# Patient Record
Sex: Female | Born: 1965 | Race: White | Hispanic: No | Marital: Married | Smoking: Never smoker
Health system: Southern US, Community
[De-identification: ages and names within clinical notes are randomized; demographics above are authoritative.]

## PROBLEM LIST (undated history)

## (undated) DIAGNOSIS — K219 Gastro-esophageal reflux disease without esophagitis: Secondary | ICD-10-CM

## (undated) DIAGNOSIS — I10 Essential (primary) hypertension: Secondary | ICD-10-CM

## (undated) DIAGNOSIS — I219 Acute myocardial infarction, unspecified: Secondary | ICD-10-CM

## (undated) DIAGNOSIS — E039 Hypothyroidism, unspecified: Secondary | ICD-10-CM

## (undated) DIAGNOSIS — E079 Disorder of thyroid, unspecified: Secondary | ICD-10-CM

## (undated) DIAGNOSIS — C801 Malignant (primary) neoplasm, unspecified: Secondary | ICD-10-CM

## (undated) DIAGNOSIS — I2510 Atherosclerotic heart disease of native coronary artery without angina pectoris: Secondary | ICD-10-CM

## (undated) HISTORY — DX: Acute myocardial infarction, unspecified: I21.9

## (undated) HISTORY — PX: CARDIAC CATHETERIZATION: SHX172

## (undated) HISTORY — PX: TONSILLECTOMY: SUR1361

## (undated) HISTORY — DX: Gastro-esophageal reflux disease without esophagitis: K21.9

## (undated) HISTORY — DX: Atherosclerotic heart disease of native coronary artery without angina pectoris: I25.10

## (undated) HISTORY — DX: Essential (primary) hypertension: I10

## (undated) MED FILL — Fosaprepitant Dimeglumine For IV Infusion 150 MG (Base Eq): INTRAVENOUS | Qty: 5 | Status: AC

---

## 2019-07-21 ENCOUNTER — Encounter: Payer: Self-pay | Admitting: Internal Medicine

## 2019-07-23 ENCOUNTER — Emergency Department: Payer: 59

## 2019-07-23 ENCOUNTER — Encounter: Payer: Self-pay | Admitting: Emergency Medicine

## 2019-07-23 ENCOUNTER — Other Ambulatory Visit: Payer: Self-pay

## 2019-07-23 ENCOUNTER — Inpatient Hospital Stay
Admission: EM | Admit: 2019-07-23 | Discharge: 2019-07-25 | DRG: 247 | Disposition: A | Discharge status: Home / Self Care | Payer: 59 | Attending: Internal Medicine | Admitting: Internal Medicine

## 2019-07-23 DIAGNOSIS — I214 Non-ST elevation (NSTEMI) myocardial infarction: Secondary | ICD-10-CM | POA: Diagnosis present

## 2019-07-23 DIAGNOSIS — E119 Type 2 diabetes mellitus without complications: Secondary | ICD-10-CM | POA: Diagnosis present

## 2019-07-23 DIAGNOSIS — I255 Ischemic cardiomyopathy: Secondary | ICD-10-CM | POA: Diagnosis present

## 2019-07-23 DIAGNOSIS — R7989 Other specified abnormal findings of blood chemistry: Secondary | ICD-10-CM | POA: Diagnosis not present

## 2019-07-23 DIAGNOSIS — Z713 Dietary counseling and surveillance: Secondary | ICD-10-CM | POA: Diagnosis not present

## 2019-07-23 DIAGNOSIS — E785 Hyperlipidemia, unspecified: Secondary | ICD-10-CM | POA: Diagnosis present

## 2019-07-23 DIAGNOSIS — I10 Essential (primary) hypertension: Secondary | ICD-10-CM | POA: Diagnosis present

## 2019-07-23 DIAGNOSIS — Z20828 Contact with and (suspected) exposure to other viral communicable diseases: Secondary | ICD-10-CM | POA: Diagnosis present

## 2019-07-23 DIAGNOSIS — I2510 Atherosclerotic heart disease of native coronary artery without angina pectoris: Secondary | ICD-10-CM | POA: Diagnosis present

## 2019-07-23 DIAGNOSIS — R001 Bradycardia, unspecified: Secondary | ICD-10-CM | POA: Diagnosis not present

## 2019-07-23 DIAGNOSIS — Z6841 Body Mass Index (BMI) 40.0 and over, adult: Secondary | ICD-10-CM

## 2019-07-23 DIAGNOSIS — E039 Hypothyroidism, unspecified: Secondary | ICD-10-CM | POA: Diagnosis present

## 2019-07-23 DIAGNOSIS — Z955 Presence of coronary angioplasty implant and graft: Secondary | ICD-10-CM | POA: Diagnosis not present

## 2019-07-23 HISTORY — DX: Hypothyroidism, unspecified: E03.9

## 2019-07-23 HISTORY — DX: Disorder of thyroid, unspecified: E07.9

## 2019-07-23 LAB — GLUCOSE, CAPILLARY
Glucose-Capillary: 116 mg/dL — ABNORMAL HIGH (ref 70–99)
Glucose-Capillary: 127 mg/dL — ABNORMAL HIGH (ref 70–99)

## 2019-07-23 LAB — HIV ANTIBODY (ROUTINE TESTING W REFLEX): HIV Screen 4th Generation wRfx: NONREACTIVE

## 2019-07-23 LAB — BASIC METABOLIC PANEL
Anion gap: 9 (ref 5–15)
BUN: 10 mg/dL (ref 6–20)
CO2: 27 mmol/L (ref 22–32)
Calcium: 9.7 mg/dL (ref 8.9–10.3)
Chloride: 103 mmol/L (ref 98–111)
Creatinine, Ser: 0.78 mg/dL (ref 0.44–1.00)
GFR calc Af Amer: >60 mL/min (ref >60)
GFR calc non Af Amer: >60 mL/min (ref >60)
Glucose, Bld: 182 mg/dL — ABNORMAL HIGH (ref 70–99)
Potassium: 3.8 mmol/L (ref 3.5–5.1)
Sodium: 139 mmol/L (ref 135–145)

## 2019-07-23 LAB — CBC
HCT: 40.8 % (ref 36.0–46.0)
Hemoglobin: 14.1 g/dL (ref 12.0–15.0)
MCH: 31.1 pg (ref 26.0–34.0)
MCHC: 34.6 g/dL (ref 30.0–36.0)
MCV: 89.9 fL (ref 80.0–100.0)
Platelets: 241 10*3/uL (ref 150–400)
RBC: 4.54 MIL/uL (ref 3.87–5.11)
RDW: 13.2 % (ref 11.5–15.5)
WBC: 8.6 10*3/uL (ref 4.0–10.5)
nRBC: 0 % (ref 0.0–0.2)

## 2019-07-23 LAB — LIPID PANEL
Cholesterol: 197 mg/dL (ref 0–200)
HDL: 53 mg/dL (ref >40)
LDL Cholesterol: 122 mg/dL — ABNORMAL HIGH (ref 0–99)
Total CHOL/HDL Ratio: 3.7 RATIO
Triglycerides: 112 mg/dL (ref <150)
VLDL: 22 mg/dL (ref 0–40)

## 2019-07-23 LAB — PROTIME-INR
INR: 1 (ref 0.8–1.2)
Prothrombin Time: 12.7 seconds (ref 11.4–15.2)

## 2019-07-23 LAB — HEPARIN LEVEL (UNFRACTIONATED)
Heparin Unfractionated: 0.12 IU/mL — ABNORMAL LOW (ref 0.30–0.70)
Heparin Unfractionated: 0.21 IU/mL — ABNORMAL LOW (ref 0.30–0.70)

## 2019-07-23 LAB — POC SARS CORONAVIRUS 2 AG: SARS Coronavirus 2 Ag: NEGATIVE

## 2019-07-23 LAB — TROPONIN I (HIGH SENSITIVITY)
Troponin I (High Sensitivity): 663 ng/L (ref <18)
Troponin I (High Sensitivity): 650 ng/L (ref <18)
Troponin I (High Sensitivity): 2278 ng/L (ref <18)

## 2019-07-23 LAB — SARS CORONAVIRUS 2 BY RT PCR (HOSPITAL ORDER, PERFORMED IN ~~LOC~~ HOSPITAL LAB): SARS Coronavirus 2: NEGATIVE

## 2019-07-23 LAB — HEMOGLOBIN A1C
Hgb A1c MFr Bld: 7.7 % — ABNORMAL HIGH (ref 4.8–5.6)
Mean Plasma Glucose: 174.29 mg/dL

## 2019-07-23 LAB — TSH: TSH: 0.043 u[IU]/mL — ABNORMAL LOW (ref 0.350–4.500)

## 2019-07-23 LAB — APTT: aPTT: 33 seconds (ref 24–36)

## 2019-07-23 MED ORDER — SODIUM CHLORIDE 0.9 % IV SOLN
INTRAVENOUS | Status: DC
  Administered 2019-07-23 (×2): via INTRAVENOUS

## 2019-07-23 MED ORDER — BISACODYL 5 MG PO TBEC
5.0000 mg | DELAYED_RELEASE_TABLET | Freq: Every day | ORAL | Status: DC | PRN
  Filled 2019-07-23: qty 1

## 2019-07-23 MED ORDER — SENNOSIDES-DOCUSATE SODIUM 8.6-50 MG PO TABS
1.0000 | ORAL_TABLET | Freq: Every evening | ORAL | Status: DC | PRN
  Filled 2019-07-23: qty 1

## 2019-07-23 MED ORDER — SODIUM CHLORIDE 0.9 % IV SOLN
500.0000 mg | Freq: Once | INTRAVENOUS | Status: AC
  Administered 2019-07-23: 500 mg via INTRAVENOUS
  Filled 2019-07-23: qty 500

## 2019-07-23 MED ORDER — MORPHINE SULFATE (PF) 2 MG/ML IV SOLN
INTRAVENOUS | Status: AC
  Filled 2019-07-23: qty 1

## 2019-07-23 MED ORDER — ACETAMINOPHEN 325 MG PO TABS
650.0000 mg | ORAL_TABLET | Freq: Four times a day (QID) | ORAL | Status: DC | PRN
  Filled 2019-07-23: qty 2

## 2019-07-23 MED ORDER — INSULIN ASPART 100 UNIT/ML ~~LOC~~ SOLN
0.0000 [IU] | Freq: Three times a day (TID) | SUBCUTANEOUS | Status: DC
  Administered 2019-07-24 – 2019-07-25 (×2): 1 [IU] via SUBCUTANEOUS
  Filled 2019-07-23 (×2): qty 1

## 2019-07-23 MED ORDER — HEPARIN BOLUS VIA INFUSION
4000.0000 [IU] | Freq: Once | INTRAVENOUS | Status: AC
  Administered 2019-07-23: 4000 [IU] via INTRAVENOUS
  Filled 2019-07-23: qty 4000

## 2019-07-23 MED ORDER — HEPARIN SODIUM (PORCINE) 5000 UNIT/ML IJ SOLN: 4000.0000 [IU] | Freq: Once | INTRAMUSCULAR | Status: DC

## 2019-07-23 MED ORDER — HEPARIN BOLUS VIA INFUSION
1350.0000 [IU] | Freq: Once | INTRAVENOUS | Status: AC
  Administered 2019-07-23: 1350 [IU] via INTRAVENOUS
  Filled 2019-07-23: qty 1350

## 2019-07-23 MED ORDER — SODIUM CHLORIDE 0.9% FLUSH
3.0000 mL | Freq: Two times a day (BID) | INTRAVENOUS | Status: DC
  Administered 2019-07-24 – 2019-07-25 (×2): 3 mL via INTRAVENOUS

## 2019-07-23 MED ORDER — ONDANSETRON HCL 4 MG/2ML IJ SOLN: 4.0000 mg | Freq: Four times a day (QID) | INTRAMUSCULAR | Status: DC | PRN

## 2019-07-23 MED ORDER — POLYETHYLENE GLYCOL 3350 17 G PO PACK
17.0000 g | PACK | Freq: Every day | ORAL | Status: DC | PRN
  Filled 2019-07-23: qty 1

## 2019-07-23 MED ORDER — HEPARIN BOLUS VIA INFUSION
2500.0000 [IU] | Freq: Once | INTRAVENOUS | Status: AC
  Administered 2019-07-23: 2500 [IU] via INTRAVENOUS
  Filled 2019-07-23: qty 2500

## 2019-07-23 MED ORDER — SODIUM CHLORIDE 0.9 % IV SOLN
1.0000 g | Freq: Once | INTRAVENOUS | Status: AC
  Administered 2019-07-23: 1 g via INTRAVENOUS
  Filled 2019-07-23: qty 10

## 2019-07-23 MED ORDER — HYDROCODONE-ACETAMINOPHEN 5-325 MG PO TABS: 1.0000 | ORAL_TABLET | ORAL | Status: DC | PRN

## 2019-07-23 MED ORDER — IPRATROPIUM-ALBUTEROL 0.5-2.5 (3) MG/3ML IN SOLN: 3.0000 mL | RESPIRATORY_TRACT | Status: DC | PRN

## 2019-07-23 MED ORDER — MORPHINE SULFATE (PF) 2 MG/ML IV SOLN
2.0000 mg | Freq: Once | INTRAVENOUS | Status: AC
  Administered 2019-07-23: 2 mg via INTRAVENOUS

## 2019-07-23 MED ORDER — HEPARIN (PORCINE) 25000 UT/250ML-% IV SOLN: 14.0000 [IU]/kg/h | INTRAVENOUS | Status: DC

## 2019-07-23 MED ORDER — HEPARIN (PORCINE) 25000 UT/250ML-% IV SOLN
INTRAVENOUS | Status: AC
  Filled 2019-07-23: qty 250

## 2019-07-23 MED ORDER — ASPIRIN 81 MG PO CHEW
CHEWABLE_TABLET | ORAL | Status: AC
  Administered 2019-07-23: 05:00:00 162 mg
  Filled 2019-07-23: qty 2

## 2019-07-23 MED ORDER — ONDANSETRON HCL 4 MG PO TABS
4.0000 mg | ORAL_TABLET | Freq: Four times a day (QID) | ORAL | Status: DC | PRN
  Filled 2019-07-23: qty 1

## 2019-07-23 MED ORDER — ASPIRIN 81 MG PO CHEW
81.0000 mg | CHEWABLE_TABLET | ORAL | Status: AC
  Administered 2019-07-24: 06:00:00 81 mg via ORAL
  Filled 2019-07-23: qty 1

## 2019-07-23 MED ORDER — ACETAMINOPHEN 650 MG RE SUPP: 650.0000 mg | Freq: Four times a day (QID) | RECTAL | Status: DC | PRN

## 2019-07-23 MED ORDER — AMLODIPINE BESYLATE 5 MG PO TABS
5.0000 mg | ORAL_TABLET | Freq: Once | ORAL | Status: AC
  Administered 2019-07-23: 5 mg via ORAL
  Filled 2019-07-23: qty 1

## 2019-07-23 MED ORDER — INSULIN ASPART 100 UNIT/ML ~~LOC~~ SOLN
2.0000 [IU] | Freq: Three times a day (TID) | SUBCUTANEOUS | Status: DC
  Administered 2019-07-25 (×2): 2 [IU] via SUBCUTANEOUS
  Filled 2019-07-23 (×2): qty 1

## 2019-07-23 MED ORDER — SODIUM CHLORIDE 0.9 % IV SOLN: 250.0000 mL | INTRAVENOUS | Status: DC | PRN

## 2019-07-23 MED ORDER — INSULIN ASPART 100 UNIT/ML ~~LOC~~ SOLN: 0.0000 [IU] | Freq: Every day | SUBCUTANEOUS | Status: DC

## 2019-07-23 MED ORDER — HEPARIN (PORCINE) 25000 UT/250ML-% IV SOLN
1550.0000 [IU]/h | INTRAVENOUS | Status: DC
  Administered 2019-07-23: 1300 [IU]/h via INTRAVENOUS
  Administered 2019-07-23: 1100 [IU]/h via INTRAVENOUS
  Filled 2019-07-23 (×2): qty 250

## 2019-07-23 NOTE — Progress Notes (Signed)
Trops up to 2.2K. Patient is chest pain free at the moment. Notified Cardiology.   Plans for [facility] tomorrow given her symptomatology, ekg and lab work.   Will continue to monitor for now.   Gerlean Ren MD

## 2019-07-23 NOTE — Progress Notes (Signed)
ANTICOAGULATION CONSULT NOTE - Initial Consult  Pharmacy Consult for Heparin Indication: chest pain/ACS  No Known Allergies  Patient Measurements: Height: 5\' 4"  (162.6 cm) Weight: (!) 313 lb 0.9 oz (142 kg) IBW/kg (Calculated) : 54.7 HEPARIN DW (KG): 90.5  Vital Signs: Temp: 99 F (37.2 C) (11/30 0431) Temp Source: Oral (11/30 0431) BP: 161/73 (11/30 0431) Pulse Rate: 87 (11/30 0431)  Labs: Recent Labs    07/23/19 0432  HGB 14.1  HCT 40.8  PLT 241  CREATININE 0.78  TROPONINIHS 663*   Estimated Creatinine Clearance: 113.7 mL/min (by C-G formula based on SCr of 0.78 mg/dL).  Medical History: Past Medical History:  Diagnosis Date  . Thyroid disease    Medications:  (Not in a hospital admission)   Assessment: Pharmacy asked to initiate and manage Heparin for ACS.  Baseline labs ordered.  No PTA med list available.   Goal of Therapy:  Heparin level 0.3-0.7 units/ml Monitor platelets by anticoagulation protocol: Yes   Plan:  Heparin 4000 units IV bolus x 1 then infusion at 1100 units/hr Check HL in 6 hours  Hart Robinsons A 07/23/2019,5:33 AM

## 2019-07-23 NOTE — ED Notes (Signed)
Monitor SR, states pain decreasing, up to bedside commode, few steps taken without distress.

## 2019-07-23 NOTE — Progress Notes (Signed)
ANTICOAGULATION CONSULT NOTE  Pharmacy Consult for Heparin Indication: chest pain/ACS  No Known Allergies  Patient Measurements: Height: 5\' 4"  (162.6 cm) Weight: (!) 313 lb 0.9 oz (142 kg) IBW/kg (Calculated) : 54.7 HEPARIN DW (KG): 90.5  Vital Signs: Temp: 98.8 F (37.1 C) (11/30 1953) Temp Source: Oral (11/30 1953) BP: 139/54 (11/30 1953) Pulse Rate: 68 (11/30 1953)  Labs: Recent Labs    07/23/19 0432 07/23/19 0532 07/23/19 0628 07/23/19 1251 07/23/19 2159  HGB 14.1  --   --   --   --   HCT 40.8  --   --   --   --   PLT 241  --   --   --   --   APTT  --  33  --   --   --   LABPROT  --  12.7  --   --   --   INR  --  1.0  --   --   --   HEPARINUNFRC  --   --   --  0.12* 0.21*  CREATININE 0.78  --   --   --   --   TROPONINIHS 663*  --  650* 2,278*  --    Estimated Creatinine Clearance: 113.7 mL/min (by C-G formula based on SCr of 0.78 mg/dL).  Medical History: Past Medical History:  Diagnosis Date  . Hypothyroidism   . Thyroid disease    Medications:  Medications Prior to Admission  Medication Sig Dispense Refill Last Dose  . ARMOUR THYROID 180 MG tablet Take 180 mg by mouth daily.   07/22/2019 at Unknown time  . azithromycin (ZITHROMAX) 250 MG tablet Take 250-500 mg by mouth as directed.   07/22/2019 at Unknown time    Assessment: Pharmacy asked to initiate and manage Heparin for ACS.  Goal of Therapy:  Heparin level 0.3-0.7 units/ml Monitor platelets by anticoagulation protocol: Yes   Plan:  11/30 1251 HL 0.12, subtherapeutic. Heparin 2500 unit bolus followed by increase in heparin drip to 1300 units/hr. HL ordered at 2200. CBC daily.  11/30:  HL @ 2159 = 0.21 Will order Heparin 1350 units IV X 1 bolus and increase drip rate to 1450 units/hr. Will recheck HL 6 hrs after rate change.   Amira Podolak D, PharmD 07/23/2019,10:58 PM

## 2019-07-23 NOTE — ED Provider Notes (Signed)
[facility] Emergency Department Provider Note   First MD Initiated Contact with Patient 07/23/19 0515     (approximate)  I have reviewed the triage vital signs and the nursing notes.   HISTORY  Chief Complaint Chest Pain   HPI Tonya [name] is a 53 y.o. female presents to the emergency department with 8 out of 10 chest pain with radiation to the left arm and neck which patient states awoke her from sleep 1 hour ago.  Patient also admits to dyspnea.  Patient denies any nausea or vomiting or diaphoresis.  Patient states that she did have an episode on Thanksgiving day of chest pain that was similar to today's however that episode spontaneously resolved.  This pain has been persistent since onset.       Past Medical History:  Diagnosis Date   Thyroid disease     There are no active problems to display for this patient.   Past Surgical History:  Procedure Laterality Date   CESAREAN SECTION      Prior to Admission medications   Not on File    Allergies Patient has no known allergies.  No family history on file.  Social History Social History   Tobacco Use   Smoking status: Never Smoker   Smokeless tobacco: Never Used  Substance Use Topics   Alcohol use: Yes    Comment: occ   Drug use: Never    Review of Systems Constitutional: No fever/chills Eyes: No visual changes. ENT: No sore throat. Cardiovascular: Positive for chest pain. Respiratory: Denies shortness of breath. Gastrointestinal: No abdominal pain.  No nausea, no vomiting.  No diarrhea.  No constipation. Genitourinary: Negative for dysuria. Musculoskeletal: Negative for neck pain.  Negative for back pain. Integumentary: Negative for rash. Neurological: Negative for headaches, focal weakness or numbness.   ____________________________________________   PHYSICAL EXAM:  VITAL SIGNS: ED Triage Vitals  Enc Vitals Group     BP 07/23/19 0431 (!) 161/73     Pulse  Rate 07/23/19 0431 87     Resp 07/23/19 0431 20     Temp 07/23/19 0431 99 F (37.2 C)     Temp Source 07/23/19 0431 Oral     SpO2 07/23/19 0431 97 %     Weight 07/23/19 0425 (!) 142 kg (313 lb 0.9 oz)     Height 07/23/19 0425 1.626 m (5\' 4" )     Head Circumference --      Peak Flow --      Pain Score 07/23/19 0425 7     Pain Loc --      Pain Edu? --      Excl. in [place]? --     Constitutional: Alert and oriented.  Eyes: Conjunctivae are normal.  Mouth/Throat: Patient is wearing a mask. Neck: No stridor.  No meningeal signs.   Cardiovascular: Normal rate, regular rhythm. Good peripheral circulation. Grossly normal heart sounds. Respiratory: Normal respiratory effort.  No retractions. Gastrointestinal: Soft and nontender. No distention.  Musculoskeletal: No lower extremity tenderness nor edema. No gross deformities of extremities. Neurologic:  Normal speech and language. No gross focal neurologic deficits are appreciated.  Skin:  Skin is warm, dry and intact. Psychiatric: Mood and affect are normal. Speech and behavior are normal.  ____________________________________________   LABS (all labs ordered are listed, but only abnormal results are displayed)  Labs Reviewed  BASIC METABOLIC PANEL - Abnormal; Notable for the following components:      Result Value   Glucose, Bld  182 (*)    All other components within normal limits  TROPONIN I (HIGH SENSITIVITY) - Abnormal; Notable for the following components:   Troponin I (High Sensitivity) 663 (*)    All other components within normal limits  SARS CORONAVIRUS 2 BY RT PCR (HOSPITAL ORDER, PERFORMED IN [facility])  CBC  APTT  PROTIME-INR  HEPARIN LEVEL (UNFRACTIONATED)  POC SARS CORONAVIRUS 2 AG -  ED  POC SARS CORONAVIRUS 2 AG  POC URINE PREG, ED  TROPONIN I (HIGH SENSITIVITY)   ____________________________________________  EKG  ED ECG REPORT I, [place] N Javier Mamone, the attending physician, personally viewed and  interpreted this ECG.   Date: 07/23/2019  EKG Time: 4:28 AM  Rate: 100  Rhythm: Normal sinus rhythm  Axis: Normal  Intervals: Normal  ST&T Change: None  ____________________________________________  RADIOLOGY I, Ryan N Dorsey Authement, personally viewed and evaluated these images (plain radiographs) as part of my medical decision making, as well as reviewing the written report by the radiologist.  ED MD interpretation: Lingular airspace disease compatible with pneumonia mild patchy opacities right middle lower lobe  Official radiology report(s): Dg Chest 2 View  Result Date: 07/23/2019 CLINICAL DATA:  Chest pain. EXAM: CHEST - 2 VIEW COMPARISON:  None. FINDINGS: The heart size is exaggerated by low lung volumes. Lingular airspace disease is present. Minimal patchy opacities are present in the right middle or lower lobe as well. The upper lung fields are clear. Degenerative changes are noted at the shoulders bilaterally. IMPRESSION: 1. Lingular airspace disease compatible with pneumonia. 2. Minimal patchy opacities in the right middle or lower lobe likely represent atelectasis. Infection is not excluded. 3. Low lung volumes. Electronically Signed   By: Marin Roberts M.D.   On: 07/23/2019 05:43     Procedures   ____________________________________________   INITIAL IMPRESSION / MDM / ASSESSMENT AND PLAN / ED COURSE  As part of my medical decision making, I reviewed the following data within the electronic MEDICAL RECORD NUMBER   53 year old female presented with above-stated history and physical exam concerning for possible CAD/MI/pulmonary emboli.  EKG revealed no evidence of ischemia or infarction.  Laboratory data notable for troponin of 663.  Chest x-ray findings compatible with possible pneumonia.  Patient given IV ceftriaxone and azithromycin.  Patient discussed with Dr. Stephania Fragmin for hospital admission for further evaluation and management.   Clinical Course as of Jul 22 717    [facility] Jul 23, 2019  0715 DG Chest 2 View [RB]    Clinical Course User Index [RB] Darci Current, MD     ____________________________________________  FINAL CLINICAL IMPRESSION(S) / ED DIAGNOSES  Final diagnoses:  NSTEMI (non-ST elevated myocardial infarction) [facility])     MEDICATIONS GIVEN DURING THIS VISIT:  Medications  heparin bolus via infusion 4,000 Units (4,000 Units Intravenous Bolus from Bag 07/23/19 0555)    Followed by  heparin ADULT infusion 100 units/mL (25000 units/224mL sodium chloride 0.45%) (1,100 Units/hr Intravenous New Bag/Given 07/23/19 0600)  aspirin 81 MG chewable tablet (162 mg  Given 07/23/19 0527)  morphine 2 MG/ML injection 2 mg (2 mg Intravenous Given 07/23/19 0550)     ED Discharge Orders    None      *Please note:  Tonya [name] was evaluated in Emergency Department on 07/23/2019 for the symptoms described in the history of present illness. She was evaluated in the context of the global COVID-19 pandemic, which necessitated consideration that the patient might be at risk for infection with the SARS-CoV-2  virus that causes COVID-19. Institutional protocols and algorithms that pertain to the evaluation of patients at risk for COVID-19 are in a state of rapid change based on information released by regulatory bodies including the CDC and federal and state organizations. These policies and algorithms were followed during the patient's care in the ED.  Some ED evaluations and interventions may be delayed as a result of limited staffing during the pandemic.*  Note:  This document was prepared using Dragon voice recognition software and may include unintentional dictation errors.   Darci CurrentBrown, Gandy N, MD 07/23/19 [phone]

## 2019-07-23 NOTE — H&P (Signed)
History and Physical    Kaitelyn Jamison WCH:[id number] DOB: [birth year] DOA: 07/23/2019  [facility] Patient coming from: Home  Chief Complaint: Chest pain  HPI: Laneisha Mino is a 53 y.o. female with medical history significant hypothyroidism comes to the hospital with complains of chest pain.  Patient states over the Thanksgiving she had substernal burning-like chest pain going across her chest lasting for several hours.  She took over-the-counter NSAIDs and then aspirin to help with it.  Later in the evening it subsided.  But this morning around 3 AM she woke up with more severe substernal chest pain radiating to her arms along with feeling of nausea and shortness of breath.  Due to the severity of pain she decided to come to the ER. Denies having any obvious motor pain in the past.  In the ER her EKG was slightly abnormal where it showed ST changes in the lateral and posterior leads.  Troponin was elevated at 660.  Case was discussed by ER provider with cardiology who recommended admitting the patient on heparin drip and further care. COVID-19-negative  Patient also tells me she was seen at [facility] earlier this week for routine checkup and was told she may have congestion in her chest therefore she was placed on Z-Pak.   Review of Systems: As per HPI otherwise 10 point review of systems negative.  Review of Systems Otherwise negative except as per HPI, including: General: Denies fever, chills, night sweats or unintended weight loss. Resp: Denies cough, wheezing Cardiac: Denies  palpitations, orthopnea, paroxysmal nocturnal dyspnea. GI: Denies abdominal pain, nausea, vomiting, diarrhea or constipation GU: Denies dysuria, frequency, hesitancy or incontinence MS: Denies muscle aches, joint pain or swelling Neuro: Denies headache, neurologic deficits (focal weakness, numbness, tingling), abnormal gait Psych: Denies anxiety, depression, SI/HI/AVH Skin: Denies  new rashes or lesions ID: Denies sick contacts, exotic exposures, travel  Past Medical History:  Diagnosis Date   Thyroid disease     Past Surgical History:  Procedure Laterality Date   CESAREAN SECTION      SOCIAL HISTORY:  reports that she has never smoked. She has never used smokeless tobacco. She reports current alcohol use. She reports that she does not use drugs.  No Known Allergies  FAMILY HISTORY: No family history on file.   Prior to Admission medications   Not on File    Physical Exam: Vitals:   07/23/19 0425 07/23/19 0431 07/23/19 0600 07/23/19 0712  BP:  (!) 161/73  (!) 159/72  Pulse:  87 78 68  Resp:  20 14 18   Temp:  99 F (37.2 C)    TempSrc:  Oral    SpO2:  97% 95% 97%  Weight: (!) 142 kg     Height: 5\' 4"  (1.626 m)         Constitutional: NAD, calm, comfortable Eyes: PERRL, lids and conjunctivae normal ENMT: Mucous membranes are moist. Posterior pharynx clear of any exudate or lesions.Normal dentition.  Neck: normal, supple, no masses, no thyromegaly Respiratory: clear to auscultation bilaterally, no wheezing, no crackles. Normal respiratory effort. No accessory muscle use.  Cardiovascular: Regular rate and rhythm, no murmurs / rubs / gallops. No extremity edema. 2+ pedal pulses. No carotid bruits.  Abdomen: no tenderness, no masses palpated. No hepatosplenomegaly. Bowel sounds positive.  Musculoskeletal: no clubbing / cyanosis. No joint deformity upper and lower extremities. Good ROM, no contractures. Normal muscle tone.  Skin: no rashes, lesions, ulcers. No induration Neurologic: CN 2-12 grossly intact. Sensation  intact, DTR normal. Strength 5/5 in all 4.  Psychiatric: Normal judgment and insight. Alert and oriented x 3. Normal mood.     Labs on Admission: I have personally reviewed following labs and imaging studies  CBC: Recent Labs  Lab 07/23/19 0432  WBC 8.6  HGB 14.1  HCT 40.8  MCV 89.9  PLT 241   Basic Metabolic  Panel: Recent Labs  Lab 07/23/19 0432  NA 139  K 3.8  CL 103  CO2 27  GLUCOSE 182*  BUN 10  CREATININE 0.78  CALCIUM 9.7   GFR: Estimated Creatinine Clearance: 113.7 mL/min (by C-G formula based on SCr of 0.78 mg/dL). Liver Function Tests: No results for input(s): AST, ALT, ALKPHOS, BILITOT, PROT, ALBUMIN in the last 168 hours. No results for input(s): LIPASE, AMYLASE in the last 168 hours. No results for input(s): AMMONIA in the last 168 hours. Coagulation Profile: Recent Labs  Lab 07/23/19 0532  INR 1.0   Cardiac Enzymes: No results for input(s): CKTOTAL, CKMB, CKMBINDEX, TROPONINI in the last 168 hours. BNP (last 3 results) No results for input(s): PROBNP in the last 8760 hours. HbA1C: No results for input(s): HGBA1C in the last 72 hours. CBG: No results for input(s): GLUCAP in the last 168 hours. Lipid Profile: No results for input(s): CHOL, HDL, LDLCALC, TRIG, CHOLHDL, LDLDIRECT in the last 72 hours. Thyroid Function Tests: No results for input(s): TSH, T4TOTAL, FREET4, T3FREE, THYROIDAB in the last 72 hours. Anemia Panel: No results for input(s): VITAMINB12, FOLATE, FERRITIN, TIBC, IRON, RETICCTPCT in the last 72 hours. Urine analysis: No results found for: COLORURINE, APPEARANCEUR, LABSPEC, PHURINE, GLUCOSEU, HGBUR, BILIRUBINUR, KETONESUR, PROTEINUR, UROBILINOGEN, NITRITE, LEUKOCYTESUR Sepsis Labs: !!!!!!!!!!!!!!!!!!!!!!!!!!!!!!!!!!!!!!!!!!!! @LABRCNTIP (procalcitonin:4,lacticidven:4) ) Recent Results (from the past 240 hour(s))  SARS Coronavirus 2 by RT PCR (hospital order, performed in [facility] lab) Nasopharyngeal Nasopharyngeal Swab     Status: None   Collection Time: 07/23/19  5:35 AM   Specimen: Nasopharyngeal Swab  Result Value Ref Range Status   SARS Coronavirus 2 NEGATIVE NEGATIVE Final    Comment: (NOTE) SARS-CoV-2 target nucleic acids are NOT DETECTED. The SARS-CoV-2 RNA is generally detectable in upper and lower respiratory specimens  during the acute phase of infection. The lowest concentration of SARS-CoV-2 viral copies this assay can detect is 250 copies / mL. A negative result does not preclude SARS-CoV-2 infection and should not be used as the sole basis for treatment or other patient management decisions.  A negative result may occur with improper specimen collection / handling, submission of specimen other than nasopharyngeal swab, presence of viral mutation(s) within the areas targeted by this assay, and inadequate number of viral copies (<250 copies / mL). A negative result must be combined with clinical observations, patient history, and epidemiological information. Fact Sheet for Patients:   BoilerBrush.com.cyhttps://www.fda.gov/media/136312/download Fact Sheet for Healthcare Providers: https://pope.com/https://www.fda.gov/media/136313/download This test is not yet approved or cleared  by the [place] FDA and has been authorized for detection and/or diagnosis of SARS-CoV-2 by FDA under an Emergency Use Authorization (EUA).  This EUA will remain in effect (meaning this test can be used) for the duration of the COVID-19 declaration under Section 564(b)(1) of the Act, 21 U.S.C. section 360bbb-3(b)(1), unless the authorization is terminated or revoked sooner. Performed at [facility], [address] [place], [place] [address]      Radiological Exams on Admission: Dg Chest 2 View  Result Date: 07/23/2019 CLINICAL DATA:  Chest pain. EXAM: CHEST - 2 VIEW COMPARISON:  None. FINDINGS: The  heart size is exaggerated by low lung volumes. Lingular airspace disease is present. Minimal patchy opacities are present in the right middle or lower lobe as well. The upper lung fields are clear. Degenerative changes are noted at the shoulders bilaterally. IMPRESSION: 1. Lingular airspace disease compatible with pneumonia. 2. Minimal patchy opacities in the right middle or lower lobe likely represent atelectasis. Infection is not excluded. 3.  Low lung volumes. Electronically Signed   By: San Morelle M.D.   On: 07/23/2019 05:43     All images have been reviewed by me personally.  EKG: Independently reviewed.  Posterior lateral lead T wave changes  Assessment/Plan Active Problems:   NSTEMI (non-ST elevated myocardial infarction) (HCC)   Hypothyroidism (acquired)    Substernal chest pain concerning for ACS Non-STEMI -Admit the patient for chest pain workup to telemetry -Aspirin given, now on heparin drip - EKG shows-ST changes in lateral and posterior leads -Initial troponin around 650, trend cardiac enzymes, check TSH, A1c and lipid panel for risk stratification - Consult cardiology if necessary -Supplemental oxygen as necessary -Workup-possible left heart catheterization -Nitro if necessary for chest pain -Would benefit from outpatient sleep study  Hypothyroidism -Continue Synthroid once confirmed by pharmacy   DVT prophylaxis: Heparin drip Code Status: Full code Family Communication: None Disposition Plan: To be determined Consults called: Cardiology Admission status: Inpatient admission to telemetry for non-STEMI   Time Spent: 65 minutes.  >50% of the time was devoted to discussing the patients care, assessment, plan and disposition with other care givers along with counseling the patient about the risks and benefits of treatment.    Haizley Cannella Arsenio Loader MD [facility]  If 7PM-7AM, please contact night-coverage   07/23/2019, 9:05 AM

## 2019-07-23 NOTE — ED Triage Notes (Signed)
Patient states that she woke up about an hour ago with pressure on her chest radiating down her left arm and into her jaw. Patient states that she feels like she can not take a deep breath. Patient denies nausea and vomiting.

## 2019-07-23 NOTE — Consult Note (Signed)
[facility] Cardiology Consultation Note  Patient ID: Tonya [name], MRN: [id number], DOB/AGE: Sep 20, 1965 53 y.o. Admit date: 07/23/2019   Date of Consult: 07/23/2019 Primary Physician: [facility] Primary Cardiologist: None  Chief Complaint:  Chief Complaint  Patient presents with  . Chest Pain   Reason for Consult: Chest pain  HPI: 53 y.o. female with borderline hypertension hyperlipidemia who has had intermittent episodes of chest discomfort at rest occurring at different times but recently have awakened her from sleep.  This chest pain was substernal in nature and radiated to both arms but mainly to her left arm and lasting for several hours.  It did relieved somewhat spontaneously but has also relieved by some nitroglycerin.  The patient does not have any significant medical history suggesting high risk at this time.  She does have some thyroid disease.  Currently she is pain-free.  EKG has shown normal sinus rhythm with lateral and posterior lateral ST changes consistent with possible myocardial ischemia not changed throughout her chest pain but..  The patient has a troponin of 663 and 650 and following closely for any other changes.  Chest x-ray does not show any significant congestive heart failure.  Past Medical History:  Diagnosis Date  . Thyroid disease       Surgical History:  Past Surgical History:  Procedure Laterality Date  . CESAREAN SECTION       Home Meds: Prior to Admission medications   Not on File    Inpatient Medications:   . sodium chloride    . azithromycin (ZITHROMAX) 500 MG IVPB (Vial-Mate [employer])    . cefTRIAXone (ROCEPHIN)  IV    . heparin 1,100 Units/hr (07/23/19 0600)    Allergies: No Known Allergies  Social History   Socioeconomic History  . Marital status: Married    Spouse name: Not on file  . Number of children: Not on file  . Years of education: Not on file  . Highest education level: Not on file  Occupational  History  . Not on file  Social Needs  . Financial resource strain: Not on file  . Food insecurity    Worry: Not on file    Inability: Not on file  . Transportation needs    Medical: Not on file    Non-medical: Not on file  Tobacco Use  . Smoking status: Never Smoker  . Smokeless tobacco: Never Used  Substance and Sexual Activity  . Alcohol use: Yes    Comment: occ  . Drug use: Never  . Sexual activity: Not on file  Lifestyle  . Physical activity    Days per week: Not on file    Minutes per session: Not on file  . Stress: Not on file  Relationships  . Social Herbalist on phone: Not on file    Gets together: Not on file    Attends religious service: Not on file    Active member of club or organization: Not on file    Attends meetings of clubs or organizations: Not on file    Relationship status: Not on file  . Intimate partner violence    Fear of current or ex partner: Not on file    Emotionally abused: Not on file    Physically abused: Not on file    Forced sexual activity: Not on file  Other Topics Concern  . Not on file  Social History Narrative  . Not on file     No family history  on file.   Review of Systems Positive for chest pain Negative for: General:  chills, fever, night sweats or weight changes.  Cardiovascular: PND orthopnea syncope dizziness  Dermatological skin lesions rashes Respiratory: Cough congestion Urologic: Frequent urination urination at night and hematuria Abdominal: negative for nausea, vomiting, diarrhea, bright red blood per rectum, melena, or hematemesis Neurologic: negative for visual changes, and/or hearing changes  All other systems reviewed and are otherwise negative except as noted above.  Labs: No results for input(s): CKTOTAL, CKMB, TROPONINI in the last 72 hours. Lab Results  Component Value Date   WBC 8.6 07/23/2019   HGB 14.1 07/23/2019   HCT 40.8 07/23/2019   MCV 89.9 07/23/2019   PLT 241 07/23/2019     Recent Labs  Lab 07/23/19 0432  NA 139  K 3.8  CL 103  CO2 27  BUN 10  CREATININE 0.78  CALCIUM 9.7  GLUCOSE 182*   No results found for: CHOL, HDL, LDLCALC, TRIG No results found for: DDIMER  Radiology/Studies:  Dg Chest 2 View  Result Date: 07/23/2019 CLINICAL DATA:  Chest pain. EXAM: CHEST - 2 VIEW COMPARISON:  None. FINDINGS: The heart size is exaggerated by low lung volumes. Lingular airspace disease is present. Minimal patchy opacities are present in the right middle or lower lobe as well. The upper lung fields are clear. Degenerative changes are noted at the shoulders bilaterally. IMPRESSION: 1. Lingular airspace disease compatible with pneumonia. 2. Minimal patchy opacities in the right middle or lower lobe likely represent atelectasis. Infection is not excluded. 3. Low lung volumes. Electronically Signed   By: Marin Robertshristopher  Mattern M.D.   On: 07/23/2019 05:43    EKG: Normal sinus rhythm with posterior lateral ST changes consistent with possible ischemia  Weights: Filed Weights   07/23/19 0425  Weight: (!) 142 kg     Physical Exam: Blood pressure (!) 159/72, pulse 68, temperature 99 F (37.2 C), temperature source Oral, resp. rate 18, height 5\' 4"  (1.626 m), weight (!) 142 kg, SpO2 97 %. Body mass index is 53.74 kg/m. General: Well developed, well nourished, in no acute distress. Head eyes ears nose throat: Normocephalic, atraumatic, sclera non-icteric, no xanthomas, nares are without discharge. No apparent thyromegaly and/or mass  Lungs: Normal respiratory effort.  no wheezes, no rales, no rhonchi.  Heart: RRR with normal S1 S2. no murmur gallop, no rub, PMI is normal size and placement, carotid upstroke normal without bruit, jugular venous pressure is normal Abdomen: Soft, non-tender, non-distended with normoactive bowel sounds. No hepatomegaly. No rebound/guarding. No obvious abdominal masses. Abdominal aorta is normal size without bruit Extremities: No edema. no  cyanosis, no clubbing, no ulcers  Peripheral : 2+ bilateral upper extremity pulses, 2+ bilateral femoral pulses, 2+ bilateral dorsal pedal pulse Neuro: Alert and oriented. No facial asymmetry. No focal deficit. Moves all extremities spontaneously. Musculoskeletal: Normal muscle tone without kyphosis Psych:  Responds to questions appropriately with a normal affect.    Assessment: 53 year old female with borderline hypertension hyperlipidemia with chest pain abnormal EKG and elevated troponin possibly consistent with acute coronary syndrome without evidence of heart failure  Plan: 1.  Continue supportive care including nitrates aspirin for chest discomfort 2.  Heparin for further possibility of cardiovascular risk and myocardial infarction 3.  Hypertension control with beta-blocker if able 4.  Possible proceeding to cardiac catheterization to assess coronary anatomy and further treatment thereof is necessary.  Patient understands the risk and benefits of cardiac catheterization.  This includes the possibility of death stroke  heart attack infection bleeding or blood clot.  She is at low risk for conscious sedation  Signed, Lamar Blinks M.D. [facility] Cardiology 07/23/2019, 8:09 AM

## 2019-07-23 NOTE — Progress Notes (Signed)
Pt. Stated: "I wonder if I can just go home and come back tomorrow for the cardiac cath?" Explained to patient her diagnosis, trop results, and heparing drip. Pt. Voices understanding.

## 2019-07-23 NOTE — ED Notes (Signed)
Date and time results received: 07/23/19 05:03 (use smartphrase ".now" to insert current time)  Test: troponin Critical Value: 663  Name of Provider Notified: Dr. Owens Shark   Orders Received? Or Actions Taken?: acknowledged

## 2019-07-23 NOTE — Progress Notes (Signed)
ANTICOAGULATION CONSULT NOTE  Pharmacy Consult for Heparin Indication: chest pain/ACS  No Known Allergies  Patient Measurements: Height: 5\' 4"  (162.6 cm) Weight: (!) 313 lb 0.9 oz (142 kg) IBW/kg (Calculated) : 54.7 HEPARIN DW (KG): 90.5  Vital Signs: Temp: 98.4 F (36.9 C) (11/30 1312) Temp Source: Axillary (11/30 1312) BP: 130/73 (11/30 1312) Pulse Rate: 62 (11/30 1312)  Labs: Recent Labs    07/23/19 0432 07/23/19 0532 07/23/19 0628 07/23/19 1251  HGB 14.1  --   --   --   HCT 40.8  --   --   --   PLT 241  --   --   --   APTT  --  33  --   --   LABPROT  --  12.7  --   --   INR  --  1.0  --   --   HEPARINUNFRC  --   --   --  0.12*  CREATININE 0.78  --   --   --   TROPONINIHS 663*  --  650*  --    Estimated Creatinine Clearance: 113.7 mL/min (by C-G formula based on SCr of 0.78 mg/dL).  Medical History: Past Medical History:  Diagnosis Date  . Hypothyroidism   . Thyroid disease    Medications:  Medications Prior to Admission  Medication Sig Dispense Refill Last Dose  . ARMOUR THYROID 180 MG tablet Take 180 mg by mouth daily.   07/22/2019 at Unknown time  . azithromycin (ZITHROMAX) 250 MG tablet Take 250-500 mg by mouth as directed.   07/22/2019 at Unknown time    Assessment: Pharmacy asked to initiate and manage Heparin for ACS.  Goal of Therapy:  Heparin level 0.3-0.7 units/ml Monitor platelets by anticoagulation protocol: Yes   Plan:  11/30 1251 HL 0.12, subtherapeutic. Heparin 2500 unit bolus followed by increase in heparin drip to 1300 units/hr. HL ordered at 2200. CBC daily.  Tawnya Crook, PharmD 07/23/2019,2:43 PM

## 2019-07-23 NOTE — ED Notes (Signed)
BLUE top collected.

## 2019-07-24 ENCOUNTER — Other Ambulatory Visit: Payer: Self-pay

## 2019-07-24 ENCOUNTER — Inpatient Hospital Stay
Admit: 2019-07-24 | Discharge: 2019-07-24 | Disposition: A | Discharge status: Home / Self Care | Payer: 59 | Attending: Internal Medicine | Admitting: Internal Medicine

## 2019-07-24 ENCOUNTER — Encounter
Admission: EM | Disposition: A | Discharge status: Home / Self Care | Payer: Self-pay | Source: Home / Self Care | Attending: Internal Medicine

## 2019-07-24 ENCOUNTER — Encounter: Payer: Self-pay | Admitting: Internal Medicine

## 2019-07-24 DIAGNOSIS — R7989 Other specified abnormal findings of blood chemistry: Secondary | ICD-10-CM

## 2019-07-24 DIAGNOSIS — I2510 Atherosclerotic heart disease of native coronary artery without angina pectoris: Secondary | ICD-10-CM

## 2019-07-24 DIAGNOSIS — I214 Non-ST elevation (NSTEMI) myocardial infarction: Secondary | ICD-10-CM

## 2019-07-24 HISTORY — PX: LEFT HEART CATH AND CORONARY ANGIOGRAPHY: CATH118249

## 2019-07-24 HISTORY — PX: CORONARY STENT INTERVENTION: CATH118234

## 2019-07-24 LAB — COMPREHENSIVE METABOLIC PANEL
ALT: 22 U/L (ref 0–44)
AST: 28 U/L (ref 15–41)
Albumin: 3.6 g/dL (ref 3.5–5.0)
Alkaline Phosphatase: 51 U/L (ref 38–126)
Anion gap: 9 (ref 5–15)
BUN: 9 mg/dL (ref 6–20)
CO2: 24 mmol/L (ref 22–32)
Calcium: 8.9 mg/dL (ref 8.9–10.3)
Chloride: 104 mmol/L (ref 98–111)
Creatinine, Ser: 0.65 mg/dL (ref 0.44–1.00)
GFR calc Af Amer: >60 mL/min (ref >60)
GFR calc non Af Amer: >60 mL/min (ref >60)
Glucose, Bld: 138 mg/dL — ABNORMAL HIGH (ref 70–99)
Potassium: 3.9 mmol/L (ref 3.5–5.1)
Sodium: 137 mmol/L (ref 135–145)
Total Bilirubin: 1.4 mg/dL — ABNORMAL HIGH (ref 0.3–1.2)
Total Protein: 6.9 g/dL (ref 6.5–8.1)

## 2019-07-24 LAB — HEPARIN LEVEL (UNFRACTIONATED): Heparin Unfractionated: 0.25 IU/mL — ABNORMAL LOW (ref 0.30–0.70)

## 2019-07-24 LAB — ECHOCARDIOGRAM COMPLETE
Height: 64 in
Weight: 5019.2 oz

## 2019-07-24 LAB — CBC
HCT: 38 % (ref 36.0–46.0)
Hemoglobin: 13.2 g/dL (ref 12.0–15.0)
MCH: 31.5 pg (ref 26.0–34.0)
MCHC: 34.7 g/dL (ref 30.0–36.0)
MCV: 90.7 fL (ref 80.0–100.0)
Platelets: 233 10*3/uL (ref 150–400)
RBC: 4.19 MIL/uL (ref 3.87–5.11)
RDW: 13.4 % (ref 11.5–15.5)
WBC: 8.1 10*3/uL (ref 4.0–10.5)
nRBC: 0 % (ref 0.0–0.2)

## 2019-07-24 LAB — POCT ACTIVATED CLOTTING TIME
Activated Clotting Time: 257 seconds
Activated Clotting Time: 241 seconds
Activated Clotting Time: 263 seconds

## 2019-07-24 LAB — GLUCOSE, CAPILLARY
Glucose-Capillary: 136 mg/dL — ABNORMAL HIGH (ref 70–99)
Glucose-Capillary: 126 mg/dL — ABNORMAL HIGH (ref 70–99)
Glucose-Capillary: 165 mg/dL — ABNORMAL HIGH (ref 70–99)
Glucose-Capillary: 102 mg/dL — ABNORMAL HIGH (ref 70–99)

## 2019-07-24 SURGERY — LEFT HEART CATH AND CORONARY ANGIOGRAPHY
Anesthesia: Moderate Sedation

## 2019-07-24 MED ORDER — NITROGLYCERIN 1 MG/10 ML FOR IR/CATH LAB
INTRA_ARTERIAL | Status: AC
  Filled 2019-07-24: qty 10

## 2019-07-24 MED ORDER — ATORVASTATIN CALCIUM 80 MG PO TABS
80.0000 mg | ORAL_TABLET | Freq: Every day | ORAL | Status: DC
  Administered 2019-07-24: 80 mg via ORAL
  Filled 2019-07-24: qty 1

## 2019-07-24 MED ORDER — HEPARIN SODIUM (PORCINE) 1000 UNIT/ML IJ SOLN
INTRAMUSCULAR | Status: AC
  Filled 2019-07-24: qty 1

## 2019-07-24 MED ORDER — VERAPAMIL HCL 2.5 MG/ML IV SOLN
INTRAVENOUS | Status: AC
  Filled 2019-07-24: qty 2

## 2019-07-24 MED ORDER — HEPARIN SODIUM (PORCINE) 1000 UNIT/ML IJ SOLN
INTRAMUSCULAR | Status: DC | PRN
  Administered 2019-07-24: 6000 [IU] via INTRAVENOUS

## 2019-07-24 MED ORDER — HYDRALAZINE HCL 20 MG/ML IJ SOLN: 10.0000 mg | INTRAMUSCULAR | Status: AC | PRN

## 2019-07-24 MED ORDER — PERFLUTREN LIPID MICROSPHERE
1.0000 mL | INTRAVENOUS | Status: AC | PRN
  Administered 2019-07-24: 2 mL via INTRAVENOUS
  Filled 2019-07-24: qty 10

## 2019-07-24 MED ORDER — HEPARIN BOLUS VIA INFUSION
1100.0000 [IU] | Freq: Once | INTRAVENOUS | Status: AC
  Administered 2019-07-24: 1100 [IU] via INTRAVENOUS
  Filled 2019-07-24: qty 1100

## 2019-07-24 MED ORDER — SODIUM CHLORIDE 0.9% FLUSH: 3.0000 mL | INTRAVENOUS | Status: DC | PRN

## 2019-07-24 MED ORDER — ENOXAPARIN SODIUM 40 MG/0.4ML ~~LOC~~ SOLN
40.0000 mg | SUBCUTANEOUS | Status: DC
  Administered 2019-07-25: 40 mg via SUBCUTANEOUS
  Filled 2019-07-24: qty 0.4

## 2019-07-24 MED ORDER — MIDAZOLAM HCL 2 MG/2ML IJ SOLN
INTRAMUSCULAR | Status: DC | PRN
  Administered 2019-07-24: 1 mg via INTRAVENOUS

## 2019-07-24 MED ORDER — ASPIRIN 81 MG PO CHEW
81.0000 mg | CHEWABLE_TABLET | Freq: Every day | ORAL | Status: DC
  Administered 2019-07-25: 81 mg via ORAL
  Filled 2019-07-24: qty 1

## 2019-07-24 MED ORDER — HEPARIN SODIUM (PORCINE) 1000 UNIT/ML IJ SOLN
INTRAMUSCULAR | Status: DC | PRN
  Administered 2019-07-24: 2000 [IU] via INTRAVENOUS
  Administered 2019-07-24: 8000 [IU] via INTRAVENOUS
  Administered 2019-07-24 (×2): 3000 [IU] via INTRAVENOUS

## 2019-07-24 MED ORDER — HEPARIN (PORCINE) IN NACL 1000-0.9 UT/500ML-% IV SOLN
INTRAVENOUS | Status: DC | PRN
  Administered 2019-07-24 (×2): 500 mL

## 2019-07-24 MED ORDER — MIDAZOLAM HCL 2 MG/2ML IJ SOLN
INTRAMUSCULAR | Status: AC
  Filled 2019-07-24: qty 2

## 2019-07-24 MED ORDER — FUROSEMIDE 10 MG/ML IJ SOLN
40.0000 mg | Freq: Once | INTRAMUSCULAR | Status: AC
  Administered 2019-07-24: 18:00:00 40 mg via INTRAVENOUS

## 2019-07-24 MED ORDER — FUROSEMIDE 10 MG/ML IJ SOLN
INTRAMUSCULAR | Status: AC
  Administered 2019-07-24: 18:00:00 40 mg via INTRAVENOUS
  Filled 2019-07-24: qty 4

## 2019-07-24 MED ORDER — FENTANYL CITRATE (PF) 100 MCG/2ML IJ SOLN
INTRAMUSCULAR | Status: DC | PRN
  Administered 2019-07-24 (×2): 25 ug via INTRAVENOUS

## 2019-07-24 MED ORDER — FENTANYL CITRATE (PF) 100 MCG/2ML IJ SOLN
INTRAMUSCULAR | Status: DC | PRN
  Administered 2019-07-24: 50 ug via INTRAVENOUS

## 2019-07-24 MED ORDER — TICAGRELOR 90 MG PO TABS
ORAL_TABLET | ORAL | Status: DC | PRN
  Administered 2019-07-24: 180 mg via ORAL

## 2019-07-24 MED ORDER — SODIUM CHLORIDE 0.9% FLUSH
3.0000 mL | Freq: Two times a day (BID) | INTRAVENOUS | Status: DC
  Administered 2019-07-24 – 2019-07-25 (×2): 3 mL via INTRAVENOUS

## 2019-07-24 MED ORDER — SODIUM CHLORIDE 0.9 % WEIGHT BASED INFUSION: 1.0000 mL/kg/h | INTRAVENOUS | Status: DC

## 2019-07-24 MED ORDER — SODIUM CHLORIDE 0.9 % IV SOLN: 250.0000 mL | INTRAVENOUS | Status: DC | PRN

## 2019-07-24 MED ORDER — SODIUM CHLORIDE 0.9 % WEIGHT BASED INFUSION
3.0000 mL/kg/h | INTRAVENOUS | Status: DC
  Administered 2019-07-24: 12:00:00 3 mL/kg/h via INTRAVENOUS

## 2019-07-24 MED ORDER — LABETALOL HCL 5 MG/ML IV SOLN: 10.0000 mg | INTRAVENOUS | Status: AC | PRN

## 2019-07-24 MED ORDER — FENTANYL CITRATE (PF) 100 MCG/2ML IJ SOLN
INTRAMUSCULAR | Status: AC
  Filled 2019-07-24: qty 2

## 2019-07-24 MED ORDER — IOHEXOL 300 MG/ML  SOLN
INTRAMUSCULAR | Status: DC | PRN
  Administered 2019-07-24: 150 mL via INTRA_ARTERIAL

## 2019-07-24 MED ORDER — NITROGLYCERIN 1 MG/10 ML FOR IR/CATH LAB
INTRA_ARTERIAL | Status: DC | PRN
  Administered 2019-07-24 (×2): 200 ug via INTRACORONARY

## 2019-07-24 MED ORDER — VERAPAMIL HCL 2.5 MG/ML IV SOLN
INTRAVENOUS | Status: DC | PRN
  Administered 2019-07-24: 2.5 mg via INTRAVENOUS
  Administered 2019-07-24: 2.5 mg via INTRA_ARTERIAL

## 2019-07-24 MED ORDER — TICAGRELOR 90 MG PO TABS
ORAL_TABLET | ORAL | Status: AC
  Filled 2019-07-24: qty 2

## 2019-07-24 MED ORDER — TICAGRELOR 90 MG PO TABS
90.0000 mg | ORAL_TABLET | Freq: Two times a day (BID) | ORAL | Status: DC
  Administered 2019-07-24 – 2019-07-25 (×2): 90 mg via ORAL
  Filled 2019-07-24 (×2): qty 1

## 2019-07-24 MED ORDER — IOHEXOL 300 MG/ML  SOLN
INTRAMUSCULAR | Status: DC | PRN
  Administered 2019-07-24: 80 mL

## 2019-07-24 MED ORDER — ANGIOPLASTY BOOK
Freq: Once | Status: AC
  Administered 2019-07-24: 22:00:00
  Filled 2019-07-24: qty 1

## 2019-07-24 MED ORDER — HEPARIN (PORCINE) IN NACL 1000-0.9 UT/500ML-% IV SOLN
INTRAVENOUS | Status: AC
  Filled 2019-07-24: qty 1000

## 2019-07-24 SURGICAL SUPPLY — 22 items
BALLN TREK RX 2.25X12 (BALLOONS) ×3
BALLN TREK RX 2.5X12 (BALLOONS) ×3
BALLN ~~LOC~~ EUPHORA RX 3.0X20 (BALLOONS) ×3
BALLN ~~LOC~~ EUPHORA RX 3.25X15 (BALLOONS) ×3
BALLOON TREK RX 2.25X12 (BALLOONS) ×1 IMPLANT
BALLOON TREK RX 2.5X12 (BALLOONS) ×1 IMPLANT
BALLOON ~~LOC~~ EUPHORA RX 3.0X20 (BALLOONS) ×1 IMPLANT
BALLOON ~~LOC~~ EUPHORA RX 3.25X15 (BALLOONS) ×1 IMPLANT
CATH INFINITI 5 FR JL3.5 (CATHETERS) ×3 IMPLANT
CATH INFINITI 5FR ANG PIGTAIL (CATHETERS) ×3 IMPLANT
CATH INFINITI JR4 5F (CATHETERS) ×3 IMPLANT
CATH LAUNCHER 6FR EBU3.5 (CATHETERS) ×3 IMPLANT
CATH VISTA GUIDE 6FR JR4 (CATHETERS) ×3 IMPLANT
DEVICE RAD COMP TR BAND LRG (VASCULAR PRODUCTS) ×3 IMPLANT
GLIDESHEATH SLEND SS 6F .021 (SHEATH) ×3 IMPLANT
KIT MANI 3VAL PERCEP (MISCELLANEOUS) ×3 IMPLANT
PACK CARDIAC CATH (CUSTOM PROCEDURE TRAY) ×3 IMPLANT
STENT RESOLUTE ONYX 2.75X38 (Permanent Stent) ×3 IMPLANT
STENT RESOLUTE ONYX 3.0X12 (Permanent Stent) ×3 IMPLANT
STENT RESOLUTE ONYX 3.0X26 (Permanent Stent) ×3 IMPLANT
WIRE ROSEN-J .035X260CM (WIRE) ×3 IMPLANT
WIRE RUNTHROUGH .014X180CM (WIRE) ×3 IMPLANT

## 2019-07-24 NOTE — Progress Notes (Signed)
ANTICOAGULATION CONSULT NOTE  Pharmacy Consult for Heparin Indication: chest pain/ACS  No Known Allergies  Patient Measurements: Height: 5\' 4"  (162.6 cm) Weight: (!) 313 lb 11.2 oz (142.3 kg) IBW/kg (Calculated) : 54.7 HEPARIN DW (KG): 90.5  Vital Signs: Temp: 98.2 F (36.8 C) (12/01 0817) Temp Source: Oral (12/01 0346) BP: 92/41 (12/01 0817) Pulse Rate: 59 (12/01 0817)  Labs: Recent Labs    07/23/19 0432 07/23/19 0532 07/23/19 0628 07/23/19 1251 07/23/19 2159 07/24/19 0548  HGB 14.1  --   --   --   --  13.2  HCT 40.8  --   --   --   --  38.0  PLT 241  --   --   --   --  233  APTT  --  33  --   --   --   --   LABPROT  --  12.7  --   --   --   --   INR  --  1.0  --   --   --   --   HEPARINUNFRC  --   --   --  0.12* 0.21* 0.25*  CREATININE 0.78  --   --   --   --  0.65  TROPONINIHS 663*  --  650* 2,278*  --   --    Estimated Creatinine Clearance: 113.8 mL/min (by C-G formula based on SCr of 0.65 mg/dL).  Medical History: Past Medical History:  Diagnosis Date  . Hypothyroidism   . Thyroid disease    Medications:  Medications Prior to Admission  Medication Sig Dispense Refill Last Dose  . ARMOUR THYROID 180 MG tablet Take 180 mg by mouth daily.   07/22/2019 at Unknown time  . azithromycin (ZITHROMAX) 250 MG tablet Take 250-500 mg by mouth as directed.   07/22/2019 at Unknown time    Assessment: Pharmacy asked to initiate and manage Heparin for ACS.  Goal of Therapy:  Heparin level 0.3-0.7 units/ml Monitor platelets by anticoagulation protocol: Yes   Plan:  12/01 0548 HL 0.25, subtherapeutic. Heparin 1100 unit bolus followed by increase in heparin drip to 1550 units/hr. HL ordered at 1600. Plan for cath today, will follow up heparin plan as indicated.  Tawnya Crook, PharmD 07/24/2019,9:34 AM

## 2019-07-24 NOTE — Progress Notes (Signed)
Progress Note    Tonya [name]  RUE:[id number]:[id number] DOB: 1965-10-20  DOA: 07/23/2019 PCP: [facility]      Brief Narrative:    Medical records reviewed and are as summarized below:  Tonya [name] is an 53 y.o. female with medical history significant for hypothyroidism presented to the hospital with nausea, shortness of breath and chest pain that radiated to both arms.  She was found to have acute non-ST elevation MI.      Assessment/Plan:   Active Problems:   NSTEMI (non-ST elevated myocardial infarction) (HCC)   Hypothyroidism (acquired)   Body mass index is 53.85 kg/m.   Acute non-ST elevation MI: Continue aspirin, Lipitor and IV heparin infusion.  Monitor PTT per protocol.  Patient has been evaluated by cardiologist with plans for cardiac catheterization today.  Type 2 diabetes mellitus: Hemoglobin A1c is 7.7.  Lifestyle changes advised.  Morbid obesity: Weight loss recommended.   Family Communication/Anticipated D/C date and plan/Code Status   DVT prophylaxis: IV heparin drip Code Status: Full code Family Communication: Plan discussed with the patient Disposition Plan: Possible discharge to home in 1 to 2 days      Subjective:   No shortness of breath, chest pain or dizziness.  She feels better today.  Objective:    Vitals:   07/23/19 1458 07/23/19 1953 07/24/19 0346 07/24/19 0817  BP: 123/73 (!) 139/54 (!) 125/53 (!) 92/41  Pulse: 67 68 78 (!) 59  Resp: 18   19  Temp: 98.5 F (36.9 C) 98.8 F (37.1 C) 98.5 F (36.9 C) 98.2 F (36.8 C)  TempSrc: Axillary Oral Oral   SpO2: 96% 97% 95% 95%  Weight:   (!) 142.3 kg   Height:        Intake/Output Summary (Last 24 hours) at 07/24/2019 1023 Last data filed at 07/24/2019 [id number] Gross per 24 hour  Intake 2272.73 ml  Output 300 ml  Net 1972.73 ml   Filed Weights   07/23/19 0425 07/24/19 0346  Weight: (!) 142 kg (!) 142.3 kg    Exam:  GEN: NAD SKIN: No rash EYES: EOMI ENT: MMM  CV: RRR PULM: CTA B ABD: soft, obese, NT, +BS CNS: AAO x 3, non focal EXT: No edema or tenderness   Data Reviewed:   I have personally reviewed following labs and imaging studies:  Labs: Labs show the following:   Basic Metabolic Panel: Recent Labs  Lab 07/23/19 0432 07/24/19 0548  NA 139 137  K 3.8 3.9  CL 103 104  CO2 27 24  GLUCOSE 182* 138*  BUN 10 9  CREATININE 0.78 0.65  CALCIUM 9.7 8.9   GFR Estimated Creatinine Clearance: 113.8 mL/min (by C-G formula based on SCr of 0.65 mg/dL). Liver Function Tests: Recent Labs  Lab 07/24/19 0548  AST 28  ALT 22  ALKPHOS 51  BILITOT 1.4*  PROT 6.9  ALBUMIN 3.6   No results for input(s): LIPASE, AMYLASE in the last 168 hours. No results for input(s): AMMONIA in the last 168 hours. Coagulation profile Recent Labs  Lab 07/23/19 0532  INR 1.0    CBC: Recent Labs  Lab 07/23/19 0432 07/24/19 0548  WBC 8.6 8.1  HGB 14.1 13.2  HCT 40.8 38.0  MCV 89.9 90.7  PLT 241 233   Cardiac Enzymes: No results for input(s): CKTOTAL, CKMB, CKMBINDEX, TROPONINI in the last 168 hours. BNP (last 3 results) No results for input(s): PROBNP in the last 8760 hours. CBG: Recent Labs  Lab  07/23/19 1708 07/23/19 2117 07/24/19 0818  GLUCAP 116* 127* 136*   D-Dimer: No results for input(s): DDIMER in the last 72 hours. Hgb A1c: Recent Labs    07/23/19 0857  HGBA1C 7.7*   Lipid Profile: Recent Labs    07/23/19 0857  CHOL 197  HDL 53  LDLCALC 122*  TRIG 112  CHOLHDL 3.7   Thyroid function studies: Recent Labs    07/23/19 0857  TSH 0.043*   Anemia work up: No results for input(s): VITAMINB12, FOLATE, FERRITIN, TIBC, IRON, RETICCTPCT in the last 72 hours. Sepsis Labs: Recent Labs  Lab 07/23/19 0432 07/24/19 0548  WBC 8.6 8.1    Microbiology Recent Results (from the past 240 hour(s))  SARS Coronavirus 2 by RT PCR (hospital order, performed in [facility] hospital lab) Nasopharyngeal Nasopharyngeal Swab      Status: None   Collection Time: 07/23/19  5:35 AM   Specimen: Nasopharyngeal Swab  Result Value Ref Range Status   SARS Coronavirus 2 NEGATIVE NEGATIVE Final    Comment: (NOTE) SARS-CoV-2 target nucleic acids are NOT DETECTED. The SARS-CoV-2 RNA is generally detectable in upper and lower respiratory specimens during the acute phase of infection. The lowest concentration of SARS-CoV-2 viral copies this assay can detect is 250 copies / mL. A negative result does not preclude SARS-CoV-2 infection and should not be used as the sole basis for treatment or other patient management decisions.  A negative result may occur with improper specimen collection / handling, submission of specimen other than nasopharyngeal swab, presence of viral mutation(s) within the areas targeted by this assay, and inadequate number of viral copies (<250 copies / mL). A negative result must be combined with clinical observations, patient history, and epidemiological information. Fact Sheet for Patients:   StrictlyIdeas.no Fact Sheet for Healthcare Providers: BankingDealers.co.za This test is not yet approved or cleared  by the [place] FDA and has been authorized for detection and/or diagnosis of SARS-CoV-2 by FDA under an Emergency Use Authorization (EUA).  This EUA will remain in effect (meaning this test can be used) for the duration of the COVID-19 declaration under Section 564(b)(1) of the Act, 21 U.S.C. section 360bbb-3(b)(1), unless the authorization is terminated or revoked sooner. Performed at [facility], [address] [place], [place] [address]     Procedures and diagnostic studies:  Dg Chest 2 View  Result Date: 07/23/2019 CLINICAL DATA:  Chest pain. EXAM: CHEST - 2 VIEW COMPARISON:  None. FINDINGS: The heart size is exaggerated by low lung volumes. Lingular airspace disease is present. Minimal patchy opacities are present in  the right middle or lower lobe as well. The upper lung fields are clear. Degenerative changes are noted at the shoulders bilaterally. IMPRESSION: 1. Lingular airspace disease compatible with pneumonia. 2. Minimal patchy opacities in the right middle or lower lobe likely represent atelectasis. Infection is not excluded. 3. Low lung volumes. Electronically Signed   By: San Morelle M.D.   On: 07/23/2019 05:43    Medications:   . insulin aspart  0-5 Units Subcutaneous QHS  . insulin aspart  0-9 Units Subcutaneous TID WC  . insulin aspart  2 Units Subcutaneous TID WC  . sodium chloride flush  3 mL Intravenous Q12H   Continuous Infusions: . sodium chloride    . heparin 1,550 Units/hr (07/24/19 0947)     LOS: 1 day   Lavonte Palos  [facility]   *Please refer to [email or web address], password [email or web address] to get updated schedule on who will  round on this patient, as hospitalists switch teams weekly. If 7PM-7AM, please contact night-coverage at [email or web address], password [email or web address] for any overnight needs.  07/24/2019, 10:23 AM

## 2019-07-24 NOTE — Brief Op Note (Signed)
07/24/2019  3:40 PM  PATIENT:  Tonya [name]  53 y.o. female  PRE-OPERATIVE DIAGNOSIS:  nstemi  POST-OPERATIVE DIAGNOSIS:    PROCEDURE:  Procedure(s) with comments: LEFT HEART CATH AND CORONARY ANGIOGRAPHY (N/A) CORONARY STENT INTERVENTION (N/A) - RCA & CFX  SURGEON:  Surgeon(s) and Role: Panel 1:    * Corey Skains, MD - Primary Panel 2:    Saunders Revel, Harrell Gave, MD - Primary  FINDINGS: 1. Severe 2 vessel CAD.  See Dr. Alveria Apley diagnostic catheterization report for details. 2. Successful PCI to mid RCA with overlapping Resolute Onyx 3.0 x 12 mm and 2.75 x 38 mm drug eluting stents. 3. Successful PCI to mid/distal LCx with Resolute Onyx 3.0 x 26 mm drug eluting stents.  RECOMMENDATIONS: 1. DAPT with ASA and ticagrelor for at least 12 months. 2. Gentle diuresis, given significantly elevated LVEDP. 3. Aggressive secondary prevention.  Nelva Bush, MD [facility]

## 2019-07-24 NOTE — Progress Notes (Signed)
*  PRELIMINARY RESULTS* Echocardiogram 2D Echocardiogram has been performed.  Tonya [name] Tonya [name] Tonya [name] Tonya [name] 07/24/2019, 10:50 AM

## 2019-07-24 NOTE — Progress Notes (Signed)
[facility] Cardiology [facility] Encounter Note  Patient: Tonya [name] / Admit Date: 07/23/2019 / Date of Encounter: 07/24/2019, 2:11 PM   Subjective: Patient is free of anginal symptoms.  Troponin peak at 2200 consistent with non-ST elevation myocardial infarction.  Patient tolerating current medications well Cardiac catheterization shows hypokinesis of the inferior lateral wall with ejection fraction of 50% Total occlusion of the proximal right coronary artery with collaterals to the PDA from the left Significant 90% stenosis of proximal circumflex artery Moderate atherosclerosis of the left anterior descending artery  Review of Systems: Positive for: Shortness of breath Negative for: Vision change, hearing change, syncope, dizziness, nausea, vomiting,diarrhea, bloody stool, stomach pain, cough, congestion, diaphoresis, urinary frequency, urinary pain,skin lesions, skin rashes Others previously listed  Objective: Telemetry: Normal sinus rhythm Physical Exam: Blood pressure 135/87, pulse 60, temperature 98.1 F (36.7 C), temperature source Oral, resp. rate 18, height 5\' 4"  (1.626 m), weight (!) 142.3 kg, last menstrual period 07/22/2016, SpO2 96 %. Body mass index is 53.85 kg/m. General: Well developed, well nourished, in no acute distress. Head: Normocephalic, atraumatic, sclera non-icteric, no xanthomas, nares are without discharge. Neck: No apparent masses Lungs: Normal respirations with no wheezes, no rhonchi, no rales , no crackles   Heart: Regular rate and rhythm, normal S1 S2, no murmur, no rub, no gallop, PMI is normal size and placement, carotid upstroke normal without bruit, jugular venous pressure normal Abdomen: Soft, non-tender, non-distended with normoactive bowel sounds. No hepatosplenomegaly. Abdominal aorta is normal size without bruit Extremities: No edema, no clubbing, no cyanosis, no ulcers,  Peripheral: 2+ radial, 2+ femoral, 2+ dorsal pedal pulses Neuro: Alert  and oriented. Moves all extremities spontaneously. Psych:  Responds to questions appropriately with a normal affect.   Intake/Output Summary (Last 24 hours) at 07/24/2019 1411 Last data filed at 07/24/2019 14/08/2018 Gross per 24 hour  Intake 2032.73 ml  Output 300 ml  Net 1732.73 ml    Inpatient Medications:  . [MAR Hold] insulin aspart  0-5 Units Subcutaneous QHS  . [MAR Hold] insulin aspart  0-9 Units Subcutaneous TID WC  . [MAR Hold] insulin aspart  2 Units Subcutaneous TID WC  . [MAR Hold] sodium chloride flush  3 mL Intravenous Q12H   Infusions:  . sodium chloride    . [START ON 07/25/2019] sodium chloride 3 mL/kg/hr (07/24/19 1200)   Followed by  . [START ON 07/25/2019] sodium chloride    . heparin Stopped (07/24/19 1157)    Labs: Recent Labs    07/23/19 0432 07/24/19 0548  NA 139 137  K 3.8 3.9  CL 103 104  CO2 27 24  GLUCOSE 182* 138*  BUN 10 9  CREATININE 0.78 0.65  CALCIUM 9.7 8.9   Recent Labs    07/24/19 0548  AST 28  ALT 22  ALKPHOS 51  BILITOT 1.4*  PROT 6.9  ALBUMIN 3.6   Recent Labs    07/23/19 0432 07/24/19 0548  WBC 8.6 8.1  HGB 14.1 13.2  HCT 40.8 38.0  MCV 89.9 90.7  PLT 241 233   No results for input(s): CKTOTAL, CKMB, TROPONINI in the last 72 hours. Invalid input(s): POCBNP Recent Labs    07/23/19 0857  HGBA1C 7.7*     Weights: Filed Weights   07/23/19 0425 07/24/19 0346  Weight: (!) 142 kg (!) 142.3 kg     Radiology/Studies:  Dg Chest 2 View  Result Date: 07/23/2019 CLINICAL DATA:  Chest pain. EXAM: CHEST - 2 VIEW COMPARISON:  None. FINDINGS: The  heart size is exaggerated by low lung volumes. Lingular airspace disease is present. Minimal patchy opacities are present in the right middle or lower lobe as well. The upper lung fields are clear. Degenerative changes are noted at the shoulders bilaterally. IMPRESSION: 1. Lingular airspace disease compatible with pneumonia. 2. Minimal patchy opacities in the right middle or lower  lobe likely represent atelectasis. Infection is not excluded. 3. Low lung volumes. Electronically Signed   By: San Morelle M.D.   On: 07/23/2019 05:43     Assessment and Recommendation  53 y.o. female with known hypertension hyperlipidemia diabetes with acute non-ST elevation myocardial infarction with complete occlusion of right coronary artery with collaterals and significant stenoses of circumflex artery.  Unclear whether the right coronary artery is new onset 1.  Attempt PCI and stent placement of right coronary artery although if concerns for chronic total occlusion will medically manage 2.  PCI and stent placement of 90% circumflex artery stenosis 3.  High intensity cholesterol therapy 4.  Dual antiplatelet therapy 5.  Continue diabetes control with goal hemoglobin A1c below 7 6.  Begin cardiac rehabilitation 7.  Further treatment of medication management of myocardial infarction with beta-blocker and ACE inhibitor as able  Signed, Serafina Royals M.D. FACC

## 2019-07-25 ENCOUNTER — Encounter: Payer: Self-pay | Admitting: Internal Medicine

## 2019-07-25 DIAGNOSIS — Z955 Presence of coronary angioplasty implant and graft: Secondary | ICD-10-CM

## 2019-07-25 DIAGNOSIS — I214 Non-ST elevation (NSTEMI) myocardial infarction: Secondary | ICD-10-CM

## 2019-07-25 LAB — GLUCOSE, CAPILLARY
Glucose-Capillary: 121 mg/dL — ABNORMAL HIGH (ref 70–99)
Glucose-Capillary: 120 mg/dL — ABNORMAL HIGH (ref 70–99)

## 2019-07-25 LAB — CBC
HCT: 38.6 % (ref 36.0–46.0)
Hemoglobin: 13.4 g/dL (ref 12.0–15.0)
MCH: 31.1 pg (ref 26.0–34.0)
MCHC: 34.7 g/dL (ref 30.0–36.0)
MCV: 89.6 fL (ref 80.0–100.0)
Platelets: 241 10*3/uL (ref 150–400)
RBC: 4.31 MIL/uL (ref 3.87–5.11)
RDW: 13.2 % (ref 11.5–15.5)
WBC: 8.2 10*3/uL (ref 4.0–10.5)
nRBC: 0 % (ref 0.0–0.2)

## 2019-07-25 LAB — BASIC METABOLIC PANEL
Anion gap: 9 (ref 5–15)
BUN: 9 mg/dL (ref 6–20)
CO2: 24 mmol/L (ref 22–32)
Calcium: 9.1 mg/dL (ref 8.9–10.3)
Chloride: 104 mmol/L (ref 98–111)
Creatinine, Ser: 0.67 mg/dL (ref 0.44–1.00)
GFR calc Af Amer: >60 mL/min (ref >60)
GFR calc non Af Amer: >60 mL/min (ref >60)
Glucose, Bld: 131 mg/dL — ABNORMAL HIGH (ref 70–99)
Potassium: 3.6 mmol/L (ref 3.5–5.1)
Sodium: 137 mmol/L (ref 135–145)

## 2019-07-25 MED ORDER — ATORVASTATIN CALCIUM 80 MG PO TABS: 80.0000 mg | ORAL_TABLET | Freq: Every day | ORAL | 0 refills | Status: DC

## 2019-07-25 MED ORDER — ASPIRIN EC 81 MG PO TBEC: 81.0000 mg | DELAYED_RELEASE_TABLET | Freq: Every day | ORAL | 0 refills | Status: AC

## 2019-07-25 MED ORDER — LISINOPRIL 5 MG PO TABS
5.0000 mg | ORAL_TABLET | Freq: Every day | ORAL | Status: DC
  Administered 2019-07-25: 5 mg via ORAL
  Filled 2019-07-25: qty 1

## 2019-07-25 MED ORDER — TICAGRELOR 90 MG PO TABS: 90.0000 mg | ORAL_TABLET | Freq: Two times a day (BID) | ORAL | 0 refills | Status: DC

## 2019-07-25 MED ORDER — LISINOPRIL 5 MG PO TABS: 5.0000 mg | ORAL_TABLET | Freq: Every day | ORAL | 0 refills | Status: DC

## 2019-07-25 NOTE — Plan of Care (Signed)
Discharge instructions provided to pt.  All questions addressed.  Understanding verified through teach back.  Awaiting transportation home via POV.   Problem: Education: Goal: Knowledge of General Education information will improve Description: Including pain rating scale, medication(s)/side effects and non-pharmacologic comfort measures Outcome: Adequate for Discharge   Problem: Health Behavior/Discharge Planning: Goal: Ability to manage health-related needs will improve Outcome: Adequate for Discharge   Problem: Clinical Measurements: Goal: Ability to maintain clinical measurements within normal limits will improve Outcome: Adequate for Discharge Goal: Will remain free from infection Outcome: Adequate for Discharge Goal: Diagnostic test results will improve Outcome: Adequate for Discharge Goal: Respiratory complications will improve Outcome: Adequate for Discharge Goal: Cardiovascular complication will be avoided Outcome: Adequate for Discharge   Problem: Activity: Goal: Risk for activity intolerance will decrease Outcome: Adequate for Discharge   Problem: Nutrition: Goal: Adequate nutrition will be maintained Outcome: Adequate for Discharge   Problem: Coping: Goal: Level of anxiety will decrease Outcome: Adequate for Discharge   Problem: Elimination: Goal: Will not experience complications related to bowel motility Outcome: Adequate for Discharge Goal: Will not experience complications related to urinary retention Outcome: Adequate for Discharge   Problem: Pain Managment: Goal: General experience of comfort will improve Outcome: Adequate for Discharge   Problem: Safety: Goal: Ability to remain free from injury will improve Outcome: Adequate for Discharge   Problem: Skin Integrity: Goal: Risk for impaired skin integrity will decrease Outcome: Adequate for Discharge   Problem: Education: Goal: Understanding of CV disease, CV risk reduction, and recovery process  will improve Outcome: Adequate for Discharge Goal: Individualized Educational Video(s) Outcome: Adequate for Discharge   Problem: Activity: Goal: Ability to return to baseline activity level will improve Outcome: Adequate for Discharge   Problem: Cardiovascular: Goal: Ability to achieve and maintain adequate cardiovascular perfusion will improve Outcome: Adequate for Discharge Goal: Vascular access site(s) Level 0-1 will be maintained Outcome: Adequate for Discharge   Problem: Health Behavior/Discharge Planning: Goal: Ability to safely manage health-related needs after discharge will improve Outcome: Adequate for Discharge   

## 2019-07-25 NOTE — Progress Notes (Signed)
Cardiovascular and Pulmonary Nurse Navigator Note:   53 y.o. female with known hypertension, hyperlipidemia, hypothyroidism, diabetes admitted with  acute non-ST elevation myocardial infarction.    Echo performed on 07/24/2019:   1. Left ventricular ejection fraction, by visual estimation, is 45 to 50%. The left ventricle has mildly decreased function. There is no left ventricular hypertrophy. 2. Definity contrast agent was given IV to delineate the left ventricular endocardial borders. 3. Small inferior myocardial infarction. 4. The left ventricle demonstrates regional wall motion abnormalities. 5. Global right ventricle has normal systolic function.The right ventricular size is normal. No increase in right ventricular wall thickness. 6. Left atrial size was normal. 7. Right atrial size was normal. 8. The mitral valve is normal in structure. Mild mitral valve regurgitation. 9. The tricuspid valve is normal in structure. Tricuspid valve regurgitation is trivial. 10. The aortic valve is normal in structure. Aortic valve regurgitation is not visualized. 11. The pulmonic valve was grossly normal. Pulmonic valve regurgitation is not visualized.  07/24/2019:   LEFT HEART CATH AND CORONARY ANGIOGRAPHY  Conclusion    Prox RCA lesion is 100% stenosed.  Prox Cx lesion is 90% stenosed.  Mid Cx lesion is 35% stenosed.  Mid LAD lesion is 40% stenosed.   53 year old female with hypertension hyperlipidemia diabetes and obesity with acute non-ST elevation myocardial infarction  LV with inferior lateral hypokinesis with ejection fraction of 50%  Occlusion of the mid right coronary artery with collaterals to the PDA from the left Significant 90% stenosis of proximal left circumflex artery Moderate atherosclerosis of left anterior descending artery  Plan Attempt PCI and stent placement of proximal to mid right coronary artery if culprit and not appearing to be chronic total occlusion PCI  and stent placement proximal left circumflex artery due to significant stenoses and non-ST elevation myocardial infarction Dual antiplatelet therapy Beta-blocker and ACE inhibitor for myocardial infarction and hypertension control Diabetes controlled with current medical regimen and insulin no with a goal hemoglobin A1c below 7 Begin cardiac rehabilitation   Procedures 07/24/2019:   CORONARY STENT INTERVENTION  Conclusion  Conclusions: 1. Severe two-vessel Neri artery disease. See Dr. Philemon Kingdom diagnostic catheterization report for details. 2. Successful PCI to mid RCA with overlapping Resolute Onyx 3.0 x 12 mm and 2.75 x 38 mm drug eluting stents. 3. Successful PCI to mid/distal LCx with Resolute Onyx 3.0 x 26 mm drug eluting stents.  Recommendations: 1. DAPT with ASA and ticagrelor for at least 12 months. 2. Gentle diuresis, given significantly elevated LVEDP. 3. Aggressive secondary prevention.  Yvonne Kendall, MD [facility]   Education:   Rounded on patient to provide education and discuss Cardiac Rehab.  Patient sitting up on side of bed.   "Heart Attack Bouncing Back" booklet given and reviewed with patient. Discussed the definition of CAD. Reviewed the location of CAD and where her stents were placed. Informed patient she will be given stent cards. Explained the purpose of the stent cards. Instructed patient to keep stent cards in her wallet.  ? Discussed modifiable risk factors including controlling blood pressure, cholesterol, and blood sugar; following heart healthy diet; maintaining healthy weight; exercise; and smoking cessation, if applicable.   ? Discussed cardiac medications including rationale for taking, mechanisms of action, and side effects. Stressed the importance of taking medications as prescribed. Brilinta discount card given to patient.    This RN stressed the importance of having a PCP to help direct her care.  Patient is new to area and does not have  a local PCP.  Contacted TOC SW to provide patient with information on PCPs in the area.   ? Discussed emergency plan for heart attack symptoms. Patient verbalized understanding of need to call 911 and not to drive herself to ER if having cardiac symptoms / chest pain.    Patient currently on heart healthy diet. Information on this diet provided.   Patient's HGB A1C 7.7 this admission.  ? Smoking Cessation - Patient is NEVER every day smoker.  ? Exercise - Benefits of exercised discussed.  Approximately, a year ago patient started eating healthier and exercising.  She reports losing 30 pounds.  Since Covid-19 pandemic patient has gained the weight back and stopped exercising.   Informed patient her cardiologist has referred her to outpatient Cardiac Rehab. An overview of the program was provided. Cardiac Rehab brochure and information letter with CPT billing codes given to patient.  Patient plans to check with her insurance company to see if cardiac rehab will be covered.  Patient plans to participate in the program.  Patient agreeable to receiving a phone in one to two weeks to schedule her first appointment.    Patient appreciative of the above information.  ? Roanna Epley, RN, BSN, [place]  [facility]  Cardiovascular & Pulmonary Nurse Navigator  Direct Line: [phone]  Department Phone #: [phone] Fax: [phone]  Email Address: [email or web address]

## 2019-07-25 NOTE — Progress Notes (Signed)
[facility] Cardiology [facility] Encounter Note  Patient: Tonya [name] / Admit Date: 07/23/2019 / Date of Encounter: 07/25/2019, 8:59 AM   Subjective: Patient is free of anginal symptoms.  Troponin peak at 2200 consistent with non-ST elevation myocardial infarction.  Patient tolerating current medications well Cardiac catheterization shows hypokinesis of the inferior lateral wall with ejection fraction of 50% Total occlusion of the proximal right coronary artery with collaterals to the PDA from the left Significant 90% stenosis of proximal circumflex artery Moderate atherosclerosis of the left anterior descending artery Now status post PCI and stent placement of proximal to mid right coronary artery and proximal left circumflex artery without complication Review of Systems: Positive for: None Negative for: Vision change, hearing change, syncope, dizziness, nausea, vomiting,diarrhea, bloody stool, stomach pain, cough, congestion, diaphoresis, urinary frequency, urinary pain,skin lesions, skin rashes Others previously listed  Objective: Telemetry: Normal sinus rhythm Physical Exam: Blood pressure 136/63, pulse (!) 57, temperature 98.3 F (36.8 C), temperature source Oral, resp. rate 19, height 5\' 4"  (1.626 m), weight (!) 141 kg, last menstrual period 07/22/2016, SpO2 96 %. Body mass index is 53.37 kg/m. General: Well developed, well nourished, in no acute distress. Head: Normocephalic, atraumatic, sclera non-icteric, no xanthomas, nares are without discharge. Neck: No apparent masses Lungs: Normal respirations with no wheezes, no rhonchi, no rales , no crackles   Heart: Regular rate and rhythm, normal S1 S2, no murmur, no rub, no gallop, PMI is normal size and placement, carotid upstroke normal without bruit, jugular venous pressure normal Abdomen: Soft, non-tender, non-distended with normoactive bowel sounds. No hepatosplenomegaly. Abdominal aorta is normal size without  bruit Extremities: No edema, no clubbing, no cyanosis, no ulcers,  Peripheral: 2+ radial, 2+ femoral, 2+ dorsal pedal pulses Neuro: Alert and oriented. Moves all extremities spontaneously. Psych:  Responds to questions appropriately with a normal affect.   Intake/Output Summary (Last 24 hours) at 07/25/2019 0859 Last data filed at 07/25/2019 0730 Gross per 24 hour  Intake -  Output 1200 ml  Net -1200 ml    Inpatient Medications:  . aspirin  81 mg Oral Daily  . atorvastatin  80 mg Oral q1800  . enoxaparin (LOVENOX) injection  40 mg Subcutaneous Q24H  . insulin aspart  0-5 Units Subcutaneous QHS  . insulin aspart  0-9 Units Subcutaneous TID WC  . insulin aspart  2 Units Subcutaneous TID WC  . sodium chloride flush  3 mL Intravenous Q12H  . sodium chloride flush  3 mL Intravenous Q12H  . ticagrelor  90 mg Oral BID   Infusions:  . sodium chloride      Labs: Recent Labs    07/24/19 0548 07/25/19 0530  NA 137 137  K 3.9 3.6  CL 104 104  CO2 24 24  GLUCOSE 138* 131*  BUN 9 9  CREATININE 0.65 0.67  CALCIUM 8.9 9.1   Recent Labs    07/24/19 0548  AST 28  ALT 22  ALKPHOS 51  BILITOT 1.4*  PROT 6.9  ALBUMIN 3.6   Recent Labs    07/24/19 0548 07/25/19 0530  WBC 8.1 8.2  HGB 13.2 13.4  HCT 38.0 38.6  MCV 90.7 89.6  PLT 233 241   No results for input(s): CKTOTAL, CKMB, TROPONINI in the last 72 hours. Invalid input(s): POCBNP Recent Labs    07/23/19 0857  HGBA1C 7.7*     Weights: Filed Weights   07/23/19 0425 07/24/19 0346 07/25/19 0453  Weight: (!) 142 kg (!) 142.3 kg (!) 141 kg  Radiology/Studies:  Dg Chest 2 View  Result Date: 07/23/2019 CLINICAL DATA:  Chest pain. EXAM: CHEST - 2 VIEW COMPARISON:  None. FINDINGS: The heart size is exaggerated by low lung volumes. Lingular airspace disease is present. Minimal patchy opacities are present in the right middle or lower lobe as well. The upper lung fields are clear. Degenerative changes are noted at  the shoulders bilaterally. IMPRESSION: 1. Lingular airspace disease compatible with pneumonia. 2. Minimal patchy opacities in the right middle or lower lobe likely represent atelectasis. Infection is not excluded. 3. Low lung volumes. Electronically Signed   By: Marin Roberts M.D.   On: 07/23/2019 05:43     Assessment and Recommendation  53 y.o. female with known hypertension hyperlipidemia diabetes with acute non-ST elevation myocardial infarction with complete occlusion of right coronary artery with collaterals and significant stenoses of circumflex artery.  Unclear whether the right coronary artery is new onset 1.  Successful PCI and stent placement right coronary artery and left circumflex artery without complication 2.  No further cardiac intervention and/or diagnostics necessary at this time 3.  High intensity cholesterol therapy 4.  Dual antiplatelet therapy 5.  Continue diabetes control with goal hemoglobin A1c below 7 6.  Begin cardiac rehabilitation 7.  Further treatment of medication management of myocardial infarction with beta-blocker and ACE inhibitor as able although currently unable to use beta-blocker due to some bradycardia 8.  Okay for discharged home from cardiac standpoint Signed, Arnoldo Hooker M.D. FACC

## 2019-07-25 NOTE — Discharge Instructions (Signed)
Heart Attack °The heart is a muscle that needs oxygen to survive. A heart attack is a condition that occurs when your heart does not get enough oxygen. When this happens, the heart muscle begins to die. This can cause permanent damage if not treated right away. A heart attack is a medical emergency. °This condition may be called a myocardial infarction, or MI. It is also known as acute coronary syndrome (ACS). ACS is a term used to describe a group of conditions that affect blood flow to the heart. °What are the causes? °This condition may be caused by: °· Atherosclerosis. This occurs when a fatty substance called plaque builds up in the arteries and blocks or reduces blood supply to the heart. °· A blood clot. A blood clot can develop suddenly when plaque breaks up within an artery and blocks blood flow to the heart. °· Low blood pressure. °· An abnormal heartbeat (arrhythmia). °· Conditions that cause a decrease of oxygen to the heart, such as anemiaorrespiratory failure. °· A spasm, or severe tightening, of a blood vessel that cuts off blood flow to the heart. °· Tearing of a coronary artery (spontaneous coronary artery dissection). °· High blood pressure. °What increases the risk? °The following factors may make you more likely to develop this condition: °· Aging. The older you are, the higher your risk. °· Having a personal or family history of chest pain, heart attack, stroke, or narrowing of the arteries in the legs, arms, head, or stomach (peripheral artery disease). °· Being female. °· Smoking. °· Not getting regular exercise. °· Being overweight or obese. °· Having high blood pressure. °· Having high cholesterol (hypercholesterolemia). °· Having diabetes. °· Drinking too much alcohol. °· Using illegal drugs, such as cocaine or methamphetamine. °What are the signs or symptoms? °Symptoms of this condition may vary, depending on factors like gender and age. Symptoms may include: °· Chest pain. It may feel  like: °? Crushing or squeezing. °? Tightness, pressure, fullness, or heaviness. °· Pain in the arm, neck, jaw, back, or upper body. °· Shortness of breath. °· Heartburn or upset stomach. °· Nausea. °· Sudden cold sweats. °· Feeling tired. °· Sudden light-headedness. °How is this diagnosed? °This condition may be diagnosed through tests, such as: °· Electrocardiogram (ECG) to measure the electrical activity of your heart. °· Blood tests to check for cardiac markers. These chemicals are released by a damaged heart muscle. °· A test to evaluate blood flow and heart function (coronary angiogram). °· CT scan to see the heart more clearly. °· A test to evaluate the pumping action of the heart (echocardiogram). °How is this treated? °A heart attack must be treated as soon as possible. Treatment may include: °· Medicines to: °? Break up or dissolve blood clots (fibrinolytic therapy). °? Thin blood and help prevent blood clots. °? Treat blood pressure. °? Improve blood flow to the heart. °? Reduce pain. °? Reduce cholesterol. °· Angioplasty and stent placement. These are procedures to widen a blocked artery and keep it open. °· Coronary artery bypass graft, CABG, or open heart surgery. This enables blood to flow to the heart by going around the blocked part of the artery. °· Oxygen therapy if needed. °· Cardiac rehabilitation. This improves your health and well-being through exercise, education, and counseling. °Follow these instructions at home: °Medicines °· Take over-the-counter and prescription medicines only as told by your health care provider. °· Do not take the following medicines unless your health care provider says it is okay   to take them: ? NSAIDs, such as ibuprofen. ? Supplements that contain vitamin A, vitamin E, or both. ? Hormone replacement therapy that contains estrogen with or without progestin. Lifestyle   Do not use any products that contain nicotine or tobacco, such as cigarettes, e-cigarettes,  and chewing tobacco. If you need help quitting, ask your health care provider.  Avoid secondhand smoke.  Exercise regularly. Ask your health care provider about participating in a cardiac rehabilitation program that helps you start exercising safely after a heart attack.  Eat a heart-healthy diet. Your health care provider will tell you what foods to eat.  Maintain a healthy weight.  Learn ways to manage stress.  Do not use illegal drugs. Alcohol use  Do not drink alcohol if: ? Your health care provider tells you not to drink. ? You are pregnant, may be pregnant, or are planning to become pregnant.  If you drink alcohol: ? Limit how much you use to:  0-1 drink a day for women.  0-2 drinks a day for men. ? Be aware of how much alcohol is in your drink. In the [place] one drink equals one 12 oz bottle of beer (355 mL), one 5 oz glass of wine (148 mL), or one 1 oz glass of hard liquor (44 mL). General instructions  Work with your health care provider to manage any other conditions you have, such as high blood pressure or diabetes. These conditions affect your heart.  Get screened for depression, and seek treatment if needed.  Keep your vaccinations up to date. Get the flu vaccine every year.  Keep all follow-up visits as told by your health care provider. This is important. Contact a health care provider if:  You feel overwhelmed or sad.  You have trouble doing your daily activities. Get help right away if:  You have sudden, unexplained discomfort in your chest, arms, back, neck, jaw, or upper body.  You have shortness of breath.  You suddenly start to sweat or your skin gets clammy.  You feel nauseous or you vomit.  You have unexplained tiredness or weakness.  You suddenly feel light-headed or dizzy.  You notice your heart starts to beat fast or feels like it is skipping beats.  You have blood pressure that is higher than 180/120. These symptoms may represent a  serious problem that is an emergency. Do not wait to see if the symptoms will go away. Get medical help right away. Call your local emergency services (911 in the [place] Do not drive yourself to the hospital. Summary  A heart attack, also called myocardial infarction, is a condition that occurs when your heart does not get enough oxygen. This is caused by anything that blocks or reduces blood flow to the heart.  Treatment is a combination of medicines and surgeries, if needed, to open the blocked arteries and restore blood flow to the heart.  A heart attack is an emergency. Get help right away if you have sudden discomfort in your chest, arms, back, neck, jaw, or upper body. Seek help if you feel nauseous, you vomit, or you feel light-headed or dizzy. This information is not intended to replace advice given to you by your health care provider. Make sure you discuss any questions you have with your health care provider. Document Released: 08/09/2005 Document Revised: 11/16/2018 Document Reviewed: 11/20/2018 Elsevier Patient Education  2020 [employer]  Heart Failure, Self Care Heart failure is a serious condition. This sheet explains things you need to do  to take care of yourself at home. To help you stay as healthy as possible, you may be asked to change your diet, take certain medicines, and make other changes in your life. Your doctor may also give you more specific instructions. If you have problems or questions, call your doctor. What are the risks? Having heart failure makes it more likely for you to have some problems. These problems can get worse if you do not take good care of yourself. Problems may include:  Blood clotting problems. This may cause a stroke.  Damage to the kidneys, liver, or lungs.  Abnormal heart rhythms. Supplies needed:  Scale for weighing yourself.  Blood pressure monitor.  Notebook.  Medicines. How to care for yourself when you have heart  failure Medicines Take over-the-counter and prescription medicines only as told by your doctor. Take your medicines every day.  Do not stop taking your medicine unless your doctor tells you to do so.  Do not skip any medicines.  Get your prescriptions refilled before you run out of medicine. This is important. Eating and drinking   Eat heart-healthy foods. Talk with a diet specialist (dietitian) to create an eating plan.  Choose foods that: ? Have no trans fat. ? Are low in saturated fat and cholesterol.  Choose healthy foods, such as: ? Fresh or frozen fruits and vegetables. ? Fish. ? Low-fat (lean) meats. ? Legumes, such as beans, peas, and lentils. ? Fat-free or low-fat dairy products. ? Whole-grain foods. ? High-fiber foods.  Limit salt (sodium) if told by your doctor. Ask your diet specialist to tell you which seasonings are healthy for your heart.  Cook in healthy ways instead of frying. Healthy ways of cooking include roasting, grilling, broiling, baking, poaching, steaming, and stir-frying.  Limit how much fluid you drink, if told by your doctor. Alcohol use  Do not drink alcohol if: ? Your doctor tells you not to drink. ? Your heart was damaged by alcohol, or you have very bad heart failure. ? You are pregnant, may be pregnant, or are planning to become pregnant.  If you drink alcohol: ? Limit how much you use to:  0-1 drink a day for women.  0-2 drinks a day for men. ? Be aware of how much alcohol is in your drink. In the [place] one drink equals one 12 oz bottle of beer (355 mL), one 5 oz glass of wine (148 mL), or one 1 oz glass of hard liquor (44 mL). Lifestyle   Do not use any products that contain nicotine or tobacco, such as cigarettes, e-cigarettes, and chewing tobacco. If you need help quitting, ask your doctor. ? Do not use nicotine gum or patches before talking to your doctor.  Do not use illegal drugs.  Lose weight if told by your  doctor.  Do physical activity if told by your doctor. Talk to your doctor before you begin an exercise if: ? You are an older adult. ? You have very bad heart failure.  Learn to manage stress. If you need help, ask your doctor.  Get rehab (rehabilitation) to help you stay independent and to help with your quality of life.  Plan time to rest when you get tired. Check weight and blood pressure   Weigh yourself every day. This will help you to know if fluid is building up in your body. ? Weigh yourself every morning after you pee (urinate) and before you eat breakfast. ? Wear the same amount of clothing each  time. ? Write down your daily weight. Give your record to your doctor.  Check and write down your blood pressure as told by your doctor.  Check your pulse as told by your doctor. Dealing with very hot and very cold weather  If it is very hot: ? Avoid activities that take a lot of energy. ? Use air conditioning or fans, or find a cooler place. ? Avoid caffeine and alcohol. ? Wear clothing that is loose-fitting, lightweight, and light-colored.  If it is very cold: ? Avoid activities that take a lot of energy. ? Layer your clothes. ? Wear mittens or gloves, a hat, and a scarf when you go outside. ? Avoid alcohol. Follow these instructions at home:  Stay up to date with shots (vaccines). Get pneumococcal and flu (influenza) shots.  Keep all follow-up visits as told by your doctor. This is important. Contact a doctor if:  You gain weight quickly.  You have increasing shortness of breath.  You cannot do your normal activities.  You get tired easily.  You cough a lot.  You don't feel like eating or feel like you may vomit (nauseous).  You become puffy (swell) in your hands, feet, ankles, or belly (abdomen).  You cannot sleep well because it is hard to breathe.  You feel like your heart is beating fast (palpitations).  You get dizzy when you stand up. Get help  right away if:  You have trouble breathing.  You or someone else notices a change in your behavior, such as having trouble staying awake.  You have chest pain or discomfort.  You pass out (faint). These symptoms may be an emergency. Do not wait to see if the symptoms will go away. Get medical help right away. Call your local emergency services (911 in the [place] Do not drive yourself to the hospital. Summary  Heart failure is a serious condition. To care for yourself, you may have to change your diet, take medicines, and make other lifestyle changes.  Take your medicines every day. Do not stop taking them unless your doctor tells you to do so.  Eat heart-healthy foods, such as fresh or frozen fruits and vegetables, fish, lean meats, legumes, fat-free or low-fat dairy products, and whole-grain or high-fiber foods.  Ask your doctor if you can drink alcohol. You may have to stop alcohol use if you have very bad heart failure.  Contact your doctor if you gain weight quickly or feel that your heart is beating too fast. Get help right away if you pass out, or have chest pain or trouble breathing. This information is not intended to replace advice given to you by your health care provider. Make sure you discuss any questions you have with your health care provider. Document Released: 11/22/2018 Document Revised: 11/21/2018 Document Reviewed: 11/22/2018 Elsevier Patient Education  2020 [employer].

## 2019-07-25 NOTE — Clinical Social Work Note (Signed)
CSW was informed patient needed a new PCP since she just moved to the area.  CSW gave her phone number for [facility], and [facility] medical offices.  CSW also advised that patient call the phone number on her insurance card to find a location close by.  Jones Broom. [place], MSW, [place]  07/25/2019 2:04 PM

## 2019-07-27 ENCOUNTER — Telehealth (HOSPITAL_COMMUNITY): Payer: Self-pay

## 2019-07-27 NOTE — Discharge Summary (Signed)
[facility] Discharge Summary   Patient: Tonya [name] WLS:[id number]   PCP: [facility] DOB: [birth year]   Date of admission: 07/23/2019   Date of discharge: 07/25/2019     Discharge Diagnoses:  Principal diagnosis NSTEMI  Active Problems:   NSTEMI (non-ST elevated myocardial infarction) (HCC)   Hypothyroidism (acquired)   Admitted From: home Disposition:  Home   Recommendations for Outpatient Follow-up:  1. PCP: please follow up with Cardiology  2. Follow up LABS/TEST:  none  Follow-up Information    [facility] Cardiac and Pulmonary Rehab Follow up.   Specialty: Cardiac Rehabilitation Why: Your Cardiologist has referred you to outpatient Cardiac Rehab at [facility].  The Cardiac Rehab department will contact you by phone within one to two weeks after discharge to schedule your first appointment.   Contact information: [address] [id number] [place] [address] [phone]         Diet recommendation: Cardiac diet  Activity: The patient is advised to gradually reintroduce usual activities,as tolerated.  Discharge Condition: good  Code Status: Full code   History of present illness: As per the H and P dictated on admission, "Tonya [name] is a 53 y.o. female with medical history significant hypothyroidism comes to the hospital with complains of chest pain.  Patient states over the Thanksgiving she had substernal burning-like chest pain going across her chest lasting for several hours.  She took over-the-counter NSAIDs and then aspirin to help with it.  Later in the evening it subsided.  But this morning around 3 AM she woke up with more severe substernal chest pain radiating to her arms along with feeling of nausea and shortness of breath.  Due to the severity of pain she decided to come to the ER. Denies having any obvious motor pain in the past.  In the ER her EKG was slightly abnormal where it showed ST changes in the lateral and  posterior leads.  Troponin was elevated at 660.  Case was discussed by ER provider with cardiology who recommended admitting the patient on heparin drip and further care. COVID-19-negative  Patient also tells me she was seen at [facility] earlier this week for routine checkup and was told she may have congestion in her chest therefore she was placed on Z-Pak."  Hospital Course:  Summary of her active problems in the hospital is as following. Non-STEMI complete occlusion of right coronary artery with collaterals and significant stenoses of circumflex artery. Successful PCI and stent placement right coronary artery and left circumflex artery without complication High intensity cholesterol therapy Dual antiplatelet therapy Begin cardiac rehabilitation currently unable to use beta-blocker due to some bradycardia  Hypothyroidism -Continue Synthroid once confirmed by pharmacy  Morbid Obesity  Body mass index is 53.37 kg/m.  Dietary consultation outpatient Would benefit from outpatient sleep study  Hyperlipidemia  Added lipitor Will need repeat labs in 1 month  Ischemic cardiomyopathy EF 45 to 50 Added lisinopril  Patient was ambulatory without any assistance. On the day of the discharge the patient's vitals were stable, and no other acute medical condition were reported by patient. the patient was felt safe to be discharge at Home with no therapy needed on discharge.  Consultants: Cardiology Procedures: Cardiac Catheterization  Echocardiogram   DISCHARGE MEDICATION: Allergies as of 07/25/2019   No Known Allergies     Medication List    TAKE these medications   Armour Thyroid 180 MG tablet Generic drug: thyroid Take 180 mg by mouth daily.   aspirin  EC 81 MG tablet Take 1 tablet (81 mg total) by mouth daily.   atorvastatin 80 MG tablet Commonly known as: LIPITOR Take 1 tablet (80 mg total) by mouth daily at 6 PM.   azithromycin 250 MG tablet Commonly  known as: ZITHROMAX Take 250-500 mg by mouth as directed.   lisinopril 5 MG tablet Commonly known as: ZESTRIL Take 1 tablet (5 mg total) by mouth daily.   ticagrelor 90 MG Tabs tablet Commonly known as: BRILINTA Take 1 tablet (90 mg total) by mouth 2 (two) times daily.      No Known Allergies Discharge Instructions    AMB Referral to Cardiac Rehabilitation - Phase II   Complete by: As directed    Diagnosis:  Coronary Stents NSTEMI     After initial evaluation and assessments completed: Virtual Based Care may be provided alone or in conjunction with Phase 2 Cardiac Rehab based on patient barriers.: Yes   Amb Referral to Cardiac Rehabilitation   Complete by: As directed    Diagnosis:  NSTEMI Coronary Stents     After initial evaluation and assessments completed: Virtual Based Care may be provided alone or in conjunction with Phase 2 Cardiac Rehab based on patient barriers.: Yes   Diet - low sodium heart healthy   Complete by: As directed    Discharge instructions   Complete by: As directed    It is important that you read the given instructions as well as go over your medication list with RN to help you understand your care after this hospitalization.  Please follow-up with PCP in 1-2 weeks.  Please note that NO REFILLS for any discharge medications will be authorized once you are discharged, as it is imperative that you return to your primary care physician (or establish a relationship with a primary care physician if you do not have one) for your aftercare needs so that they can reassess your need for medications and monitor your lab values.  Please request your primary care physician to go over all Hospital Tests and Procedure/Radiological results at the follow up. Please get all Hospital records sent to your PCP by signing hospital release before you go home.   Do not take more than prescribed Pain, Sleep and Anxiety Medications.  You were cared for by a hospitalist during  your hospital stay. If you have any questions about your discharge medications or the care you received while you were in the hospital after you are discharged, you can call the unit @ you were admitted to and ask to speak with the hospitalist Lynden Oxford. Ask for Hospitalist on call if the hospitalist that took care of you is not available.   Once you are discharged, your primary care physician will handle any further medical issues.  You Must read complete instructions/literature along with all the possible adverse reactions/side effects for all the Medicines you take and that have been prescribed to you. Take any new Medicines after you have completely understood and accept all the possible adverse reactions/side effects.  If you have smoked or chewed Tobacco in the last 2 yrs please STOP smoking STOP any Recreational drug use.  If you drink alcohol, please safely reduce the use. Do not drive, operating heavy machinery, perform activities at heights, swimming or participation in water activities or provide baby sitting services under influence.  Wear Seat belts while driving.   Increase activity slowly   Complete by: As directed      Discharge Exam: Filed Weights  07/23/19 0425 07/24/19 0346 07/25/19 0453  Weight: (!) 142 kg (!) 142.3 kg (!) 141 kg   Vitals:   07/25/19 0730 07/25/19 0808  BP: (!) 148/55 136/63  Pulse: (!) 59 (!) 57  Resp: 19   Temp: 98.3 F (36.8 C)   SpO2: 96%    General: Appear in no distress, no Rash; Oral Mucosa Clear, moist. no Abnormal Mass Or lumps Cardiovascular: S1 and S2 Present, no Murmur, Respiratory: normal respiratory effort, Bilateral Air entry present and Clear to Auscultation, no Crackles, no wheezes Abdomen: Bowel Sound present, Soft and no tenderness, no hernia Extremities: no Pedal edema, no calf tenderness Neurology: alert and oriented to time, place, and person affect appropriate.  The results of significant diagnostics from this  hospitalization (including imaging, microbiology, ancillary and laboratory) are listed below for reference.    Significant Diagnostic Studies: Dg Chest 2 View  Result Date: 07/23/2019 CLINICAL DATA:  Chest pain. EXAM: CHEST - 2 VIEW COMPARISON:  None. FINDINGS: The heart size is exaggerated by low lung volumes. Lingular airspace disease is present. Minimal patchy opacities are present in the right middle or lower lobe as well. The upper lung fields are clear. Degenerative changes are noted at the shoulders bilaterally. IMPRESSION: 1. Lingular airspace disease compatible with pneumonia. 2. Minimal patchy opacities in the right middle or lower lobe likely represent atelectasis. Infection is not excluded. 3. Low lung volumes. Electronically Signed   By: Marin Robertshristopher  Mattern M.D.   On: 07/23/2019 05:43    Microbiology: Recent Results (from the past 240 hour(s))  SARS Coronavirus 2 by RT PCR (hospital order, performed in [facility] hospital lab) Nasopharyngeal Nasopharyngeal Swab     Status: None   Collection Time: 07/23/19  5:35 AM   Specimen: Nasopharyngeal Swab  Result Value Ref Range Status   SARS Coronavirus 2 NEGATIVE NEGATIVE Final    Comment: (NOTE) SARS-CoV-2 target nucleic acids are NOT DETECTED. The SARS-CoV-2 RNA is generally detectable in upper and lower respiratory specimens during the acute phase of infection. The lowest concentration of SARS-CoV-2 viral copies this assay can detect is 250 copies / mL. A negative result does not preclude SARS-CoV-2 infection and should not be used as the sole basis for treatment or other patient management decisions.  A negative result may occur with improper specimen collection / handling, submission of specimen other than nasopharyngeal swab, presence of viral mutation(s) within the areas targeted by this assay, and inadequate number of viral copies (<250 copies / mL). A negative result must be combined with clinical observations, patient  history, and epidemiological information. Fact Sheet for Patients:   BoilerBrush.com.cyhttps://www.fda.gov/media/136312/download Fact Sheet for Healthcare Providers: https://pope.com/https://www.fda.gov/media/136313/download This test is not yet approved or cleared  by the [place] FDA and has been authorized for detection and/or diagnosis of SARS-CoV-2 by FDA under an Emergency Use Authorization (EUA).  This EUA will remain in effect (meaning this test can be used) for the duration of the COVID-19 declaration under Section 564(b)(1) of the Act, 21 U.S.C. section 360bbb-3(b)(1), unless the authorization is terminated or revoked sooner. Performed at [facility], [address] [place], [place] [address]      Labs: CBC: Recent Labs  Lab 07/23/19 0432 07/24/19 0548 07/25/19 0530  WBC 8.6 8.1 8.2  HGB 14.1 13.2 13.4  HCT 40.8 38.0 38.6  MCV 89.9 90.7 89.6  PLT 241 233 241   Basic Metabolic Panel: Recent Labs  Lab 07/23/19 0432 07/24/19 0548 07/25/19 0530  NA 139 137 137  K 3.8 3.9 3.6  CL 103 104 104  CO2 27 24 24   GLUCOSE 182* 138* 131*  BUN 10 9 9   CREATININE 0.78 0.65 0.67  CALCIUM 9.7 8.9 9.1   Liver Function Tests: Recent Labs  Lab 07/24/19 0548  AST 28  ALT 22  ALKPHOS 51  BILITOT 1.4*  PROT 6.9  ALBUMIN 3.6   No results for input(s): LIPASE, AMYLASE in the last 168 hours. No results for input(s): AMMONIA in the last 168 hours. Cardiac Enzymes: No results for input(s): CKTOTAL, CKMB, CKMBINDEX, TROPONINI in the last 168 hours. BNP (last 3 results) No results for input(s): BNP in the last 8760 hours. CBG: Recent Labs  Lab 07/24/19 1159 07/24/19 1835 07/24/19 2110 07/25/19 0732 07/25/19 1132  GLUCAP 126* 165* 102* 121* 120*    Time spent: 35 minutes  Signed:  Berle Mull  [facility] 07/25/2019 2:24 PM

## 2019-07-27 NOTE — Telephone Encounter (Signed)
Pt called and wanted to know more about our cardiac rehab program let pt know.what we offer advised pt that her referral was at [facility] and if she wanted to come here I would have to switch her referral here but pt stated that she just wanted to compare sites. Advised pt that I would leave her referral in [place] wq and that if she wanted to change just give [place] a call.

## 2019-08-08 ENCOUNTER — Encounter: Payer: Self-pay | Admitting: Internal Medicine

## 2019-08-08 ENCOUNTER — Ambulatory Visit (INDEPENDENT_AMBULATORY_CARE_PROVIDER_SITE_OTHER): Payer: 59 | Admitting: Internal Medicine

## 2019-08-08 ENCOUNTER — Encounter: Payer: Self-pay | Admitting: *Deleted

## 2019-08-08 ENCOUNTER — Other Ambulatory Visit: Payer: Self-pay

## 2019-08-08 ENCOUNTER — Encounter: Payer: 59 | Attending: Internal Medicine | Admitting: *Deleted

## 2019-08-08 ENCOUNTER — Encounter

## 2019-08-08 VITALS — BP 149/86 | HR 66 | Ht 64.0 in | Wt 301.2 lb

## 2019-08-08 DIAGNOSIS — I2510 Atherosclerotic heart disease of native coronary artery without angina pectoris: Secondary | ICD-10-CM | POA: Insufficient documentation

## 2019-08-08 DIAGNOSIS — I255 Ischemic cardiomyopathy: Secondary | ICD-10-CM

## 2019-08-08 DIAGNOSIS — Z955 Presence of coronary angioplasty implant and graft: Secondary | ICD-10-CM

## 2019-08-08 DIAGNOSIS — Z79899 Other long term (current) drug therapy: Secondary | ICD-10-CM | POA: Insufficient documentation

## 2019-08-08 DIAGNOSIS — Z79890 Hormone replacement therapy (postmenopausal): Secondary | ICD-10-CM | POA: Insufficient documentation

## 2019-08-08 DIAGNOSIS — Z7982 Long term (current) use of aspirin: Secondary | ICD-10-CM | POA: Insufficient documentation

## 2019-08-08 DIAGNOSIS — E039 Hypothyroidism, unspecified: Secondary | ICD-10-CM | POA: Insufficient documentation

## 2019-08-08 DIAGNOSIS — I214 Non-ST elevation (NSTEMI) myocardial infarction: Secondary | ICD-10-CM | POA: Diagnosis not present

## 2019-08-08 DIAGNOSIS — E785 Hyperlipidemia, unspecified: Secondary | ICD-10-CM

## 2019-08-08 DIAGNOSIS — I10 Essential (primary) hypertension: Secondary | ICD-10-CM | POA: Diagnosis not present

## 2019-08-08 DIAGNOSIS — E1159 Type 2 diabetes mellitus with other circulatory complications: Secondary | ICD-10-CM

## 2019-08-08 DIAGNOSIS — E079 Disorder of thyroid, unspecified: Secondary | ICD-10-CM | POA: Insufficient documentation

## 2019-08-08 DIAGNOSIS — Z7902 Long term (current) use of antithrombotics/antiplatelets: Secondary | ICD-10-CM | POA: Insufficient documentation

## 2019-08-08 MED ORDER — NITROGLYCERIN 0.4 MG SL SUBL: 0.4000 mg | SUBLINGUAL_TABLET | SUBLINGUAL | 3 refills | Status: DC | PRN

## 2019-08-08 MED ORDER — ATORVASTATIN CALCIUM 40 MG PO TABS: 40.0000 mg | ORAL_TABLET | Freq: Every day | ORAL | 3 refills | Status: DC

## 2019-08-08 MED ORDER — TICAGRELOR 90 MG PO TABS: 90.0000 mg | ORAL_TABLET | Freq: Two times a day (BID) | ORAL | 2 refills | Status: DC

## 2019-08-08 MED ORDER — LISINOPRIL 5 MG PO TABS: 5.0000 mg | ORAL_TABLET | Freq: Every day | ORAL | 3 refills | Status: DC

## 2019-08-08 NOTE — Progress Notes (Signed)
Completed virtuatl orientation today.  Documentation for diagnosis can be found in [facility] encounter 07/23/19.  EP eval scheduled for 12/17 at 93.  Pt has f/u with Dr. Saunders Revel today!

## 2019-08-08 NOTE — Patient Instructions (Addendum)
Medication Instructions:  Your physician has recommended you make the following change in your medication:  1- RESTART Lisinopril 5 mg by mouth once a day. 2- START Atorvastatin 40 mg by mouth once a day in the evening. 3- Nitroglycerin as needed for chest pain - Dissolve 1 tablet (0.4 mg) under your tongue every 5 minutes as needed for chest pain. Do not take more than 3 doses. If chest pain does not resolve, then call 911 or go to the Emergency Room.   *If you need a refill on your cardiac medications before your next appointment, please call your pharmacy*  Lab Work: none If you have labs (blood work) drawn today and your tests are completely normal, you will receive your results only by: Marland Kitchen MyChart Message (if you have MyChart) OR . A paper copy in the mail If you have any lab test that is abnormal or we need to change your treatment, we will call you to review the results.  Testing/Procedures: none  Follow-Up: At [facility], you and your health needs are our priority.  As part of our continuing mission to provide you with exceptional heart care, we have created designated Provider Care Teams.  These Care Teams include your primary Cardiologist (physician) and Advanced Practice Providers (APPs -  Physician Assistants and Nurse Practitioners) who all work together to provide you with the care you need, when you need it.  Your next appointment:   1 month(s)  The format for your next appointment:   In Person  Provider:    You may see DR Harrell Gave END or one of the following Advanced Practice Providers on your designated Care Team:    Murray Hodgkins, NP  Christell Faith, PA-C  Marrianne Mood, PA-C

## 2019-08-08 NOTE — Progress Notes (Signed)
New Outpatient Visit Date: 08/08/2019  [facility] [address] [place] [id number]  Chief Complaint: Coronary artery disease  HPI:  [name] is a 53 y.o. female who is being seen today for the evaluation of coronary artery disease.  She has a history of hypertension, hyperlipidemia, and diabetes mellitus.  She was hospitalized earlier this month with NSTEMI.  She was evaluated during her hospital stay by Dr. Gwen PoundsKowalski [facility]) and taken for left heart catheterization.  This revealed two-vessel coronary artery disease, including occlusion of the mid RCA and 90% mid LCx stenosis.  She underwent PCI to both lesions. She wishes to transfer ongoing cardiac care to our practice.  The first two nights after leaving the hospital (~2 weeks ago), [name] awoke several times with her heart racing and feeling out of breath.  This has not recurred.  She denies chest pain, shortness of breath, lightheadedness, and edema.  She initially had some dyspnea associated with ticagrelor, though this has now resolved.  She also developed abdominal discomfort and diarrhea after returning home.  She was concerned that this could be a side effect of one of the new medications she was started on; she subsequently stopped taking atorvastatin and lisinopril with resolution of the symptoms.  --------------------------------------------------------------------------------------------------  Cardiovascular History & Procedures: Cardiovascular Problems:  Coronary artery disease status post NSTEMI (07/2019)  Risk Factors:  Known coronary artery disease, hypertension, hyperlipidemia, and diabetes mellitus.  Cath/PCI:  LHC/PCI (07/24/2019): LMCA normal.  LAD with 40% mid vessel stenosis.  LCx with sequential 90% mid and 30 to 40% distal lesions.  Dominant RCA with 100% mid vessel occlusion followed by 80% stenosis in the mid to distal vessel.  LVEF 50% with  inferolateral hypokinesis.  Successful PCI to mid RCA using overlapping Resolute Onyx 3.0 x 12 mm and 2.75 x 38 mm drug-eluting stents.  Successful PCI to mid/distal LCx using Resolute Onyx 3.0 x 26 mm drug-eluting stent.  CV Surgery:  None  EP Procedures and Devices:  None  Non-Invasive Evaluation(s):  TTE (07/24/2019): Technically difficult study.  Normal LV size with LVEF 45-50%.  Normal RV size and function.  Mild mitral regurgitation.  Recent CV Pertinent Labs: Lab Results  Component Value Date   CHOL 197 07/23/2019   HDL 53 07/23/2019   LDLCALC 122 (H) 07/23/2019   TRIG 112 07/23/2019   CHOLHDL 3.7 07/23/2019   INR 1.0 07/23/2019   K 3.6 07/25/2019   BUN 9 07/25/2019   CREATININE 0.67 07/25/2019    --------------------------------------------------------------------------------------------------  Past Medical History:  Diagnosis Date  . Coronary artery disease   . Hypothyroidism   . Thyroid disease     Past Surgical History:  Procedure Laterality Date  . CARDIAC CATHETERIZATION    . CESAREAN SECTION    . CORONARY STENT INTERVENTION N/A 07/24/2019   Procedure: CORONARY STENT INTERVENTION;  Surgeon: Yvonne KendallEnd, Shelli Portilla, MD;  Location: [facility] INVASIVE CV LAB;  Service: Cardiovascular;  Laterality: N/A;  RCA & CFX  . LEFT HEART CATH AND CORONARY ANGIOGRAPHY N/A 07/24/2019   Procedure: LEFT HEART CATH AND CORONARY ANGIOGRAPHY;  Surgeon: Lamar BlinksKowalski, Bruce J, MD;  Location: [facility] INVASIVE CV LAB;  Service: Cardiovascular;  Laterality: N/A;  . TONSILLECTOMY      Current Meds  Medication Sig  . aspirin EC 81 MG tablet Take 1 tablet (81 mg total) by mouth daily.  Marland Kitchen. thyroid (ARMOUR) 120 MG tablet Take 120 mg by mouth daily before breakfast.  .  ticagrelor (BRILINTA) 90 MG TABS tablet Take 1 tablet (90 mg total) by mouth 2 (two) times daily.    Allergies: Patient has no known allergies.  Social History   Tobacco Use  . Smoking status: Never Smoker  . Smokeless tobacco:  Never Used  Substance Use Topics  . Alcohol use: Not Currently    Comment: occ  . Drug use: Never    Family History  Problem Relation Age of Onset  . Heart Problems Father   . Heart attack Father   . Heart disease Father     Review of Systems: A 12-system review of systems was performed and was negative except as noted in the HPI.  --------------------------------------------------------------------------------------------------  Physical Exam: BP (!) 149/86 (BP Location: Right Arm, Patient Position: Sitting, Cuff Size: Large)   Pulse 66   Ht 5\' 4"  (1.626 m)   Wt (!) 301 lb 4 oz (136.6 kg)   LMP 07/22/2016   SpO2 98%   BMI 51.71 kg/m   General:  NAD. HEENT: No conjunctival pallor or scleral icterus. Facemask in place. Neck: Supple without lymphadenopathy, thyromegaly, JVD, or HJR, though evaluation is limited by body habitus. Lungs: Normal work of breathing. Clear to auscultation bilaterally without wheezes or crackles. Heart: Distant heart sounds.  Regular rate and rhythm without murmurs, rubs, or gallops. Abd: Bowel sounds present. Soft, NT/ND. Ext: No lower extremity edema. 2+ radial pulses.  Right radial arteriotomy site is well-healed. Skin: Warm and dry without rash. Neuro: CNIII-XII intact. Strength and fine-touch sensation intact in upper and lower extremities bilaterally. Psych: Normal mood and affect.  EKG:  NSR without abnormality.  Lab Results  Component Value Date   WBC 8.2 07/25/2019   HGB 13.4 07/25/2019   HCT 38.6 07/25/2019   MCV 89.6 07/25/2019   PLT 241 07/25/2019    Lab Results  Component Value Date   NA 137 07/25/2019   K 3.6 07/25/2019   CL 104 07/25/2019   CO2 24 07/25/2019   BUN 9 07/25/2019   CREATININE 0.67 07/25/2019   GLUCOSE 131 (H) 07/25/2019   ALT 22 07/24/2019    Lab Results  Component Value Date   CHOL 197 07/23/2019   HDL 53 07/23/2019   LDLCALC 122 (H) 07/23/2019   TRIG 112 07/23/2019   CHOLHDL 3.7 07/23/2019      --------------------------------------------------------------------------------------------------  ASSESSMENT AND PLAN: NSTEMI: Ms. Maxim is recovering well from her NSTEMI and two-vessel PCI (RCA and LCx) earlier this month.  She will need to complete 12 months of DAPT with aspirin and ticagrelor.  We will also need to work on secondary prevention, including BP and lipid control as well as weight loss.  Ms. Quang is looking forward to starting cardiac rehab tomorrow.  Ischemic cardiomyopathy: LVEF noted to be mildly decreased in the setting of recent NSTEMI.  We have agreed to restart lisinopril 5 mg daily.  We will defer adding a beta blocker given resting heart rate in the 60's.  Hypertension: BP not well controlled today.  I doubt that her recent GI symptoms were due to lisinopril.  I have recommended that she start taking lisinopril 5 mg daily again.  Hyperlipidemia: Ms. Enslow developed vague abdominal discomfort and diarrhea on atorvastatin 80 mg daily.  We have agreed to restart atorvastatin at 40 mg daily.  If symptoms recur, I would favor transitioning to rosuvastatin with gradual dose escalation, as tolerated, to achieve an LDL of less than 70.  Diabetes mellitus: Hemoglobin A1c noted to be 7.7  during recent admission.  Ms. Ament is not currently on pharmacotherapy.  I recommend that she discuss this further with her PCP.  Morbid obesity: BMI > 50 with multiple comorbidities.  Weight loss encouraged through diet and exercise.  Follow-up: Return to clinic in 1 month.  Nelva Bush, MD 08/08/2019 2:35 PM

## 2019-08-09 ENCOUNTER — Encounter: Payer: 59 | Admitting: *Deleted

## 2019-08-09 ENCOUNTER — Encounter: Payer: Self-pay | Admitting: Internal Medicine

## 2019-08-09 VITALS — Ht 64.4 in | Wt 302.9 lb

## 2019-08-09 DIAGNOSIS — E079 Disorder of thyroid, unspecified: Secondary | ICD-10-CM | POA: Diagnosis not present

## 2019-08-09 DIAGNOSIS — Z79890 Hormone replacement therapy (postmenopausal): Secondary | ICD-10-CM | POA: Diagnosis not present

## 2019-08-09 DIAGNOSIS — E119 Type 2 diabetes mellitus without complications: Secondary | ICD-10-CM | POA: Insufficient documentation

## 2019-08-09 DIAGNOSIS — Z7902 Long term (current) use of antithrombotics/antiplatelets: Secondary | ICD-10-CM | POA: Diagnosis not present

## 2019-08-09 DIAGNOSIS — I214 Non-ST elevation (NSTEMI) myocardial infarction: Secondary | ICD-10-CM | POA: Diagnosis present

## 2019-08-09 DIAGNOSIS — I10 Essential (primary) hypertension: Secondary | ICD-10-CM | POA: Insufficient documentation

## 2019-08-09 DIAGNOSIS — E1169 Type 2 diabetes mellitus with other specified complication: Secondary | ICD-10-CM | POA: Insufficient documentation

## 2019-08-09 DIAGNOSIS — Z955 Presence of coronary angioplasty implant and graft: Secondary | ICD-10-CM | POA: Diagnosis not present

## 2019-08-09 DIAGNOSIS — E039 Hypothyroidism, unspecified: Secondary | ICD-10-CM | POA: Diagnosis not present

## 2019-08-09 DIAGNOSIS — Z79899 Other long term (current) drug therapy: Secondary | ICD-10-CM | POA: Diagnosis not present

## 2019-08-09 DIAGNOSIS — I255 Ischemic cardiomyopathy: Secondary | ICD-10-CM | POA: Insufficient documentation

## 2019-08-09 DIAGNOSIS — Z7982 Long term (current) use of aspirin: Secondary | ICD-10-CM | POA: Diagnosis not present

## 2019-08-09 DIAGNOSIS — I2510 Atherosclerotic heart disease of native coronary artery without angina pectoris: Secondary | ICD-10-CM | POA: Diagnosis not present

## 2019-08-09 DIAGNOSIS — E785 Hyperlipidemia, unspecified: Secondary | ICD-10-CM | POA: Insufficient documentation

## 2019-08-09 NOTE — Patient Instructions (Signed)
Patient Instructions  Patient Details  Name: Tonya [name] MRN: [id number] Date of Birth: August 11, 1966 Referring Provider:  Nelva Bush, MD  Below are your personal goals for exercise, nutrition, and risk factors. Our goal is to help you stay on track towards obtaining and maintaining these goals. We will be discussing your progress on these goals with you throughout the program.  Initial Exercise Prescription: Initial Exercise Prescription - 08/09/19 1200      Date of Initial Exercise RX and Referring Provider   Date  08/09/19    Referring Provider  End, Harrell Gave MD      Treadmill   MPH  1.7    Grade  0.5    Minutes  15    METs  2.42      Recumbant Bike   Level  1    Watts  20    Minutes  15    METs  2.4      NuStep   Level  1    SPM  80    Minutes  15    METs  2      REL-XR   Level  1    Speed  20    Minutes  15    METs  2      T5 Nustep   Level  1    SPM  80    Minutes  15    METs  2      Prescription Details   Frequency (times per week)  2    Duration  Progress to 30 minutes of continuous aerobic without signs/symptoms of physical distress      Intensity   THRR 40-80% of Max Heartrate  105-146    Ratings of Perceived Exertion  11-13    Perceived Dyspnea  0-4      Progression   Progression  Continue to progress workloads to maintain intensity without signs/symptoms of physical distress.      Resistance Training   Training Prescription  Yes    Weight  3 lb    Reps  10-15       Exercise Goals: Frequency: Be able to perform aerobic exercise two to three times per week in program working toward 2-5 days per week of home exercise.  Intensity: Work with a perceived exertion of 11 (fairly light) - 15 (hard) while following your exercise prescription.  We will make changes to your prescription with you as you progress through the program.   Duration: Be able to do 30 to 45 minutes of continuous aerobic exercise in addition to a 5 minute warm-up and  a 5 minute cool-down routine.   Nutrition Goals: Your personal nutrition goals will be established when you do your nutrition analysis with the dietician.  The following are general nutrition guidelines to follow: Cholesterol < 200mg /day Sodium < 1500mg /day Fiber: Women over 50 yrs - 21 grams per day  Personal Goals: Personal Goals and Risk Factors at Admission - 08/09/19 1241      Core Components/Risk Factors/Patient Goals on Admission    Weight Management  Yes;Obesity;Weight Loss    Intervention  Weight Management: Develop a combined nutrition and exercise program designed to reach desired caloric intake, while maintaining appropriate intake of nutrient and fiber, sodium and fats, and appropriate energy expenditure required for the weight goal.;Weight Management: Provide education and appropriate resources to help participant work on and attain dietary goals.;Weight Management/Obesity: Establish reasonable short term and long term weight goals.;Obesity: Provide education and appropriate resources  to help participant work on and attain dietary goals.    Admit Weight  302 lb 14.4 oz (137.4 kg)    Goal Weight: Short Term  295 lb (133.8 kg)    Goal Weight: Long Term  290 lb (131.5 kg)    Expected Outcomes  Short Term: Continue to assess and modify interventions until short term weight is achieved;Long Term: Adherence to nutrition and physical activity/exercise program aimed toward attainment of established weight goal;Weight Loss: Understanding of general recommendations for a balanced deficit meal plan, which promotes 1-2 lb weight loss per week and includes a negative energy balance of [phone] kcal/d;Understanding recommendations for meals to include 15-35% energy as protein, 25-35% energy from fat, 35-60% energy from carbohydrates, less than 200mg  of dietary cholesterol, 20-35 gm of total fiber daily;Understanding of distribution of calorie intake throughout the day with the consumption of 4-5  meals/snacks    Hypertension  Yes    Intervention  Provide education on lifestyle modifcations including regular physical activity/exercise, weight management, moderate sodium restriction and increased consumption of fresh fruit, vegetables, and low fat dairy, alcohol moderation, and smoking cessation.;Monitor prescription use compliance.    Expected Outcomes  Short Term: Continued assessment and intervention until BP is < 140/3390mm HG in hypertensive participants. < 130/6480mm HG in hypertensive participants with diabetes, heart failure or chronic kidney disease.;Long Term: Maintenance of blood pressure at goal levels.    Lipids  Yes    Intervention  Provide education and support for participant on nutrition & aerobic/resistive exercise along with prescribed medications to achieve LDL 70mg , HDL >40mg .    Expected Outcomes  Short Term: Participant states understanding of desired cholesterol values and is compliant with medications prescribed. Participant is following exercise prescription and nutrition guidelines.;Long Term: Cholesterol controlled with medications as prescribed, with individualized exercise RX and with personalized nutrition plan. Value goals: LDL < 70mg , HDL > 40 mg.       Tobacco Use Initial Evaluation: Social History   Tobacco Use  Smoking Status Never Smoker  Smokeless Tobacco Never Used    Exercise Goals and Review: Exercise Goals    Row Name 08/09/19 1239             Exercise Goals   Increase Physical Activity  Yes       Intervention  Provide advice, education, support and counseling about physical activity/exercise needs.;Develop an individualized exercise prescription for aerobic and resistive training based on initial evaluation findings, risk stratification, comorbidities and participant's personal goals.       Expected Outcomes  Short Term: Attend rehab on a regular basis to increase amount of physical activity.;Long Term: Add in home exercise to make exercise  part of routine and to increase amount of physical activity.;Long Term: Exercising regularly at least 3-5 days a week.       Increase Strength and Stamina  Yes       Intervention  Provide advice, education, support and counseling about physical activity/exercise needs.;Develop an individualized exercise prescription for aerobic and resistive training based on initial evaluation findings, risk stratification, comorbidities and participant's personal goals.       Expected Outcomes  Short Term: Increase workloads from initial exercise prescription for resistance, speed, and METs.;Short Term: Perform resistance training exercises routinely during rehab and add in resistance training at home;Long Term: Improve cardiorespiratory fitness, muscular endurance and strength as measured by increased METs and functional capacity (6MWT)       Able to understand and use rate of perceived exertion (RPE) scale  Yes       Intervention  Provide education and explanation on how to use RPE scale       Expected Outcomes  Short Term: Able to use RPE daily in rehab to express subjective intensity level;Long Term:  Able to use RPE to guide intensity level when exercising independently       Able to understand and use Dyspnea scale  Yes       Intervention  Provide education and explanation on how to use Dyspnea scale       Expected Outcomes  Short Term: Able to use Dyspnea scale daily in rehab to express subjective sense of shortness of breath during exertion;Long Term: Able to use Dyspnea scale to guide intensity level when exercising independently       Knowledge and understanding of Target Heart Rate Range (THRR)  Yes       Intervention  Provide education and explanation of THRR including how the numbers were predicted and where they are located for reference       Expected Outcomes  Short Term: Able to state/look up THRR;Short Term: Able to use daily as guideline for intensity in rehab;Long Term: Able to use THRR to govern  intensity when exercising independently       Able to check pulse independently  Yes       Intervention  Provide education and demonstration on how to check pulse in carotid and radial arteries.;Review the importance of being able to check your own pulse for safety during independent exercise       Expected Outcomes  Short Term: Able to explain why pulse checking is important during independent exercise;Long Term: Able to check pulse independently and accurately       Understanding of Exercise Prescription  Yes       Intervention  Provide education, explanation, and written materials on patient's individual exercise prescription       Expected Outcomes  Short Term: Able to explain program exercise prescription;Long Term: Able to explain home exercise prescription to exercise independently          Copy of goals given to participant.

## 2019-08-09 NOTE — Progress Notes (Signed)
Cardiac Individual Treatment Plan  Patient Details  Name: Tonya [name] MRN: [id number] Date of Birth: 11/26/1965 Referring Provider:     Cardiac Rehab from 08/09/2019 in [facility]  Referring Provider  End, Christopher MD      Initial Encounter Date:    Cardiac Rehab from 08/09/2019 in [facility]  Date  08/09/19      Visit Diagnosis: NSTEMI (non-ST elevated myocardial infarction) (HCC)  Status post coronary artery stent placement  Patient's Home Medications on Admission:  Current Outpatient Medications:  .  aspirin EC 81 MG tablet, Take 1 tablet (81 mg total) by mouth daily., Disp: 150 tablet, Rfl: 0 .  atorvastatin (LIPITOR) 40 MG tablet, Take 1 tablet (40 mg total) by mouth daily at 6 PM., Disp: 90 tablet, Rfl: 3 .  lisinopril (ZESTRIL) 5 MG tablet, Take 1 tablet (5 mg total) by mouth daily., Disp: 90 tablet, Rfl: 3 .  nitroGLYCERIN (NITROSTAT) 0.4 MG SL tablet, Place 1 tablet (0.4 mg total) under the tongue every 5 (five) minutes as needed for chest pain., Disp: 25 tablet, Rfl: 3 .  thyroid (ARMOUR) 120 MG tablet, Take 120 mg by mouth daily before breakfast., Disp: , Rfl:  .  ticagrelor (BRILINTA) 90 MG TABS tablet, Take 1 tablet (90 mg total) by mouth 2 (two) times daily., Disp: 180 tablet, Rfl: 2  Past Medical History: Past Medical History:  Diagnosis Date  . Coronary artery disease   . Hypothyroidism   . Thyroid disease     Tobacco Use: Social History   Tobacco Use  Smoking Status Never Smoker  Smokeless Tobacco Never Used    Labs: Recent Review Flowsheet Data    Labs for ITP Cardiac and Pulmonary Rehab Latest Ref Rng & Units 07/23/2019   Cholestrol 0 - 200 mg/dL 197   LDLCALC 0 - 99 mg/dL 122(H)   HDL >40 mg/dL 53   Trlycerides <150 mg/dL 112   Hemoglobin A1c 4.8 - 5.6 % 7.7(H)       Exercise Target Goals: Exercise Program Goal: Individual exercise prescription set using results from initial 6 min walk test  and THRR while considering  patient's activity barriers and safety.   Exercise Prescription Goal: Initial exercise prescription builds to 30-45 minutes a day of aerobic activity, 2-3 days per week.  Home exercise guidelines will be given to patient during program as part of exercise prescription that the participant will acknowledge.  Activity Barriers & Risk Stratification: Activity Barriers & Cardiac Risk Stratification - 08/09/19 1235      Activity Barriers & Cardiac Risk Stratification   Activity Barriers  Deconditioning;Muscular Weakness;Decreased Ventricular Function;Joint Problems   occasional knee pain   Cardiac Risk Stratification  Moderate       6 Minute Walk: 6 Minute Walk    Row Name 08/09/19 1233         6 Minute Walk   Phase  Initial     Distance  1230 feet     Walk Time  6 minutes     # of Rest Breaks  0     MPH  2.33     METS  2.46     RPE  11     VO2 Peak  8.59     Symptoms  Yes (comment)     Comments  hip pain 3/10 (irritating feeling)     Resting HR  65 bpm     Resting BP  124/64       Resting Oxygen Saturation   96 %     Exercise Oxygen Saturation  during 6 min walk  97 %     Max Ex. HR  113 bpm     Max Ex. BP  132/74     2 Minute Post BP  126/70        Oxygen Initial Assessment:   Oxygen Re-Evaluation:   Oxygen Discharge (Final Oxygen Re-Evaluation):   Initial Exercise Prescription: Initial Exercise Prescription - 08/09/19 1200      Date of Initial Exercise RX and Referring Provider   Date  08/09/19    Referring Provider  End, Harrell Gave MD      Treadmill   MPH  1.7    Grade  0.5    Minutes  15    METs  2.42      Recumbant Bike   Level  1    Watts  20    Minutes  15    METs  2.4      NuStep   Level  1    SPM  80    Minutes  15    METs  2      REL-XR   Level  1    Speed  20    Minutes  15    METs  2      T5 Nustep   Level  1    SPM  80    Minutes  15    METs  2      Prescription Details   Frequency (times per  week)  2    Duration  Progress to 30 minutes of continuous aerobic without signs/symptoms of physical distress      Intensity   THRR 40-80% of Max Heartrate  105-146    Ratings of Perceived Exertion  11-13    Perceived Dyspnea  0-4      Progression   Progression  Continue to progress workloads to maintain intensity without signs/symptoms of physical distress.      Resistance Training   Training Prescription  Yes    Weight  3 lb    Reps  10-15       Perform Capillary Blood Glucose checks as needed.  Exercise Prescription Changes: Exercise Prescription Changes    Row Name 08/09/19 1200             Response to Exercise   Blood Pressure (Admit)  124/64       Blood Pressure (Exercise)  132/74       Blood Pressure (Exit)  126/70       Heart Rate (Admit)  65 bpm       Heart Rate (Exercise)  113 bpm       Heart Rate (Exit)  68 bpm       Oxygen Saturation (Admit)  96 %       Oxygen Saturation (Exercise)  97 %       Rating of Perceived Exertion (Exercise)  11       Symptoms  hip pain 3/10       Comments  walk test results          Exercise Comments:   Exercise Goals and Review: Exercise Goals    Row Name 08/09/19 1239             Exercise Goals   Increase Physical Activity  Yes       Intervention  Provide advice, education, support and counseling about physical activity/exercise needs.;Develop  an individualized exercise prescription for aerobic and resistive training based on initial evaluation findings, risk stratification, comorbidities and participant's personal goals.       Expected Outcomes  Short Term: Attend rehab on a regular basis to increase amount of physical activity.;Long Term: Add in home exercise to make exercise part of routine and to increase amount of physical activity.;Long Term: Exercising regularly at least 3-5 days a week.       Increase Strength and Stamina  Yes       Intervention  Provide advice, education, support and counseling about physical  activity/exercise needs.;Develop an individualized exercise prescription for aerobic and resistive training based on initial evaluation findings, risk stratification, comorbidities and participant's personal goals.       Expected Outcomes  Short Term: Increase workloads from initial exercise prescription for resistance, speed, and METs.;Short Term: Perform resistance training exercises routinely during rehab and add in resistance training at home;Long Term: Improve cardiorespiratory fitness, muscular endurance and strength as measured by increased METs and functional capacity (6MWT)       Able to understand and use rate of perceived exertion (RPE) scale  Yes       Intervention  Provide education and explanation on how to use RPE scale       Expected Outcomes  Short Term: Able to use RPE daily in rehab to express subjective intensity level;Long Term:  Able to use RPE to guide intensity level when exercising independently       Able to understand and use Dyspnea scale  Yes       Intervention  Provide education and explanation on how to use Dyspnea scale       Expected Outcomes  Short Term: Able to use Dyspnea scale daily in rehab to express subjective sense of shortness of breath during exertion;Long Term: Able to use Dyspnea scale to guide intensity level when exercising independently       Knowledge and understanding of Target Heart Rate Range (THRR)  Yes       Intervention  Provide education and explanation of THRR including how the numbers were predicted and where they are located for reference       Expected Outcomes  Short Term: Able to state/look up THRR;Short Term: Able to use daily as guideline for intensity in rehab;Long Term: Able to use THRR to govern intensity when exercising independently       Able to check pulse independently  Yes       Intervention  Provide education and demonstration on how to check pulse in carotid and radial arteries.;Review the importance of being able to check your  own pulse for safety during independent exercise       Expected Outcomes  Short Term: Able to explain why pulse checking is important during independent exercise;Long Term: Able to check pulse independently and accurately       Understanding of Exercise Prescription  Yes       Intervention  Provide education, explanation, and written materials on patient's individual exercise prescription       Expected Outcomes  Short Term: Able to explain program exercise prescription;Long Term: Able to explain home exercise prescription to exercise independently          Exercise Goals Re-Evaluation :   Discharge Exercise Prescription (Final Exercise Prescription Changes): Exercise Prescription Changes - 08/09/19 1200      Response to Exercise   Blood Pressure (Admit)  124/64    Blood Pressure (Exercise)  132/74  Blood Pressure (Exit)  126/70    Heart Rate (Admit)  65 bpm    Heart Rate (Exercise)  113 bpm    Heart Rate (Exit)  68 bpm    Oxygen Saturation (Admit)  96 %    Oxygen Saturation (Exercise)  97 %    Rating of Perceived Exertion (Exercise)  11    Symptoms  hip pain 3/10    Comments  walk test results       Nutrition:  Target Goals: Understanding of nutrition guidelines, daily intake of sodium <1524m, cholesterol <2049m calories 30% from fat and 7% or less from saturated fats, daily to have 5 or more servings of fruits and vegetables.  Biometrics: Pre Biometrics - 08/09/19 1239      Pre Biometrics   Height  5' 4.4" (1.636 m)    Weight  (!) 302 lb 14.4 oz (137.4 kg)    BMI (Calculated)  51.33    Single Leg Stand  30 seconds        Nutrition Therapy Plan and Nutrition Goals:   Nutrition Assessments: Nutrition Assessments - 08/09/19 1240      MEDFICTS Scores   Pre Score  30       Nutrition Goals Re-Evaluation:   Nutrition Goals Discharge (Final Nutrition Goals Re-Evaluation):   Psychosocial: Target Goals: Acknowledge presence or absence of significant  depression and/or stress, maximize coping skills, provide positive support system. Participant is able to verbalize types and ability to use techniques and skills needed for reducing stress and depression.   Initial Review & Psychosocial Screening: Initial Psych Review & Screening - 08/08/19 0916      Initial Review   Current issues with  Current Stress Concerns    Source of Stress Concerns  Occupation    Comments  High stress job especially prior to their recent move.  Out of last 5 years, 3-4 have been very stressful.  Moved down here two years ago. 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  Sedentary job (6 hours straight except trips to bathroom) and not able to take lunch.  Realizes that she does not get up to move a lot and lunch is pushed back to 2pm on occasion.  She is not sure how to address this.  Her current job started in February and lacked training before start at home. Able to sleep well now.  She admits to feelings of palpitations at home right after coming home that woke her up at night.      Family Dynamics   Good Support System?  Yes   Mom, husband, son (in college)     Barriers   Psychosocial barriers to participate in program  The patient should benefit from training in stress management and relaxation.;Psychosocial barriers identified (see note)      Screening Interventions   Interventions  Encouraged to exercise;To provide support and resources with identified psychosocial needs;Provide feedback about the scores to participant    Expected Outcomes  Short Term goal: Utilizing psychosocial counselor, staff and physician to assist with identification of specific Stressors or current issues interfering with healing process. Setting desired goal for each stressor or current issue identified.;Long Term Goal: Stressors or current issues are controlled or eliminated.;Long Term goal: The participant  improves quality of Life and PHQ9 Scores as seen by post scores and/or verbalization of changes;Short Term goal: Identification and review with participant of any Quality of Life or Depression  concerns found by scoring the questionnaire.       Quality of Life Scores:  Quality of Life - 08/09/19 1240      Quality of Life   Select  Quality of Life      Quality of Life Scores   Health/Function Pre  14.4 %    Socioeconomic Pre  15 %    Psych/Spiritual Pre  13.21 %    Family Pre  22.8 %    GLOBAL Pre  15.6 %      Scores of 19 and below usually indicate a poorer quality of life in these areas.  A difference of  2-3 points is a clinically meaningful difference.  A difference of 2-3 points in the total score of the Quality of Life Index has been associated with significant improvement in overall quality of life, self-image, physical symptoms, and general health in studies assessing change in quality of life.  PHQ-9: Recent Review Flowsheet Data    Depression screen [facility] 2/9 08/09/2019   Decreased Interest 0   Down, Depressed, Hopeless 1   PHQ - 2 Score 1   Altered sleeping 0   Tired, decreased energy 1   Change in appetite 0   Feeling bad or failure about yourself  0   Trouble concentrating 0   Moving slowly or fidgety/restless 0   Suicidal thoughts 0   PHQ-9 Score 2   Difficult doing work/chores Not difficult at all     Interpretation of Total Score  Total Score Depression Severity:  1-4 = Minimal depression, 5-9 = Mild depression, 10-14 = Moderate depression, 15-19 = Moderately severe depression, 20-27 = Severe depression   Psychosocial Evaluation and Intervention: Psychosocial Evaluation - 08/08/19 0947      Psychosocial Evaluation & Interventions   Interventions  Stress management education;Encouraged to exercise with the program and follow exercise prescription    Comments  Tonya [name] is coming into rehab after a NSTEMI and DES x2.  She has had a very stressful couple of years,  especially this year.  She has a high stress job that she is considering changing.  She started her current job in [place] with limited training and was then left on her own at home.  She does not have time to get up to move much during the day either. She has been less active and doing more stress eating as well.  They originally moved down here for her husband's job.  But also the goal was to get away from the snow and see more seasons. She grew up coming down to [place] to visit and fish in the summers on her childhood and liked the area.  She has a great support system in her family.  Her mom is still in [place] and her son is in college.  They are planning to fill their son in on her recent events once he comes home after exams.  She is looking forward to the program. Prior to the pandemic she had joined the gym at [employer] and was using the weight circuit and had lost 10 lbs.  Unfornutately, she has stopped moving and regained all the weight back.  She is eager to get back into an exericse routine. She is hoping to get back to losing weight and improve her cardio fitness.  She is also looking for a plan to conitnue to move forward on her own too.    Expected Outcomes  Short: Attend rehab regularly for exercise.  Long: Work  on new skills for stress management.    Continue Psychosocial Services   Follow up required by staff       Psychosocial Re-Evaluation:   Psychosocial Discharge (Final Psychosocial Re-Evaluation):   Vocational Rehabilitation: Provide vocational rehab assistance to qualifying candidates.   Vocational Rehab Evaluation & Intervention: Vocational Rehab - 08/09/19 9675      Initial Vocational Rehab Evaluation & Intervention   Assessment shows need for Vocational Rehabilitation  Yes    Vocational Rehab Packet given to patient  08/09/19    Documents faxed to [facility] Dept of Vocational Rehabilitation  08/09/19       Education: Education Goals: Education  classes will be provided on a variety of topics geared toward better understanding of heart health and risk factor modification. Participant will state understanding/return demonstration of topics presented as noted by education test scores.  Learning Barriers/Preferences: Learning Barriers/Preferences - 08/08/19 0934      Learning Barriers/Preferences   Learning Barriers  Sight   Glasses   Learning Preferences  None       Education Topics:  AED/CPR: - Group verbal and written instruction with the use of models to demonstrate the basic use of the AED with the basic ABC's of resuscitation.   General Nutrition Guidelines/Fats and Fiber: -Group instruction provided by verbal, written material, models and posters to present the general guidelines for heart healthy nutrition. Gives an explanation and review of dietary fats and fiber.   Controlling Sodium/Reading Food Labels: -Group verbal and written material supporting the discussion of sodium use in heart healthy nutrition. Review and explanation with models, verbal and written materials for utilization of the food label.   Exercise Physiology & General Exercise Guidelines: - Group verbal and written instruction with models to review the exercise physiology of the cardiovascular system and associated critical values. Provides general exercise guidelines with specific guidelines to those with heart or lung disease.    Aerobic Exercise & Resistance Training: - Gives group verbal and written instruction on the various components of exercise. Focuses on aerobic and resistive training programs and the benefits of this training and how to safely progress through these programs..   Flexibility, Balance, Mind/Body Relaxation: Provides group verbal/written instruction on the benefits of flexibility and balance training, including mind/body exercise modes such as yoga, pilates and tai chi.  Demonstration and skill practice provided.   Stress  and Anxiety: - Provides group verbal and written instruction about the health risks of elevated stress and causes of high stress.  Discuss the correlation between heart/lung disease and anxiety and treatment options. Review healthy ways to manage with stress and anxiety.   Depression: - Provides group verbal and written instruction on the correlation between heart/lung disease and depressed mood, treatment options, and the stigmas associated with seeking treatment.   Anatomy & Physiology of the Heart: - Group verbal and written instruction and models provide basic cardiac anatomy and physiology, with the coronary electrical and arterial systems. Review of Valvular disease and Heart Failure   Cardiac Procedures: - Group verbal and written instruction to review commonly prescribed medications for heart disease. Reviews the medication, class of the drug, and side effects. Includes the steps to properly store meds and maintain the prescription regimen. (beta blockers and nitrates)   Cardiac Medications I: - Group verbal and written instruction to review commonly prescribed medications for heart disease. Reviews the medication, class of the drug, and side effects. Includes the steps to properly store meds and maintain the prescription regimen.  Cardiac Medications II: -Group verbal and written instruction to review commonly prescribed medications for heart disease. Reviews the medication, class of the drug, and side effects. (all other drug classes)    Go Sex-Intimacy & Heart Disease, Get SMART - Goal Setting: - Group verbal and written instruction through game format to discuss heart disease and the return to sexual intimacy. Provides group verbal and written material to discuss and apply goal setting through the application of the S.M.A.R.T. Method.   Other Matters of the Heart: - Provides group verbal, written materials and models to describe Stable Angina and Peripheral Artery. Includes  description of the disease process and treatment options available to the cardiac patient.   Exercise & Equipment Safety: - Individual verbal instruction and demonstration of equipment use and safety with use of the equipment.   Cardiac Rehab from 08/09/2019 in [facility] Cardiac and Pulmonary Rehab  Date  08/09/19  Educator  [facility]  Instruction Review Code  1- Verbalizes Understanding      Infection Prevention: - Provides verbal and written material to individual with discussion of infection control including proper hand washing and proper equipment cleaning during exercise session.   Cardiac Rehab from 08/09/2019 in [facility] Cardiac and Pulmonary Rehab  Date  08/09/19  Educator  [facility]  Instruction Review Code  1- Verbalizes Understanding      Falls Prevention: - Provides verbal and written material to individual with discussion of falls prevention and safety.   Cardiac Rehab from 08/09/2019 in [facility] Cardiac and Pulmonary Rehab  Date  08/09/19  Educator  [facility]  Instruction Review Code  1- Verbalizes Understanding      Diabetes: - Individual verbal and written instruction to review signs/symptoms of diabetes, desired ranges of glucose level fasting, after meals and with exercise. Acknowledge that pre and post exercise glucose checks will be done for 3 sessions at entry of program.   Know Your Numbers and Risk Factors: -Group verbal and written instruction about important numbers in your health.  Discussion of what are risk factors and how they play a role in the disease process.  Review of Cholesterol, Blood Pressure, Diabetes, and BMI and the role they play in your overall health.   Sleep Hygiene: -Provides group verbal and written instruction about how sleep can affect your health.  Define sleep hygiene, discuss sleep cycles and impact of sleep habits. Review good sleep hygiene tips.    Other: -Provides group and verbal instruction on various topics (see comments)   Knowledge Questionnaire  Score:   Core Components/Risk Factors/Patient Goals at Admission: Personal Goals and Risk Factors at Admission - 08/09/19 1241      Core Components/Risk Factors/Patient Goals on Admission    Weight Management  Yes;Obesity;Weight Loss    Intervention  Weight Management: Develop a combined nutrition and exercise program designed to reach desired caloric intake, while maintaining appropriate intake of nutrient and fiber, sodium and fats, and appropriate energy expenditure required for the weight goal.;Weight Management: Provide education and appropriate resources to help participant work on and attain dietary goals.;Weight Management/Obesity: Establish reasonable short term and long term weight goals.;Obesity: Provide education and appropriate resources to help participant work on and attain dietary goals.    Admit Weight  302 lb 14.4 oz (137.4 kg)    Goal Weight: Short Term  295 lb (133.8 kg)    Goal Weight: Long Term  290 lb (131.5 kg)    Expected Outcomes  Short Term: Continue to assess and modify interventions until short term  weight is achieved;Long Term: Adherence to nutrition and physical activity/exercise program aimed toward attainment of established weight goal;Weight Loss: Understanding of general recommendations for a balanced deficit meal plan, which promotes 1-2 lb weight loss per week and includes a negative energy balance of [phone] kcal/d;Understanding recommendations for meals to include 15-35% energy as protein, 25-35% energy from fat, 35-60% energy from carbohydrates, less than 277m of dietary cholesterol, 20-35 gm of total fiber daily;Understanding of distribution of calorie intake throughout the day with the consumption of 4-5 meals/snacks    Hypertension  Yes    Intervention  Provide education on lifestyle modifcations including regular physical activity/exercise, weight management, moderate sodium restriction and increased consumption of fresh fruit, vegetables, and low fat  dairy, alcohol moderation, and smoking cessation.;Monitor prescription use compliance.    Expected Outcomes  Short Term: Continued assessment and intervention until BP is < 140/944mHG in hypertensive participants. < 130/8030mG in hypertensive participants with diabetes, heart failure or chronic kidney disease.;Long Term: Maintenance of blood pressure at goal levels.    Lipids  Yes    Intervention  Provide education and support for participant on nutrition & aerobic/resistive exercise along with prescribed medications to achieve LDL <40m54mDL >40mg74m Expected Outcomes  Short Term: Participant states understanding of desired cholesterol values and is compliant with medications prescribed. Participant is following exercise prescription and nutrition guidelines.;Long Term: Cholesterol controlled with medications as prescribed, with individualized exercise RX and with personalized nutrition plan. Value goals: LDL < 40mg,57m > 40 mg.       Core Components/Risk Factors/Patient Goals Review:    Core Components/Risk Factors/Patient Goals at Discharge (Final Review):    ITP Comments: ITP Comments    Row Name 08/08/19 1010 08/09/19 1232         ITP Comments  Completed virtuatl orientation today.  [facility]  EP eval scheduled for 12/17 at 93.  Pt has f/u with Dr. End toSaunders Revel!  Completed 6MWT and gym orientation.  Initial ITP created and sent for review to Dr. Mark MEmily Filbertcal Director.         Comments: Initial ITP

## 2019-08-10 ENCOUNTER — Encounter: Payer: 59 | Admitting: *Deleted

## 2019-08-10 ENCOUNTER — Other Ambulatory Visit: Payer: Self-pay

## 2019-08-10 DIAGNOSIS — I214 Non-ST elevation (NSTEMI) myocardial infarction: Secondary | ICD-10-CM

## 2019-08-10 DIAGNOSIS — Z955 Presence of coronary angioplasty implant and graft: Secondary | ICD-10-CM

## 2019-08-10 NOTE — Progress Notes (Signed)
Daily Session Note  Patient Details  Name: Tonya [name] MRN: [id number] Date of Birth: Mar 07, 1966 Referring Provider:     Cardiac Rehab from 08/09/2019 in [facility] Cardiac [facility]monary Rehab  Referring Provider  End, Harrell Gave MD      Encounter Date: 08/10/2019  Check In: Session Check In - 08/10/19 1011      Check-In   Supervising physician immediately available to respond to emergencies  See telemetry face sheet for immediately available ER MD    Location  [facility]-Cardiac & Pulmonary Rehab    Staff Present  Renita Papa, RN BSN;Jessica Luan Pulling, MA, RCEP, CCRP, CCET;Joseph Sorrel RCP,RRT,BSRT    Virtual Visit  No    Medication changes reported      No    Fall or balance concerns reported     No    Warm-up and Cool-down  Performed on first and last piece of equipment    Resistance Training Performed  Yes    VAD Patient?  No    PAD/SET Patient?  No      Pain Assessment   Currently in Pain?  No/denies          Social History   Tobacco Use  Smoking Status Never Smoker  Smokeless Tobacco Never Used    Goals Met:  Independence with exercise equipment Exercise tolerated well No report of cardiac concerns or symptoms Strength training completed today  Goals Unmet:  Not Applicable  Comments: First full day of exercise!  Patient was oriented to gym and equipment including functions, settings, policies, and procedures.  Patient's individual exercise prescription and treatment plan were reviewed.  All starting workloads were established based on the results of the 6 minute walk test done at initial orientation visit.  The plan for exercise progression was also introduced and progression will be customized based on patient's performance and goals.    Dr. Emily Filbert is Medical Director for [place] and [facility].

## 2019-08-13 ENCOUNTER — Other Ambulatory Visit: Payer: Self-pay

## 2019-08-13 ENCOUNTER — Encounter: Payer: 59 | Admitting: *Deleted

## 2019-08-13 DIAGNOSIS — Z955 Presence of coronary angioplasty implant and graft: Secondary | ICD-10-CM

## 2019-08-13 DIAGNOSIS — I214 Non-ST elevation (NSTEMI) myocardial infarction: Secondary | ICD-10-CM

## 2019-08-13 NOTE — Progress Notes (Signed)
Daily Session Note  Patient Details  Name: Tonya [name] MRN: [id number] Date of Birth: 06-24-1966 Referring Provider:     Cardiac Rehab from 08/09/2019 in [facility] Cardiac and Pulmonary Rehab  Referring Provider  End, Harrell Gave MD      Encounter Date: 08/13/2019  Check In: Session Check In - 08/13/19 0955      Check-In   Supervising physician immediately available to respond to emergencies  See telemetry face sheet for immediately available ER MD    Location  [facility]-Cardiac & Pulmonary Rehab    Staff Present  Heath Lark, RN, BSN, CCRP;Joseph Hood RCP,RRT,BSRT;Kelly [place], [place], ACSM CEP, Exercise Physiologist    Virtual Visit  No    Medication changes reported      No    Fall or balance concerns reported     No    Warm-up and Cool-down  Performed on first and last piece of equipment    Resistance Training Performed  Yes    VAD Patient?  No    PAD/SET Patient?  No      Pain Assessment   Currently in Pain?  No/denies          Social History   Tobacco Use  Smoking Status Never Smoker  Smokeless Tobacco Never Used    Goals Met:  Independence with exercise equipment Exercise tolerated well No report of cardiac concerns or symptoms  Goals Unmet:  Not Applicable  Comments: Pt able to follow exercise prescription today without complaint.  Will continue to monitor for progression.    Dr. Emily Filbert is Medical Director for [place] and [facility].

## 2019-08-13 NOTE — Progress Notes (Signed)
Completed initial RD Eval 

## 2019-08-14 ENCOUNTER — Ambulatory Visit: Payer: 59 | Admitting: Nurse Practitioner

## 2019-08-14 ENCOUNTER — Encounter: Payer: 59 | Admitting: *Deleted

## 2019-08-14 DIAGNOSIS — I214 Non-ST elevation (NSTEMI) myocardial infarction: Secondary | ICD-10-CM | POA: Diagnosis not present

## 2019-08-14 DIAGNOSIS — Z955 Presence of coronary angioplasty implant and graft: Secondary | ICD-10-CM

## 2019-08-14 NOTE — Progress Notes (Signed)
Daily Session Note  Patient Details  Name: Tonya [name] MRN: [id number] Date of Birth: 1966/07/28 Referring Provider:     Cardiac Rehab from 08/09/2019 in [facility] Cardiac and Pulmonary Rehab  Referring Provider  End, Harrell Gave MD      Encounter Date: 08/14/2019  Check In: Session Check In - 08/14/19 0954      Check-In   Supervising physician immediately available to respond to emergencies  See telemetry face sheet for immediately available ER MD    Location  [facility]-Cardiac & Pulmonary Rehab    Staff Present  Heath Lark, RN, BSN, CCRP;Joseph Hood RCP,RRT,BSRT;Jessica [place], [place], [place], [place], CCET    Virtual Visit  No    Medication changes reported      No    Fall or balance concerns reported     No    Warm-up and Cool-down  Performed on first and last piece of equipment    Resistance Training Performed  Yes    VAD Patient?  No    PAD/SET Patient?  No      Pain Assessment   Currently in Pain?  No/denies          Social History   Tobacco Use  Smoking Status Never Smoker  Smokeless Tobacco Never Used    Goals Met:  Independence with exercise equipment Exercise tolerated well No report of cardiac concerns or symptoms  Goals Unmet:  Not Applicable  Comments: Pt able to follow exercise prescription today without complaint.  Will continue to monitor for progression.    Dr. Emily Filbert is Medical Director for [place] and [facility].

## 2019-08-22 ENCOUNTER — Encounter: Payer: Self-pay | Admitting: *Deleted

## 2019-08-22 DIAGNOSIS — Z955 Presence of coronary angioplasty implant and graft: Secondary | ICD-10-CM

## 2019-08-22 DIAGNOSIS — I214 Non-ST elevation (NSTEMI) myocardial infarction: Secondary | ICD-10-CM

## 2019-08-22 NOTE — Progress Notes (Signed)
Cardiac Individual Treatment Plan  Patient Details  Name: Tonya [name] MRN: [id number] Date of Birth: 05/09/1966 Referring Provider:     Cardiac Rehab from 08/09/2019 in [facility]  Referring Provider  End, Christopher MD      Initial Encounter Date:    Cardiac Rehab from 08/09/2019 in [facility]  Date  08/09/19      Visit Diagnosis: NSTEMI (non-ST elevated myocardial infarction) (HCC)  Status post coronary artery stent placement  Patient's Home Medications on Admission:  Current Outpatient Medications:  .  aspirin EC 81 MG tablet, Take 1 tablet (81 mg total) by mouth daily., Disp: 150 tablet, Rfl: 0 .  atorvastatin (LIPITOR) 40 MG tablet, Take 1 tablet (40 mg total) by mouth daily at 6 PM., Disp: 90 tablet, Rfl: 3 .  lisinopril (ZESTRIL) 5 MG tablet, Take 1 tablet (5 mg total) by mouth daily., Disp: 90 tablet, Rfl: 3 .  nitroGLYCERIN (NITROSTAT) 0.4 MG SL tablet, Place 1 tablet (0.4 mg total) under the tongue every 5 (five) minutes as needed for chest pain., Disp: 25 tablet, Rfl: 3 .  thyroid (ARMOUR) 120 MG tablet, Take 120 mg by mouth daily before breakfast., Disp: , Rfl:  .  ticagrelor (BRILINTA) 90 MG TABS tablet, Take 1 tablet (90 mg total) by mouth 2 (two) times daily., Disp: 180 tablet, Rfl: 2  Past Medical History: Past Medical History:  Diagnosis Date  . Coronary artery disease   . Hypothyroidism   . Thyroid disease     Tobacco Use: Social History   Tobacco Use  Smoking Status Never Smoker  Smokeless Tobacco Never Used    Labs: Recent Review Flowsheet Data    Labs for [facility] Cardiac and Pulmonary Rehab Latest Ref Rng & Units 07/23/2019   Cholestrol 0 - 200 mg/dL 197   LDLCALC 0 - 99 mg/dL 122(H)   HDL >40 mg/dL 53   Trlycerides <150 mg/dL 112   Hemoglobin A1c 4.8 - 5.6 % 7.7(H)       Exercise Target Goals: Exercise Program Goal: Individual exercise prescription set using results from initial 6 min walk test  and THRR while considering  patient's activity barriers and safety.   Exercise Prescription Goal: Initial exercise prescription builds to 30-45 minutes a day of aerobic activity, 2-3 days per week.  Home exercise guidelines will be given to patient during program as part of exercise prescription that the participant will acknowledge.  Activity Barriers & Risk Stratification: Activity Barriers & Cardiac Risk Stratification - 08/09/19 1235      Activity Barriers & Cardiac Risk Stratification   Activity Barriers  Deconditioning;Muscular Weakness;Decreased Ventricular Function;Joint Problems   occasional knee pain   Cardiac Risk Stratification  Moderate       6 Minute Walk: 6 Minute Walk    Row Name 08/09/19 1233         6 Minute Walk   Phase  Initial     Distance  1230 feet     Walk Time  6 minutes     # of Rest Breaks  0     MPH  2.33     METS  2.46     RPE  11     VO2 Peak  8.59     Symptoms  Yes (comment)     Comments  hip pain 3/10 (irritating feeling)     Resting HR  65 bpm     Resting BP  124/64       Resting Oxygen Saturation   96 %     Exercise Oxygen Saturation  during 6 min walk  97 %     Max Ex. HR  113 bpm     Max Ex. BP  132/74     2 Minute Post BP  126/70        Oxygen Initial Assessment:   Oxygen Re-Evaluation:   Oxygen Discharge (Final Oxygen Re-Evaluation):   Initial Exercise Prescription: Initial Exercise Prescription - 08/09/19 1200      Date of Initial Exercise RX and Referring Provider   Date  08/09/19    Referring Provider  End, Christopher MD      Treadmill   MPH  1.7    Grade  0.5    Minutes  15    METs  2.42      Recumbant Bike   Level  1    Watts  20    Minutes  15    METs  2.4      NuStep   Level  1    SPM  80    Minutes  15    METs  2      REL-XR   Level  1    Speed  20    Minutes  15    METs  2      T5 Nustep   Level  1    SPM  80    Minutes  15    METs  2      Prescription Details   Frequency (times per  week)  2    Duration  Progress to 30 minutes of continuous aerobic without signs/symptoms of physical distress      Intensity   THRR 40-80% of Max Heartrate  105-146    Ratings of Perceived Exertion  11-13    Perceived Dyspnea  0-4      Progression   Progression  Continue to progress workloads to maintain intensity without signs/symptoms of physical distress.      Resistance Training   Training Prescription  Yes    Weight  3 lb    Reps  10-15       Perform Capillary Blood Glucose checks as needed.  Exercise Prescription Changes: Exercise Prescription Changes    Row Name 08/09/19 1200 08/13/19 1000 08/15/19 1300         Response to Exercise   Blood Pressure (Admit)  124/64  --  166/78     Blood Pressure (Exercise)  132/74  --  158/74     Blood Pressure (Exit)  126/70  --  140/70     Heart Rate (Admit)  65 bpm  --  96 bpm     Heart Rate (Exercise)  113 bpm  --  119 bpm     Heart Rate (Exit)  68 bpm  --  84 bpm     Oxygen Saturation (Admit)  96 %  --  --     Oxygen Saturation (Exercise)  97 %  --  --     Rating of Perceived Exertion (Exercise)  11  --  11     Symptoms  hip pain 3/10  --  none     Comments  walk test results  --  third full day of exercise     Duration  --  --  Continue with 30 min of aerobic exercise without signs/symptoms of physical distress.     Intensity  --  --  THRR unchanged         Progression   Progression  --  --  Continue to progress workloads to maintain intensity without signs/symptoms of physical distress.     Average METs  --  --  2.17       Resistance Training   Training Prescription  --  Yes  Yes     Weight  --  3 lb  3 lb     Reps  --  10-15  10-15       Interval Training   Interval Training  --  --  No       Treadmill   MPH  --  1.7  1.7     Grade  --  0.5  0.5     Minutes  --  15  15     METs  --  2.42  2.42       Recumbant Bike   Level  --  1  1     Watts  --  20  22     Minutes  --  15  15     METs  --  2.4  2.47        NuStep   Level  --  1  --     SPM  --  80  --     Minutes  --  15  --     METs  --  2  --       REL-XR   Level  --  1  1     Speed  --  20  --     Minutes  --  15  15     METs  --  2  1.8       T5 Nustep   Level  --  1  1     SPM  --  80  --     Minutes  --  15  15     METs  --  2  2       Home Exercise Plan   Plans to continue exercise at  --  Home (comment) walking and videos  Home (comment) walking and videos     Frequency  --  Add 3 additional days to program exercise sessions.  Add 3 additional days to program exercise sessions.     Initial Home Exercises Provided  --  08/13/19  08/13/19        Exercise Comments:   Exercise Goals and Review: Exercise Goals    Row Name 08/09/19 1239             Exercise Goals   Increase Physical Activity  Yes       Intervention  Provide advice, education, support and counseling about physical activity/exercise needs.;Develop an individualized exercise prescription for aerobic and resistive training based on initial evaluation findings, risk stratification, comorbidities and participant's personal goals.       Expected Outcomes  Short Term: Attend rehab on a regular basis to increase amount of physical activity.;Long Term: Add in home exercise to make exercise part of routine and to increase amount of physical activity.;Long Term: Exercising regularly at least 3-5 days a week.       Increase Strength and Stamina  Yes       Intervention  Provide advice, education, support and counseling about physical activity/exercise needs.;Develop an individualized exercise prescription for aerobic and resistive training based on initial evaluation findings, risk stratification, comorbidities and participant's personal goals.         Expected Outcomes  Short Term: Increase workloads from initial exercise prescription for resistance, speed, and METs.;Short Term: Perform resistance training exercises routinely during rehab and add in resistance training at  home;Long Term: Improve cardiorespiratory fitness, muscular endurance and strength as measured by increased METs and functional capacity (6MWT)       Able to understand and use rate of perceived exertion (RPE) scale  Yes       Intervention  Provide education and explanation on how to use RPE scale       Expected Outcomes  Short Term: Able to use RPE daily in rehab to express subjective intensity level;Long Term:  Able to use RPE to guide intensity level when exercising independently       Able to understand and use Dyspnea scale  Yes       Intervention  Provide education and explanation on how to use Dyspnea scale       Expected Outcomes  Short Term: Able to use Dyspnea scale daily in rehab to express subjective sense of shortness of breath during exertion;Long Term: Able to use Dyspnea scale to guide intensity level when exercising independently       Knowledge and understanding of Target Heart Rate Range (THRR)  Yes       Intervention  Provide education and explanation of THRR including how the numbers were predicted and where they are located for reference       Expected Outcomes  Short Term: Able to state/look up THRR;Short Term: Able to use daily as guideline for intensity in rehab;Long Term: Able to use THRR to govern intensity when exercising independently       Able to check pulse independently  Yes       Intervention  Provide education and demonstration on how to check pulse in carotid and radial arteries.;Review the importance of being able to check your own pulse for safety during independent exercise       Expected Outcomes  Short Term: Able to explain why pulse checking is important during independent exercise;Long Term: Able to check pulse independently and accurately       Understanding of Exercise Prescription  Yes       Intervention  Provide education, explanation, and written materials on patient's individual exercise prescription       Expected Outcomes  Short Term: Able to explain  program exercise prescription;Long Term: Able to explain home exercise prescription to exercise independently          Exercise Goals Re-Evaluation : Exercise Goals Re-Evaluation    Row Name 08/10/19 1012 08/13/19 1022           Exercise Goal Re-Evaluation   Exercise Goals Review  Increase Physical Activity;Able to understand and use rate of perceived exertion (RPE) scale;Knowledge and understanding of Target Heart Rate Range (THRR);Understanding of Exercise Prescription;Increase Strength and Stamina;Able to check pulse independently  Increase Physical Activity;Increase Strength and Stamina;Able to check pulse independently;Able to understand and use rate of perceived exertion (RPE) scale;Knowledge and understanding of Target Heart Rate Range (THRR);Understanding of Exercise Prescription      Comments  Reviewed RPE scale, THR and program prescription with pt today.  Pt voiced understanding and was given a copy of goals to take home.  Reviewed home exercise with pt today.  Pt plans to walk and do exercise videos for exercise.  Reviewed THR, pulse, RPE, sign and symptoms, NTG use, and when to call 911 or MD.  Also discussed weather considerations and indoor options.  Pt voiced understanding.      Expected Outcomes  Short: Use RPE daily to regulate intensity. Long: Follow program prescription in THR.  Short: exercise 2-3 days at home. Long: become independent with exercise program         Discharge Exercise Prescription (Final Exercise Prescription Changes): Exercise Prescription Changes - 08/15/19 1300      Response to Exercise   Blood Pressure (Admit)  166/78    Blood Pressure (Exercise)  158/74    Blood Pressure (Exit)  140/70    Heart Rate (Admit)  96 bpm    Heart Rate (Exercise)  119 bpm    Heart Rate (Exit)  84 bpm    Rating of Perceived Exertion (Exercise)  11    Symptoms  none    Comments  third full day of exercise    Duration  Continue with 30 min of aerobic exercise without  signs/symptoms of physical distress.    Intensity  THRR unchanged      Progression   Progression  Continue to progress workloads to maintain intensity without signs/symptoms of physical distress.    Average METs  2.17      Resistance Training   Training Prescription  Yes    Weight  3 lb    Reps  10-15      Interval Training   Interval Training  No      Treadmill   MPH  1.7    Grade  0.5    Minutes  15    METs  2.42      Recumbant Bike   Level  1    Watts  22    Minutes  15    METs  2.47      REL-XR   Level  1    Minutes  15    METs  1.8      T5 Nustep   Level  1    Minutes  15    METs  2      Home Exercise Plan   Plans to continue exercise at  Home (comment)   walking and videos   Frequency  Add 3 additional days to program exercise sessions.    Initial Home Exercises Provided  08/13/19       Nutrition:  Target Goals: Understanding of nutrition guidelines, daily intake of sodium <1522m, cholesterol <2077m calories 30% from fat and 7% or less from saturated fats, daily to have 5 or more servings of fruits and vegetables.  Biometrics: Pre Biometrics - 08/09/19 1239      Pre Biometrics   Height  5' 4.4" (1.636 m)    Weight  (!) 302 lb 14.4 oz (137.4 kg)    BMI (Calculated)  51.33    Single Leg Stand  30 seconds        Nutrition Therapy Plan and Nutrition Goals: Nutrition Therapy & Goals - 08/13/19 1201      Nutrition Therapy   Diet  Low Na, HH    Protein (specify units)  110g    Fiber  25 grams    Whole Grain Foods  3 servings    Saturated Fats  12 max. grams    Fruits and Vegetables  5 servings/day    Sodium  1.5 grams      Personal Nutrition Goals   Nutrition Goal  ST: adding snacks before lunch LT: Sustainable lifestyle changes    Comments  Pt reports on thyroid medicine since the birth of her son. Been ~  300 lbs for about 10 years. Been pre-diabetic for years. 7.7 A1C. No breakfast, L: low CHO wrap, with no nitrate ham and [place] breast and  swiss with lettuce and tomato with spicy mustard or mayo. D: shrimp, green beans, cauliflower rice or pork loin with red potato and carrots or sirloin steak with baked potato with broccoli. takes metamusal and drinks water during the day, used to drink 1 L of soda of day (now one glass). Discussed HH and DM eating, Importance of CHO and complex CHOs. Discussed ways to sneak in food during her work day while it's busy.      Intervention Plan   Intervention  Prescribe, educate and counsel regarding individualized specific dietary modifications aiming towards targeted core components such as weight, hypertension, lipid management, diabetes, heart failure and other comorbidities.;Nutrition handout(s) given to patient.    Expected Outcomes  Short Term Goal: Understand basic principles of dietary content, such as calories, fat, sodium, cholesterol and nutrients.;Short Term Goal: A plan has been developed with personal nutrition goals set during dietitian appointment.;Long Term Goal: Adherence to prescribed nutrition plan.       Nutrition Assessments: Nutrition Assessments - 08/09/19 1240      MEDFICTS Scores   Pre Score  30       Nutrition Goals Re-Evaluation:   Nutrition Goals Discharge (Final Nutrition Goals Re-Evaluation):   Psychosocial: Target Goals: Acknowledge presence or absence of significant depression and/or stress, maximize coping skills, provide positive support system. Participant is able to verbalize types and ability to use techniques and skills needed for reducing stress and depression.   Initial Review & Psychosocial Screening: Initial Psych Review & Screening - 08/08/19 0916      Initial Review   Current issues with  Current Stress Concerns    Source of Stress Concerns  Occupation    Comments  High stress job especially prior to their recent move.  Out of last 5 years, 3-4 have been very stressful.  Moved down here two years ago. 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  Sedentary job (6 hours straight except trips to bathroom) and not able to take lunch.  Realizes that she does not get up to move a lot and lunch is pushed back to 2pm on occasion.  She is not sure how to address this.  Her current job started in February and lacked training before start at home. Able to sleep well now.  She admits to feelings of palpitations at home right after coming home that woke her up at night.      Family Dynamics   Good Support System?  Yes   Mom, husband, son (in college)     Barriers   Psychosocial barriers to participate in program  The patient should benefit from training in stress management and relaxation.;Psychosocial barriers identified (see note)      Screening Interventions   Interventions  Encouraged to exercise;To provide support and resources with identified psychosocial needs;Provide feedback about the scores to participant    Expected Outcomes  Short Term goal: Utilizing psychosocial counselor, staff and physician to assist with identification of specific Stressors or current issues interfering with healing process. Setting desired goal for each stressor or current issue identified.;Long Term Goal: Stressors or current issues are controlled or eliminated.;Long Term goal: The participant improves quality of Life and PHQ9 Scores as seen by post scores and/or verbalization of changes;Short Term goal: Identification and  review with participant of any Quality of Life or Depression concerns found by scoring the questionnaire.       Quality of Life Scores:  Quality of Life - 08/09/19 1240      Quality of Life   Select  Quality of Life      Quality of Life Scores   Health/Function Pre  14.4 %    Socioeconomic Pre  15 %    Psych/Spiritual Pre  13.21 %    Family Pre  22.8 %    GLOBAL Pre  15.6 %      Scores of 19 and below usually indicate a poorer quality of life in these areas.   A difference of  2-3 points is a clinically meaningful difference.  A difference of 2-3 points in the total score of the Quality of Life Index has been associated with significant improvement in overall quality of life, self-image, physical symptoms, and general health in studies assessing change in quality of life.  PHQ-9: Recent Review Flowsheet Data    Depression screen [facility] 2/9 08/09/2019   Decreased Interest 0   Down, Depressed, Hopeless 1   PHQ - 2 Score 1   Altered sleeping 0   Tired, decreased energy 1   Change in appetite 0   Feeling bad or failure about yourself  0   Trouble concentrating 0   Moving slowly or fidgety/restless 0   Suicidal thoughts 0   PHQ-9 Score 2   Difficult doing work/chores Not difficult at all     Interpretation of Total Score  Total Score Depression Severity:  1-4 = Minimal depression, 5-9 = Mild depression, 10-14 = Moderate depression, 15-19 = Moderately severe depression, 20-27 = Severe depression   Psychosocial Evaluation and Intervention: Psychosocial Evaluation - 08/08/19 0947      Psychosocial Evaluation & Interventions   Interventions  Stress management education;Encouraged to exercise with the program and follow exercise prescription    Comments  Elner is coming into rehab after a NSTEMI and DES x2.  She has had a very stressful couple of years, especially this year.  She has a high stress job that she is considering changing.  She started her current job in [place] with limited training and was then left on her own at home.  She does not have time to get up to move much during the day either. She has been less active and doing more stress eating as well.  They originally moved down here for her husband's job.  But also the goal was to get away from the snow and see more seasons. She grew up coming down to [place] to visit and fish in the summers on her childhood and liked the area.  She has a great support system in her family.  Her mom is still  in [place] and her son is in college.  They are planning to fill their son in on her recent events once he comes home after exams.  She is looking forward to the program. Prior to the pandemic she had joined the gym at [employer] and was using the weight circuit and had lost 10 lbs.  Unfornutately, she has stopped moving and regained all the weight back.  She is eager to get back into an exericse routine. She is hoping to get back to losing weight and improve her cardio fitness.  She is also looking for a plan to conitnue to move forward on her own too.    Expected  Outcomes  Short: Attend rehab regularly for exercise.  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    Continue Psychosocial Services   Follow up required by staff       Psychosocial Re-Evaluation:   Psychosocial Discharge (Final Psychosocial Re-Evaluation):   Vocational Rehabilitation: Provide vocational rehab assistance to qualifying candidates.   Vocational Rehab Evaluation & Intervention: Vocational Rehab - 08/09/19 6387      Initial Vocational Rehab Evaluation & Intervention   Assessment shows need for Vocational Rehabilitation  Yes    Vocational Rehab Packet given to patient  08/09/19    Documents faxed to [facility] Dept of Vocational Rehabilitation  08/09/19       Education: Education Goals: Education classes will be provided on a variety of topics geared toward better understanding of heart health and risk factor modification. Participant will state understanding/return demonstration of topics presented as noted by education test scores.  Learning Barriers/Preferences: Learning Barriers/Preferences - 08/08/19 0934      Learning Barriers/Preferences   Learning Barriers  Sight   Glasses   Learning Preferences  None       Education Topics:  AED/CPR: - Group verbal and written instruction with the use of models to demonstrate the basic use of the AED with the basic ABC's of  resuscitation.   General Nutrition Guidelines/Fats and Fiber: -Group instruction provided by verbal, written material, models and posters to present the general guidelines for heart healthy nutrition. Gives an explanation and review of dietary fats and fiber.   Controlling Sodium/Reading Food Labels: -Group verbal and written material supporting the discussion of sodium use in heart healthy nutrition. Review and explanation with models, verbal and written materials for utilization of the food label.   Exercise Physiology & General Exercise Guidelines: - Group verbal and written instruction with models to review the exercise physiology of the cardiovascular system and associated critical values. Provides general exercise guidelines with specific guidelines to those with heart or lung disease.    Aerobic Exercise & Resistance Training: - Gives group verbal and written instruction on the various components of exercise. Focuses on aerobic and resistive training programs and the benefits of this training and how to safely progress through these programs..   Flexibility, Balance, Mind/Body Relaxation: Provides group verbal/written instruction on the benefits of flexibility and balance training, including mind/body exercise modes such as yoga, pilates and tai chi.  Demonstration and skill practice provided.   Stress and Anxiety: - Provides group verbal and written instruction about the health risks of elevated stress and causes of high stress.  Discuss the correlation between heart/lung disease and anxiety and treatment options. Review healthy ways to manage with stress and anxiety.   Depression: - Provides group verbal and written instruction on the correlation between heart/lung disease and depressed mood, treatment options, and the stigmas associated with seeking treatment.   Anatomy & Physiology of the Heart: - Group verbal and written instruction and models provide basic cardiac anatomy  and physiology, with the coronary electrical and arterial systems. Review of Valvular disease and Heart Failure   Cardiac Procedures: - Group verbal and written instruction to review commonly prescribed medications for heart disease. Reviews the medication, class of the drug, and side effects. Includes the steps to properly store meds and maintain the prescription regimen. (beta blockers and nitrates)   Cardiac Medications I: - Group verbal and written instruction to review commonly prescribed medications for heart disease. Reviews the medication, class of the drug, and side effects. Includes the  steps to properly store meds and maintain the prescription regimen.   Cardiac Medications II: -Group verbal and written instruction to review commonly prescribed medications for heart disease. Reviews the medication, class of the drug, and side effects. (all other drug classes)    Go Sex-Intimacy & Heart Disease, Get SMART - Goal Setting: - Group verbal and written instruction through game format to discuss heart disease and the return to sexual intimacy. Provides group verbal and written material to discuss and apply goal setting through the application of the S.M.A.R.T. Method.   Other Matters of the Heart: - Provides group verbal, written materials and models to describe Stable Angina and Peripheral Artery. Includes description of the disease process and treatment options available to the cardiac patient.   Exercise & Equipment Safety: - Individual verbal instruction and demonstration of equipment use and safety with use of the equipment.   Cardiac Rehab from 08/09/2019 in [facility] Cardiac and Pulmonary Rehab  Date  08/09/19  Educator  [facility]  Instruction Review Code  1- Verbalizes Understanding      Infection Prevention: - Provides verbal and written material to individual with discussion of infection control including proper hand washing and proper equipment cleaning during exercise session.    Cardiac Rehab from 08/09/2019 in [facility] Cardiac and Pulmonary Rehab  Date  08/09/19  Educator  [facility]  Instruction Review Code  1- Verbalizes Understanding      Falls Prevention: - Provides verbal and written material to individual with discussion of falls prevention and safety.   Cardiac Rehab from 08/09/2019 in [facility] Cardiac and Pulmonary Rehab  Date  08/09/19  Educator  [facility]  Instruction Review Code  1- Verbalizes Understanding      Diabetes: - Individual verbal and written instruction to review signs/symptoms of diabetes, desired ranges of glucose level fasting, after meals and with exercise. Acknowledge that pre and post exercise glucose checks will be done for 3 sessions at entry of program.   Know Your Numbers and Risk Factors: -Group verbal and written instruction about important numbers in your health.  Discussion of what are risk factors and how they play a role in the disease process.  Review of Cholesterol, Blood Pressure, Diabetes, and BMI and the role they play in your overall health.   Sleep Hygiene: -Provides group verbal and written instruction about how sleep can affect your health.  Define sleep hygiene, discuss sleep cycles and impact of sleep habits. Review good sleep hygiene tips.    Other: -Provides group and verbal instruction on various topics (see comments)   Knowledge Questionnaire Score:   Core Components/Risk Factors/Patient Goals at Admission: Personal Goals and Risk Factors at Admission - 08/09/19 1241      Core Components/Risk Factors/Patient Goals on Admission    Weight Management  Yes;Obesity;Weight Loss    Intervention  Weight Management: Develop a combined nutrition and exercise program designed to reach desired caloric intake, while maintaining appropriate intake of nutrient and fiber, sodium and fats, and appropriate energy expenditure required for the weight goal.;Weight Management: Provide education and appropriate resources to help participant  work on and attain dietary goals.;Weight Management/Obesity: Establish reasonable short term and long term weight goals.;Obesity: Provide education and appropriate resources to help participant work on and attain dietary goals.    Admit Weight  302 lb 14.4 oz (137.4 kg)    Goal Weight: Short Term  295 lb (133.8 kg)    Goal Weight: Long Term  290 lb (131.5 kg)    Expected Outcomes  Short Term: Continue to assess and modify interventions until short term weight is achieved;Long Term: Adherence to nutrition and physical activity/exercise program aimed toward attainment of established weight goal;Weight Loss: Understanding of general recommendations for a balanced deficit meal plan, which promotes 1-2 lb weight loss per week and includes a negative energy balance of [phone] kcal/d;Understanding recommendations for meals to include 15-35% energy as protein, 25-35% energy from fat, 35-60% energy from carbohydrates, less than 251m of dietary cholesterol, 20-35 gm of total fiber daily;Understanding of distribution of calorie intake throughout the day with the consumption of 4-5 meals/snacks    Hypertension  Yes    Intervention  Provide education on lifestyle modifcations including regular physical activity/exercise, weight management, moderate sodium restriction and increased consumption of fresh fruit, vegetables, and low fat dairy, alcohol moderation, and smoking cessation.;Monitor prescription use compliance.    Expected Outcomes  Short Term: Continued assessment and intervention until BP is < 140/963mHG in hypertensive participants. < 130/8073mG in hypertensive participants with diabetes, heart failure or chronic kidney disease.;Long Term: Maintenance of blood pressure at goal levels.    Lipids  Yes    Intervention  Provide education and support for participant on nutrition & aerobic/resistive exercise along with prescribed medications to achieve LDL <51m1mDL >40mg72m Expected Outcomes  Short Term:  Participant states understanding of desired cholesterol values and is compliant with medications prescribed. Participant is following exercise prescription and nutrition guidelines.;Long Term: Cholesterol controlled with medications as prescribed, with individualized exercise RX and with personalized nutrition plan. Value goals: LDL < 51mg,31m > 40 mg.       Core Components/Risk Factors/Patient Goals Review:    Core Components/Risk Factors/Patient Goals at Discharge (Final Review):    ITP Comments: ITP Comments    Row Name 08/08/19 1010 08/09/19 1232 08/10/19 1012 08/13/19 1201 08/22/19 0932   ITP Comments  Completed virtuatl orientation today.  [facility]  EP eval scheduled for 12/17 at 93.  Pt has f/u with Dr. End toSa[facility] Revel!  Completed 6MWT and gym orientation.  Initial ITP created and sent for review to Dr. Mark MEmily Filbertcal Director.  First full day of exercise!  Patient was oriented to gym and equipment including functions, settings, policies, and procedures.  Patient's individual exercise prescription and treatment plan were reviewed.  All starting workloads were established based on the results of the 6 minute walk test done at initial orientation visit.  The plan for exercise progression was also introduced and progression will be customized based on patient's performance and goals.  Completed Initial RD Eval  30 day review competed . ITP sent to Dr Mark MEmily [place], changes as needed and ITP approval signature  New to program      Comments:

## 2019-08-28 ENCOUNTER — Other Ambulatory Visit: Payer: Self-pay

## 2019-08-28 ENCOUNTER — Telehealth: Payer: Self-pay | Admitting: Internal Medicine

## 2019-08-28 MED ORDER — TICAGRELOR 90 MG PO TABS: 90.0000 mg | ORAL_TABLET | Freq: Two times a day (BID) | ORAL | 2 refills | Status: DC

## 2019-08-28 NOTE — Telephone Encounter (Signed)
ticagrelor (BRILINTA) 90 MG TABS tablet 180 tablet 2 08/28/2019    Sig - Route: Take 1 tablet (90 mg total) by mouth 2 (two) times daily. - Oral   Sent to pharmacy as: ticagrelor (BRILINTA) 90 MG Tab tablet   E-Prescribing Status: Sent to pharmacy (08/28/2019  4:02 PM EST)   Pharmacy  [id number] - [place], [place] - [address]

## 2019-08-28 NOTE — Telephone Encounter (Signed)
*  STAT* If patient is at the pharmacy, call can be transferred to refill team.   1. Which medications need to be refilled? (please list name of each medication and dose if known) Brilinta 90 MG   2. Which pharmacy/location (including street and city if local pharmacy) is medication to be sent to? [facility] on [address] and [address]   3. Do they need a 30 day or 90 day supply? 90 day   Receipt states it was sent in on 12/16 but patient states pharmacy did not have when she picked up other medications.  Please review and resend.

## 2019-08-29 ENCOUNTER — Encounter: Payer: Self-pay | Admitting: *Deleted

## 2019-08-29 ENCOUNTER — Telehealth: Payer: Self-pay | Admitting: *Deleted

## 2019-08-29 ENCOUNTER — Ambulatory Visit: Payer: 59 | Admitting: Internal Medicine

## 2019-08-29 DIAGNOSIS — I214 Non-ST elevation (NSTEMI) myocardial infarction: Secondary | ICD-10-CM

## 2019-08-29 DIAGNOSIS — Z955 Presence of coronary angioplasty implant and graft: Secondary | ICD-10-CM

## 2019-08-29 NOTE — Telephone Encounter (Signed)
Tonya [name] called the office and spoke with Diane today.  She was concerned about covering the cost of her deductible and out of pocket expenses again.  Diane spoke to her about our virtual program some and asked for me to reach out.  We cancelled her appointment for tomorrow.  I called and left her message, also sent her an email.  I spoke with her earlier this week about doing resistance training at the gym on her own.  I encouraged her to call back for [place] to discuss the options that we have available.

## 2019-08-30 ENCOUNTER — Ambulatory Visit: Payer: 59

## 2019-09-03 ENCOUNTER — Ambulatory Visit: Payer: 59

## 2019-09-05 ENCOUNTER — Other Ambulatory Visit: Payer: Self-pay

## 2019-09-05 ENCOUNTER — Encounter: Payer: 59 | Attending: Internal Medicine | Admitting: *Deleted

## 2019-09-05 DIAGNOSIS — I2510 Atherosclerotic heart disease of native coronary artery without angina pectoris: Secondary | ICD-10-CM | POA: Insufficient documentation

## 2019-09-05 DIAGNOSIS — Z79890 Hormone replacement therapy (postmenopausal): Secondary | ICD-10-CM | POA: Insufficient documentation

## 2019-09-05 DIAGNOSIS — Z955 Presence of coronary angioplasty implant and graft: Secondary | ICD-10-CM | POA: Insufficient documentation

## 2019-09-05 DIAGNOSIS — Z79899 Other long term (current) drug therapy: Secondary | ICD-10-CM | POA: Insufficient documentation

## 2019-09-05 DIAGNOSIS — I214 Non-ST elevation (NSTEMI) myocardial infarction: Secondary | ICD-10-CM | POA: Insufficient documentation

## 2019-09-05 DIAGNOSIS — E039 Hypothyroidism, unspecified: Secondary | ICD-10-CM | POA: Insufficient documentation

## 2019-09-05 DIAGNOSIS — Z7902 Long term (current) use of antithrombotics/antiplatelets: Secondary | ICD-10-CM | POA: Insufficient documentation

## 2019-09-05 DIAGNOSIS — Z7982 Long term (current) use of aspirin: Secondary | ICD-10-CM | POA: Insufficient documentation

## 2019-09-05 DIAGNOSIS — E079 Disorder of thyroid, unspecified: Secondary | ICD-10-CM | POA: Insufficient documentation

## 2019-09-05 NOTE — Progress Notes (Signed)
Completed virtual follow up with Tonya [name] today.  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    She is doing well.  She is going to [employer] for exercise.  She is off to a good start on her own.  She has been using the treadmill and elliptical and weight machines.  She really wants to use exercise as stress relief and for weight loss.  Tonya [name] was able to lose weight over the holidays!!  Her pressures have been good as well.  She will be using the [employer] app to track her progress for weight, pressures and exercise.  Tonya [name] has been doing better with her diet.  She even lost weight over the holidays.  She is averaging two meals a day still, but wants to try out a protein drink for a meal replacement in the morning.   Next follow is scheduled for 1/27 at 130pm and we will get her set up to check in with [facility] as well.

## 2019-09-10 ENCOUNTER — Ambulatory Visit: Payer: 59 | Admitting: Physician Assistant

## 2019-09-13 ENCOUNTER — Ambulatory Visit: Payer: 59

## 2019-09-14 NOTE — Progress Notes (Deleted)
Cardiology Office Note    Date:  09/14/2019   ID:  [name], DOB [birth year], MRN [id number]  PCP:  [facility]  Cardiologist:  Yvonne Kendall, MD  Electrophysiologist:  None   Chief Complaint: Follow-up  History of Present Illness:   Tonya [name] is a 53 y.o. female with history of CAD with NSTEMI in 07/2019 status post PCI as detailed below, systolic dysfunction secondary to ICM, DM2, HTN, HLD, obesity, and hypothyroidism who presents for follow-up of her CAD.  Patient was admitted to the hospital in 07/2019 with a non-STEMI and evaluated during her hospital stay by Dr. Gwen Pounds.  Echo showed an EF of 45 to 50%, inferior regional wall motion normalities, normal RV systolic function and cavity size, mild mitral vegetation, trivial tricuspid regurgitation.  She was taken to the Cath Lab by [facility] cardiology which revealed two-vessel CAD including occlusion of the mid RCA and 90% mid LCx stenosis.  She underwent PCI/DES of both lesions by Dr. Okey Dupre.  Following this, she wished to transfer her care to our practice.  She was seen in hospital follow-up on 08/08/2019 and noted waking up with tachypalpitations and dyspnea for the first couple of nights after leaving the hospital with spontaneous resolution and without recurrence.  She did note some dyspnea associated with ticagrelor though this had resolved.  Lastly, she did note some abdominal discomfort and diarrhea after her discharge and was concerned this may have been related to her medications and subsequently discontinued atorvastatin and lisinopril with resolution of symptoms.  She was restarted on low-dose lisinopril.  Initiation of beta-blockade was deferred given her resting heart rate in the 60s bpm.  She was also restarted on lower dose atorvastatin 40 mg daily.  She has been participating in cardiac rehab.  ***   Labs independently reviewed: 07/2019 - Hgb 13.4, PLT 241, potassium 3.6, BUN 9, serum creatinine 0.67,  albumin 3.6, AST/ALT normal 06/2019 - TC 197, TG 112, HDL 53, LDL 122, TSH 0.043, A1c 7.7  Past Medical History:  Diagnosis Date  . Coronary artery disease   . Hypothyroidism   . Thyroid disease     Past Surgical History:  Procedure Laterality Date  . CARDIAC CATHETERIZATION    . CESAREAN SECTION    . CORONARY STENT INTERVENTION N/A 07/24/2019   Procedure: CORONARY STENT INTERVENTION;  Surgeon: Yvonne Kendall, MD;  Location: [facility] INVASIVE CV LAB;  Service: Cardiovascular;  Laterality: N/A;  RCA & CFX  . LEFT HEART CATH AND CORONARY ANGIOGRAPHY N/A 07/24/2019   Procedure: LEFT HEART CATH AND CORONARY ANGIOGRAPHY;  Surgeon: Lamar Blinks, MD;  Location: [facility] INVASIVE CV LAB;  Service: Cardiovascular;  Laterality: N/A;  . TONSILLECTOMY      Current Medications: No outpatient medications have been marked as taking for the 09/20/19 encounter (Appointment) with Sondra Barges, PA-C.    Allergies:   Patient has no known allergies.   Social History   Socioeconomic History  . Marital status: Married    Spouse name: Not on file  . Number of children: Not on file  . Years of education: Not on file  . Highest education level: Not on file  Occupational History  . Not on file  Tobacco Use  . Smoking status: Never Smoker  . Smokeless tobacco: Never Used  Substance and Sexual Activity  . Alcohol use: Not Currently    Comment: occ  . Drug use: Never  . Sexual activity: Not on file  Other Topics Concern  .  Not on file  Social History Narrative  . Not on file   Social Determinants of Health   Financial Resource Strain: Low Risk   . Difficulty of Paying Living Expenses: Not hard at all  Food Insecurity: Unknown  . Worried About Charity fundraiser in the Last Year: Never true  . Ran Out of Food in the Last Year: Not on file  Transportation Needs: No Transportation Needs  . Lack of Transportation (Medical): No  . Lack of Transportation (Non-Medical): No  Physical Activity:  Unknown  . Days of Exercise per Week: 2 days  . Minutes of Exercise per Session: Not on file  Stress: No Stress Concern Present  . Feeling of Stress : Not at all  Social Connections:   . Frequency of Communication with Friends and Family: Not on file  . Frequency of Social Gatherings with Friends and Family: Not on file  . Attends Religious Services: Not on file  . Active Member of Clubs or Organizations: Not on file  . Attends Archivist Meetings: Not on file  . Marital Status: Not on file     Family History:  The patient's family history includes Heart Problems in her father; Heart attack in her father; Heart disease in her father.  ROS:   ROS   EKGs/Labs/Other Studies Reviewed:    Studies reviewed were summarized above. The additional studies were reviewed today:  2D echo 07/2019:  1. Left ventricular ejection fraction, by visual estimation, is 45 to 50%. The left ventricle has mildly decreased function. There is no left ventricular hypertrophy.  2. Definity contrast agent was given IV to delineate the left ventricular endocardial borders.  3. Small inferior myocardial infarction.  4. The left ventricle demonstrates regional wall motion abnormalities.  5. Global right ventricle has normal systolic function.The right ventricular size is normal. No increase in right ventricular wall thickness.  6. Left atrial size was normal.  7. Right atrial size was normal.  8. The mitral valve is normal in structure. Mild mitral valve regurgitation.  9. The tricuspid valve is normal in structure. Tricuspid valve regurgitation is trivial. 10. The aortic valve is normal in structure. Aortic valve regurgitation is not visualized. 11. The pulmonic valve was grossly normal. Pulmonic valve regurgitation is not visualized. __________  LHC 07/2019:  Prox RCA lesion is 100% stenosed.  Prox Cx lesion is 90% stenosed.  Mid Cx lesion is 35% stenosed.  Mid LAD lesion is 40% stenosed.    54 year old female with hypertension hyperlipidemia diabetes and obesity with acute non-ST elevation myocardial infarction  LV with inferior lateral hypokinesis with ejection fraction of 50%  Occlusion of the mid right coronary artery with collaterals to the PDA from the left Significant 90% stenosis of proximal left circumflex artery Moderate atherosclerosis of left anterior descending artery  Plan Attempt PCI and stent placement of proximal to mid right coronary artery if culprit and not appearing to be chronic total occlusion PCI and stent placement proximal left circumflex artery due to significant stenoses and non-ST elevation myocardial infarction Dual antiplatelet therapy Beta-blocker and ACE inhibitor for myocardial infarction and hypertension control Diabetes controlled with current medical regimen and insulin no with a goal hemoglobin A1c below 7 Begin cardiac rehabilitation __________  PCI 07/2019: Conclusions: 1. Severe two-vessel Neri artery disease. See Dr. Alveria Apley diagnostic catheterization report for details. 2. Successful PCI to mid RCA with overlapping Resolute Onyx 3.0 x 12 mm and 2.75 x 38 mm drug eluting stents. 3. Successful  PCI to mid/distal LCx with Resolute Onyx 3.0 x 26 mm drug eluting stents.  Recommendations: 1. DAPT with ASA and ticagrelor for at least 12 months. 2. Gentle diuresis, given significantly elevated LVEDP. 3. Aggressive secondary prevention.   EKG:  EKG is ordered today.  The EKG ordered today demonstrates ***  Recent Labs: 07/23/2019: TSH 0.043 07/24/2019: ALT 22 07/25/2019: BUN 9; Creatinine, Ser 0.67; Hemoglobin 13.4; Platelets 241; Potassium 3.6; Sodium 137  Recent Lipid Panel    Component Value Date/Time   CHOL 197 07/23/2019 0857   TRIG 112 07/23/2019 0857   HDL 53 07/23/2019 0857   CHOLHDL 3.7 07/23/2019 0857   VLDL 22 07/23/2019 0857   LDLCALC 122 (H) 07/23/2019 0857    PHYSICAL EXAM:    VS:  LMP 07/22/2016    BMI: There is no height or weight on file to calculate BMI.  Physical Exam  Wt Readings from Last 3 Encounters:  08/09/19 (!) 302 lb 14.4 oz (137.4 kg)  08/08/19 (!) 301 lb 4 oz (136.6 kg)  07/25/19 (!) 310 lb 14.4 oz (141 kg)     ASSESSMENT & PLAN:   1. ***  Disposition: F/u with Dr. Okey Dupre or an APP in ***.   Medication Adjustments/Labs and Tests Ordered: Current medicines are reviewed at length with the patient today.  Concerns regarding medicines are outlined above. Medication changes, Labs and Tests ordered today are summarized above and listed in the Patient Instructions accessible in Encounters.   Signed, Eula Listen, PA-C 09/14/2019 2:50 PM     [facility] [address] [place], [place] [address] [phone]

## 2019-09-17 ENCOUNTER — Ambulatory Visit: Payer: 59

## 2019-09-17 ENCOUNTER — Telehealth: Payer: Self-pay

## 2019-09-17 NOTE — Telephone Encounter (Signed)
Left message. Called to schedule virtual RD f/u visit.

## 2019-09-19 ENCOUNTER — Encounter: Payer: 59 | Admitting: *Deleted

## 2019-09-19 ENCOUNTER — Telehealth: Payer: Self-pay | Admitting: *Deleted

## 2019-09-19 ENCOUNTER — Other Ambulatory Visit: Payer: Self-pay

## 2019-09-19 DIAGNOSIS — I214 Non-ST elevation (NSTEMI) myocardial infarction: Secondary | ICD-10-CM

## 2019-09-19 DIAGNOSIS — Z955 Presence of coronary angioplasty implant and graft: Secondary | ICD-10-CM

## 2019-09-19 NOTE — Progress Notes (Signed)
Called for virtual follow up today.  Pt was tied up with work and unable to talk currently.  She was encouraged to call us back when she can.  

## 2019-09-19 NOTE — Telephone Encounter (Signed)
Called for virtual follow up today.  Pt was tied up with work and unable to talk currently.  She was encouraged to call us back when she can.

## 2019-09-20 ENCOUNTER — Ambulatory Visit: Payer: 59 | Admitting: Physician Assistant

## 2019-09-27 ENCOUNTER — Ambulatory Visit: Payer: 59

## 2019-10-11 ENCOUNTER — Ambulatory Visit: Payer: 59

## 2019-10-25 ENCOUNTER — Ambulatory Visit: Payer: 59

## 2019-10-26 ENCOUNTER — Telehealth: Payer: Self-pay | Admitting: Internal Medicine

## 2019-10-26 NOTE — Telephone Encounter (Signed)
-----   Message from Stann Mainland, RN sent at 10/25/2019  5:00 PM EST ----- Regarding: overdue for .appt Hi ladies,  Came across this patient who is overdue for 1 month follow up with Dr End or APP. Please reach out to patient to schedule when able.  Thanks, Britt Boozer, Charity fundraiser

## 2019-10-26 NOTE — Telephone Encounter (Signed)
LVM for patient to call back and schedule

## 2019-10-29 NOTE — Telephone Encounter (Signed)
Patient declined to schedule at this time.  She states she is doing well losing weight bp is good with meds and her insurance does not cover visit until she has reached High oop max of 2800.  Patient will call when ready.

## 2019-10-29 NOTE — Telephone Encounter (Signed)
To Dr. End as an FYI.  

## 2019-11-01 ENCOUNTER — Telehealth: Payer: Self-pay | Admitting: *Deleted

## 2019-11-01 NOTE — Telephone Encounter (Signed)
No answer. Left message to call back.   

## 2019-11-01 NOTE — Telephone Encounter (Signed)
-----   Message from Yvonne Kendall, MD sent at 10/31/2019  8:38 AM EST ----- Outside labs were drawn prior to PCI and are of limited utility in ensuring appropriate response to medical therapy.  I recommend repeat lipid panel and ALT at the patient's convenience.  She has previously declined follow-up visit due to out-of-pocket cost.  I understand the financial burdens of her healthcare, though given history of CAD with multivessel PCI, it is important that we monitor her medical therapy.  She should have the labs drawn through [place] or her PCP at her earliest convenience and follow-up in the office when possible.

## 2019-11-02 ENCOUNTER — Encounter: Payer: Self-pay | Admitting: *Deleted

## 2019-11-02 NOTE — Telephone Encounter (Signed)
Letter with results mailed to patient.

## 2019-11-14 ENCOUNTER — Encounter: Payer: Self-pay | Admitting: *Deleted

## 2019-11-14 DIAGNOSIS — I214 Non-ST elevation (NSTEMI) myocardial infarction: Secondary | ICD-10-CM

## 2019-11-14 DIAGNOSIS — Z955 Presence of coronary angioplasty implant and graft: Secondary | ICD-10-CM

## 2019-11-14 NOTE — Progress Notes (Signed)
Cardiac Individual Treatment Plan  Patient Details  Name: Tonya [name] MRN: [id number] Date of Birth: 08/23/1965 Referring Provider:     Cardiac Rehab from 08/09/2019 in [facility]  Referring Provider  End, Christopher MD      Initial Encounter Date:    Cardiac Rehab from 08/09/2019 in [facility]  Date  08/09/19      Visit Diagnosis: NSTEMI (non-ST elevated myocardial infarction) (HCC)  Status post coronary artery stent placement  Patient's Home Medications on Admission:  Current Outpatient Medications:  .  aspirin EC 81 MG tablet, Take 1 tablet (81 mg total) by mouth daily., Disp: 150 tablet, Rfl: 0 .  atorvastatin (LIPITOR) 40 MG tablet, Take 1 tablet (40 mg total) by mouth daily at 6 PM., Disp: 90 tablet, Rfl: 3 .  lisinopril (ZESTRIL) 5 MG tablet, Take 1 tablet (5 mg total) by mouth daily., Disp: 90 tablet, Rfl: 3 .  nitroGLYCERIN (NITROSTAT) 0.4 MG SL tablet, Place 1 tablet (0.4 mg total) under the tongue every 5 (five) minutes as needed for chest pain., Disp: 25 tablet, Rfl: 3 .  thyroid (ARMOUR) 120 MG tablet, Take 120 mg by mouth daily before breakfast., Disp: , Rfl:  .  ticagrelor (BRILINTA) 90 MG TABS tablet, Take 1 tablet (90 mg total) by mouth 2 (two) times daily., Disp: 180 tablet, Rfl: 2  Past Medical History: Past Medical History:  Diagnosis Date  . Coronary artery disease   . Hypothyroidism   . Thyroid disease     Tobacco Use: Social History   Tobacco Use  Smoking Status Never Smoker  Smokeless Tobacco Never Used    Labs: Recent Review Flowsheet Data    Labs for ITP Cardiac and Pulmonary Rehab Latest Ref Rng & Units 07/23/2019   Cholestrol 0 - 200 mg/dL 197   LDLCALC 0 - 99 mg/dL 122(H)   HDL >40 mg/dL 53   Trlycerides <150 mg/dL 112   Hemoglobin A1c 4.8 - 5.6 % 7.7(H)       Exercise Target Goals: Exercise Program Goal: Individual exercise prescription set using results from initial 6 min walk test  and THRR while considering  patient's activity barriers and safety.   Exercise Prescription Goal: Initial exercise prescription builds to 30-45 minutes a day of aerobic activity, 2-3 days per week.  Home exercise guidelines will be given to patient during program as part of exercise prescription that the participant will acknowledge.  Activity Barriers & Risk Stratification: Activity Barriers & Cardiac Risk Stratification - 08/09/19 1235      Activity Barriers & Cardiac Risk Stratification   Activity Barriers  Deconditioning;Muscular Weakness;Decreased Ventricular Function;Joint Problems   occasional knee pain   Cardiac Risk Stratification  Moderate       6 Minute Walk: 6 Minute Walk    Row Name 08/09/19 1233         6 Minute Walk   Phase  Initial     Distance  1230 feet     Walk Time  6 minutes     # of Rest Breaks  0     MPH  2.33     METS  2.46     RPE  11     VO2 Peak  8.59     Symptoms  Yes (comment)     Comments  hip pain 3/10 (irritating feeling)     Resting HR  65 bpm     Resting BP  124/64       Resting Oxygen Saturation   96 %     Exercise Oxygen Saturation  during 6 min walk  97 %     Max Ex. HR  113 bpm     Max Ex. BP  132/74     2 Minute Post BP  126/70        Oxygen Initial Assessment:   Oxygen Re-Evaluation:   Oxygen Discharge (Final Oxygen Re-Evaluation):   Initial Exercise Prescription: Initial Exercise Prescription - 08/09/19 1200      Date of Initial Exercise RX and Referring Provider   Date  08/09/19    Referring Provider  End, Christopher MD      Treadmill   MPH  1.7    Grade  0.5    Minutes  15    METs  2.42      Recumbant Bike   Level  1    Watts  20    Minutes  15    METs  2.4      NuStep   Level  1    SPM  80    Minutes  15    METs  2      REL-XR   Level  1    Speed  20    Minutes  15    METs  2      T5 Nustep   Level  1    SPM  80    Minutes  15    METs  2      Prescription Details   Frequency (times per  week)  2    Duration  Progress to 30 minutes of continuous aerobic without signs/symptoms of physical distress      Intensity   THRR 40-80% of Max Heartrate  105-146    Ratings of Perceived Exertion  11-13    Perceived Dyspnea  0-4      Progression   Progression  Continue to progress workloads to maintain intensity without signs/symptoms of physical distress.      Resistance Training   Training Prescription  Yes    Weight  3 lb    Reps  10-15       Perform Capillary Blood Glucose checks as needed.  Exercise Prescription Changes: Exercise Prescription Changes    Row Name 08/09/19 1200 08/13/19 1000 08/15/19 1300         Response to Exercise   Blood Pressure (Admit)  124/64  --  166/78     Blood Pressure (Exercise)  132/74  --  158/74     Blood Pressure (Exit)  126/70  --  140/70     Heart Rate (Admit)  65 bpm  --  96 bpm     Heart Rate (Exercise)  113 bpm  --  119 bpm     Heart Rate (Exit)  68 bpm  --  84 bpm     Oxygen Saturation (Admit)  96 %  --  --     Oxygen Saturation (Exercise)  97 %  --  --     Rating of Perceived Exertion (Exercise)  11  --  11     Symptoms  hip pain 3/10  --  none     Comments  walk test results  --  third full day of exercise     Duration  --  --  Continue with 30 min of aerobic exercise without signs/symptoms of physical distress.     Intensity  --  --  THRR unchanged         Progression   Progression  --  --  Continue to progress workloads to maintain intensity without signs/symptoms of physical distress.     Average METs  --  --  2.17       Resistance Training   Training Prescription  --  Yes  Yes     Weight  --  3 lb  3 lb     Reps  --  10-15  10-15       Interval Training   Interval Training  --  --  No       Treadmill   MPH  --  1.7  1.7     Grade  --  0.5  0.5     Minutes  --  15  15     METs  --  2.42  2.42       Recumbant Bike   Level  --  1  1     Watts  --  20  22     Minutes  --  15  15     METs  --  2.4  2.47        NuStep   Level  --  1  --     SPM  --  80  --     Minutes  --  15  --     METs  --  2  --       REL-XR   Level  --  1  1     Speed  --  20  --     Minutes  --  15  15     METs  --  2  1.8       T5 Nustep   Level  --  1  1     SPM  --  80  --     Minutes  --  15  15     METs  --  2  2       Home Exercise Plan   Plans to continue exercise at  --  Home (comment) walking and videos  Home (comment) walking and videos     Frequency  --  Add 3 additional days to program exercise sessions.  Add 3 additional days to program exercise sessions.     Initial Home Exercises Provided  --  08/13/19  08/13/19        Exercise Comments:   Exercise Goals and Review: Exercise Goals    Row Name 08/09/19 1239             Exercise Goals   Increase Physical Activity  Yes       Intervention  Provide advice, education, support and counseling about physical activity/exercise needs.;Develop an individualized exercise prescription for aerobic and resistive training based on initial evaluation findings, risk stratification, comorbidities and participant's personal goals.       Expected Outcomes  Short Term: Attend rehab on a regular basis to increase amount of physical activity.;Long Term: Add in home exercise to make exercise part of routine and to increase amount of physical activity.;Long Term: Exercising regularly at least 3-5 days a week.       Increase Strength and Stamina  Yes       Intervention  Provide advice, education, support and counseling about physical activity/exercise needs.;Develop an individualized exercise prescription for aerobic and resistive training based on initial evaluation findings, risk stratification, comorbidities and participant's personal goals.  Expected Outcomes  Short Term: Increase workloads from initial exercise prescription for resistance, speed, and METs.;Short Term: Perform resistance training exercises routinely during rehab and add in resistance training at  home;Long Term: Improve cardiorespiratory fitness, muscular endurance and strength as measured by increased METs and functional capacity (6MWT)       Able to understand and use rate of perceived exertion (RPE) scale  Yes       Intervention  Provide education and explanation on how to use RPE scale       Expected Outcomes  Short Term: Able to use RPE daily in rehab to express subjective intensity level;Long Term:  Able to use RPE to guide intensity level when exercising independently       Able to understand and use Dyspnea scale  Yes       Intervention  Provide education and explanation on how to use Dyspnea scale       Expected Outcomes  Short Term: Able to use Dyspnea scale daily in rehab to express subjective sense of shortness of breath during exertion;Long Term: Able to use Dyspnea scale to guide intensity level when exercising independently       Knowledge and understanding of Target Heart Rate Range (THRR)  Yes       Intervention  Provide education and explanation of THRR including how the numbers were predicted and where they are located for reference       Expected Outcomes  Short Term: Able to state/look up THRR;Short Term: Able to use daily as guideline for intensity in rehab;Long Term: Able to use THRR to govern intensity when exercising independently       Able to check pulse independently  Yes       Intervention  Provide education and demonstration on how to check pulse in carotid and radial arteries.;Review the importance of being able to check your own pulse for safety during independent exercise       Expected Outcomes  Short Term: Able to explain why pulse checking is important during independent exercise;Long Term: Able to check pulse independently and accurately       Understanding of Exercise Prescription  Yes       Intervention  Provide education, explanation, and written materials on patient's individual exercise prescription       Expected Outcomes  Short Term: Able to explain  program exercise prescription;Long Term: Able to explain home exercise prescription to exercise independently          Exercise Goals Re-Evaluation : Exercise Goals Re-Evaluation    [place] Name 08/10/19 1012 08/13/19 1022 09/05/19 1435         Exercise Goal Re-Evaluation   Exercise Goals Review  Increase Physical Activity;Able to understand and use rate of perceived exertion (RPE) scale;Knowledge and understanding of Target Heart Rate Range (THRR);Understanding of Exercise Prescription;Increase Strength and Stamina;Able to check pulse independently  Increase Physical Activity;Increase Strength and Stamina;Able to check pulse independently;Able to understand and use rate of perceived exertion (RPE) scale;Knowledge and understanding of Target Heart Rate Range (THRR);Understanding of Exercise Prescription  Increase Physical Activity;Increase Strength and Stamina;Understanding of Exercise Prescription     Comments  Reviewed RPE scale, THR and program prescription with pt today.  Pt voiced understanding and was given a copy of goals to take home.  Reviewed home exercise with pt today.  Pt plans to walk and do exercise videos for exercise.  Reviewed THR, pulse, RPE, sign and symptoms, NTG use, and when to call 911 or MD.  Also discussed weather considerations and indoor options.  Pt voiced understanding.  She is doing well.  She is going to [employer] for exercise.  She is off to a good start on her own.  She has been using the treadmill and elliptical and weight machines.  She really wants to use exercise as stress relief and for weight loss.     Expected Outcomes  Short: Use RPE daily to regulate intensity. Long: Follow program prescription in THR.  Short: exercise 2-3 days at home. Long: become independent with exercise program  Short: Continue to go to gym regularly, check out YouTube site.  Long: Continue to rebuild strength and stamina.        Discharge Exercise Prescription (Final Exercise  Prescription Changes): Exercise Prescription Changes - 08/15/19 1300      Response to Exercise   Blood Pressure (Admit)  166/78    Blood Pressure (Exercise)  158/74    Blood Pressure (Exit)  140/70    Heart Rate (Admit)  96 bpm    Heart Rate (Exercise)  119 bpm    Heart Rate (Exit)  84 bpm    Rating of Perceived Exertion (Exercise)  11    Symptoms  none    Comments  third full day of exercise    Duration  Continue with 30 min of aerobic exercise without signs/symptoms of physical distress.    Intensity  THRR unchanged      Progression   Progression  Continue to progress workloads to maintain intensity without signs/symptoms of physical distress.    Average METs  2.17      Resistance Training   Training Prescription  Yes    Weight  3 lb    Reps  10-15      Interval Training   Interval Training  No      Treadmill   MPH  1.7    Grade  0.5    Minutes  15    METs  2.42      Recumbant Bike   Level  1    Watts  22    Minutes  15    METs  2.47      REL-XR   Level  1    Minutes  15    METs  1.8      T5 Nustep   Level  1    Minutes  15    METs  2      Home Exercise Plan   Plans to continue exercise at  Home (comment)   walking and videos   Frequency  Add 3 additional days to program exercise sessions.    Initial Home Exercises Provided  08/13/19       Nutrition:  Target Goals: Understanding of nutrition guidelines, daily intake of sodium <1500mg, cholesterol <200mg, calories 30% from fat and 7% or less from saturated fats, daily to have 5 or more servings of fruits and vegetables.  Biometrics: Pre Biometrics - 08/09/19 1239      Pre Biometrics   Height  5' 4.4" (1.636 m)    Weight  (!) 302 lb 14.4 oz (137.4 kg)    BMI (Calculated)  51.33    Single Leg Stand  30 seconds        Nutrition Therapy Plan and Nutrition Goals: Nutrition Therapy & Goals - 08/13/19 1201      Nutrition Therapy   Diet  Low Na, HH    Protein (specify units)  110g      Fiber  25  grams    Whole Grain Foods  3 servings    Saturated Fats  12 max. grams    Fruits and Vegetables  5 servings/day    Sodium  1.5 grams      Personal Nutrition Goals   Nutrition Goal  ST: adding snacks before lunch LT: Sustainable lifestyle changes    Comments  Pt reports on thyroid medicine since the birth of her son. Been ~300 lbs for about 10 years. Been pre-diabetic for years. 7.7 A1C. No breakfast, L: low CHO wrap, with no nitrate ham and [place] breast and swiss with lettuce and tomato with spicy mustard or mayo. D: shrimp, green beans, cauliflower rice or pork loin with red potato and carrots or sirloin steak with baked potato with broccoli. takes metamusal and drinks water during the day, used to drink 1 L of soda of day (now one glass). Discussed HH and DM eating, Importance of CHO and complex CHOs. Discussed ways to sneak in food during her work day while it's busy.      Intervention Plan   Intervention  Prescribe, educate and counsel regarding individualized specific dietary modifications aiming towards targeted core components such as weight, hypertension, lipid management, diabetes, heart failure and other comorbidities.;Nutrition handout(s) given to patient.    Expected Outcomes  Short Term Goal: Understand basic principles of dietary content, such as calories, fat, sodium, cholesterol and nutrients.;Short Term Goal: A plan has been developed with personal nutrition goals set during dietitian appointment.;Long Term Goal: Adherence to prescribed nutrition plan.       Nutrition Assessments: Nutrition Assessments - 08/09/19 1240      MEDFICTS Scores   Pre Score  30       Nutrition Goals Re-Evaluation: Nutrition Goals Re-Evaluation    Row Name 09/05/19 1437             Goals   Nutrition Goal  ST: adding snacks before lunch LT: Sustainable lifestyle changes       Comment  Tonya [name] has been doing better with her diet.  She even lost weight over the holidays.  She is averaging two  meals a day still, but wants to try out a protein drink for a meal replacement in the morning.       Expected Outcome  Short: Add in breakfast "meal"  Long: Focus on healthy eating          Nutrition Goals Discharge (Final Nutrition Goals Re-Evaluation): Nutrition Goals Re-Evaluation - 09/05/19 1437      Goals   Nutrition Goal  ST: adding snacks before lunch LT: Sustainable lifestyle changes    Comment  Tonya [name] has been doing better with her diet.  She even lost weight over the holidays.  She is averaging two meals a day still, but wants to try out a protein drink for a meal replacement in the morning.    Expected Outcome  Short: Add in breakfast "meal"  Long: Focus on healthy eating       Psychosocial: Target Goals: Acknowledge presence or absence of significant depression and/or stress, maximize coping skills, provide positive support system. Participant is able to verbalize types and ability to use techniques and skills needed for reducing stress and depression.   Initial Review & Psychosocial Screening: Initial Psych Review & Screening - 08/08/19 0916      Initial Review   Current issues with  Current Stress Concerns    Source of Stress Concerns  Occupation    Comments  High stress job especially prior to their recent move.  Out of last 5 years, 3-4 have been very stressful.  Moved down here two years ago. 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  Sedentary job (6 hours straight except trips to bathroom) and not able to take lunch.  Realizes that she does not get up to move a lot and lunch is pushed back to 2pm on occasion.  She is not sure how to address this.  Her current job started in February and lacked training before start at home. Able to sleep well now.  She admits to feelings of palpitations at home right after coming home that woke her up at night.      Family Dynamics   Good Support System?  Yes    Mom, husband, son (in college)     Barriers   Psychosocial barriers to participate in program  The patient should benefit from training in stress management and relaxation.;Psychosocial barriers identified (see note)      Screening Interventions   Interventions  Encouraged to exercise;To provide support and resources with identified psychosocial needs;Provide feedback about the scores to participant    Expected Outcomes  Short Term goal: Utilizing psychosocial counselor, staff and physician to assist with identification of specific Stressors or current issues interfering with healing process. Setting desired goal for each stressor or current issue identified.;Long Term Goal: Stressors or current issues are controlled or eliminated.;Long Term goal: The participant improves quality of Life and PHQ9 Scores as seen by post scores and/or verbalization of changes;Short Term goal: Identification and review with participant of any Quality of Life or Depression concerns found by scoring the questionnaire.       Quality of Life Scores:  Quality of Life - 08/09/19 1240      Quality of Life   Select  Quality of Life      Quality of Life Scores   Health/Function Pre  14.4 %    Socioeconomic Pre  15 %    Psych/Spiritual Pre  13.21 %    Family Pre  22.8 %    GLOBAL Pre  15.6 %      Scores of 19 and below usually indicate a poorer quality of life in these areas.  A difference of  2-3 points is a clinically meaningful difference.  A difference of 2-3 points in the total score of the Quality of Life Index has been associated with significant improvement in overall quality of life, self-image, physical symptoms, and general health in studies assessing change in quality of life.  PHQ-9: Recent Review Flowsheet Data    Depression screen [facility] 2/9 08/09/2019   Decreased Interest 0   Down, Depressed, Hopeless 1   PHQ - 2 Score 1   Altered sleeping 0   Tired, decreased energy 1   Change in appetite 0    Feeling bad or failure about yourself  0   Trouble concentrating 0   Moving slowly or fidgety/restless 0   Suicidal thoughts 0   PHQ-9 Score 2   Difficult doing work/chores Not difficult at all     Interpretation of Total Score  Total Score Depression Severity:  1-4 = Minimal depression, 5-9 = Mild depression, 10-14 = Moderate depression, 15-19 = Moderately severe depression, 20-27 = Severe depression   Psychosocial Evaluation and Intervention: Psychosocial Evaluation - 08/08/19 0947      Psychosocial Evaluation & Interventions   Interventions  Stress management education;Encouraged to exercise with the program and follow exercise prescription    Comments  Tonya [name] is coming into rehab after a NSTEMI and DES x2.  She has had a very stressful couple of years, especially this year.  She has a high stress job that she is considering changing.  She started her current job in [place] with limited training and was then left on her own at home.  She does not have time to get up to move much during the day either. She has been less active and doing more stress eating as well.  They originally moved down here for her husband's job.  But also the goal was to get away from the snow and see more seasons. She grew up coming down to [place] to visit and fish in the summers on her childhood and liked the area.  She has a great support system in her family.  Her mom is still in [place] and her son is in college.  They are planning to fill their son in on her recent events once he comes home after exams.  She is looking forward to the program. Prior to the pandemic she had joined the gym at [employer] and was using the weight circuit and had lost 10 lbs.  Unfornutately, she has stopped moving and regained all the weight back.  She is eager to get back into an exericse routine. She is hoping to get back to losing weight and improve her cardio fitness.  She is also looking for a plan to conitnue to  move forward on her own too.    Expected Outcomes  Short: Attend rehab regularly for exercise.  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    Continue Psychosocial Services   Follow up required by staff       Psychosocial Re-Evaluation:   Psychosocial Discharge (Final Psychosocial Re-Evaluation):   Vocational Rehabilitation: Provide vocational rehab assistance to qualifying candidates.   Vocational Rehab Evaluation & Intervention: Vocational Rehab - 08/09/19 6834      Initial Vocational Rehab Evaluation & Intervention   Assessment shows need for Vocational Rehabilitation  Yes    Vocational Rehab Packet given to patient  08/09/19    Documents faxed to [facility] Dept of Vocational Rehabilitation  08/09/19       Education: Education Goals: Education classes will be provided on a variety of topics geared toward better understanding of heart health and risk factor modification. Participant will state understanding/return demonstration of topics presented as noted by education test scores.  Learning Barriers/Preferences: Learning Barriers/Preferences - 08/08/19 0934      Learning Barriers/Preferences   Learning Barriers  Sight   Glasses   Learning Preferences  None       Education Topics:  AED/CPR: - Group verbal and written instruction with the use of models to demonstrate the basic use of the AED with the basic ABC's of resuscitation.   General Nutrition Guidelines/Fats and Fiber: -Group instruction provided by verbal, written material, models and posters to present the general guidelines for heart healthy nutrition. Gives an explanation and review of dietary fats and fiber.   Controlling Sodium/Reading Food Labels: -Group verbal and written material supporting the discussion of sodium use in heart healthy nutrition. Review and explanation with models, verbal and written materials for utilization of the food label.   Exercise Physiology & General Exercise Guidelines: -  Group verbal and written instruction with models to review the exercise physiology of the cardiovascular  system and associated critical values. Provides general exercise guidelines with specific guidelines to those with heart or lung disease.    Aerobic Exercise & Resistance Training: - Gives group verbal and written instruction on the various components of exercise. Focuses on aerobic and resistive training programs and the benefits of this training and how to safely progress through these programs..   Flexibility, Balance, Mind/Body Relaxation: Provides group verbal/written instruction on the benefits of flexibility and balance training, including mind/body exercise modes such as yoga, pilates and tai chi.  Demonstration and skill practice provided.   Stress and Anxiety: - Provides group verbal and written instruction about the health risks of elevated stress and causes of high stress.  Discuss the correlation between heart/lung disease and anxiety and treatment options. Review healthy ways to manage with stress and anxiety.   Depression: - Provides group verbal and written instruction on the correlation between heart/lung disease and depressed mood, treatment options, and the stigmas associated with seeking treatment.   Anatomy & Physiology of the Heart: - Group verbal and written instruction and models provide basic cardiac anatomy and physiology, with the coronary electrical and arterial systems. Review of Valvular disease and Heart Failure   Cardiac Procedures: - Group verbal and written instruction to review commonly prescribed medications for heart disease. Reviews the medication, class of the drug, and side effects. Includes the steps to properly store meds and maintain the prescription regimen. (beta blockers and nitrates)   Cardiac Medications I: - Group verbal and written instruction to review commonly prescribed medications for heart disease. Reviews the medication, class of  the drug, and side effects. Includes the steps to properly store meds and maintain the prescription regimen.   Cardiac Medications II: -Group verbal and written instruction to review commonly prescribed medications for heart disease. Reviews the medication, class of the drug, and side effects. (all other drug classes)    Go Sex-Intimacy & Heart Disease, Get SMART - Goal Setting: - Group verbal and written instruction through game format to discuss heart disease and the return to sexual intimacy. Provides group verbal and written material to discuss and apply goal setting through the application of the S.M.A.R.T. Method.   Other Matters of the Heart: - Provides group verbal, written materials and models to describe Stable Angina and Peripheral Artery. Includes description of the disease process and treatment options available to the cardiac patient.   Exercise & Equipment Safety: - Individual verbal instruction and demonstration of equipment use and safety with use of the equipment.   Cardiac Rehab from 08/09/2019 in [facility]  Date  08/09/19  Educator  JH  Instruction Review Code  1- Verbalizes Understanding      Infection Prevention: - Provides verbal and written material to individual with discussion of infection control including proper hand washing and proper equipment cleaning during exercise session.   Cardiac Rehab from 08/09/2019 in [facility]  Date  08/09/19  Educator  JH  Instruction Review Code  1- Verbalizes Understanding      Falls Prevention: - Provides verbal and written material to individual with discussion of falls prevention and safety.   Cardiac Rehab from 08/09/2019 in [facility]  Date  08/09/19  Educator  JH  Instruction Review Code  1- Verbalizes Understanding      Diabetes: - Individual verbal and written instruction to review signs/symptoms of diabetes, desired ranges of glucose  level fasting, after meals and with exercise. Acknowledge that   pre and post exercise glucose checks will be done for 3 sessions at entry of program.   Know Your Numbers and Risk Factors: -Group verbal and written instruction about important numbers in your health.  Discussion of what are risk factors and how they play a role in the disease process.  Review of Cholesterol, Blood Pressure, Diabetes, and BMI and the role they play in your overall health.   Sleep Hygiene: -Provides group verbal and written instruction about how sleep can affect your health.  Define sleep hygiene, discuss sleep cycles and impact of sleep habits. Review good sleep hygiene tips.    Other: -Provides group and verbal instruction on various topics (see comments)   Knowledge Questionnaire Score:   Core Components/Risk Factors/Patient Goals at Admission: Personal Goals and Risk Factors at Admission - 08/09/19 1241      Core Components/Risk Factors/Patient Goals on Admission    Weight Management  Yes;Obesity;Weight Loss    Intervention  Weight Management: Develop a combined nutrition and exercise program designed to reach desired caloric intake, while maintaining appropriate intake of nutrient and fiber, sodium and fats, and appropriate energy expenditure required for the weight goal.;Weight Management: Provide education and appropriate resources to help participant work on and attain dietary goals.;Weight Management/Obesity: Establish reasonable short term and long term weight goals.;Obesity: Provide education and appropriate resources to help participant work on and attain dietary goals.    Admit Weight  302 lb 14.4 oz (137.4 kg)    Goal Weight: Short Term  295 lb (133.8 kg)    Goal Weight: Long Term  290 lb (131.5 kg)    Expected Outcomes  Short Term: Continue to assess and modify interventions until short term weight is achieved;Long Term: Adherence to nutrition and physical activity/exercise program aimed toward  attainment of established weight goal;Weight Loss: Understanding of general recommendations for a balanced deficit meal plan, which promotes 1-2 lb weight loss per week and includes a negative energy balance of [phone] kcal/d;Understanding recommendations for meals to include 15-35% energy as protein, 25-35% energy from fat, 35-60% energy from carbohydrates, less than 259m of dietary cholesterol, 20-35 gm of total fiber daily;Understanding of distribution of calorie intake throughout the day with the consumption of 4-5 meals/snacks    Hypertension  Yes    Intervention  Provide education on lifestyle modifcations including regular physical activity/exercise, weight management, moderate sodium restriction and increased consumption of fresh fruit, vegetables, and low fat dairy, alcohol moderation, and smoking cessation.;Monitor prescription use compliance.    Expected Outcomes  Short Term: Continued assessment and intervention until BP is < 140/92mHG in hypertensive participants. < 130/8012mG in hypertensive participants with diabetes, heart failure or chronic kidney disease.;Long Term: Maintenance of blood pressure at goal levels.    Lipids  Yes    Intervention  Provide education and support for participant on nutrition & aerobic/resistive exercise along with prescribed medications to achieve LDL <53m59mDL >40mg13m Expected Outcomes  Short Term: Participant states understanding of desired cholesterol values and is compliant with medications prescribed. Participant is following exercise prescription and nutrition guidelines.;Long Term: Cholesterol controlled with medications as prescribed, with individualized exercise RX and with personalized nutrition plan. Value goals: LDL < 53mg,43m > 40 mg.       Core Components/Risk Factors/Patient Goals Review:  Goals and Risk Factor Review    Row Name 09/05/19 1438             Core Components/Risk Factors/Patient Goals Review   Personal Goals  Review   Weight Management/Obesity;Hypertension       Review  Tonya [name] was able to lose weight over the holidays!!  Her pressures have been good as well.  She will be using the [employer] app to track her progress for weight, pressures and exercise.       Expected Outcomes  Short: Use [employer] to track weight and pressures.  Long: Continue to work weight loss.          Core Components/Risk Factors/Patient Goals at Discharge (Final Review):  Goals and Risk Factor Review - 09/05/19 1438      Core Components/Risk Factors/Patient Goals Review   Personal Goals Review  Weight Management/Obesity;Hypertension    Review  Tonya [name] was able to lose weight over the holidays!!  Her pressures have been good as well.  She will be using the [employer] app to track her progress for weight, pressures and exercise.    Expected Outcomes  Short: Use [employer] to track weight and pressures.  Long: Continue to work weight loss.       ITP Comments: ITP Comments    Row Name 08/08/19 1010 08/09/19 1232 08/10/19 1012 08/13/19 1201 08/22/19 0932   ITP Comments  Completed virtuatl orientation today.  Documentation for diagnosis can be found in [facility] encounter 07/23/19.  EP eval scheduled for 12/17 at 93.  Pt has f/u with Dr. End today!  Completed 6MWT and gym orientation.  Initial ITP created and sent for review to Dr. Mark Miller, Medical Director.  First full day of exercise!  Patient was oriented to gym and equipment including functions, settings, policies, and procedures.  Patient's individual exercise prescription and treatment plan were reviewed.  All starting workloads were established based on the results of the 6 minute walk test done at initial orientation visit.  The plan for exercise progression was also introduced and progression will be customized based on patient's performance and goals.  Completed Initial RD Eval  30 day review competed . ITP sent to Dr Mark Miller for review, changes as needed and ITP approval  signature  New to program   Row Name 08/29/19 1416 08/30/19 1554 09/05/19 1434 09/19/19 1336 11/14/19 1125   ITP Comments  Tonya [name] called the office and spoke with Tonya [name] today.  She was concerned about covering the cost of her deductible and out of pocket expenses again.  Tonya [name] spoke to her about our virtual program some and asked for me to reach out.  We cancelled her appointment for tomorrow.  I called and left her message, also sent her an email.  I spoke with her earlier this week about doing resistance training at the gym on her own.  I encouraged her to call back for us to discuss the options that we have available.  Tonya [name] has not attended since last review.  Completed virtual follow up with Tonya [name] today.  Tonya [name] has chosen to switch to all virtual due to her work schedule and insurance copays/coinsurance.  Next follow is scheduled for 1/27 at 130pm and we will get her set up to check in with Tonya [name] as well.  Called for virtual follow up today.  Pt was tied up with work and unable to talk currently.  She was encouraged to call us back when she can.  Tonya [name] stopped returning calls or emails to us. We will discharge her from the virtual program at this time.      Comments: Discharge from Virutal Program 

## 2019-11-14 NOTE — Progress Notes (Signed)
Tonya [name] stopped returning calls or emails to [place]. We will discharge her from the virtual program at this time.

## 2019-12-12 ENCOUNTER — Other Ambulatory Visit: Payer: Self-pay

## 2019-12-12 ENCOUNTER — Ambulatory Visit (INDEPENDENT_AMBULATORY_CARE_PROVIDER_SITE_OTHER): Payer: 59 | Admitting: Internal Medicine

## 2019-12-12 ENCOUNTER — Encounter: Payer: Self-pay | Admitting: Internal Medicine

## 2019-12-12 VITALS — BP 168/74 | HR 77 | Ht 64.0 in | Wt 279.4 lb

## 2019-12-12 DIAGNOSIS — I214 Non-ST elevation (NSTEMI) myocardial infarction: Secondary | ICD-10-CM

## 2019-12-12 DIAGNOSIS — E785 Hyperlipidemia, unspecified: Secondary | ICD-10-CM

## 2019-12-12 DIAGNOSIS — I25118 Atherosclerotic heart disease of native coronary artery with other forms of angina pectoris: Secondary | ICD-10-CM

## 2019-12-12 DIAGNOSIS — I10 Essential (primary) hypertension: Secondary | ICD-10-CM | POA: Diagnosis not present

## 2019-12-12 MED ORDER — METOPROLOL SUCCINATE ER 25 MG PO TB24: 25.0000 mg | ORAL_TABLET | Freq: Every day | ORAL | 1 refills | Status: DC

## 2019-12-12 MED ORDER — ATORVASTATIN CALCIUM 40 MG PO TABS: 40.0000 mg | ORAL_TABLET | Freq: Every day | ORAL | 1 refills | Status: DC

## 2019-12-12 MED ORDER — LISINOPRIL 2.5 MG PO TABS: 2.5000 mg | ORAL_TABLET | Freq: Every day | ORAL | 1 refills | Status: DC

## 2019-12-12 NOTE — Patient Instructions (Signed)
Medication Instructions:  Your physician has recommended you make the following change in your medication:  1- START Atorvastatin 40 mg (1 tablet) by mouth daily in the evening. 2- DECREASE Lisinopril 2.5 mg by mouth once a day.   You may split your current 5 mg tablet in half and take half tablet (2.5 mg) daily.  I sent in another prescription for when you need it.  3- START Metoprolol succinate 25 mg (1 tablet) by mouth once a day.   *If you need a refill on your cardiac medications before your next appointment, please call your pharmacy*   Lab Work: Your physician recommends that you return for lab work in: 3 MONTHS  ~ March 16, 2020 for cholesterol check. - LIPID, CMP. - You will need to be fasting. Please do not have anything to eat or drink after midnight the morning you have the lab work. You may only have water or black coffee with no cream or sugar. - Please go to the [facility]. You will check in at the front desk to the right as you walk into the atrium. Valet Parking is offered if needed. - No appointment needed. You may go any day between 7 am and 6 pm.  If you have labs (blood work) drawn today and your tests are completely normal, you will receive your results only by: Marland Kitchen MyChart Message (if you have MyChart) OR . A paper copy in the mail If you have any lab test that is abnormal or we need to change your treatment, we will call you to review the results.   Testing/Procedures: none   Follow-Up: At [facility], you and your health needs are our priority.  As part of our continuing mission to provide you with exceptional heart care, we have created designated Provider Care Teams.  These Care Teams include your primary Cardiologist (physician) and Advanced Practice Providers (APPs -  Physician Assistants and Nurse Practitioners) who all work together to provide you with the care you need, when you need it.  We recommend signing up for the patient portal called  "MyChart".  Sign up information is provided on this After Visit Summary.  MyChart is used to connect with patients for Virtual Visits (Telemedicine).  Patients are able to view lab/test results, encounter notes, upcoming appointments, etc.  Non-urgent messages can be sent to your provider as well.   To learn more about what you can do with MyChart, go to [email or web address].    Your next appointment:   3 month(s)  The format for your next appointment:   In Person  Provider:    You may see Yvonne Kendall, MD or one of the following Advanced Practice Providers on your designated Care Team:    Nicolasa Ducking, NP  Eula Listen, PA-C  Marisue Ivan, PA-C

## 2019-12-12 NOTE — Progress Notes (Signed)
Follow-up Outpatient Visit Date: 12/12/2019  Primary Care Provider: [facility] [address] [place] [address]  Chief Complaint: Follow-up coronary artery disease  HPI:  Tonya [name] is a 54 y.o. female with history of coronary artery disease status post two-vessel PCI in the setting of NSTEMI in 07/2019, hypertension, hyperlipidemia, and diabetes mellitus, who presents for urgent evaluation of multiple complaints.  I last saw Tonya [name] in mid December, at which time she reported occasional shortness of breath and palpitations.  She had been doing relatively well up until the last 2 weeks.  She has experienced intermittent tightness and vague discomfort in her left arm and upper back.  The first episode happened a few hours after she had been to the gym.  It has subsequently recurred intermittently.  It is not exertional, as she is able to exercise without symptoms.  She is concerned because she experienced similar arm symptoms in the days leading up to her NSTEMI, though at the time of her actual presentation, she was experiencing significant chest pain that has not been present since PCI to the RCA and LCx.  She has noted occasional vague discomfort in her chest over the last few weeks, though that too is not consistent with angina she felt at the time of her NSTEMI.  Tonya [name] has also noted a few episodes of gasping for breath when trying to fall asleep.  She does not feel frankly dyspneic.  She actually feels like her breathing is better when she is up and moving around, including when exercising at the gym.  She noticed transient leg swelling when the aforementioned symptoms began.  She has been compliant with aspirin and ticagrelor, but has not restarted atorvastatin as advised at her last visit.  She is also taking lisinopril as needed when her blood pressure is above 125/75.  When taking it on a regular basis, she felt lethargic and also had generalized weakness when  exercising.  She recently had labs done through her PCP, which are notable for an LDL of 117 and a hemoglobin A1c of 6.1%.  -------------------------------------------------------------------------------------------------- Cardiovascular History & Procedures: Cardiovascular Problems:  Coronary artery disease status post NSTEMI (07/2019)  Risk Factors:  Known coronary artery disease, hypertension, hyperlipidemia, and diabetes mellitus.  Cath/PCI:  LHC/PCI (07/24/2019): LMCA normal.  LAD with 40% mid vessel stenosis.  LCx with sequential 90% mid and 30 to 40% distal lesions.  Dominant RCA with 100% mid vessel occlusion followed by 80% stenosis in the mid to distal vessel.  LVEF 50% with inferolateral hypokinesis.  Successful PCI to mid RCA using overlapping Resolute Onyx 3.0 x 12 mm and 2.75 x 38 mm drug-eluting stents.  Successful PCI to mid/distal LCx using Resolute Onyx 3.0 x 26 mm drug-eluting stent.  CV Surgery:  None  EP Procedures and Devices:  None  Non-Invasive Evaluation(s):  TTE (07/24/2019): Technically difficult study.  Normal LV size with LVEF 45-50%.  Normal RV size and function.  Mild mitral regurgitation.   Recent CV Pertinent Labs: Lab Results  Component Value Date   CHOL 197 07/23/2019   HDL 53 07/23/2019   LDLCALC 122 (H) 07/23/2019   TRIG 112 07/23/2019   CHOLHDL 3.7 07/23/2019   INR 1.0 07/23/2019   K 3.6 07/25/2019   BUN 9 07/25/2019   CREATININE 0.67 07/25/2019    Past medical and surgical history were reviewed and updated in EPIC.  Current Meds  Medication Sig  . aspirin EC 81 MG tablet Take 1  tablet (81 mg total) by mouth daily.  Marland Kitchen lisinopril (ZESTRIL) 2.5 MG tablet Take 1 tablet (2.5 mg total) by mouth daily.  . nitroGLYCERIN (NITROSTAT) 0.4 MG SL tablet Place 1 tablet (0.4 mg total) under the tongue every 5 (five) minutes as needed for chest pain.  Marland Kitchen thyroid (ARMOUR) 120 MG tablet Take 120 mg by mouth daily before breakfast.  .  ticagrelor (BRILINTA) 90 MG TABS tablet Take 1 tablet (90 mg total) by mouth 2 (two) times daily.  . [DISCONTINUED] lisinopril (ZESTRIL) 5 MG tablet Take 1 tablet (5 mg total) by mouth daily. (Patient taking differently: Take 5 mg by mouth daily as needed. )    Allergies: Patient has no known allergies.  Social History   Tobacco Use  . Smoking status: Never Smoker  . Smokeless tobacco: Never Used  Substance Use Topics  . Alcohol use: Not Currently    Comment: occ  . Drug use: Never    Family History  Problem Relation Age of Onset  . Heart Problems Father   . Heart attack Father   . Heart disease Father     Review of Systems: A 12-system review of systems was performed and was negative except as noted in the HPI.  --------------------------------------------------------------------------------------------------  Physical Exam: BP (!) 168/74 (BP Location: Left Arm, Patient Position: Sitting, Cuff Size: Large)   Pulse 77   Ht 5\' 4"  (1.626 m)   Wt 279 lb 6 oz (126.7 kg)   LMP 07/22/2016   SpO2 98%   BMI 47.95 kg/m   General: NAD. Neck: No JVD or HJR, though evaluation is limited by body habitus. Lungs: Clear to auscultation bilaterally without wheezes or crackles. Heart: Regular rate and rhythm without murmurs, rubs, or gallops. Abdomen: Obese and soft. Extremities: No lower extremity edema.  EKG: Normal sinus rhythm without significant abnormality.  Lab Results  Component Value Date   WBC 8.2 07/25/2019   HGB 13.4 07/25/2019   HCT 38.6 07/25/2019   MCV 89.6 07/25/2019   PLT 241 07/25/2019    Lab Results  Component Value Date   NA 137 07/25/2019   K 3.6 07/25/2019   CL 104 07/25/2019   CO2 24 07/25/2019   BUN 9 07/25/2019   CREATININE 0.67 07/25/2019   GLUCOSE 131 (H) 07/25/2019   ALT 22 07/24/2019    Lab Results  Component Value Date   CHOL 197 07/23/2019   HDL 53 07/23/2019   LDLCALC 122 (H) 07/23/2019   TRIG 112 07/23/2019   CHOLHDL 3.7  07/23/2019   Labs last week: TC 186, HDL 49, LDL 117 A1c 6.1 --------------------------------------------------------------------------------------------------  ASSESSMENT AND PLAN: Coronary artery disease with atypical angina: Tonya [name] had been doing well up until 2 weeks ago when she began to experience vague discomfort in her left arm, upper back, and chest somewhat reminiscent of what she experienced in the days prior to her NSTEMI last year.  Of note, she had typical angina when presenting with her MI.  This angina has not been present.  Nonetheless, Tonya [name] is concerned about the potential for recurrent CAD.  She has not had any exertional symptoms.  Examination and EKG today are unremarkable.  I wonder if uncontrolled hypertension may be contributing to some of her symptoms.  Given concern for weakness associated with lisinopril, we have agreed to decrease this to 2.5 mg daily.  We will add metoprolol succinate 25 mg daily to help with blood pressure control as well as antianginal therapy.  Tonya [name]  should continue with aspirin and ticagrelor.  She should let us know if her symptoms worsen so that we can consider noninvasive ischemia testing.  Ischemic cardiomyopathy: Tonya [name] appears grossly euvolemic on exam, though body habitus limits evaluation.  Transient dyspnea when in bed could reflect PND, though Tonya [name] otherwise denies symptoms of heart failure.  We will work on blood pressure control with titration of lisinopril and addition of metoprolol, as detailed above.  Hypertension: Blood pressure not well controlled today, though home readings are typically somewhat better.  As above, we will decrease lisinopril to 2.5 mg daily and add metoprolol succinate 25 mg daily.  Is well aware should continue to exercise and minimize sodium intake.  Hyperlipidemia: Tonya [name] previously discontinued atorvastatin due to diarrhea that she attributed to this medication.  I advised her to  begin taking 40 mg daily of atorvastatin at our last visit, though she never followed through with this.  Lipid panel obtained by her PCP last week is notable for suboptimal LDL of 117.  We have agreed to restart atorvastatin 40 mg daily with plans for repeat fasting lipid panel and ALT in about 3 months.  Morbid obesity: BMI greater than 40 with multiple comorbidities.  Weight loss encouraged through diet and exercise.  Follow-up: Return to clinic in 3 months.  Nelva Bush, MD 12/13/2019 6:57 AM

## 2019-12-13 ENCOUNTER — Encounter: Payer: Self-pay | Admitting: Internal Medicine

## 2020-03-20 ENCOUNTER — Ambulatory Visit: Payer: 59 | Admitting: Physician Assistant

## 2020-06-18 ENCOUNTER — Other Ambulatory Visit: Payer: Self-pay | Admitting: Internal Medicine

## 2020-06-18 NOTE — Telephone Encounter (Signed)
Patient moving and  will have to call back in December when she has less going on     Closing this encounter.  Placed recall for December

## 2020-06-18 NOTE — Telephone Encounter (Signed)
Please advise if ok to refill medication. Pt last seen 11/2019 due 02/2020 for 3 month f/u.

## 2020-06-18 NOTE — Telephone Encounter (Signed)
Pt overdue for 3 month f/u.  Please contact pt for future appointment. 

## 2020-06-20 NOTE — Telephone Encounter (Signed)
Refill for 30 days sent in with message to contact office for appointment for further refills.

## 2020-06-21 ENCOUNTER — Other Ambulatory Visit: Payer: Self-pay | Admitting: Internal Medicine

## 2020-06-22 ENCOUNTER — Other Ambulatory Visit: Payer: Self-pay | Admitting: Internal Medicine

## 2020-06-23 NOTE — Telephone Encounter (Signed)
Rx request sent to pharmacy.  

## 2020-09-25 ENCOUNTER — Telehealth: Payer: Self-pay | Admitting: Internal Medicine

## 2020-09-25 NOTE — Telephone Encounter (Signed)
Patient has been contacted at least 3 times for a recall, recall has been deleted  

## 2021-04-14 IMAGING — CR DG CHEST 2V
2 series · 2 of 2 positions shown · non-contrast
Comparison: None.

CLINICAL DATA: Chest pain.

EXAM:
CHEST - 2 VIEW

[chest pa]
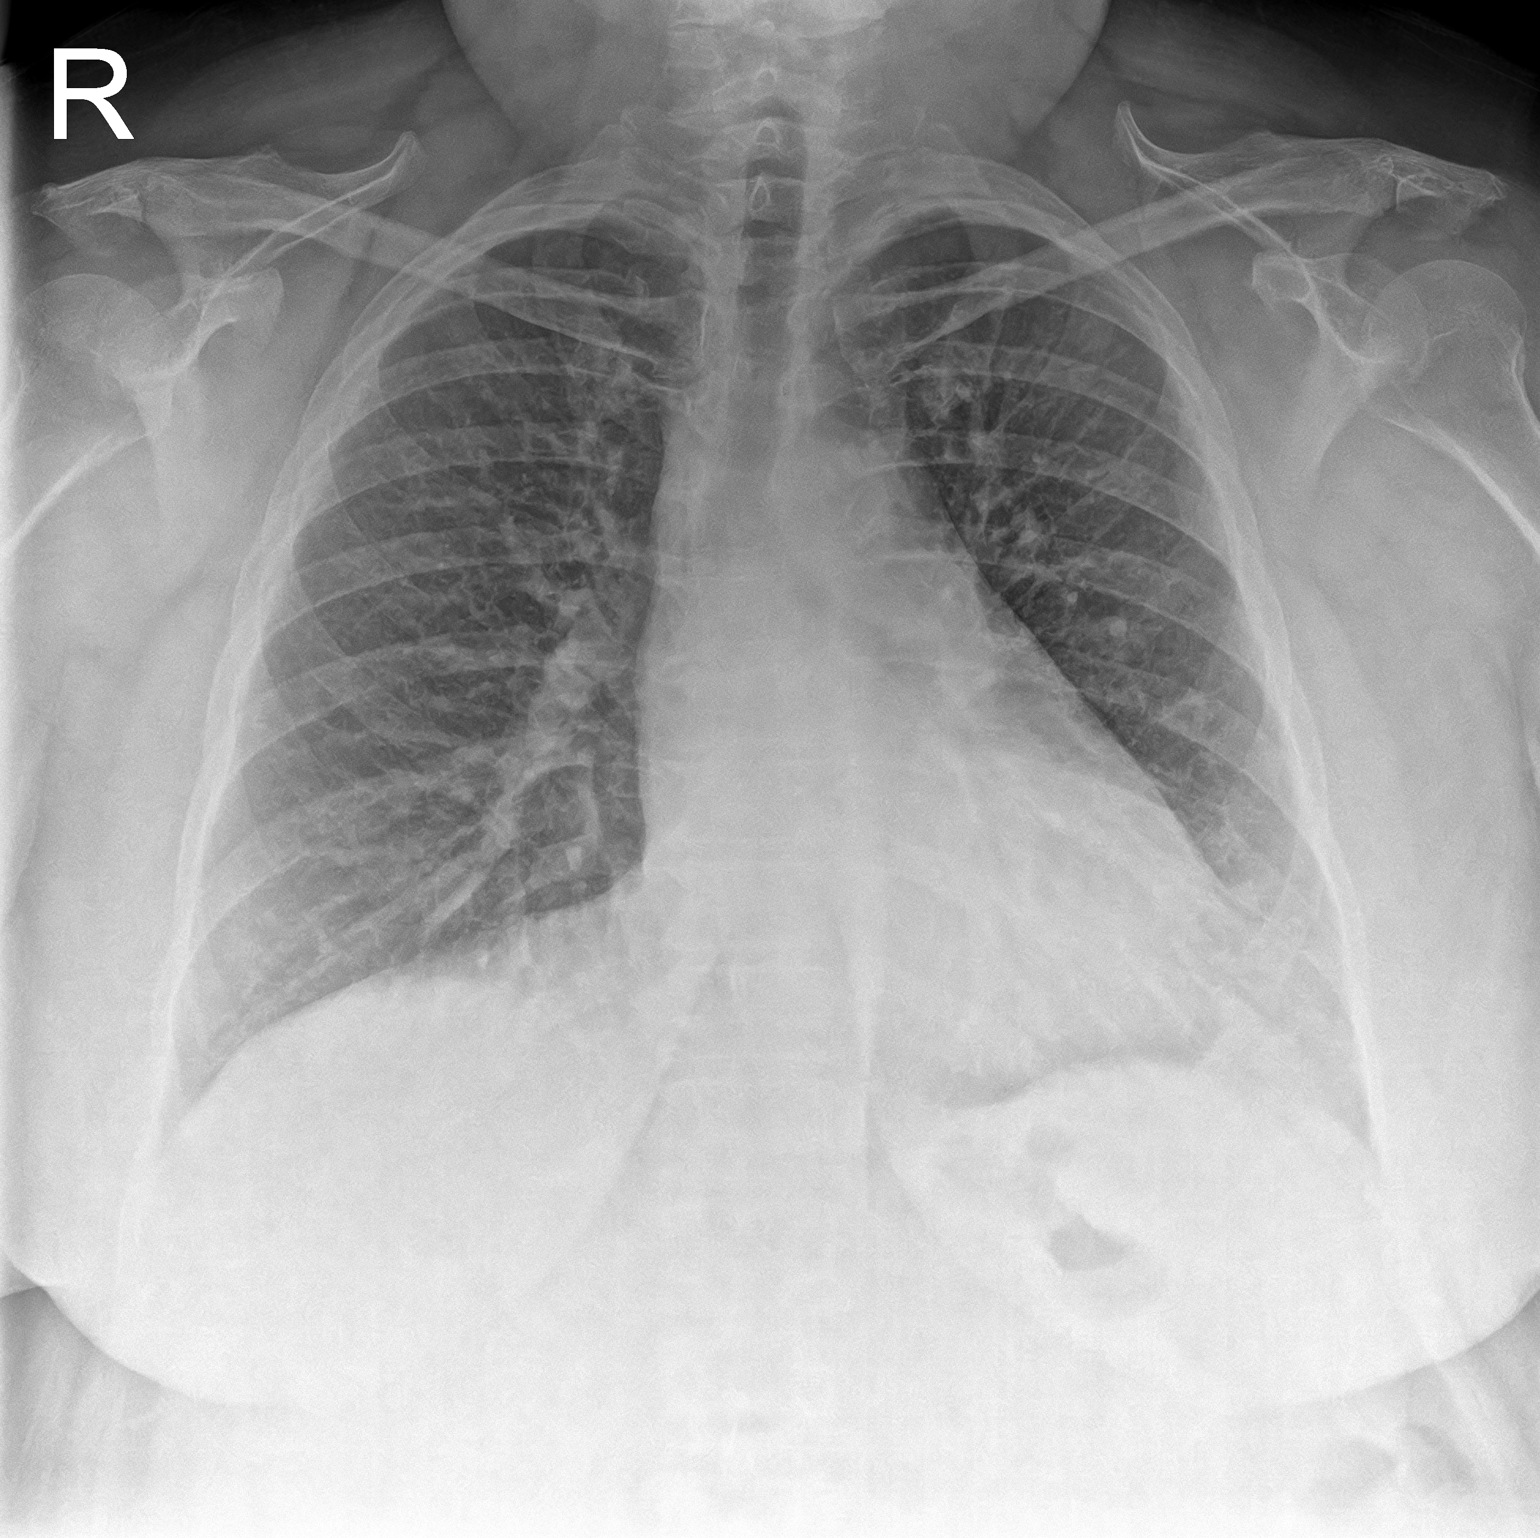

[chest lat]
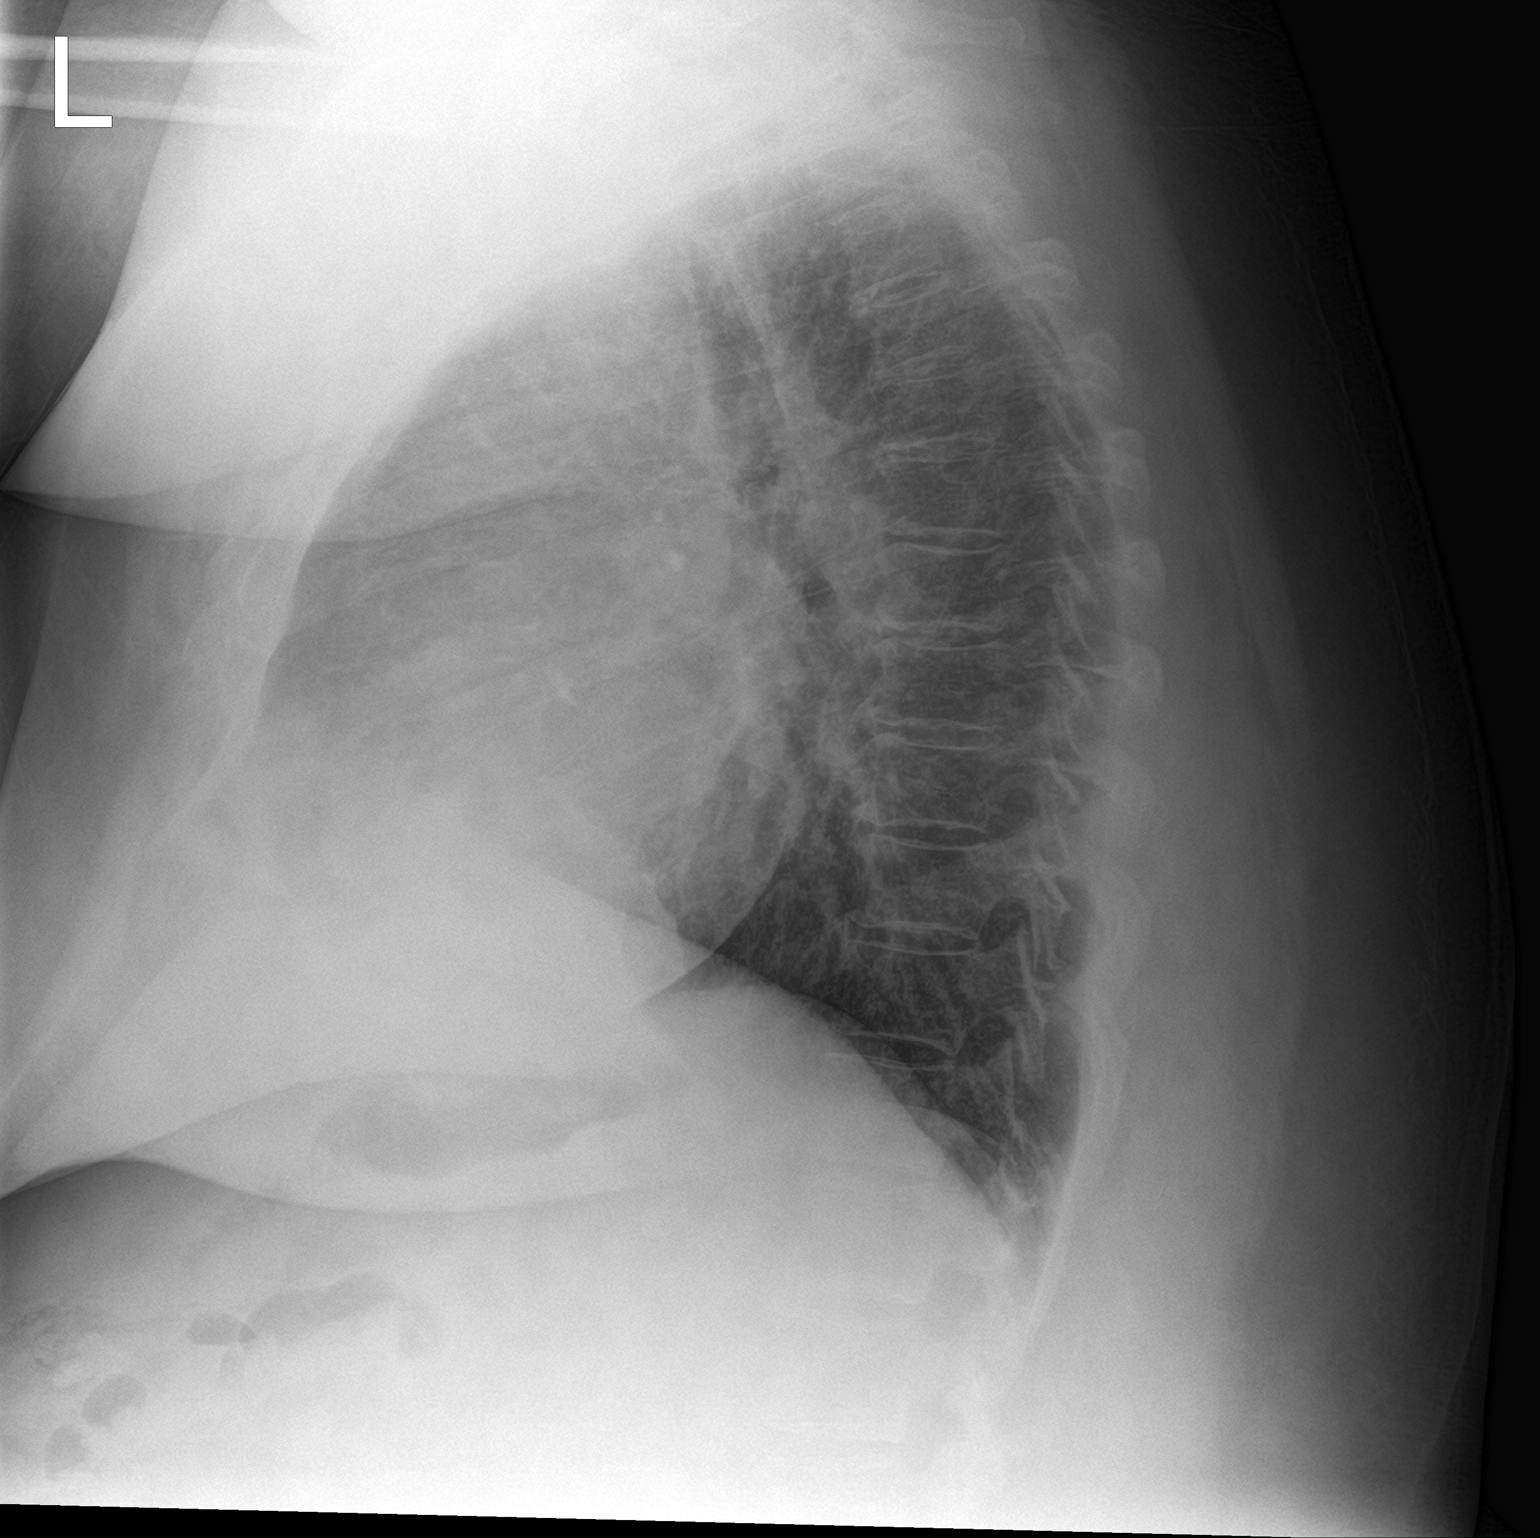

[2 of 2 positions shown; findings below may reference images not displayed]

FINDINGS: The heart size is exaggerated by low lung volumes. Lingular airspace
disease is present. Minimal patchy opacities are present in the
right middle or lower lobe as well. The upper lung fields are clear.
Degenerative changes are noted at the shoulders bilaterally.
IMPRESSION: 1. Lingular airspace disease compatible with pneumonia.
2. Minimal patchy opacities in the right middle or lower lobe likely
represent atelectasis. Infection is not excluded.
3. Low lung volumes.

## 2022-05-18 DIAGNOSIS — E1151 Type 2 diabetes mellitus with diabetic peripheral angiopathy without gangrene: Secondary | ICD-10-CM | POA: Insufficient documentation

## 2022-07-02 ENCOUNTER — Other Ambulatory Visit: Payer: Self-pay | Admitting: Orthopedic Surgery

## 2022-07-02 DIAGNOSIS — G8929 Other chronic pain: Secondary | ICD-10-CM

## 2022-07-02 DIAGNOSIS — M2391 Unspecified internal derangement of right knee: Secondary | ICD-10-CM

## 2022-07-02 DIAGNOSIS — M2351 Chronic instability of knee, right knee: Secondary | ICD-10-CM

## 2022-07-02 DIAGNOSIS — M1711 Unilateral primary osteoarthritis, right knee: Secondary | ICD-10-CM

## 2022-07-12 ENCOUNTER — Telehealth: Payer: Self-pay | Admitting: Internal Medicine

## 2022-07-12 ENCOUNTER — Ambulatory Visit
Admission: RE | Admit: 2022-07-12 | Discharge: 2022-07-12 | Disposition: A | Discharge status: Home / Self Care | Payer: 59 | Source: Ambulatory Visit | Attending: Orthopedic Surgery | Admitting: Orthopedic Surgery

## 2022-07-12 DIAGNOSIS — G8929 Other chronic pain: Secondary | ICD-10-CM

## 2022-07-12 DIAGNOSIS — M2351 Chronic instability of knee, right knee: Secondary | ICD-10-CM

## 2022-07-12 DIAGNOSIS — M1711 Unilateral primary osteoarthritis, right knee: Secondary | ICD-10-CM

## 2022-07-12 DIAGNOSIS — M2391 Unspecified internal derangement of right knee: Secondary | ICD-10-CM

## 2022-07-12 NOTE — Telephone Encounter (Signed)
From a post-PCI standpoint, it is fine to proceed with MRI.  As we haven't seen her in over 2.5 years, it is difficult to offer any additional guidance.  Yvonne Kendall, MD [facility]

## 2022-07-12 NOTE — Telephone Encounter (Signed)
Pt is requesting call back to make sure that her having the MR KNEE RT WO CONTRAST-GI  today would be okay. She states that she was told by other office it would be, but she wants to double check.

## 2022-07-12 NOTE — Telephone Encounter (Signed)
Pt updated and verbalized understanding.  

## 2022-07-23 ENCOUNTER — Ambulatory Visit: Payer: 59 | Attending: Internal Medicine | Admitting: Internal Medicine

## 2022-07-23 ENCOUNTER — Encounter: Payer: Self-pay | Admitting: Internal Medicine

## 2022-07-23 VITALS — BP 134/82 | HR 59 | Ht 64.0 in | Wt 281.0 lb

## 2022-07-23 DIAGNOSIS — I10 Essential (primary) hypertension: Secondary | ICD-10-CM

## 2022-07-23 DIAGNOSIS — I25119 Atherosclerotic heart disease of native coronary artery with unspecified angina pectoris: Secondary | ICD-10-CM | POA: Diagnosis not present

## 2022-07-23 DIAGNOSIS — E1169 Type 2 diabetes mellitus with other specified complication: Secondary | ICD-10-CM | POA: Diagnosis not present

## 2022-07-23 DIAGNOSIS — Z79899 Other long term (current) drug therapy: Secondary | ICD-10-CM

## 2022-07-23 DIAGNOSIS — E785 Hyperlipidemia, unspecified: Secondary | ICD-10-CM

## 2022-07-23 DIAGNOSIS — R079 Chest pain, unspecified: Secondary | ICD-10-CM

## 2022-07-23 MED ORDER — LOSARTAN POTASSIUM 25 MG PO TABS: 25.0000 mg | ORAL_TABLET | Freq: Every day | ORAL | 3 refills | Status: DC

## 2022-07-23 MED ORDER — NITROGLYCERIN 0.4 MG SL SUBL: 0.4000 mg | SUBLINGUAL_TABLET | SUBLINGUAL | 3 refills | Status: DC | PRN

## 2022-07-23 NOTE — Progress Notes (Unsigned)
Follow-up Outpatient Visit Date: 07/23/2022  Primary Care Provider: Danella Penton, MD [address] [facility] [facility] [place] [address]  Chief Complaint: Chest pain  HPI:  Tonya [name] is a 56 y.o. female with history of coronary artery disease status post two-vessel PCI in the setting of NSTEMI in 07/2019, hypertension, hyperlipidemia, and diabetes mellitus, who presents for follow-up of coronary artery disease.  She was last seen in our office in 11/2019, at which time she complained of a few episodes of PND.  I advised her to restart atorvastatin, which she had not been taking.  We also decreased her lisinopril to 2.5 mg daily and added metoprolol succinate.  She was supposed to follow-up in 3 months but has not returned until today.  However, she contacted our office last month asking if it would be okay to have a knee MRI given her prior PCI.  Today, Tonya [name] reports that she has been experiencing some sporadic burning and pressure in her chest.  She notices it most when she is stressed out.  He was also more pronounced when she was walking a lot in [place] this summer.  Since then, she has still noticed some pain off and on, often waxing and waning over the course of half a day.  It is not reminiscent of what she experienced before her MI in 2020.  She has not had any shortness of breath, palpitations, lightheadedness, or edema.  She was recently started on Ozempic and Jardiance for management of her diabetes mellitus.  She has not been taking any of her cardiac medicines regularly.  She was just started back on statin therapy earlier this year when she saw Dr. Hyacinth Meeker.   --------------------------------------------------------------------------------------------------  Cardiovascular History & Procedures: Cardiovascular Problems: Coronary artery disease status post NSTEMI (07/2019)   Risk Factors: Known coronary artery disease, hypertension,  hyperlipidemia, and diabetes mellitus.   Cath/PCI: LHC/PCI (07/24/2019): LMCA normal.  LAD with 40% mid vessel stenosis.  LCx with sequential 90% mid and 30 to 40% distal lesions.  Dominant RCA with 100% mid vessel occlusion followed by 80% stenosis in the mid to distal vessel.  LVEF 50% with inferolateral hypokinesis.  Successful PCI to mid RCA using overlapping Resolute Onyx 3.0 x 12 mm and 2.75 x 38 mm drug-eluting stents.  Successful PCI to mid/distal LCx using Resolute Onyx 3.0 x 26 mm drug-eluting stent.   CV Surgery: None   EP Procedures and Devices: None   Non-Invasive Evaluation(s): TTE (07/24/2019): Technically difficult study.  Normal LV size with LVEF 45-50%.  Normal RV size and function.  Mild mitral regurgitation.  Recent CV Pertinent Labs: Lab Results  Component Value Date   CHOL 197 07/23/2019   HDL 53 07/23/2019   LDLCALC 122 (H) 07/23/2019   TRIG 112 07/23/2019   CHOLHDL 3.7 07/23/2019   INR 1.0 07/23/2019   K 3.6 07/25/2019   BUN 9 07/25/2019   CREATININE 0.67 07/25/2019    Past medical and surgical history were reviewed and updated in EPIC.  Current Meds  Medication Sig   aspirin EC 81 MG tablet Take 81 mg by mouth daily.   JARDIANCE 10 MG TABS tablet Take 10 mg by mouth daily.   OZEMPIC, 0.25 OR 0.5 MG/DOSE, 2 MG/3ML SOPN Inject 0.5 mg into the skin once a week.   rosuvastatin (CRESTOR) 10 MG tablet Take 10 mg by mouth daily.   thyroid (ARMOUR) 120 MG tablet Take 120 mg by mouth daily before breakfast.  Allergies: Patient has no known allergies.  Social History   Tobacco Use   Smoking status: Never   Smokeless tobacco: Never  Vaping Use   Vaping Use: Never used  Substance Use Topics   Alcohol use: Not Currently    Comment: occ   Drug use: Never    Family History  Problem Relation Age of Onset   Atrial fibrillation Mother    Heart Problems Father    Heart attack Father    Heart disease Father     Review of Systems: A 12-system review  of systems was performed and was negative except as noted in the HPI.  --------------------------------------------------------------------------------------------------  Physical Exam: BP 134/82 (BP Location: Left Arm, Patient Position: Sitting, Cuff Size: Large)   Pulse (!) 59   Ht 5\' 4"  (1.626 m)   Wt 281 lb (127.5 kg)   LMP 07/22/2016   SpO2 98%   BMI 48.23 kg/m   General:  NAD. Neck: Unable to assess JVP due to body habitus. Lungs: Clear to auscultation bilaterally without wheezes or crackles. Heart: Regular rate and rhythm without murmurs, rubs, or gallops. Abdomen: Soft, nontender, nondistended. Extremities: Trace pretibial edema.  EKG: Sinus bradycardia with low voltage.  Lab Results  Component Value Date   WBC 8.2 07/25/2019   HGB 13.4 07/25/2019   HCT 38.6 07/25/2019   MCV 89.6 07/25/2019   PLT 241 07/25/2019    Lab Results  Component Value Date   NA 137 07/25/2019   K 3.6 07/25/2019   CL 104 07/25/2019   CO2 24 07/25/2019   BUN 9 07/25/2019   CREATININE 0.67 07/25/2019   GLUCOSE 131 (H) 07/25/2019   ALT 22 07/24/2019    Lab Results  Component Value Date   CHOL 197 07/23/2019   HDL 53 07/23/2019   LDLCALC 122 (H) 07/23/2019   TRIG 112 07/23/2019   CHOLHDL 3.7 07/23/2019    --------------------------------------------------------------------------------------------------  ASSESSMENT AND PLAN: Coronary artery disease with angina: Tonya [name] reports some sporadic chest pain that has both typical and atypical features.  It seems to be worse when she is under stress and was particularly pronounced when walking more than usual in [place] this past summer.  She has not been on good lipid or diabetes therapy up until a few months ago when she reestablished care with Dr. 06-18-1976.  Given her history of multivessel CAD status post two-vessel PCI in 2020, I worry that she may have developed progression of her coronary artery disease since then.  We have  discussed further evaluation options and have agreed to obtain a myocardial PET/CT stress test.  In the meantime, we will continue aspirin and rosuvastatin.  New prescription for sublingual nitroglycerin has been provided.  Hypertension: Blood pressure mildly elevated today.  We have agreed to add losartan 25 mg daily given her diabetes mellitus.  We will recheck a BMP in 2 weeks.  Hyperlipidemia associated with type 2 diabetes mellitus: Lipids poorly controlled on last check with Dr. 2021 in September, at which time LDL was 162.  Rosuvastatin was started at that time.  We discussed repeating a lipid panel today or with blood draw in 2 weeks, though Tonya [name] wishes to defer this until she sees Dr. [place] again next month.  If LDL remains above 70, further escalation of rosuvastatin should be undertaken.  Ongoing management of DM per Dr. Hyacinth Meeker.  Morbid obesity: BMI greater than 40 with multiple comorbidities (CAD, hypertension, and type 2 diabetes mellitus).  Weight loss encouraged  through diet and exercise.  Hopefully, semaglutide will also offer some benefit.  Shared Decision Making/Informed Consent The risks [chest pain, shortness of breath, cardiac arrhythmias, dizziness, blood pressure fluctuations, myocardial infarction, stroke/transient ischemic attack, nausea, vomiting, allergic reaction, radiation exposure, metallic taste sensation and life-threatening complications (estimated to be 1 in 10,000)], benefits (risk stratification, diagnosing coronary artery disease, treatment guidance) and alternatives of a cardiac PET stress test were discussed in detail with Tonya [name] and she agrees to proceed.  Follow-up: Return to clinic in 3 months.  Yvonne Kendall, MD 07/23/2022 10:03 AM

## 2022-07-23 NOTE — Patient Instructions (Addendum)
Medication Instructions:  START Losartan 25 mg daily  *If you need a refill on your cardiac medications before your next appointment, please call your pharmacy*   Lab Work: Your provider would like for you to return in 2 week to have the following labs drawn: (BMP).   Please go to the [facility] entrance and check in at the front desk.  You do not need an appointment.  They are open from 7am-6 pm.  You will not need to be fasting.  If you have labs (blood work) drawn today and your tests are completely normal, you will receive your results only by: MyChart Message (if you have MyChart) OR A paper copy in the mail If you have any lab test that is abnormal or we need to change your treatment, we will call you to review the results.   Testing/Procedures: Cardiac PET Scan (instructions listed below)   Follow-Up: At [facility], you and your health needs are our priority.  As part of our continuing mission to provide you with exceptional heart care, we have created designated Provider Care Teams.  These Care Teams include your primary Cardiologist (physician) and Advanced Practice Providers (APPs -  Physician Assistants and Nurse Practitioners) who all work together to provide you with the care you need, when you need it.  We recommend signing up for the patient portal called "MyChart".  Sign up information is provided on this After Visit Summary.  MyChart is used to connect with patients for Virtual Visits (Telemedicine).  Patients are able to view lab/test results, encounter notes, upcoming appointments, etc.  Non-urgent messages can be sent to your provider as well.   To learn more about what you can do with MyChart, go to [email or web address].    Your next appointment:   3 month(s)  The format for your next appointment:   In Person  Provider:   You may see Yvonne Kendall, MD or one of the following Advanced Practice Providers on your designated Care Team:    Nicolasa Ducking, NP Eula Listen, PA-C Cadence Fransico Michael, PA-C Charlsie Quest, NP    How to Prepare for Your Cardiac PET/CT Stress Test:  1. Please do not take these medications before your test:   Medications that may interfere with the cardiac pharmacological stress agent (ex. nitrates - including erectile dysfunction medications or beta-blockers) the day of the exam. (Erectile dysfunction medication should be held for at least 72 hrs prior to test) Theophylline containing medications for 12 hours. Dipyridamole 48 hours prior to the test. Your remaining medications may be taken with water.  2. Nothing to eat or drink, except water, 3 hours prior to arrival time.   NO caffeine/decaffeinated products, or chocolate 12 hours prior to arrival.  3. NO perfume, cologne or lotion  4. Total time is 1 to 2 hours; you may want to bring reading material for the waiting time.  5. Please report to Admitting at the [facility] Main Entrance 60 minutes early for your test.  [address] [place], [place] [address]  Diabetic Preparation:  Hold oral medications (Jardiace morning of test). You may take NPH and Lantus insulin. Do not take Humalog or Humulin R (Regular Insulin) the day of your test. Check blood sugars prior to leaving the house. If able to eat breakfast prior to 3 hour fasting, you may take all medications, including your insulin, Do not worry if you miss your breakfast dose of insulin - start at your next  meal.  IF YOU THINK YOU MAY BE PREGNANT, OR ARE NURSING PLEASE INFORM THE TECHNOLOGIST.  In preparation for your appointment, medication and supplies will be purchased.  Appointment availability is limited, so if you need to cancel or reschedule, please call the Radiology Department at [phone]  24 hours in advance to avoid a cancellation fee of $100.00  What to Expect After you Arrive:  Once you arrive and check in for your appointment, you will be taken to a  preparation room within the Radiology Department.  A technologist or Nurse will obtain your medical history, verify that you are correctly prepped for the exam, and explain the procedure.  Afterwards,  an IV will be started in your arm and electrodes will be placed on your skin for EKG monitoring during the stress portion of the exam. Then you will be escorted to the PET/CT scanner.  There, staff will get you positioned on the scanner and obtain a blood pressure and EKG.  During the exam, you will continue to be connected to the EKG and blood pressure machines.  A small, safe amount of a radioactive tracer will be injected in your IV to obtain a series of pictures of your heart along with an injection of a stress agent.    After your Exam:  It is recommended that you eat a meal and drink a caffeinated beverage to counter act any effects of the stress agent.  Drink plenty of fluids for the remainder of the day and urinate frequently for the first couple of hours after the exam.  Your doctor will inform you of your test results within 7-10 business days.  For questions about your test or how to prepare for your test, please call: Rockwell Alexandria, Cardiac Imaging Nurse Navigator  Larey Brick, Cardiac Imaging Nurse Navigator Office: [phone]

## 2022-07-24 ENCOUNTER — Encounter: Payer: Self-pay | Admitting: Internal Medicine

## 2022-07-24 DIAGNOSIS — I25119 Atherosclerotic heart disease of native coronary artery with unspecified angina pectoris: Secondary | ICD-10-CM | POA: Insufficient documentation

## 2022-08-04 ENCOUNTER — Telehealth: Payer: Self-pay | Admitting: Internal Medicine

## 2022-08-04 DIAGNOSIS — I25119 Atherosclerotic heart disease of native coronary artery with unspecified angina pectoris: Secondary | ICD-10-CM

## 2022-08-04 NOTE — Telephone Encounter (Signed)
We could do a pharmacologic myocardial perfusion stress test (SPECT/CT), which could likely be scheduled before the Tonya [name] of the year.  However, the potential for inconclusive results is higher with this test compared to PET/CT in the setting of her morbid obesity (BMI > 40).  Tonya Kendall, MD [facility]

## 2022-08-04 NOTE — Telephone Encounter (Signed)
Pt made aware of MD recommendation and stated she would like to proceed with Lexiscan.  Orders placed and message sent to scheduling.

## 2022-08-04 NOTE — Telephone Encounter (Signed)
Patient stated she is unable to get the NM PET CT CARDIAC PERFUSION MULTI W/ABSOLUTE BLOODFLOW test scheduled before the end of the year when her insurance will end.  Patient wants to know if there is an alternate test she could have done.

## 2022-08-04 NOTE — Telephone Encounter (Signed)
To Dr. Okey Dupre to advise.

## 2022-08-20 ENCOUNTER — Telehealth: Payer: Self-pay | Admitting: Internal Medicine

## 2022-08-20 ENCOUNTER — Encounter
Admission: RE | Admit: 2022-08-20 | Discharge: 2022-08-20 | Disposition: A | Discharge status: Home / Self Care | Payer: 59 | Source: Ambulatory Visit | Attending: Internal Medicine | Admitting: Internal Medicine

## 2022-08-20 DIAGNOSIS — I25119 Atherosclerotic heart disease of native coronary artery with unspecified angina pectoris: Secondary | ICD-10-CM

## 2022-08-20 LAB — NM MYOCAR MULTI W/SPECT W/WALL MOTION / EF
LV dias vol: 85 mL (ref 46–106)
LV sys vol: 30 mL
Nuc Stress EF: 65 %
Peak HR: 94 {beats}/min
Rest HR: 58 {beats}/min
Rest Nuclear Isotope Dose: 10.7 mCi
SDS: 9
SRS: 9
SSS: 12
ST Depression (mm): 0 mm
Stress Nuclear Isotope Dose: 30.1 mCi
TID: 0.98

## 2022-08-20 MED ORDER — TECHNETIUM TC 99M TETROFOSMIN IV KIT
10.6500 | PACK | Freq: Once | INTRAVENOUS | Status: AC | PRN
  Administered 2022-08-20: 10.65 via INTRAVENOUS

## 2022-08-20 MED ORDER — TECHNETIUM TC 99M TETROFOSMIN IV KIT
30.0900 | PACK | Freq: Once | INTRAVENOUS | Status: AC | PRN
  Administered 2022-08-20: 30.09 via INTRAVENOUS

## 2022-08-20 MED ORDER — REGADENOSON 0.4 MG/5ML IV SOLN
0.4000 mg | Freq: Once | INTRAVENOUS | Status: AC
  Administered 2022-08-20: 0.4 mg via INTRAVENOUS

## 2022-08-20 NOTE — Telephone Encounter (Signed)
The patient has been made aware. She stated that once she took off her bra with the metal that the pain went away. She denies any current pain.   She will call back if she has recurrent chest pain. She has been made aware that we are closed on Monday but do have providers on call.

## 2022-08-20 NOTE — Telephone Encounter (Signed)
Pt c/o of Chest Pain: STAT if CP now or developed within 24 hours  1. Are you having CP right now? yes  2. Are you experiencing any other symptoms (ex. SOB, nausea, vomiting, sweating)? no  3. How long have you been experiencing CP? 5 minutes  4. Is your CP continuous or coming and going? Continuous   5. Have you taken Nitroglycerin? no ?   Patient states had a stress test today and finished it at 11:30 am. She says she was feeling fine until now. She says she is having chest pain and no other symptoms. She says she is not sure if caffeine would help.

## 2022-08-20 NOTE — Telephone Encounter (Signed)
Returned call to patient and made her aware of Dr. Jari Sportsman recommendations. Patient aware and verbalized understanding. Patient states that she now feels better and has not had any return of chest pain. Advised patient to call back to office with any issues, questions, or concerns. Patient verbalized understanding.

## 2022-08-20 NOTE — Telephone Encounter (Signed)
Received stat call into triage from patient. Patient reports that she had lexiscan this morning and states that while driving home a few minutes ago she had a sharp pain in her chest area. Patient reports that it only lasted a couple minutes. Patient denies any palpitations, and denies any shortness of breath. Patient reports that this does not feel like any pain that she had when she required heart cath back in 2020. Patient reports that she was wearing a tight fitting bra and since removing that she does feel better. Patient concerned that this could also be from the medication used during LexiScan. Patient would like to know if this is normal and how long to expect this- patient states she received dose around 1045 this morning. Patient would also like to know if Dr. Okey Dupre can review results and states she is not expecting call back but just wanted Dr. Okey Dupre to review before the week and call with any issues. Advised patient I would forward message over to Dr. Okey Dupre and [facility] team for review- patient verbalized understanding.   Made patient aware of ED precautions should new or worsening symptoms develop. Patient verbalized understanding.

## 2022-08-20 NOTE — Telephone Encounter (Signed)
I reviewed her stress test and it was normal.  Lexiscan occasionally can cause chest discomfort but usually only the first 5 to 10 minutes of infusion.  I suspect that her chest pain was likely musculoskeletal.  She should continue to monitor her symptoms for now and let us know if she has recurrent chest pain.

## 2022-11-03 ENCOUNTER — Ambulatory Visit: Payer: 59 | Admitting: Internal Medicine

## 2023-05-25 DIAGNOSIS — E559 Vitamin D deficiency, unspecified: Secondary | ICD-10-CM | POA: Insufficient documentation

## 2023-05-25 DIAGNOSIS — E538 Deficiency of other specified B group vitamins: Secondary | ICD-10-CM | POA: Insufficient documentation

## 2023-08-23 NOTE — Progress Notes (Signed)
 Patient Profile:   Tonya [name]  is a 57 y.o.  female Chief Complaint  Patient presents with  . Abdominal Pain    Patient states her stomach pain ranges 8-10/10. Patient stated she's tried suppositories, dulcelax, prune juice. She states her last bowel movement was today. She states she quit taking most of her meds to try to rule out the problem       PROBLEM LIST: Past Medical History:  Diagnosis Date  . Coronary artery disease   . Diabetes mellitus without complication (CMS/HHS-HCC)   . Hypothyroidism   . Thyroid  disease     Past Surgical History:  Procedure Laterality Date  . CORONARY STENT INTERVENTION  07/24/2019   End, Lonni, MD  . LEFT HEART CATH AND CORONARY ANGIOGRAPHY  07/24/2019   Hester Wolm PARAS, MD  . CARDIAC CATHETERIZATION    . CESAREAN SECTION    . TONSILLECTOMY      ALLERGIES: No Known Allergies  CURRENT MEDICATIONS: Current Outpatient Medications  Medication Sig Dispense Refill  . ARMOUR THYROID  120 mg tablet Take 1 tablet (120 mg total) by mouth every morning before breakfast (0630) 90 tablet 3  . cholecalciferol 1000 unit tablet Take 1 tablet (1,000 Units total) by mouth once daily    . cyanocobalamin  (VITAMIN B12) 1,000 mcg/mL injection Inject 1 mL (1,000 mcg total) into the muscle monthly 10 mL 2  . empagliflozin (JARDIANCE) 10 mg tablet Take 1 tablet (10 mg total) by mouth once daily 90 tablet 3  . ergocalciferol, vitamin D2, 1,250 mcg (50,000 unit) capsule Take 1 capsule (50,000 Units total) by mouth once a week 4 capsule 6  . multivitamin tablet Take 1 tablet by mouth once daily    . rosuvastatin (CRESTOR) 10 MG tablet Take 1 tablet (10 mg total) by mouth once daily 90 tablet 3  . syringe with needle 1 mL 25 gauge x 1 syringe Use as directed for up to 30 days 1 each 11  . tirzepatide (MOUNJARO) 5 mg/0.5 mL pen injector Inject 0.5 mLs (5 mg total) subcutaneously once a week 2 mL 3   No current  facility-administered medications for this visit.      HPI   CLINICAL SUMMARY:  Patient with progressive abdominal pain.  Switch Ozempic to Mounjaro about 3 weeks ago and then had a cold around Thanksgiving.  Her eating change quite a bit and she started having progressive constipation about that time.  Over the last 10-14 days she has been on antibiotics, normal lab work, try to get her bowels moving.  Stopped all of her medicine except her thyroid  about 3 to 4 days ago.  ROS: Review of systems is unremarkable for any active cardiac, respiratory, GI, GU, hematologic, neurologic, dermatologic, HEENT, or psychiatric symptoms except as noted above, 10 systems reviewed.  No fevers, chills, or constitutional symptoms.   PHYSICAL EXAM  Vital signs:  BP (!) 150/90   Pulse 80   Wt (!) 124.7 kg (275 lb)   SpO2 94%   BMI 48.73 kg/m  Body mass index is 48.73 kg/m.   Wt Readings from Last 3 Encounters:  08/23/23 (!) 124.7 kg (275 lb)  08/12/23 (!) 125.6 kg (277 lb)  05/25/23 (!) 128.3 kg (282 lb 12.8 oz)     BP Readings from Last 3 Encounters:  08/23/23 (!) 150/90  08/12/23 ROLLEN)  150/80  05/25/23 (!) 140/78    Constitutional:NAD Neck: supple, no thyromegaly, good ROM Respiratory:clear to auscultation, no rales or wheezes Cardiovascular:RRR, no murmur or gallop Abdominal:soft, good BS, mild diffuse tenderness Ext: no edema, good peripheral pulses Neuro: alert and oriented X 3, grossly nonfocal     ASSESSMENT/PLAN   Severe obstipation-secondary to mounjaro.  No clear indication for CT scan, normal lab work.  Secondary to mounjaro.  Will use warm prune juice twice daily, MiraLAX  twice daily, max dose Milk of Magnesia x 4 days, Dulcolax suppositories twice daily, fruit, soft diet  Once gradually get going, might consider going back on Mounjaro but the lowest dose, will only be on thyroid  medicine for now  Dispo:   No follow-ups on file.

## 2023-08-29 ENCOUNTER — Other Ambulatory Visit: Payer: Self-pay | Admitting: Internal Medicine

## 2023-08-29 ENCOUNTER — Ambulatory Visit
Admission: RE | Admit: 2023-08-29 | Discharge: 2023-08-29 | Disposition: A | Discharge status: Home / Self Care | Payer: BC Managed Care – PPO | Source: Ambulatory Visit | Attending: Internal Medicine | Admitting: Internal Medicine

## 2023-08-29 DIAGNOSIS — R100 Acute abdomen: Secondary | ICD-10-CM

## 2023-08-29 DIAGNOSIS — R1084 Generalized abdominal pain: Secondary | ICD-10-CM | POA: Diagnosis present

## 2023-08-29 MED ORDER — IOHEXOL 300 MG/ML  SOLN
100.0000 mL | Freq: Once | INTRAMUSCULAR | Status: AC | PRN
  Administered 2023-08-29: 100 mL via INTRAVENOUS

## 2023-08-29 NOTE — Progress Notes (Signed)
 Patient Profile:   Tonya [name]  is a 58 y.o.  female No chief complaint on file.     PROBLEM LIST: Past Medical History:  Diagnosis Date  . Coronary artery disease   . Diabetes mellitus without complication (CMS/HHS-HCC)   . Hypothyroidism   . Thyroid  disease     Past Surgical History:  Procedure Laterality Date  . CORONARY STENT INTERVENTION  07/24/2019   End, Lonni, MD  . LEFT HEART CATH AND CORONARY ANGIOGRAPHY  07/24/2019   Hester Wolm PARAS, MD  . CARDIAC CATHETERIZATION    . CESAREAN SECTION    . TONSILLECTOMY      ALLERGIES: No Known Allergies  CURRENT MEDICATIONS: Current Outpatient Medications  Medication Sig Dispense Refill  . ARMOUR THYROID  120 mg tablet Take 1 tablet (120 mg total) by mouth every morning before breakfast (0630) 90 tablet 3  . bisacodyL  (DULCOLAX) 10 mg suppository Place 1 suppository (10 mg total) rectally 2 (two) times daily for 10 days 12 suppository 0  . cholecalciferol 1000 unit tablet Take 1 tablet (1,000 Units total) by mouth once daily    . cyanocobalamin  (VITAMIN B12) 1,000 mcg/mL injection Inject 1 mL (1,000 mcg total) into the muscle monthly 10 mL 2  . empagliflozin (JARDIANCE) 10 mg tablet Take 1 tablet (10 mg total) by mouth once daily 90 tablet 3  . ergocalciferol, vitamin D2, 1,250 mcg (50,000 unit) capsule Take 1 capsule (50,000 Units total) by mouth once a week 4 capsule 6  . hyoscyamine (LEVSIN/SL) 0.125 mg SL tablet Place 1 tablet (0.125 mg total) under the tongue every 4 (four) hours as needed for Cramping for up to 10 days 30 tablet 0  . multivitamin tablet Take 1 tablet by mouth once daily    . rosuvastatin (CRESTOR) 10 MG tablet Take 1 tablet (10 mg total) by mouth once daily 90 tablet 3  . syringe with needle 1 mL 25 gauge x 1 syringe Use as directed for up to 30 days 1 each 11  . tirzepatide (MOUNJARO) 5 mg/0.5 mL pen injector Inject 0.5 mLs (5 mg total) subcutaneously once  a week 2 mL 3   No current facility-administered medications for this visit.      HPI   CLINICAL SUMMARY:  Patient has had progressive abdominal pain now for several weeks.  Thought to be obstipation from her mounjaro and she underwent significant bowel medications as noted in the prior note with really no improvement in the pain.  Notes severe bilateral lower quadrant tenderness.  She is very frustrated and really has progressive symptoms, very scared as to what is going on.  She has lost 9 pounds.  Moving her bowels to Milk of Magnesia etc. really has not helped her pain at all.  ROS: Review of systems is unremarkable for any active cardiac, respiratory, GI, GU, hematologic, neurologic, dermatologic, HEENT, or psychiatric symptoms except as noted above, 10 systems reviewed.  No fevers, chills, or constitutional symptoms.   PHYSICAL EXAM  Vital signs:  BP (!) 180/100   Pulse 89   Wt (!) 120.7 kg (266 lb 3.2 oz)   SpO2 96%   BMI 47.17 kg/m  Body mass index is 47.17 kg/m.   Wt Readings from Last 3 Encounters:  08/29/23 (!) 120.7 kg (266 lb 3.2 oz)  08/23/23 (!) 124.7 kg (275 lb)  08/12/23 (!) 125.6 kg (277 lb)     BP Readings from Last 3 Encounters:  08/29/23 (!) 180/100  08/23/23 (!) 150/90  08/12/23 (!) 150/80    Constitutional: Acute distress Neck: supple, no thyromegaly, good ROM Respiratory:clear to auscultation, no rales or wheezes Cardiovascular:RRR, no murmur or gallop Abdominal: Decreased bowel sounds, generalized moderate/severe abdominal tenderness, right lower quadrant greater than left lower quadrant Ext: no edema, good peripheral pulses Neuro: alert and oriented X 3, grossly nonfocal     ASSESSMENT/PLAN   Acute abdomen/generalized abdominal pain-progressive over the last few weeks, not responding to conservative therapy.  Full laxatives have not helped her symptoms at all. Recheck stat labs,'s acute CT stat this morning, looking for progressive  appendicitis.  Could have a progressive diverticulitis.  Appears to have likely an infectious etiology, doubt small bowel obstruction  Dispo:   No follow-ups on file.

## 2023-08-30 NOTE — Progress Notes (Signed)
 Patient Profile:   Tonya [name]  is a 58 y.o.  female Chief Complaint  Patient presents with  . Review Scan    Reports abdominal pain is at 8.      PROBLEM LIST: Past Medical History:  Diagnosis Date  . Coronary artery disease   . Diabetes mellitus without complication (CMS/HHS-HCC)   . Hypothyroidism   . Thyroid  disease     Past Surgical History:  Procedure Laterality Date  . CORONARY STENT INTERVENTION  07/24/2019   End, Lonni, MD  . LEFT HEART CATH AND CORONARY ANGIOGRAPHY  07/24/2019   Hester Wolm PARAS, MD  . CARDIAC CATHETERIZATION    . CESAREAN SECTION    . TONSILLECTOMY      ALLERGIES: No Known Allergies  CURRENT MEDICATIONS: Current Outpatient Medications  Medication Sig Dispense Refill  . ARMOUR THYROID  120 mg tablet Take 1 tablet (120 mg total) by mouth every morning before breakfast (0630) 90 tablet 3  . bisacodyL  (DULCOLAX) 10 mg suppository Place 1 suppository (10 mg total) rectally 2 (two) times daily for 10 days (Patient taking differently: Place 10 mg rectally 2 (two) times daily ON HOLD) 12 suppository 0  . cholecalciferol 1000 unit tablet Take 1,000 Units by mouth once daily ON HOLD    . cyanocobalamin  (VITAMIN B12) 1,000 mcg/mL injection Inject 1 mL (1,000 mcg total) into the muscle monthly (Patient taking differently: Inject 1,000 mcg into the muscle monthly ON HOLD) 10 mL 2  . empagliflozin (JARDIANCE) 10 mg tablet Take 1 tablet (10 mg total) by mouth once daily 90 tablet 3  . ergocalciferol, vitamin D2, 1,250 mcg (50,000 unit) capsule Take 1 capsule (50,000 Units total) by mouth once a week (Patient taking differently: Take 50,000 Units by mouth once a week ON HOLD) 4 capsule 6  . hyoscyamine (LEVSIN/SL) 0.125 mg SL tablet Place 1 tablet (0.125 mg total) under the tongue every 4 (four) hours as needed for Cramping for up to 10 days (Patient taking differently: Place 0.125 mg under the tongue every 4  (four) hours as needed for Cramping ON HOLD) 30 tablet 0  . multivitamin tablet Take 1 tablet by mouth once daily    . rosuvastatin (CRESTOR) 10 MG tablet Take 1 tablet (10 mg total) by mouth once daily (Patient taking differently: Take 10 mg by mouth once daily ON HOLD) 90 tablet 3  . syringe with needle 1 mL 25 gauge x 1 syringe Use as directed for up to 30 days 1 each 11  . tirzepatide (MOUNJARO) 5 mg/0.5 mL pen injector Inject 0.5 mLs (5 mg total) subcutaneously once a week (Patient taking differently: Inject 5 mg subcutaneously once a week ON HOLD) 2 mL 3  . traMADoL (ULTRAM) 50 mg tablet Take 1 tablet (50 mg total) by mouth 3 (three) times a day for 30 days 90 tablet 0   No current facility-administered medications for this visit.      HPI   CLINICAL SUMMARY:  For the last number weeks patient has had lower abdominal pain, or pelvic pain, initially thought to be obstipation, CT scan done yesterday showing uterine mass with impingement on the distal ureters and metastasis to the lungs.  She just now has a little bit of vaginal bleeding.  No significant bleeding.  In a moderate amount of pain, bowels are moving fairly well  ROS: Review of systems is unremarkable for any active cardiac, respiratory, GI, GU, hematologic, neurologic, dermatologic, HEENT, or psychiatric symptoms except as noted above, 10 systems reviewed.  No fevers, chills, or constitutional symptoms.   PHYSICAL EXAM  Vital signs:  BP (!) 160/100   Pulse 84   Wt (!) 120.4 kg (265 lb 6.4 oz)   SpO2 95%   BMI 47.03 kg/m  Body mass index is 47.03 kg/m.   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  Mother with similar type of malignancy.  Patient in significant pain, tramadol 3 times daily, stat referral to Dr. Mancil, GYN oncology Long discussion with patient, 2-week follow-up, tramadol 3 times daily for pain, labs normal  Dispo:   Return in about 2 weeks (around 09/13/2023) for followup.

## 2023-09-01 DIAGNOSIS — C55 Malignant neoplasm of uterus, part unspecified: Secondary | ICD-10-CM | POA: Insufficient documentation

## 2023-09-01 NOTE — Progress Notes (Signed)
 Gynecologic Oncology New Visit   Referring Provider: Oneil Gardiner Pinal, MD  Chief Concern: uterine mass with concern for cancer with probable lung, peritoneal and nodal metastasis.  Subjective:  Tonya [name] is a 58 y.o. with a past medical history significant for NSTEMI, HTN, T2DM, BMI 47, hypothyroidism who is seen in consultation at the request of Oneil Gardiner Pinal, MD for uterine mass with lung metastasis.  Patient states she was initially was on ozempic and transitioned to monjaro during which she developed worsening constipation and cramping abdominal pain over the last few weeks. Cramping pain has worsened as well as decreased appetite and nausea prompting CT. CT demonstrating uterine mass with impingement on the distal ureters and metastasis to the lungs and lymph nodes.   States pain as worsened, describes it as severe menstrual cramps. Occasionally has vaginal spotting but no vaginal bleeding or discharge. She also endorses two rounds of antibiotics since Thanksgiving, one for a chest infection as well as another for presumed diverticulitis. She endorses weight loss of 10 pounds over the last few weeks, difficulty tolerating PO, belching with drinking. Pain does improve with tramadol.  Last pap smear was over 10 years ago, had an abnormal pap smear in her early 1s, had cryotherapy. States she had not had abnormal pap smears since.   Sister had breast cancer - dx around 19 Mother had stage IV colon cancer - dx around 83  Do any family members have a history of DVT/PE: no   Genetic Risk Assessment: Need for genetic testing will be determined following final pathology results.  Molecular Tumor testing: Testing will be determined following final pathology results.  Problem List: Patient Active Problem List  Diagnosis  . Morbid obesity (CMS/HHS-HCC)  . NSTEMI (non-ST elevated myocardial infarction) (CMS/HHS-HCC)  . Benign essential hypertension  . Acquired hypothyroidism  .  Hyperlipidemia, mixed  . Type 2 diabetes mellitus with peripheral angiopathy (CMS/HHS-HCC)  . Vitamin D deficiency  . B12 deficiency  . Malignant neoplasm of uterus (CMS/HHS-HCC)    Past Medical History: Past Medical History:  Diagnosis Date  . Coronary artery disease   . Diabetes mellitus without complication (CMS/HHS-HCC)   . Hypothyroidism   . Thyroid  disease     Past Surgical History: Past Surgical History:  Procedure Laterality Date  . CORONARY STENT INTERVENTION  07/24/2019   End, Lonni, MD  . LEFT HEART CATH AND CORONARY ANGIOGRAPHY  07/24/2019   Hester Wolm PARAS, MD  . CARDIAC CATHETERIZATION    . CESAREAN SECTION    . TONSILLECTOMY       Past Gynecologic History:   Menopause: around age 26 Last Menstrual Period: 8 years ago  History of OCP/HRT use: no  History of Abnormal pap: yes, s/p cryotherapy in early 51s Last pap: over 10 years ago  HPV vaccine: no  OB History: One pregnancy OB History  No obstetric history on file.   Family History: Family History  Problem Relation Age of Onset  . Myocardial Infarction (Heart attack) Father   . Heart disease Father     Social History: Social History   Socioeconomic History  . Marital status: Married  Tobacco Use  . Smoking status: Never  . Smokeless tobacco: Never  Vaping Use  . Vaping status: Never Used  Substance and Sexual Activity  . Alcohol  use: Not Currently  . Drug use: Never  . Sexual activity: Yes  Social History Narrative   Working full time.   Social Drivers of Corporate Investment Banker  Strain: Low Risk  (07/23/2019)   Received from [facility], [place]   Overall Financial Resource Strain (CARDIA)   . Difficulty of Paying Living Expenses: Not hard at all  Food Insecurity: Unknown (07/23/2019)   Received from [facility], [place]   Hunger Vital Sign   . Worried About Programme Researcher, Broadcasting/film/video in the Last Year: Never true  Transportation Needs: No Transportation Needs  (07/23/2019)   Received from [facility], [place]   [facility] - Transportation   . Lack of Transportation (Medical): No   . Lack of Transportation (Non-Medical): No  Physical Activity: Unknown (07/23/2019)   Received from [facility], [place]   Exercise Vital Sign   . Days of Exercise per Week: 2 days  Stress: No Stress Concern Present (07/23/2019)   Received from [facility], [facility]   [facility] of Occupational [facility] - Occupational Stress Questionnaire   . Feeling of Stress : Not at all  Housing Stability: Unknown (08/29/2023)   Housing Stability Vital Sign   . Homeless in the Last Year: No    Allergies: No Known Allergies  Current Medications: Current Outpatient Medications  Medication Sig Dispense Refill  . ARMOUR THYROID  120 mg tablet Take 1 tablet (120 mg total) by mouth every morning before breakfast (0630) 90 tablet 3  . BD INSULIN  SYRINGE 1 mL 25 x 1 Syrg as directed    . cholecalciferol 1000 unit tablet Take 1,000 Units by mouth once daily ON HOLD    . empagliflozin (JARDIANCE) 10 mg tablet Take 1 tablet (10 mg total) by mouth once daily 90 tablet 3  . losartan  (COZAAR ) 25 MG tablet Take 25 mg by mouth once daily    . multivitamin tablet Take 1 tablet by mouth once daily    . syringe with needle 1 mL 25 gauge x 1 syringe Use as directed for up to 30 days 1 each 11  . traMADoL (ULTRAM) 50 mg tablet Take 1 tablet (50 mg total) by mouth 3 (three) times a day for 30 days 90 tablet 0  . bisacodyL  (DULCOLAX) 10 mg suppository Place 1 suppository (10 mg total) rectally 2 (two) times daily for 10 days (Patient not taking: Reported on 09/01/2023) 12 suppository 0  . cyanocobalamin  (VITAMIN B12) 1,000 mcg/mL injection Inject 1 mL (1,000 mcg total) into the muscle monthly (Patient not taking: Reported on 09/01/2023) 10 mL 2  . ergocalciferol, vitamin D2, 1,250 mcg (50,000 unit) capsule Take 1 capsule (50,000 Units total) by mouth once a week (Patient not taking:  Reported on 09/01/2023) 4 capsule 6  . hyoscyamine (LEVSIN/SL) 0.125 mg SL tablet Place 1 tablet (0.125 mg total) under the tongue every 4 (four) hours as needed for Cramping for up to 10 days (Patient not taking: Reported on 09/01/2023) 30 tablet 0  . oxyCODONE  (ROXICODONE ) 5 MG immediate release tablet Take 1 tablet (5 mg total) by mouth every 4 (four) hours as needed for Pain for up to 5 days 15 tablet 0  . rosuvastatin (CRESTOR) 10 MG tablet Take 1 tablet (10 mg total) by mouth once daily (Patient not taking: Reported on 09/01/2023) 90 tablet 3  . tirzepatide (MOUNJARO) 5 mg/0.5 mL pen injector Inject 0.5 mLs (5 mg total) subcutaneously once a week (Patient not taking: Reported on 09/01/2023) 2 mL 3   No current facility-administered medications for this visit.    Review of Systems As per history, otherwise eview of systems negative.    Objective:  Physical  Examination:  BP (!) 148/86 (BP Location: Left upper arm, Patient Position: Sitting, BP Cuff Size: Large Adult)   Pulse 99   Temp 36.5 C (97.7 F) (Oral)   Resp 18   Wt (!) 119.7 kg (263 lb 14.3 oz)   SpO2 97%   BMI 46.76 kg/m    ECOG Performance Status:1 Strenuous physical activity restricted; fully ambulatory and able to carry out light work  General Appearance / Mental Status: well appearing in no acute distress / normal mood and affect Lymphatic survey: non-palpable supraclavicular, inguinal in the setting of habitus Cardiovascular: regular rate and rhythm Respiratory: normal work of breathing Gastrointestinal: soft, distended, tender to palpation of lower quadrants Musculoskeletal: no lower extremity edema  Pelvic exam:   Exam chaperoned by RN; External genitalia: normal, without lesion and Erythematous rash in bilateral groin Urethra/Bladder: normal  Vagina: narrow vaginal canal tapering at the apex with induration anteriorly and erythematous changes concerning for malignancy. No palpable cervix  Cervix: not palpable due to  vaginal induration Uterus: not palpable in the setting of habitus Adnexa: nontender, no masses  Rectal: confirms bimanual with significant induration of parametria bilaterally.    Lab Review Lab Results  Component Value Date   WBC 8.9 08/29/2023   HGB 16.3 (H) 08/29/2023   HCT 46.6 08/29/2023   PLT 417 08/29/2023   MCV 92.8 08/29/2023   MCH 32.5 (H) 08/29/2023   MCHC 35.0 08/29/2023   RBC 5.02 08/29/2023   MPV 9.9 08/29/2023   RDWCV 13.2 08/29/2023   NA 132 (L) 08/29/2023   K 4.9 08/29/2023   CL 95 (L) 08/29/2023   CO2 28.3 08/29/2023   BUN 11 08/29/2023   CREATININE 0.8 08/29/2023   GLUCOSE 132 (H) 08/29/2023   CALCIUM  9.9 08/29/2023   AST 37 08/29/2023   ALT 10 08/29/2023   ALKPHOS 73 08/29/2023   ALB 4.3 08/29/2023   Lab Results  Component Value Date   NEUTCT 5.83 08/29/2023     Radiologic Imaging: Last study completed 08/29/2023.  I viewed available images personally in clinic. Outside studies were reviewed personally in the clinic. EXAM:  CT ABDOMEN AND PELVIS WITH CONTRAST   TECHNIQUE:  Multidetector CT imaging of the abdomen and pelvis was performed  using the standard protocol following bolus administration of  intravenous contrast.   RADIATION DOSE REDUCTION: This exam was performed according to the  departmental dose-optimization program which includes automated  exposure control, adjustment of the mA and/or kV according to  patient size and/or use of iterative reconstruction technique.   CONTRAST:  OMNIPAQUE  IOHEXOL  300 MG/ML  SOLN   COMPARISON:  None Available.   FINDINGS:  Lower chest: Multiple tiny bilateral pulmonary nodules. Example left  lower lobe at 4 mm on 16/4.   3 mm on the right lower lobe on 20/4.   Retrocrural adenopathy at 1.0 cm on 19/2. Prominent but not  pathologically sized lower mediastinal nodes are incompletely  imaged. Mild cardiomegaly with multivessel coronary artery  calcification.   Hepatobiliary: Mild  caudate and lateral segment left liver lobe  enlargement. Normal gallbladder, without biliary ductal dilatation.   Pancreas: Normal, without mass or ductal dilatation.   Spleen: Normal in size, without focal abnormality.   Adrenals/Urinary Tract: Normal adrenal glands. Mild bilateral  hydronephrosis and proximal hydroureter. The ureters are somewhat  challenging to follow, but no distal hydroureter is seen.   The bladder is mildly thick walled with pericystic edema.   Stomach/Bowel: Normal stomach, without wall thickening. Scattered  colonic diverticula. Normal terminal ileum and appendix. Normal  small bowel.   Vascular/Lymphatic: Advanced aortic and branch vessel  atherosclerosis. Extensive abdominal retroperitoneal adenopathy.  Retrocaval index node measures 1.5 cm at 34/2.   Gastrohepatic ligament node measures 1.5 cm in 25/2.   Pelvic adenopathy with a left external iliac 1.3 cm node on 67/2.   Reproductive: Central uterine enlargement at 1.8 cm on sagittal  image 82/6. Suggestion of soft tissue fullness within the lower  uterine segment and cervix including on 69/2. Periuterine  interstitial thickening.No adnexal mass.   Other: No abdominal ascites or free intraperitoneal air. Omental  nodularity including at 9 mm on 31/2. Peritoneal thickening  including of the left lateral conal fascia on 37/2.   Musculoskeletal: No acute osseous abnormality.   IMPRESSION:  1. Widespread metastatic disease, including to abdominopelvic and  lower thoracic nodes, omentum, and lungs as detailed above.  2. Abnormal appearance of the uterus for age. Favor endometrial or  cervical sites of primary.  3. Mild bilateral hydronephrosis and proximal hydroureter. Given  absence of distal hydroureter, favor involvement by uterine or  cervical primary.  4. Bladder wall thickening and pericystic edema for which cystitis  cannot be excluded.  5. No bowel obstruction or other acute complication   6. Hepatic morphology suspicious for mild cirrhosis.  7. Aortic atherosclerosis (ICD10-I70.0), coronary artery  atherosclerosis and emphysema (ICD10-J43.9).   Assessment:  Tonya [name] is a 58 y.o. diagnosed with uterine mass with metastatic disease to abdominopelvic and lower thoracic nodes, omentum, and lungs as well as bilateral hydronephrosis on imaging. Patient endorses significant lower abdominal pain, constipation, weight loss, and poor PO tolerance. Remote history of abnormal pap smear, last pap > 10 years ago. Exam notable for significant vaginal induration and non-palpable cervix and uterus highly concerning for malignancy, suspect uterine primary in view of morbid obesity, however cannot rule out cervical primary.   Medical co-morbidities complicating care: NSTEMI, HTN, T2DM, BMI 47 Plan:   Problem List Items Addressed This Visit     Benign essential hypertension   Relevant Medications   losartan  (COZAAR ) 25 MG tablet   Type 2 diabetes mellitus with peripheral angiopathy (CMS/HHS-HCC)   Relevant Medications   BD INSULIN  SYRINGE 1 mL 25 x 1 Syrg   losartan  (COZAAR ) 25 MG tablet   Malignant neoplasm of uterus (CMS/HHS-HCC) - Primary   Relevant Medications   oxyCODONE  (ROXICODONE ) 5 MG immediate release tablet   Other Relevant Orders   PAP Test Liquid-Based Screening-High Risk(Predisposing condition)   Pathology - GU   CT abdominal or retroperitoneal biopsy needle   US  guided biopsy   CT chest with contrast with 3D MIPS protocol   Pathology - GU   CT abdominal outside interpretation    We reviewed the available medical records, available laboratory results, and previous radiology studies.  No pathology yet obtained. Imaging findings and exam concerning for malignancy, suspect uterine primary however possibly cervical. Risk factors for endometrial cancer include postmenopausal age and obesity. She also endorses history of abnormal pap requiring cryotherapy, with last pap >  10 years ago. CT A/P at [facility] notable for metastatic disease with pelvic, para-aortic, and thoracic lymphadenopathy, lung and peritoneal involvement, and mild hydronephrosis consistent with stage IV disease.  Biopsies and pap smear were attempted today however limited by vaginal exposure (see procedure note). These were sent STAT. Due to possibility of not obtaining adequate tissue diagnosis and for molecular tumor testing for HER2, TP53 and MMR IHC, image-guided biopsy was  also recommended and ordered. Outside interpretation of CT A/P ordered, as well as dedicated chest imaging to evaluate for lung metastasis.   We discussed our suspicion for metastatic cancer and that our first approach would be systemic chemotherapy. If uterine primary we discussed this would be a combination of carboplatin  and paclitaxel, as well as further molecular markers to guide targeted treatment (MSI testing, HER2). We reviewed interval imaging after 3 cycles of chemotherapy to assess disease response. We also discussed possibility of surgery to reduce tumor burden in the future if responsive to chemotherapy.  Patient would like to have chemotherapy at [facility] and can arrange this after tissue diagnosis confirmed.    Will give her oxycodone  5 mg #15 in view of significant pain.    The diagnosis, an outline of the further diagnostic and laboratory studies which will be required, the recommendation, and alternatives were discussed.  All questions were answered to the patients satisfaction and they are comfortable with the plan.   RANDALL HENRY, MD  Attestation Statement:   I personally saw and evaluated the patient, and participated in the management and treatment plan as documented in the resident/fellow note.  PRENTICE AGENT, MD  CC:  Oneil Gardiner Pinal, MD  [phone] [facility] [address] [facility] / [phone]

## 2023-09-19 ENCOUNTER — Other Ambulatory Visit: Payer: Self-pay

## 2023-09-19 DIAGNOSIS — C799 Secondary malignant neoplasm of unspecified site: Secondary | ICD-10-CM

## 2023-09-19 DIAGNOSIS — C55 Malignant neoplasm of uterus, part unspecified: Secondary | ICD-10-CM

## 2023-09-20 ENCOUNTER — Other Ambulatory Visit: Payer: Self-pay

## 2023-09-20 DIAGNOSIS — C55 Malignant neoplasm of uterus, part unspecified: Secondary | ICD-10-CM

## 2023-09-21 ENCOUNTER — Inpatient Hospital Stay: Payer: BC Managed Care – PPO | Attending: Oncology | Admitting: Oncology

## 2023-09-21 ENCOUNTER — Inpatient Hospital Stay: Payer: BC Managed Care – PPO

## 2023-09-21 ENCOUNTER — Other Ambulatory Visit: Payer: Self-pay

## 2023-09-21 ENCOUNTER — Encounter: Payer: Self-pay | Admitting: Oncology

## 2023-09-21 VITALS — BP 128/73 | HR 96 | Temp 96.8°F | Resp 18 | Wt 242.0 lb

## 2023-09-21 DIAGNOSIS — I2510 Atherosclerotic heart disease of native coronary artery without angina pectoris: Secondary | ICD-10-CM | POA: Diagnosis not present

## 2023-09-21 DIAGNOSIS — I10 Essential (primary) hypertension: Secondary | ICD-10-CM | POA: Diagnosis not present

## 2023-09-21 DIAGNOSIS — R109 Unspecified abdominal pain: Secondary | ICD-10-CM | POA: Diagnosis not present

## 2023-09-21 DIAGNOSIS — C7A1 Malignant poorly differentiated neuroendocrine tumors: Secondary | ICD-10-CM | POA: Insufficient documentation

## 2023-09-21 DIAGNOSIS — C7B8 Other secondary neuroendocrine tumors: Secondary | ICD-10-CM | POA: Insufficient documentation

## 2023-09-21 DIAGNOSIS — I700 Atherosclerosis of aorta: Secondary | ICD-10-CM | POA: Insufficient documentation

## 2023-09-21 DIAGNOSIS — Z7189 Other specified counseling: Secondary | ICD-10-CM

## 2023-09-21 DIAGNOSIS — K5900 Constipation, unspecified: Secondary | ICD-10-CM | POA: Insufficient documentation

## 2023-09-21 DIAGNOSIS — N1330 Unspecified hydronephrosis: Secondary | ICD-10-CM | POA: Diagnosis not present

## 2023-09-21 DIAGNOSIS — G893 Neoplasm related pain (acute) (chronic): Secondary | ICD-10-CM | POA: Diagnosis not present

## 2023-09-21 DIAGNOSIS — C541 Malignant neoplasm of endometrium: Secondary | ICD-10-CM | POA: Diagnosis not present

## 2023-09-21 DIAGNOSIS — C7A8 Other malignant neuroendocrine tumors: Secondary | ICD-10-CM | POA: Diagnosis not present

## 2023-09-21 DIAGNOSIS — E039 Hypothyroidism, unspecified: Secondary | ICD-10-CM | POA: Diagnosis not present

## 2023-09-21 DIAGNOSIS — K5730 Diverticulosis of large intestine without perforation or abscess without bleeding: Secondary | ICD-10-CM | POA: Diagnosis not present

## 2023-09-21 DIAGNOSIS — Z9089 Acquired absence of other organs: Secondary | ICD-10-CM | POA: Diagnosis not present

## 2023-09-21 DIAGNOSIS — Z8249 Family history of ischemic heart disease and other diseases of the circulatory system: Secondary | ICD-10-CM | POA: Diagnosis not present

## 2023-09-21 DIAGNOSIS — J439 Emphysema, unspecified: Secondary | ICD-10-CM | POA: Insufficient documentation

## 2023-09-21 DIAGNOSIS — R590 Localized enlarged lymph nodes: Secondary | ICD-10-CM | POA: Insufficient documentation

## 2023-09-21 DIAGNOSIS — R609 Edema, unspecified: Secondary | ICD-10-CM | POA: Insufficient documentation

## 2023-09-21 DIAGNOSIS — Z79899 Other long term (current) drug therapy: Secondary | ICD-10-CM | POA: Insufficient documentation

## 2023-09-21 DIAGNOSIS — I252 Old myocardial infarction: Secondary | ICD-10-CM | POA: Insufficient documentation

## 2023-09-21 DIAGNOSIS — Z7985 Long-term (current) use of injectable non-insulin antidiabetic drugs: Secondary | ICD-10-CM | POA: Insufficient documentation

## 2023-09-21 MED ORDER — PROCHLORPERAZINE MALEATE 10 MG PO TABS: 10.0000 mg | ORAL_TABLET | Freq: Four times a day (QID) | ORAL | 1 refills | Status: DC | PRN

## 2023-09-21 MED ORDER — LIDOCAINE-PRILOCAINE 2.5-2.5 % EX CREA: TOPICAL_CREAM | CUTANEOUS | 3 refills | Status: DC

## 2023-09-21 MED ORDER — ONDANSETRON HCL 8 MG PO TABS: 8.0000 mg | ORAL_TABLET | Freq: Three times a day (TID) | ORAL | 1 refills | Status: DC | PRN

## 2023-09-21 MED ORDER — DEXAMETHASONE 4 MG PO TABS: ORAL_TABLET | ORAL | 0 refills | Status: DC

## 2023-09-21 NOTE — Progress Notes (Signed)
Pharmacist Chemotherapy Monitoring - Initial Assessment    Anticipated start date: 09/26/23  The following has been reviewed per standard work regarding the patient's treatment regimen: The patient's diagnosis, treatment plan and drug doses, and organ/hematologic function Lab orders and baseline tests specific to treatment regimen  The treatment plan start date, drug sequencing, and pre-medications Prior authorization status  Patient's documented medication list, including drug-drug interaction screen and prescriptions for anti-emetics and supportive care specific to the treatment regimen The drug concentrations, fluid compatibility, administration routes, and timing of the medications to be used The patient's access for treatment and lifetime cumulative dose history, if applicable  The patient's medication allergies and previous infusion related reactions, if applicable   Changes made to treatment plan:  N/A  Follow up needed:  Pending authorization for treatment   Sharen Hones, PharmD, BCPS Clinical Pharmacist   09/21/2023  1:13 PM

## 2023-09-21 NOTE — Progress Notes (Signed)
Met with Ms. Lenna Gilford. Introduced Visual merchandiser and provided contact information for future needs. Chemo class arranged. PET pending. [facility] contacted to cancel chest CT today as she currently in clinic. Reviewed port instructions and provided in AVS.

## 2023-09-21 NOTE — Progress Notes (Signed)
START OFF PATHWAY REGIMEN - Other   [id number]:Atezolizumab D1 + Carboplatin D1 + Etoposide  IV D1-3 q21 Days x 4 Cycles Followed by Lacretia Nicks D1 q21 Days:   Cycles 1 through 4, every 21 days:     Atezolizumab      Carboplatin      Etoposide    Cycles 5 and beyond, every 21 days:     Atezolizumab   **Always confirm dose/schedule in your pharmacy ordering system**  Patient Characteristics: Intent of Therapy: Non-Curative / Palliative Intent, Discussed with Patient

## 2023-09-21 NOTE — Progress Notes (Signed)
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 Johnnette Litter  Date of visit: 09/21/23   History of presenting illness- Patient is a 58 year old female with a past medical history significant for hypertension type 2 diabetes hypothyroidism who presented with symptoms of worsening constipation and abdominal pain since December 2024.  This was followed by a CT abdomen pelvis with contrast which showed widespread metastatic disease to abdominopelvic and thoracic nodes, omentum and lungs.  Abnormal appearance of the uterus and lower uterine segment and cervix favoring endometrial or cervical primary.  Patient was seen by Dr. Johnnette Litter and underwent endometrial and cervical biopsy as well as biopsy of the retroperitoneal lymph node.  Cervical biopsy was consistent with squamous mucosa and abundant necrotic debris highly suspicious but not definitively diagnostic for malignancy.  FNA of the retroperitoneal lymph node showed poorly differentiated carcinoma with neuroendocrine features.  Comment: The following immunohistochemistry was performed after review of the clinical history and morphology to further characterize the pathologic process. The results are as follows:   A1-14  PanCK                      positive  A1-15  P16                           Positive, patchy A1-16  P53                           Normal/wild-type  A1-17  PAX8                        Focal blush positive  A1-18  ER Dx Only              negative  A1-19  Bio Neg Ctrl               Appropriately reactive control slide  A1-22  CK7                          Positive, patchy A1-23  CK20                        negative  A1-24  GATA 3                    negative  A1-25  CDX-2                      Positive, patchy A1-26  TTF-1                        negative  A1-27  INSM-1                     Positive, patchy weak A1-28SynaptophysinPositive, patchy weak A1-31  BRG-1                      Favor retained (weak) nuclear staining A1-32  BAF 47                     Retained nuclear staining A1-33  P63                           negative  A1-34  Claudin-4                  Positive, patchy    Smears and cell block show a poorly differentiated carcinoma. The immunophenotype is non-specific, but shows evidence of neuroendocrine differentiation.   Family history significant for stage IV colon cancer in her mother in her 71s.  Sister had breast cancer in her 25s.  ECOG PS- 2  Pain scale- 9   Review of systems- Review of Systems  Constitutional:  Negative for chills, fever, malaise/fatigue and weight loss.  HENT:  Negative for congestion, ear discharge and nosebleeds.   Eyes:  Negative for blurred vision.  Respiratory:  Negative for cough, hemoptysis, sputum production, shortness of breath and wheezing.   Cardiovascular:  Negative for chest pain, palpitations, orthopnea and claudication.  Gastrointestinal:  Positive for abdominal pain, constipation and nausea. Negative for blood in stool, diarrhea, heartburn, melena and vomiting.  Genitourinary:  Negative for dysuria, flank pain, frequency, hematuria and urgency.  Musculoskeletal:  Negative for back pain, joint pain and myalgias.  Skin:  Negative for rash.  Neurological:  Negative for dizziness, tingling, focal weakness, seizures, weakness and headaches.  Endo/Heme/Allergies:  Does not bruise/bleed easily.  Psychiatric/Behavioral:  Negative for depression and suicidal ideas. The patient does not have insomnia.     No  Known Allergies  Patient Active Problem List   Diagnosis Date Noted   Coronary artery disease involving native coronary artery of native heart with angina pectoris (HCC) 07/24/2022   Ischemic cardiomyopathy 08/09/2019   Essential hypertension 08/09/2019   Hyperlipidemia associated with type 2 diabetes mellitus (HCC) 08/09/2019   Morbid obesity (HCC) 08/09/2019   Diabetes mellitus (HCC) 08/09/2019   NSTEMI (non-ST elevated myocardial infarction) (HCC) 07/23/2019   Hypothyroidism (acquired) 07/23/2019     Past Medical History:  Diagnosis Date   Coronary artery disease    Hypothyroidism    Thyroid disease      Past Surgical History:  Procedure Laterality Date   CARDIAC CATHETERIZATION     CESAREAN SECTION     CORONARY STENT INTERVENTION N/A 07/24/2019   Procedure: CORONARY STENT INTERVENTION;  Surgeon: Yvonne Kendall, MD;  Location: [facility] INVASIVE CV LAB;  Service: Cardiovascular;  Laterality: N/A;  RCA & CFX   LEFT HEART CATH AND CORONARY ANGIOGRAPHY N/A 07/24/2019   Procedure: LEFT HEART CATH AND CORONARY ANGIOGRAPHY;  Surgeon: Lamar Blinks, MD;  Location: [facility] INVASIVE CV LAB;  Service: Cardiovascular;  Laterality: N/A;   TONSILLECTOMY      Social History   Socioeconomic History   Marital status: Married    Spouse name: Not on file   Number of children: Not on file   Years of education: Not on file   Highest education level: Not on file  Occupational History   Not on file  Tobacco Use   Smoking status: Never   Smokeless tobacco: Never  Vaping Use   Vaping status: Never Used  Substance and Sexual Activity   Alcohol use: Not Currently    Comment: occ   Drug use: Never   Sexual activity: Not on file  Other Topics Concern   Not on file  Social  History Narrative   Not on file   Social Drivers of Health   Financial Resource Strain: Low Risk  (07/23/2019)   Overall Financial Resource Strain (CARDIA)    Difficulty of Paying Living Expenses: Not hard at all   Food Insecurity: Unknown (07/23/2019)   Hunger Vital Sign    Worried About Running Out of Food in the Last Year: Never true    Ran Out of Food in the Last Year: Not on file  Transportation Needs: No Transportation Needs (07/23/2019)   PRAPARE - Administrator, Civil Service (Medical): No    Lack of Transportation (Non-Medical): No  Physical Activity: Unknown (07/23/2019)   Exercise Vital Sign    Days of Exercise per Week: 2 days    Minutes of Exercise per Session: Not on file  Stress: No Stress Concern Present (07/23/2019)   [employer] of Occupational Health - Occupational Stress Questionnaire    Feeling of Stress : Not at all  Social Connections: Not on file  Intimate Partner Violence: Not on file     Family History  Problem Relation Age of Onset   Atrial fibrillation Mother    Heart Problems Father    Heart attack Father    Heart disease Father      Current Outpatient Medications:    bisacodyl (DULCOLAX) 10 MG suppository, Place rectally., Disp: , Rfl:    Cholecalciferol (VITAMIN D-1000 MAX ST) 25 MCG (1000 UT) tablet, Take by mouth., Disp: , Rfl:    oxyCODONE (OXY IR/ROXICODONE) 5 MG immediate release tablet, Take by mouth., Disp: , Rfl:    traMADol (ULTRAM) 50 MG tablet, Take 50 mg by mouth 3 (three) times daily as needed., Disp: , Rfl:    aspirin EC 81 MG tablet, Take 81 mg by mouth daily., Disp: , Rfl:    JARDIANCE 10 MG TABS tablet, Take 10 mg by mouth daily., Disp: , Rfl:    losartan (COZAAR) 25 MG tablet, Take 1 tablet (25 mg total) by mouth daily., Disp: 90 tablet, Rfl: 3   Multiple Vitamin (MULTI-VITAMIN) tablet, Take 1 tablet by mouth daily., Disp: , Rfl:    nitroGLYCERIN (NITROSTAT) 0.4 MG SL tablet, Place 1 tablet (0.4 mg total) under the tongue every 5 (five) minutes as needed for chest pain., Disp: 25 tablet, Rfl: 3   OZEMPIC, 0.25 OR 0.5 MG/DOSE, 2 MG/3ML SOPN, Inject 0.5 mg into the skin once a week., Disp: , Rfl:    rosuvastatin (CRESTOR)  10 MG tablet, Take 10 mg by mouth daily., Disp: , Rfl:    thyroid (ARMOUR) 120 MG tablet, Take 120 mg by mouth daily before breakfast., Disp: , Rfl:    Physical exam: There were no vitals filed for this visit. Physical Exam Cardiovascular:     Rate and Rhythm: Normal rate and regular rhythm.     Heart sounds: Normal heart sounds.  Pulmonary:     Effort: Pulmonary effort is normal.     Breath sounds: Normal breath sounds.  Abdominal:     General: Bowel sounds are normal. There is no distension.     Palpations: Abdomen is soft.     Tenderness: There is abdominal tenderness.  Skin:    General: Skin is warm and dry.  Neurological:     Mental Status: She is alert and oriented to person, place, and time.           Latest Ref Rng & Units 07/25/2019    5:30 AM  CMP  Glucose  70 - 99 mg/dL 811   BUN 6 - 20 mg/dL 9   Creatinine 9.14 - 7.82 mg/dL 9.56   Sodium 213 - 086 mmol/L 137   Potassium 3.5 - 5.1 mmol/L 3.6   Chloride 98 - 111 mmol/L 104   CO2 22 - 32 mmol/L 24   Calcium 8.9 - 10.3 mg/dL 9.1       Latest Ref Rng & Units 07/25/2019    5:30 AM  CBC  WBC 4.0 - 10.5 K/uL 8.2   Hemoglobin 12.0 - 15.0 g/dL 57.8   Hematocrit 46.9 - 46.0 % 38.6   Platelets 150 - 400 K/uL 241     No images are attached to the encounter.  CT ABDOMEN PELVIS W CONTRAST Result Date: 08/29/2023 CLINICAL DATA:  Abdominal pain for several weeks. 9 pound weight loss. * Tracking Code: BO * EXAM: CT ABDOMEN AND PELVIS WITH CONTRAST TECHNIQUE: Multidetector CT imaging of the abdomen and pelvis was performed using the standard protocol following bolus administration of intravenous contrast. RADIATION DOSE REDUCTION: This exam was performed according to the departmental dose-optimization program which includes automated exposure control, adjustment of the mA and/or kV according to patient size and/or use of iterative reconstruction technique. CONTRAST:  OMNIPAQUE IOHEXOL 300 MG/ML  SOLN COMPARISON:  None  Available. FINDINGS: Lower chest: Multiple tiny bilateral pulmonary nodules. Example left lower lobe at 4 mm on 16/4. 3 mm on the right lower lobe on 20/4. Retrocrural adenopathy at 1.0 cm on 19/2. Prominent but not pathologically sized lower mediastinal nodes are incompletely imaged. Mild cardiomegaly with multivessel coronary artery calcification. Hepatobiliary: Mild caudate and lateral segment left liver lobe enlargement. Normal gallbladder, without biliary ductal dilatation. Pancreas: Normal, without mass or ductal dilatation. Spleen: Normal in size, without focal abnormality. Adrenals/Urinary Tract: Normal adrenal glands. Mild bilateral hydronephrosis and proximal hydroureter. The ureters are somewhat challenging to follow, but no distal hydroureter is seen. The bladder is mildly thick walled with pericystic edema. Stomach/Bowel: Normal stomach, without wall thickening. Scattered colonic diverticula. Normal terminal ileum and appendix. Normal small bowel. Vascular/Lymphatic: Advanced aortic and branch vessel atherosclerosis. Extensive abdominal retroperitoneal adenopathy. Retrocaval index node measures 1.5 cm at 34/2. Gastrohepatic ligament node measures 1.5 cm in 25/2. Pelvic adenopathy with a left external iliac 1.3 cm node on 67/2. Reproductive: Central uterine enlargement at 1.8 cm on sagittal image 82/6. Suggestion of soft tissue fullness within the lower uterine segment and cervix including on 69/2. Periuterine interstitial thickening.No adnexal mass. Other: No abdominal ascites or free intraperitoneal air. Omental nodularity including at 9 mm on 31/2. Peritoneal thickening including of the left lateral conal fascia on 37/2. Musculoskeletal: No acute osseous abnormality. IMPRESSION: 1. Widespread metastatic disease, including to abdominopelvic and lower thoracic nodes, omentum, and lungs as detailed above. 2. Abnormal appearance of the uterus for age. Favor endometrial or cervical sites of primary. 3.  Mild bilateral hydronephrosis and proximal hydroureter. Given absence of distal hydroureter, favor involvement by uterine or cervical primary. 4. Bladder wall thickening and pericystic edema for which cystitis cannot be excluded. 5. No bowel obstruction or other acute complication 6. Hepatic morphology suspicious for mild cirrhosis. 7. Aortic atherosclerosis (ICD10-I70.0), coronary artery atherosclerosis and emphysema (ICD10-J43.9). These results will be called to the ordering clinician or representative by the Radiologist Assistant, and communication documented in the PACS or [employer]. Electronically Signed   By: Jeronimo Greaves M.D.   On: 08/29/2023 17:50    Assessment and plan- Patient is a 58 y.o. 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  Cervical/endometrial biopsy was highly suspicious for malignancy but nondiagnostic.  Retroperitoneal lymph node biopsy however was consistent with poorly differentiated carcinoma with evidence of neuroendocrine differentiation.  Tumor cells were positive for CK and weakly positive for synaptophysin.  I recommend first-line treatment for her like we would treat neuroendocrine carcinomas.  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  I recommend carboplatin AUC 5 along with etoposide and Tecentriq on day 1 and etoposide alone on day 2 and day 3.  Treatment will be given IV every 3 weeks for 4-6 cycles followed by  maintenance Tecentriq.  Will plan to get baseline PET scan and then again repeat imaging after 2 cycles of treatment.  Treatment will be given with a palliative intent  Discussed risks and benefits of chemotherapy including all but not limited to nausea vomiting low blood counts risk of infections and hospitalizations.  Risk of infusion reaction associated with carboplatin.  Risk of immunotherapy associated side effects including all but not limited to autoimmune side effect such as pneumonitis colitis skin rash hepatitis, hypothyroidism and other endocrinopathies.  Treatment will be given with a palliative intent.  Patient understands and agrees to proceed as planned.  Plan is for PET scan, port placement and chemo teach and starting chemotherapy within 1 week.  Neoplasm related pain: I am referring him to palliative care as well for pain management as well as discussion of overall goals of care.  She will continue with as needed oxycodone for now.  Given her concerns for constipation we may have to include nonnarcotic medications such as gabapentin or Lyrica down the line.   Cancer Staging  Endometrial carcinoma [facility]) Staging form: Corpus Uteri - Carcinoma and Carcinosarcoma, AJCC 8th Edition and FIGO 2023 - Clinical stage from 09/21/2023: FIGO Stage IVC, calculated as Stage IVB (cN2a, pM1) - Signed by Creig Hines, MD on 09/21/2023 Histologic grade (G): G3 Histologic grading system: 3 grade system     Thank you for this kind referral and the opportunity to participate in the care of this patient   Visit Diagnosis 1. 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 Goals of care, counseling/discussion   3. Neoplasm related pain     Dr. Owens Shark, MD, MPH [facility] at [facility] [phone] 09/21/2023

## 2023-09-21 NOTE — Patient Instructions (Signed)
Port placement is scheduled for 09/23/2023  Arrive at 12:30  for 1:30 appointment Come into the Heart and Vascular entrance at [facility]. This entrance is located in the front of the hospital. Do not eat or drink anything after midnight Take your regularly scheduled blood pressure, pain, or seizure medication You will need a driver for this procedure

## 2023-09-22 ENCOUNTER — Other Ambulatory Visit: Payer: Self-pay | Admitting: Student

## 2023-09-22 ENCOUNTER — Inpatient Hospital Stay: Payer: BC Managed Care – PPO

## 2023-09-22 ENCOUNTER — Encounter: Payer: Self-pay | Admitting: Oncology

## 2023-09-22 ENCOUNTER — Other Ambulatory Visit: Payer: Self-pay

## 2023-09-22 DIAGNOSIS — C7A8 Other malignant neuroendocrine tumors: Secondary | ICD-10-CM

## 2023-09-22 NOTE — Progress Notes (Signed)
Patient for IR Port Placement on Friday 09/23/2023, I called and spoke with the patient on the phone and gave pre-procedure instructions. Pt was made aware to be here at 12:30p, NPO after MN prior to procedure as well as driver post procedure/recovery/discharge. Pt stated understanding.  Called 09/22/2023

## 2023-09-23 ENCOUNTER — Encounter: Payer: Self-pay | Admitting: Radiology

## 2023-09-23 ENCOUNTER — Encounter: Payer: Self-pay | Admitting: Oncology

## 2023-09-23 ENCOUNTER — Ambulatory Visit
Admission: RE | Admit: 2023-09-23 | Discharge: 2023-09-23 | Disposition: A | Discharge status: Home / Self Care | Payer: BC Managed Care – PPO | Source: Ambulatory Visit | Attending: Oncology | Admitting: Radiology

## 2023-09-23 DIAGNOSIS — I10 Essential (primary) hypertension: Secondary | ICD-10-CM | POA: Insufficient documentation

## 2023-09-23 DIAGNOSIS — C7A8 Other malignant neuroendocrine tumors: Secondary | ICD-10-CM

## 2023-09-23 DIAGNOSIS — C55 Malignant neoplasm of uterus, part unspecified: Secondary | ICD-10-CM | POA: Insufficient documentation

## 2023-09-23 DIAGNOSIS — C799 Secondary malignant neoplasm of unspecified site: Secondary | ICD-10-CM | POA: Insufficient documentation

## 2023-09-23 DIAGNOSIS — I2510 Atherosclerotic heart disease of native coronary artery without angina pectoris: Secondary | ICD-10-CM | POA: Insufficient documentation

## 2023-09-23 DIAGNOSIS — K219 Gastro-esophageal reflux disease without esophagitis: Secondary | ICD-10-CM | POA: Diagnosis not present

## 2023-09-23 DIAGNOSIS — C541 Malignant neoplasm of endometrium: Secondary | ICD-10-CM | POA: Diagnosis present

## 2023-09-23 DIAGNOSIS — I252 Old myocardial infarction: Secondary | ICD-10-CM | POA: Insufficient documentation

## 2023-09-23 HISTORY — PX: IR IMAGING GUIDED PORT INSERTION: IMG5740

## 2023-09-23 MED ORDER — MIDAZOLAM HCL 2 MG/2ML IJ SOLN
INTRAMUSCULAR | Status: AC
  Filled 2023-09-23: qty 2

## 2023-09-23 MED ORDER — FENTANYL CITRATE (PF) 100 MCG/2ML IJ SOLN
INTRAMUSCULAR | Status: AC | PRN
  Administered 2023-09-23: 25 ug via INTRAVENOUS
  Administered 2023-09-23: 50 ug via INTRAVENOUS
  Administered 2023-09-23: 25 ug via INTRAVENOUS

## 2023-09-23 MED ORDER — LIDOCAINE-EPINEPHRINE 1 %-1:100000 IJ SOLN
INTRAMUSCULAR | Status: AC
  Filled 2023-09-23: qty 1

## 2023-09-23 MED ORDER — MIDAZOLAM HCL 2 MG/2ML IJ SOLN
INTRAMUSCULAR | Status: AC | PRN
  Administered 2023-09-23: 1 mg via INTRAVENOUS

## 2023-09-23 MED ORDER — HEPARIN SOD (PORK) LOCK FLUSH 100 UNIT/ML IV SOLN
500.0000 [IU] | Freq: Once | INTRAVENOUS | Status: AC
  Administered 2023-09-23: 500 [IU] via INTRAVENOUS

## 2023-09-23 MED ORDER — HEPARIN SOD (PORK) LOCK FLUSH 100 UNIT/ML IV SOLN
INTRAVENOUS | Status: AC
  Filled 2023-09-23: qty 5

## 2023-09-23 MED ORDER — DIPHENHYDRAMINE HCL 50 MG/ML IJ SOLN
INTRAMUSCULAR | Status: AC | PRN
  Administered 2023-09-23: 25 mg via INTRAVENOUS

## 2023-09-23 MED ORDER — LIDOCAINE-EPINEPHRINE 1 %-1:100000 IJ SOLN
20.0000 mL | Freq: Once | INTRAMUSCULAR | Status: AC
  Administered 2023-09-23: 20 mL via INTRADERMAL

## 2023-09-23 MED ORDER — FENTANYL CITRATE (PF) 100 MCG/2ML IJ SOLN
INTRAMUSCULAR | Status: AC
  Filled 2023-09-23: qty 2

## 2023-09-23 MED ORDER — DIPHENHYDRAMINE HCL 50 MG/ML IJ SOLN
INTRAMUSCULAR | Status: AC
  Filled 2023-09-23: qty 1

## 2023-09-23 MED FILL — Fosaprepitant Dimeglumine For IV Infusion 150 MG (Base Eq): INTRAVENOUS | Qty: 5 | Status: AC

## 2023-09-23 NOTE — H&P (Signed)
Chief Complaint: Patient was seen in consultation today for endometrial cancer  Referring Physician(s): Rao,Archana C  Supervising Physician: Roanna Banning  Patient Status: [facility] - Out-pt  History of Present Illness: Tonya [name] is a 58 y.o. 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 She has plans for upcoming chemotherapy.  Request made for Port-A-Cath placement.   Patient presents to [facility] Radiology today for procedure in her usual state of health.  She has been NPO. She does not take blood thinners.  She is aware of the goals of the procedure today and is agreeable to proceed.   Past Medical History:  Diagnosis Date   Coronary artery disease    GERD (gastroesophageal reflux disease)    Hypertension    Hypothyroidism    Myocardial infarction (HCC)    Thyroid disease     Past Surgical History:  Procedure Laterality Date   CARDIAC CATHETERIZATION     CESAREAN SECTION     CORONARY STENT INTERVENTION N/A 07/24/2019   Procedure: CORONARY STENT INTERVENTION;  Surgeon: Yvonne Kendall, MD;  Location: [facility] INVASIVE CV LAB;  Service: Cardiovascular;  Laterality: N/A;  RCA & CFX   LEFT HEART CATH AND CORONARY ANGIOGRAPHY N/A 07/24/2019   Procedure: LEFT HEART CATH AND CORONARY ANGIOGRAPHY;  Surgeon: Lamar Blinks, MD;  Location: [facility] INVASIVE CV LAB;  Service: Cardiovascular;  Laterality: N/A;   TONSILLECTOMY      Allergies: Patient has no known allergies.  Medications: Prior to Admission medications   Medication Sig Start Date End Date Taking? Authorizing Provider  aspirin EC 81 MG tablet Take 81 mg by mouth daily. Patient not taking: Reported on 09/21/2023 05/18/22   [provider]  bisacodyl (DULCOLAX) 10 MG suppository Place rectally. 09/19/23 10/09/23  [provider]   Cholecalciferol (VITAMIN D-1000 MAX ST) 25 MCG (1000 UT) tablet Take by mouth. 05/25/23   [provider]  dexamethasone (DECADRON) 4 MG tablet Take 2 tabs by mouth starting day after last dose of etoposide for one day only. Repeat every 21 days. Take with food. 09/21/23   Creig Hines, MD  JARDIANCE 10 MG TABS tablet Take 10 mg by mouth daily. Patient not taking: Reported on 09/21/2023    [provider]  lidocaine-prilocaine (EMLA) cream Apply to affected area once 09/21/23   Creig Hines, MD  losartan (COZAAR) 25 MG tablet Take 1 tablet (25 mg total) by mouth daily. 07/23/22 09/21/23  End, Cristal Deer, MD  Multiple Vitamin (MULTI-VITAMIN) tablet Take 1 tablet by mouth daily.    [provider]  nitroGLYCERIN (NITROSTAT) 0.4 MG SL tablet Place 1 tablet (0.4 mg total) under the tongue every 5 (five) minutes as needed for chest pain. Patient not taking: Reported on 09/21/2023 07/23/22 10/21/22  End, Cristal Deer, MD  ondansetron (ZOFRAN) 8 MG tablet Take 1 tablet (8 mg total) by mouth every 8 (eight) hours as needed for nausea, vomiting or refractory nausea / vomiting. Start on the third day after carboplatin. 09/21/23   Creig Hines, MD  oxyCODONE (OXY IR/ROXICODONE) 5 MG immediate release tablet Take by mouth. 09/19/23 10/19/23  [provider]  OZEMPIC, 0.25 OR 0.5 MG/DOSE, 2 MG/3ML SOPN Inject 0.5 mg into the skin once a week. Patient not taking: Reported on 09/21/2023    [provider]  prochlorperazine (COMPAZINE) 10 MG tablet Take 1 tablet (10 mg total) by mouth  every 6 (six) hours as needed for nausea or vomiting. 09/21/23   Creig Hines, MD  rosuvastatin (CRESTOR) 10 MG tablet Take 10 mg by mouth daily. Patient not taking: Reported on 09/21/2023 05/18/22 05/18/23  [provider]  thyroid (ARMOUR) 120 MG tablet Take 120 mg by mouth daily before breakfast.    [provider]  traMADol (ULTRAM) 50 MG tablet Take 50 mg by mouth 3 (three)  times daily as needed. 09/11/23   [provider]     Family History  Problem Relation Age of Onset   Atrial fibrillation Mother        Passed January 2024.   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[employer])  Tobacco Use   Smoking status: Never   Smokeless tobacco: Never  Vaping Use   Vaping status: Never Used  Substance and Sexual Activity   Alcohol use: Not Currently    Comment: occ   Drug use: Never   Sexual activity: Not Currently  Other Topics Concern   Not on file  Social History Narrative   Not on file   Social Drivers of Health   Financial Resource Strain: Low Risk  (07/23/2019)   Overall Financial Resource Strain (CARDIA)    Difficulty of Paying Living Expenses: Not hard at all  Food Insecurity: No Food Insecurity (09/21/2023)   Hunger Vital Sign    Worried About Running Out of Food in the Last Year: Never true    Ran Out of Food in the Last Year: Never true  Transportation Needs: No Transportation Needs (09/21/2023)   PRAPARE - Administrator, Civil Service (Medical): No    Lack of Transportation (Non-Medical): No  Physical Activity: Unknown (07/23/2019)   Exercise Vital Sign    Days of Exercise per Week: 2 days    Minutes of Exercise per Session: Not on file  Stress: No Stress Concern Present (07/23/2019)   [employer] of Occupational Health - Occupational Stress Questionnaire    Feeling of Stress : Not at all  Social Connections: Not on file     Review of Systems: A 12 point ROS discussed and pertinent positives are indicated in the HPI above.  All other systems are negative.  Review of Systems   Constitutional:  Negative for fatigue and fever.  Respiratory:  Negative for cough and shortness of breath.   Cardiovascular:  Negative for chest pain.  Gastrointestinal:  Negative for abdominal pain.  Musculoskeletal:  Negative for back pain.  Psychiatric/Behavioral:  Negative for behavioral problems and confusion.     Vital Signs: BP 126/75   Pulse 96   Temp 98 F (36.7 C) (Oral)   Resp 20   Ht 5\' 4"  (1.626 m)   Wt 250 lb (113.4 kg)   LMP 07/22/2016   SpO2 94%   BMI 42.91 kg/m   Physical Exam Vitals and nursing note reviewed.  Constitutional:      General: She is not in acute distress.    Appearance: Normal appearance. She is not ill-appearing.  HENT:  Mouth/Throat:     Mouth: Mucous membranes are moist.     Pharynx: Oropharynx is clear.  Cardiovascular:     Rate and Rhythm: Normal rate and regular rhythm.  Pulmonary:     Effort: Pulmonary effort is normal. No respiratory distress.     Breath sounds: Normal breath sounds.  Abdominal:     General: Abdomen is flat. There is no distension.     Palpations: Abdomen is soft.  Skin:    General: Skin is warm and dry.  Neurological:     Mental Status: She is alert.      MD Evaluation Airway: WNL Heart: WNL Abdomen: WNL Chest/ Lungs: WNL ASA  Classification: 3 Mallampati/Airway Score: Two   Imaging: CT ABDOMEN PELVIS W CONTRAST Result Date: 08/29/2023 CLINICAL DATA:  Abdominal pain for several weeks. 9 pound weight loss. * Tracking Code: BO * EXAM: CT ABDOMEN AND PELVIS WITH CONTRAST TECHNIQUE: Multidetector CT imaging of the abdomen and pelvis was performed using the standard protocol following bolus administration of intravenous contrast. RADIATION DOSE REDUCTION: This exam was performed according to the departmental dose-optimization program which includes automated exposure control, adjustment of the mA and/or kV according to patient size and/or use of iterative reconstruction technique. CONTRAST:   OMNIPAQUE IOHEXOL 300 MG/ML  SOLN COMPARISON:  None Available. FINDINGS: Lower chest: Multiple tiny bilateral pulmonary nodules. Example left lower lobe at 4 mm on 16/4. 3 mm on the right lower lobe on 20/4. Retrocrural adenopathy at 1.0 cm on 19/2. Prominent but not pathologically sized lower mediastinal nodes are incompletely imaged. Mild cardiomegaly with multivessel coronary artery calcification. Hepatobiliary: Mild caudate and lateral segment left liver lobe enlargement. Normal gallbladder, without biliary ductal dilatation. Pancreas: Normal, without mass or ductal dilatation. Spleen: Normal in size, without focal abnormality. Adrenals/Urinary Tract: Normal adrenal glands. Mild bilateral hydronephrosis and proximal hydroureter. The ureters are somewhat challenging to follow, but no distal hydroureter is seen. The bladder is mildly thick walled with pericystic edema. Stomach/Bowel: Normal stomach, without wall thickening. Scattered colonic diverticula. Normal terminal ileum and appendix. Normal small bowel. Vascular/Lymphatic: Advanced aortic and branch vessel atherosclerosis. Extensive abdominal retroperitoneal adenopathy. Retrocaval index node measures 1.5 cm at 34/2. Gastrohepatic ligament node measures 1.5 cm in 25/2. Pelvic adenopathy with a left external iliac 1.3 cm node on 67/2. Reproductive: Central uterine enlargement at 1.8 cm on sagittal image 82/6. Suggestion of soft tissue fullness within the lower uterine segment and cervix including on 69/2. Periuterine interstitial thickening.No adnexal mass. Other: No abdominal ascites or free intraperitoneal air. Omental nodularity including at 9 mm on 31/2. Peritoneal thickening including of the left lateral conal fascia on 37/2. Musculoskeletal: No acute osseous abnormality. IMPRESSION: 1. Widespread metastatic disease, including to abdominopelvic and lower thoracic nodes, omentum, and lungs as detailed above. 2. Abnormal appearance of the uterus for age.  Favor endometrial or cervical sites of primary. 3. Mild bilateral hydronephrosis and proximal hydroureter. Given absence of distal hydroureter, favor involvement by uterine or cervical primary. 4. Bladder wall thickening and pericystic edema for which cystitis cannot be excluded. 5. No bowel obstruction or other acute complication 6. Hepatic morphology suspicious for mild cirrhosis. 7. Aortic atherosclerosis (ICD10-I70.0), coronary artery atherosclerosis and emphysema (ICD10-J43.9). These results will be called to the ordering clinician or representative by the Radiologist Assistant, and communication documented in the PACS or [employer]. Electronically Signed   By: Jeronimo Greaves M.D.   On: 08/29/2023 17:50    Labs:  CBC: No results for input(s): "WBC", "HGB", "  HCT", "PLT" in the last 8760 hours.  COAGS: No results for input(s): "INR", "APTT" in the last 8760 hours.  BMP: No results for input(s): "NA", "K", "CL", "CO2", "GLUCOSE", "BUN", "CALCIUM", "CREATININE", "GFRNONAA", "GFRAA" in the last 8760 hours.  Invalid input(s): "CMP"  LIVER FUNCTION TESTS: No results for input(s): "BILITOT", "AST", "ALT", "ALKPHOS", "PROT", "ALBUMIN" in the last 8760 hours.  TUMOR MARKERS: No results for input(s): "AFPTM", "CEA", "CA199", "CHROMGRNA" in the last 8760 hours.  Assessment and Plan: Patient with past medical history of DM, HTN, remote MI presents with complaint of newly diagnosed endometrial cancer.  IR consulted for Port-A-Cath placement at the request of Dr. Owens Shark. Case reviewed by Dr. Milford Cage who approves patient for procedure.  Patient presents today in their usual state of health.  She has been NPO and is not currently on blood thinners.   Risks and benefits of image guided port-a-catheter placement was discussed with the patient including, but not limited to bleeding, infection, pneumothorax, or fibrin sheath development and need for additional procedures.  All of the  patient's questions were answered, patient is agreeable to proceed. Consent signed and in chart.    Thank you for this interesting consult.  I greatly enjoyed meeting Tonya [name] and look forward to participating in their care.  A copy of this report was sent to the requesting provider on this date.  Electronically Signed: Hoyt Koch, PA 09/23/2023, 1:22 PM   I spent a total of  30 Minutes   in face to face in clinical consultation, greater than 50% of which was counseling/coordinating care for endometrial cancer.

## 2023-09-23 NOTE — Discharge Instructions (Signed)
 Implanted [facility] Guide  An implanted port is a type of central line that is placed under the skin. Central lines are used to provide IV access when treatment or nutrition needs to be given through a person's veins. Implanted ports are used for long-term IV access. An implanted port may be placed because: You need IV medicine that would be irritating to the small veins in your hands or arms. You need long-term IV medicines, such as antibiotics. You need IV nutrition for a long period. You need frequent blood draws for lab tests. You need dialysis.   Implanted ports are usually placed in the chest area, but they can also be placed in the upper arm, the abdomen, or the leg. An implanted port has two main parts: Reservoir. The reservoir is round and will appear as a small, raised area under your skin. The reservoir is the part where a needle is inserted to give medicines or draw blood. Catheter. The catheter is a thin, flexible tube that extends from the reservoir. The catheter is placed into a large vein. Medicine that is inserted into the reservoir goes into the catheter and then into the vein.   How will I care for my incision  You may shower tomorrow Please remove dressing in 24hrs not other skin care is needed  How is my port accessed? Special steps must be taken to access the port: Before the port is accessed, a numbing cream can be placed on the skin. This helps numb the skin over the port site. Your health care provider uses a sterile technique to access the port. Your health care provider must put on a mask and sterile gloves. The skin over your port is cleaned carefully with an antiseptic and allowed to dry. The port is gently pinched between sterile gloves, and a needle is inserted into the port. Only "non-coring" port needles should be used to access the port. Once the port is accessed, a blood return should be checked. This helps ensure that the port is in the vein and is not  clogged. If your port needs to remain accessed for a constant infusion, a clear (transparent) bandage will be placed over the needle site. The bandage and needle will need to be changed every week, or as directed by your health care provider.   What is flushing? Flushing helps keep the port from getting clogged. Follow your health care provider's instructions on how and when to flush the port. Ports are usually flushed with saline solution or a medicine called heparin. The need for flushing will depend on how the port is used. If the port is used for intermittent medicines or blood draws, the port will need to be flushed: After medicines have been given. After blood has been drawn. As part of routine maintenance. If a constant infusion is running, the port may not need to be flushed.   How long will my port stay implanted? The port can stay in for as long as your health care provider thinks it is needed. When it is time for the port to come out, surgery will be done to remove it. The procedure is similar to the one performed when the port was put in. When should I seek immediate medical care? When you have an implanted port, you should seek immediate medical care if: You notice a bad smell coming from the incision site. You have swelling, redness, or drainage at the incision site. You have more swelling or pain at the  port site or the surrounding area. You have a fever that is not controlled with medicine.   This information is not intended to replace advice given to you by your health care provider. Make sure you discuss any questions you have with your health care provider. Document Released: 08/09/2005 Document Revised: 01/15/2016 Document Reviewed: 04/16/2013 Elsevier Interactive Patient Education  2017 [employer].

## 2023-09-23 NOTE — Procedures (Signed)
Vascular and Interventional Radiology Procedure Note  Patient: Tonya [name] DOB: [birth year] Medical Record Number: [id number] Note Date/Time: 09/23/23 1:45 PM   Performing Physician: Roanna Banning, MD Assistant(s): None  Diagnosis:  GYN CA  Procedure: PORT PLACEMENT  Anesthesia: Conscious Sedation Complications: None Estimated Blood Loss: Minimal  Findings:  Successful right-sided port placement, with the tip of the catheter in the proximal right atrium.  Plan: Catheter ready for use.  See detailed procedure note with images in PACS. The patient tolerated the procedure well without incident or complication and was returned to Recovery in stable condition.    Roanna Banning, MD [facility]   Pager. [phone] Clinic. [phone]

## 2023-09-24 ENCOUNTER — Other Ambulatory Visit: Payer: Self-pay | Admitting: Oncology

## 2023-09-26 ENCOUNTER — Inpatient Hospital Stay: Payer: BC Managed Care – PPO | Admitting: Oncology

## 2023-09-26 ENCOUNTER — Inpatient Hospital Stay: Payer: BC Managed Care – PPO

## 2023-09-26 ENCOUNTER — Encounter: Payer: Self-pay | Admitting: Oncology

## 2023-09-26 ENCOUNTER — Inpatient Hospital Stay: Payer: BC Managed Care – PPO | Attending: Oncology

## 2023-09-26 VITALS — BP 128/74 | HR 100 | Temp 97.4°F | Resp 19 | Wt 265.0 lb

## 2023-09-26 DIAGNOSIS — Z800 Family history of malignant neoplasm of digestive organs: Secondary | ICD-10-CM | POA: Insufficient documentation

## 2023-09-26 DIAGNOSIS — E119 Type 2 diabetes mellitus without complications: Secondary | ICD-10-CM | POA: Diagnosis not present

## 2023-09-26 DIAGNOSIS — I252 Old myocardial infarction: Secondary | ICD-10-CM | POA: Insufficient documentation

## 2023-09-26 DIAGNOSIS — Z79630 Long term (current) use of alkylating agent: Secondary | ICD-10-CM | POA: Insufficient documentation

## 2023-09-26 DIAGNOSIS — Z9089 Acquired absence of other organs: Secondary | ICD-10-CM | POA: Diagnosis not present

## 2023-09-26 DIAGNOSIS — J439 Emphysema, unspecified: Secondary | ICD-10-CM | POA: Insufficient documentation

## 2023-09-26 DIAGNOSIS — E039 Hypothyroidism, unspecified: Secondary | ICD-10-CM | POA: Diagnosis not present

## 2023-09-26 DIAGNOSIS — D75839 Thrombocytosis, unspecified: Secondary | ICD-10-CM | POA: Diagnosis not present

## 2023-09-26 DIAGNOSIS — N179 Acute kidney failure, unspecified: Secondary | ICD-10-CM | POA: Diagnosis not present

## 2023-09-26 DIAGNOSIS — I700 Atherosclerosis of aorta: Secondary | ICD-10-CM | POA: Insufficient documentation

## 2023-09-26 DIAGNOSIS — C7A8 Other malignant neuroendocrine tumors: Secondary | ICD-10-CM | POA: Diagnosis not present

## 2023-09-26 DIAGNOSIS — K5900 Constipation, unspecified: Secondary | ICD-10-CM | POA: Diagnosis not present

## 2023-09-26 DIAGNOSIS — Z8249 Family history of ischemic heart disease and other diseases of the circulatory system: Secondary | ICD-10-CM | POA: Insufficient documentation

## 2023-09-26 DIAGNOSIS — C541 Malignant neoplasm of endometrium: Secondary | ICD-10-CM | POA: Diagnosis not present

## 2023-09-26 DIAGNOSIS — Z79899 Other long term (current) drug therapy: Secondary | ICD-10-CM | POA: Insufficient documentation

## 2023-09-26 DIAGNOSIS — Z5111 Encounter for antineoplastic chemotherapy: Secondary | ICD-10-CM | POA: Insufficient documentation

## 2023-09-26 DIAGNOSIS — R109 Unspecified abdominal pain: Secondary | ICD-10-CM | POA: Diagnosis not present

## 2023-09-26 DIAGNOSIS — N1330 Unspecified hydronephrosis: Secondary | ICD-10-CM | POA: Insufficient documentation

## 2023-09-26 DIAGNOSIS — Z5971 Insufficient health insurance coverage: Secondary | ICD-10-CM | POA: Diagnosis not present

## 2023-09-26 DIAGNOSIS — C7A1 Malignant poorly differentiated neuroendocrine tumors: Secondary | ICD-10-CM | POA: Diagnosis present

## 2023-09-26 DIAGNOSIS — D72829 Elevated white blood cell count, unspecified: Secondary | ICD-10-CM | POA: Diagnosis not present

## 2023-09-26 DIAGNOSIS — K5730 Diverticulosis of large intestine without perforation or abscess without bleeding: Secondary | ICD-10-CM | POA: Diagnosis not present

## 2023-09-26 DIAGNOSIS — Z79634 Long term (current) use of topoisomerase inhibitor: Secondary | ICD-10-CM | POA: Diagnosis not present

## 2023-09-26 DIAGNOSIS — G893 Neoplasm related pain (acute) (chronic): Secondary | ICD-10-CM

## 2023-09-26 DIAGNOSIS — I2510 Atherosclerotic heart disease of native coronary artery without angina pectoris: Secondary | ICD-10-CM | POA: Insufficient documentation

## 2023-09-26 DIAGNOSIS — C7B8 Other secondary neuroendocrine tumors: Secondary | ICD-10-CM

## 2023-09-26 DIAGNOSIS — Z5189 Encounter for other specified aftercare: Secondary | ICD-10-CM | POA: Diagnosis not present

## 2023-09-26 LAB — CBC WITH DIFFERENTIAL (CANCER CENTER ONLY)
Abs Immature Granulocytes: 0.28 10*3/uL — ABNORMAL HIGH (ref 0.00–0.07)
Basophils Absolute: 0.1 10*3/uL (ref 0.0–0.1)
Basophils Relative: 0 %
Eosinophils Absolute: 0 10*3/uL (ref 0.0–0.5)
Eosinophils Relative: 0 %
HCT: 40.8 % (ref 36.0–46.0)
Hemoglobin: 14.2 g/dL (ref 12.0–15.0)
Immature Granulocytes: 2 %
Lymphocytes Relative: 5 %
Lymphs Abs: 1 10*3/uL (ref 0.7–4.0)
MCH: 31.2 pg (ref 26.0–34.0)
MCHC: 34.8 g/dL (ref 30.0–36.0)
MCV: 89.7 fL (ref 80.0–100.0)
Monocytes Absolute: 1.6 10*3/uL — ABNORMAL HIGH (ref 0.1–1.0)
Monocytes Relative: 9 %
Neutro Abs: 15.5 10*3/uL — ABNORMAL HIGH (ref 1.7–7.7)
Neutrophils Relative %: 84 %
Platelet Count: 460 10*3/uL — ABNORMAL HIGH (ref 150–400)
RBC: 4.55 MIL/uL (ref 3.87–5.11)
RDW: 13.8 % (ref 11.5–15.5)
WBC Count: 18.4 10*3/uL — ABNORMAL HIGH (ref 4.0–10.5)
nRBC: 0 % (ref 0.0–0.2)

## 2023-09-26 LAB — CMP (CANCER CENTER ONLY)
ALT: 15 U/L (ref 0–44)
AST: 59 U/L — ABNORMAL HIGH (ref 15–41)
Albumin: 3.1 g/dL — ABNORMAL LOW (ref 3.5–5.0)
Alkaline Phosphatase: 94 U/L (ref 38–126)
Anion gap: 14 (ref 5–15)
BUN: 43 mg/dL — ABNORMAL HIGH (ref 6–20)
CO2: 21 mmol/L — ABNORMAL LOW (ref 22–32)
Calcium: 9.5 mg/dL (ref 8.9–10.3)
Chloride: 86 mmol/L — ABNORMAL LOW (ref 98–111)
Creatinine: 1.39 mg/dL — ABNORMAL HIGH (ref 0.44–1.00)
GFR, Estimated: 44 mL/min — ABNORMAL LOW (ref >60)
Glucose, Bld: 138 mg/dL — ABNORMAL HIGH (ref 70–99)
Potassium: 4.2 mmol/L (ref 3.5–5.1)
Sodium: 121 mmol/L — ABNORMAL LOW (ref 135–145)
Total Bilirubin: 1.2 mg/dL (ref 0.0–1.2)
Total Protein: 7.3 g/dL (ref 6.5–8.1)

## 2023-09-26 LAB — TSH: TSH: 22.816 u[IU]/mL — ABNORMAL HIGH (ref 0.350–4.500)

## 2023-09-26 MED ORDER — CARBOPLATIN CHEMO INJECTION 600 MG/60ML
540.0000 mg | Freq: Once | INTRAVENOUS | Status: AC
  Administered 2023-09-26: 540 mg via INTRAVENOUS
  Filled 2023-09-26: qty 54

## 2023-09-26 MED ORDER — DEXAMETHASONE SODIUM PHOSPHATE 10 MG/ML IJ SOLN
10.0000 mg | Freq: Once | INTRAMUSCULAR | Status: AC
  Administered 2023-09-26: 10 mg via INTRAVENOUS
  Filled 2023-09-26: qty 1

## 2023-09-26 MED ORDER — SODIUM CHLORIDE 0.9% FLUSH
10.0000 mL | INTRAVENOUS | Status: DC | PRN
  Administered 2023-09-26: 10 mL
  Filled 2023-09-26: qty 10

## 2023-09-26 MED ORDER — FOSAPREPITANT DIMEGLUMINE INJECTION 150 MG
150.0000 mg | Freq: Once | INTRAVENOUS | Status: AC
  Administered 2023-09-26: 150 mg via INTRAVENOUS
  Filled 2023-09-26: qty 150

## 2023-09-26 MED ORDER — HEPARIN SOD (PORK) LOCK FLUSH 100 UNIT/ML IV SOLN
500.0000 [IU] | Freq: Once | INTRAVENOUS | Status: AC | PRN
  Administered 2023-09-26: 500 [IU]
  Filled 2023-09-26: qty 5

## 2023-09-26 MED ORDER — SODIUM CHLORIDE 0.9 % IV SOLN
Freq: Once | INTRAVENOUS | Status: AC
  Filled 2023-09-26: qty 250

## 2023-09-26 MED ORDER — MORPHINE SULFATE (PF) 2 MG/ML IV SOLN
4.0000 mg | Freq: Once | INTRAVENOUS | Status: AC
  Administered 2023-09-26: 4 mg via INTRAVENOUS
  Filled 2023-09-26: qty 2

## 2023-09-26 MED ORDER — MORPHINE SULFATE (PF) 2 MG/ML IV SOLN: 4.0000 mg | Freq: Once | INTRAVENOUS | Status: DC

## 2023-09-26 MED ORDER — SODIUM CHLORIDE 0.9 % IV SOLN
INTRAVENOUS | Status: DC
  Filled 2023-09-26: qty 250

## 2023-09-26 MED ORDER — SODIUM CHLORIDE 0.9 % IV SOLN
100.0000 mg/m2 | Freq: Once | INTRAVENOUS | Status: AC
  Administered 2023-09-26: 224 mg via INTRAVENOUS
  Filled 2023-09-26: qty 11.2

## 2023-09-26 MED ORDER — PALONOSETRON HCL INJECTION 0.25 MG/5ML
0.2500 mg | Freq: Once | INTRAVENOUS | Status: AC
  Administered 2023-09-26: 0.25 mg via INTRAVENOUS
  Filled 2023-09-26: qty 5

## 2023-09-26 MED ORDER — SODIUM CHLORIDE 0.9 % IV SOLN
Freq: Once | INTRAVENOUS | Status: DC
  Filled 2023-09-26: qty 250

## 2023-09-26 NOTE — Patient Instructions (Addendum)
[facility] - A DEPT OF [facility]  Discharge Instructions: Thank you for choosing [facility] to provide your oncology and hematology care.  If you have a lab appointment with the Cancer Center, please go directly to the Cancer Center and check in at the registration area.  Wear comfortable clothing and clothing appropriate for easy access to any Portacath or PICC line.   We strive to give you quality time with your provider. You may need to reschedule your appointment if you arrive late (15 or more minutes).  Arriving late affects you and other patients whose appointments are after yours.  Also, if you miss three or more appointments without notifying the office, you may be dismissed from the clinic at the provider's discretion.      For prescription refill requests, have your pharmacy contact our office and allow 72 hours for refills to be completed.    Today you received the following chemotherapy and/or immunotherapy agents carboplatin, etoposide      To help prevent nausea and vomiting after your treatment, we encourage you to take your nausea medication as directed.  BELOW ARE SYMPTOMS THAT SHOULD BE REPORTED IMMEDIATELY: *FEVER GREATER THAN 100.4 F (38 C) OR HIGHER *CHILLS OR SWEATING *NAUSEA AND VOMITING THAT IS NOT CONTROLLED WITH YOUR NAUSEA MEDICATION *UNUSUAL SHORTNESS OF BREATH *UNUSUAL BRUISING OR BLEEDING *URINARY PROBLEMS (pain or burning when urinating, or frequent urination) *BOWEL PROBLEMS (unusual diarrhea, constipation, pain near the anus) TENDERNESS IN MOUTH AND THROAT WITH OR WITHOUT PRESENCE OF ULCERS (sore throat, sores in mouth, or a toothache) UNUSUAL RASH, SWELLING OR PAIN  UNUSUAL VAGINAL DISCHARGE OR ITCHING   Items with * indicate a potential emergency and should be followed up as soon as possible or go to the Emergency Department if any problems should occur.  Please show the CHEMOTHERAPY ALERT CARD or  IMMUNOTHERAPY ALERT CARD at check-in to the Emergency Department and triage nurse.  Should you have questions after your visit or need to cancel or reschedule your appointment, please contact [facility] - A DEPT OF [facility]  [phone] and follow the prompts.  Office hours are 8:00 a.m. to 4:30 p.m. Monday - Friday. Please note that voicemails left after 4:00 p.m. may not be returned until the following business day.  We are closed weekends and major holidays. You have access to a nurse at all times for urgent questions. Please call the main number to the clinic [phone] and follow the prompts.  For any non-urgent questions, you may also contact your provider using MyChart. We now offer e-Visits for anyone 54 and older to request care online for non-urgent symptoms. For details visit [email or web address].   Also download the MyChart app! Go to the app store, search "MyChart", open the app, select [place], and log in with your MyChart username and password.  Etoposide Injection What is this medication? ETOPOSIDE (e toe POE side) treats some types of cancer. It works by slowing down the growth of cancer cells. This medicine may be used for other purposes; ask your health care provider or pharmacist if you have questions. COMMON BRAND NAME(S): Etopophos, Toposar, VePesid What should I tell my care team before I take this medication? They need to know if you have any of these conditions: Infection Kidney disease Liver disease Low blood counts, such as low white cell, platelet, red cell counts An unusual or allergic reaction to  etoposide, other medications, foods, dyes, or preservatives If you or your partner are pregnant or trying to get pregnant Breastfeeding How should I use this medication? This medication is injected into a vein. It is given by your care team in a hospital or clinic setting. Talk to your care team about the use of this  medication in children. Special care may be needed. Overdosage: If you think you have taken too much of this medicine contact a poison control center or emergency room at once. NOTE: This medicine is only for you. Do not share this medicine with others. What if I miss a dose? Keep appointments for follow-up doses. It is important not to miss your dose. Call your care team if you are unable to keep an appointment. What may interact with this medication? Warfarin This list may not describe all possible interactions. Give your health care provider a list of all the medicines, herbs, non-prescription drugs, or dietary supplements you use. Also tell them if you smoke, drink alcohol, or use illegal drugs. Some items may interact with your medicine. What should I watch for while using this medication? Your condition will be monitored carefully while you are receiving this medication. This medication may make you feel generally unwell. This is not uncommon as chemotherapy can affect healthy cells as well as cancer cells. Report any side effects. Continue your course of treatment even though you feel ill unless your care team tells you to stop. This medication can cause serious side effects. To reduce the risk, your care team may give you other medications to take before receiving this one. Be sure to follow the directions from your care team. This medication may increase your risk of getting an infection. Call your care team for advice if you get a fever, chills, sore throat, or other symptoms of a cold or flu. Do not treat yourself. Try to avoid being around people who are sick. This medication may increase your risk to bruise or bleed. Call your care team if you notice any unusual bleeding. Talk to your care team about your risk of cancer. You may be more at risk for certain types of cancers if you take this medication. Talk to your care team if you may be pregnant. Serious birth defects can occur if you  take this medication during pregnancy and for 6 months after the last dose. You will need a negative pregnancy test before starting this medication. Contraception is recommended while taking this medication and for 6 months after the last dose. Your care team can help you find the option that works for you. If your partner can get pregnant, use a condom during sex while taking this medication and for 4 months after the last dose. Do not breastfeed while taking this medication. This medication may cause infertility. Talk to your care team if you are concerned about your fertility. What side effects may I notice from receiving this medication? Side effects that you should report to your care team as soon as possible: Allergic reactions--skin rash, itching, hives, swelling of the face, lips, tongue, or throat Infection--fever, chills, cough, sore throat, wounds that don't heal, pain or trouble when passing urine, general feeling of discomfort or being unwell Low red blood cell level--unusual weakness or fatigue, dizziness, headache, trouble breathing Unusual bruising or bleeding Side effects that usually do not require medical attention (report to your care team if they continue or are bothersome): Diarrhea Fatigue Hair loss Loss of appetite Nausea Vomiting This list  may not describe all possible side effects. Call your doctor for medical advice about side effects. You may report side effects to FDA at 1-800-FDA-1088. Where should I keep my medication? This medication is given in a hospital or clinic. It will not be stored at home. NOTE: This sheet is a summary. It may not cover all possible information. If you have questions about this medicine, talk to your doctor, pharmacist, or health care provider.  2024 Elsevier/Gold Standard (2021-12-31 00:00:00)  Carboplatin Injection What is this medication? CARBOPLATIN (KAR boe pla tin) treats some types of cancer. It works by slowing down the growth  of cancer cells. This medicine may be used for other purposes; ask your health care provider or pharmacist if you have questions. COMMON BRAND NAME(S): Paraplatin What should I tell my care team before I take this medication? They need to know if you have any of these conditions: Blood disorders Hearing problems Kidney disease Recent or ongoing radiation therapy An unusual or allergic reaction to carboplatin, cisplatin, other medications, foods, dyes, or preservatives Pregnant or trying to get pregnant Breast-feeding How should I use this medication? This medication is injected into a vein. It is given by your care team in a hospital or clinic setting. Talk to your care team about the use of this medication in children. Special care may be needed. Overdosage: If you think you have taken too much of this medicine contact a poison control center or emergency room at once. NOTE: This medicine is only for you. Do not share this medicine with others. What if I miss a dose? Keep appointments for follow-up doses. It is important not to miss your dose. Call your care team if you are unable to keep an appointment. What may interact with this medication? Medications for seizures Some antibiotics, such as amikacin, gentamicin, neomycin, streptomycin, tobramycin Vaccines This list may not describe all possible interactions. Give your health care provider a list of all the medicines, herbs, non-prescription drugs, or dietary supplements you use. Also tell them if you smoke, drink alcohol, or use illegal drugs. Some items may interact with your medicine. What should I watch for while using this medication? Your condition will be monitored carefully while you are receiving this medication. You may need blood work while taking this medication. This medication may make you feel generally unwell. This is not uncommon, as chemotherapy can affect healthy cells as well as cancer cells. Report any side effects.  Continue your course of treatment even though you feel ill unless your care team tells you to stop. In some cases, you may be given additional medications to help with side effects. Follow all directions for their use. This medication may increase your risk of getting an infection. Call your care team for advice if you get a fever, chills, sore throat, or other symptoms of a cold or flu. Do not treat yourself. Try to avoid being around people who are sick. Avoid taking medications that contain aspirin, acetaminophen, ibuprofen, naproxen, or ketoprofen unless instructed by your care team. These medications may hide a fever. Be careful brushing or flossing your teeth or using a toothpick because you may get an infection or bleed more easily. If you have any dental work done, tell your dentist you are receiving this medication. Talk to your care team if you wish to become pregnant or think you might be pregnant. This medication can cause serious birth defects. Talk to your care team about effective forms of contraception. Do not  breast-feed while taking this medication. What side effects may I notice from receiving this medication? Side effects that you should report to your care team as soon as possible: Allergic reactions--skin rash, itching, hives, swelling of the face, lips, tongue, or throat Infection--fever, chills, cough, sore throat, wounds that don't heal, pain or trouble when passing urine, general feeling of discomfort or being unwell Low red blood cell level--unusual weakness or fatigue, dizziness, headache, trouble breathing Pain, tingling, or numbness in the hands or feet, muscle weakness, change in vision, confusion or trouble speaking, loss of balance or coordination, trouble walking, seizures Unusual bruising or bleeding Side effects that usually do not require medical attention (report to your care team if they continue or are bothersome): Hair loss Nausea Unusual weakness or  fatigue Vomiting This list may not describe all possible side effects. Call your doctor for medical advice about side effects. You may report side effects to FDA at 1-800-FDA-1088. Where should I keep my medication? This medication is given in a hospital or clinic. It will not be stored at home. NOTE: This sheet is a summary. It may not cover all possible information. If you have questions about this medicine, talk to your doctor, pharmacist, or health care provider.  2024 Elsevier/Gold Standard (2021-12-01 00:00:00)

## 2023-09-26 NOTE — Progress Notes (Signed)
Ok to use CrCl = 82 mL/min for Carboplatin dose today per Dr. Smith Robert.  Dose updated to 540 mg.  Ebony Hail, Pharm.D., CPP 09/26/2023@9 :33 AM

## 2023-09-26 NOTE — Progress Notes (Signed)
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  This was followed by a CT abdomen pelvis with contrast which showed widespread metastatic disease to abdominopelvic and thoracic nodes, omentum and lungs.  Abnormal appearance of the uterus and lower uterine segment and cervix favoring endometrial or cervical primary.  Patient was seen by Dr. Johnnette Litter and underwent endometrial and cervical biopsy as well as biopsy of the retroperitoneal lymph node.  Cervical biopsy was consistent with squamous mucosa and abundant necrotic debris highly suspicious but not definitively diagnostic for malignancy.  FNA of the retroperitoneal lymph node showed poorly differentiated carcinoma with neuroendocrine features.   Comment: The following immunohistochemistry was performed after review of the clinical history and morphology to further characterize the pathologic process. The results are as follows:   A1-14  PanCK                      positive  A1-15  P16                           Positive, patchy A1-16  P53                           Normal/wild-type  A1-17  PAX8                        Focal blush positive  A1-18  ER Dx Only              negative   A1-19  Bio Neg Ctrl              Appropriately reactive control slide  A1-22  CK7                          Positive, patchy A1-23  CK20                        negative  A1-24  GATA 3                    negative  A1-25  CDX-2                      Positive, patchy A1-26  TTF-1                        negative  A1-27  INSM-1                     Positive, patchy weak A1-28SynaptophysinPositive, patchy weak A1-31  BRG-1  Favor retained (weak) nuclear staining A1-32  BAF 47                     Retained nuclear staining A1-33  P63                           negative  A1-34  Claudin-4                  Positive, patchy    Smears and cell block show a poorly differentiated carcinoma. The immunophenotype is non-specific, but shows evidence of neuroendocrine differentiation.    Family history significant for stage IV colon cancer in her mother in her 54s.  Sister had breast cancer in her 53s.  Interval history-patient is currently on oxycodone which she mainly uses at night.  She is concerned about constipation with oxycodone.  Also uses tramadol during the day.  Last bowel movement was on Saturday.  Presently she is experiencing more pain in her lower abdomen and feels like her abdomen is tight.  Also reports on and off vaginal bleeding  ECOG PS- 2 Pain scale- 6  Review of systems- Review of Systems  Constitutional:  Positive for malaise/fatigue. Negative for chills, fever and weight loss.  HENT:  Negative for congestion, ear discharge and nosebleeds.   Eyes:  Negative for blurred vision.  Respiratory:  Negative for cough, hemoptysis, sputum production, shortness of breath and wheezing.   Cardiovascular:  Negative for chest pain, palpitations, orthopnea and claudication.  Gastrointestinal:  Positive for abdominal pain. Negative for blood in stool, constipation, diarrhea, heartburn, melena, nausea and vomiting.  Genitourinary:  Negative for dysuria, flank pain, frequency,  hematuria and urgency.  Musculoskeletal:  Negative for back pain, joint pain and myalgias.  Skin:  Negative for rash.  Neurological:  Negative for dizziness, tingling, focal weakness, seizures, weakness and headaches.  Endo/Heme/Allergies:  Does not bruise/bleed easily.  Psychiatric/Behavioral:  Negative for depression and suicidal ideas. The patient does not have insomnia.       No Known Allergies   Past Medical History:  Diagnosis Date   Coronary artery disease    GERD (gastroesophageal reflux disease)    Hypertension    Hypothyroidism    Myocardial infarction (HCC)    Thyroid disease      Past Surgical History:  Procedure Laterality Date   CARDIAC CATHETERIZATION     CESAREAN SECTION     CORONARY STENT INTERVENTION N/A 07/24/2019   Procedure: CORONARY STENT INTERVENTION;  Surgeon: Yvonne Kendall, MD;  Location: [facility] INVASIVE CV LAB;  Service: Cardiovascular;  Laterality: N/A;  RCA & CFX   IR IMAGING GUIDED PORT INSERTION  09/23/2023   LEFT HEART CATH AND CORONARY ANGIOGRAPHY N/A 07/24/2019   Procedure: LEFT HEART CATH AND CORONARY ANGIOGRAPHY;  Surgeon: Lamar Blinks, MD;  Location: [facility] INVASIVE CV LAB;  Service: Cardiovascular;  Laterality: N/A;   TONSILLECTOMY      Social History   Socioeconomic History   Marital status: Married    Spouse name: Kris Burd   Number of children: 1   Years of education: Not on file   Highest education level: Not on file  Occupational History   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)  Tobacco Use   Smoking status: Never   Smokeless tobacco: Never  Vaping Use   Vaping status: Never Used  Substance and Sexual Activity   Alcohol use: Not Currently    Comment: occ   Drug use:  Never   Sexual activity: Not Currently  Other Topics Concern   Not on file  Social History Narrative   Not on file   Social Drivers of Health   Financial Resource Strain: Low Risk  (07/23/2019)   Overall Financial Resource Strain (CARDIA)     Difficulty of Paying Living Expenses: Not hard at all  Food Insecurity: No Food Insecurity (09/21/2023)   Hunger Vital Sign    Worried About Running Out of Food in the Last Year: Never true    Ran Out of Food in the Last Year: Never true  Transportation Needs: No Transportation Needs (09/21/2023)   PRAPARE - Administrator, Civil Service (Medical): No    Lack of Transportation (Non-Medical): No  Physical Activity: Unknown (07/23/2019)   Exercise Vital Sign    Days of Exercise per Week: 2 days    Minutes of Exercise per Session: Not on file  Stress: No Stress Concern Present (07/23/2019)   [employer] of Occupational Health - Occupational Stress Questionnaire    Feeling of Stress : Not at all  Social Connections: Not on file  Intimate Partner Violence: Not At Risk (09/21/2023)   Humiliation, Afraid, Rape, and Kick questionnaire    Fear of Current or Ex-Partner: No    Emotionally Abused: No    Physically Abused: No    Sexually Abused: No    Family History  Problem Relation Age of Onset   Atrial fibrillation Mother        Passed January 2024.   Colon cancer Mother        Diagnosed 2007   Heart Problems Father    Heart attack Father    Heart disease Father      Current Outpatient Medications:    aspirin EC 81 MG tablet, Take 81 mg by mouth daily. (Patient not taking: Reported on 09/21/2023), Disp: , Rfl:    bisacodyl (DULCOLAX) 10 MG suppository, Place rectally., Disp: , Rfl:    Cholecalciferol (VITAMIN D-1000 MAX ST) 25 MCG (1000 UT) tablet, Take by mouth., Disp: , Rfl:    dexamethasone (DECADRON) 4 MG tablet, Take 2 tabs by mouth starting day after last dose of etoposide for one day only. Repeat every 21 days. Take with food., Disp: 8 tablet, Rfl: 0   JARDIANCE 10 MG TABS tablet, Take 10 mg by mouth daily. (Patient not taking: Reported on 09/21/2023), Disp: , Rfl:    lidocaine-prilocaine (EMLA) cream, Apply to affected area once, Disp: 30 g, Rfl: 3   losartan  (COZAAR) 25 MG tablet, Take 1 tablet (25 mg total) by mouth daily., Disp: 90 tablet, Rfl: 3   Multiple Vitamin (MULTI-VITAMIN) tablet, Take 1 tablet by mouth daily., Disp: , Rfl:    nitroGLYCERIN (NITROSTAT) 0.4 MG SL tablet, Place 1 tablet (0.4 mg total) under the tongue every 5 (five) minutes as needed for chest pain. (Patient not taking: Reported on 09/21/2023), Disp: 25 tablet, Rfl: 3   ondansetron (ZOFRAN) 8 MG tablet, Take 1 tablet (8 mg total) by mouth every 8 (eight) hours as needed for nausea, vomiting or refractory nausea / vomiting. Start on the third day after carboplatin., Disp: 30 tablet, Rfl: 1   oxyCODONE (OXY IR/ROXICODONE) 5 MG immediate release tablet, Take by mouth., Disp: , Rfl:    OZEMPIC, 0.25 OR 0.5 MG/DOSE, 2 MG/3ML SOPN, Inject 0.5 mg into the skin once a week. (Patient not taking: Reported on 09/21/2023), Disp: , Rfl:    prochlorperazine (COMPAZINE) 10 MG tablet, Take  1 tablet (10 mg total) by mouth every 6 (six) hours as needed for nausea or vomiting., Disp: 30 tablet, Rfl: 1   rosuvastatin (CRESTOR) 10 MG tablet, Take 10 mg by mouth daily. (Patient not taking: Reported on 09/21/2023), Disp: , Rfl:    thyroid (ARMOUR) 120 MG tablet, Take 120 mg by mouth daily before breakfast., Disp: , Rfl:    traMADol (ULTRAM) 50 MG tablet, Take 50 mg by mouth 3 (three) times daily as needed., Disp: , Rfl:   Physical exam:  Vitals:   09/26/23 0846  BP: 128/74  Pulse: 100  Resp: 19  Temp: (!) 97.4 F (36.3 C)  TempSrc: Tympanic  SpO2: 96%  Weight: 265 lb (120.2 kg)   Physical Exam Cardiovascular:     Rate and Rhythm: Normal rate and regular rhythm.     Heart sounds: Normal heart sounds.  Pulmonary:     Effort: Pulmonary effort is normal.     Breath sounds: Normal breath sounds.  Abdominal:     General: Bowel sounds are normal.     Palpations: Abdomen is soft.     Tenderness: There is abdominal tenderness.  Skin:    General: Skin is warm and dry.  Neurological:     Mental  Status: She is alert and oriented to person, place, and time.         Latest Ref Rng & Units 07/25/2019    5:30 AM  CMP  Glucose 70 - 99 mg/dL 846   BUN 6 - 20 mg/dL 9   Creatinine 9.62 - 9.52 mg/dL 8.41   Sodium 324 - 401 mmol/L 137   Potassium 3.5 - 5.1 mmol/L 3.6   Chloride 98 - 111 mmol/L 104   CO2 22 - 32 mmol/L 24   Calcium 8.9 - 10.3 mg/dL 9.1       Latest Ref Rng & Units 09/26/2023    8:24 AM  CBC  WBC 4.0 - 10.5 K/uL 18.4   Hemoglobin 12.0 - 15.0 g/dL 02.7   Hematocrit 25.3 - 46.0 % 40.8   Platelets 150 - 400 K/uL 460     No images are attached to the encounter.  IR IMAGING GUIDED PORT INSERTION Result Date: 09/23/2023 INDICATION: chemotherapy administration History of gyn malignancy. EXAM: IMPLANTED PORT A CATH PLACEMENT WITH ULTRASOUND AND FLUOROSCOPIC GUIDANCE MEDICATIONS: 25 mg Benadryl IV. ANESTHESIA/SEDATION: Moderate (conscious) sedation was employed during this procedure. A total of Versed 1 mg and Fentanyl 100 mcg was administered intravenously. Moderate Sedation Time: 21 minutes. The patient's level of consciousness and vital signs were monitored continuously by radiology nursing throughout the procedure under my direct supervision. FLUOROSCOPY TIME:  Fluoroscopic dose; 4 mGy COMPLICATIONS: None immediate. PROCEDURE: The procedure, risks, benefits, and alternatives were explained to the patient. Questions regarding the procedure were encouraged and answered. The patient understands and consents to the procedure. The RIGHT neck and chest were prepped with chlorhexidine in a sterile fashion, and a sterile drape was applied covering the operative field. Maximum barrier sterile technique with sterile gowns and gloves were used for the procedure. A timeout was performed prior to the initiation of the procedure. Local anesthesia was provided with 1% lidocaine with epinephrine. After creating a small venotomy incision, a micropuncture kit was utilized to access the internal  jugular vein under direct, real-time ultrasound guidance. Ultrasound image documentation was performed. The microwire was kinked to measure appropriate catheter length. A subcutaneous port pocket was then created along the upper chest wall utilizing a combination of  sharp and blunt dissection. The pocket was irrigated with sterile saline. A single lumen power injectable port was chosen for placement. The 8 Fr catheter was tunneled from the port pocket site to the venotomy incision. The port was placed in the pocket. The external catheter was trimmed to appropriate length. At the venotomy, an 8 Fr peel-away sheath was placed over a guidewire under fluoroscopic guidance. The catheter was then placed through the sheath and the sheath was removed. Final catheter positioning was confirmed and documented with a fluoroscopic spot radiograph. The port was accessed with a Huber needle, aspirated and flushed with heparinized saline. The port pocket incision was closed with interrupted 3-0 Vicryl suture then Dermabond was applied, including at the venotomy incision. Dressings were placed. The patient tolerated the procedure well without immediate post procedural complication. IMPRESSION: Successful placement of a RIGHT internal jugular approach power injectable Port-A-Cath. The tip of the catheter is positioned within the proximal RIGHT atrium. The catheter is ready for immediate use. Roanna Banning, MD [facility] Radiology Electronically Signed   By: Roanna Banning M.D.   On: 09/23/2023 14:42   CT ABDOMEN PELVIS W CONTRAST Result Date: 08/29/2023 CLINICAL DATA:  Abdominal pain for several weeks. 9 pound weight loss. * Tracking Code: BO * EXAM: CT ABDOMEN AND PELVIS WITH CONTRAST TECHNIQUE: Multidetector CT imaging of the abdomen and pelvis was performed using the standard protocol following bolus administration of intravenous contrast. RADIATION DOSE REDUCTION: This exam was  performed according to the departmental dose-optimization program which includes automated exposure control, adjustment of the mA and/or kV according to patient size and/or use of iterative reconstruction technique. CONTRAST:  OMNIPAQUE IOHEXOL 300 MG/ML  SOLN COMPARISON:  None Available. FINDINGS: Lower chest: Multiple tiny bilateral pulmonary nodules. Example left lower lobe at 4 mm on 16/4. 3 mm on the right lower lobe on 20/4. Retrocrural adenopathy at 1.0 cm on 19/2. Prominent but not pathologically sized lower mediastinal nodes are incompletely imaged. Mild cardiomegaly with multivessel coronary artery calcification. Hepatobiliary: Mild caudate and lateral segment left liver lobe enlargement. Normal gallbladder, without biliary ductal dilatation. Pancreas: Normal, without mass or ductal dilatation. Spleen: Normal in size, without focal abnormality. Adrenals/Urinary Tract: Normal adrenal glands. Mild bilateral hydronephrosis and proximal hydroureter. The ureters are somewhat challenging to follow, but no distal hydroureter is seen. The bladder is mildly thick walled with pericystic edema. Stomach/Bowel: Normal stomach, without wall thickening. Scattered colonic diverticula. Normal terminal ileum and appendix. Normal small bowel. Vascular/Lymphatic: Advanced aortic and branch vessel atherosclerosis. Extensive abdominal retroperitoneal adenopathy. Retrocaval index node measures 1.5 cm at 34/2. Gastrohepatic ligament node measures 1.5 cm in 25/2. Pelvic adenopathy with a left external iliac 1.3 cm node on 67/2. Reproductive: Central uterine enlargement at 1.8 cm on sagittal image 82/6. Suggestion of soft tissue fullness within the lower uterine segment and cervix including on 69/2. Periuterine interstitial thickening.No adnexal mass. Other: No abdominal ascites or free intraperitoneal air. Omental nodularity including at 9 mm on 31/2. Peritoneal thickening including of the left lateral conal fascia on 37/2.  Musculoskeletal: No acute osseous abnormality. IMPRESSION: 1. Widespread metastatic disease, including to abdominopelvic and lower thoracic nodes, omentum, and lungs as detailed above. 2. Abnormal appearance of the uterus for age. Favor endometrial or cervical sites of primary. 3. Mild bilateral hydronephrosis and proximal hydroureter. Given absence of distal hydroureter, favor involvement by uterine or cervical primary. 4. Bladder wall thickening and pericystic edema for which cystitis cannot be excluded. 5. No bowel  obstruction or other acute complication 6. Hepatic morphology suspicious for mild cirrhosis. 7. Aortic atherosclerosis (ICD10-I70.0), coronary artery atherosclerosis and emphysema (ICD10-J43.9). These results will be called to the ordering clinician or representative by the Radiologist Assistant, and communication documented in the PACS or [employer]. Electronically Signed   By: Jeronimo Greaves M.D.   On: 08/29/2023 17:50     Assessment and plan- Patient is a 58 y.o. 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  Tecentriq so far has not been approved by her insurance and I will be doing peer-to-peer review today and we can see if we can get it approved by next cycle.  PET scan is later this week  AKI: Likely secondary to poor oral intake: We will give her 1 L of IV fluids today and see her back in 10 days for possible IV fluids with NP Ivin Booty Borders  Neoplasm related pain: Continue as needed oxycodone and I have encouraged her to remain on aggressive bowel regimen but not shy away from using oxycodone if she is in pain.  She is meeting with NP Laurette Schimke in 2 days and we may have to consider adding nonnarcotic medications such as gabapentin for her pain  control as well.  Leukocytosis/thrombocytosis likely reactive secondary to malignancy: Continue to monitor  Vaginal bleeding: Secondary to malignancy.  Hemoglobin is stable and hopefully symptoms improved once chemotherapy is initiated.   Visit Diagnosis 1. Encounter for antineoplastic chemotherapy   2. Neoplasm related pain   3. Endometrial carcinoma (HCC)      Dr. Owens Shark, MD, MPH [facility] [phone] 09/26/2023 8:44 AM

## 2023-09-27 ENCOUNTER — Inpatient Hospital Stay: Payer: BC Managed Care – PPO

## 2023-09-27 ENCOUNTER — Other Ambulatory Visit: Payer: Self-pay

## 2023-09-27 VITALS — BP 156/70 | HR 93 | Temp 97.3°F | Resp 18

## 2023-09-27 DIAGNOSIS — Z5111 Encounter for antineoplastic chemotherapy: Secondary | ICD-10-CM | POA: Diagnosis not present

## 2023-09-27 DIAGNOSIS — C7A8 Other malignant neuroendocrine tumors: Secondary | ICD-10-CM

## 2023-09-27 LAB — T4: T4, Total: 3.3 ug/dL — ABNORMAL LOW (ref 4.5–12.0)

## 2023-09-27 MED ORDER — SODIUM CHLORIDE 0.9% FLUSH
10.0000 mL | INTRAVENOUS | Status: DC | PRN
  Filled 2023-09-27: qty 10

## 2023-09-27 MED ORDER — SODIUM CHLORIDE 0.9 % IV SOLN
100.0000 mg/m2 | Freq: Once | INTRAVENOUS | Status: AC
  Administered 2023-09-27: 224 mg via INTRAVENOUS
  Filled 2023-09-27: qty 11.2

## 2023-09-27 MED ORDER — SODIUM CHLORIDE 0.9 % IV SOLN
INTRAVENOUS | Status: DC
Start: 2023-09-27 — End: 2023-09-27
  Filled 2023-09-27: qty 250

## 2023-09-27 MED ORDER — HEPARIN SOD (PORK) LOCK FLUSH 100 UNIT/ML IV SOLN
500.0000 [IU] | Freq: Once | INTRAVENOUS | Status: DC | PRN
  Filled 2023-09-27: qty 5

## 2023-09-27 MED ORDER — DEXAMETHASONE SODIUM PHOSPHATE 10 MG/ML IJ SOLN
10.0000 mg | Freq: Once | INTRAMUSCULAR | Status: AC
  Administered 2023-09-27: 10 mg via INTRAVENOUS
  Filled 2023-09-27: qty 1

## 2023-09-27 NOTE — Patient Instructions (Signed)
 [facility] - A DEPT OF [facility]  Discharge Instructions: Thank you for choosing [facility] to provide your oncology and hematology care.  If you have a lab appointment with the Cancer Center, please go directly to the Cancer Center and check in at the registration area.  Wear comfortable clothing and clothing appropriate for easy access to any Portacath or PICC line.   We strive to give you quality time with your provider. You may need to reschedule your appointment if you arrive late (15 or more minutes).  Arriving late affects you and other patients whose appointments are after yours.  Also, if you miss three or more appointments without notifying the office, you may be dismissed from the clinic at the provider's discretion.      For prescription refill requests, have your pharmacy contact our office and allow 72 hours for refills to be completed.    Today you received the following chemotherapy and/or immunotherapy agents ETOPOSIDE      To help prevent nausea and vomiting after your treatment, we encourage you to take your nausea medication as directed.  BELOW ARE SYMPTOMS THAT SHOULD BE REPORTED IMMEDIATELY: *FEVER GREATER THAN 100.4 F (38 C) OR HIGHER *CHILLS OR SWEATING *NAUSEA AND VOMITING THAT IS NOT CONTROLLED WITH YOUR NAUSEA MEDICATION *UNUSUAL SHORTNESS OF BREATH *UNUSUAL BRUISING OR BLEEDING *URINARY PROBLEMS (pain or burning when urinating, or frequent urination) *BOWEL PROBLEMS (unusual diarrhea, constipation, pain near the anus) TENDERNESS IN MOUTH AND THROAT WITH OR WITHOUT PRESENCE OF ULCERS (sore throat, sores in mouth, or a toothache) UNUSUAL RASH, SWELLING OR PAIN  UNUSUAL VAGINAL DISCHARGE OR ITCHING   Items with * indicate a potential emergency and should be followed up as soon as possible or go to the Emergency Department if any problems should occur.  Please show the CHEMOTHERAPY ALERT CARD or IMMUNOTHERAPY  ALERT CARD at check-in to the Emergency Department and triage nurse.  Should you have questions after your visit or need to cancel or reschedule your appointment, please contact [facility] - A DEPT OF [facility]  [phone] and follow the prompts.  Office hours are 8:00 a.m. to 4:30 p.m. Monday - Friday. Please note that voicemails left after 4:00 p.m. may not be returned until the following business day.  We are closed weekends and major holidays. You have access to a nurse at all times for urgent questions. Please call the main number to the clinic [phone] and follow the prompts.  For any non-urgent questions, you may also contact your provider using MyChart. We now offer e-Visits for anyone 58 and older to request care online for non-urgent symptoms. For details visit [email or web address].   Also download the MyChart app! Go to the app store, search "MyChart", open the app, select [place], and log in with your MyChart username and password.  Etoposide Injection What is this medication? ETOPOSIDE (e toe POE side) treats some types of cancer. It works by slowing down the growth of cancer cells. This medicine may be used for other purposes; ask your health care provider or pharmacist if you have questions. COMMON BRAND NAME(S): Etopophos, Toposar, VePesid What should I tell my care team before I take this medication? They need to know if you have any of these conditions: Infection Kidney disease Liver disease Low blood counts, such as low white cell, platelet, red cell counts An unusual or allergic reaction to etoposide,  other medications, foods, dyes, or preservatives If you or your partner are pregnant or trying to get pregnant Breastfeeding How should I use this medication? This medication is injected into a vein. It is given by your care team in a hospital or clinic setting. Talk to your care team about the use of this medication in  children. Special care may be needed. Overdosage: If you think you have taken too much of this medicine contact a poison control center or emergency room at once. NOTE: This medicine is only for you. Do not share this medicine with others. What if I miss a dose? Keep appointments for follow-up doses. It is important not to miss your dose. Call your care team if you are unable to keep an appointment. What may interact with this medication? Warfarin This list may not describe all possible interactions. Give your health care provider a list of all the medicines, herbs, non-prescription drugs, or dietary supplements you use. Also tell them if you smoke, drink alcohol, or use illegal drugs. Some items may interact with your medicine. What should I watch for while using this medication? Your condition will be monitored carefully while you are receiving this medication. This medication may make you feel generally unwell. This is not uncommon as chemotherapy can affect healthy cells as well as cancer cells. Report any side effects. Continue your course of treatment even though you feel ill unless your care team tells you to stop. This medication can cause serious side effects. To reduce the risk, your care team may give you other medications to take before receiving this one. Be sure to follow the directions from your care team. This medication may increase your risk of getting an infection. Call your care team for advice if you get a fever, chills, sore throat, or other symptoms of a cold or flu. Do not treat yourself. Try to avoid being around people who are sick. This medication may increase your risk to bruise or bleed. Call your care team if you notice any unusual bleeding. Talk to your care team about your risk of cancer. You may be more at risk for certain types of cancers if you take this medication. Talk to your care team if you may be pregnant. Serious birth defects can occur if you take this  medication during pregnancy and for 6 months after the last dose. You will need a negative pregnancy test before starting this medication. Contraception is recommended while taking this medication and for 6 months after the last dose. Your care team can help you find the option that works for you. If your partner can get pregnant, use a condom during sex while taking this medication and for 4 months after the last dose. Do not breastfeed while taking this medication. This medication may cause infertility. Talk to your care team if you are concerned about your fertility. What side effects may I notice from receiving this medication? Side effects that you should report to your care team as soon as possible: Allergic reactions--skin rash, itching, hives, swelling of the face, lips, tongue, or throat Infection--fever, chills, cough, sore throat, wounds that don't heal, pain or trouble when passing urine, general feeling of discomfort or being unwell Low red blood cell level--unusual weakness or fatigue, dizziness, headache, trouble breathing Unusual bruising or bleeding Side effects that usually do not require medical attention (report to your care team if they continue or are bothersome): Diarrhea Fatigue Hair loss Loss of appetite Nausea Vomiting This list may  not describe all possible side effects. Call your doctor for medical advice about side effects. You may report side effects to FDA at 1-800-FDA-1088. Where should I keep my medication? This medication is given in a hospital or clinic. It will not be stored at home. NOTE: This sheet is a summary. It may not cover all possible information. If you have questions about this medicine, talk to your doctor, pharmacist, or health care provider.  2024 Elsevier/Gold Standard (2021-12-31 00:00:00)

## 2023-09-28 ENCOUNTER — Inpatient Hospital Stay (HOSPITAL_BASED_OUTPATIENT_CLINIC_OR_DEPARTMENT_OTHER): Payer: BC Managed Care – PPO | Admitting: Hospice and Palliative Medicine

## 2023-09-28 ENCOUNTER — Inpatient Hospital Stay: Payer: BC Managed Care – PPO | Admitting: Hospice and Palliative Medicine

## 2023-09-28 ENCOUNTER — Encounter: Payer: Self-pay | Admitting: Obstetrics and Gynecology

## 2023-09-28 ENCOUNTER — Ambulatory Visit: Payer: BC Managed Care – PPO

## 2023-09-28 ENCOUNTER — Inpatient Hospital Stay: Payer: BC Managed Care – PPO

## 2023-09-28 VITALS — BP 148/74 | HR 75 | Temp 97.8°F | Resp 18

## 2023-09-28 DIAGNOSIS — C7B8 Other secondary neuroendocrine tumors: Secondary | ICD-10-CM

## 2023-09-28 DIAGNOSIS — Z515 Encounter for palliative care: Secondary | ICD-10-CM | POA: Diagnosis not present

## 2023-09-28 DIAGNOSIS — Z5111 Encounter for antineoplastic chemotherapy: Secondary | ICD-10-CM | POA: Diagnosis not present

## 2023-09-28 DIAGNOSIS — C7A8 Other malignant neuroendocrine tumors: Secondary | ICD-10-CM

## 2023-09-28 MED ORDER — HEPARIN SOD (PORK) LOCK FLUSH 100 UNIT/ML IV SOLN
500.0000 [IU] | Freq: Once | INTRAVENOUS | Status: AC | PRN
  Administered 2023-09-28: 500 [IU]
  Filled 2023-09-28: qty 5

## 2023-09-28 MED ORDER — SODIUM CHLORIDE 0.9 % IV SOLN
INTRAVENOUS | Status: DC
  Filled 2023-09-28: qty 250

## 2023-09-28 MED ORDER — OXYCODONE HCL 5 MG PO TABS
5.0000 mg | ORAL_TABLET | Freq: Once | ORAL | Status: AC
  Administered 2023-09-28: 5 mg via ORAL
  Filled 2023-09-28: qty 1

## 2023-09-28 MED ORDER — DEXAMETHASONE SODIUM PHOSPHATE 10 MG/ML IJ SOLN
10.0000 mg | Freq: Once | INTRAMUSCULAR | Status: AC
  Administered 2023-09-28: 10 mg via INTRAVENOUS
  Filled 2023-09-28: qty 1

## 2023-09-28 MED ORDER — SODIUM CHLORIDE 0.9 % IV SOLN
100.0000 mg/m2 | Freq: Once | INTRAVENOUS | Status: AC
  Administered 2023-09-28: 224 mg via INTRAVENOUS
  Filled 2023-09-28: qty 11.2

## 2023-09-28 NOTE — Progress Notes (Signed)
 Palliative Medicine [facility] at [facility] Telephone:([phone] [phone]   Name: Tonya [name] Date: 09/28/2023 MRN: [id number]  DOB: 12-05-1965  Patient Care Team: Cleotilde Oneil FALCON, MD as PCP - General (Internal Medicine) End, Lonni, MD as PCP - Cardiology (Cardiology) Maurie Rayfield BIRCH, RN as Oncology Nurse Navigator    REASON FOR CONSULTATION: Tonya [name] is a 58 y.o. 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  Patient has had pain.  Was referred to palliative care to address goals and manage ongoing symptoms.  SOCIAL HISTORY:     reports that she has never smoked. She has never used smokeless tobacco. She reports that she does not currently use alcohol . She reports that she does not use drugs.  Patient is married lives at home with her husband.  She has a son who lives in [place] .  Patient originally from [place]  but moved here about 6 years ago.  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  ADVANCE DIRECTIVES:    CODE STATUS:   PAST MEDICAL HISTORY: Past Medical History:  Diagnosis Date   Coronary artery disease    GERD (gastroesophageal reflux disease)    Hypertension    Hypothyroidism    Myocardial infarction (HCC)    Thyroid  disease     PAST SURGICAL HISTORY:  Past Surgical History:  Procedure Laterality Date   CARDIAC CATHETERIZATION     CESAREAN SECTION     CORONARY STENT INTERVENTION N/A 07/24/2019   Procedure: CORONARY STENT INTERVENTION;  Surgeon: Mady Lonni, MD;  Location: [facility] INVASIVE CV LAB;  Service: Cardiovascular;  Laterality: N/A;  RCA & CFX   IR IMAGING GUIDED PORT INSERTION  09/23/2023   LEFT HEART CATH AND CORONARY ANGIOGRAPHY N/A 07/24/2019   Procedure: LEFT HEART CATH AND CORONARY ANGIOGRAPHY;  Surgeon: Hester Wolm PARAS, MD;  Location: [facility]  INVASIVE CV LAB;  Service: Cardiovascular;  Laterality: N/A;   TONSILLECTOMY      HEMATOLOGY/ONCOLOGY HISTORY:  Oncology History  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 [facility])  09/21/2023 Initial Diagnosis   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)   09/26/2023 -  Chemotherapy   Patient is on Treatment Plan : neuronedocrine endometrium Carboplatin  + Etoposide  + Atezolizumab Induction q21d x 4 cycles / Atezolizumab Maintenance q21d     Endometrial carcinoma (HCC)  09/21/2023 Initial Diagnosis   Endometrial carcinoma (HCC)   09/21/2023 Cancer Staging   Staging form: Corpus Uteri - Carcinoma and Carcinosarcoma, AJCC 8th Edition and FIGO 2023 - Clinical stage from 09/21/2023: FIGO Stage IVC, calculated as Stage IVB (cN2a, pM1) - Signed by Melanee Annah BROCKS, MD on 09/21/2023 Histologic grade (G): G3 Histologic grading system: 3 grade system     ALLERGIES:  has no known allergies.  MEDICATIONS:  Current Outpatient Medications  Medication Sig Dispense Refill   aspirin  EC 81 MG tablet Take 81 mg by mouth daily. (Patient not taking: Reported on 09/21/2023)     bisacodyl  (DULCOLAX) 10 MG suppository Place rectally.     Cholecalciferol (VITAMIN D-1000 MAX ST) 25 MCG (1000 UT) tablet Take by mouth.     dexamethasone  (DECADRON ) 4 MG tablet Take 2 tabs by mouth starting day after last dose of etoposide  for one day only. Repeat every 21 days. Take with food. 8 tablet 0   JARDIANCE 10 MG TABS tablet Take 10 mg by mouth daily. (Patient not taking: Reported on 09/21/2023)  lidocaine -prilocaine  (EMLA ) cream Apply to affected area once 30 g 3   losartan  (COZAAR ) 25 MG tablet Take 1 tablet (25 mg total) by mouth daily. 90 tablet 3   Multiple Vitamin (MULTI-VITAMIN) tablet Take 1 tablet by mouth daily.     nitroGLYCERIN  (NITROSTAT ) 0.4 MG SL tablet Place 1 tablet (0.4 mg total) under the tongue every 5 (five) minutes as needed for chest pain. (Patient not taking: Reported on  09/21/2023) 25 tablet 3   ondansetron  (ZOFRAN ) 8 MG tablet Take 1 tablet (8 mg total) by mouth every 8 (eight) hours as needed for nausea, vomiting or refractory nausea / vomiting. Start on the third day after carboplatin . 30 tablet 1   oxyCODONE  (OXY IR/ROXICODONE ) 5 MG immediate release tablet Take by mouth.     OZEMPIC, 0.25 OR 0.5 MG/DOSE, 2 MG/3ML SOPN Inject 0.5 mg into the skin once a week. (Patient not taking: Reported on 09/21/2023)     prochlorperazine  (COMPAZINE ) 10 MG tablet Take 1 tablet (10 mg total) by mouth every 6 (six) hours as needed for nausea or vomiting. 30 tablet 1   rosuvastatin (CRESTOR) 10 MG tablet Take 10 mg by mouth daily. (Patient not taking: Reported on 09/21/2023)     thyroid  (ARMOUR) 120 MG tablet Take 120 mg by mouth daily before breakfast.     traMADol (ULTRAM) 50 MG tablet Take 50 mg by mouth 3 (three) times daily as needed.     No current facility-administered medications for this visit.   Facility-Administered Medications Ordered in Other Visits  Medication Dose Route Frequency Provider Last Rate Last Admin   0.9 %  sodium chloride  infusion   Intravenous Continuous Melanee Annah BROCKS, MD 10 mL/hr at 09/28/23 1349 New Bag at 09/28/23 1349   etoposide  (VEPESID ) 224 mg in sodium chloride  0.9 % 1,000 mL chemo infusion  100 mg/m2 (Order-Specific) Intravenous Once Rao, Archana C, MD       heparin  lock flush 100 unit/mL  500 Units Intracatheter Once PRN Rao, Archana C, MD        VITAL SIGNS: LMP 07/22/2016  There were no vitals filed for this visit.  Estimated body mass index is 45.49 kg/m as calculated from the following:   Height as of 09/23/23: 5' 4 (1.626 m).   Weight as of 09/26/23: 265 lb (120.2 kg).  LABS: CBC:    Component Value Date/Time   WBC 18.4 (H) 09/26/2023 0824   WBC 8.2 07/25/2019 0530   HGB 14.2 09/26/2023 0824   HCT 40.8 09/26/2023 0824   PLT 460 (H) 09/26/2023 0824   MCV 89.7 09/26/2023 0824   NEUTROABS 15.5 (H) 09/26/2023 0824    LYMPHSABS 1.0 09/26/2023 0824   MONOABS 1.6 (H) 09/26/2023 0824   EOSABS 0.0 09/26/2023 0824   BASOSABS 0.1 09/26/2023 0824   Comprehensive Metabolic Panel:    Component Value Date/Time   NA 121 (L) 09/26/2023 0824   K 4.2 09/26/2023 0824   CL 86 (L) 09/26/2023 0824   CO2 21 (L) 09/26/2023 0824   BUN 43 (H) 09/26/2023 0824   CREATININE 1.39 (H) 09/26/2023 0824   GLUCOSE 138 (H) 09/26/2023 0824   CALCIUM  9.5 09/26/2023 0824   AST 59 (H) 09/26/2023 0824   ALT 15 09/26/2023 0824   ALKPHOS 94 09/26/2023 0824   BILITOT 1.2 09/26/2023 0824   PROT 7.3 09/26/2023 0824   ALBUMIN  3.1 (L) 09/26/2023 0824    RADIOGRAPHIC STUDIES: IR IMAGING GUIDED PORT INSERTION Result Date: 09/23/2023 INDICATION: chemotherapy administration History of  gyn malignancy. EXAM: IMPLANTED PORT A CATH PLACEMENT WITH ULTRASOUND AND FLUOROSCOPIC GUIDANCE MEDICATIONS: 25 mg Benadryl  IV. ANESTHESIA/SEDATION: Moderate (conscious) sedation was employed during this procedure. A total of Versed  1 mg and Fentanyl  100 mcg was administered intravenously. Moderate Sedation Time: 21 minutes. The patient's level of consciousness and vital signs were monitored continuously by radiology nursing throughout the procedure under my direct supervision. FLUOROSCOPY TIME:  Fluoroscopic dose; 4 mGy COMPLICATIONS: None immediate. PROCEDURE: The procedure, risks, benefits, and alternatives were explained to the patient. Questions regarding the procedure were encouraged and answered. The patient understands and consents to the procedure. The RIGHT neck and chest were prepped with chlorhexidine  in a sterile fashion, and a sterile drape was applied covering the operative field. Maximum barrier sterile technique with sterile gowns and gloves were used for the procedure. A timeout was performed prior to the initiation of the procedure. Local anesthesia was provided with 1% lidocaine  with epinephrine . After creating a small venotomy incision, a  micropuncture kit was utilized to access the internal jugular vein under direct, real-time ultrasound guidance. Ultrasound image documentation was performed. The microwire was kinked to measure appropriate catheter length. A subcutaneous port pocket was then created along the upper chest wall utilizing a combination of sharp and blunt dissection. The pocket was irrigated with sterile saline. A single lumen power injectable port was chosen for placement. The 8 Fr catheter was tunneled from the port pocket site to the venotomy incision. The port was placed in the pocket. The external catheter was trimmed to appropriate length. At the venotomy, an 8 Fr peel-away sheath was placed over a guidewire under fluoroscopic guidance. The catheter was then placed through the sheath and the sheath was removed. Final catheter positioning was confirmed and documented with a fluoroscopic spot radiograph. The port was accessed with a Huber needle, aspirated and flushed with heparinized saline. The port pocket incision was closed with interrupted 3-0 Vicryl suture then Dermabond was applied, including at the venotomy incision. Dressings were placed. The patient tolerated the procedure well without immediate post procedural complication. IMPRESSION: Successful placement of a RIGHT internal jugular approach power injectable Port-A-Cath. The tip of the catheter is positioned within the proximal RIGHT atrium. The catheter is ready for immediate use. Thom Hall, MD [facility]iology Electronically Signed   By: Thom Hall M.D.   On: 09/23/2023 14:42   CT ABDOMEN PELVIS W CONTRAST Result Date: 08/29/2023 CLINICAL DATA:  Abdominal pain for several weeks. 9 pound weight loss. * Tracking Code: BO * EXAM: CT ABDOMEN AND PELVIS WITH CONTRAST TECHNIQUE: Multidetector CT imaging of the abdomen and pelvis was performed using the standard protocol following bolus administration of intravenous  contrast. RADIATION DOSE REDUCTION: This exam was performed according to the departmental dose-optimization program which includes automated exposure control, adjustment of the mA and/or kV according to patient size and/or use of iterative reconstruction technique. CONTRAST:  OMNIPAQUE  IOHEXOL  300 MG/ML  SOLN COMPARISON:  None Available. FINDINGS: Lower chest: Multiple tiny bilateral pulmonary nodules. Example left lower lobe at 4 mm on 16/4. 3 mm on the right lower lobe on 20/4. Retrocrural adenopathy at 1.0 cm on 19/2. Prominent but not pathologically sized lower mediastinal nodes are incompletely imaged. Mild cardiomegaly with multivessel coronary artery calcification. Hepatobiliary: Mild caudate and lateral segment left liver lobe enlargement. Normal gallbladder, without biliary ductal dilatation. Pancreas: Normal, without mass or ductal dilatation. Spleen: Normal in size, without focal abnormality. Adrenals/Urinary Tract: Normal adrenal glands. Mild bilateral  hydronephrosis and proximal hydroureter. The ureters are somewhat challenging to follow, but no distal hydroureter is seen. The bladder is mildly thick walled with pericystic edema. Stomach/Bowel: Normal stomach, without wall thickening. Scattered colonic diverticula. Normal terminal ileum and appendix. Normal small bowel. Vascular/Lymphatic: Advanced aortic and branch vessel atherosclerosis. Extensive abdominal retroperitoneal adenopathy. Retrocaval index node measures 1.5 cm at 34/2. Gastrohepatic ligament node measures 1.5 cm in 25/2. Pelvic adenopathy with a left external iliac 1.3 cm node on 67/2. Reproductive: Central uterine enlargement at 1.8 cm on sagittal image 82/6. Suggestion of soft tissue fullness within the lower uterine segment and cervix including on 69/2. Periuterine interstitial thickening.No adnexal mass. Other: No abdominal ascites or free intraperitoneal air. Omental nodularity including at 9 mm on 31/2. Peritoneal thickening  including of the left lateral conal fascia on 37/2. Musculoskeletal: No acute osseous abnormality. IMPRESSION: 1. Widespread metastatic disease, including to abdominopelvic and lower thoracic nodes, omentum, and lungs as detailed above. 2. Abnormal appearance of the uterus for age. Favor endometrial or cervical sites of primary. 3. Mild bilateral hydronephrosis and proximal hydroureter. Given absence of distal hydroureter, favor involvement by uterine or cervical primary. 4. Bladder wall thickening and pericystic edema for which cystitis cannot be excluded. 5. No bowel obstruction or other acute complication 6. Hepatic morphology suspicious for mild cirrhosis. 7. Aortic atherosclerosis (ICD10-I70.0), coronary artery atherosclerosis and emphysema (ICD10-J43.9). These results will be called to the ordering clinician or representative by the Radiologist Assistant, and communication documented in the PACS or [employer]. Electronically Signed   By: Rockey Kilts M.D.   On: 08/29/2023 17:50    PERFORMANCE STATUS (ECOG) : 1 - Symptomatic but completely ambulatory  Review of Systems Unless otherwise noted, a complete review of systems is negative.  Physical Exam General: NAD Pulmonary: Unlabored Abdomen: nontender Skin: no rashes Neurological: Weakness but otherwise nonfocal  IMPRESSION: I met with patient in infusion.  Introduced palliative care services and attempted to establish therapeutic rapport.  Patient states that she is aware of the significance of having a stage IV cancer.  She told her son yesterday about the cancer diagnosis but has not communicated to either her husband or son discussions about long-term prognosis.  At this point, patient states that she is in agreement with current scope of treatment.  She wants to continue fighting.  She has a strong faith in God and prayer.  Symptomatically, patient endorses lower pelvic pain.  She does feel abdominal fullness and states that she  has frequent gas pain.  Patient is having loose bowel movements over the past several weeks.  Does have history of constipation.  Patient has been taking tramadol and acetaminophen  for pain but has not found that to be particularly helpful.  She took her first dose of oxycodone  yesterday.  Patient describes some hesitancy with taking opioids due to fears of constipation.  We did discuss bowel regimen in detail today.  No overt signs of obstruction.  Patient is not having nausea or vomiting.  She is pending PET scan later this week.  Discussed possible option of hypogastric plexus block and can refer to IR if clinically appropriate on imaging.  Otherwise, discussed maximizing oxycodone  and will have RN give her a dose in clinic today.  Would discontinue tramadol as not to have patient on 2 short acting opioids.  PLAN: -Continue current scope of treatment -DC tramadol -Maximize use of oxycodone  as needed for pain -Daily bowel regimen -Patient pending PET scan -Consider hypogastric plexus block -Referrals to  nutrition and SW -RTC next week  Case and plan discussed with Dr. Melanee  Patient expressed understanding and was in agreement with this plan. She also understands that She can call the clinic at any time with any questions, concerns, or complaints.     Time Total: 25 minutes  Visit consisted of counseling and education dealing with the complex and emotionally intense issues of symptom management and palliative care in the setting of serious and potentially life-threatening illness.Greater than 50%  of this time was spent counseling and coordinating care related to the above assessment and plan.  Signed by: Fonda Mower, PhD, NP-C

## 2023-09-28 NOTE — Patient Instructions (Signed)
 [facility] - A DEPT OF [facility]  Discharge Instructions: Thank you for choosing [facility] to provide your oncology and hematology care.  If you have a lab appointment with the Cancer Center, please go directly to the Cancer Center and check in at the registration area.  Wear comfortable clothing and clothing appropriate for easy access to any Portacath or PICC line.   We strive to give you quality time with your provider. You may need to reschedule your appointment if you arrive late (15 or more minutes).  Arriving late affects you and other patients whose appointments are after yours.  Also, if you miss three or more appointments without notifying the office, you may be dismissed from the clinic at the provider's discretion.      For prescription refill requests, have your pharmacy contact our office and allow 72 hours for refills to be completed.    Today you received the following chemotherapy and/or immunotherapy agents Etoposide      To help prevent nausea and vomiting after your treatment, we encourage you to take your nausea medication as directed.  BELOW ARE SYMPTOMS THAT SHOULD BE REPORTED IMMEDIATELY: *FEVER GREATER THAN 100.4 F (38 C) OR HIGHER *CHILLS OR SWEATING *NAUSEA AND VOMITING THAT IS NOT CONTROLLED WITH YOUR NAUSEA MEDICATION *UNUSUAL SHORTNESS OF BREATH *UNUSUAL BRUISING OR BLEEDING *URINARY PROBLEMS (pain or burning when urinating, or frequent urination) *BOWEL PROBLEMS (unusual diarrhea, constipation, pain near the anus) TENDERNESS IN MOUTH AND THROAT WITH OR WITHOUT PRESENCE OF ULCERS (sore throat, sores in mouth, or a toothache) UNUSUAL RASH, SWELLING OR PAIN  UNUSUAL VAGINAL DISCHARGE OR ITCHING   Items with * indicate a potential emergency and should be followed up as soon as possible or go to the Emergency Department if any problems should occur.  Please show the CHEMOTHERAPY ALERT CARD or IMMUNOTHERAPY  ALERT CARD at check-in to the Emergency Department and triage nurse.  Should you have questions after your visit or need to cancel or reschedule your appointment, please contact [facility] - A DEPT OF [facility]  [phone] and follow the prompts.  Office hours are 8:00 a.m. to 4:30 p.m. Monday - Friday. Please note that voicemails left after 4:00 p.m. may not be returned until the following business day.  We are closed weekends and major holidays. You have access to a nurse at all times for urgent questions. Please call the main number to the clinic [phone] and follow the prompts.  For any non-urgent questions, you may also contact your provider using MyChart. We now offer e-Visits for anyone 36 and older to request care online for non-urgent symptoms. For details visit [email or web address].   Also download the MyChart app! Go to the app store, search "MyChart", open the app, select [facility], and log in with your MyChart username and password.

## 2023-09-29 ENCOUNTER — Inpatient Hospital Stay: Payer: BC Managed Care – PPO

## 2023-09-29 ENCOUNTER — Encounter: Payer: Self-pay | Admitting: Oncology

## 2023-09-29 NOTE — Progress Notes (Signed)
 [facility] Clinical Social Work  Initial Assessment   Tonya [name] is a 58 y.o. year old female contacted by phone. Clinical Social Work was referred by medical provider, [employer], for assessment of psychosocial needs.   SDOH (Social Determinants of Health) assessments performed: Yes SDOH Interventions    Flowsheet Row Clinical Support from 09/29/2023 in [facility] - A Dept Of [place]iac [facility] from 08/09/2019 in [facility] Cardiac and Pulmonary Rehab  SDOH Interventions    Depression Interventions/Treatment  -- PHQ2-9 Score <4 Follow-up Not Indicated  Health Literacy Interventions Intervention Not Indicated --       SDOH Screenings   Food Insecurity: No Food Insecurity (09/21/2023)  Housing: Low Risk  (09/21/2023)  Transportation Needs: No Transportation Needs (09/21/2023)  Utilities: Not At Risk (09/21/2023)  Depression (PHQ2-9): Low Risk  (09/21/2023)  Financial Resource Strain: Low Risk  (07/23/2019)  Physical Activity: Unknown (07/23/2019)  Stress: No Stress Concern Present (07/23/2019)  Tobacco Use: Low Risk  (09/26/2023)  Health Literacy: Adequate Health Literacy (09/29/2023)     Distress Screen completed: No    09/21/2023    8:47 AM  ONCBCN DISTRESS SCREENING  Screening Type Initial Screening  Distress experienced in past week (1-10) 0      Family/Social Information:  Housing Arrangement: patient lives with her husband. Family members/support persons in your life? Family and Friends.  Her son lives in [place] . 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 Advanced directives: No Services Currently in place:  BCBS  Coping/ Adjustment to diagnosis: Patient  understands treatment plan and what happens next? yes Concerns about diagnosis and/or treatment: How I will pay for the services I need Patient reported stressors: Finances Hopes and/or priorities: Family Patient enjoys time with family/ friends Current coping skills/ strengths: Ability for insight , Average or above average intelligence , Capable of independent living , Manufacturing systems engineer , General fund of knowledge , Motivation for treatment/growth , Special hobby/interest , and Supportive family/friends     SUMMARY: Current SDOH Barriers:  Financial constraints related to medical bills  Clinical Social Work Clinical Goal(s):  Explore community resource options for unmet needs related to:  Financial Strain   Interventions: Discussed common feeling and emotions when being diagnosed with cancer, and the importance of support during treatment Informed patient of the support team roles and support services at [facility] Provided CSW contact information and encouraged patient to call with any questions or concerns Provided patient with information about resources available.  Mailed [facility] Patient Financial Assistance information and Triage Cancer information.   Follow Up Plan: CSW will follow-up with patient by phone  Patient verbalizes understanding of plan: Yes    Macario CHRISTELLA Au, LCSW Clinical Social Worker [facility]

## 2023-09-30 ENCOUNTER — Inpatient Hospital Stay: Payer: BC Managed Care – PPO

## 2023-09-30 ENCOUNTER — Encounter: Payer: Self-pay | Admitting: Oncology

## 2023-09-30 ENCOUNTER — Telehealth: Payer: Self-pay

## 2023-09-30 ENCOUNTER — Ambulatory Visit: Payer: BC Managed Care – PPO

## 2023-09-30 DIAGNOSIS — Z5111 Encounter for antineoplastic chemotherapy: Secondary | ICD-10-CM | POA: Diagnosis not present

## 2023-09-30 DIAGNOSIS — C7A8 Other malignant neuroendocrine tumors: Secondary | ICD-10-CM

## 2023-09-30 MED ORDER — PEGFILGRASTIM-JMDB 6 MG/0.6ML ~~LOC~~ SOSY
6.0000 mg | PREFILLED_SYRINGE | Freq: Once | SUBCUTANEOUS | Status: AC
  Administered 2023-09-30: 6 mg via SUBCUTANEOUS
  Filled 2023-09-30: qty 0.6

## 2023-09-30 NOTE — Telephone Encounter (Signed)
 Patient was here for her fulphia injection, as I am about to go get her injection from the pharmacy I see that Sidra Borders was trying to encourage the patient to go to the ED because her ankle are swollen. She told me that if she wakes up in the morning and the swelling hasn't gone down then she would be going to the ED.

## 2023-09-30 NOTE — Telephone Encounter (Signed)
 I spoke with patient. Discussed recommendation of Dr. Melanee about possible evaluation in the ED. Patient declined. We also discussed option of follow up early next week in clinic if needed.   Patient mostly concerned about poor oral intake. She is pending eval by nutritionist.

## 2023-10-02 ENCOUNTER — Other Ambulatory Visit: Payer: Self-pay

## 2023-10-02 ENCOUNTER — Emergency Department: Payer: BC Managed Care – PPO

## 2023-10-02 ENCOUNTER — Inpatient Hospital Stay
Admission: EM | Admit: 2023-10-02 | Discharge: 2023-10-06 | DRG: 871 | Disposition: A | Discharge status: Home / Self Care | Payer: BC Managed Care – PPO | Attending: Obstetrics and Gynecology | Admitting: Obstetrics and Gynecology

## 2023-10-02 DIAGNOSIS — A0472 Enterocolitis due to Clostridium difficile, not specified as recurrent: Secondary | ICD-10-CM | POA: Diagnosis not present

## 2023-10-02 DIAGNOSIS — E119 Type 2 diabetes mellitus without complications: Secondary | ICD-10-CM

## 2023-10-02 DIAGNOSIS — E1169 Type 2 diabetes mellitus with other specified complication: Secondary | ICD-10-CM | POA: Diagnosis present

## 2023-10-02 DIAGNOSIS — R54 Age-related physical debility: Secondary | ICD-10-CM | POA: Diagnosis present

## 2023-10-02 DIAGNOSIS — N1330 Unspecified hydronephrosis: Secondary | ICD-10-CM | POA: Diagnosis present

## 2023-10-02 DIAGNOSIS — E872 Acidosis, unspecified: Secondary | ICD-10-CM | POA: Insufficient documentation

## 2023-10-02 DIAGNOSIS — D701 Agranulocytosis secondary to cancer chemotherapy: Secondary | ICD-10-CM | POA: Diagnosis present

## 2023-10-02 DIAGNOSIS — T451X5A Adverse effect of antineoplastic and immunosuppressive drugs, initial encounter: Secondary | ICD-10-CM | POA: Diagnosis present

## 2023-10-02 DIAGNOSIS — A419 Sepsis, unspecified organism: Principal | ICD-10-CM

## 2023-10-02 DIAGNOSIS — E785 Hyperlipidemia, unspecified: Secondary | ICD-10-CM | POA: Diagnosis present

## 2023-10-02 DIAGNOSIS — N179 Acute kidney failure, unspecified: Secondary | ICD-10-CM | POA: Insufficient documentation

## 2023-10-02 DIAGNOSIS — Z7982 Long term (current) use of aspirin: Secondary | ICD-10-CM

## 2023-10-02 DIAGNOSIS — C7A1 Malignant poorly differentiated neuroendocrine tumors: Secondary | ICD-10-CM | POA: Diagnosis present

## 2023-10-02 DIAGNOSIS — E039 Hypothyroidism, unspecified: Secondary | ICD-10-CM | POA: Diagnosis present

## 2023-10-02 DIAGNOSIS — D6959 Other secondary thrombocytopenia: Secondary | ICD-10-CM | POA: Diagnosis present

## 2023-10-02 DIAGNOSIS — B955 Unspecified streptococcus as the cause of diseases classified elsewhere: Secondary | ICD-10-CM

## 2023-10-02 DIAGNOSIS — I255 Ischemic cardiomyopathy: Secondary | ICD-10-CM | POA: Diagnosis present

## 2023-10-02 DIAGNOSIS — K5900 Constipation, unspecified: Secondary | ICD-10-CM | POA: Diagnosis present

## 2023-10-02 DIAGNOSIS — Z794 Long term (current) use of insulin: Secondary | ICD-10-CM

## 2023-10-02 DIAGNOSIS — D61818 Other pancytopenia: Secondary | ICD-10-CM | POA: Insufficient documentation

## 2023-10-02 DIAGNOSIS — A409 Streptococcal sepsis, unspecified: Principal | ICD-10-CM | POA: Diagnosis present

## 2023-10-02 DIAGNOSIS — Z7985 Long-term (current) use of injectable non-insulin antidiabetic drugs: Secondary | ICD-10-CM

## 2023-10-02 DIAGNOSIS — D72819 Decreased white blood cell count, unspecified: Secondary | ICD-10-CM | POA: Insufficient documentation

## 2023-10-02 DIAGNOSIS — R197 Diarrhea, unspecified: Secondary | ICD-10-CM | POA: Insufficient documentation

## 2023-10-02 DIAGNOSIS — Z8249 Family history of ischemic heart disease and other diseases of the circulatory system: Secondary | ICD-10-CM

## 2023-10-02 DIAGNOSIS — Z79899 Other long term (current) drug therapy: Secondary | ICD-10-CM

## 2023-10-02 DIAGNOSIS — E871 Hypo-osmolality and hyponatremia: Secondary | ICD-10-CM | POA: Insufficient documentation

## 2023-10-02 DIAGNOSIS — C786 Secondary malignant neoplasm of retroperitoneum and peritoneum: Secondary | ICD-10-CM | POA: Diagnosis present

## 2023-10-02 DIAGNOSIS — G893 Neoplasm related pain (acute) (chronic): Secondary | ICD-10-CM | POA: Diagnosis present

## 2023-10-02 DIAGNOSIS — I10 Essential (primary) hypertension: Secondary | ICD-10-CM | POA: Diagnosis present

## 2023-10-02 DIAGNOSIS — I25119 Atherosclerotic heart disease of native coronary artery with unspecified angina pectoris: Secondary | ICD-10-CM | POA: Diagnosis present

## 2023-10-02 DIAGNOSIS — Z7984 Long term (current) use of oral hypoglycemic drugs: Secondary | ICD-10-CM

## 2023-10-02 DIAGNOSIS — D61810 Antineoplastic chemotherapy induced pancytopenia: Secondary | ICD-10-CM | POA: Diagnosis not present

## 2023-10-02 DIAGNOSIS — E876 Hypokalemia: Secondary | ICD-10-CM | POA: Diagnosis present

## 2023-10-02 DIAGNOSIS — R6520 Severe sepsis without septic shock: Secondary | ICD-10-CM | POA: Diagnosis not present

## 2023-10-02 DIAGNOSIS — Z6841 Body Mass Index (BMI) 40.0 and over, adult: Secondary | ICD-10-CM

## 2023-10-02 DIAGNOSIS — K219 Gastro-esophageal reflux disease without esophagitis: Secondary | ICD-10-CM | POA: Diagnosis present

## 2023-10-02 DIAGNOSIS — I252 Old myocardial infarction: Secondary | ICD-10-CM

## 2023-10-02 DIAGNOSIS — Z800 Family history of malignant neoplasm of digestive organs: Secondary | ICD-10-CM

## 2023-10-02 DIAGNOSIS — D709 Neutropenia, unspecified: Secondary | ICD-10-CM | POA: Insufficient documentation

## 2023-10-02 DIAGNOSIS — Z955 Presence of coronary angioplasty implant and graft: Secondary | ICD-10-CM

## 2023-10-02 DIAGNOSIS — C541 Malignant neoplasm of endometrium: Secondary | ICD-10-CM | POA: Diagnosis present

## 2023-10-02 DIAGNOSIS — E222 Syndrome of inappropriate secretion of antidiuretic hormone: Secondary | ICD-10-CM | POA: Diagnosis present

## 2023-10-02 DIAGNOSIS — C775 Secondary and unspecified malignant neoplasm of intrapelvic lymph nodes: Secondary | ICD-10-CM | POA: Diagnosis present

## 2023-10-02 HISTORY — DX: Malignant (primary) neoplasm, unspecified: C80.1

## 2023-10-02 LAB — COMPREHENSIVE METABOLIC PANEL
ALT: 24 U/L (ref 0–44)
AST: 43 U/L — ABNORMAL HIGH (ref 15–41)
Albumin: 3 g/dL — ABNORMAL LOW (ref 3.5–5.0)
Alkaline Phosphatase: 106 U/L (ref 38–126)
Anion gap: 15 (ref 5–15)
BUN: 46 mg/dL — ABNORMAL HIGH (ref 6–20)
CO2: 16 mmol/L — ABNORMAL LOW (ref 22–32)
Calcium: 9.3 mg/dL (ref 8.9–10.3)
Chloride: 94 mmol/L — ABNORMAL LOW (ref 98–111)
Creatinine, Ser: 1.08 mg/dL — ABNORMAL HIGH (ref 0.44–1.00)
GFR, Estimated: 59 mL/min — ABNORMAL LOW (ref >60)
Glucose, Bld: 250 mg/dL — ABNORMAL HIGH (ref 70–99)
Potassium: 4.6 mmol/L (ref 3.5–5.1)
Sodium: 125 mmol/L — ABNORMAL LOW (ref 135–145)
Total Bilirubin: 1.5 mg/dL — ABNORMAL HIGH (ref 0.0–1.2)
Total Protein: 6.7 g/dL (ref 6.5–8.1)

## 2023-10-02 LAB — GASTROINTESTINAL PANEL BY PCR, STOOL (REPLACES STOOL CULTURE)

## 2023-10-02 LAB — TROPONIN I (HIGH SENSITIVITY)
Troponin I (High Sensitivity): 8 ng/L (ref <18)
Troponin I (High Sensitivity): 7 ng/L (ref <18)

## 2023-10-02 LAB — C DIFFICILE QUICK SCREEN W PCR REFLEX
C Diff antigen: POSITIVE — AB
C Diff toxin: NEGATIVE

## 2023-10-02 LAB — CBC WITH DIFFERENTIAL/PLATELET
Abs Immature Granulocytes: 0.19 10*3/uL — ABNORMAL HIGH (ref 0.00–0.07)
Basophils Absolute: 0 10*3/uL (ref 0.0–0.1)
Basophils Relative: 1 %
Eosinophils Absolute: 0 10*3/uL (ref 0.0–0.5)
Eosinophils Relative: 1 %
HCT: 47.4 % — ABNORMAL HIGH (ref 36.0–46.0)
Hemoglobin: 15.9 g/dL — ABNORMAL HIGH (ref 12.0–15.0)
Immature Granulocytes: 19 %
Lymphocytes Relative: 15 %
Lymphs Abs: 0.2 10*3/uL — ABNORMAL LOW (ref 0.7–4.0)
MCH: 31.1 pg (ref 26.0–34.0)
MCHC: 33.5 g/dL (ref 30.0–36.0)
MCV: 92.6 fL (ref 80.0–100.0)
Monocytes Absolute: 0 10*3/uL — ABNORMAL LOW (ref 0.1–1.0)
Monocytes Relative: 1 %
Neutro Abs: 0.7 10*3/uL — ABNORMAL LOW (ref 1.7–7.7)
Neutrophils Relative %: 63 %
Platelets: 145 10*3/uL — ABNORMAL LOW (ref 150–400)
RBC: 5.12 MIL/uL — ABNORMAL HIGH (ref 3.87–5.11)
RDW: 14.4 % (ref 11.5–15.5)
Smear Review: UNDETERMINED
WBC: 1 10*3/uL — CL (ref 4.0–10.5)
nRBC: 0 % (ref 0.0–0.2)

## 2023-10-02 LAB — RESP PANEL BY RT-PCR (RSV, FLU A&B, COVID)  RVPGX2
Influenza A by PCR: NEGATIVE
Influenza B by PCR: NEGATIVE
Resp Syncytial Virus by PCR: NEGATIVE
SARS Coronavirus 2 by RT PCR: NEGATIVE

## 2023-10-02 LAB — LACTIC ACID, PLASMA
Lactic Acid, Venous: 3.3 mmol/L (ref 0.5–1.9)
Lactic Acid, Venous: 1.8 mmol/L (ref 0.5–1.9)

## 2023-10-02 LAB — LIPASE, BLOOD: Lipase: 45 U/L (ref 11–51)

## 2023-10-02 LAB — MAGNESIUM: Magnesium: 4.4 mg/dL — ABNORMAL HIGH (ref 1.7–2.4)

## 2023-10-02 LAB — CLOSTRIDIUM DIFFICILE BY PCR, REFLEXED: Toxigenic C. Difficile by PCR: POSITIVE — AB

## 2023-10-02 LAB — PROCALCITONIN: Procalcitonin: 1.45 ng/mL

## 2023-10-02 MED ORDER — VANCOMYCIN HCL IN DEXTROSE 1-5 GM/200ML-% IV SOLN: 1000.0000 mg | Freq: Once | INTRAVENOUS | Status: DC

## 2023-10-02 MED ORDER — SODIUM CHLORIDE 0.9 % IV SOLN
2.0000 g | Freq: Once | INTRAVENOUS | Status: AC
  Administered 2023-10-02: 2 g via INTRAVENOUS
  Filled 2023-10-02: qty 12.5

## 2023-10-02 MED ORDER — HYDROMORPHONE HCL 1 MG/ML IJ SOLN
0.5000 mg | Freq: Once | INTRAMUSCULAR | Status: AC
  Administered 2023-10-02: 0.5 mg via INTRAVENOUS
  Filled 2023-10-02: qty 0.5

## 2023-10-02 MED ORDER — VANCOMYCIN HCL IN DEXTROSE 1-5 GM/200ML-% IV SOLN
1000.0000 mg | Freq: Once | INTRAVENOUS | Status: AC
  Administered 2023-10-02: 1000 mg via INTRAVENOUS
  Filled 2023-10-02: qty 200

## 2023-10-02 MED ORDER — LACTATED RINGERS IV SOLN
150.0000 mL/h | INTRAVENOUS | Status: DC
  Administered 2023-10-02: 150 mL/h via INTRAVENOUS

## 2023-10-02 MED ORDER — ONDANSETRON HCL 4 MG PO TABS: 4.0000 mg | ORAL_TABLET | Freq: Four times a day (QID) | ORAL | Status: DC | PRN

## 2023-10-02 MED ORDER — VANCOMYCIN HCL 1250 MG/250ML IV SOLN: 1250.0000 mg | INTRAVENOUS | Status: DC

## 2023-10-02 MED ORDER — SODIUM CHLORIDE 0.9 % IV BOLUS: 1000.0000 mL | Freq: Once | INTRAVENOUS | Status: DC

## 2023-10-02 MED ORDER — SODIUM CHLORIDE 0.9 % IV SOLN
2.0000 g | Freq: Three times a day (TID) | INTRAVENOUS | Status: DC
  Administered 2023-10-03: 2 g via INTRAVENOUS
  Filled 2023-10-02: qty 12.5

## 2023-10-02 MED ORDER — NITROGLYCERIN 0.4 MG SL SUBL: 0.4000 mg | SUBLINGUAL_TABLET | SUBLINGUAL | Status: DC | PRN

## 2023-10-02 MED ORDER — ACETAMINOPHEN 325 MG PO TABS
650.0000 mg | ORAL_TABLET | Freq: Four times a day (QID) | ORAL | Status: DC | PRN
  Filled 2023-10-02: qty 2

## 2023-10-02 MED ORDER — METRONIDAZOLE 500 MG/100ML IV SOLN
500.0000 mg | Freq: Two times a day (BID) | INTRAVENOUS | Status: DC
  Administered 2023-10-03: 500 mg via INTRAVENOUS
  Filled 2023-10-02: qty 100

## 2023-10-02 MED ORDER — VANCOMYCIN HCL 1500 MG/300ML IV SOLN
1500.0000 mg | Freq: Once | INTRAVENOUS | Status: AC
  Administered 2023-10-02: 1500 mg via INTRAVENOUS
  Filled 2023-10-02: qty 300

## 2023-10-02 MED ORDER — INSULIN ASPART 100 UNIT/ML IJ SOLN: 0.0000 [IU] | Freq: Every day | INTRAMUSCULAR | Status: DC

## 2023-10-02 MED ORDER — HYDRALAZINE HCL 20 MG/ML IJ SOLN: 5.0000 mg | Freq: Four times a day (QID) | INTRAMUSCULAR | Status: DC | PRN

## 2023-10-02 MED ORDER — LIDOCAINE HCL (PF) 1 % IJ SOLN
5.0000 mL | Freq: Once | INTRAMUSCULAR | Status: AC
  Administered 2023-10-02: 5 mL
  Filled 2023-10-02: qty 5

## 2023-10-02 MED ORDER — OXYCODONE HCL 5 MG PO TABS
5.0000 mg | ORAL_TABLET | Freq: Three times a day (TID) | ORAL | Status: DC | PRN
  Administered 2023-10-03 – 2023-10-05 (×4): 5 mg via ORAL
  Filled 2023-10-02 (×5): qty 1

## 2023-10-02 MED ORDER — INSULIN ASPART 100 UNIT/ML IJ SOLN
0.0000 [IU] | Freq: Three times a day (TID) | INTRAMUSCULAR | Status: DC
  Administered 2023-10-03 – 2023-10-05 (×4): 2 [IU] via SUBCUTANEOUS
  Administered 2023-10-06: 1 [IU] via SUBCUTANEOUS
  Filled 2023-10-02 (×6): qty 1

## 2023-10-02 MED ORDER — METRONIDAZOLE 500 MG/100ML IV SOLN
500.0000 mg | Freq: Once | INTRAVENOUS | Status: AC
  Administered 2023-10-02: 500 mg via INTRAVENOUS
  Filled 2023-10-02: qty 100

## 2023-10-02 MED ORDER — HEPARIN SODIUM (PORCINE) 5000 UNIT/ML IJ SOLN
5000.0000 [IU] | Freq: Three times a day (TID) | INTRAMUSCULAR | Status: DC
  Administered 2023-10-03 – 2023-10-05 (×7): 5000 [IU] via SUBCUTANEOUS
  Filled 2023-10-02 (×7): qty 1

## 2023-10-02 MED ORDER — LACTATED RINGERS IV BOLUS (SEPSIS)
1000.0000 mL | Freq: Once | INTRAVENOUS | Status: AC
  Administered 2023-10-02: 1000 mL via INTRAVENOUS

## 2023-10-02 MED ORDER — THYROID 60 MG PO TABS
120.0000 mg | ORAL_TABLET | Freq: Every day | ORAL | Status: DC
  Administered 2023-10-03 – 2023-10-06 (×4): 120 mg via ORAL
  Filled 2023-10-02 (×5): qty 2

## 2023-10-02 MED ORDER — LACTATED RINGERS IV SOLN: INTRAVENOUS | Status: DC

## 2023-10-02 MED ORDER — ADULT MULTIVITAMIN W/MINERALS CH
1.0000 | ORAL_TABLET | Freq: Every day | ORAL | Status: DC
  Administered 2023-10-03 – 2023-10-06 (×4): 1 via ORAL
  Filled 2023-10-02 (×4): qty 1

## 2023-10-02 MED ORDER — ONDANSETRON HCL 4 MG/2ML IJ SOLN: 4.0000 mg | Freq: Four times a day (QID) | INTRAMUSCULAR | Status: DC | PRN

## 2023-10-02 MED ORDER — ACETAMINOPHEN 650 MG RE SUPP: 650.0000 mg | Freq: Four times a day (QID) | RECTAL | Status: DC | PRN

## 2023-10-02 NOTE — Assessment & Plan Note (Signed)
 Home nitroglycerin  as needed for chest pain resumed

## 2023-10-02 NOTE — H&P (Signed)
 History and Physical   Tonya [name] FMW:[id number] DOB: 1966-02-07 DOA: 10/02/2023  PCP: Cleotilde Oneil FALCON, MD  Outpatient Specialists: Dr. Melanee, medical oncology Patient coming from: home   I have personally briefly reviewed patient's old medical records in [facility] EMR.  Chief Concern: diarrhea, congestion, weakness  HPI: Tonya [name] is a 58 year old female with history of cervical cancer on chemotherapy, hypertension, GERD, non-insulin -dependent diabetes mellitus, hyperlipidemia, who presents emergency department for chief concerns of diarrhea, congestion, weakness.  Vitals in the ED showed temperature 97.8, respiration 18, heart rate 108, blood pressure 114/79, SpO2 99% on room air.  Serum sodium is 125, potassium 4.6, chloride 94, bicarb 16, BUN 46, serum creatinine 1.08, EGFR 59, WBC 1.0, hemoglobin 15.9, platelets of 145.  Lactic acid is 3.3.  High sensitive troponin is 8.  Magnesium  is 4.4.  ED treatment: Cefepime  2 g IV, vancomycin , LR 1 L bolus, sodium chloride  1 L bolus, Dilaudid  0.5 mg IV one-time dose, lidocaine  patch. -------------------------------- At bedside, patient is able to tell me her name, age, location, current calendar year.  She reports she just had profuse liquid bowel movements that started today.  She was taking a shower and had a shower chair and she just filled it up with stool every time she stood up, the stool just dripped out of her.  She reports the color of the stool is brown/yellow and watery.  Patient reports that she has been constipated and over the last 2 to 3 days, she has been drinking a lot of prune juice and taking MiraLAX .  She reports that over the last 3 weeks she has not been able to take in any solid food and she has only been doing a liquid diet.  She reports that her mouth has been so dry that she cannot produce any saliva therefore she has been drinking a lot of water .  She denies fever, nausea, vomiting, chest pain at home.  She  reports that she developed shortness of breath, prompting her to be come to the emergency department today.  She reports no recent antibiotic use.  Social history: She lives at home with her husband.  She denies tobacco, EtOH, recreational drug use.  She currently works as a programmer, multimedia.  ROS: Constitutional: no weight change, no fever ENT/Mouth: no sore throat, no rhinorrhea Eyes: no eye pain, no vision changes Cardiovascular: no chest pain, + dyspnea,  no edema, no palpitations Respiratory: no cough, no sputum, no wheezing Gastrointestinal: no nausea, no vomiting, + diarrhea, no constipation Genitourinary: no urinary incontinence, no dysuria, no hematuria Musculoskeletal: no arthralgias, no myalgias Skin: no skin lesions, no pruritus, Neuro: + weakness, no loss of consciousness, no syncope Psych: no anxiety, no depression, + decrease appetite Heme/Lymph: no bruising, no bleeding  ED Course: Discussed with EDP, patient requiring hospitalization for chief concerns of sepsis.  Assessment/Plan  Principal Problem:   Severe sepsis with acute organ dysfunction (HCC) Active Problems:   Hypothyroidism (acquired)   Ischemic cardiomyopathy   Essential hypertension   Hyperlipidemia associated with type 2 diabetes mellitus (HCC)   Morbid obesity (HCC)   Diabetes mellitus (HCC)   Coronary artery disease involving native coronary artery of native heart with angina pectoris (HCC)   Endometrial carcinoma (HCC)   Diarrhea   Leukopenia   Assessment and Plan:  * Severe sepsis with acute organ dysfunction (HCC) Blood cultures x 2 are in process Agreed with EDP to check a UA Second lactic acid improved to 1.8 Check C.  difficile and GI panel Recheck CBC with differential in the a.m. Continue with metronidazole  500 mg IV daily, vancomycin  and cefepime  per pharmacy  Leukopenia Secondary to severe sepsis Recheck CBC in the a.m. AM team to consult medical oncology/hematology,  pending repeat CBC  Diarrhea Check C. difficile screening and GI panel No antidiarrheal medication will be given until infectious diarrhea has been ruled out  Coronary artery disease involving native coronary artery of native heart with angina pectoris (HCC) Home nitroglycerin  as needed for chest pain resumed  Diabetes mellitus (HCC) Insulin  SSI with at bedtime coverage ordered on admission CBG monitoring: 3 times daily AC and at bedtime Goal inpatient blood glucose levels 140-180  Morbid obesity (HCC) This complicates overall care and prognosis.   Essential hypertension Hydralazine  5 mg IV every 6 hours as needed for SBP under 165, 5 days ordered  Hypothyroidism (acquired) Home thyroid  Armour 125 mg daily before breakfast resumed  Chart reviewed.   DVT prophylaxis: Heparin  5000 units subcutaneous every 8 hours Code Status: Full code Diet: Heart healthy/carb modified Family Communication: A phone call was offered, patient declined stating that her husband knows she is being admitted to the hospital and will come to the hospital once he finds out a bed for her. Disposition Plan: Pending clinical course Consults called: Pharmacy Admission status: PCU, observation  Past Medical History:  Diagnosis Date   Cancer (HCC)    Coronary artery disease    GERD (gastroesophageal reflux disease)    Hypertension    Hypothyroidism    Myocardial infarction (HCC)    Thyroid  disease    Past Surgical History:  Procedure Laterality Date   CARDIAC CATHETERIZATION     CESAREAN SECTION     CORONARY STENT INTERVENTION N/A 07/24/2019   Procedure: CORONARY STENT INTERVENTION;  Surgeon: Mady Bruckner, MD;  Location: [facility] INVASIVE CV LAB;  Service: Cardiovascular;  Laterality: N/A;  RCA & CFX   IR IMAGING GUIDED PORT INSERTION  09/23/2023   LEFT HEART CATH AND CORONARY ANGIOGRAPHY N/A 07/24/2019   Procedure: LEFT HEART CATH AND CORONARY ANGIOGRAPHY;  Surgeon: Hester Wolm PARAS, MD;  Location:  [facility] INVASIVE CV LAB;  Service: Cardiovascular;  Laterality: N/A;   TONSILLECTOMY     Social History:  reports that she has never smoked. She has never used smokeless tobacco. She reports that she does not currently use alcohol . She reports that she does not use drugs.  No Known Allergies Family History  Problem Relation Age of Onset   Atrial fibrillation Mother        Passed January 2024.   Colon cancer Mother        Diagnosed 2007   Heart Problems Father    Heart attack Father    Heart disease Father    Family history: Family history reviewed and not pertinent.  Prior to Admission medications   Medication Sig Start Date End Date Taking? Authorizing Provider  aspirin  EC 81 MG tablet Take 81 mg by mouth daily. Patient not taking: Reported on 09/21/2023 05/18/22   [provider]  bisacodyl  (DULCOLAX) 10 MG suppository Place rectally. 09/19/23 10/09/23  [provider]  Cholecalciferol (VITAMIN D-1000 MAX ST) 25 MCG (1000 UT) tablet Take by mouth. 05/25/23   [provider]  dexamethasone  (DECADRON ) 4 MG tablet Take 2 tabs by mouth starting day after last dose of etoposide  for one day only. Repeat every 21 days. Take with food. 09/21/23   Melanee Annah BROCKS, MD  JARDIANCE 10 MG TABS tablet Take 10  mg by mouth daily. Patient not taking: Reported on 09/21/2023    [provider]  lidocaine -prilocaine  (EMLA ) cream Apply to affected area once 09/21/23   Melanee Annah BROCKS, MD  losartan  (COZAAR ) 25 MG tablet Take 1 tablet (25 mg total) by mouth daily. 07/23/22 09/23/23  End, Lonni, MD  Multiple Vitamin (MULTI-VITAMIN) tablet Take 1 tablet by mouth daily.    [provider]  nitroGLYCERIN  (NITROSTAT ) 0.4 MG SL tablet Place 1 tablet (0.4 mg total) under the tongue every 5 (five) minutes as needed for chest pain. Patient not taking: Reported on 09/21/2023 07/23/22 10/21/22  End, Lonni, MD  ondansetron  (ZOFRAN ) 8 MG tablet Take 1 tablet (8 mg total) by mouth  every 8 (eight) hours as needed for nausea, vomiting or refractory nausea / vomiting. Start on the third day after carboplatin . 09/21/23   Melanee Annah BROCKS, MD  oxyCODONE  (OXY IR/ROXICODONE ) 5 MG immediate release tablet Take by mouth. 09/19/23 10/19/23  [provider]  OZEMPIC, 0.25 OR 0.5 MG/DOSE, 2 MG/3ML SOPN Inject 0.5 mg into the skin once a week. Patient not taking: Reported on 09/21/2023    [provider]  prochlorperazine  (COMPAZINE ) 10 MG tablet Take 1 tablet (10 mg total) by mouth every 6 (six) hours as needed for nausea or vomiting. 09/21/23   Melanee Annah BROCKS, MD  rosuvastatin (CRESTOR) 10 MG tablet Take 10 mg by mouth daily. Patient not taking: Reported on 09/21/2023 05/18/22 05/18/23  [provider]  thyroid  (ARMOUR) 120 MG tablet Take 120 mg by mouth daily before breakfast.    [provider]    Physical Exam: Vitals:   10/02/23 1516  BP: 114/79  Pulse: (!) 108  Resp: 18  Temp: 97.8 F (36.6 C)  TempSrc: Oral  SpO2: 99%   Constitutional: appears age-appropriate, frail, NAD , calm Eyes: PERRL, lids and conjunctivae normal ENMT: Mucous membranes are moist. Posterior pharynx clear of any exudate or lesions. Age-appropriate dentition. Hearing appropriate Neck: normal, supple, no masses, no thyromegaly Respiratory: clear to auscultation bilaterally, no wheezing, no crackles. Normal respiratory effort. No accessory muscle use.  Cardiovascular: Regular rate and rhythm, no murmurs / rubs / gallops. No extremity edema. 2+ pedal pulses. No carotid bruits.  Abdomen: Morbidly obese abdomen, no tenderness, no masses palpated, no hepatosplenomegaly.  Increased bowel sounds.  Musculoskeletal: no clubbing / cyanosis. No joint deformity upper and lower extremities. Good ROM, no contractures, no atrophy. Normal muscle tone.  Skin: no rashes, lesions, ulcers. No induration Neurologic: Sensation intact. Strength 5/5 in all 4.  Psychiatric: Normal judgment and  insight. Alert and oriented x 3. Normal mood.   EKG: Ordered, pending completion  Chest x-ray on Admission: I personally reviewed and I agree with radiologist reading as below.  DG Chest Port 1 View Result Date: 10/02/2023 CLINICAL DATA:  Diarrhea. Recently started chemotherapy for cervical cancer. EXAM: PORTABLE CHEST 1 VIEW COMPARISON:  Chest x-ray dated July 23, 2019. FINDINGS: Right chest [name] port catheter with tip at the cavoatrial junction. The heart size and mediastinal contours are within normal limits. Normal pulmonary vascularity. No focal consolidation, pleural effusion, or pneumothorax. No acute osseous abnormality. IMPRESSION: No active disease. Electronically Signed   By: Elsie ONEIDA Shoulder M.D.   On: 10/02/2023 16:14   Labs on Admission: I have personally reviewed following labs  CBC: Recent Labs  Lab 09/26/23 0824 10/02/23 1613  WBC 18.4* 1.0*  NEUTROABS 15.5* 0.7*  HGB 14.2 15.9*  HCT 40.8 47.4*  MCV 89.7 92.6  PLT  460* 145*   Basic Metabolic Panel: Recent Labs  Lab 09/26/23 0824 10/02/23 1536  NA 121* 125*  K 4.2 4.6  CL 86* 94*  CO2 21* 16*  GLUCOSE 138* 250*  BUN 43* 46*  CREATININE 1.39* 1.08*  CALCIUM  9.5 9.3  MG  --  4.4*   GFR: Estimated Creatinine Clearance: 71.6 mL/min (A) (by C-G formula based on SCr of 1.08 mg/dL (H)).  Liver Function Tests: Recent Labs  Lab 09/26/23 0824 10/02/23 1536  AST 59* 43*  ALT 15 24  ALKPHOS 94 106  BILITOT 1.2 1.5*  PROT 7.3 6.7  ALBUMIN  3.1* 3.0*   Recent Labs  Lab 10/02/23 1536  LIPASE 45   This document was prepared using Dragon Voice Recognition software and may include unintentional dictation errors.  Dr. Sherre [facility]  If 7PM-7AM, please contact overnight-coverage provider If 7AM-7PM, please contact day attending provider [email or web address]  10/02/2023, 7:19 PM

## 2023-10-02 NOTE — ED Notes (Signed)
 This NT and [name] (NT) went to answer pt call light due to iv pump beeping. While in pt room this nt observed a brown spot on the floor. Pt stated  I have runny diarrhea and I do not want to get cleaned because its going to continue to leak and I would like to wait this NT offered pt again to be sure and she declined.

## 2023-10-02 NOTE — Assessment & Plan Note (Signed)
 Hydralazine  5 mg IV every 6 hours as needed for SBP under 165, 5 days ordered

## 2023-10-02 NOTE — Assessment & Plan Note (Signed)
 -  This complicates overall care and prognosis.

## 2023-10-02 NOTE — Hospital Course (Addendum)
 [name] is a 58 year old female with history of cervical cancer on chemotherapy, hypertension, GERD, non-insulin -dependent diabetes mellitus, hyperlipidemia, who presents emergency department for chief concerns of diarrhea, congestion, weakness. She meets sepsis criteria with tachycardia and significant leukocytosis, lactic acid 3.3.  Patient also had hyponatremia, metabolic acidosis.  Patient was given fluids, she also received broad-spectrum antibiotics.  Stool study came back with C. difficile toxin positive.  Treated with IV Flagyl  and oral vancomycin .  Patient also developed neutropenia, oncology consult obtained.  Blood culture in 1 bottle positive for Streptococcus, ID consult also obtained.  Patient is placed on IV Rocephin .

## 2023-10-02 NOTE — Progress Notes (Signed)
 Pharmacy Antibiotic Note  Tonya [name] is a 58 y.o. female admitted on 10/02/2023 with sepsis. Patient presenting with diarrhea. PMH significant for cervical cancer on chemotherapy, hypertension, GERD, non-insulin -dependent diabetes mellitus, hyperlipidemia. In ED, patient is afebrile with WBC 1.0. Pharmacy has been consulted for vancomycin  and cefepime  dosing.  Plan: Start cefepime  2 g IV every 8 hours Give vancomycin  load of 2500 mg IV x1 Start vancomycin  1250 mg IV every 24 hours (eAUC 466.4, Scr 1.08, TBW used, Vd 0.5 L/kg) Monitor renal function, clinical status, culture data, and LOT    Temp (24hrs), Avg:97.8 F (36.6 C), Min:97.8 F (36.6 C), Max:97.8 F (36.6 C)  Recent Labs  Lab 09/26/23 0824 10/02/23 1536 10/02/23 1613  WBC 18.4*  --  1.0*  CREATININE 1.39* 1.08*  --   LATICACIDVEN  --  3.3*  --     Estimated Creatinine Clearance: 71.6 mL/min (A) (by C-G formula based on SCr of 1.08 mg/dL (H)).    No Known Allergies  Antimicrobials this admission: vancomycin  2/9 >>  cefepime  2/9 >>  Metronidazole  2/9 >>  Dose adjustments this admission: N/A  Microbiology results: 2/9 BCx: pending  Thank you for involving pharmacy in this patient's care.   Damien Napoleon, PharmD Clinical Pharmacist 10/02/2023 6:14 PM

## 2023-10-02 NOTE — Assessment & Plan Note (Addendum)
 Check C. difficile screening and GI panel No antidiarrheal medication will be given until infectious diarrhea has been ruled out

## 2023-10-02 NOTE — ED Triage Notes (Signed)
 Pt comes via EMS from home with c/o diarrhea. Pt has chemo on Monday this past week. Pt has MID 3 yrs ago. Pt little tachy with EMS.  Pt has dx of cervical cancer 3 weeks ago 134/85 CBG 218 100% RA HR 114

## 2023-10-02 NOTE — Assessment & Plan Note (Signed)
 Blood cultures x 2 are in process Agreed with EDP to check a UA Second lactic acid improved to 1.8 Check C. difficile and GI panel Recheck CBC with differential in the a.m. Continue with metronidazole  500 mg IV daily, vancomycin  and cefepime  per pharmacy

## 2023-10-02 NOTE — Assessment & Plan Note (Signed)
 Insulin  SSI with at bedtime coverage ordered on admission CBG monitoring: 3 times daily AC and at bedtime Goal inpatient blood glucose levels 140-180

## 2023-10-02 NOTE — Progress Notes (Signed)
 CODE SEPSIS - PHARMACY COMMUNICATION  **Broad Spectrum Antibiotics should be administered within 1 hour of Sepsis diagnosis**  Time Code Sepsis Called/Page Received: 1753  Antibiotics Ordered: vancomycin , cefepime , and metronidazole   Time of 1st antibiotic administration: 1758  Additional action taken by pharmacy: None  If necessary, Name of Provider/Nurse Contacted: None    Damien Napoleon ,PharmD Clinical Pharmacist  10/02/2023  6:17 PM

## 2023-10-02 NOTE — Assessment & Plan Note (Addendum)
 Secondary to severe sepsis Recheck CBC in the a.m. AM team to consult medical oncology/hematology, pending repeat CBC

## 2023-10-02 NOTE — ED Triage Notes (Signed)
 Pt arrives via EMS for complaint of diarrhea. Pt started chemotherapy on Monday and had it for three days (M/T/W). Pt also complaining of weakness.

## 2023-10-02 NOTE — ED Notes (Signed)
Lab called to get second set of blood cultures

## 2023-10-02 NOTE — Assessment & Plan Note (Signed)
 Home thyroid  Armour 125 mg daily before breakfast resumed

## 2023-10-02 NOTE — ED Provider Notes (Signed)
 [facility] Provider Note    Event Date/Time   First MD Initiated Contact with Patient 10/02/23 [phone]     (approximate)   History   Diarrhea   HPI  Tonya [name] is a 58 y.o. female with a history of endometrial/cervical cancer, CAD, GERD, hypertension, hypothyroidism, and type 2 diabetes who presents with generalized weakness.  The patient states that she received chemotherapy last week, most recently 4 days ago.  Over the last several days the patient has become increasingly weak.  She states that she feels very short of breath and lightheaded with minimal activity such as trying to get up out of bed or walk to the bathroom.  She has had decreased p.o. intake for some time, and states that she has mainly been taking Ensure and electrolyte drinks as well as some water .  She has not been able to eat any solid food.  She denies any abdominal pain.  She does report some chronic pelvic pain related to the cancer that is unchanged today.  She was constipated previously and had been taking MiraLAX , but states that today she started having profuse diarrhea which is nonbloody.  She has no fever.  I reviewed the past medical records.  The patient was most recently evaluated by Dr. Melanee from oncology on 2/3 for follow-up prior to chemotherapy.  She was evaluated by palliative medicine on 2/5.   Physical Exam   Triage Vital Signs: ED Triage Vitals [10/02/23 1514]  Encounter Vitals Group     BP      Systolic BP Percentile      Diastolic BP Percentile      Pulse      Resp      Temp      Temp src      SpO2      Weight      Height      Head Circumference      Peak Flow      Pain Score 0     Pain Loc      Pain Education      Exclude from Growth Chart     Most recent vital signs: Vitals:   10/02/23 1516  BP: 114/79  Pulse: (!) 108  Resp: 18  Temp: 97.8 F (36.6 C)  SpO2: 99%     General: Alert, weak appearing, no distress.  CV:  Good peripheral  perfusion.  Resp:  Normal effort.  Lungs CTAB. Abd:  Mild distention.  Soft with no focal tenderness. Other:  1+ bilateral lower extremity edema.  Dry mucous membranes.   ED Results / Procedures / Treatments   Labs (all labs ordered are listed, but only abnormal results are displayed) Labs Reviewed  C DIFFICILE QUICK SCREEN W PCR REFLEX   - Abnormal; Notable for the following components:      Result Value   C Diff antigen POSITIVE (*)    All other components within normal limits  COMPREHENSIVE METABOLIC PANEL - Abnormal; Notable for the following components:   Sodium 125 (*)    Chloride 94 (*)    CO2 16 (*)    Glucose, Bld 250 (*)    BUN 46 (*)    Creatinine, Ser 1.08 (*)    Albumin  3.0 (*)    AST 43 (*)    Total Bilirubin 1.5 (*)    GFR, Estimated 59 (*)    All other components within normal limits  LACTIC ACID, PLASMA - Abnormal; Notable for  the following components:   Lactic Acid, Venous 3.3 (*)    All other components within normal limits  MAGNESIUM  - Abnormal; Notable for the following components:   Magnesium  4.4 (*)    All other components within normal limits  CBC WITH DIFFERENTIAL/PLATELET - Abnormal; Notable for the following components:   WBC 1.0 (*)    RBC 5.12 (*)    Hemoglobin 15.9 (*)    HCT 47.4 (*)    Platelets 145 (*)    Neutro Abs 0.7 (*)    Lymphs Abs 0.2 (*)    Monocytes Absolute 0.0 (*)    Abs Immature Granulocytes 0.19 (*)    All other components within normal limits  RESP PANEL BY RT-PCR (RSV, FLU A&B, COVID)  RVPGX2  CULTURE, BLOOD (ROUTINE X 2)  CULTURE, BLOOD (ROUTINE X 2)  GASTROINTESTINAL PANEL BY PCR, STOOL (REPLACES STOOL CULTURE)  CLOSTRIDIUM DIFFICILE BY PCR, REFLEXED  LACTIC ACID, PLASMA  LIPASE, BLOOD  CBC WITH DIFFERENTIAL/PLATELET  URINALYSIS, ROUTINE W REFLEX MICROSCOPIC  BASIC METABOLIC PANEL  PROCALCITONIN  HEMOGLOBIN A1C  CBC WITH DIFFERENTIAL/PLATELET  TROPONIN I (HIGH SENSITIVITY)  TROPONIN I (HIGH SENSITIVITY)      EKG     RADIOLOGY  Chest x-ray: I independently viewed and interpreted the images; there is no focal consolidation or edema  PROCEDURES:  Critical Care performed: No  Procedures   MEDICATIONS ORDERED IN ED: Medications  lactated ringers  infusion (150 mL/hr Intravenous New Bag/Given 10/02/23 1845)  acetaminophen  (TYLENOL ) tablet 650 mg (has no administration in time range)    Or  acetaminophen  (TYLENOL ) suppository 650 mg (has no administration in time range)  ondansetron  (ZOFRAN ) tablet 4 mg (has no administration in time range)    Or  ondansetron  (ZOFRAN ) injection 4 mg (has no administration in time range)  heparin  injection 5,000 Units (has no administration in time range)  metroNIDAZOLE  (FLAGYL ) IVPB 500 mg (has no administration in time range)  vancomycin  (VANCOREADY) IVPB 1500 mg/300 mL (1,500 mg Intravenous New Bag/Given 10/02/23 1847)    And  vancomycin  (VANCOCIN ) IVPB 1000 mg/200 mL premix (has no administration in time range)  insulin  aspart (novoLOG ) injection 0-5 Units (has no administration in time range)  insulin  aspart (novoLOG ) injection 0-15 Units (has no administration in time range)  vancomycin  (VANCOREADY) IVPB 1250 mg/250 mL (has no administration in time range)  ceFEPIme  (MAXIPIME ) 2 g in sodium chloride  0.9 % 100 mL IVPB (has no administration in time range)  nitroGLYCERIN  (NITROSTAT ) SL tablet 0.4 mg (has no administration in time range)  thyroid  (ARMOUR) tablet 120 mg (has no administration in time range)  multivitamin with minerals tablet 1 tablet (has no administration in time range)  hydrALAZINE  (APRESOLINE ) injection 5 mg (has no administration in time range)  oxyCODONE  (Oxy IR/ROXICODONE ) immediate release tablet 5 mg (has no administration in time range)  lidocaine  (PF) (XYLOCAINE ) 1 % injection 5 mL (5 mLs Other Given 10/02/23 1638)  lactated ringers  bolus 1,000 mL ( Intravenous Restarted 10/02/23 1757)  ceFEPIme  (MAXIPIME ) 2 g in sodium  chloride 0.9 % 100 mL IVPB (0 g Intravenous Stopped 10/02/23 1755)  metroNIDAZOLE  (FLAGYL ) IVPB 500 mg (500 mg Intravenous New Bag/Given 10/02/23 1758)  HYDROmorphone  (DILAUDID ) injection 0.5 mg (0.5 mg Intravenous Given 10/02/23 1753)     IMPRESSION / MDM / ASSESSMENT AND PLAN / ED COURSE  I reviewed the triage vital signs and the nursing notes.  58 year old female with PMH as noted above presents with worsening generalized weakness over the last several  days associated with shortness of breath on minimal exertion, lightheadedness, decreased p.o. intake, and diarrhea.  Clinically the patient does appear dehydrated.  Her vital signs are normal except for mild tachycardia.  Differential diagnosis includes, but is not limited to, chemotherapy side effect, dehydration, electrolyte abnormality, AKI, other metabolic cause, UTI, pneumonia, viral syndrome, or other acute infection, less likely primary cardiac etiology.  We will obtain chest x-ray, lab workup, give fluids, and reassess.  Patient's presentation is most consistent with acute presentation with potential threat to life or bodily function.  The patient is on the cardiac monitor to evaluate for evidence of arrhythmia and/or significant heart rate changes.  ----------------------------------------- 5:51 PM on 10/02/2023 -----------------------------------------  Lab workup is significant for WBC count of 1.0.  Lactate is elevated as well.  Sodium is low.  Lab findings are suggestive of acute infection/sepsis.  I have ordered broad-spectrum antibiotics to cover for an unknown source, per the sepsis protocol, as well as fluids.  There is no evidence of septic shock.  The patient will need admission for further management.  I consulted Dr. Sherre from the hospitalist service; based on our discussion she agrees to evaluate the patient for admission.   FINAL CLINICAL IMPRESSION(S) / ED DIAGNOSES   Final diagnoses:  Sepsis, due to unspecified  organism, unspecified whether acute organ dysfunction present [facility])     Rx / DC Orders   ED Discharge Orders     None        Note:  This document was prepared using Dragon voice recognition software and may include unintentional dictation errors.    Jacolyn Pae, MD 10/02/23 KENITH

## 2023-10-03 ENCOUNTER — Encounter: Payer: Self-pay | Admitting: Internal Medicine

## 2023-10-03 ENCOUNTER — Telehealth: Payer: Self-pay | Admitting: *Deleted

## 2023-10-03 DIAGNOSIS — E785 Hyperlipidemia, unspecified: Secondary | ICD-10-CM | POA: Diagnosis present

## 2023-10-03 DIAGNOSIS — E876 Hypokalemia: Secondary | ICD-10-CM | POA: Diagnosis present

## 2023-10-03 DIAGNOSIS — Z794 Long term (current) use of insulin: Secondary | ICD-10-CM | POA: Diagnosis not present

## 2023-10-03 DIAGNOSIS — C786 Secondary malignant neoplasm of retroperitoneum and peritoneum: Secondary | ICD-10-CM | POA: Diagnosis present

## 2023-10-03 DIAGNOSIS — C775 Secondary and unspecified malignant neoplasm of intrapelvic lymph nodes: Secondary | ICD-10-CM | POA: Diagnosis present

## 2023-10-03 DIAGNOSIS — I10 Essential (primary) hypertension: Secondary | ICD-10-CM | POA: Diagnosis present

## 2023-10-03 DIAGNOSIS — R7881 Bacteremia: Secondary | ICD-10-CM | POA: Diagnosis not present

## 2023-10-03 DIAGNOSIS — D701 Agranulocytosis secondary to cancer chemotherapy: Secondary | ICD-10-CM | POA: Diagnosis present

## 2023-10-03 DIAGNOSIS — C541 Malignant neoplasm of endometrium: Secondary | ICD-10-CM

## 2023-10-03 DIAGNOSIS — R197 Diarrhea, unspecified: Secondary | ICD-10-CM | POA: Diagnosis not present

## 2023-10-03 DIAGNOSIS — A409 Streptococcal sepsis, unspecified: Secondary | ICD-10-CM | POA: Diagnosis present

## 2023-10-03 DIAGNOSIS — E872 Acidosis, unspecified: Secondary | ICD-10-CM | POA: Diagnosis present

## 2023-10-03 DIAGNOSIS — N179 Acute kidney failure, unspecified: Secondary | ICD-10-CM | POA: Diagnosis present

## 2023-10-03 DIAGNOSIS — A0472 Enterocolitis due to Clostridium difficile, not specified as recurrent: Secondary | ICD-10-CM

## 2023-10-03 DIAGNOSIS — E039 Hypothyroidism, unspecified: Secondary | ICD-10-CM | POA: Diagnosis present

## 2023-10-03 DIAGNOSIS — D61810 Antineoplastic chemotherapy induced pancytopenia: Secondary | ICD-10-CM | POA: Diagnosis not present

## 2023-10-03 DIAGNOSIS — I255 Ischemic cardiomyopathy: Secondary | ICD-10-CM | POA: Diagnosis present

## 2023-10-03 DIAGNOSIS — R6520 Severe sepsis without septic shock: Secondary | ICD-10-CM | POA: Diagnosis present

## 2023-10-03 DIAGNOSIS — E871 Hypo-osmolality and hyponatremia: Secondary | ICD-10-CM | POA: Insufficient documentation

## 2023-10-03 DIAGNOSIS — E222 Syndrome of inappropriate secretion of antidiuretic hormone: Secondary | ICD-10-CM | POA: Diagnosis present

## 2023-10-03 DIAGNOSIS — A419 Sepsis, unspecified organism: Secondary | ICD-10-CM | POA: Diagnosis not present

## 2023-10-03 DIAGNOSIS — N1330 Unspecified hydronephrosis: Secondary | ICD-10-CM | POA: Diagnosis present

## 2023-10-03 DIAGNOSIS — E1169 Type 2 diabetes mellitus with other specified complication: Secondary | ICD-10-CM | POA: Diagnosis present

## 2023-10-03 DIAGNOSIS — T451X5A Adverse effect of antineoplastic and immunosuppressive drugs, initial encounter: Secondary | ICD-10-CM

## 2023-10-03 DIAGNOSIS — D709 Neutropenia, unspecified: Secondary | ICD-10-CM | POA: Insufficient documentation

## 2023-10-03 DIAGNOSIS — D6959 Other secondary thrombocytopenia: Secondary | ICD-10-CM | POA: Diagnosis present

## 2023-10-03 DIAGNOSIS — C7A1 Malignant poorly differentiated neuroendocrine tumors: Secondary | ICD-10-CM | POA: Diagnosis present

## 2023-10-03 DIAGNOSIS — B955 Unspecified streptococcus as the cause of diseases classified elsewhere: Secondary | ICD-10-CM

## 2023-10-03 DIAGNOSIS — Z6841 Body Mass Index (BMI) 40.0 and over, adult: Secondary | ICD-10-CM | POA: Diagnosis not present

## 2023-10-03 LAB — BLOOD CULTURE ID PANEL (REFLEXED) - BCID2

## 2023-10-03 LAB — BASIC METABOLIC PANEL WITH GFR
Anion gap: 10 (ref 5–15)
BUN: 47 mg/dL — ABNORMAL HIGH (ref 6–20)
CO2: 20 mmol/L — ABNORMAL LOW (ref 22–32)
Calcium: 9.1 mg/dL (ref 8.9–10.3)
Chloride: 96 mmol/L — ABNORMAL LOW (ref 98–111)
Creatinine, Ser: 0.92 mg/dL (ref 0.44–1.00)
GFR, Estimated: >60 mL/min
Glucose, Bld: 155 mg/dL — ABNORMAL HIGH (ref 70–99)
Potassium: 4.2 mmol/L (ref 3.5–5.1)
Sodium: 126 mmol/L — ABNORMAL LOW (ref 135–145)

## 2023-10-03 LAB — CBC WITH DIFFERENTIAL/PLATELET
Abs Immature Granulocytes: 0 K/uL (ref 0.00–0.07)
Basophils Absolute: 0 K/uL (ref 0.0–0.1)
Basophils Relative: 2 %
Eosinophils Absolute: 0 K/uL (ref 0.0–0.5)
Eosinophils Relative: 2 %
HCT: 38.2 % (ref 36.0–46.0)
Hemoglobin: 13.4 g/dL (ref 12.0–15.0)
Immature Granulocytes: 0 %
Lymphocytes Relative: 80 %
Lymphs Abs: 0.4 K/uL — ABNORMAL LOW (ref 0.7–4.0)
MCH: 30.7 pg (ref 26.0–34.0)
MCHC: 35.1 g/dL (ref 30.0–36.0)
MCV: 87.6 fL (ref 80.0–100.0)
Monocytes Absolute: 0 K/uL — ABNORMAL LOW (ref 0.1–1.0)
Monocytes Relative: 6 %
Neutro Abs: 0.1 K/uL — CL (ref 1.7–7.7)
Neutrophils Relative %: 10 %
Platelets: 138 K/uL — ABNORMAL LOW (ref 150–400)
RBC: 4.36 MIL/uL (ref 3.87–5.11)
RDW: 14.3 % (ref 11.5–15.5)
Smear Review: NORMAL
WBC: 0.5 K/uL — CL (ref 4.0–10.5)
nRBC: 0 % (ref 0.0–0.2)

## 2023-10-03 LAB — CBG MONITORING, ED
Glucose-Capillary: 195 mg/dL — ABNORMAL HIGH (ref 70–99)
Glucose-Capillary: 148 mg/dL — ABNORMAL HIGH (ref 70–99)

## 2023-10-03 LAB — GLUCOSE, CAPILLARY
Glucose-Capillary: 135 mg/dL — ABNORMAL HIGH (ref 70–99)
Glucose-Capillary: 114 mg/dL — ABNORMAL HIGH (ref 70–99)
Glucose-Capillary: 114 mg/dL — ABNORMAL HIGH (ref 70–99)

## 2023-10-03 MED ORDER — CHLORHEXIDINE GLUCONATE CLOTH 2 % EX PADS
6.0000 | MEDICATED_PAD | Freq: Every day | CUTANEOUS | Status: DC
  Administered 2023-10-03 – 2023-10-06 (×4): 6 via TOPICAL

## 2023-10-03 MED ORDER — VANCOMYCIN HCL 250 MG PO CAPS
500.0000 mg | ORAL_CAPSULE | Freq: Four times a day (QID) | ORAL | Status: DC
  Administered 2023-10-03 – 2023-10-05 (×8): 500 mg via ORAL
  Filled 2023-10-03 (×13): qty 2

## 2023-10-03 MED ORDER — SODIUM CHLORIDE 0.9 % IV SOLN
2.0000 g | Freq: Every day | INTRAVENOUS | Status: DC
  Administered 2023-10-03 – 2023-10-06 (×4): 2 g via INTRAVENOUS
  Filled 2023-10-03 (×4): qty 20

## 2023-10-03 MED ORDER — STERILE WATER FOR INJECTION IV SOLN
INTRAVENOUS | Status: DC
  Filled 2023-10-03 (×2): qty 1000
  Filled 2023-10-03: qty 150

## 2023-10-03 MED ORDER — METRONIDAZOLE 500 MG/100ML IV SOLN
500.0000 mg | Freq: Three times a day (TID) | INTRAVENOUS | Status: DC
  Administered 2023-10-03 – 2023-10-04 (×4): 500 mg via INTRAVENOUS
  Filled 2023-10-03 (×5): qty 100

## 2023-10-03 NOTE — Consult Note (Signed)
 NAME: Tonya [name]  DOB: 05-04-1966  MRN: [id number]  Date/Time: 10/03/2023 11:52 AM  REQUESTING PROVIDER: Dr.Zhang Subjective:  REASON FOR CONSULT: streptococcus bacteremia ? Tonya [name] is a 58 y.o. 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  It was thought to be due to Mounjaro and she was given laxatives.  On August 29, 2023 when she saw her PCP and the pain was worsening he sent her for a stat CT scan.  And that showed extensive abdominal and retroperitoneal adenopathy, uterine enlargement and suggestion of soft tissue fullness within the lower uterine segment and cervix.  There was periuterine interstitial thickening.  Omental nodularity was noted.  Peritoneal thickening was noted.  The impression was widespread metastatic disease including to the abdominal pelvic and lower thoracic nodes omentum and lungs.  Mild bilateral hydronephrosis and proximal hydroureter.  She was referred to GYN oncology and saw them on 09/01/2023 and underwent pelvic examination and biopsies and Pap smear.  But that was limited.  The cervical biopsy was consistent with squamous mucosa and abundant necrotic debris but not definitely diagnostic for malignancy.  FNA of the retroperitoneal lymph node was done on 09/13/23 and showed poorly differentiated carcinoma with neuroendocrine features.  She saw oncology Dr. Randy Buttery on 09/21/2023.  Port was placed on 09/23/2023 by interventional radiology..  Patient got her first chemo on 09/26/2023. 09/27/23 and 09/28/23 She received carboplatin  and etoposide  and also received Decadron ,  She got pegfilgrastim  on 09/30/23  Patient presented to the ED on 10/02/2023 with diarrhea, weakness and  congestion. She took miralax  and prune juice for constipation over the previous few days She is not able to eat. Mouth sore and dry She has some shortness of breath which made her come to ED  In the ED vitals  10/02/23 15:16  BP 114/79  Temp 97.8 F (36.6 C)  Pulse Rate 108 !  Resp 18  SpO2 99 %    Latest Reference Range & Units 10/02/23 16:13  WBC 4.0 - 10.5 K/uL 1.0 (LL) [1]  Hemoglobin 12.0 - 15.0 g/dL 40.9 (H)  HCT 81.1 - 91.4 % 47.4 (H)  Platelets 150 - 400 K/uL 145 (L)  Blood culture was sent And it is showing Streptococcus and I am seeing the patient Pt says she has abdominal apin , and it is worse when she lies down and has bene the same since her diagnosis- She sleeps in a recliner   Past Medical History:  Diagnosis Date   Cancer (HCC)    Coronary artery disease    GERD (gastroesophageal reflux disease)    Hypertension    Hypothyroidism    Myocardial infarction (HCC)    Thyroid  disease     Past Surgical History:  Procedure Laterality Date   CARDIAC CATHETERIZATION     CESAREAN SECTION     CORONARY STENT INTERVENTION N/A 07/24/2019   Procedure: CORONARY STENT INTERVENTION;  Surgeon: Sammy Crisp, MD;  Location: [facility];  Service: Cardiovascular;  Laterality: N/A;  RCA & CFX   IR IMAGING GUIDED PORT INSERTION  09/23/2023   LEFT HEART CATH AND CORONARY ANGIOGRAPHY N/A 07/24/2019   Procedure: LEFT HEART CATH AND CORONARY ANGIOGRAPHY;  Surgeon: Michelle Aid, MD;  Location: [facility];  Service: Cardiovascular;  Laterality: N/A;   TONSILLECTOMY      Social History   Socioeconomic History   Marital status: Married    Spouse name: Xoey Peglow   Number of children: 1   Years of education: Not on file   Highest education level: Not on file  Occupational History   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[employer])  Tobacco Use   Smoking status: Never   Smokeless tobacco: Never  Vaping Use   Vaping status: Never Used  Substance and Sexual  Activity   Alcohol use: Not Currently    Comment: occ   Drug use: Never   Sexual activity: Not Currently  Other Topics Concern   Not on file  Social History Narrative   Not on file   Social Drivers of Health   Financial Resource Strain: Low Risk  (07/23/2019)   Overall Financial Resource Strain (CARDIA)    Difficulty of Paying Living Expenses: Not hard at all  Food Insecurity: No Food Insecurity (10/03/2023)   Hunger Vital Sign    Worried About Running Out of Food in the Last Year: Never true    Ran Out of Food in the Last Year: Never true  Transportation Needs: No Transportation Needs (10/03/2023)   PRAPARE - Administrator, Civil Service (Medical): No    Lack of Transportation (Non-Medical): No  Physical Activity: Unknown (07/23/2019)   Exercise Vital Sign    Days of Exercise per Week: 2 days    Minutes of Exercise per Session: Not on file  Stress: No Stress Concern Present (07/23/2019)   [employer] of Occupational Health - Occupational Stress Questionnaire    Feeling of Stress : Not at all  Social Connections: Not on file  Intimate Partner Violence: Not At Risk (10/03/2023)   Humiliation, Afraid, Rape, and Kick questionnaire    Fear of Current or Ex-Partner: No    Emotionally Abused: No    Physically Abused: No    Sexually Abused: No    Family History  Problem Relation Age of Onset   Atrial fibrillation Mother        Passed January 2024.   Colon cancer Mother        Diagnosed 2007   Heart Problems Father    Heart attack Father    Heart disease Father    No Known Allergies I? Current Facility-Administered Medications  Medication Dose Route Frequency Provider Last Rate Last Admin   acetaminophen  (TYLENOL ) tablet 650 mg  650 mg Oral Q6H PRN Cox, Amy N, DO       Or   acetaminophen  (TYLENOL ) suppository 650 mg  650 mg Rectal Q6H PRN Cox, Amy N, DO       heparin  injection 5,000 Units  5,000 Units Subcutaneous Q8H Cox, Amy N, DO   5,000 Units at  10/03/23 1610   hydrALAZINE  (APRESOLINE ) injection 5 mg  5 mg Intravenous Q6H PRN Cox, Amy N, DO       insulin  aspart (novoLOG ) injection 0-15 Units  0-15 Units Subcutaneous TID WC Cox, Amy N, DO   2 Units at 10/03/23 1011   insulin  aspart (novoLOG ) injection 0-5 Units  0-5 Units Subcutaneous QHS Cox, Amy N, DO       metroNIDAZOLE  (FLAGYL ) IVPB 500 mg  500 mg Intravenous Q8H Zhang, Dekui, MD   Stopped at 10/03/23 1121   multivitamin with minerals tablet 1 tablet  1 tablet Oral Daily Cox, Amy N, DO   1 tablet at 10/03/23 1009   nitroGLYCERIN  (NITROSTAT ) SL  tablet 0.4 mg  0.4 mg Sublingual Q5 min PRN Cox, Amy N, DO       ondansetron  (ZOFRAN ) tablet 4 mg  4 mg Oral Q6H PRN Cox, Amy N, DO       Or   ondansetron  (ZOFRAN ) injection 4 mg  4 mg Intravenous Q6H PRN Cox, Amy N, DO       oxyCODONE  (Oxy IR/ROXICODONE ) immediate release tablet 5 mg  5 mg Oral TID PRN Cox, Amy N, DO       sodium bicarbonate 150 mEq in sterile water  1,150 mL infusion   Intravenous Continuous Donaciano Frizzle, MD 75 mL/hr at 10/03/23 1018 New Bag at 10/03/23 1018   thyroid  (ARMOUR) tablet 120 mg  120 mg Oral QAC breakfast Cox, Amy N, DO   120 mg at 10/03/23 1010   vancomycin  (VANCOCIN ) capsule 500 mg  500 mg Oral Q6H Donaciano Frizzle, MD   500 mg at 10/03/23 1010   Current Outpatient Medications  Medication Sig Dispense Refill   bisacodyl  (DULCOLAX) 10 MG suppository Place rectally.     dexamethasone  (DECADRON ) 4 MG tablet Take 2 tabs by mouth starting day after last dose of etoposide  for one day only. Repeat every 21 days. Take with food. 8 tablet 0   hyoscyamine (LEVSIN SL) 0.125 MG SL tablet Place 0.125 mg under the tongue every 6 (six) hours as needed for cramping.     lidocaine -prilocaine  (EMLA ) cream Apply to affected area once 30 g 3   losartan  (COZAAR ) 25 MG tablet Take 50 mg by mouth daily.     Multiple Vitamin (MULTI-VITAMIN) tablet Take 1 tablet by mouth daily.     ondansetron  (ZOFRAN ) 8 MG tablet Take 1 tablet (8 mg  total) by mouth every 8 (eight) hours as needed for nausea, vomiting or refractory nausea / vomiting. Start on the third day after carboplatin . 30 tablet 1   oxyCODONE  (OXY IR/ROXICODONE ) 5 MG immediate release tablet Take 5 mg by mouth 3 (three) times daily as needed for moderate pain (pain score 4-6).     prochlorperazine  (COMPAZINE ) 10 MG tablet Take 1 tablet (10 mg total) by mouth every 6 (six) hours as needed for nausea or vomiting. 30 tablet 1   thyroid  (ARMOUR) 120 MG tablet Take 120 mg by mouth daily before breakfast.     Vitamin D, Ergocalciferol, (DRISDOL) 1.25 MG (50000 UNIT) CAPS capsule Take 50,000 Units by mouth once a week.     nitroGLYCERIN  (NITROSTAT ) 0.4 MG SL tablet Place 1 tablet (0.4 mg total) under the tongue every 5 (five) minutes as needed for chest pain. (Patient not taking: Reported on 09/21/2023) 25 tablet 3   rosuvastatin (CRESTOR) 10 MG tablet Take 10 mg by mouth daily. (Patient not taking: Reported on 09/21/2023)       Abtx:  Anti-infectives (From admission, onward)    Start     Dose/Rate Route Frequency Ordered Stop   10/03/23 1900  vancomycin  (VANCOREADY) IVPB 1250 mg/250 mL  Status:  Discontinued        1,250 mg 166.7 mL/hr over 90 Minutes Intravenous Every 24 hours 10/02/23 1816 10/03/23 0749   10/03/23 0800  vancomycin  (VANCOCIN ) capsule 500 mg        500 mg Oral Every 6 hours 10/03/23 0751 10/17/23 0559   10/03/23 0800  metroNIDAZOLE  (FLAGYL ) IVPB 500 mg        500 mg 100 mL/hr over 60 Minutes Intravenous Every 8 hours 10/03/23 0751 10/17/23 0759   10/03/23 0500  metroNIDAZOLE  (  FLAGYL ) IVPB 500 mg  Status:  Discontinued        500 mg 100 mL/hr over 60 Minutes Intravenous Every 12 hours 10/02/23 1753 10/03/23 0751   10/03/23 0100  ceFEPIme  (MAXIPIME ) 2 g in sodium chloride  0.9 % 100 mL IVPB  Status:  Discontinued        2 g 200 mL/hr over 30 Minutes Intravenous Every 8 hours 10/02/23 1816 10/03/23 0749   10/02/23 2130  vancomycin  (VANCOCIN ) IVPB 1000 mg/200  mL premix       Placed in "And" Linked Group   1,000 mg 200 mL/hr over 60 Minutes Intravenous  Once 10/02/23 1802 10/03/23 0003   10/02/23 1900  vancomycin  (VANCOREADY) IVPB 1500 mg/300 mL       Placed in "And" Linked Group   1,500 mg 150 mL/hr over 120 Minutes Intravenous  Once 10/02/23 1802 10/02/23 2047   10/02/23 1645  ceFEPIme  (MAXIPIME ) 2 g in sodium chloride  0.9 % 100 mL IVPB        2 g 200 mL/hr over 30 Minutes Intravenous  Once 10/02/23 1631 10/02/23 1755   10/02/23 1645  metroNIDAZOLE  (FLAGYL ) IVPB 500 mg        500 mg 100 mL/hr over 60 Minutes Intravenous  Once 10/02/23 1631 10/02/23 1858   10/02/23 1645  vancomycin  (VANCOCIN ) IVPB 1000 mg/200 mL premix  Status:  Discontinued        1,000 mg 200 mL/hr over 60 Minutes Intravenous  Once 10/02/23 1631 10/02/23 1801       REVIEW OF SYSTEMS:  Const:  no fever,  chills, negative weight loss Eyes: negative diplopia or visual changes, negative eye pain ENT: negative coryza, negative sore throat Resp: negative cough, hemoptysis, has dyspnea Cards: negative for chest pain, palpitations, lower extremity edema GU: negative for frequency, dysuria and hematuria GI:  abdominal pain, diarrhea, no bleeding, had constipation Skin: negative for rash and pruritus Heme: negative for easy bruising and gum/nose bleeding MS: negative for myalgias, arthralgias, back pain and muscle weakness Neurolo:negative for headaches, dizziness, vertigo, memory problems  Psych:  anxiety, depression  Endocrine: hypothyroidism and  diabetes Allergy/Immunology- negative for any medication or food allergies  Objective:  VITALS:  BP 130/74 (BP Location: Left Arm)   Pulse 80   Temp 98.2 F (36.8 C) (Oral)   Resp 17   Ht 5\' 4"  (1.626 m)   LMP 07/22/2016   SpO2 99%   BMI 45.49 kg/m   PHYSICAL EXAM:  General: Alert, cooperative, some  distress, pale increased BMI Head: Normocephalic, without obvious abnormality, atraumatic. Eyes: Conjunctivae clear,  anicteric sclerae. Pupils are equal ENT Nares normal. No drainage or sinus tenderness. Tongue coated- oral cavity dry Neck: Supple, symmetrical, no adenopathy, thyroid : non tender no carotid bruit and no JVD. Back: No CVA tenderness. Lungs: b/la ir entry Heart: Regular rate and rhythm, no murmur, rub or gallop. Abdomen: Soft, obese. Bowel sounds normal. No masses Extremities: edema legs Skin: No rashes or lesions. Or bruising Lymph: Cervical, supraclavicular normal. Neurologic: Grossly non-focal PORT in place- looks fine Pertinent Labs Lab Results CBC    Component Value Date/Time   WBC 0.5 (LL) 10/03/2023 0633   RBC 4.36 10/03/2023 0633   HGB 13.4 10/03/2023 0633   HGB 14.2 09/26/2023 0824   HCT 38.2 10/03/2023 0633   PLT 138 (L) 10/03/2023 0633   PLT 460 (H) 09/26/2023 0824   MCV 87.6 10/03/2023 0633   MCH 30.7 10/03/2023 0633   MCHC 35.1 10/03/2023 0633   RDW  14.3 10/03/2023 0633   LYMPHSABS 0.4 (L) 10/03/2023 0633   MONOABS 0.0 (L) 10/03/2023 0633   EOSABS 0.0 10/03/2023 0633   BASOSABS 0.0 10/03/2023 0633       Latest Ref Rng & Units 10/03/2023    6:33 AM 10/02/2023    3:36 PM 09/26/2023    8:24 AM  CMP  Glucose 70 - 99 mg/dL 086  578  469   BUN 6 - 20 mg/dL 47  46  43   Creatinine 0.44 - 1.00 mg/dL 6.29  5.28  4.13   Sodium 135 - 145 mmol/L 126  125  121   Potassium 3.5 - 5.1 mmol/L 4.2  4.6  4.2   Chloride 98 - 111 mmol/L 96  94  86   CO2 22 - 32 mmol/L 20  16  21    Calcium  8.9 - 10.3 mg/dL 9.1  9.3  9.5   Total Protein 6.5 - 8.1 g/dL  6.7  7.3   Total Bilirubin 0.0 - 1.2 mg/dL  1.5  1.2   Alkaline Phos 38 - 126 U/L  106  94   AST 15 - 41 U/L  43  59   ALT 0 - 44 U/L  24  15       Microbiology: Recent Results (from the past 240 hours)  Resp panel by RT-PCR (RSV, Flu A&B, Covid) Anterior Nasal Swab     Status: None   Collection Time: 10/02/23  3:36 PM   Specimen: Anterior Nasal Swab  Result Value Ref Range Status   SARS Coronavirus 2 by RT PCR NEGATIVE  NEGATIVE Final    Comment: (NOTE) SARS-CoV-2 target nucleic acids are NOT DETECTED.  The SARS-CoV-2 RNA is generally detectable in upper respiratory specimens during the acute phase of infection. The lowest concentration of SARS-CoV-2 viral copies this assay can detect is 138 copies/mL. A negative result does not preclude SARS-Cov-2 infection and should not be used as the sole basis for treatment or other patient management decisions. A negative result may occur with  improper specimen collection/handling, submission of specimen other than nasopharyngeal swab, presence of viral mutation(s) within the areas targeted by this assay, and inadequate number of viral copies(<138 copies/mL). A negative result must be combined with clinical observations, patient history, and epidemiological information. The expected result is Negative.  Fact Sheet for Patients:  BloggerCourse.com  Fact Sheet for Healthcare Providers:  SeriousBroker.it  This test is no t yet approved or cleared by the [place]  FDA and  has been authorized for detection and/or diagnosis of SARS-CoV-2 by FDA under an Emergency Use Authorization (EUA). This EUA will remain  in effect (meaning this test can be used) for the duration of the COVID-19 declaration under Section 564(b)(1) of the Act, 21 U.S.C.section 360bbb-3(b)(1), unless the authorization is terminated  or revoked sooner.       Influenza A by PCR NEGATIVE NEGATIVE Final   Influenza B by PCR NEGATIVE NEGATIVE Final    Comment: (NOTE) The Xpert Xpress SARS-CoV-2/FLU/RSV plus assay is intended as an aid in the diagnosis of influenza from Nasopharyngeal swab specimens and should not be used as a sole basis for treatment. Nasal washings and aspirates are unacceptable for Xpert Xpress SARS-CoV-2/FLU/RSV testing.  Fact Sheet for Patients: BloggerCourse.com  Fact Sheet for Healthcare  Providers: SeriousBroker.it  This test is not yet approved or cleared by the [place]  FDA and has been authorized for detection and/or diagnosis of SARS-CoV-2 by FDA under an Emergency Use Authorization (EUA).  This EUA will remain in effect (meaning this test can be used) for the duration of the COVID-19 declaration under Section 564(b)(1) of the Act, 21 U.S.C. section 360bbb-3(b)(1), unless the authorization is terminated or revoked.     Resp Syncytial Virus by PCR NEGATIVE NEGATIVE Final    Comment: (NOTE) Fact Sheet for Patients: BloggerCourse.com  Fact Sheet for Healthcare Providers: SeriousBroker.it  This test is not yet approved or cleared by the [place]  FDA and has been authorized for detection and/or diagnosis of SARS-CoV-2 by FDA under an Emergency Use Authorization (EUA). This EUA will remain in effect (meaning this test can be used) for the duration of the COVID-19 declaration under Section 564(b)(1) of the Act, 21 U.S.C. section 360bbb-3(b)(1), unless the authorization is terminated or revoked.  Performed at [facility], [address] [place], [place] [address]   Culture, blood (routine x 2)     Status: None (Preliminary result)   Collection Time: 10/02/23  3:36 PM   Specimen: BLOOD  Result Value Ref Range Status   Specimen Description BLOOD RIGHT ANTECUBITAL  Final   Special Requests   Final    BOTTLES DRAWN AEROBIC AND ANAEROBIC Blood Culture results may not be optimal due to an inadequate volume of blood received in culture bottles   Culture   Final    NO GROWTH < 24 HOURS Performed at [facility], [address] [place], [place] [address]    Report Status PENDING  Incomplete  Culture, blood (routine x 2)     Status: None (Preliminary result)   Collection Time: 10/02/23  4:13 PM   Specimen: BLOOD  Result Value Ref Range Status   Specimen  Description BLOOD BLOOD LEFT HAND  Final   Special Requests   Final    BOTTLES DRAWN AEROBIC AND ANAEROBIC Blood Culture results may not be optimal due to an inadequate volume of blood received in culture bottles   Culture  Setup Time   Final    GRAM POSITIVE COCCI ANAEROBIC BOTTLE ONLY Organism ID to follow CRITICAL RESULT CALLED TO, READ BACK BY AND VERIFIED WITH: Villa Greaser 10/03/2023 [facility] Performed at [facility], [address] [place], [place] [address]    Culture GRAM POSITIVE COCCI  Final   Report Status PENDING  Incomplete  Blood Culture ID Panel (Reflexed)     Status: Abnormal   Collection Time: 10/02/23  4:13 PM  Result Value Ref Range Status   Enterococcus faecalis NOT DETECTED NOT DETECTED Final   Enterococcus Faecium NOT DETECTED NOT DETECTED Final   Listeria monocytogenes NOT DETECTED NOT DETECTED Final   Staphylococcus species NOT DETECTED NOT DETECTED Final   Staphylococcus aureus (BCID) NOT DETECTED NOT DETECTED Final   Staphylococcus epidermidis NOT DETECTED NOT DETECTED Final   Staphylococcus lugdunensis NOT DETECTED NOT DETECTED Final   Streptococcus species DETECTED (A) NOT DETECTED Final    Comment: Not Enterococcus species, Streptococcus agalactiae, Streptococcus pyogenes, or Streptococcus pneumoniae. CRITICAL RESULT CALLED TO, READ BACK BY AND VERIFIED WITH: MERRILL, K. 0944 10/03/2023 LRL    Streptococcus agalactiae NOT DETECTED NOT DETECTED Final   Streptococcus pneumoniae NOT DETECTED NOT DETECTED Final   Streptococcus pyogenes NOT DETECTED NOT DETECTED Final   A.calcoaceticus-baumannii NOT DETECTED NOT DETECTED Final   Bacteroides fragilis NOT DETECTED NOT DETECTED Final   Enterobacterales NOT DETECTED NOT DETECTED Final   Enterobacter cloacae complex NOT DETECTED NOT DETECTED Final   Escherichia coli NOT DETECTED NOT DETECTED Final   Klebsiella aerogenes  NOT DETECTED NOT DETECTED Final   Klebsiella oxytoca NOT DETECTED NOT DETECTED  Final   Klebsiella pneumoniae NOT DETECTED NOT DETECTED Final   Proteus species NOT DETECTED NOT DETECTED Final   Salmonella species NOT DETECTED NOT DETECTED Final   Serratia marcescens NOT DETECTED NOT DETECTED Final   Haemophilus influenzae NOT DETECTED NOT DETECTED Final   Neisseria meningitidis NOT DETECTED NOT DETECTED Final   Pseudomonas aeruginosa NOT DETECTED NOT DETECTED Final   Stenotrophomonas maltophilia NOT DETECTED NOT DETECTED Final   Candida albicans NOT DETECTED NOT DETECTED Final   Candida auris NOT DETECTED NOT DETECTED Final   Candida glabrata NOT DETECTED NOT DETECTED Final   Candida krusei NOT DETECTED NOT DETECTED Final   Candida parapsilosis NOT DETECTED NOT DETECTED Final   Candida tropicalis NOT DETECTED NOT DETECTED Final   Cryptococcus neoformans/gattii NOT DETECTED NOT DETECTED Final    Comment: Performed at [facility], [address] [place], [place] [address]  Gastrointestinal Panel by PCR , Stool     Status: None   Collection Time: 10/02/23  5:54 PM   Specimen: Stool  Result Value Ref Range Status   Campylobacter species NOT DETECTED NOT DETECTED Final   Plesimonas shigelloides NOT DETECTED NOT DETECTED Final   Salmonella species NOT DETECTED NOT DETECTED Final   Yersinia enterocolitica NOT DETECTED NOT DETECTED Final   Vibrio species NOT DETECTED NOT DETECTED Final   Vibrio cholerae NOT DETECTED NOT DETECTED Final   Enteroaggregative E coli (EAEC) NOT DETECTED NOT DETECTED Final   Enteropathogenic E coli (EPEC) NOT DETECTED NOT DETECTED Final   Enterotoxigenic E coli (ETEC) NOT DETECTED NOT DETECTED Final   Shiga like toxin producing E coli (STEC) NOT DETECTED NOT DETECTED Final   Shigella/Enteroinvasive E coli (EIEC) NOT DETECTED NOT DETECTED Final   Cryptosporidium NOT DETECTED NOT DETECTED Final   Cyclospora cayetanensis NOT DETECTED NOT DETECTED Final   Entamoeba histolytica NOT DETECTED NOT DETECTED Final   Giardia lamblia NOT  DETECTED NOT DETECTED Final   Adenovirus F40/41 NOT DETECTED NOT DETECTED Final   Astrovirus NOT DETECTED NOT DETECTED Final   Norovirus GI/GII NOT DETECTED NOT DETECTED Final   Rotavirus A NOT DETECTED NOT DETECTED Final   Sapovirus (I, II, IV, and V) NOT DETECTED NOT DETECTED Final    Comment: Performed at [facility], [address] [place], [place] [address]  C Difficile Quick Screen w PCR reflex     Status: Abnormal   Collection Time: 10/02/23  5:54 PM   Specimen: Stool  Result Value Ref Range Status   C Diff antigen POSITIVE (A) NEGATIVE Final   C Diff toxin NEGATIVE NEGATIVE Final   C Diff interpretation Results are indeterminate. See PCR results.  Final    Comment: Performed at [facility], [address] [place], [place] [address]  C. Diff by PCR, Reflexed     Status: Abnormal   Collection Time: 10/02/23  5:54 PM  Result Value Ref Range Status   Toxigenic C. Difficile by PCR POSITIVE (A) NEGATIVE Final    Comment: Positive for toxigenic C. difficile with little to no toxin production. Only treat if clinical presentation suggests symptomatic illness. CRITICAL RESULT CALLED TO, READ BACK BY AND VERIFIED WITH: PAM RYAN RN @ 2032 10/02/23 [facility] Performed at [facility], [address] [place], [place] [address]     IMAGING RESULTS:  I have personally reviewed the films No acute findings ? Impression/Recommendation Streptococcus bacteremia in a patient who  received chemotherapy and has severe diarrhea and neutropenia The diarrhea is a combination of previous use of miralax  and now chemo   Likely  translocation of bacteria from the GIT into the blood Doubt the port is the source DC cefepime  and vanco- start ceftriaxone   Cdiff antigen and PCR positive and toxin negative Infection VS colonization On vanco 500mg  q6 and metronidazole  Can reduce the dose soon  Poorly differentiated carcinoma of the endometrium/cervix with neuroendocrine  feathers with mets to the lymphnodes and peritoneum Received 1 st chemo for 3 days   DM  I have personally spent  60---minutes involved in face-to-face and non-face-to-face activities for this patient on the day of the visit. Professional time spent includes the following activities: Preparing to see the patient (review of tests), Obtaining and/or reviewing separately obtained history (admission/discharge record), Performing a medically appropriate examination and/or evaluation , Ordering medications/tests/procedures, referring and communicating with other health care professionals, Documenting clinical information in the EMR, Independently interpreting results (not separately reported), Communicating results to the patient/family/caregiver, Counseling and educating the patient/family/caregiver and Care coordination (not separately reported).    ________________________________________________  Note:  This document was prepared using Conservation officer, historic buildings and may include unintentional dictation errors.

## 2023-10-03 NOTE — Progress Notes (Signed)
 Progress Note   Patient: Tonya [name] ION:[id number] DOB: Apr 18, 1966 DOA: 10/02/2023     0 DOS: the patient was seen and examined on 10/03/2023   Brief hospital course: Tonya [name] is a 58 year old female with history of cervical cancer on chemotherapy, hypertension, GERD, non-insulin -dependent diabetes mellitus, hyperlipidemia, who presents emergency department for chief concerns of diarrhea, congestion, weakness. She meets sepsis criteria with tachycardia and significant leukocytosis, lactic acid 3.3.  Patient also had hyponatremia, metabolic acidosis.  Patient was given fluids, she also received broad-spectrum antibiotics.  Stool study came back with C. difficile toxin positive.  Treated with IV Flagyl  and oral vancomycin .  Patient also developed neutropenia, oncology consult obtained.  Blood culture in 1 bottle positive for Streptococcus, ID consult also obtained.  Patient is placed on IV Rocephin .    Principal Problem:   Severe sepsis with acute organ dysfunction (HCC) Active Problems:   Hypothyroidism (acquired)   Ischemic cardiomyopathy   Essential hypertension   Hyperlipidemia associated with type 2 diabetes mellitus (HCC)   Morbid obesity (HCC)   Diabetes mellitus (HCC)   Coronary artery disease involving native coronary artery of native heart with angina pectoris (HCC)   Endometrial carcinoma (HCC)   Diarrhea   Leukopenia   AKI (acute kidney injury) (HCC)   Hyponatremia   Metabolic acidosis   Neutropenia (HCC)   C. difficile colitis   Assessment and Plan: *Severe sepsis with Streptococcus septicemia. C. difficile colitis. Patient meet severe sepsis criteria with leukocytosis tachycardia and elevated lactic acid level.  This is mainly caused by C. difficile colitis. Antibiotic has switched to IV Flagyl  and oral vancomycin . Patient currently hemodynamically stable. Patient blood culture 1 bottle also positive for Streptococcus.  Will obtain consult from  ID.  Neutropenia. Thrombocytopenia. Endometrial cancer status post chemotherapy. Oncology consult is obtained.  Hyponatremia  Metabolic acidosis. Acute kidney injury. Patient will be placed on sodium bicarb drip.  Renal function has improved. Recheck levels tomorrow.  Coronary artery disease involving native coronary artery of native heart with angina pectoris (HCC) Home nitroglycerin  as needed for chest pain resumed  Diabetes mellitus (HCC) Continue sliding scale insulin .  Morbid obesity (HCC) This complicates overall care and prognosis.   Essential hypertension As needed blood pressure medicine ordered.  Hypothyroidism (acquired) Home thyroid  Armour 125 mg daily before breakfast resumed       Subjective:  Patient still has significant diarrhea, no nausea vomiting.  No fever or chills.  Physical Exam: Vitals:   10/03/23 1030 10/03/23 1144 10/03/23 1151 10/03/23 1234  BP:   130/74 (!) 141/78  Pulse:   80 91  Resp:   17 16  Temp: 98.4 F (36.9 C)  98.2 F (36.8 C) 98.3 F (36.8 C)  TempSrc:   Oral   SpO2:   99% 99%  Height:  5\' 4"  (1.626 m)     General exam: Appears calm and comfortable, morbid obese. Respiratory system: Clear to auscultation. Respiratory effort normal. Cardiovascular system: S1 & S2 heard, RRR. No JVD, murmurs, rubs, gallops or clicks. No pedal edema. Gastrointestinal system: Abdomen is nondistended, soft and mild tender. No organomegaly or masses felt. Normal bowel sounds heard. Central nervous system: Alert and oriented. No focal neurological deficits. Extremities: Symmetric 5 x 5 power. Skin: No rashes, lesions or ulcers Psychiatry: Judgement and insight appear normal. Mood & affect appropriate.    Data Reviewed:  Reviewed lab results.  Chest x-ray.  Family Communication: None  Disposition: Status is: Inpatient Remains inpatient appropriate because: Severity of  disease, IV treatment.     Time spent: 50  minutes  Author: Donaciano Frizzle, MD 10/03/2023 1:43 PM  For on call review [email or web address].

## 2023-10-03 NOTE — ED Notes (Signed)
 Pt refusing rectal tube at this time. Pt sts " I would rather get up and use the bedside toilet."

## 2023-10-03 NOTE — Telephone Encounter (Signed)
 Patient called from the hospital letting Dr. Randy Buttery know that she had to go take a ambulance to the hospital because she was just pouring with diarrhea week ankles were swollen she did not even feel good enough that she could step off 2 steps and so EMS got her she said also the labs were abnormal and as of today they have said that she has C. Difficile.  Also she had a scan and she moved it to 2/12 now she wants to move to not this Friday but next Friday I told her that I had asked the schedulers for it to get rescheduled

## 2023-10-03 NOTE — Progress Notes (Addendum)
PHARMACY - PHYSICIAN COMMUNICATION CRITICAL VALUE ALERT - BLOOD CULTURE IDENTIFICATION (BCID)  Tonya [name] is an 58 y.o. female who presented to [facility] on 10/02/2023 with a chief complaint of diarrhea on chemotherapy.  Patient has PORT  Assessment:  blood cultures from 2/9 with 1 of 4 bottles(anaerobic) growing GPC(strep viridians) and a separate 1 of 4 bottles(aerobic) growing GPR(sent off to [facility] lab for further speciation  Name of physician (or Provider) Contacted: Dr Chipper Herb  Current antibiotics: Ceftriaxone 2g IV Q24h for strep bacteremia, PO vancomycin for C. Difficile.    Changes to prescribed antibiotics recommended:  Continue with Ceftriaxone. ID team already consult and will re-evaluate in the morning.  Results for orders placed or performed during the hospital encounter of 10/02/23  Blood Culture ID Panel (Reflexed) (Collected: 10/02/2023  4:13 PM)  Result Value Ref Range   Enterococcus faecalis NOT DETECTED NOT DETECTED   Enterococcus Faecium NOT DETECTED NOT DETECTED   Listeria monocytogenes NOT DETECTED NOT DETECTED   Staphylococcus species NOT DETECTED NOT DETECTED   Staphylococcus aureus (BCID) NOT DETECTED NOT DETECTED   Staphylococcus epidermidis NOT DETECTED NOT DETECTED   Staphylococcus lugdunensis NOT DETECTED NOT DETECTED   Streptococcus species DETECTED (A) NOT DETECTED   Streptococcus agalactiae NOT DETECTED NOT DETECTED   Streptococcus pneumoniae NOT DETECTED NOT DETECTED   Streptococcus pyogenes NOT DETECTED NOT DETECTED   A.calcoaceticus-baumannii NOT DETECTED NOT DETECTED   Bacteroides fragilis NOT DETECTED NOT DETECTED   Enterobacterales NOT DETECTED NOT DETECTED   Enterobacter cloacae complex NOT DETECTED NOT DETECTED   Escherichia coli NOT DETECTED NOT DETECTED   Klebsiella aerogenes NOT DETECTED NOT DETECTED   Klebsiella oxytoca NOT DETECTED NOT DETECTED   Klebsiella pneumoniae NOT DETECTED NOT DETECTED   Proteus species NOT DETECTED NOT DETECTED    Salmonella species NOT DETECTED NOT DETECTED   Serratia marcescens NOT DETECTED NOT DETECTED   Haemophilus influenzae NOT DETECTED NOT DETECTED   Neisseria meningitidis NOT DETECTED NOT DETECTED   Pseudomonas aeruginosa NOT DETECTED NOT DETECTED   Stenotrophomonas maltophilia NOT DETECTED NOT DETECTED   Candida albicans NOT DETECTED NOT DETECTED   Candida auris NOT DETECTED NOT DETECTED   Candida glabrata NOT DETECTED NOT DETECTED   Candida krusei NOT DETECTED NOT DETECTED   Candida parapsilosis NOT DETECTED NOT DETECTED   Candida tropicalis NOT DETECTED NOT DETECTED   Cryptococcus neoformans/gattii NOT DETECTED NOT DETECTED   Juliette Alcide, PharmD, BCPS, BCIDP Work Cell: [phone] 10/03/2023 12:17 PM

## 2023-10-03 NOTE — Consult Note (Signed)
 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 difficile.  INTERVAL HISTORY: Patient is a 58 year old female who recently started chemotherapy with cisplatin and etoposide  for the above-stated malignancy who was having profuse watery diarrhea over the weekend.  Patient was evaluated in the emergency room was found to have C. difficile.  She reports her diarrhea is still evident but improved.  She otherwise feels well.  She has no neurologic complaints.  She denies any fevers.  She has a fair appetite, but denies weight loss.  She has no chest pain, shortness of breath, cough, or hemoptysis.  She denies any nausea, vomiting, or constipation.  She has no melena or hematochezia.  She has no urinary complaints.  Patient offers no further specific complaints today.  REVIEW OF SYSTEMS:   Review of Systems  Constitutional:  Positive for malaise/fatigue. Negative for fever and weight loss.  Respiratory: Negative.  Negative for cough, hemoptysis and shortness of breath.   Cardiovascular: Negative.  Negative for chest pain and leg swelling.  Gastrointestinal:  Positive for diarrhea. Negative for abdominal pain, blood in stool and melena.  Genitourinary: Negative.  Negative for dysuria.  Musculoskeletal: Negative.  Negative for back pain.  Skin: Negative.  Negative for rash.  Neurological: Negative.  Negative for dizziness, focal weakness, weakness and headaches.  Psychiatric/Behavioral: Negative.  The patient is not nervous/anxious.     As per HPI. Otherwise, a complete review of systems is negative.  PAST  MEDICAL HISTORY: Past Medical History:  Diagnosis Date   Cancer (HCC)    Coronary artery disease    GERD (gastroesophageal reflux disease)    Hypertension    Hypothyroidism    Myocardial infarction (HCC)    Thyroid  disease     PAST SURGICAL HISTORY: Past Surgical History:  Procedure Laterality Date   CARDIAC CATHETERIZATION     CESAREAN SECTION     CORONARY STENT INTERVENTION N/A 07/24/2019   Procedure: CORONARY STENT INTERVENTION;  Surgeon: Sammy Crisp, MD;  Location: [facility] INVASIVE CV LAB;  Service: Cardiovascular;  Laterality: N/A;  RCA & CFX   IR IMAGING GUIDED PORT INSERTION  09/23/2023   LEFT HEART CATH AND CORONARY ANGIOGRAPHY N/A 07/24/2019   Procedure: LEFT HEART CATH AND CORONARY ANGIOGRAPHY;  Surgeon: Michelle Aid, MD;  Location: [facility] INVASIVE CV LAB;  Service: Cardiovascular;  Laterality: N/A;   TONSILLECTOMY      FAMILY HISTORY: Family History  Problem Relation Age of Onset   Atrial fibrillation Mother        Passed January 2024.   Colon cancer Mother        Diagnosed 2007   Heart Problems Father    Heart attack Father    Heart disease Father     ADVANCED DIRECTIVES (Y/N):  @ADVDIR @  HEALTH MAINTENANCE: Social History   Tobacco Use   Smoking status: Never   Smokeless tobacco: Never  Vaping Use   Vaping status: Never Used  Substance Use Topics   Alcohol use: Not Currently    Comment: occ   Drug use: Never     Colonoscopy:  PAP:  Bone density:  Lipid panel:  No Known Allergies  Current Facility-Administered Medications  Medication Dose Route Frequency Provider Last Rate Last Admin   acetaminophen  (TYLENOL ) tablet 650 mg  650 mg Oral Q6H PRN Cox, Amy N, DO       Or   acetaminophen  (TYLENOL ) suppository 650 mg  650 mg Rectal Q6H PRN Cox, Amy N, DO       cefTRIAXone  (ROCEPHIN ) 2 g in sodium chloride  0.9 % 100 mL IVPB  2 g Intravenous Daily Alica Inks, MD   Stopped at 10/03/23 1339   Chlorhexidine  Gluconate Cloth 2 % PADS 6  each  6 each Topical Daily Donaciano Frizzle, MD       heparin  injection 5,000 Units  5,000 Units Subcutaneous Q8H Cox, Amy N, DO   5,000 Units at 10/03/23 1313   hydrALAZINE  (APRESOLINE ) injection 5 mg  5 mg Intravenous Q6H PRN Cox, Amy N, DO       insulin  aspart (novoLOG ) injection 0-15 Units  0-15 Units Subcutaneous TID WC Cox, Amy N, DO   2 Units at 10/03/23 1011   insulin  aspart (novoLOG ) injection 0-5 Units  0-5 Units Subcutaneous QHS Cox, Amy N, DO       metroNIDAZOLE  (FLAGYL ) IVPB 500 mg  500 mg Intravenous Q8H Zhang, Dekui, MD   Stopped at 10/03/23 1117   multivitamin with minerals tablet 1 tablet  1 tablet Oral Daily Cox, Amy N, DO   1 tablet at 10/03/23 1009   nitroGLYCERIN  (NITROSTAT ) SL tablet 0.4 mg  0.4 mg Sublingual Q5 min PRN Cox, Amy N, DO       ondansetron  (ZOFRAN ) tablet 4 mg  4 mg Oral Q6H PRN Cox, Amy N, DO       Or   ondansetron  (ZOFRAN ) injection 4 mg  4 mg Intravenous Q6H PRN Cox, Amy N, DO       oxyCODONE  (Oxy IR/ROXICODONE ) immediate release tablet 5 mg  5 mg Oral TID PRN Cox, Amy N, DO   5 mg at 10/03/23 1408   sodium bicarbonate 150 mEq in sterile water  1,150 mL infusion   Intravenous Continuous Donaciano Frizzle, MD 75 mL/hr at 10/03/23 1508 Infusion Verify at 10/03/23 1508   thyroid  (ARMOUR) tablet 120 mg  120 mg Oral QAC breakfast Cox, Amy N, DO   120 mg at 10/03/23 1010   vancomycin  (VANCOCIN ) capsule 500 mg  500 mg Oral Q6H Donaciano Frizzle, MD   500 mg at 10/03/23 1408    OBJECTIVE: Vitals:   10/03/23 1151 10/03/23 1234  BP: 130/74 (!) 141/78  Pulse: 80 91  Resp: 17 16  Temp: 98.2 F (36.8 C) 98.3 F (36.8 C)  SpO2: 99% 99%     Body mass index is 45.49 kg/m.    ECOG FS:1 - Symptomatic but completely ambulatory  General: Well-developed, well-nourished, no acute distress. Eyes: Pink conjunctiva, anicteric sclera. HEENT: Normocephalic, moist mucous membranes. Lungs: No audible wheezing or coughing. Heart: Regular rate and rhythm. Abdomen: Soft, nontender, no  obvious distention. Musculoskeletal: No edema, cyanosis, or clubbing. Neuro: Alert, answering all questions appropriately. Cranial nerves grossly intact. Skin: No rashes or petechiae noted. Psych: Normal affect. Lymphatics: No cervical, calvicular, axillary or inguinal LAD.   LAB RESULTS:  Lab Results  Component Value Date   NA 126 (L) 10/03/2023   K 4.2 10/03/2023   CL 96 (L) 10/03/2023   CO2 20 (L) 10/03/2023   GLUCOSE 155 (H) 10/03/2023   BUN 47 (H) 10/03/2023   CREATININE 0.92 10/03/2023   CALCIUM  9.1 10/03/2023   PROT 6.7 10/02/2023   ALBUMIN 3.0 (  L) 10/02/2023   AST 43 (H) 10/02/2023   ALT 24 10/02/2023   ALKPHOS 106 10/02/2023   BILITOT 1.5 (H) 10/02/2023   GFRNONAA >60 10/03/2023   GFRAA >60 07/25/2019    Lab Results  Component Value Date   WBC 0.5 (LL) 10/03/2023   NEUTROABS 0.1 (LL) 10/03/2023   HGB 13.4 10/03/2023   HCT 38.2 10/03/2023   MCV 87.6 10/03/2023   PLT 138 (L) 10/03/2023     STUDIES: DG Chest Port 1 View Result Date: 10/02/2023 CLINICAL DATA:  Diarrhea. Recently started chemotherapy for cervical cancer. EXAM: PORTABLE CHEST 1 VIEW COMPARISON:  Chest x-ray dated July 23, 2019. FINDINGS: Right chest wall port catheter with tip at the cavoatrial junction. The heart size and mediastinal contours are within normal limits. Normal pulmonary vascularity. No focal consolidation, pleural effusion, or pneumothorax. No acute osseous abnormality. IMPRESSION: No active disease. Electronically Signed   By: Aleta Anda M.D.   On: 10/02/2023 16:14   IR IMAGING GUIDED PORT INSERTION Result Date: 09/23/2023 INDICATION: chemotherapy administration History of gyn malignancy. EXAM: IMPLANTED PORT A CATH PLACEMENT WITH ULTRASOUND AND FLUOROSCOPIC GUIDANCE MEDICATIONS: 25 mg Benadryl  IV. ANESTHESIA/SEDATION: Moderate (conscious) sedation was employed during this procedure. A total of Versed  1 mg and Fentanyl  100 mcg was administered intravenously. Moderate Sedation  Time: 21 minutes. The patient's level of consciousness and vital signs were monitored continuously by radiology nursing throughout the procedure under my direct supervision. FLUOROSCOPY TIME:  Fluoroscopic dose; 4 mGy COMPLICATIONS: None immediate. PROCEDURE: The procedure, risks, benefits, and alternatives were explained to the patient. Questions regarding the procedure were encouraged and answered. The patient understands and consents to the procedure. The RIGHT neck and chest were prepped with chlorhexidine  in a sterile fashion, and a sterile drape was applied covering the operative field. Maximum barrier sterile technique with sterile gowns and gloves were used for the procedure. A timeout was performed prior to the initiation of the procedure. Local anesthesia was provided with 1% lidocaine  with epinephrine . After creating a small venotomy incision, a micropuncture kit was utilized to access the internal jugular vein under direct, real-time ultrasound guidance. Ultrasound image documentation was performed. The microwire was kinked to measure appropriate catheter length. A subcutaneous port pocket was then created along the upper chest wall utilizing a combination of sharp and blunt dissection. The pocket was irrigated with sterile saline. A single lumen power injectable port was chosen for placement. The 8 Fr catheter was tunneled from the port pocket site to the venotomy incision. The port was placed in the pocket. The external catheter was trimmed to appropriate length. At the venotomy, an 8 Fr peel-away sheath was placed over a guidewire under fluoroscopic guidance. The catheter was then placed through the sheath and the sheath was removed. Final catheter positioning was confirmed and documented with a fluoroscopic spot radiograph. The port was accessed with a Huber needle, aspirated and flushed with heparinized saline. The port pocket incision was closed with interrupted 3-0 Vicryl suture then Dermabond was  applied, including at the venotomy incision. Dressings were placed. The patient tolerated the procedure well without immediate post procedural complication. IMPRESSION: Successful placement of a RIGHT internal jugular approach power injectable Port-A-Cath. The tip of the catheter is positioned within the proximal RIGHT atrium. The catheter is ready for immediate use. Art Largo, MD [facility] Electronically Signed   By: Art Largo M.D.   On: 09/23/2023 14:42    ASSESSMENT: Poorly differentiated endometrial carcinoma with  neuroendocrine features now with neutropenia and C. difficile.  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  She has been instructed to keep her previously scheduled follow-up appointment with Dr. Randy Buttery in 2 weeks for consideration of cycle 2.  Neutropenia: Secondary to chemotherapy.  Patient may benefit from Udenyca  for subsequent cycles.  C. difficile: Agree with current antibiotics.  Patient states her symptoms are improving.   Pain: Continue oxycodone  as prescribed. Hyponatremia: Patient's sodium levels remain decreased at 126, but improved over 1 week ago.  Monitor.  Appreciate consult, will follow.  Shellie Dials, MD   10/03/2023 4:42 PM

## 2023-10-04 ENCOUNTER — Other Ambulatory Visit: Payer: Self-pay | Admitting: Oncology

## 2023-10-04 DIAGNOSIS — N179 Acute kidney failure, unspecified: Secondary | ICD-10-CM | POA: Diagnosis not present

## 2023-10-04 DIAGNOSIS — R197 Diarrhea, unspecified: Secondary | ICD-10-CM | POA: Diagnosis not present

## 2023-10-04 DIAGNOSIS — A419 Sepsis, unspecified organism: Secondary | ICD-10-CM | POA: Diagnosis not present

## 2023-10-04 DIAGNOSIS — D701 Agranulocytosis secondary to cancer chemotherapy: Secondary | ICD-10-CM | POA: Diagnosis not present

## 2023-10-04 DIAGNOSIS — C541 Malignant neoplasm of endometrium: Secondary | ICD-10-CM | POA: Diagnosis not present

## 2023-10-04 DIAGNOSIS — A0472 Enterocolitis due to Clostridium difficile, not specified as recurrent: Secondary | ICD-10-CM | POA: Diagnosis not present

## 2023-10-04 LAB — GLUCOSE, CAPILLARY
Glucose-Capillary: 132 mg/dL — ABNORMAL HIGH (ref 70–99)
Glucose-Capillary: 143 mg/dL — ABNORMAL HIGH (ref 70–99)
Glucose-Capillary: 124 mg/dL — ABNORMAL HIGH (ref 70–99)
Glucose-Capillary: 103 mg/dL — ABNORMAL HIGH (ref 70–99)

## 2023-10-04 LAB — HEMOGLOBIN A1C
Hgb A1c MFr Bld: 6.5 % — ABNORMAL HIGH (ref 4.8–5.6)
Mean Plasma Glucose: 139.85 mg/dL

## 2023-10-04 LAB — BASIC METABOLIC PANEL
Anion gap: 9 (ref 5–15)
BUN: 38 mg/dL — ABNORMAL HIGH (ref 6–20)
CO2: 24 mmol/L (ref 22–32)
Calcium: 8.3 mg/dL — ABNORMAL LOW (ref 8.9–10.3)
Chloride: 92 mmol/L — ABNORMAL LOW (ref 98–111)
Creatinine, Ser: 0.74 mg/dL (ref 0.44–1.00)
GFR, Estimated: >60 mL/min (ref >60)
Glucose, Bld: 106 mg/dL — ABNORMAL HIGH (ref 70–99)
Potassium: 3.3 mmol/L — ABNORMAL LOW (ref 3.5–5.1)
Sodium: 125 mmol/L — ABNORMAL LOW (ref 135–145)

## 2023-10-04 LAB — CBC
HCT: 33.3 % — ABNORMAL LOW (ref 36.0–46.0)
Hemoglobin: 11.7 g/dL — ABNORMAL LOW (ref 12.0–15.0)
MCH: 31 pg (ref 26.0–34.0)
MCHC: 35.1 g/dL (ref 30.0–36.0)
MCV: 88.1 fL (ref 80.0–100.0)
Platelets: 78 10*3/uL — ABNORMAL LOW (ref 150–400)
RBC: 3.78 MIL/uL — ABNORMAL LOW (ref 3.87–5.11)
RDW: 14 % (ref 11.5–15.5)
WBC: 0.5 10*3/uL — CL (ref 4.0–10.5)
nRBC: 0 % (ref 0.0–0.2)

## 2023-10-04 LAB — PHOSPHORUS: Phosphorus: 1.7 mg/dL — ABNORMAL LOW (ref 2.5–4.6)

## 2023-10-04 LAB — MAGNESIUM: Magnesium: 2.9 mg/dL — ABNORMAL HIGH (ref 1.7–2.4)

## 2023-10-04 MED ORDER — MUSCLE RUB 10-15 % EX CREA
TOPICAL_CREAM | CUTANEOUS | Status: DC | PRN
  Filled 2023-10-04: qty 85

## 2023-10-04 MED ORDER — FILGRASTIM 480 MCG/1.6ML IJ SOLN: 480.0000 ug | Freq: Every day | INTRAMUSCULAR | Status: DC

## 2023-10-04 MED ORDER — SODIUM CHLORIDE 1 G PO TABS
1.0000 g | ORAL_TABLET | Freq: Two times a day (BID) | ORAL | Status: DC
  Administered 2023-10-04 – 2023-10-06 (×5): 1 g via ORAL
  Filled 2023-10-04 (×5): qty 1

## 2023-10-04 MED ORDER — ENSURE ENLIVE PO LIQD
237.0000 mL | Freq: Two times a day (BID) | ORAL | Status: DC
  Administered 2023-10-04 – 2023-10-05 (×4): 237 mL via ORAL

## 2023-10-04 MED ORDER — POTASSIUM PHOSPHATES 15 MMOLE/5ML IV SOLN
30.0000 mmol | Freq: Once | INTRAVENOUS | Status: AC
  Administered 2023-10-04: 30 mmol via INTRAVENOUS
  Filled 2023-10-04: qty 10

## 2023-10-04 MED ORDER — POTASSIUM CHLORIDE CRYS ER 20 MEQ PO TBCR
40.0000 meq | EXTENDED_RELEASE_TABLET | ORAL | Status: AC
  Administered 2023-10-04 (×2): 40 meq via ORAL
  Filled 2023-10-04 (×2): qty 2

## 2023-10-04 NOTE — Plan of Care (Signed)

## 2023-10-04 NOTE — Progress Notes (Signed)
Progress Note   Patient: Tonya [name] UJW:[id number] DOB: May 29, 1966 DOA: 10/02/2023     1 DOS: the patient was seen and examined on 10/04/2023   Brief hospital course: [name] is a 58 year old female with history of cervical cancer on chemotherapy, hypertension, GERD, non-insulin-dependent diabetes mellitus, hyperlipidemia, who presents emergency department for chief concerns of diarrhea, congestion, weakness. She meets sepsis criteria with tachycardia and significant leukocytosis, lactic acid 3.3.  Patient also had hyponatremia, metabolic acidosis.  Patient was given fluids, she also received broad-spectrum antibiotics.  Stool study came back with C. difficile toxin positive.  Treated with IV Flagyl and oral vancomycin.  Patient also developed neutropenia, oncology consult obtained.  Blood culture in 1 bottle positive for Streptococcus, ID consult also obtained.  Patient is placed on IV Rocephin.    Principal Problem:   Severe sepsis (HCC) Active Problems:   Hypothyroidism (acquired)   Ischemic cardiomyopathy   Essential hypertension   Hyperlipidemia associated with type 2 diabetes mellitus (HCC)   Morbid obesity (HCC)   Diabetes mellitus (HCC)   Coronary artery disease involving native coronary artery of native heart with angina pectoris (HCC)   Endometrial carcinoma (HCC)   Diarrhea   Leukopenia   AKI (acute kidney injury) (HCC)   Hyponatremia   Metabolic acidosis   Neutropenia (HCC)   C. difficile colitis   Hypophosphatemia   Assessment and Plan: *Severe sepsis with Streptococcus septicemia. C. difficile colitis. Patient meet severe sepsis criteria with leukocytosis tachycardia and elevated lactic acid level.  This is mainly caused by C. difficile colitis. Antibiotic has switched to IV Flagyl and oral vancomycin.  Diarrhea has much better today, due to negative C. difficile toxin, uncertain if patient has a true C. difficile colitis.  Discontinue Flagyl, continue  vancomycin for a course. Patient currently hemodynamically stable. Patient blood culture 1 bottle also positive for Streptococcus.  Seen by ID, continued on Rocephin.    Neutropenia. Thrombocytopenia. Endometrial cancer status post chemotherapy. Oncology consult is obtained.   Hyponatremia secondary to SIADH. Metabolic acidosis. Acute kidney injury. Hypokalemia. Hypophosphatemia. Renal function normalized, metabolic acidosis improved.  Sodium level is still low, but this appeared to be chronic.  I placed him fluid restriction, give salt tablets 1 g twice a day. Continue replete potassium and phosphate.   Coronary artery disease involving native coronary artery of native heart with angina pectoris (HCC) Home nitroglycerin as needed for chest pain resumed   Diabetes mellitus (HCC) Continue sliding scale insulin.   Morbid obesity (HCC) This complicates overall care and prognosis.    Essential hypertension As needed blood pressure medicine ordered.   Hypothyroidism (acquired) Home thyroid Armour 125 mg daily before breakfast resumed      Subjective:  Patient doing better, diarrhea is slowing down.  No nausea vomiting.  Physical Exam: Vitals:   10/03/23 1956 10/04/23 0009 10/04/23 0415 10/04/23 0829  BP: (!) 153/79 132/72 (!) 130/57 (!) 148/66  Pulse: 78 84 74 82  Resp: 18   17  Temp: 98.4 F (36.9 C) 98 F (36.7 C) 97.9 F (36.6 C) 97.9 F (36.6 C)  TempSrc:   Axillary Oral  SpO2: 99% 99% 100% 97%  Height:       General exam: Appears calm and comfortable, morbid obese. Respiratory system: Clear to auscultation. Respiratory effort normal. Cardiovascular system: S1 & S2 heard, RRR. No JVD, murmurs, rubs, gallops or clicks. No pedal edema. Gastrointestinal system: Abdomen is nondistended, soft and nontender. No organomegaly or masses felt. Normal bowel sounds  heard. Central nervous system: Alert and oriented. No focal neurological deficits. Extremities: Symmetric  5 x 5 power. Skin: No rashes, lesions or ulcers Psychiatry: Judgement and insight appear normal. Mood & affect appropriate.    Data Reviewed:  Lab results reviewed.  Family Communication: None  Disposition: Status is: Inpatient Remains inpatient appropriate because: Severity of disease, IV treatment.     Time spent: 35 minutes  Author: Marrion Coy, MD 10/04/2023 12:32 PM  For on call review [email or web address].

## 2023-10-05 ENCOUNTER — Other Ambulatory Visit: Payer: BC Managed Care – PPO

## 2023-10-05 ENCOUNTER — Other Ambulatory Visit: Payer: Self-pay | Admitting: *Deleted

## 2023-10-05 DIAGNOSIS — R7881 Bacteremia: Secondary | ICD-10-CM | POA: Diagnosis not present

## 2023-10-05 DIAGNOSIS — A419 Sepsis, unspecified organism: Secondary | ICD-10-CM | POA: Diagnosis not present

## 2023-10-05 DIAGNOSIS — A0472 Enterocolitis due to Clostridium difficile, not specified as recurrent: Secondary | ICD-10-CM | POA: Diagnosis not present

## 2023-10-05 DIAGNOSIS — C7A8 Other malignant neuroendocrine tumors: Secondary | ICD-10-CM

## 2023-10-05 DIAGNOSIS — C541 Malignant neoplasm of endometrium: Secondary | ICD-10-CM | POA: Diagnosis not present

## 2023-10-05 DIAGNOSIS — R6520 Severe sepsis without septic shock: Secondary | ICD-10-CM | POA: Diagnosis not present

## 2023-10-05 DIAGNOSIS — B955 Unspecified streptococcus as the cause of diseases classified elsewhere: Secondary | ICD-10-CM

## 2023-10-05 DIAGNOSIS — D701 Agranulocytosis secondary to cancer chemotherapy: Secondary | ICD-10-CM | POA: Diagnosis not present

## 2023-10-05 LAB — BASIC METABOLIC PANEL
Anion gap: 11 (ref 5–15)
BUN: 34 mg/dL — ABNORMAL HIGH (ref 6–20)
CO2: 23 mmol/L (ref 22–32)
Calcium: 8.7 mg/dL — ABNORMAL LOW (ref 8.9–10.3)
Chloride: 94 mmol/L — ABNORMAL LOW (ref 98–111)
Creatinine, Ser: 0.68 mg/dL (ref 0.44–1.00)
GFR, Estimated: >60 mL/min (ref >60)
Glucose, Bld: 112 mg/dL — ABNORMAL HIGH (ref 70–99)
Potassium: 3.8 mmol/L (ref 3.5–5.1)
Sodium: 128 mmol/L — ABNORMAL LOW (ref 135–145)

## 2023-10-05 LAB — CBC
HCT: 25.8 % — ABNORMAL LOW (ref 36.0–46.0)
Hemoglobin: 9 g/dL — ABNORMAL LOW (ref 12.0–15.0)
MCH: 31.4 pg (ref 26.0–34.0)
MCHC: 34.9 g/dL (ref 30.0–36.0)
MCV: 89.9 fL (ref 80.0–100.0)
Platelets: 40 10*3/uL — ABNORMAL LOW (ref 150–400)
RBC: 2.87 MIL/uL — ABNORMAL LOW (ref 3.87–5.11)
RDW: 13.9 % (ref 11.5–15.5)
WBC: 0.2 10*3/uL — CL (ref 4.0–10.5)
nRBC: 0 % (ref 0.0–0.2)

## 2023-10-05 LAB — GLUCOSE, CAPILLARY
Glucose-Capillary: 115 mg/dL — ABNORMAL HIGH (ref 70–99)
Glucose-Capillary: 131 mg/dL — ABNORMAL HIGH (ref 70–99)
Glucose-Capillary: 125 mg/dL — ABNORMAL HIGH (ref 70–99)
Glucose-Capillary: 107 mg/dL — ABNORMAL HIGH (ref 70–99)
Glucose-Capillary: 149 mg/dL — ABNORMAL HIGH (ref 70–99)

## 2023-10-05 LAB — PHOSPHORUS: Phosphorus: 1.9 mg/dL — ABNORMAL LOW (ref 2.5–4.6)

## 2023-10-05 LAB — MAGNESIUM: Magnesium: 2.5 mg/dL — ABNORMAL HIGH (ref 1.7–2.4)

## 2023-10-05 MED ORDER — VANCOMYCIN HCL 125 MG PO CAPS
125.0000 mg | ORAL_CAPSULE | Freq: Four times a day (QID) | ORAL | Status: DC
  Administered 2023-10-05 – 2023-10-06 (×5): 125 mg via ORAL
  Filled 2023-10-05 (×6): qty 1

## 2023-10-05 NOTE — TOC Initial Note (Signed)
Transition of Care [facility]) - Initial/Assessment Note    Patient Details  Name: Tonya [name] MRN: [id number] Date of Birth: 1965/11/02  Transition of Care [facility]) CM/SW Contact:    Allena Katz, LCSW Phone Number: 10/05/2023, 2:27 PM  Clinical Narrative:  Pt admitted from home with husband with sepsis. Pt getting chemo prior to admission. Pt is active with Dr. Hyacinth Meeker for primary care. Pt uses [facility] pharmacy on [address]. Pt ambulatory independently. No other TOC needs at this time. TOC will continue to follow.         Patient Goals and CMS Choice            Expected Discharge Plan and Services                                              Prior Living Arrangements/Services                       Activities of Daily Living   ADL Screening (condition at time of admission) Independently performs ADLs?: Yes (appropriate for developmental age) Is the patient deaf or have difficulty hearing?: No Does the patient have difficulty seeing, even when wearing glasses/contacts?: No Does the patient have difficulty concentrating, remembering, or making decisions?: No  Permission Sought/Granted                  Emotional Assessment              Admission diagnosis:  C. difficile colitis [A04.72] Severe sepsis with acute organ dysfunction (HCC) [A41.9, R65.20] Sepsis, due to unspecified organism, unspecified whether acute organ dysfunction present [facility]) [A41.9] Patient Active Problem List   Diagnosis Date Noted   Bacteremia due to Streptococcus 10/05/2023   Hypophosphatemia 10/04/2023   AKI (acute kidney injury) (HCC) 10/03/2023   Hyponatremia 10/03/2023   Metabolic acidosis 10/03/2023   Neutropenia (HCC) 10/03/2023   C. 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) 09/21/2023   Endometrial carcinoma (HCC) 09/21/2023    Coronary artery disease involving native coronary artery of native heart with angina pectoris (HCC) 07/24/2022   Ischemic cardiomyopathy 08/09/2019   Essential hypertension 08/09/2019   Hyperlipidemia associated with type 2 diabetes mellitus (HCC) 08/09/2019   Morbid obesity (HCC) 08/09/2019   Diabetes mellitus (HCC) 08/09/2019   NSTEMI (non-ST elevated myocardial infarction) (HCC) 07/23/2019   Hypothyroidism (acquired) 07/23/2019   PCP:  Danella Penton, MD Pharmacy:   [id number] [place], [place] - [address] AT [facility] [address] [facility] [address] [place] [address] Phone: [phone] Fax: [phone]     Social Drivers of Health (SDOH) Social History: SDOH Screenings   Food Insecurity: No Food Insecurity (10/03/2023)  Housing: Low Risk  (10/03/2023)  Transportation Needs: No Transportation Needs (10/03/2023)  Utilities: Not At Risk (10/03/2023)  Depression (PHQ2-9): Low Risk  (09/21/2023)  Financial Resource Strain: Low Risk  (07/23/2019)  Physical Activity: Unknown (07/23/2019)  Stress: No Stress Concern Present (07/23/2019)  Tobacco Use: Low Risk  (10/03/2023)  Health Literacy: Adequate Health Literacy (09/29/2023)   SDOH Interventions:     Readmission Risk Interventions    10/05/2023    2:26 PM  Readmission Risk Prevention Plan  Transportation Screening Complete  HRI or Home Care Consult Complete  Palliative Care Screening Complete  Medication Review (RN Care Manager) Complete

## 2023-10-05 NOTE — Progress Notes (Signed)
Date of Admission:  10/02/2023     ID: Tonya [name] is a 58 y.o. female Principal Problem:   Severe sepsis (HCC) Active Problems:   Hypothyroidism (acquired)   Ischemic cardiomyopathy   Essential hypertension   Hyperlipidemia associated with type 2 diabetes mellitus (HCC)   Morbid obesity (HCC)   Diabetes mellitus (HCC)   Coronary artery disease involving native coronary artery of native heart with angina pectoris (HCC)   Endometrial carcinoma (HCC)   Diarrhea   Leukopenia   AKI (acute kidney injury) (HCC)   Hyponatremia   Metabolic acidosis   Neutropenia (HCC)   C. difficile colitis   Hypophosphatemia    Subjective: Diarrhea much improved Has abdominal fullness No pain  Medications:   Chlorhexidine Gluconate Cloth  6 each Topical Daily   feeding supplement  237 mL Oral BID BM   heparin  5,000 Units Subcutaneous Q8H   insulin aspart  0-15 Units Subcutaneous TID WC   insulin aspart  0-5 Units Subcutaneous QHS   multivitamin with minerals  1 tablet Oral Daily   sodium chloride  1 g Oral BID WC   thyroid  120 mg Oral QAC breakfast   vancomycin  500 mg Oral Q6H    Objective: Vital signs in last 24 hours: Patient Vitals for the past 24 hrs:  BP Temp Temp src Pulse Resp SpO2  10/04/23 2042 (!) 167/85 97.8 F (36.6 C) -- 78 18 99 %  10/04/23 1646 (!) 152/86 97.7 F (36.5 C) -- 87 18 98 %  10/04/23 0829 (!) 148/66 97.9 F (36.6 C) Oral 82 17 97 %  10/04/23 0415 (!) 130/57 97.9 F (36.6 C) Axillary 74 -- 100 %  10/04/23 0009 132/72 98 F (36.7 C) -- 84 -- 99 %       PHYSICAL EXAM:  General: Alert, cooperative, no distress, appears stated age.  Head: Normocephalic, without obvious abnormality, atraumatic. Eyes: Conjunctivae clear, anicteric sclerae. Pupils are equal ENT Nares normal. No drainage or sinus tenderness. Lips, mucosa, and tongue normal. No Thrush Neck: Supple, symmetrical, no adenopathy, thyroid: non tender no carotid bruit and no JVD. Back: No  CVA tenderness. Lungs: Clear to auscultation bilaterally. No Wheezing or Rhonchi. No rales. Heart: Regular rate and rhythm, no murmur, rub or gallop. Abdomen: Soft, distended. Bowel sounds normal. No masses Extremities: atraumatic, no cyanosis. No edema. No clubbing Skin: No rashes or lesions. Or bruising Lymph: Cervical, supraclavicular normal. Neurologic: Grossly non-focal  Lab Results    Latest Ref Rng & Units 10/04/2023    5:42 AM 10/03/2023    6:33 AM 10/02/2023    4:13 PM  CBC  WBC 4.0 - 10.5 K/uL 0.5  0.5  1.0   Hemoglobin 12.0 - 15.0 g/dL 16.1  09.6  04.5   Hematocrit 36.0 - 46.0 % 33.3  38.2  47.4   Platelets 150 - 400 K/uL 78  138  145        Latest Ref Rng & Units 10/04/2023    5:42 AM 10/03/2023    6:33 AM 10/02/2023    3:36 PM  CMP  Glucose 70 - 99 mg/dL 409  811  914   BUN 6 - 20 mg/dL 38  47  46   Creatinine 0.44 - 1.00 mg/dL 7.82  9.56  2.13   Sodium 135 - 145 mmol/L 125  126  125   Potassium 3.5 - 5.1 mmol/L 3.3  4.2  4.6   Chloride 98 - 111 mmol/L 92  96  94   CO2 22 - 32 mmol/L 24  20  16    Calcium 8.9 - 10.3 mg/dL 8.3  9.1  9.3   Total Protein 6.5 - 8.1 g/dL   6.7   Total Bilirubin 0.0 - 1.2 mg/dL   1.5   Alkaline Phos 38 - 126 U/L   106   AST 15 - 41 U/L   43   ALT 0 - 44 U/L   24       Microbiology:  Studies/Results: No results found.   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

## 2023-10-05 NOTE — Plan of Care (Signed)
Pt having small bouts of diarrhea. Pt receiving IV Rocephin and PO Vanco for bacteria in the blood. Pt sat up in chair today and vital signs stable.  Problem: Fluid Volume: Goal: Hemodynamic stability will improve 10/05/2023 1639 by Daneen Schick, RN Outcome: Progressing 10/05/2023 1639 by Merlene Pulling, Riley Nearing, RN Outcome: Progressing 10/05/2023 1638 by Merlene Pulling, Riley Nearing, RN Outcome: Progressing   Problem: Clinical Measurements: Goal: Diagnostic test results will improve 10/05/2023 1639 by de Talbert Cage, RN Outcome: Progressing 10/05/2023 1639 by Merlene Pulling, Riley Nearing, RN Outcome: Progressing Goal: Signs and symptoms of infection will decrease 10/05/2023 1639 by Daneen Schick, RN Outcome: Progressing 10/05/2023 1639 by Merlene Pulling, Riley Nearing, RN Outcome: Progressing   Problem: Respiratory: Goal: Ability to maintain adequate ventilation will improve 10/05/2023 1639 by Daneen Schick, RN Outcome: Progressing 10/05/2023 1639 by Merlene Pulling, Riley Nearing, RN Outcome: Progressing   Problem: Education: Goal: Ability to describe self-care measures that may prevent or decrease complications (Diabetes Survival Skills Education) will improve 10/05/2023 1639 by Daneen Schick, RN Outcome: Progressing 10/05/2023 1639 by Merlene Pulling, Riley Nearing, RN Outcome: Progressing Goal: Individualized Educational Video(s) 10/05/2023 1639 by Merlene Pulling, Riley Nearing, RN Outcome: Progressing 10/05/2023 1639 by Merlene Pulling, Riley Nearing, RN Outcome: Progressing   Problem: Coping: Goal: Ability to adjust to condition or change in health will improve 10/05/2023 1639 by de Talbert Cage, RN Outcome: Progressing 10/05/2023 1639 by Merlene Pulling, Riley Nearing, RN Outcome: Progressing   Problem: Fluid Volume: Goal: Ability to maintain a balanced intake and output will improve 10/05/2023 1639 by Daneen Schick, RN Outcome: Progressing 10/05/2023 1639 by Merlene Pulling, Riley Nearing, RN Outcome:  Progressing   Problem: Health Behavior/Discharge Planning: Goal: Ability to identify and utilize available resources and services will improve 10/05/2023 1639 by de Talbert Cage, RN Outcome: Progressing 10/05/2023 1639 by Merlene Pulling, Riley Nearing, RN Outcome: Progressing Goal: Ability to manage health-related needs will improve 10/05/2023 1639 by de Talbert Cage, RN Outcome: Progressing 10/05/2023 1639 by Merlene Pulling, Riley Nearing, RN Outcome: Progressing   Problem: Metabolic: Goal: Ability to maintain appropriate glucose levels will improve 10/05/2023 1639 by Daneen Schick, RN Outcome: Progressing 10/05/2023 1639 by Merlene Pulling, Riley Nearing, RN Outcome: Progressing   Problem: Nutritional: Goal: Maintenance of adequate nutrition will improve 10/05/2023 1639 by Daneen Schick, RN Outcome: Progressing 10/05/2023 1639 by Merlene Pulling, Riley Nearing, RN Outcome: Progressing Goal: Progress toward achieving an optimal weight will improve 10/05/2023 1639 by Daneen Schick, RN Outcome: Progressing 10/05/2023 1639 by Merlene Pulling, Riley Nearing, RN Outcome: Progressing   Problem: Skin Integrity: Goal: Risk for impaired skin integrity will decrease 10/05/2023 1639 by Daneen Schick, RN Outcome: Progressing 10/05/2023 1639 by Merlene Pulling, Riley Nearing, RN Outcome: Progressing   Problem: Tissue Perfusion: Goal: Adequacy of tissue perfusion will improve 10/05/2023 1639 by Daneen Schick, RN Outcome: Progressing 10/05/2023 1639 by Merlene Pulling, Riley Nearing, RN Outcome: Progressing   Problem: Education: Goal: Knowledge of General Education information will improve Description: Including pain rating scale, medication(s)/side effects and non-pharmacologic comfort measures 10/05/2023 1639 by Daneen Schick, RN Outcome: Progressing 10/05/2023 1639 by Merlene Pulling, Riley Nearing, RN Outcome: Progressing   Problem: Health Behavior/Discharge Planning: Goal: Ability to manage health-related needs  will improve 10/05/2023 1639 by Daneen Schick, RN Outcome: Progressing 10/05/2023 1639 by Merlene Pulling, Riley Nearing, RN Outcome: Progressing  Problem: Clinical Measurements: Goal: Ability to maintain clinical measurements within normal limits will improve 10/05/2023 1639 by de Talbert Cage, RN Outcome: Progressing 10/05/2023 1639 by Merlene Pulling, Riley Nearing, RN Outcome: Progressing Goal: Will remain free from infection 10/05/2023 1639 by de Talbert Cage, RN Outcome: Progressing 10/05/2023 1639 by Merlene Pulling, Riley Nearing, RN Outcome: Progressing Goal: Diagnostic test results will improve 10/05/2023 1639 by de Talbert Cage, RN Outcome: Progressing 10/05/2023 1639 by Merlene Pulling, Riley Nearing, RN Outcome: Progressing Goal: Respiratory complications will improve 10/05/2023 1639 by Daneen Schick, RN Outcome: Progressing 10/05/2023 1639 by Merlene Pulling, Riley Nearing, RN Outcome: Progressing Goal: Cardiovascular complication will be avoided 10/05/2023 1639 by Daneen Schick, RN Outcome: Progressing 10/05/2023 1639 by Merlene Pulling, Riley Nearing, RN Outcome: Progressing   Problem: Activity: Goal: Risk for activity intolerance will decrease 10/05/2023 1639 by Daneen Schick, RN Outcome: Progressing 10/05/2023 1639 by Merlene Pulling, Riley Nearing, RN Outcome: Progressing   Problem: Nutrition: Goal: Adequate nutrition will be maintained 10/05/2023 1639 by Daneen Schick, RN Outcome: Progressing 10/05/2023 1639 by Merlene Pulling, Riley Nearing, RN Outcome: Progressing   Problem: Coping: Goal: Level of anxiety will decrease 10/05/2023 1639 by Daneen Schick, RN Outcome: Progressing 10/05/2023 1639 by Merlene Pulling, Riley Nearing, RN Outcome: Progressing   Problem: Elimination: Goal: Will not experience complications related to bowel motility 10/05/2023 1639 by Daneen Schick, RN Outcome: Progressing 10/05/2023 1639 by Merlene Pulling, Riley Nearing, RN Outcome: Progressing Goal: Will not  experience complications related to urinary retention 10/05/2023 1639 by de Talbert Cage, RN Outcome: Progressing 10/05/2023 1639 by Merlene Pulling, Riley Nearing, RN Outcome: Progressing   Problem: Pain Managment: Goal: General experience of comfort will improve and/or be controlled 10/05/2023 1639 by Daneen Schick, RN Outcome: Progressing 10/05/2023 1639 by Merlene Pulling, Riley Nearing, RN Outcome: Progressing   Problem: Safety: Goal: Ability to remain free from injury will improve 10/05/2023 1639 by de Talbert Cage, RN Outcome: Progressing 10/05/2023 1639 by Merlene Pulling, Riley Nearing, RN Outcome: Progressing   Problem: Skin Integrity: Goal: Risk for impaired skin integrity will decrease 10/05/2023 1639 by Daneen Schick, RN Outcome: Progressing 10/05/2023 1639 by Merlene Pulling, Riley Nearing, RN Outcome: Progressing

## 2023-10-05 NOTE — Progress Notes (Addendum)
Date of Admission:  10/02/2023     ID: Tonya [name] is a 58 y.o. female Principal Problem:   Severe sepsis (HCC) Active Problems:   Hypothyroidism (acquired)   Ischemic cardiomyopathy   Essential hypertension   Hyperlipidemia associated with type 2 diabetes mellitus (HCC)   Morbid obesity (HCC)   Diabetes mellitus (HCC)   Coronary artery disease involving native coronary artery of native heart with angina pectoris (HCC)   Endometrial carcinoma (HCC)   Diarrhea   Leukopenia   AKI (acute kidney injury) (HCC)   Hyponatremia   Metabolic acidosis   Neutropenia (HCC)   C. difficile colitis   Hypophosphatemia   Bacteremia due to Streptococcus    Subjective: Diarrhea much improved Eating better No fever or chills   Medications:   Chlorhexidine Gluconate Cloth  6 each Topical Daily   feeding supplement  237 mL Oral BID BM   heparin  5,000 Units Subcutaneous Q8H   insulin aspart  0-15 Units Subcutaneous TID WC   insulin aspart  0-5 Units Subcutaneous QHS   multivitamin with minerals  1 tablet Oral Daily   sodium chloride  1 g Oral BID WC   thyroid  120 mg Oral QAC breakfast   vancomycin  500 mg Oral Q6H    Objective: Vital signs in last 24 hours: Patient Vitals for the past 24 hrs:  BP Temp Temp src Pulse Resp SpO2  10/05/23 0755 (!) 140/79 97.6 F (36.4 C) Oral 93 18 98 %  10/05/23 0536 (!) 152/79 97.9 F (36.6 C) -- 86 18 98 %  10/04/23 2042 (!) 167/85 97.8 F (36.6 C) -- 78 18 99 %  10/04/23 1646 (!) 152/86 97.7 F (36.5 C) -- 87 18 98 %       PHYSICAL EXAM:  General: Alert, cooperative, no distress, appears stated age.  Head: Normocephalic, without obvious abnormality, atraumatic. Eyes: Conjunctivae clear, anicteric sclerae. Pupils are equal ENT Nares normal. No drainage or sinus tenderness. Lips, mucosa, and tongue normal. No Thrush Neck: Supple, symmetrical, no adenopathy, thyroid: non tender no carotid bruit and no JVD. Back: No CVA  tenderness. Lungs: Clear to auscultation bilaterally. No Wheezing or Rhonchi. No rales. Heart: Regular rate and rhythm, no murmur, rub or gallop. Abdomen: Soft,fullness. Bowel sounds normal. No masses Extremities: atraumatic, no cyanosis. No edema. No clubbing Skin: No rashes or lesions. Or bruising Lymph: Cervical, supraclavicular normal. Neurologic: Grossly non-focal  Lab Results    Latest Ref Rng & Units 10/05/2023    5:45 AM 10/04/2023    5:42 AM 10/03/2023    6:33 AM  CBC  WBC 4.0 - 10.5 K/uL 0.2  0.5  0.5   Hemoglobin 12.0 - 15.0 g/dL 9.0  16.1  09.6   Hematocrit 36.0 - 46.0 % 25.8  33.3  38.2   Platelets 150 - 400 K/uL 40  78  138        Latest Ref Rng & Units 10/05/2023    5:45 AM 10/04/2023    5:42 AM 10/03/2023    6:33 AM  CMP  Glucose 70 - 99 mg/dL 045  409  811   BUN 6 - 20 mg/dL 34  38  47   Creatinine 0.44 - 1.00 mg/dL 9.14  7.82  9.56   Sodium 135 - 145 mmol/L 128  125  126   Potassium 3.5 - 5.1 mmol/L 3.8  3.3  4.2   Chloride 98 - 111 mmol/L 94  92  96   CO2 22 -  32 mmol/L 23  24  20    Calcium 8.9 - 10.3 mg/dL 8.7  8.3  9.1       Microbiology: 10/02/23 viridans strep 1 bottle of 4 10/05/23 repeat BC Studies/Results: No results found.   Assessment/Plan: Streptococcus bacteremia  1/4- viridans strep in a patient who received chemotherapy and has severe diarrhea and neutropenia  Likely  translocation of bacteria from the GIT into the blood Doubt the port is the source On ceftriaxone day 3 Pt wants to go home  May be able to switch to Po  Amoxicillin 1 gram TID for 5 more days  Chemo therapy induced diarrhea    Neutropenia due to chemo- received pegfigrastim on 2/7- no response yet   Cdiff antigen and PCR positive and toxin negative Infection VS colonization On vanco 500mg  q6 , reduce to 125 mg Q6 X 14 days total   hyponatremia   Poorly differentiated carcinoma of the endometrium/cervix with neuroendocrine feathers with mets to the lymphnodes and  peritoneum Received 1 st chemo for 3 days     DM  Discussed the management with the patient

## 2023-10-05 NOTE — Plan of Care (Signed)

## 2023-10-05 NOTE — Progress Notes (Addendum)
Progress Note   Patient: Tonya [name] GNF:[id number] DOB: Jun 03, 1966 DOA: 10/02/2023     2 DOS: the patient was seen and examined on 10/05/2023   Brief hospital course: [name] is a 58 year old female with history of cervical cancer on chemotherapy, hypertension, GERD, non-insulin-dependent diabetes mellitus, hyperlipidemia, who presents emergency department for chief concerns of diarrhea, congestion, weakness. She meets sepsis criteria with tachycardia and significant leukocytosis, lactic acid 3.3.  Patient also had hyponatremia, metabolic acidosis.  Patient was given fluids, she also received broad-spectrum antibiotics.  Stool study came back with C. difficile toxin positive.  Treated with IV Flagyl and oral vancomycin.  Patient also developed neutropenia, oncology consult obtained.  Blood culture in 1 bottle positive for Streptococcus, ID consult also obtained.  Patient is placed on IV Rocephin.    Principal Problem:   Severe sepsis (HCC) Active Problems:   Hypothyroidism (acquired)   Ischemic cardiomyopathy   Essential hypertension   Hyperlipidemia associated with type 2 diabetes mellitus (HCC)   Morbid obesity (HCC)   Diabetes mellitus (HCC)   Coronary artery disease involving native coronary artery of native heart with angina pectoris (HCC)   Endometrial carcinoma (HCC)   Diarrhea   Leukopenia   AKI (acute kidney injury) (HCC)   Hyponatremia   Metabolic acidosis   Neutropenia (HCC)   C. difficile colitis   Hypophosphatemia   Bacteremia due to Streptococcus   Assessment and Plan: C. difficile colitis. Patient meet severe sepsis criteria with leukocytosis tachycardia and elevated lactic acid level.  This is mainly caused by C. difficile colitis. Antibiotic has switched to IV Flagyl and oral vancomycin.  Diarrhea has much better today, due to negative C. difficile toxin, uncertain if patient has a true C. difficile colitis.  Diarrhea is improving Continuing  vancomycin  *Severe sepsis with Streptococcus septicemia. Patient currently hemodynamically stable. Patient blood culture 1 bottle also positive for Streptococcus.  Seen by ID, continued on Rocephin. Repeat blood cultures drawn today. Sensitivities pending    Neutropenia. Thrombocytopenia. Endometrial cancer status post chemotherapy. Oncology following. Received fulphilia. This is all 2/2 recent chemo. Oncology advises continuing current course, cytopenias expected to last 7-10 days   Hyponatremia secondary to SIADH. Metabolic acidosis. Acute kidney injury. Hypokalemia. Hypophosphatemia. Aki resolved, hyponatremia improving, 128 today from 121 on arrival. Tolerating fluidsContinuing salt tabs for now   Coronary artery disease involving native coronary artery of native heart with angina pectoris (HCC) Home nitroglycerin as needed for chest pain resumed   Diabetes mellitus (HCC) Continue sliding scale insulin.   Morbid obesity (HCC) This complicates overall care and prognosis.    Essential hypertension As needed blood pressure medicine ordered.   Hypothyroidism (acquired) Home thyroid Armour 125 mg daily before breakfast resumed      Subjective:  Patient doing better, no more diarrhea now several loose stools a day, no vomiting, tolerating diet  Physical Exam: Vitals:   10/04/23 1646 10/04/23 2042 10/05/23 0536 10/05/23 0755  BP: (!) 152/86 (!) 167/85 (!) 152/79 (!) 140/79  Pulse: 87 78 86 93  Resp: 18 18 18 18   Temp: 97.7 F (36.5 C) 97.8 F (36.6 C) 97.9 F (36.6 C) 97.6 F (36.4 C)  TempSrc:    Oral  SpO2: 98% 99% 98% 98%  Height:       General exam: Appears calm and comfortable,  Respiratory system: Clear to auscultation. Respiratory effort normal. Cardiovascular system: S1 & S2 heard, RRR.   Gastrointestinal system: Abdomen is nondistended, soft and nontender.   Central  nervous system: Alert and oriented. No focal neurological deficits. Extremities:  Symmetric 5 x 5 power. Trace edema LE Skin: No rashes, lesions or ulcers Psychiatry: Judgement and insight appear normal. Mood & affect appropriate.    Data Reviewed:  Lab results reviewed.  Family Communication: None  Dvt ppx: SCDs  Disposition: Status is: Inpatient Remains inpatient appropriate because: IV abx awaiting sensitivities.      Author: Silvano Bilis, MD 10/05/2023 1:30 PM  For on call review [email or web address].

## 2023-10-06 ENCOUNTER — Inpatient Hospital Stay: Payer: BC Managed Care – PPO | Admitting: Hospice and Palliative Medicine

## 2023-10-06 ENCOUNTER — Inpatient Hospital Stay: Payer: BC Managed Care – PPO

## 2023-10-06 DIAGNOSIS — A419 Sepsis, unspecified organism: Secondary | ICD-10-CM | POA: Diagnosis not present

## 2023-10-06 DIAGNOSIS — R6520 Severe sepsis without septic shock: Secondary | ICD-10-CM | POA: Diagnosis not present

## 2023-10-06 LAB — BASIC METABOLIC PANEL
Anion gap: 10 (ref 5–15)
BUN: 33 mg/dL — ABNORMAL HIGH (ref 6–20)
CO2: 23 mmol/L (ref 22–32)
Calcium: 8.5 mg/dL — ABNORMAL LOW (ref 8.9–10.3)
Chloride: 95 mmol/L — ABNORMAL LOW (ref 98–111)
Creatinine, Ser: 0.56 mg/dL (ref 0.44–1.00)
GFR, Estimated: >60 mL/min (ref >60)
Glucose, Bld: 132 mg/dL — ABNORMAL HIGH (ref 70–99)
Potassium: 3.4 mmol/L — ABNORMAL LOW (ref 3.5–5.1)
Sodium: 128 mmol/L — ABNORMAL LOW (ref 135–145)

## 2023-10-06 LAB — CBC
HCT: 30.8 % — ABNORMAL LOW (ref 36.0–46.0)
Hemoglobin: 10.8 g/dL — ABNORMAL LOW (ref 12.0–15.0)
MCH: 31.3 pg (ref 26.0–34.0)
MCHC: 35.1 g/dL (ref 30.0–36.0)
MCV: 89.3 fL (ref 80.0–100.0)
Platelets: 25 10*3/uL — CL (ref 150–400)
RBC: 3.45 MIL/uL — ABNORMAL LOW (ref 3.87–5.11)
RDW: 13.9 % (ref 11.5–15.5)
WBC: 0.2 10*3/uL — CL (ref 4.0–10.5)
nRBC: 0 % (ref 0.0–0.2)

## 2023-10-06 LAB — GLUCOSE, CAPILLARY: Glucose-Capillary: 125 mg/dL — ABNORMAL HIGH (ref 70–99)

## 2023-10-06 MED ORDER — AMOXICILLIN 500 MG PO TABS: 1000.0000 mg | ORAL_TABLET | Freq: Three times a day (TID) | ORAL | 0 refills | Status: DC

## 2023-10-06 MED ORDER — HEPARIN SOD (PORK) LOCK FLUSH 100 UNIT/ML IV SOLN
500.0000 [IU] | Freq: Once | INTRAVENOUS | Status: AC
Start: 2023-10-06 — End: 2023-10-06
  Administered 2023-10-06: 500 [IU] via INTRAVENOUS
  Filled 2023-10-06: qty 5

## 2023-10-06 MED ORDER — VANCOMYCIN HCL 125 MG PO CAPS: 125.0000 mg | ORAL_CAPSULE | Freq: Four times a day (QID) | ORAL | 0 refills | Status: DC

## 2023-10-06 MED ORDER — SODIUM CHLORIDE 1 G PO TABS: 1.0000 g | ORAL_TABLET | Freq: Two times a day (BID) | ORAL | 1 refills | Status: DC

## 2023-10-06 NOTE — Discharge Summary (Signed)
Tonya [name] ZOX:[id number] DOB: [birth year] DOA: 10/02/2023  PCP: Danella Penton, MD  Admit date: 10/02/2023 Discharge date: 10/06/2023  Time spent: 35 minutes  Recommendations for Outpatient Follow-up:  Oncology f/u 2/17 Pcp f/u Monitor sodium, discontinue salt tabs as needed Monitor platelets, wbcs, etc.     Discharge Diagnoses:  Principal Problem:   Severe sepsis (HCC) Active Problems:   Hypothyroidism (acquired)   Ischemic cardiomyopathy   Essential hypertension   Hyperlipidemia associated with type 2 diabetes mellitus (HCC)   Morbid obesity (HCC)   Diabetes mellitus (HCC)   Coronary artery disease involving native coronary artery of native heart with angina pectoris (HCC)   Endometrial carcinoma (HCC)   Diarrhea   Leukopenia   AKI (acute kidney injury) (HCC)   Hyponatremia   Metabolic acidosis   Neutropenia (HCC)   C. difficile colitis   Hypophosphatemia   Bacteremia due to Streptococcus   Discharge Condition: stable  Diet recommendation: heart healthy  There were no vitals filed for this visit.  History of present illness:  From admission h and p: [name] is a 58 year old female with history of cervical cancer on chemotherapy, hypertension, GERD, non-insulin-dependent diabetes mellitus, hyperlipidemia, who presents emergency department for chief concerns of diarrhea, congestion, weakness.   She reports she just had profuse liquid bowel movements that started today.  She was taking a shower and had a shower chair and she just filled it up with stool every time she stood up, the stool just dripped out of her.  She reports the color of the stool is brown/yellow and watery.   Patient reports that she has been constipated and over the last 2 to 3 days, she has been drinking a lot of prune juice and taking MiraLAX.  She reports that over the last 3 weeks she has not been able to take in any solid food and she has only been doing a liquid diet.  She reports that  her mouth has been so dry that she cannot produce any saliva therefore she has been drinking a lot of water.   She denies fever, nausea, vomiting, chest pain at home.  She reports that she developed shortness of breath, prompting her to be come to the emergency department today.   She reports no recent antibiotic use.  Hospital Course:  Patient with recently diagnosed endometrial cancer who recently received her first round of chemotherapy. Presented with diarrhea. Found to have c diff colitis, appears mild, and has improved on oral vancomycin - will complete a 14 day course. Also with one blood culture positive for strep, ID consulted, likely gut translocation. Treated with ceftriaxone, transitioned to amoxicillin at discharge to complete a course. Also with leukopenia and thrombocytopenia 2/2 chemotherapy, patient received fulphilia, oncology aware and are OK with discharge, they will arrange close follow-up next week with labs. Also with new hyponatremia likely siadh from malignancy, treated here with salt tabs and sodium has up-trended, will continue salt tabs at discharge, patient will need close monitoring of sodium levels as outpatient. AKI on presentation resolved. Patient is ambulating without assistance and is confident she can manage self-care at home. Other chronic medical problems stable.   Procedures: none   Consultations: oncology  Discharge Exam: Vitals:   10/06/23 0429 10/06/23 0822  BP: (!) 152/75 (!) 157/82  Pulse: 97 (!) 107  Resp: 16 16  Temp: 98.1 F (36.7 C) 97.9 F (36.6 C)  SpO2: 97% 95%    General: NAD Cardiovascular: RRR Respiratory: CTAB Abd:  soft, non-tender  Discharge Instructions   Discharge Instructions     Diet - low sodium heart healthy   Complete by: As directed    Increase activity slowly   Complete by: As directed       Allergies as of 10/06/2023   No Known Allergies      Medication List     STOP taking these medications     nitroGLYCERIN 0.4 MG SL tablet Commonly known as: NITROSTAT   rosuvastatin 10 MG tablet Commonly known as: CRESTOR       TAKE these medications    amoxicillin 500 MG tablet Commonly known as: AMOXIL Take 2 tablets (1,000 mg total) by mouth 3 (three) times daily for 4 days. Start on 2/14   bisacodyl 10 MG suppository Commonly known as: DULCOLAX Place rectally.   dexamethasone 4 MG tablet Commonly known as: DECADRON Take 2 tabs by mouth starting day after last dose of etoposide for one day only. Repeat every 21 days. Take with food.   hyoscyamine 0.125 MG SL tablet Commonly known as: LEVSIN SL Place 0.125 mg under the tongue every 6 (six) hours as needed for cramping.   lidocaine-prilocaine cream Commonly known as: EMLA Apply to affected area once   losartan 25 MG tablet Commonly known as: COZAAR Take 50 mg by mouth daily.   Multi-Vitamin tablet Take 1 tablet by mouth daily.   ondansetron 8 MG tablet Commonly known as: Zofran Take 1 tablet (8 mg total) by mouth every 8 (eight) hours as needed for nausea, vomiting or refractory nausea / vomiting. Start on the third day after carboplatin.   oxyCODONE 5 MG immediate release tablet Commonly known as: Oxy IR/ROXICODONE Take 5 mg by mouth 3 (three) times daily as needed for moderate pain (pain score 4-6).   prochlorperazine 10 MG tablet Commonly known as: COMPAZINE Take 1 tablet (10 mg total) by mouth every 6 (six) hours as needed for nausea or vomiting.   sodium chloride 1 g tablet Take 1 tablet (1 g total) by mouth 2 (two) times daily with a meal.   thyroid 120 MG tablet Commonly known as: ARMOUR Take 120 mg by mouth daily before breakfast.   vancomycin 125 MG capsule Commonly known as: VANCOCIN Take 1 capsule (125 mg total) by mouth every 6 (six) hours for 10 days.   Vitamin D (Ergocalciferol) 1.25 MG (50000 UNIT) Caps capsule Commonly known as: DRISDOL Take 50,000 Units by mouth once a week.        No Known Allergies  Follow-up Information     Creig Hines, MD Follow up.   Specialty: Oncology Why: her office will be contacting you to arrange f/u on 2/17 Contact information: [address] [place] [address] [phone]         Danella Penton, MD Follow up.   Specialty: Internal Medicine Contact information: [phone] [facility] [address] [facility] [place] [address] [phone]                  The results of significant diagnostics from this hospitalization (including imaging, microbiology, ancillary and laboratory) are listed below for reference.    Significant Diagnostic Studies: DG Chest Port 1 View Result Date: 10/02/2023 CLINICAL DATA:  Diarrhea. Recently started chemotherapy for cervical cancer. EXAM: PORTABLE CHEST 1 VIEW COMPARISON:  Chest x-ray dated July 23, 2019. FINDINGS: Right chest wall port catheter with tip at the cavoatrial junction. The heart size and mediastinal contours are within normal limits.  Normal pulmonary vascularity. No focal consolidation, pleural effusion, or pneumothorax. No acute osseous abnormality. IMPRESSION: No active disease. Electronically Signed   By: Obie Dredge M.D.   On: 10/02/2023 16:14   IR IMAGING GUIDED PORT INSERTION Result Date: 09/23/2023 INDICATION: chemotherapy administration History of gyn malignancy. EXAM: IMPLANTED PORT A CATH PLACEMENT WITH ULTRASOUND AND FLUOROSCOPIC GUIDANCE MEDICATIONS: 25 mg Benadryl IV. ANESTHESIA/SEDATION: Moderate (conscious) sedation was employed during this procedure. A total of Versed 1 mg and Fentanyl 100 mcg was administered intravenously. Moderate Sedation Time: 21 minutes. The patient's level of consciousness and vital signs were monitored continuously by radiology nursing throughout the procedure under my direct supervision. FLUOROSCOPY TIME:  Fluoroscopic dose; 4 mGy COMPLICATIONS: None immediate. PROCEDURE: The procedure, risks, benefits, and  alternatives were explained to the patient. Questions regarding the procedure were encouraged and answered. The patient understands and consents to the procedure. The RIGHT neck and chest were prepped with chlorhexidine in a sterile fashion, and a sterile drape was applied covering the operative field. Maximum barrier sterile technique with sterile gowns and gloves were used for the procedure. A timeout was performed prior to the initiation of the procedure. Local anesthesia was provided with 1% lidocaine with epinephrine. After creating a small venotomy incision, a micropuncture kit was utilized to access the internal jugular vein under direct, real-time ultrasound guidance. Ultrasound image documentation was performed. The microwire was kinked to measure appropriate catheter length. A subcutaneous port pocket was then created along the upper chest wall utilizing a combination of sharp and blunt dissection. The pocket was irrigated with sterile saline. A single lumen power injectable port was chosen for placement. The 8 Fr catheter was tunneled from the port pocket site to the venotomy incision. The port was placed in the pocket. The external catheter was trimmed to appropriate length. At the venotomy, an 8 Fr peel-away sheath was placed over a guidewire under fluoroscopic guidance. The catheter was then placed through the sheath and the sheath was removed. Final catheter positioning was confirmed and documented with a fluoroscopic spot radiograph. The port was accessed with a Huber needle, aspirated and flushed with heparinized saline. The port pocket incision was closed with interrupted 3-0 Vicryl suture then Dermabond was applied, including at the venotomy incision. Dressings were placed. The patient tolerated the procedure well without immediate post procedural complication. IMPRESSION: Successful placement of a RIGHT internal jugular approach power injectable Port-A-Cath. The tip of the catheter is positioned  within the proximal RIGHT atrium. The catheter is ready for immediate use. Roanna Banning, MD [facility] Electronically Signed   By: Roanna Banning M.D.   On: 09/23/2023 14:42    Microbiology: Recent Results (from the past 240 hours)  Resp panel by RT-PCR (RSV, Flu A&B, Covid) Anterior Nasal Swab     Status: None   Collection Time: 10/02/23  3:36 PM   Specimen: Anterior Nasal Swab  Result Value Ref Range Status   SARS Coronavirus 2 by RT PCR NEGATIVE NEGATIVE Final    Comment: (NOTE) SARS-CoV-2 target nucleic acids are NOT DETECTED.  The SARS-CoV-2 RNA is generally detectable in upper respiratory specimens during the acute phase of infection. The lowest concentration of SARS-CoV-2 viral copies this assay can detect is 138 copies/mL. A negative result does not preclude SARS-Cov-2 infection and should not be used as the sole basis for treatment or other patient management decisions. A negative result may occur with  improper specimen collection/handling, submission of specimen other than nasopharyngeal  swab, presence of viral mutation(s) within the areas targeted by this assay, and inadequate number of viral copies(<138 copies/mL). A negative result must be combined with clinical observations, patient history, and epidemiological information. The expected result is Negative.  Fact Sheet for Patients:  BloggerCourse.com  Fact Sheet for Healthcare Providers:  SeriousBroker.it  This test is no t yet approved or cleared by the [place] FDA and  has been authorized for detection and/or diagnosis of SARS-CoV-2 by FDA under an Emergency Use Authorization (EUA). This EUA will remain  in effect (meaning this test can be used) for the duration of the COVID-19 declaration under Section 564(b)(1) of the Act, 21 U.S.C.section 360bbb-3(b)(1), unless the authorization is terminated  or  revoked sooner.       Influenza A by PCR NEGATIVE NEGATIVE Final   Influenza B by PCR NEGATIVE NEGATIVE Final    Comment: (NOTE) The Xpert Xpress SARS-CoV-2/FLU/RSV plus assay is intended as an aid in the diagnosis of influenza from Nasopharyngeal swab specimens and should not be used as a sole basis for treatment. Nasal washings and aspirates are unacceptable for Xpert Xpress SARS-CoV-2/FLU/RSV testing.  Fact Sheet for Patients: BloggerCourse.com  Fact Sheet for Healthcare Providers: SeriousBroker.it  This test is not yet approved or cleared by the [place] FDA and has been authorized for detection and/or diagnosis of SARS-CoV-2 by FDA under an Emergency Use Authorization (EUA). This EUA will remain in effect (meaning this test can be used) for the duration of the COVID-19 declaration under Section 564(b)(1) of the Act, 21 U.S.C. section 360bbb-3(b)(1), unless the authorization is terminated or revoked.     Resp Syncytial Virus by PCR NEGATIVE NEGATIVE Final    Comment: (NOTE) Fact Sheet for Patients: BloggerCourse.com  Fact Sheet for Healthcare Providers: SeriousBroker.it  This test is not yet approved or cleared by the [place] FDA and has been authorized for detection and/or diagnosis of SARS-CoV-2 by FDA under an Emergency Use Authorization (EUA). This EUA will remain in effect (meaning this test can be used) for the duration of the COVID-19 declaration under Section 564(b)(1) of the Act, 21 U.S.C. section 360bbb-3(b)(1), unless the authorization is terminated or revoked.  Performed at [facility], [address] [place], [place] [address]   Culture, blood (routine x 2)     Status: None (Preliminary result)   Collection Time: 10/02/23  3:36 PM   Specimen: BLOOD  Result Value Ref Range Status   Specimen Description BLOOD RIGHT ANTECUBITAL   Final   Special Requests   Final    BOTTLES DRAWN AEROBIC AND ANAEROBIC Blood Culture results may not be optimal due to an inadequate volume of blood received in culture bottles   Culture   Final    NO GROWTH 4 DAYS Performed at [facility], [address] [place], [place] [address]    Report Status PENDING  Incomplete  Culture, blood (routine x 2)     Status: Abnormal (Preliminary result)   Collection Time: 10/02/23  4:13 PM   Specimen: BLOOD LEFT HAND  Result Value Ref Range Status   Specimen Description   Final    BLOOD LEFT HAND Performed at [facility], [address], [place], [place] [address]    Special Requests   Final    BOTTLES DRAWN AEROBIC AND ANAEROBIC Blood Culture results may not be optimal due to an inadequate volume of blood received in culture bottles Performed at [facility], [address] [place], [place]  [address]    Culture  Setup Time   Final    GRAM POSITIVE COCCI ANAEROBIC BOTTLE ONLY CRITICAL RESULT CALLED TO, READ BACK BY AND VERIFIED WITH: MERRILL, K. 0944 10/03/2023 [facility] AEROBIC BOTTLE ONLY GRAM POSITIVE RODS CRITICAL RESULT CALLED TO, READ BACK BY AND VERIFIED WITH:  Mila Merry AT 0004 10/06/23 JG    Culture (A)  Final    VIRIDANS STREPTOCOCCUS CULTURE REINCUBATED FOR BETTER GROWTH Performed at [facility], [address] [place], [place] [address]    Report Status PENDING  Incomplete  Blood Culture ID Panel (Reflexed)     Status: Abnormal   Collection Time: 10/02/23  4:13 PM  Result Value Ref Range Status   Enterococcus faecalis NOT DETECTED NOT DETECTED Final   Enterococcus Faecium NOT DETECTED NOT DETECTED Final   Listeria monocytogenes NOT DETECTED NOT DETECTED Final   Staphylococcus species NOT DETECTED NOT DETECTED Final   Staphylococcus aureus (BCID) NOT DETECTED NOT DETECTED Final   Staphylococcus epidermidis NOT DETECTED NOT DETECTED Final   Staphylococcus lugdunensis NOT DETECTED NOT DETECTED  Final   Streptococcus species DETECTED (A) NOT DETECTED Final    Comment: Not Enterococcus species, Streptococcus agalactiae, Streptococcus pyogenes, or Streptococcus pneumoniae. CRITICAL RESULT CALLED TO, READ BACK BY AND VERIFIED WITH: MERRILL, K. 0944 10/03/2023 [facility]    Streptococcus agalactiae NOT DETECTED NOT DETECTED Final   Streptococcus pneumoniae NOT DETECTED NOT DETECTED Final   Streptococcus pyogenes NOT DETECTED NOT DETECTED Final   A.calcoaceticus-baumannii NOT DETECTED NOT DETECTED Final   Bacteroides fragilis NOT DETECTED NOT DETECTED Final   Enterobacterales NOT DETECTED NOT DETECTED Final   Enterobacter cloacae complex NOT DETECTED NOT DETECTED Final   Escherichia coli NOT DETECTED NOT DETECTED Final   Klebsiella aerogenes NOT DETECTED NOT DETECTED Final   Klebsiella oxytoca NOT DETECTED NOT DETECTED Final   Klebsiella pneumoniae NOT DETECTED NOT DETECTED Final   Proteus species NOT DETECTED NOT DETECTED Final   Salmonella species NOT DETECTED NOT DETECTED Final   Serratia marcescens NOT DETECTED NOT DETECTED Final   Haemophilus influenzae NOT DETECTED NOT DETECTED Final   Neisseria meningitidis NOT DETECTED NOT DETECTED Final   Pseudomonas aeruginosa NOT DETECTED NOT DETECTED Final   Stenotrophomonas maltophilia NOT DETECTED NOT DETECTED Final   Candida albicans NOT DETECTED NOT DETECTED Final   Candida auris NOT DETECTED NOT DETECTED Final   Candida glabrata NOT DETECTED NOT DETECTED Final   Candida krusei NOT DETECTED NOT DETECTED Final   Candida parapsilosis NOT DETECTED NOT DETECTED Final   Candida tropicalis NOT DETECTED NOT DETECTED Final   Cryptococcus neoformans/gattii NOT DETECTED NOT DETECTED Final    Comment: Performed at [facility], [address] [place], [place] [address]  Gastrointestinal Panel by PCR , Stool     Status: None   Collection Time: 10/02/23  5:54 PM   Specimen: Stool  Result Value Ref Range Status   Campylobacter species  NOT DETECTED NOT DETECTED Final   Plesimonas shigelloides NOT DETECTED NOT DETECTED Final   Salmonella species NOT DETECTED NOT DETECTED Final   Yersinia enterocolitica NOT DETECTED NOT DETECTED Final   Vibrio species NOT DETECTED NOT DETECTED Final   Vibrio cholerae NOT DETECTED NOT DETECTED Final   Enteroaggregative E coli (EAEC) NOT DETECTED NOT DETECTED Final   Enteropathogenic E coli (EPEC) NOT DETECTED NOT DETECTED Final   Enterotoxigenic E coli (ETEC) NOT DETECTED NOT DETECTED Final   Shiga like toxin producing E coli (STEC) NOT DETECTED NOT DETECTED Final  Shigella/Enteroinvasive E coli (EIEC) NOT DETECTED NOT DETECTED Final   Cryptosporidium NOT DETECTED NOT DETECTED Final   Cyclospora cayetanensis NOT DETECTED NOT DETECTED Final   Entamoeba histolytica NOT DETECTED NOT DETECTED Final   Giardia lamblia NOT DETECTED NOT DETECTED Final   Adenovirus F40/41 NOT DETECTED NOT DETECTED Final   Astrovirus NOT DETECTED NOT DETECTED Final   Norovirus GI/GII NOT DETECTED NOT DETECTED Final   Rotavirus A NOT DETECTED NOT DETECTED Final   Sapovirus (I, II, IV, and V) NOT DETECTED NOT DETECTED Final    Comment: Performed at [facility], [address] [place], [place] [address]  C Difficile Quick Screen w PCR reflex     Status: Abnormal   Collection Time: 10/02/23  5:54 PM   Specimen: Stool  Result Value Ref Range Status   C Diff antigen POSITIVE (A) NEGATIVE Final   C Diff toxin NEGATIVE NEGATIVE Final   C Diff interpretation Results are indeterminate. See PCR results.  Final    Comment: Performed at [facility], [address] [place], [place] [address]  C. Diff by PCR, Reflexed     Status: Abnormal   Collection Time: 10/02/23  5:54 PM  Result Value Ref Range Status   Toxigenic C. Difficile by PCR POSITIVE (A) NEGATIVE Final    Comment: Positive for toxigenic C. difficile with little to no toxin production. Only treat if clinical presentation suggests  symptomatic illness. CRITICAL RESULT CALLED TO, READ BACK BY AND VERIFIED WITH: PAM RYAN RN @ 2032 10/02/23 [facility] Performed at [facility], [address] [place], [place] [address]   Culture, blood (Routine X 2) w Reflex to ID Panel     Status: None (Preliminary result)   Collection Time: 10/05/23  6:41 AM   Specimen: BLOOD  Result Value Ref Range Status   Specimen Description BLOOD BLOOD LEFT ARM  Final   Special Requests   Final    BOTTLES DRAWN AEROBIC AND ANAEROBIC Blood Culture adequate volume   Culture   Final    NO GROWTH 1 DAY Performed at [facility], [address] [place], [place] [address]    Report Status PENDING  Incomplete  Culture, blood (Routine X 2) w Reflex to ID Panel     Status: None (Preliminary result)   Collection Time: 10/05/23  6:46 AM   Specimen: BLOOD  Result Value Ref Range Status   Specimen Description BLOOD BLOOD RIGHT HAND  Final   Special Requests   Final    BOTTLES DRAWN AEROBIC AND ANAEROBIC Blood Culture adequate volume   Culture   Final    NO GROWTH 1 DAY Performed at [facility], [address], [place], [place] [address]    Report Status PENDING  Incomplete     Labs: Basic Metabolic Panel: Recent Labs  Lab 10/02/23 1536 10/03/23 0633 10/04/23 0542 10/05/23 0545 10/06/23 0428  NA 125* 126* 125* 128* 128*  K 4.6 4.2 3.3* 3.8 3.4*  CL 94* 96* 92* 94* 95*  CO2 16* 20* 24 23 23   GLUCOSE 250* 155* 106* 112* 132*  BUN 46* 47* 38* 34* 33*  CREATININE 1.08* 0.92 0.74 0.68 0.56  CALCIUM 9.3 9.1 8.3* 8.7* 8.5*  MG 4.4*  --  2.9* 2.5*  --   PHOS  --   --  1.7* 1.9*  --    Liver Function Tests: Recent Labs  Lab 10/02/23 1536  AST 43*  ALT 24  ALKPHOS 106  BILITOT 1.5*  PROT 6.7  ALBUMIN 3.0*   Recent Labs  Lab 10/02/23 1536  LIPASE 45   No results for input(s): "AMMONIA" in the last 168 hours. CBC: Recent Labs  Lab 10/02/23 1613 10/03/23 4098 10/04/23 0542 10/05/23 0545 10/06/23 0428   WBC 1.0* 0.5* 0.5* 0.2* 0.2*  NEUTROABS 0.7* 0.1*  --   --   --   HGB 15.9* 13.4 11.7* 9.0* 10.8*  HCT 47.4* 38.2 33.3* 25.8* 30.8*  MCV 92.6 87.6 88.1 89.9 89.3  PLT 145* 138* 78* 40* 25*   Cardiac Enzymes: No results for input(s): "CKTOTAL", "CKMB", "CKMBINDEX", "TROPONINI" in the last 168 hours. BNP: BNP (last 3 results) No results for input(s): "BNP" in the last 8760 hours.  ProBNP (last 3 results) No results for input(s): "PROBNP" in the last 8760 hours.  CBG: Recent Labs  Lab 10/05/23 0840 10/05/23 1219 10/05/23 1611 10/05/23 2135 10/06/23 0820  GLUCAP 131* 125* 107* 149* 125*       Signed:  Silvano Bilis MD.  [facility] 10/06/2023, 9:56 AM

## 2023-10-06 NOTE — Progress Notes (Signed)
PHARMACY - PHYSICIAN COMMUNICATION CRITICAL VALUE ALERT - BLOOD CULTURE IDENTIFICATION (BCID)  Tonya [name] is an 59 y.o. female who presented to [facility] on 10/02/2023 with a chief complaint of diarrhea on chemotherapy.  Patient has PORT  Assessment:  blood cultures from 2/9 with 1 of 4 bottles(anaerobic) growing GPC(strep viridians) and a separate 1 of 4 bottles(aerobic) growing GPR(sent off to [facility] lab for further speciation  Name of physician (or Provider) Contacted: Larkin Ina, NP  Current antibiotics: Ceftriaxone 2g IV Q24h for strep bacteremia, PO vancomycin for C. Difficile.    Changes to prescribed antibiotics recommended:  Continue with Ceftriaxone. ID team already consult and will re-evaluate in the morning.  Results for orders placed or performed during the hospital encounter of 10/02/23  Blood Culture ID Panel (Reflexed) (Collected: 10/02/2023  4:13 PM)  Result Value Ref Range   Enterococcus faecalis NOT DETECTED NOT DETECTED   Enterococcus Faecium NOT DETECTED NOT DETECTED   Listeria monocytogenes NOT DETECTED NOT DETECTED   Staphylococcus species NOT DETECTED NOT DETECTED   Staphylococcus aureus (BCID) NOT DETECTED NOT DETECTED   Staphylococcus epidermidis NOT DETECTED NOT DETECTED   Staphylococcus lugdunensis NOT DETECTED NOT DETECTED   Streptococcus species DETECTED (A) NOT DETECTED   Streptococcus agalactiae NOT DETECTED NOT DETECTED   Streptococcus pneumoniae NOT DETECTED NOT DETECTED   Streptococcus pyogenes NOT DETECTED NOT DETECTED   A.calcoaceticus-baumannii NOT DETECTED NOT DETECTED   Bacteroides fragilis NOT DETECTED NOT DETECTED   Enterobacterales NOT DETECTED NOT DETECTED   Enterobacter cloacae complex NOT DETECTED NOT DETECTED   Escherichia coli NOT DETECTED NOT DETECTED   Klebsiella aerogenes NOT DETECTED NOT DETECTED   Klebsiella oxytoca NOT DETECTED NOT DETECTED   Klebsiella pneumoniae NOT DETECTED NOT DETECTED   Proteus species NOT DETECTED NOT  DETECTED   Salmonella species NOT DETECTED NOT DETECTED   Serratia marcescens NOT DETECTED NOT DETECTED   Haemophilus influenzae NOT DETECTED NOT DETECTED   Neisseria meningitidis NOT DETECTED NOT DETECTED   Pseudomonas aeruginosa NOT DETECTED NOT DETECTED   Stenotrophomonas maltophilia NOT DETECTED NOT DETECTED   Candida albicans NOT DETECTED NOT DETECTED   Candida auris NOT DETECTED NOT DETECTED   Candida glabrata NOT DETECTED NOT DETECTED   Candida krusei NOT DETECTED NOT DETECTED   Candida parapsilosis NOT DETECTED NOT DETECTED   Candida tropicalis NOT DETECTED NOT DETECTED   Cryptococcus neoformans/gattii NOT DETECTED NOT DETECTED   Juliette Alcide, PharmD, BCPS, BCIDP Work Cell: [phone] 10/06/2023 3:58 AM

## 2023-10-06 NOTE — Progress Notes (Signed)
Critical Platelet 25   previous Platelet 40  RN notified Larkin Ina, NP via phone call.

## 2023-10-06 NOTE — Plan of Care (Signed)

## 2023-10-07 ENCOUNTER — Telehealth: Payer: Self-pay | Admitting: *Deleted

## 2023-10-07 ENCOUNTER — Encounter: Payer: Self-pay | Admitting: Oncology

## 2023-10-07 LAB — CULTURE, BLOOD (ROUTINE X 2): Culture: NO GROWTH

## 2023-10-07 NOTE — Telephone Encounter (Signed)
The patient called today that she is having issues getting up and down from her 2 steps and need transportation . Her husband can help her to get down steps but coming back  the husband gets home about 3:15. She has [employer] shield . She wants [place] to take her to cancer center and back to her house, she is on treatment.I will let sabrina  see what can be done.

## 2023-10-08 ENCOUNTER — Telehealth: Payer: Self-pay | Admitting: Obstetrics and Gynecology

## 2023-10-08 LAB — CULTURE, BLOOD (ROUTINE X 2)

## 2023-10-08 MED ORDER — AMOXICILLIN 500 MG PO TABS: 1000.0000 mg | ORAL_TABLET | Freq: Three times a day (TID) | ORAL | 0 refills | Status: DC

## 2023-10-08 NOTE — Telephone Encounter (Signed)
 Blood culture now growing actinomyces, reviewed w/ dr Rivka Safer, this is pathologic, advises continuing amoxicillin for now, her office will contact patient this coming week to arrange f/u. Ravishankar advised prescribing an additional week of abx to tide patient over, I called her and shared this information, provided contact info for ravishankar's clinic should she not hear something next week.

## 2023-10-10 ENCOUNTER — Emergency Department: Payer: BC Managed Care – PPO

## 2023-10-10 ENCOUNTER — Inpatient Hospital Stay
Admission: EM | Admit: 2023-10-10 | Discharge: 2023-10-14 | DRG: 871 | Disposition: A | Discharge status: Home Health | Payer: BC Managed Care – PPO | Attending: Internal Medicine | Admitting: Internal Medicine

## 2023-10-10 ENCOUNTER — Other Ambulatory Visit: Payer: Self-pay

## 2023-10-10 ENCOUNTER — Inpatient Hospital Stay: Payer: BC Managed Care – PPO

## 2023-10-10 ENCOUNTER — Inpatient Hospital Stay: Payer: BC Managed Care – PPO | Admitting: Hospice and Palliative Medicine

## 2023-10-10 ENCOUNTER — Telehealth: Payer: Self-pay | Admitting: *Deleted

## 2023-10-10 DIAGNOSIS — D696 Thrombocytopenia, unspecified: Secondary | ICD-10-CM | POA: Diagnosis present

## 2023-10-10 DIAGNOSIS — E872 Acidosis, unspecified: Secondary | ICD-10-CM | POA: Diagnosis present

## 2023-10-10 DIAGNOSIS — R188 Other ascites: Secondary | ICD-10-CM | POA: Diagnosis not present

## 2023-10-10 DIAGNOSIS — R0600 Dyspnea, unspecified: Secondary | ICD-10-CM | POA: Diagnosis not present

## 2023-10-10 DIAGNOSIS — C771 Secondary and unspecified malignant neoplasm of intrathoracic lymph nodes: Secondary | ICD-10-CM | POA: Diagnosis present

## 2023-10-10 DIAGNOSIS — Z8249 Family history of ischemic heart disease and other diseases of the circulatory system: Secondary | ICD-10-CM

## 2023-10-10 DIAGNOSIS — S32010A Wedge compression fracture of first lumbar vertebra, initial encounter for closed fracture: Secondary | ICD-10-CM | POA: Insufficient documentation

## 2023-10-10 DIAGNOSIS — A0472 Enterocolitis due to Clostridium difficile, not specified as recurrent: Secondary | ICD-10-CM | POA: Diagnosis present

## 2023-10-10 DIAGNOSIS — D61818 Other pancytopenia: Secondary | ICD-10-CM | POA: Diagnosis not present

## 2023-10-10 DIAGNOSIS — E119 Type 2 diabetes mellitus without complications: Secondary | ICD-10-CM

## 2023-10-10 DIAGNOSIS — I110 Hypertensive heart disease with heart failure: Secondary | ICD-10-CM | POA: Diagnosis present

## 2023-10-10 DIAGNOSIS — E8809 Other disorders of plasma-protein metabolism, not elsewhere classified: Secondary | ICD-10-CM | POA: Diagnosis present

## 2023-10-10 DIAGNOSIS — C541 Malignant neoplasm of endometrium: Secondary | ICD-10-CM | POA: Diagnosis present

## 2023-10-10 DIAGNOSIS — J189 Pneumonia, unspecified organism: Secondary | ICD-10-CM | POA: Diagnosis present

## 2023-10-10 DIAGNOSIS — A409 Streptococcal sepsis, unspecified: Principal | ICD-10-CM | POA: Diagnosis present

## 2023-10-10 DIAGNOSIS — R5383 Other fatigue: Secondary | ICD-10-CM | POA: Diagnosis not present

## 2023-10-10 DIAGNOSIS — Z6841 Body Mass Index (BMI) 40.0 and over, adult: Secondary | ICD-10-CM | POA: Diagnosis not present

## 2023-10-10 DIAGNOSIS — N1330 Unspecified hydronephrosis: Secondary | ICD-10-CM | POA: Diagnosis present

## 2023-10-10 DIAGNOSIS — A419 Sepsis, unspecified organism: Principal | ICD-10-CM | POA: Diagnosis present

## 2023-10-10 DIAGNOSIS — I10 Essential (primary) hypertension: Secondary | ICD-10-CM | POA: Diagnosis not present

## 2023-10-10 DIAGNOSIS — I252 Old myocardial infarction: Secondary | ICD-10-CM

## 2023-10-10 DIAGNOSIS — M4856XA Collapsed vertebra, not elsewhere classified, lumbar region, initial encounter for fracture: Secondary | ICD-10-CM | POA: Diagnosis present

## 2023-10-10 DIAGNOSIS — E669 Obesity, unspecified: Secondary | ICD-10-CM | POA: Diagnosis not present

## 2023-10-10 DIAGNOSIS — B955 Unspecified streptococcus as the cause of diseases classified elsewhere: Secondary | ICD-10-CM | POA: Diagnosis not present

## 2023-10-10 DIAGNOSIS — E871 Hypo-osmolality and hyponatremia: Secondary | ICD-10-CM | POA: Diagnosis not present

## 2023-10-10 DIAGNOSIS — E039 Hypothyroidism, unspecified: Secondary | ICD-10-CM | POA: Diagnosis present

## 2023-10-10 DIAGNOSIS — Z955 Presence of coronary angioplasty implant and graft: Secondary | ICD-10-CM

## 2023-10-10 DIAGNOSIS — E66813 Obesity, class 3: Secondary | ICD-10-CM | POA: Diagnosis present

## 2023-10-10 DIAGNOSIS — I25119 Atherosclerotic heart disease of native coronary artery with unspecified angina pectoris: Secondary | ICD-10-CM | POA: Diagnosis present

## 2023-10-10 DIAGNOSIS — K3189 Other diseases of stomach and duodenum: Secondary | ICD-10-CM | POA: Diagnosis not present

## 2023-10-10 DIAGNOSIS — R0602 Shortness of breath: Secondary | ICD-10-CM

## 2023-10-10 DIAGNOSIS — Z79899 Other long term (current) drug therapy: Secondary | ICD-10-CM

## 2023-10-10 DIAGNOSIS — E860 Dehydration: Secondary | ICD-10-CM | POA: Diagnosis present

## 2023-10-10 DIAGNOSIS — I5032 Chronic diastolic (congestive) heart failure: Secondary | ICD-10-CM | POA: Diagnosis present

## 2023-10-10 DIAGNOSIS — A429 Actinomycosis, unspecified: Secondary | ICD-10-CM

## 2023-10-10 DIAGNOSIS — Z800 Family history of malignant neoplasm of digestive organs: Secondary | ICD-10-CM

## 2023-10-10 DIAGNOSIS — C7800 Secondary malignant neoplasm of unspecified lung: Secondary | ICD-10-CM | POA: Diagnosis present

## 2023-10-10 DIAGNOSIS — E222 Syndrome of inappropriate secretion of antidiuretic hormone: Secondary | ICD-10-CM | POA: Diagnosis present

## 2023-10-10 DIAGNOSIS — D6481 Anemia due to antineoplastic chemotherapy: Secondary | ICD-10-CM | POA: Diagnosis present

## 2023-10-10 DIAGNOSIS — Z1152 Encounter for screening for COVID-19: Secondary | ICD-10-CM | POA: Diagnosis not present

## 2023-10-10 DIAGNOSIS — R197 Diarrhea, unspecified: Secondary | ICD-10-CM | POA: Diagnosis not present

## 2023-10-10 DIAGNOSIS — T451X5A Adverse effect of antineoplastic and immunosuppressive drugs, initial encounter: Secondary | ICD-10-CM | POA: Diagnosis present

## 2023-10-10 DIAGNOSIS — C786 Secondary malignant neoplasm of retroperitoneum and peritoneum: Secondary | ICD-10-CM | POA: Diagnosis present

## 2023-10-10 DIAGNOSIS — R6520 Severe sepsis without septic shock: Secondary | ICD-10-CM | POA: Diagnosis present

## 2023-10-10 DIAGNOSIS — K219 Gastro-esophageal reflux disease without esophagitis: Secondary | ICD-10-CM | POA: Diagnosis present

## 2023-10-10 DIAGNOSIS — I255 Ischemic cardiomyopathy: Secondary | ICD-10-CM | POA: Diagnosis present

## 2023-10-10 DIAGNOSIS — R7881 Bacteremia: Secondary | ICD-10-CM | POA: Diagnosis not present

## 2023-10-10 LAB — CULTURE, BLOOD (ROUTINE X 2)
Culture: NO GROWTH
Culture: NO GROWTH
Special Requests: ADEQUATE
Special Requests: ADEQUATE

## 2023-10-10 LAB — LACTIC ACID, PLASMA
Lactic Acid, Venous: 2.2 mmol/L (ref 0.5–1.9)
Lactic Acid, Venous: 2.5 mmol/L (ref 0.5–1.9)

## 2023-10-10 LAB — CBC
HCT: 36.7 % (ref 36.0–46.0)
Hemoglobin: 13.2 g/dL (ref 12.0–15.0)
MCH: 31.1 pg (ref 26.0–34.0)
MCHC: 36 g/dL (ref 30.0–36.0)
MCV: 86.6 fL (ref 80.0–100.0)
Platelets: 72 10*3/uL — ABNORMAL LOW (ref 150–400)
RBC: 4.24 MIL/uL (ref 3.87–5.11)
RDW: 14.5 % (ref 11.5–15.5)
WBC: 22.3 10*3/uL — ABNORMAL HIGH (ref 4.0–10.5)
nRBC: 0.2 % (ref 0.0–0.2)

## 2023-10-10 LAB — PROCALCITONIN: Procalcitonin: 2.36 ng/mL

## 2023-10-10 LAB — BASIC METABOLIC PANEL
Anion gap: 18 — ABNORMAL HIGH (ref 5–15)
BUN: 56 mg/dL — ABNORMAL HIGH (ref 6–20)
CO2: 18 mmol/L — ABNORMAL LOW (ref 22–32)
Calcium: 9.8 mg/dL (ref 8.9–10.3)
Chloride: 95 mmol/L — ABNORMAL LOW (ref 98–111)
Creatinine, Ser: 1.19 mg/dL — ABNORMAL HIGH (ref 0.44–1.00)
GFR, Estimated: 53 mL/min — ABNORMAL LOW (ref >60)
Glucose, Bld: 182 mg/dL — ABNORMAL HIGH (ref 70–99)
Potassium: 4 mmol/L (ref 3.5–5.1)
Sodium: 131 mmol/L — ABNORMAL LOW (ref 135–145)

## 2023-10-10 LAB — URINALYSIS, ROUTINE W REFLEX MICROSCOPIC
Bilirubin Urine: NEGATIVE
Glucose, UA: NEGATIVE mg/dL
Hgb urine dipstick: NEGATIVE
Ketones, ur: 5 mg/dL — AB
Leukocytes,Ua: NEGATIVE
Nitrite: NEGATIVE
Protein, ur: NEGATIVE mg/dL
Specific Gravity, Urine: >1.046 — ABNORMAL HIGH (ref 1.005–1.030)
pH: 5 (ref 5.0–8.0)

## 2023-10-10 LAB — RESPIRATORY PANEL BY PCR

## 2023-10-10 LAB — RESP PANEL BY RT-PCR (RSV, FLU A&B, COVID)  RVPGX2
Influenza A by PCR: NEGATIVE
Influenza B by PCR: NEGATIVE
Resp Syncytial Virus by PCR: NEGATIVE
SARS Coronavirus 2 by RT PCR: NEGATIVE

## 2023-10-10 LAB — BRAIN NATRIURETIC PEPTIDE: B Natriuretic Peptide: 308.7 pg/mL — ABNORMAL HIGH (ref 0.0–100.0)

## 2023-10-10 LAB — TROPONIN I (HIGH SENSITIVITY)
Troponin I (High Sensitivity): 26 ng/L — ABNORMAL HIGH (ref <18)
Troponin I (High Sensitivity): 25 ng/L — ABNORMAL HIGH (ref <18)

## 2023-10-10 LAB — CBG MONITORING, ED
Glucose-Capillary: 144 mg/dL — ABNORMAL HIGH (ref 70–99)
Glucose-Capillary: 149 mg/dL — ABNORMAL HIGH (ref 70–99)

## 2023-10-10 MED ORDER — SODIUM CHLORIDE 0.9 % IV SOLN
2.0000 g | Freq: Three times a day (TID) | INTRAVENOUS | Status: DC
  Administered 2023-10-10 – 2023-10-11 (×3): 2 g via INTRAVENOUS
  Filled 2023-10-10 (×3): qty 12.5

## 2023-10-10 MED ORDER — SODIUM CHLORIDE 0.9 % IV BOLUS
1000.0000 mL | Freq: Once | INTRAVENOUS | Status: AC
  Administered 2023-10-10: 1000 mL via INTRAVENOUS

## 2023-10-10 MED ORDER — IOHEXOL 350 MG/ML SOLN
100.0000 mL | Freq: Once | INTRAVENOUS | Status: AC | PRN
  Administered 2023-10-10: 100 mL via INTRAVENOUS

## 2023-10-10 MED ORDER — ONDANSETRON HCL 4 MG/2ML IJ SOLN: 4.0000 mg | Freq: Four times a day (QID) | INTRAMUSCULAR | Status: DC | PRN

## 2023-10-10 MED ORDER — VANCOMYCIN HCL IN DEXTROSE 1-5 GM/200ML-% IV SOLN
1000.0000 mg | Freq: Once | INTRAVENOUS | Status: AC
  Administered 2023-10-10: 1000 mg via INTRAVENOUS
  Filled 2023-10-10: qty 200

## 2023-10-10 MED ORDER — SODIUM CHLORIDE 0.9 % IV SOLN
2.0000 g | Freq: Once | INTRAVENOUS | Status: AC
  Administered 2023-10-10: 2 g via INTRAVENOUS
  Filled 2023-10-10: qty 12.5

## 2023-10-10 MED ORDER — INSULIN ASPART 100 UNIT/ML IJ SOLN
0.0000 [IU] | Freq: Three times a day (TID) | INTRAMUSCULAR | Status: DC
  Administered 2023-10-10: 1 [IU] via SUBCUTANEOUS
  Filled 2023-10-10 (×2): qty 1

## 2023-10-10 MED ORDER — IOHEXOL 300 MG/ML  SOLN: 100.0000 mL | Freq: Once | INTRAMUSCULAR | Status: DC | PRN

## 2023-10-10 MED ORDER — LACTATED RINGERS IV SOLN
150.0000 mL/h | INTRAVENOUS | Status: AC
  Administered 2023-10-10 – 2023-10-11 (×3): 150 mL/h via INTRAVENOUS

## 2023-10-10 MED ORDER — MORPHINE SULFATE (PF) 2 MG/ML IV SOLN
2.0000 mg | INTRAVENOUS | Status: DC | PRN
  Administered 2023-10-10: 2 mg via INTRAVENOUS
  Filled 2023-10-10: qty 1

## 2023-10-10 MED ORDER — ONDANSETRON HCL 4 MG PO TABS: 4.0000 mg | ORAL_TABLET | Freq: Four times a day (QID) | ORAL | Status: DC | PRN

## 2023-10-10 MED ORDER — ENOXAPARIN SODIUM 60 MG/0.6ML IJ SOSY: 0.5000 mg/kg | PREFILLED_SYRINGE | INTRAMUSCULAR | Status: DC

## 2023-10-10 MED ORDER — VANCOMYCIN HCL 1500 MG/300ML IV SOLN
1500.0000 mg | Freq: Once | INTRAVENOUS | Status: DC
  Filled 2023-10-10: qty 300

## 2023-10-10 MED ORDER — METRONIDAZOLE 500 MG/100ML IV SOLN
500.0000 mg | Freq: Two times a day (BID) | INTRAVENOUS | Status: DC
  Filled 2023-10-10: qty 100

## 2023-10-10 MED ORDER — VANCOMYCIN HCL 1250 MG/250ML IV SOLN: 1250.0000 mg | INTRAVENOUS | Status: DC

## 2023-10-10 NOTE — ED Notes (Signed)
 Patient transported to CT

## 2023-10-10 NOTE — ED Triage Notes (Signed)
 Pt c/o SOB and weakness x2 days.  Pt was recently discharged from hospital.  Pt reports she was recently diagnosed w/ C Diff but diarrhea has stopped.   Pt reports she was started on chemo a couple weeks ago.

## 2023-10-10 NOTE — Assessment & Plan Note (Signed)
 Plt count in 70s  Noted chemotherapy use in setting of endometrial cancer  Hold [facility] for now  Monitor

## 2023-10-10 NOTE — Assessment & Plan Note (Signed)
 Oral vancomycin

## 2023-10-10 NOTE — Assessment & Plan Note (Signed)
 Cont armour thyroid

## 2023-10-10 NOTE — ED Provider Triage Note (Signed)
 Emergency Medicine Provider Triage Evaluation Note  Tonya [name] , a 58 y.o. female  was evaluated in triage.  Pt complains of shortness of breath, recent sepsis, cancer patient, last chemo 2 weeks ago.  Review of Systems  Positive:  Negative:   Physical Exam  BP (!) 137/96 (BP Location: Left Arm)   Pulse (!) 126   Temp 98.1 F (36.7 C) (Oral)   Resp 20   Ht 5\' 4"  (1.626 m)   Wt 120.2 kg   LMP 07/22/2016   SpO2 99%   BMI 45.49 kg/m  Gen:   Awake, no distress   Resp:  Normal effort  MSK:   Moves extremities without difficulty  Other:    Medical Decision Making  Medically screening exam initiated at 9:53 AM.  Appropriate orders placed.  Tonya [name] was informed that the remainder of the evaluation will be completed by another provider, this initial triage assessment does not replace that evaluation, and the importance of remaining in the ED until their evaluation is complete.     Faythe Ghee, PA-C 10/10/23 [phone]

## 2023-10-10 NOTE — H&P (Addendum)
 History and Physical    Patient: Tonya [name] GEX:[id number] DOB: 06-09-1966 DOA: 10/10/2023 DOS: the patient was seen and examined on 10/10/2023 PCP: Danella Penton, MD  Patient coming from: Home  Chief Complaint:  Chief Complaint  Patient presents with   Shortness of Breath   Weakness   HPI: Tonya [name] is a 58 y.o. female with medical history significant of obesity, endometrial cancer, hypertension, SIADH, hypothyroidism, chronic HFpEF, recent diagnosis of C. difficile as well as strep bacteremia presenting with sepsis, pneumonia.  Patient noted to have been admitted February 9-February 13 for issues including sepsis related to C. difficile diarrhea.  Also with blood cultures positive for strep.  ID consulted with concern for gut translocation in setting of bacteremia.  Was given course of oral amoxicillin at discharge as well as oral vancomycin.  Patient reports resolution of diarrhea.  Does report worsening shortness of breath over the past 2 to 3 days.  Positive malaise.  No chest pain.  Worsening abdominal pain and distention.  Noted baseline endometrial cancer with noted metastatic disease on January CT abdomen pelvis.  Recently started on chemotherapy.  Followed by Dr. Smith Robert.  Mild orthopnea.  No focal hemiparesis or confusion. Presented to the ER afebrile, heart rate in the 100s, BP stable.  Satting well room air.  White count 22.3, hemoglobin 13.2, platelets 72, troponin 20s, procalcitonin 2.36, lactate 2.5.  COVID flu and RSV negative.  Creatinine 1.2.  Glucose 180. CXR w/ L sided PNA. Review of Systems: As mentioned in the history of present illness. All other systems reviewed and are negative. Past Medical History:  Diagnosis Date   Cancer (HCC)    Coronary artery disease    GERD (gastroesophageal reflux disease)    Hypertension    Hypothyroidism    Myocardial infarction (HCC)    Thyroid disease    Past Surgical History:  Procedure Laterality Date   CARDIAC CATHETERIZATION      CESAREAN SECTION     CORONARY STENT INTERVENTION N/A 07/24/2019   Procedure: CORONARY STENT INTERVENTION;  Surgeon: Yvonne Kendall, MD;  Location: [facility] INVASIVE CV LAB;  Service: Cardiovascular;  Laterality: N/A;  RCA & CFX   IR IMAGING GUIDED PORT INSERTION  09/23/2023   LEFT HEART CATH AND CORONARY ANGIOGRAPHY N/A 07/24/2019   Procedure: LEFT HEART CATH AND CORONARY ANGIOGRAPHY;  Surgeon: Lamar Blinks, MD;  Location: [facility] INVASIVE CV LAB;  Service: Cardiovascular;  Laterality: N/A;   TONSILLECTOMY     Social History:  reports that she has never smoked. She has never used smokeless tobacco. She reports that she does not currently use alcohol. She reports that she does not use drugs.  No Known Allergies  Family History  Problem Relation Age of Onset   Atrial fibrillation Mother        Passed January 2024.   Colon cancer Mother        Diagnosed 2007   Heart Problems Father    Heart attack Father    Heart disease Father     Prior to Admission medications   Medication Sig Start Date End Date Taking? Authorizing Provider  amoxicillin (AMOXIL) 500 MG capsule Take 1,000 mg by mouth 3 (three) times daily. 10/09/23  Yes [provider]  amoxicillin (AMOXIL) 500 MG tablet Take 2 tablets (1,000 mg total) by mouth 3 (three) times daily for 7 days. Start on 2/14 10/08/23 10/15/23 Yes Wouk, Wilfred Curtis, MD  ERGOCALCIFEROL PO Take 1 capsule by mouth every 7 (seven) days. 08/03/23  Yes [provider]  lidocaine-prilocaine (EMLA) cream Apply to affected area once 09/21/23  Yes Creig Hines, MD  losartan (COZAAR) 25 MG tablet Take 50 mg by mouth daily. 09/19/23  Yes [provider]  Multiple Vitamin (MULTI-VITAMIN) tablet Take 1 tablet by mouth daily.   Yes [provider]  ondansetron (ZOFRAN) 8 MG tablet Take 1 tablet (8 mg total) by mouth every 8 (eight) hours as needed for nausea, vomiting or refractory nausea / vomiting. Start on the third day after  carboplatin. 09/21/23  Yes Creig Hines, MD  oxyCODONE (OXY IR/ROXICODONE) 5 MG immediate release tablet Take 5 mg by mouth 3 (three) times daily as needed for moderate pain (pain score 4-6). 09/19/23 10/19/23 Yes [provider]  prochlorperazine (COMPAZINE) 10 MG tablet Take 1 tablet (10 mg total) by mouth every 6 (six) hours as needed for nausea or vomiting. 09/21/23  Yes Creig Hines, MD  sodium chloride 1 g tablet Take 1 tablet (1 g total) by mouth 2 (two) times daily with a meal. 10/06/23  Yes Wouk, Wilfred Curtis, MD  thyroid (ARMOUR) 120 MG tablet Take 60 mg by mouth daily before breakfast.   Yes [provider]  vancomycin (VANCOCIN) 125 MG capsule Take 1 capsule (125 mg total) by mouth every 6 (six) hours for 10 days. 10/06/23 10/16/23 Yes Wouk, Wilfred Curtis, MD  dexamethasone (DECADRON) 4 MG tablet Take 2 tabs by mouth starting day after last dose of etoposide for one day only. Repeat every 21 days. Take with food. Patient not taking: Reported on 10/10/2023 09/21/23   Creig Hines, MD  hyoscyamine (LEVSIN SL) 0.125 MG SL tablet Place 0.125 mg under the tongue every 6 (six) hours as needed for cramping. Patient not taking: Reported on 10/10/2023 08/26/23   [provider]  Vitamin D, Ergocalciferol, (DRISDOL) 1.25 MG (50000 UNIT) CAPS capsule Take 50,000 Units by mouth once a week. 08/03/23   [provider]    Physical Exam: Vitals:   10/10/23 0947 10/10/23 0948 10/10/23 1236 10/10/23 1347  BP: (!) 137/96  100/80   Pulse: (!) 126  (!) 115   Resp: 20  17   Temp: 98.1 F (36.7 C)   (!) 97.5 F (36.4 C)  TempSrc: Oral   Oral  SpO2: 99%  94%   Weight:  120.2 kg    Height:  5\' 4"  (1.626 m)     Physical Exam Constitutional:      Appearance: She is obese.  HENT:     Head: Normocephalic and atraumatic.     Mouth/Throat:     Mouth: Mucous membranes are moist.  Eyes:     Pupils: Pupils are equal, round, and reactive to light.  Cardiovascular:     Rate  and Rhythm: Normal rate and regular rhythm.  Pulmonary:     Effort: Pulmonary effort is normal.  Abdominal:     General: Bowel sounds are normal.  Musculoskeletal:        General: Normal range of motion.  Skin:    General: Skin is warm.  Neurological:     General: No focal deficit present.  Psychiatric:        Mood and Affect: Mood normal.     Data Reviewed:  There are no new results to review at this time.  DG Chest 2 View CLINICAL DATA:  Shortness of breath  EXAM: CHEST - 2 VIEW  COMPARISON:  Chest radiograph dated 10/02/2023, CT abdomen and pelvis dated  08/29/2023  FINDINGS: Lines/tubes: Right chest wall port tip projects over the superior cavoatrial junction.  Lungs: Well inflated lungs. Patchy opacity projecting over the left costophrenic angle.  Pleura: No pneumothorax or pleural effusion.  Heart/mediastinum: The heart size and mediastinal contours are within normal limits.  Bones: Compression deformity of approximately L1, new from 08/29/2023.  IMPRESSION: 1. Patchy opacity projecting over the left costophrenic angle, which may represent atelectasis or pneumonia. 2. New approximately L1 compression deformity.  Electronically Signed   By: Agustin Cree M.D.   On: 10/10/2023 15:45  Lab Results  Component Value Date   WBC 22.3 (H) 10/10/2023   HGB 13.2 10/10/2023   HCT 36.7 10/10/2023   MCV 86.6 10/10/2023   PLT 72 (L) 10/10/2023   Last metabolic panel Lab Results  Component Value Date   GLUCOSE 182 (H) 10/10/2023   NA 131 (L) 10/10/2023   K 4.0 10/10/2023   CL 95 (L) 10/10/2023   CO2 18 (L) 10/10/2023   BUN 56 (H) 10/10/2023   CREATININE 1.19 (H) 10/10/2023   GFRNONAA 53 (L) 10/10/2023   CALCIUM 9.8 10/10/2023   PHOS 1.9 (L) 10/05/2023   PROT 6.7 10/02/2023   ALBUMIN 3.0 (L) 10/02/2023   BILITOT 1.5 (H) 10/02/2023   ALKPHOS 106 10/02/2023   AST 43 (H) 10/02/2023   ALT 24 10/02/2023   ANIONGAP 18 (H) 10/10/2023    Assessment and  Plan: Severe sepsis (HCC) PNA  Meeting sepsis criteria based on HR 100s, WBC 22.3  Noted worsening SOB and cough at home over multiple days  No hypoxia  + L sided PNA on CXR  Given worsening SOB with tachycardia over multiple days with active endometrial/cervical cancer, will also check CTA chest to rule out PE  Noted recent admission for  recent admission for sepsis assd w/ c diff  IV cefepime and vancomycin for respiratory coverage Blood and resp coverage   C. difficile colitis Recently admitted 2/9-2/13 for c diff colitis  No active diarrhea  On oral vancomycin  Will continue (stop date 10/16/23)    Endometrial carcinoma (HCC) Baseline endometrial cancer  Recently started on chemotherapy  Followed by Dr. Smith Robert  Noted widespread metastatic disease on 08/2023 CT  + worsening abd pain and abdominal distension  Getting repeat CT A&P to assess for extension of metastatic disease in discussion with Dr. Smith Robert  Follow   Thrombocytopenia (HCC) Plt count in 70s  Noted chemotherapy use in setting of endometrial cancer  Hold AC for now  Monitor   Bacteremia due to Streptococcus blood culture positive for strep, ID consulted, likely gut translocation. Treated with ceftriaxone, transitioned to amoxicillin at discharge  Case discussed with Dr. 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 Na 131 today Cont salt tabs  Follow   Coronary artery disease involving native coronary artery of native heart with angina pectoris (HCC) Baseline CAD  Trop 20s  No active CP  Monitor   Diabetes mellitus (HCC) Blood sugar 180s  SSI  Monitor   Essential hypertension BP stable  Titrate home regimen    Ischemic cardiomyopathy 2D ECHO 07/2019 w/ EF 45-50%  Appears euvolemic  Monitor w/ hydration   Hypothyroidism (acquired) Cont armour thyroid       Advance Care Planning:   Code Status: Full  Code   Consults: None   Family Communication: No family at the bedside   Severity of Illness: The appropriate patient  status for this patient is INPATIENT. Inpatient status is judged to be reasonable and necessary in order to provide the required intensity of service to ensure the patient's safety. The patient's presenting symptoms, physical exam findings, and initial radiographic and laboratory data in the context of their chronic comorbidities is felt to place them at high risk for further clinical deterioration. Furthermore, it is not anticipated that the patient will be medically stable for discharge from the hospital within 2 midnights of admission.   * I certify that at the point of admission it is my clinical judgment that the patient will require inpatient hospital care spanning beyond 2 midnights from the point of admission due to high intensity of service, high risk for further deterioration and high frequency of surveillance required.*  Author: Floydene Flock, MD 10/10/2023 5:24 PM  For on call review [email or web address].

## 2023-10-10 NOTE — Assessment & Plan Note (Addendum)
 Present on admission with tachycardia and leukocytosis.  Recent positive blood cultures with Streptococcus viridans and actinomyces.  C. difficile infection present on last admission.  Possibility of pneumonia.  Lactic acidosis. Patient on oral vancomycin for C. difficile and placed on Maxipime.  Will get ID consultation for help with antibiotics.

## 2023-10-10 NOTE — ED Notes (Signed)
Patient given a dinner tray.

## 2023-10-10 NOTE — ED Notes (Signed)
 Patient up to bedside commode with 1 assist. Urine sample obtained. Mucousy stool noted. Patient with excoriation to coccyx area. Cleansed and barrier cream applied.

## 2023-10-10 NOTE — ED Notes (Signed)
 This RN called CT to let them know that pt is ready to have scans done.

## 2023-10-10 NOTE — ED Notes (Signed)
 Patient used bedpan @1600 , unable to chart amount she voided.

## 2023-10-10 NOTE — Progress Notes (Signed)
 PHARMACIST - PHYSICIAN COMMUNICATION  CONCERNING:  Enoxaparin (Lovenox) for DVT Prophylaxis   RECOMMENDATION: Patient was prescribed enoxaparin 40mg  q24 hours for VTE prophylaxis.   Filed Weights   10/10/23 0948  Weight: 120.2 kg (265 lb)    Body mass index is 45.49 kg/m.  Estimated Creatinine Clearance: 65 mL/min (A) (by C-G formula based on SCr of 1.19 mg/dL (H)).  Based on [facility] policy patient is candidate for enoxaparin 0.5mg /kg TBW SQ every 24 hours based on BMI being >30.  DESCRIPTION: Pharmacy has adjusted enoxaparin dose per [facility] policy.  Patient is now receiving enoxaparin 60 mg every 24 hours   Tressie Ellis 10/10/2023 3:55 PM

## 2023-10-10 NOTE — ED Notes (Addendum)
 Blood cultures x1 sent to the lab.

## 2023-10-10 NOTE — Assessment & Plan Note (Signed)
 Sliding scale ordered.

## 2023-10-10 NOTE — Telephone Encounter (Signed)
 Pt. Called and she sent to ER for diarrhea and weakness and could not  walk and she is in hospital very weak per pt.

## 2023-10-10 NOTE — Consult Note (Addendum)
 Pharmacy Antibiotic Note  Tonya [name] is a 58 y.o. female admitted on 10/10/2023 with sepsis with unknown source.  Pharmacy has been consulted for cefepime and vancomycin dosing. Pt was discharged on amox 1000 mg TID. Afeb, WBC 22.3. Pt did have neutropenia last admission due to chemo. Scr slightly elevated.   2/9 Bcx 1 out of 4 grew:   VIRIDANS STREPTOCOCCUS  ACTINOMYCES NAESLUNDII   Plan: Start cefepime 2 g q8H   Pt received vancomycin 1000 mg x 1. Will give vancomycin 1500 mg x 1 for a total loading dose of 2500 mg. Will start maintenance dose of vancomycin 1250 mg q24H. Predicted AUC of 508. Goal 400-600. Vd 0.5, Scr 1.19, IBW. Plan to obtain vancomycin level after 4th or 5th dose.   F/u with Scr in the AM.     Height: 5\' 4"  (162.6 cm) Weight: 120.2 kg (265 lb) IBW/kg (Calculated) : 54.7  Temp (24hrs), Avg:97.8 F (36.6 C), Min:97.5 F (36.4 C), Max:98.1 F (36.7 C)  Recent Labs  Lab 10/04/23 0542 10/05/23 0545 10/06/23 0428 10/10/23 1004 10/10/23 1236  WBC 0.5* 0.2* 0.2* 22.3*  --   CREATININE 0.74 0.68 0.56 1.19*  --   LATICACIDVEN  --   --   --  2.2* 2.5*    Estimated Creatinine Clearance: 65 mL/min (A) (by C-G formula based on SCr of 1.19 mg/dL (H)).    No Known Allergies  Antimicrobials this admission: 2/17 cefepime >>  2/17 vancomycin >>   Dose adjustments this admission: None  Microbiology results: 2/12 BCx: NGTD.    Thank you for allowing pharmacy to be a part of this patient's care.  Ronnald Ramp, PharmD, BCPS 10/10/2023 3:17 PM

## 2023-10-10 NOTE — Assessment & Plan Note (Signed)
 2D ECHO 07/2019 w/ EF 45-50%  Appears euvolemic  Monitor w/ hydration

## 2023-10-10 NOTE — Assessment & Plan Note (Addendum)
Sodium 134

## 2023-10-10 NOTE — Assessment & Plan Note (Signed)
 BP stable  Titrate home regimen

## 2023-10-10 NOTE — Assessment & Plan Note (Addendum)
 Low-dose metoprolol added

## 2023-10-10 NOTE — Assessment & Plan Note (Addendum)
 CT scan showing omental implants.  Dr. Smith Robert from oncology to see.  With abdominal distention General Surgery started on clear liquid diet.

## 2023-10-10 NOTE — ED Notes (Signed)
 Patient transported to X-ray

## 2023-10-10 NOTE — Assessment & Plan Note (Addendum)
 blood culture positive for strep, ID consulted, likely gut translocation. Treated with ceftriaxone, transitioned to amoxicillin at discharge  Case discussed with Dr. Rivka Safer  Holding amoxicilllin while on cefepime  Monitor

## 2023-10-10 NOTE — Progress Notes (Signed)
       CROSS COVER NOTE  NAME: Tonya [name] MRN: [id number] DOB : 09-06-1965    Date of Service   10/10/2023   HPI/Events of Note   Dr. Alvester Morin requested a surgical consult based on CT results.  Interventions   - Spoke with Dr. Maurine Minister of general surgery and they recommend making pt NPO and continue current antibiotics. No acute intervention at this time but they will evaluate pt in the AM. - NPO order placed.     Nidia Grogan Lamin Geradine Girt, MSN, APRN, AGACNP-BC [facility] [place] Pager: [phone]. Check Amion for Availability

## 2023-10-10 NOTE — ED Provider Notes (Signed)
 [facility] Provider Note    Event Date/Time   First MD Initiated Contact with Patient 10/10/23 1221     (approximate)   History   Shortness of Breath and Weakness   HPI  Tonya [name] is a 58 y.o. female with a history of endometrial/cervical cancer, CAD, GERD, hypertension, hypothyroidism, and type 2 diabetes who presents with worsening shortness of breath and generalized weakness over the last couple of days.  The patient was just discharged from the hospital last week.  She states that she had diarrhea at that time which has now resolved.  She denies any productive cough.  She feels short of breath with minimal exertion although it is improved at rest.  She has no chest pain.  She reports persistent decreased appetite and difficulty swallowing solid foods.  She has been drinking Ensure but is still feeling weak and thinks she is malnourished and dehydrated.  I reviewed the past medical records.  The patient was just admitted to the hospitalist service from 2/9 through 2/13 with sepsis and C. difficile colitis.  She was discharged with a course of oral vancomycin as well as amoxicillin for a positive strep blood culture.   Physical Exam   Triage Vital Signs: ED Triage Vitals  Encounter Vitals Group     BP 10/10/23 0947 (!) 137/96     Systolic BP Percentile --      Diastolic BP Percentile --      Pulse Rate 10/10/23 0947 (!) 126     Resp 10/10/23 0947 20     Temp 10/10/23 0947 98.1 F (36.7 C)     Temp Source 10/10/23 0947 Oral     SpO2 10/10/23 0946 99 %     Weight 10/10/23 0948 265 lb (120.2 kg)     Height 10/10/23 0948 5\' 4"  (1.626 m)     Head Circumference --      Peak Flow --      Pain Score 10/10/23 0947 0     Pain Loc --      Pain Education --      Exclude from Growth Chart --     Most recent vital signs: Vitals:   10/10/23 1236 10/10/23 1347  BP: 100/80   Pulse: (!) 115   Resp: 17   Temp:  (!) 97.5 F (36.4 C)  SpO2: 94%       General: Alert, weak appearing, no distress.  CV:  Good peripheral perfusion.  Resp:  Normal effort.  Somewhat diminished breath sounds with no wheezes or rales. Abd:  Soft and nontender.  No distention.  Other:  2+ bilateral lower extremity edema.   ED Results / Procedures / Treatments   Labs (all labs ordered are listed, but only abnormal results are displayed) Labs Reviewed  BASIC METABOLIC PANEL - Abnormal; Notable for the following components:      Result Value   Sodium 131 (*)    Chloride 95 (*)    CO2 18 (*)    Glucose, Bld 182 (*)    BUN 56 (*)    Creatinine, Ser 1.19 (*)    GFR, Estimated 53 (*)    Anion gap 18 (*)    All other components within normal limits  CBC - Abnormal; Notable for the following components:   WBC 22.3 (*)    Platelets 72 (*)    All other components within normal limits  LACTIC ACID, PLASMA - Abnormal; Notable for the following components:  Lactic Acid, Venous 2.2 (*)    All other components within normal limits  LACTIC ACID, PLASMA - Abnormal; Notable for the following components:   Lactic Acid, Venous 2.5 (*)    All other components within normal limits  BRAIN NATRIURETIC PEPTIDE - Abnormal; Notable for the following components:   B Natriuretic Peptide 308.7 (*)    All other components within normal limits  TROPONIN I (HIGH SENSITIVITY) - Abnormal; Notable for the following components:   Troponin I (High Sensitivity) 26 (*)    All other components within normal limits  RESP PANEL BY RT-PCR (RSV, FLU A&B, COVID)  [id number]  PROCALCITONIN  URINALYSIS, ROUTINE W REFLEX MICROSCOPIC  TROPONIN I (HIGH SENSITIVITY)     EKG  ED ECG REPORT I, Dionne Bucy, the attending physician, personally viewed and interpreted this ECG.  Date: 10/10/2023 EKG Time: 09 50 Rate: 128 Rhythm: Sinus tachycardia QRS Axis: normal Intervals: LPFB ST/T Wave abnormalities: normal Narrative Interpretation: no evidence of acute  ischemia    RADIOLOGY  Chest x-ray: I independently viewed and interpreted the images; there is no focal consolidation or edema.  Radiology read is pending.   PROCEDURES:  Critical Care performed: Yes, see critical care procedure note(s)  .Critical Care  Performed by: Dionne Bucy, MD Authorized by: Dionne Bucy, MD   Critical care provider statement:    Critical care time (minutes):  30   Critical care time was exclusive of:  Separately billable procedures and treating other patients   Critical care was necessary to treat or prevent imminent or life-threatening deterioration of the following conditions:  Sepsis   Critical care was time spent personally by me on the following activities:  Development of treatment plan with patient or surrogate, discussions with consultants, evaluation of patient's response to treatment, examination of patient, ordering and review of laboratory studies, ordering and review of radiographic studies, ordering and performing treatments and interventions, pulse oximetry, re-evaluation of patient's condition, review of old charts and obtaining history from patient or surrogate   Care discussed with: admitting provider      MEDICATIONS ORDERED IN ED: Medications  vancomycin (VANCOCIN) IVPB 1000 mg/200 mL premix (1,000 mg Intravenous New Bag/Given 10/10/23 1512)  iohexol (OMNIPAQUE) 300 MG/ML solution 100 mL (has no administration in time range)  lactated ringers infusion (has no administration in time range)  metroNIDAZOLE (FLAGYL) IVPB 500 mg (has no administration in time range)  sodium chloride 0.9 % bolus 1,000 mL (0 mLs Intravenous Stopped 10/10/23 1514)  ceFEPIme (MAXIPIME) 2 g in sodium chloride 0.9 % 100 mL IVPB (0 g Intravenous Stopped 10/10/23 1514)     IMPRESSION / MDM / ASSESSMENT AND PLAN / ED COURSE  I reviewed the triage vital signs and the nursing notes.  58 year old female with PMH as noted above, currently undergoing  treatment for cancer and just recently admitted for sepsis and C. difficile presents with worsening shortness of breath and generalized weakness over the last couple of days.  On exam the patient is tachycardic, afebrile, with a borderline low blood pressure.  She is not in any respiratory distress.  Breath sounds are unremarkable.  She does have 2+ bilateral lower extremity edema which she states is new.  Differential diagnosis includes, but is not limited to, HCAP, viral syndrome, other acute infection/sepsis, chemotherapy side effects, new onset CHF, other cardiac etiology.  Patient's presentation is most consistent with acute presentation with potential threat to life or bodily function.  The patient is on the cardiac monitor to  evaluate for evidence of arrhythmia and/or significant heart rate changes.  Initial lactate is elevated.  WBC count is significant elevated.  Given this and the vital signs, the patient meets sepsis criteria.  I have ordered empiric antibiotics to cover for HCAP, as well as a liter of fluids.  The patient is currently not in septic shock.  We will obtain additional lab workup, chest x-ray, and reassess.  I also consulted and discussed the case with Dr. Smith Robert from oncology who is following the patient.  She states that the patient was supposed to get a PET/CT but was unable to do so so recommends getting a CT chest/abdomen/pelvis for further evaluation.  ----------------------------------------- 3:14 PM on 10/10/2023 -----------------------------------------  Chest x-ray shows nonspecific findings.  There is no clear consolidation.  Respiratory panel is negative.  Troponin is minimally elevated.  Procalcitonin is elevated which increases my suspicion for bacterial infection.  CT is still pending.  I consulted Dr. Alvester Morin from the hospitalist service; based on our discussion he agrees to evaluate the patient for admission.   FINAL CLINICAL IMPRESSION(S) / ED DIAGNOSES    Final diagnoses:  Sepsis, due to unspecified organism, unspecified whether acute organ dysfunction present (HCC)  Shortness of breath     Rx / DC Orders   ED Discharge Orders     None        Note:  This document was prepared using Dragon voice recognition software and may include unintentional dictation errors.    Dionne Bucy, MD 10/10/23 1515

## 2023-10-10 NOTE — ED Notes (Signed)
 MD Siadecki informed of lactic of 2.2

## 2023-10-11 ENCOUNTER — Other Ambulatory Visit: Payer: Self-pay | Admitting: *Deleted

## 2023-10-11 ENCOUNTER — Encounter: Payer: Self-pay | Admitting: Family Medicine

## 2023-10-11 ENCOUNTER — Telehealth: Payer: Self-pay | Admitting: *Deleted

## 2023-10-11 DIAGNOSIS — R197 Diarrhea, unspecified: Secondary | ICD-10-CM

## 2023-10-11 DIAGNOSIS — A419 Sepsis, unspecified organism: Secondary | ICD-10-CM | POA: Diagnosis not present

## 2023-10-11 DIAGNOSIS — D696 Thrombocytopenia, unspecified: Secondary | ICD-10-CM | POA: Diagnosis not present

## 2023-10-11 DIAGNOSIS — R6520 Severe sepsis without septic shock: Secondary | ICD-10-CM

## 2023-10-11 DIAGNOSIS — I10 Essential (primary) hypertension: Secondary | ICD-10-CM

## 2023-10-11 DIAGNOSIS — I25119 Atherosclerotic heart disease of native coronary artery with unspecified angina pectoris: Secondary | ICD-10-CM

## 2023-10-11 DIAGNOSIS — R7881 Bacteremia: Secondary | ICD-10-CM

## 2023-10-11 DIAGNOSIS — E871 Hypo-osmolality and hyponatremia: Secondary | ICD-10-CM

## 2023-10-11 DIAGNOSIS — R0600 Dyspnea, unspecified: Secondary | ICD-10-CM

## 2023-10-11 DIAGNOSIS — R5383 Other fatigue: Secondary | ICD-10-CM

## 2023-10-11 DIAGNOSIS — E039 Hypothyroidism, unspecified: Secondary | ICD-10-CM | POA: Diagnosis not present

## 2023-10-11 DIAGNOSIS — S32010A Wedge compression fracture of first lumbar vertebra, initial encounter for closed fracture: Secondary | ICD-10-CM | POA: Insufficient documentation

## 2023-10-11 DIAGNOSIS — A429 Actinomycosis, unspecified: Secondary | ICD-10-CM

## 2023-10-11 DIAGNOSIS — I255 Ischemic cardiomyopathy: Secondary | ICD-10-CM

## 2023-10-11 DIAGNOSIS — C541 Malignant neoplasm of endometrium: Secondary | ICD-10-CM

## 2023-10-11 DIAGNOSIS — D61818 Other pancytopenia: Secondary | ICD-10-CM | POA: Diagnosis not present

## 2023-10-11 DIAGNOSIS — R188 Other ascites: Secondary | ICD-10-CM | POA: Diagnosis not present

## 2023-10-11 DIAGNOSIS — A0472 Enterocolitis due to Clostridium difficile, not specified as recurrent: Secondary | ICD-10-CM | POA: Diagnosis not present

## 2023-10-11 DIAGNOSIS — K3189 Other diseases of stomach and duodenum: Secondary | ICD-10-CM | POA: Diagnosis not present

## 2023-10-11 LAB — CBC
HCT: 32.8 % — ABNORMAL LOW (ref 36.0–46.0)
Hemoglobin: 11.5 g/dL — ABNORMAL LOW (ref 12.0–15.0)
MCH: 30.6 pg (ref 26.0–34.0)
MCHC: 35.1 g/dL (ref 30.0–36.0)
MCV: 87.2 fL (ref 80.0–100.0)
Platelets: 76 10*3/uL — ABNORMAL LOW (ref 150–400)
RBC: 3.76 MIL/uL — ABNORMAL LOW (ref 3.87–5.11)
RDW: 14.6 % (ref 11.5–15.5)
WBC: 24.4 10*3/uL — ABNORMAL HIGH (ref 4.0–10.5)
nRBC: 0.3 % — ABNORMAL HIGH (ref 0.0–0.2)

## 2023-10-11 LAB — CBG MONITORING, ED
Glucose-Capillary: 116 mg/dL — ABNORMAL HIGH (ref 70–99)
Glucose-Capillary: 112 mg/dL — ABNORMAL HIGH (ref 70–99)

## 2023-10-11 LAB — BASIC METABOLIC PANEL
Anion gap: 11 (ref 5–15)
BUN: 52 mg/dL — ABNORMAL HIGH (ref 6–20)
CO2: 23 mmol/L (ref 22–32)
Calcium: 9.4 mg/dL (ref 8.9–10.3)
Chloride: 100 mmol/L (ref 98–111)
Creatinine, Ser: 1.23 mg/dL — ABNORMAL HIGH (ref 0.44–1.00)
GFR, Estimated: 51 mL/min — ABNORMAL LOW (ref >60)
Glucose, Bld: 129 mg/dL — ABNORMAL HIGH (ref 70–99)
Potassium: 4.3 mmol/L (ref 3.5–5.1)
Sodium: 134 mmol/L — ABNORMAL LOW (ref 135–145)

## 2023-10-11 LAB — HIV ANTIBODY (ROUTINE TESTING W REFLEX): HIV Screen 4th Generation wRfx: NONREACTIVE

## 2023-10-11 LAB — STREP PNEUMONIAE URINARY ANTIGEN: Strep Pneumo Urinary Antigen: NEGATIVE

## 2023-10-11 LAB — GLUCOSE, CAPILLARY
Glucose-Capillary: 102 mg/dL — ABNORMAL HIGH (ref 70–99)
Glucose-Capillary: 93 mg/dL (ref 70–99)

## 2023-10-11 MED ORDER — SODIUM CHLORIDE 0.9 % IV SOLN
2.0000 g | INTRAVENOUS | Status: DC
  Administered 2023-10-11 – 2023-10-13 (×3): 2 g via INTRAVENOUS
  Filled 2023-10-11 (×4): qty 20

## 2023-10-11 MED ORDER — VANCOMYCIN HCL 125 MG PO CAPS: 125.0000 mg | ORAL_CAPSULE | Freq: Four times a day (QID) | ORAL | Status: DC

## 2023-10-11 MED ORDER — OXYCODONE HCL 5 MG PO TABS
5.0000 mg | ORAL_TABLET | Freq: Three times a day (TID) | ORAL | Status: DC | PRN
  Administered 2023-10-13: 5 mg via ORAL
  Filled 2023-10-11: qty 1

## 2023-10-11 MED ORDER — METOPROLOL TARTRATE 25 MG PO TABS
12.5000 mg | ORAL_TABLET | Freq: Two times a day (BID) | ORAL | Status: DC
  Administered 2023-10-11 – 2023-10-14 (×7): 12.5 mg via ORAL
  Filled 2023-10-11 (×7): qty 1

## 2023-10-11 MED ORDER — VANCOMYCIN HCL 125 MG PO CAPS
125.0000 mg | ORAL_CAPSULE | Freq: Four times a day (QID) | ORAL | Status: DC
  Administered 2023-10-11 (×2): 125 mg via ORAL
  Filled 2023-10-11 (×3): qty 1

## 2023-10-11 MED ORDER — THYROID 60 MG PO TABS
60.0000 mg | ORAL_TABLET | Freq: Every day | ORAL | Status: DC
  Administered 2023-10-11 – 2023-10-14 (×4): 60 mg via ORAL
  Filled 2023-10-11 (×4): qty 1

## 2023-10-11 NOTE — Telephone Encounter (Signed)
 She is in the hospital and she needs wheelchair because she weak and has issues to go down steps at home. She has to come here to get her cancer treatments, she also needs ramp, and transportation . I will send message to [facility] team about wheel chair and send a social work for the other.Smith Robert

## 2023-10-11 NOTE — Telephone Encounter (Signed)
 Message sent to ED SW - Cyrus regarding patient's requests.

## 2023-10-11 NOTE — Progress Notes (Signed)
 PT Cancellation Note  Patient Details Name: Tonya [name] MRN: [id number] DOB: [birth year]   Cancelled Treatment:    Reason Eval/Treat Not Completed: Other (comment) Chart reviewed, entered pt room as OT was finishing up with eval.  Pt reports fatigue after the effort and not feeling up to doing more activity at this time. Will maintain on caseload and attempt to see tomorrow as appropriate.  Malachi Pro, DPT 10/11/2023, 4:50 PM

## 2023-10-11 NOTE — Consult Note (Signed)
 [facility] SURGICAL CONSULTATION NOTE (initial) - cpt: 62130   HISTORY OF PRESENT ILLNESS (HPI):  58 y.o. female presented to [facility] ED yesterday for evaluation of weakness and SOB. Patient recently admitted to [facility] from 02/09-02/13 for C diff colitis. She was discharged home on oral vancomycin as well as amoxicillin for a positive strep blood culture. She reports since being home, she has felt progressively weaker and SOB with even short activity such as walking to the restroom. She is also reporting abdominal distension as well. She is able to pass flatus and has had bowel movements. 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 She had initial endometrial and cervical biopsy with Dr Johnnette Litter (although I can not find record of this surgery) which revealed abundant necrosis concerning for malignancy but not definitive. She would undergo follow up FNA of retroperitoneal lymph node which revealed poorly differentiated carcinoma with neuroendocrine features. She has been following with Dr Smith Robert. She underwent her first round of chemotherapy on 02/03. Previous abdominal surgery positive for C-section. Work up in the ED revealed leukocytosis to 22.3K (now 24.4K), Hgb to 13.2, sCr -1.19 (now 1.23), venous lactic acid to 2.2, BNP 308. CXR was concerning for PNA. CTA was obtained and ruled out PE. She did also get CT Abdomen/Pelvis which shows some dilation of small bowel and colon with ascites. She was admitted to the medicine service for presumed sepsis from PNA. She is on Cefepime and Vancomycin.   Surgery is consulted by hospitalist provider Larkin Ina, NP in this context for evaluation and management of small bowel changes on CT.  PAST MEDICAL HISTORY (PMH):  Past Medical History:  Diagnosis Date   Cancer (HCC)    Coronary artery disease    GERD (gastroesophageal reflux disease)    Hypertension     Hypothyroidism    Myocardial infarction (HCC)    Thyroid disease      PAST SURGICAL HISTORY (PSH):  Past Surgical History:  Procedure Laterality Date   CARDIAC CATHETERIZATION     CESAREAN SECTION     CORONARY STENT INTERVENTION N/A 07/24/2019   Procedure: CORONARY STENT INTERVENTION;  Surgeon: Yvonne Kendall, MD;  Location: [facility] INVASIVE CV LAB;  Service: Cardiovascular;  Laterality: N/A;  RCA & CFX   IR IMAGING GUIDED PORT INSERTION  09/23/2023   LEFT HEART CATH AND CORONARY ANGIOGRAPHY N/A 07/24/2019   Procedure: LEFT HEART CATH AND CORONARY ANGIOGRAPHY;  Surgeon: Lamar Blinks, MD;  Location: [facility] INVASIVE CV LAB;  Service: Cardiovascular;  Laterality: N/A;   TONSILLECTOMY       MEDICATIONS:  Prior to Admission medications   Medication Sig Start Date End Date Taking? Authorizing Provider  amoxicillin (AMOXIL) 500 MG capsule Take 1,000 mg by mouth 3 (three) times daily. 10/09/23  Yes [provider]  amoxicillin (AMOXIL) 500 MG tablet Take 2 tablets (1,000 mg total) by mouth 3 (three) times daily for 7 days. Start on 2/14 10/08/23 10/15/23 Yes Wouk, Wilfred Curtis, MD  ERGOCALCIFEROL PO Take 1 capsule by mouth every 7 (seven) days. 08/03/23  Yes [provider]  lidocaine-prilocaine (EMLA) cream Apply to affected area once 09/21/23  Yes Creig Hines, MD  losartan (COZAAR) 25 MG tablet Take 50 mg by mouth daily. 09/19/23  Yes [provider]  Multiple Vitamin (MULTI-VITAMIN) tablet Take 1 tablet by mouth daily.   Yes [provider]  ondansetron (ZOFRAN) 8 MG tablet Take 1 tablet (8 mg total) by mouth every  8 (eight) hours as needed for nausea, vomiting or refractory nausea / vomiting. Start on the third day after carboplatin. 09/21/23  Yes Creig Hines, MD  oxyCODONE (OXY IR/ROXICODONE) 5 MG immediate release tablet Take 5 mg by mouth 3 (three) times daily as needed for moderate pain (pain score 4-6). 09/19/23 10/19/23 Yes [provider]   prochlorperazine (COMPAZINE) 10 MG tablet Take 1 tablet (10 mg total) by mouth every 6 (six) hours as needed for nausea or vomiting. 09/21/23  Yes Creig Hines, MD  sodium chloride 1 g tablet Take 1 tablet (1 g total) by mouth 2 (two) times daily with a meal. 10/06/23  Yes Wouk, Wilfred Curtis, MD  thyroid (ARMOUR) 120 MG tablet Take 60 mg by mouth daily before breakfast.   Yes [provider]  vancomycin (VANCOCIN) 125 MG capsule Take 1 capsule (125 mg total) by mouth every 6 (six) hours for 10 days. 10/06/23 10/16/23 Yes Wouk, Wilfred Curtis, MD  Vitamin D, Ergocalciferol, (DRISDOL) 1.25 MG (50000 UNIT) CAPS capsule Take 50,000 Units by mouth once a week. 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[employer])  Tobacco Use   Smoking status: Never   Smokeless tobacco: Never  Vaping Use   Vaping status: Never Used  Substance and Sexual Activity   Alcohol use: Not Currently    Comment: occ   Drug use: Never   Sexual activity: Not Currently  Other Topics Concern   Not on file  Social History Narrative   Not on file   Social Drivers of Health   Financial Resource Strain: Low Risk  (07/23/2019)   Overall Financial Resource Strain (CARDIA)    Difficulty of Paying Living Expenses: Not hard at all  Food Insecurity: No Food Insecurity (10/03/2023)   Hunger Vital Sign    Worried About Running Out of Food in the Last Year: Never true    Ran Out of Food in the Last Year: Never true  Transportation Needs: No Transportation Needs (10/03/2023)   PRAPARE - Administrator, Civil Service (Medical): No    Lack of Transportation (Non-Medical): No  Physical Activity: Unknown (07/23/2019)    Exercise Vital Sign    Days of Exercise per Week: 2 days    Minutes of Exercise per Session: Not on file  Stress: No Stress Concern Present (07/23/2019)   [employer] of Occupational Health - Occupational Stress Questionnaire    Feeling of Stress : Not at all  Social Connections: Not on file  Intimate Partner Violence: Not At Risk (10/03/2023)   Humiliation, Afraid, Rape, and Kick questionnaire    Fear of Current or Ex-Partner: No    Emotionally Abused: No    Physically Abused: No    Sexually Abused: No     FAMILY HISTORY:  Family History  Problem Relation Age of Onset   Atrial fibrillation Mother        Passed January 2024.   Colon cancer Mother        Diagnosed 2007   Heart Problems Father    Heart attack Father    Heart disease Father  REVIEW OF SYSTEMS:  Review of Systems  Constitutional:  Negative for chills and fever.  Respiratory:  Positive for shortness of breath. Negative for cough.   Cardiovascular:  Negative for chest pain and palpitations.  Gastrointestinal:  Negative for abdominal pain, constipation, diarrhea, nausea and vomiting.  Genitourinary:  Negative for dysuria and urgency.  Neurological:  Positive for weakness. Negative for headaches.  All other systems reviewed and are negative.   VITAL SIGNS:  Temp:  [97.5 F (36.4 C)-98.1 F (36.7 C)] 97.8 F (36.6 C) (02/18 0600) Pulse Rate:  [99-126] 99 (02/18 0600) Resp:  [17-26] 24 (02/18 0600) BP: (100-141)/(65-96) 133/69 (02/18 0600) SpO2:  [93 %-99 %] 93 % (02/18 0600) Weight:  [120.2 kg] 120.2 kg (02/17 0948)     Height: 5\' 4"  (162.6 cm) Weight: 120.2 kg BMI (Calculated): 45.46   INTAKE/OUTPUT:  02/17 0701 - 02/18 0700 In: 200 [IV Piggyback:200] Out: -   PHYSICAL EXAM:  Physical Exam Vitals and nursing note reviewed. Exam conducted with a chaperone present.  Constitutional:      General: She is not in acute distress.    Appearance: Normal appearance. She is obese. She is not  ill-appearing.     Comments: Patient resting in bed; NAD  HENT:     Head: Normocephalic and atraumatic.  Eyes:     General: No scleral icterus.    Conjunctiva/sclera: Conjunctivae normal.     Comments: Wearing glasses   Cardiovascular:     Rate and Rhythm: Tachycardia present.     Pulses: Normal pulses.  Pulmonary:     Effort: Pulmonary effort is normal. No respiratory distress.  Abdominal:     General: Abdomen is protuberant. There is distension.     Palpations: Abdomen is soft.     Tenderness: There is no abdominal tenderness. There is no guarding or rebound.     Comments: Abdomen is soft, non-tender, she is distended, no rebound/guarding. She is not peritonitic.   Genitourinary:    Comments: Deferred Musculoskeletal:     Right lower leg: No edema.     Left lower leg: No edema.  Skin:    General: Skin is warm and dry.  Neurological:     General: No focal deficit present.     Mental Status: She is alert and oriented to person, place, and time.  Psychiatric:        Mood and Affect: Mood normal.        Behavior: Behavior normal.      Labs:     Latest Ref Rng & Units 10/10/2023   10:04 AM 10/06/2023    4:28 AM 10/05/2023    5:45 AM  CBC  WBC 4.0 - 10.5 K/uL 22.3  0.2  0.2   Hemoglobin 12.0 - 15.0 g/dL 16.1  09.6  9.0   Hematocrit 36.0 - 46.0 % 36.7  30.8  25.8   Platelets 150 - 400 K/uL 72  25  40       Latest Ref Rng & Units 10/10/2023   10:04 AM 10/06/2023    4:28 AM 10/05/2023    5:45 AM  CMP  Glucose 70 - 99 mg/dL 045  409  811   BUN 6 - 20 mg/dL 56  33  34   Creatinine 0.44 - 1.00 mg/dL 9.14  7.82  9.56   Sodium 135 - 145 mmol/L 131  128  128   Potassium 3.5 - 5.1 mmol/L 4.0  3.4  3.8   Chloride 98 - 111  mmol/L 95  95  94   CO2 22 - 32 mmol/L 18  23  23    Calcium 8.9 - 10.3 mg/dL 9.8  8.5  8.7      Imaging studies:   CT Abdomen/Pelvis (10/10/2023) personally reviewed there is noted peritoneal/omental implants, small bowel and colon dilation noted, there  is ascites present, no free air, and radiologist report reviewed below:  IMPRESSION: 1. No acute intrathoracic pathology. No CT evidence of pulmonary artery embolus. 2. Probable enteritis with associated ileus and diarrheal state. Clinical correlation is recommended. 3. Mild bilateral hydronephrosis relatively similar to prior CT. 4. Slight progression of omental implants and nodularity since the prior CT. 5. L1 compression fracture, new since the prior CT, likely acute or subacute. 6.  Aortic Atherosclerosis (ICD10-I70.0).   Assessment/Plan:  58 y.o. 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 Fortunately, she is passing flatus and had BM, so likely not completely obstructed. Unfortunately, there is not much to offer from a general surgery perspective in this case as there will potentially be significant scarring/inflammatory changes intra-abdominally (ie: frozen abdomen) and she would be at extreme risk of complication (bowel injury, fistula, perioperative death)    - We can trial CLD cautiously - IV Abx for PNA per primary service - Monitor abdominal examination; on-going bowel function - Pain control prn; antiemetics prn - Mobilize as feasible    - Further management per primary service; we will follow   All of the above findings and recommendations were discussed with the patient, and all of patient's questions were answered to her expressed satisfaction.  Thank you for the opportunity to participate in this patient's care.   -- Lynden Oxford, PA-C [facility] 10/11/2023, 7:17 AM M-F: 7am - 4pm

## 2023-10-11 NOTE — Consult Note (Signed)
 NAME: Tonya [name]  DOB: [birth year]  MRN: [id number]  Date/Time: 10/11/2023 2:20 PM  REQUESTING PROVIDER: Dr,Wieting Subjective:  REASON FOR CONSULT: Bacteremia ? Tonya [name] is a 58 y.o. 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  It was thought to be due to [facility] and she was given laxatives.  On August 29, 2023 when she saw her PCP and the pain was worsening he sent her for a stat CT scan.  And that showed extensive abdominal and retroperitoneal adenopathy, uterine enlargement and suggestion of soft tissue fullness within the lower uterine segment and cervix.  There was periuterine interstitial thickening.  Omental nodularity was noted.  Peritoneal thickening was noted.  The impression was widespread metastatic disease including to the abdominal pelvic and lower thoracic nodes omentum and lungs.  Mild bilateral hydronephrosis and proximal hydroureter.  She was referred to GYN oncology and saw them on 09/01/2023 and underwent pelvic examination and biopsies and Pap smear.  But that was limited.  The cervical biopsy was consistent with squamous mucosa and abundant necrotic debris but not definitely diagnostic for malignancy.  FNA of the retroperitoneal lymph node was done on 09/13/23 and showed poorly differentiated carcinoma with neuroendocrine features.  She saw oncology Dr. Smith Robert on 09/21/2023.  Port was placed on 09/23/2023 by interventional radiology..  Patient got her first chemo on 09/26/2023. 09/27/23 and 09/28/23 She received carboplatin and etoposide and also received Decadron,  She got pegfilgrastim on 09/30/23   Patient presented to the ED on 10/02/2023 with diarrhea, weakness and congestion. She took  miralax and prune juice for constipation over the previous few days She is not able to eat. Mouth sore and dry She has some shortness of breath which made her come to ED She was neutropenic, no fever, blood culture from 2/9  had strep viridans thopught to be due to translocation from GIT due to neutropenia and diarrhea., The stool tested positive for cdiff antigen , toxin negative and PCR positive and she was prescribed PO vancomycin for a total of 14 days And after IV ceftriaxone and repeat blood culture from 2/12 neg for any bacteria she was discharged home on Po amoxicillin to complete 1 week ( total of 10 days). On 10/08/23 the blood culture finalized with another bacteria called actinomyces naeslundii. The significance was unclear though this could be from GI or GU or oral cavity-The amoxicillin was extended for another week by the hospitalist.    Pt came to the ED on 2/16 with worsening SOB, weakness since she went home Vitals in the ED  10/10/23 09:47  BP 137/96 (H)  Temp 98.1 F (36.7 C)  Pulse Rate 126 !  Resp 20  SpO2 99 %     Latest Reference Range & Units 10/10/23 10:04  WBC 4.0 - 10.5 K/uL 22.3 (H)  Hemoglobin 12.0 - 15.0 g/dL 69.6  HCT 29.5 - 28.4 % 36.7  Platelets 150 - 400 K/uL 72 (L) [1]  Creatinine 0.44 - 1.00 mg/dL 1.32 (H)   CXR showed patchy opacity over left phrenic angle also noted was new L1 compression fracture CTA of lung showed  No PE or infiltrate Faint diffuse hazy density throughout the lungs, likely atelectasis. Air was noted in the uterus, slight progression of omental implants and  nodularity Bilateral mild hydronephrosis as before Pt was started on vanco and cefepime No blood culture was sent I am asked to see the patient for the same She is feeling very tired Even walking a few steps can make her breathless  Past Medical History:  Diagnosis Date   Cancer (HCC)    Coronary artery disease    GERD (gastroesophageal reflux disease)     Hypertension    Hypothyroidism    Myocardial infarction (HCC)    Thyroid disease     Past Surgical History:  Procedure Laterality Date   CARDIAC CATHETERIZATION     CESAREAN SECTION     CORONARY STENT INTERVENTION N/A 07/24/2019   Procedure: CORONARY STENT INTERVENTION;  Surgeon: Yvonne Kendall, MD;  Location: [facility] INVASIVE CV LAB;  Service: Cardiovascular;  Laterality: N/A;  RCA & CFX   IR IMAGING GUIDED PORT INSERTION  09/23/2023   LEFT HEART CATH AND CORONARY ANGIOGRAPHY N/A 07/24/2019   Procedure: LEFT HEART CATH AND CORONARY ANGIOGRAPHY;  Surgeon: Lamar Blinks, MD;  Location: [facility] INVASIVE CV LAB;  Service: Cardiovascular;  Laterality: N/A;   TONSILLECTOMY      Social History   Socioeconomic History   Marital status: Married    Spouse name: Danene Montijo   Number of children: 1   Years of education: Not on file   Highest education level: Not on file  Occupational History   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[employer])  Tobacco Use   Smoking status: Never   Smokeless tobacco: Never  Vaping Use   Vaping status: Never Used  Substance and Sexual Activity   Alcohol use: Not Currently    Comment: occ   Drug use: Never   Sexual activity: Not Currently  Other Topics Concern   Not on file  Social History Narrative   Not on file   Social Drivers of Health   Financial Resource Strain: Low Risk  (07/23/2019)   Overall Financial Resource Strain (CARDIA)    Difficulty of Paying Living Expenses: Not hard at all  Food Insecurity: No Food Insecurity (10/11/2023)   Hunger Vital Sign    Worried About Running Out of Food in the Last Year: Never true    Ran Out of Food in the Last Year: Never true  Transportation Needs: No Transportation Needs (10/11/2023)   PRAPARE - Administrator, Civil Service (Medical): No    Lack of Transportation (Non-Medical): No  Physical Activity: Unknown (07/23/2019)   Exercise Vital Sign    Days of Exercise per Week: 2 days    Minutes of  Exercise per Session: Not on file  Stress: No Stress Concern Present (07/23/2019)   [employer] of Occupational Health - Occupational Stress Questionnaire    Feeling of Stress : Not at all  Social Connections: Moderately Integrated (10/11/2023)   Social Connection and Isolation Panel [NHANES]    Frequency of Communication with Friends and Family: More than three times a week    Frequency of Social Gatherings with Friends and Family: Twice a week    Attends Religious Services: 1 to 4 times per year    Active Member of [employer] or Organizations: No    Attends Banker Meetings: Never    Marital Status: Married  Catering manager Violence: Not At Risk (10/11/2023)   Humiliation, Afraid, Rape, and Kick questionnaire    Fear of Current or Ex-Partner: No    Emotionally Abused: No    Physically Abused: No    Sexually Abused: No  Family History  Problem Relation Age of Onset   Atrial fibrillation Mother        Passed January 2024.   Colon cancer Mother        Diagnosed 2007   Heart Problems Father    Heart attack Father    Heart disease Father    No Known Allergies I? Current Facility-Administered Medications  Medication Dose Route Frequency Provider Last Rate Last Admin   ceFEPIme (MAXIPIME) 2 g in sodium chloride 0.9 % 100 mL IVPB  2 g Intravenous Q8H Floydene Flock, MD   Stopped at 10/11/23 7253   insulin aspart (novoLOG) injection 0-9 Units  0-9 Units Subcutaneous TID WC Floydene Flock, MD   1 Units at 10/10/23 1707   metoprolol tartrate (LOPRESSOR) tablet 12.5 mg  12.5 mg Oral BID Alford Highland, MD   12.5 mg at 10/11/23 1224   morphine (PF) 2 MG/ML injection 2 mg  2 mg Intravenous Q2H PRN Floydene Flock, MD   2 mg at 10/10/23 1725   ondansetron (ZOFRAN) tablet 4 mg  4 mg Oral Q6H PRN Floydene Flock, MD       Or   ondansetron [facility]) injection 4 mg  4 mg Intravenous Q6H PRN Floydene Flock, MD       oxyCODONE (Oxy IR/ROXICODONE) immediate release tablet  5 mg  5 mg Oral TID PRN Alford Highland, MD       thyroid (ARMOUR) tablet 60 mg  60 mg Oral QAC breakfast Alford Highland, MD   60 mg at 10/11/23 0830   vancomycin (VANCOCIN) capsule 125 mg  125 mg Oral QID Synthia Innocent, RPH   125 mg at 10/11/23 6644   Current Outpatient Medications  Medication Sig Dispense Refill   amoxicillin (AMOXIL) 500 MG capsule Take 1,000 mg by mouth 3 (three) times daily.     amoxicillin (AMOXIL) 500 MG tablet Take 2 tablets (1,000 mg total) by mouth 3 (three) times daily for 7 days. Start on 2/14 42 tablet 0   ERGOCALCIFEROL PO Take 1 capsule by mouth every 7 (seven) days.     lidocaine-prilocaine (EMLA) cream Apply to affected area once 30 g 3   losartan (COZAAR) 25 MG tablet Take 50 mg by mouth daily.     Multiple Vitamin (MULTI-VITAMIN) tablet Take 1 tablet by mouth daily.     ondansetron (ZOFRAN) 8 MG tablet Take 1 tablet (8 mg total) by mouth every 8 (eight) hours as needed for nausea, vomiting or refractory nausea / vomiting. Start on the third day after carboplatin. 30 tablet 1   oxyCODONE (OXY IR/ROXICODONE) 5 MG immediate release tablet Take 5 mg by mouth 3 (three) times daily as needed for moderate pain (pain score 4-6).     prochlorperazine (COMPAZINE) 10 MG tablet Take 1 tablet (10 mg total) by mouth every 6 (six) hours as needed for nausea or vomiting. 30 tablet 1   sodium chloride 1 g tablet Take 1 tablet (1 g total) by mouth 2 (two) times daily with a meal. 60 tablet 1   thyroid (ARMOUR) 120 MG tablet Take 60 mg by mouth daily before breakfast.     vancomycin (VANCOCIN) 125 MG capsule Take 1 capsule (125 mg total) by mouth every 6 (six) hours for 10 days. 40 capsule 0   Vitamin D, Ergocalciferol, (DRISDOL) 1.25 MG (50000 UNIT) CAPS capsule Take 50,000 Units by mouth once a week.       Abtx:  Anti-infectives (From admission, onward)  Start     Dose/Rate Route Frequency Ordered Stop   10/11/23 1600  vancomycin (VANCOREADY) IVPB 1250 mg/250 mL   Status:  Discontinued       Placed in "Followed by" Linked Group   1,250 mg 166.7 mL/hr over 90 Minutes Intravenous Every 24 hours 10/10/23 1536 10/10/23 1613   10/11/23 1000  vancomycin (VANCOCIN) capsule 125 mg  Status:  Discontinued        125 mg Oral 4 times daily 10/11/23 0714 10/11/23 0714   10/11/23 1000  vancomycin (VANCOCIN) capsule 125 mg        125 mg Oral 4 times daily 10/11/23 0718     10/11/23 0715  vancomycin (VANCOCIN) capsule 125 mg  Status:  Discontinued        125 mg Oral Every 6 hours 10/11/23 0714 10/11/23 0718   10/10/23 2200  ceFEPIme (MAXIPIME) 2 g in sodium chloride 0.9 % 100 mL IVPB        2 g 200 mL/hr over 30 Minutes Intravenous Every 8 hours 10/10/23 1536     10/10/23 1600  metroNIDAZOLE (FLAGYL) IVPB 500 mg  Status:  Discontinued        500 mg 100 mL/hr over 60 Minutes Intravenous Every 12 hours 10/10/23 1513 10/10/23 1611   10/10/23 1600  vancomycin (VANCOREADY) IVPB 1500 mg/300 mL  Status:  Discontinued       Placed in "Followed by" Linked Group   1,500 mg 150 mL/hr over 120 Minutes Intravenous  Once 10/10/23 1536 10/10/23 1613   10/10/23 1445  vancomycin (VANCOCIN) IVPB 1000 mg/200 mL premix        1,000 mg 200 mL/hr over 60 Minutes Intravenous  Once 10/10/23 1431 10/10/23 1618   10/10/23 1445  ceFEPIme (MAXIPIME) 2 g in sodium chloride 0.9 % 100 mL IVPB        2 g 200 mL/hr over 30 Minutes Intravenous  Once 10/10/23 1431 10/10/23 1514       REVIEW OF SYSTEMS:  Const: negative fever, negative chills,+ weight loss Eyes: negative diplopia or visual changes, negative eye pain ENT: negative coryza, negative sore throat Resp: negative cough, hemoptysis, has dyspnea Cards: negative for chest pain, palpitations, lower extremity edema GU: negative for frequency, dysuria and hematuria GI: abdominal fullness, poor taste, dry mouth, no diarrhea Skin: negative for rash and pruritus Heme: easy bruising  MS: fatigue and weakness Neurolo:negative for  headaches, dizziness, vertigo, memory problems  Psych:  anxiety, depression  Endocrine: hypothyroid, diabetes Allergy/Immunology- negative for any medication or food allergies ?  Objective:  VITALS:  BP 138/82   Pulse (!) 104   Temp 98.1 F (36.7 C) (Oral)   Resp 18   Ht 5\' 4"  (1.626 m)   Wt 120.2 kg   LMP 07/22/2016   SpO2 96%   BMI 45.49 kg/m  LDA Foley Central line Other drainage tubes PHYSICAL EXAM:  General: Alert, cooperative, no distress, appears stated age.  Head: Normocephalic, without obvious abnormality, atraumatic. Eyes: Conjunctivae clear, anicteric sclerae. Pupils are equal ENT Nares normal. No drainage or sinus tenderness. Lips, mucosa, and tongue normal. No Thrush Neck: Supple, symmetrical, no adenopathy, thyroid: non tender no carotid bruit and no JVD. Back: No CVA tenderness. Lungs: Clear to auscultation bilaterally. No Wheezing or Rhonchi. No rales. Heart: Regular rate and rhythm, no murmur, rub or gallop.   Abdomen: Soft, bruising of abdominal wall   Extremities: edema legs Skin: as above Lymph: Cervical, supraclavicular normal. Neurologic: Grossly non-focal Pertinent Labs Lab Results  CBC    Component Value Date/Time   WBC 24.4 (H) 10/11/2023 0625   RBC 3.76 (L) 10/11/2023 0625   HGB 11.5 (L) 10/11/2023 0625   HGB 14.2 09/26/2023 0824   HCT 32.8 (L) 10/11/2023 0625   PLT 76 (L) 10/11/2023 0625   PLT 460 (H) 09/26/2023 0824   MCV 87.2 10/11/2023 0625   MCH 30.6 10/11/2023 0625   MCHC 35.1 10/11/2023 0625   RDW 14.6 10/11/2023 0625   LYMPHSABS 0.4 (L) 10/03/2023 0633   MONOABS 0.0 (L) 10/03/2023 0633   EOSABS 0.0 10/03/2023 0633   BASOSABS 0.0 10/03/2023 0633       Latest Ref Rng & Units 10/11/2023    6:25 AM 10/10/2023   10:04 AM 10/06/2023    4:28 AM  CMP  Glucose 70 - 99 mg/dL 161  096  045   BUN 6 - 20 mg/dL 52  56  33   Creatinine 0.44 - 1.00 mg/dL 4.09  8.11  9.14   Sodium 135 - 145 mmol/L 134  131  128   Potassium 3.5 -  5.1 mmol/L 4.3  4.0  3.4   Chloride 98 - 111 mmol/L 100  95  95   CO2 22 - 32 mmol/L 23  18  23    Calcium 8.9 - 10.3 mg/dL 9.4  9.8  8.5       Microbiology: Recent Results (from the past 240 hours)  Resp panel by RT-PCR (RSV, Flu A&B, Covid) Anterior Nasal Swab     Status: None   Collection Time: 10/02/23  3:36 PM   Specimen: Anterior Nasal Swab  Result Value Ref Range Status   SARS Coronavirus 2 by RT PCR NEGATIVE NEGATIVE Final    Comment: (NOTE) SARS-CoV-2 target nucleic acids are NOT DETECTED.  The SARS-CoV-2 RNA is generally detectable in upper respiratory specimens during the acute phase of infection. The lowest concentration of SARS-CoV-2 viral copies this assay can detect is 138 copies/mL. A negative result does not preclude SARS-Cov-2 infection and should not be used as the sole basis for treatment or other patient management decisions. A negative result may occur with  improper specimen collection/handling, submission of specimen other than nasopharyngeal swab, presence of viral mutation(s) within the areas targeted by this assay, and inadequate number of viral copies(<138 copies/mL). A negative result must be combined with clinical observations, patient history, and epidemiological information. The expected result is Negative.  Fact Sheet for Patients:  BloggerCourse.com  Fact Sheet for Healthcare Providers:  SeriousBroker.it  This test is no t yet approved or cleared by the [place] FDA and  has been authorized for detection and/or diagnosis of SARS-CoV-2 by FDA under an Emergency Use Authorization (EUA). This EUA will remain  in effect (meaning this test can be used) for the duration of the COVID-19 declaration under Section 564(b)(1) of the Act, 21 U.S.C.section 360bbb-3(b)(1), unless the authorization is terminated  or revoked sooner.       Influenza A by PCR NEGATIVE NEGATIVE Final   Influenza B by  PCR NEGATIVE NEGATIVE Final    Comment: (NOTE) The Xpert Xpress SARS-CoV-2/FLU/RSV plus assay is intended as an aid in the diagnosis of influenza from Nasopharyngeal swab specimens and should not be used as a sole basis for treatment. Nasal washings and aspirates are unacceptable for Xpert Xpress SARS-CoV-2/FLU/RSV testing.  Fact Sheet for Patients: BloggerCourse.com  Fact Sheet for Healthcare Providers: SeriousBroker.it  This test is not yet approved or cleared by the [place] and  has been authorized for detection and/or diagnosis of SARS-CoV-2 by FDA under an Emergency Use Authorization (EUA). This EUA will remain in effect (meaning this test can be used) for the duration of the COVID-19 declaration under Section 564(b)(1) of the Act, 21 U.S.C. section 360bbb-3(b)(1), unless the authorization is terminated or revoked.     Resp Syncytial Virus by PCR NEGATIVE NEGATIVE Final    Comment: (NOTE) Fact Sheet for Patients: BloggerCourse.com  Fact Sheet for Healthcare Providers: SeriousBroker.it  This test is not yet approved or cleared by the [place] FDA and has been authorized for detection and/or diagnosis of SARS-CoV-2 by FDA under an Emergency Use Authorization (EUA). This EUA will remain in effect (meaning this test can be used) for the duration of the COVID-19 declaration under Section 564(b)(1) of the Act, 21 U.S.C. section 360bbb-3(b)(1), unless the authorization is terminated or revoked.  Performed at [facility], [address] [place], [place] [address]   Culture, blood (routine x 2)     Status: None   Collection Time: 10/02/23  3:36 PM   Specimen: BLOOD  Result Value Ref Range Status   Specimen Description BLOOD RIGHT ANTECUBITAL  Final   Special Requests   Final    BOTTLES DRAWN AEROBIC AND ANAEROBIC Blood Culture results may not be  optimal due to an inadequate volume of blood received in culture bottles   Culture   Final    NO GROWTH 5 DAYS Performed at [facility], [address] [place], [place] [address]    Report Status 10/07/2023 FINAL  Final  Culture, blood (routine x 2)     Status: Abnormal   Collection Time: 10/02/23  4:13 PM   Specimen: BLOOD LEFT HAND  Result Value Ref Range Status   Specimen Description   Final    BLOOD LEFT HAND Performed at [facility], [address] [place], [place] [address]    Special Requests   Final    BOTTLES DRAWN AEROBIC AND ANAEROBIC Blood Culture results may not be optimal due to an inadequate volume of blood received in culture bottles Performed at [facility], [address] [place], [place] [address]    Culture  Setup Time   Final    GRAM POSITIVE COCCI ANAEROBIC BOTTLE ONLY CRITICAL RESULT CALLED TO, READ BACK BY AND VERIFIED WITH: MERRILL, K. 0944 10/03/2023 [facility] AEROBIC BOTTLE ONLY GRAM POSITIVE RODS CRITICAL RESULT CALLED TO, READ BACK BY AND VERIFIED WITH:  Mila Merry AT 0004 10/06/23 JG    Culture (A)  Final    VIRIDANS STREPTOCOCCUS ACTINOMYCES NAESLUNDII Standardized susceptibility testing for this organism is not available. Performed at [facility], [address] [place], [place] [address]    Report Status 10/08/2023 FINAL  Final  Blood Culture ID Panel (Reflexed)     Status: Abnormal   Collection Time: 10/02/23  4:13 PM  Result Value Ref Range Status   Enterococcus faecalis NOT DETECTED NOT DETECTED Final   Enterococcus Faecium NOT DETECTED NOT DETECTED Final   Listeria monocytogenes NOT DETECTED NOT DETECTED Final   Staphylococcus species NOT DETECTED NOT DETECTED Final   Staphylococcus aureus (BCID) NOT DETECTED NOT DETECTED Final   Staphylococcus epidermidis NOT DETECTED NOT DETECTED Final   Staphylococcus lugdunensis NOT DETECTED NOT DETECTED Final   Streptococcus species DETECTED (A) NOT DETECTED Final     Comment: Not Enterococcus species, Streptococcus agalactiae, Streptococcus pyogenes, or Streptococcus pneumoniae. CRITICAL RESULT CALLED TO, READ BACK BY AND VERIFIED  WITH: Blase Mess 0865 10/03/2023 [facility]    Streptococcus agalactiae NOT DETECTED NOT DETECTED Final   Streptococcus pneumoniae NOT DETECTED NOT DETECTED Final   Streptococcus pyogenes NOT DETECTED NOT DETECTED Final   A.calcoaceticus-baumannii NOT DETECTED NOT DETECTED Final   Bacteroides fragilis NOT DETECTED NOT DETECTED Final   Enterobacterales NOT DETECTED NOT DETECTED Final   Enterobacter cloacae complex NOT DETECTED NOT DETECTED Final   Escherichia coli NOT DETECTED NOT DETECTED Final   Klebsiella aerogenes NOT DETECTED NOT DETECTED Final   Klebsiella oxytoca NOT DETECTED NOT DETECTED Final   Klebsiella pneumoniae NOT DETECTED NOT DETECTED Final   Proteus species NOT DETECTED NOT DETECTED Final   Salmonella species NOT DETECTED NOT DETECTED Final   Serratia marcescens NOT DETECTED NOT DETECTED Final   Haemophilus influenzae NOT DETECTED NOT DETECTED Final   Neisseria meningitidis NOT DETECTED NOT DETECTED Final   Pseudomonas aeruginosa NOT DETECTED NOT DETECTED Final   Stenotrophomonas maltophilia NOT DETECTED NOT DETECTED Final   Candida albicans NOT DETECTED NOT DETECTED Final   Candida auris NOT DETECTED NOT DETECTED Final   Candida glabrata NOT DETECTED NOT DETECTED Final   Candida krusei NOT DETECTED NOT DETECTED Final   Candida parapsilosis NOT DETECTED NOT DETECTED Final   Candida tropicalis NOT DETECTED NOT DETECTED Final   Cryptococcus neoformans/gattii NOT DETECTED NOT DETECTED Final    Comment: Performed at [facility], [address] [place], [place] [address]  Gastrointestinal Panel by PCR , Stool     Status: None   Collection Time: 10/02/23  5:54 PM   Specimen: Stool  Result Value Ref Range Status   Campylobacter species NOT DETECTED NOT DETECTED Final   Plesimonas shigelloides NOT  DETECTED NOT DETECTED Final   Salmonella species NOT DETECTED NOT DETECTED Final   Yersinia enterocolitica NOT DETECTED NOT DETECTED Final   Vibrio species NOT DETECTED NOT DETECTED Final   Vibrio cholerae NOT DETECTED NOT DETECTED Final   Enteroaggregative E coli (EAEC) NOT DETECTED NOT DETECTED Final   Enteropathogenic E coli (EPEC) NOT DETECTED NOT DETECTED Final   Enterotoxigenic E coli (ETEC) NOT DETECTED NOT DETECTED Final   Shiga like toxin producing E coli (STEC) NOT DETECTED NOT DETECTED Final   Shigella/Enteroinvasive E coli (EIEC) NOT DETECTED NOT DETECTED Final   Cryptosporidium NOT DETECTED NOT DETECTED Final   Cyclospora cayetanensis NOT DETECTED NOT DETECTED Final   Entamoeba histolytica NOT DETECTED NOT DETECTED Final   Giardia lamblia NOT DETECTED NOT DETECTED Final   Adenovirus F40/41 NOT DETECTED NOT DETECTED Final   Astrovirus NOT DETECTED NOT DETECTED Final   Norovirus GI/GII NOT DETECTED NOT DETECTED Final   Rotavirus A NOT DETECTED NOT DETECTED Final   Sapovirus (I, II, IV, and V) NOT DETECTED NOT DETECTED Final    Comment: Performed at [facility], [address] [place], [place] [address]  C Difficile Quick Screen w PCR reflex     Status: Abnormal   Collection Time: 10/02/23  5:54 PM   Specimen: Stool  Result Value Ref Range Status   C Diff antigen POSITIVE (A) NEGATIVE Final   C Diff toxin NEGATIVE NEGATIVE Final   C Diff interpretation Results are indeterminate. See PCR results.  Final    Comment: Performed at [facility], [address] [place], [place] [address]  C. Diff by PCR, Reflexed     Status: Abnormal   Collection Time: 10/02/23  5:54 PM  Result Value Ref Range Status   Toxigenic C. Difficile by  PCR POSITIVE (A) NEGATIVE Final    Comment: Positive for toxigenic C. difficile with little to no toxin production. Only treat if clinical presentation suggests symptomatic illness. CRITICAL RESULT CALLED TO, READ BACK BY AND  VERIFIED WITH: PAM RYAN RN @ 2032 10/02/23 [facility] Performed at [facility], [address] [place], [place] [address]   Culture, blood (Routine X 2) w Reflex to ID Panel     Status: None   Collection Time: 10/05/23  6:41 AM   Specimen: BLOOD  Result Value Ref Range Status   Specimen Description BLOOD BLOOD LEFT ARM  Final   Special Requests   Final    BOTTLES DRAWN AEROBIC AND ANAEROBIC Blood Culture adequate volume   Culture   Final    NO GROWTH 5 DAYS Performed at [name], [address] [place], [place] [address]    Report Status 10/10/2023 FINAL  Final  Culture, blood (Routine X 2) w Reflex to ID Panel     Status: None   Collection Time: 10/05/23  6:46 AM   Specimen: BLOOD  Result Value Ref Range Status   Specimen Description BLOOD BLOOD RIGHT HAND  Final   Special Requests   Final    BOTTLES DRAWN AEROBIC AND ANAEROBIC Blood Culture adequate volume   Culture   Final    NO GROWTH 5 DAYS Performed at [facility], [address] [place], [place] [address]    Report Status 10/10/2023 FINAL  Final  Resp panel by RT-PCR (RSV, Flu A&B, Covid) Anterior Nasal Swab     Status: None   Collection Time: 10/10/23 12:36 PM   Specimen: Anterior Nasal Swab  Result Value Ref Range Status   SARS Coronavirus 2 by RT PCR NEGATIVE NEGATIVE Final    Comment: (NOTE) SARS-CoV-2 target nucleic acids are NOT DETECTED.  The SARS-CoV-2 RNA is generally detectable in upper respiratory specimens during the acute phase of infection. The lowest concentration of SARS-CoV-2 viral copies this assay can detect is 138 copies/mL. A negative result does not preclude SARS-Cov-2 infection and should not be used as the sole basis for treatment or other patient management decisions. A negative result may occur with  improper specimen collection/handling, submission of specimen other than nasopharyngeal swab, presence of viral mutation(s) within the areas targeted by this  assay, and inadequate number of viral copies(<138 copies/mL). A negative result must be combined with clinical observations, patient history, and epidemiological information. The expected result is Negative.  Fact Sheet for Patients:  BloggerCourse.com  Fact Sheet for Healthcare Providers:  SeriousBroker.it  This test is no t yet approved or cleared by the [place] FDA and  has been authorized for detection and/or diagnosis of SARS-CoV-2 by FDA under an Emergency Use Authorization (EUA). This EUA will remain  in effect (meaning this test can be used) for the duration of the COVID-19 declaration under Section 564(b)(1) of the Act, 21 U.S.C.section 360bbb-3(b)(1), unless the authorization is terminated  or revoked sooner.       Influenza A by PCR NEGATIVE NEGATIVE Final   Influenza B by PCR NEGATIVE NEGATIVE Final    Comment: (NOTE) The Xpert Xpress SARS-CoV-2/FLU/RSV plus assay is intended as an aid in the diagnosis of influenza from Nasopharyngeal swab specimens and should not be used as a sole basis for treatment. Nasal washings and aspirates are unacceptable for Xpert Xpress SARS-CoV-2/FLU/RSV testing.  Fact Sheet for Patients: BloggerCourse.com  Fact Sheet for Healthcare Providers: SeriousBroker.it  This test is not yet  approved or cleared by the [place] and has been authorized for detection and/or diagnosis of SARS-CoV-2 by FDA under an Emergency Use Authorization (EUA). This EUA will remain in effect (meaning this test can be used) for the duration of the COVID-19 declaration under Section 564(b)(1) of the Act, 21 U.S.C. section 360bbb-3(b)(1), unless the authorization is terminated or revoked.     Resp Syncytial Virus by PCR NEGATIVE NEGATIVE Final    Comment: (NOTE) Fact Sheet for Patients: BloggerCourse.com  Fact Sheet for  Healthcare Providers: SeriousBroker.it  This test is not yet approved or cleared by the [place] FDA and has been authorized for detection and/or diagnosis of SARS-CoV-2 by FDA under an Emergency Use Authorization (EUA). This EUA will remain in effect (meaning this test can be used) for the duration of the COVID-19 declaration under Section 564(b)(1) of the Act, 21 U.S.C. section 360bbb-3(b)(1), unless the authorization is terminated or revoked.  Performed at [facility], [address] [place], [place] [address]   Respiratory (~20 pathogens) panel by PCR     Status: None   Collection Time: 10/10/23  4:21 PM   Specimen: Nasopharyngeal Swab; Respiratory  Result Value Ref Range Status   Adenovirus NOT DETECTED NOT DETECTED Final   Coronavirus 229E NOT DETECTED NOT DETECTED Final    Comment: (NOTE) The Coronavirus on the Respiratory Panel, DOES NOT test for the novel  Coronavirus (2019 nCoV)    Coronavirus HKU1 NOT DETECTED NOT DETECTED Final   Coronavirus NL63 NOT DETECTED NOT DETECTED Final   Coronavirus OC43 NOT DETECTED NOT DETECTED Final   Metapneumovirus NOT DETECTED NOT DETECTED Final   Rhinovirus / Enterovirus NOT DETECTED NOT DETECTED Final   Influenza A NOT DETECTED NOT DETECTED Final   Influenza B NOT DETECTED NOT DETECTED Final   Parainfluenza Virus 1 NOT DETECTED NOT DETECTED Final   Parainfluenza Virus 2 NOT DETECTED NOT DETECTED Final   Parainfluenza Virus 3 NOT DETECTED NOT DETECTED Final   Parainfluenza Virus 4 NOT DETECTED NOT DETECTED Final   Respiratory Syncytial Virus NOT DETECTED NOT DETECTED Final   Bordetella pertussis NOT DETECTED NOT DETECTED Final   Bordetella Parapertussis NOT DETECTED NOT DETECTED Final   Chlamydophila pneumoniae NOT DETECTED NOT DETECTED Final   Mycoplasma pneumoniae NOT DETECTED NOT DETECTED Final    Comment: Performed at [facility], [address] [place], [place] [address]     IMAGING RESULTS:  I have personally reviewed the films Li compression fracture   CT chest - hazy density ? Impression/Recommendation Pt presenting with fatigue weakness and shortnes sof breath Was recently in hospital for severe neutropenia post chemo for Endomerial/cervical cancer with neuroendocrine features and had diarrhea and diagnosed with cdiff ( toxin neg) , strep viridans bacteremia and then actinomyces in blood culture- went home on appropriate antibiotic  Leucocytosis is secondary to Peg filgrastin she received on 09/30/23 I do not see any source for sepsis- this is more SIRS Tachycardia and sob could be from anemia  Anemia Thrombocytopenia Due to chemo  Fatigue - multifactorial - severe hypothyroidism ( on 2/3 TSH is 22) consider repeating it or adjusting synthroid dose) postt chemo Check cortisol level  Recent strep viridans and actinomyces bacteria in blood 1/4- both are GI /GU organism- low bioburden and delay in time to positivity Can be due to translocation when she had neutropenia and diarrhea causing transient bacteremia.Repeat blood culture from 2/7 neg I do not find evidence for a deep seated infection like  abdominal abscess, uterine source ( though air in uterus could be post biopsy/procedure), port not the culprit So will treat with total of 2 weeks of antibiotic- while in house treat with ceftriaxone and then can switch to PO amoxicillin 1 gram TID until 10/16/23  Recent cdiff infection  ( Toxin neg, antigen positive and PCR positive) on Po vancomycin 125mg  q6. Continue until 10/20/23?  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 _I have personally spent  --75-minutes involved in face-to-face and non-face-to-face activities for this patient on the day of the visit. Professional time spent includes the following activities: Preparing to see the patient (review of tests), Obtaining and/or reviewing separately obtained history (admission/discharge record), Performing a medically appropriate examination and/or evaluation , Ordering medications/tests/procedures, referring and communicating with other health care professionals, Documenting clinical information in the EMR, Independently interpreting results (not separately reported), Communicating results to the patient/family/caregiver, Counseling and educating the patient/family/caregiver and Care coordination (not separately reported).    ________________________________________________ Discussed with patient, requesting provider Note:  This document was prepared using Dragon voice recognition software and may include unintentional dictation errors.

## 2023-10-11 NOTE — Hospital Course (Signed)
 58 y.o. female with medical history significant of obesity, endometrial cancer, hypertension, SIADH, hypothyroidism, chronic HFpEF, recent diagnosis of C. difficile as well as strep bacteremia presenting with sepsis, pneumonia.  Patient noted to have been admitted February 9-February 13 for issues including sepsis related to C. difficile diarrhea.  Also with blood cultures positive for strep.  ID consulted with concern for gut translocation in setting of bacteremia.  Was given course of oral amoxicillin at discharge as well as oral vancomycin.  Patient reports resolution of diarrhea.  Does report worsening shortness of breath over the past 2 to 3 days.  Positive malaise.  No chest pain.  Worsening abdominal pain and distention.  Noted baseline endometrial cancer with noted metastatic disease on January CT abdomen pelvis.  Recently started on chemotherapy.  Followed by Dr. Smith Robert.  Mild orthopnea.  No focal hemiparesis or confusion. Presented to the ER afebrile, heart rate in the 100s, BP stable.  Satting well room air.  White count 22.3, hemoglobin 13.2, platelets 72, troponin 20s, procalcitonin 2.36, lactate 2.5.  COVID flu and RSV negative.  Creatinine 1.2.  Glucose 180. CXR w/ L sided PNA.  2/18.  Patient complained of fast heart rate with walking around swelling of the lower extremities abdominal distention, diarrhea, shortness of breath and weakness.

## 2023-10-11 NOTE — Assessment & Plan Note (Signed)
 See how patient does with physical therapy and Occupational Therapy.

## 2023-10-11 NOTE — Telephone Encounter (Signed)
 Tonya [name]- can you help arranging wheelchair? Who do we need to reach out to regarding transportation?

## 2023-10-11 NOTE — Consult Note (Incomplete)
 Hematology/Oncology Consult note [facility] [place] Telephone:([phone] [phone]  Patient Care Team: Danella Penton, MD as PCP - General (Internal Medicine) End, Cristal Deer, MD as PCP - Cardiology (Cardiology) Benita Gutter, RN as Oncology Nurse Navigator   Name of the patient: Tonya [name]  [id number]  [birth year]    Reason for consult: Metastatic endometrial cancer with neuroendocrine features admitted for diarrhea   Requesting physician: Dr. 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  1 week following treatment patient has had 2 episodes of hospitalization mainly for generalized weakness and diarrhea.  She was treated last week for sepsis secondary to C. difficile and blood cultures at that time for positive for Streptococcus.  She was discharged on oral amoxicillin and vancomycin.  She was readmitted to the hospital for worsening shortness of breath.  Blood cultures again positive for Streptococcus viridans and actinomyces.   The patient had a repeat CT angio chest for PE which did not show any evidence of pulmonary embolism or pulmonary metastases.  No acute intrathoracic pathology.  There was a new L1 compression fracture noted but was not reported to be pathologic.  CT abdomen and pelvis with contrast showed enteritis with associated ileus and diarrheal state.  Similar mild bilateral hydronephrosis with possible slight progression of omental implants.  ECOG PS- 3  Pain scale- 4   Review of systems- Review of Systems  Constitutional:  Positive for malaise/fatigue. Negative for chills, fever and weight loss.  HENT:  Negative for congestion, ear discharge and nosebleeds.   Eyes:  Negative for blurred vision.   Respiratory:  Negative for cough, hemoptysis, sputum production, shortness of breath and wheezing.   Cardiovascular:  Negative for chest pain, palpitations, orthopnea and claudication.  Gastrointestinal:  Positive for diarrhea. Negative for abdominal pain, blood in stool, constipation, heartburn, melena, nausea and vomiting.  Genitourinary:  Negative for dysuria, flank pain, frequency, hematuria and urgency.  Musculoskeletal:  Negative for back pain, joint pain and myalgias.  Skin:  Negative for rash.  Neurological:  Negative for dizziness, tingling, focal weakness, seizures, weakness and headaches.  Endo/Heme/Allergies:  Does not bruise/bleed easily.  Psychiatric/Behavioral:  Negative for depression and suicidal ideas. The patient does not have insomnia.     No Known Allergies  Patient Active Problem List   Diagnosis Date Noted   Closed compression fracture of body of L1 vertebra (HCC) 10/11/2023   Thrombocytopenia (HCC) 10/10/2023   Bacteremia due to Streptococcus 10/05/2023   Hypophosphatemia 10/04/2023   AKI (acute kidney injury) (HCC) 10/03/2023   Hyponatremia 10/03/2023   Metabolic acidosis 10/03/2023   Neutropenia (HCC) 10/03/2023   C. 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) 09/21/2023   Endometrial carcinoma (HCC) 09/21/2023   Coronary artery disease involving native coronary artery of native heart with angina pectoris (HCC) 07/24/2022   Ischemic cardiomyopathy 08/09/2019   Essential hypertension 08/09/2019   Hyperlipidemia associated with type 2 diabetes mellitus (HCC) 08/09/2019   Morbid obesity (HCC) 08/09/2019   Diabetes mellitus (HCC) 08/09/2019   NSTEMI (non-ST elevated myocardial infarction) (HCC) 07/23/2019   Hypothyroidism (acquired) 07/23/2019     Past Medical History:  Diagnosis Date   Cancer (HCC)    Coronary artery disease  GERD (gastroesophageal reflux disease)    Hypertension    Hypothyroidism    Myocardial infarction (HCC)    Thyroid disease      Past Surgical History:  Procedure Laterality Date   CARDIAC CATHETERIZATION     CESAREAN SECTION     CORONARY STENT INTERVENTION N/A 07/24/2019   Procedure: CORONARY STENT INTERVENTION;  Surgeon: Yvonne Kendall, MD;  Location: [facility] INVASIVE CV LAB;  Service: Cardiovascular;  Laterality: N/A;  RCA & CFX   IR IMAGING GUIDED PORT INSERTION  09/23/2023   LEFT HEART CATH AND CORONARY ANGIOGRAPHY N/A 07/24/2019   Procedure: LEFT HEART CATH AND CORONARY ANGIOGRAPHY;  Surgeon: Lamar Blinks, MD;  Location: [facility] INVASIVE CV LAB;  Service: Cardiovascular;  Laterality: N/A;   TONSILLECTOMY      Social History   Socioeconomic History   Marital status: Married    Spouse name: Drucilla Cumber   Number of children: 1   Years of education: Not on file   Highest education level: Not on file  Occupational History   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[employer])  Tobacco Use   Smoking status: Never   Smokeless tobacco: Never  Vaping Use   Vaping status: Never Used  Substance and Sexual Activity   Alcohol use: Not Currently    Comment: occ   Drug use: Never   Sexual activity: Not Currently  Other Topics Concern   Not on file  Social History Narrative   Not on file   Social Drivers of Health   Financial Resource Strain: Low Risk  (07/23/2019)   Overall Financial Resource Strain (CARDIA)    Difficulty of Paying Living Expenses: Not hard at all  Food Insecurity: No Food Insecurity (10/11/2023)   Hunger Vital Sign    Worried About Running Out of Food in the Last Year: Never true    Ran Out of Food in the Last Year: Never true  Transportation Needs: No Transportation Needs (10/11/2023)   PRAPARE - Administrator, Civil Service (Medical): No    Lack of Transportation (Non-Medical): No  Physical Activity: Unknown (07/23/2019)   Exercise Vital Sign    Days  of Exercise per Week: 2 days    Minutes of Exercise per Session: Not on file  Stress: No Stress Concern Present (07/23/2019)   [employer] of Occupational Health - Occupational Stress Questionnaire    Feeling of Stress : Not at all  Social Connections: Moderately Integrated (10/11/2023)   Social Connection and Isolation Panel [NHANES]    Frequency of Communication with Friends and Family: More than three times a week    Frequency of Social Gatherings with Friends and Family: Twice a week    Attends Religious Services: 1 to 4 times per year    Active Member of [employer] or Organizations: No    Attends Banker Meetings: Never    Marital Status: Married  Catering manager Violence: Not At Risk (10/11/2023)   Humiliation, Afraid, Rape, and Kick questionnaire    Fear of Current or Ex-Partner: No    Emotionally Abused: No    Physically Abused: No    Sexually Abused: No     Family History  Problem Relation Age of Onset   Atrial fibrillation Mother        Passed January 2024.   Colon cancer Mother        Diagnosed 2007   Heart Problems Father    Heart attack Father    Heart disease Father  Current Facility-Administered Medications:    cefTRIAXone (ROCEPHIN) 2 g in sodium chloride 0.9 % 100 mL IVPB, 2 g, Intravenous, Q24H, Ravishankar, Jayashree, MD   insulin aspart (novoLOG) injection 0-9 Units, 0-9 Units, Subcutaneous, TID WC, Floydene Flock, MD, 1 Units at 10/10/23 1707   metoprolol tartrate (LOPRESSOR) tablet 12.5 mg, 12.5 mg, Oral, BID, Renae Gloss, Richard, MD, 12.5 mg at 10/11/23 1224   morphine (PF) 2 MG/ML injection 2 mg, 2 mg, Intravenous, Q2H PRN, Floydene Flock, MD, 2 mg at 10/10/23 1725   ondansetron (ZOFRAN) tablet 4 mg, 4 mg, Oral, Q6H PRN **OR** ondansetron (ZOFRAN) injection 4 mg, 4 mg, Intravenous, Q6H PRN, Floydene Flock, MD   oxyCODONE (Oxy IR/ROXICODONE) immediate release tablet 5 mg, 5 mg, Oral, TID PRN, Alford Highland, MD   thyroid (ARMOUR)  tablet 60 mg, 60 mg, Oral, QAC breakfast, Alford Highland, MD, 60 mg at 10/11/23 0830   Physical exam:  Vitals:   10/11/23 1330 10/11/23 1430 10/11/23 1436 10/11/23 1513  BP: 131/68 126/72  138/70  Pulse: (!) 102 100  (!) 102  Resp:  (!) 23    Temp:   98 F (36.7 C) 98.2 F (36.8 C)  TempSrc:      SpO2: 95% 98%  96%  Weight:      Height:       Physical Exam Cardiovascular:     Rate and Rhythm: Normal rate and regular rhythm.     Heart sounds: Normal heart sounds.  Pulmonary:     Effort: Pulmonary effort is normal.     Comments: Breath sounds decreased over bases Abdominal:     General: Bowel sounds are normal.     Palpations: Abdomen is soft.  Musculoskeletal:     Right lower leg: Edema present.     Left lower leg: Edema present.  Skin:    General: Skin is warm and dry.  Neurological:     Mental Status: She is alert and oriented to person, place, and time.           Latest Ref Rng & Units 10/11/2023    6:25 AM  CMP  Glucose 70 - 99 mg/dL 403   BUN 6 - 20 mg/dL 52   Creatinine 4.74 - 1.00 mg/dL 2.59   Sodium 563 - 875 mmol/L 134   Potassium 3.5 - 5.1 mmol/L 4.3   Chloride 98 - 111 mmol/L 100   CO2 22 - 32 mmol/L 23   Calcium 8.9 - 10.3 mg/dL 9.4       Latest Ref Rng & Units 10/11/2023    6:25 AM  CBC  WBC 4.0 - 10.5 K/uL 24.4   Hemoglobin 12.0 - 15.0 g/dL 64.3   Hematocrit 32.9 - 46.0 % 32.8   Platelets 150 - 400 K/uL 76     @IMAGES @  CT Angio Chest Pulmonary Embolism (PE) W or WO Contrast Result Date: 10/10/2023 CLINICAL DATA:  Chest and abdominal pain. Concern for pulmonary embolism. Shortness of breath. History of metastatic disease for cervical cancer and chemotherapy. EXAM: CT ANGIOGRAPHY CHEST CT ABDOMEN AND PELVIS WITH CONTRAST TECHNIQUE: Multidetector CT imaging of the chest was performed using the standard protocol during bolus administration of intravenous contrast. Multiplanar CT image reconstructions and MIPs were obtained to evaluate the  vascular anatomy. Multidetector CT imaging of the abdomen and pelvis was performed using the standard protocol during bolus administration of intravenous contrast. RADIATION DOSE REDUCTION: This exam was performed according to the departmental dose-optimization program which includes automated  exposure control, adjustment of the mA and/or kV according to patient size and/or use of iterative reconstruction technique. CONTRAST:  OMNIPAQUE IOHEXOL 350 MG/ML SOLN COMPARISON:  Chest radiograph dated 10/10/2023. CT abdomen pelvis dated 08/29/2023. FINDINGS: CTA CHEST FINDINGS Cardiovascular: There is no cardiomegaly or pericardial effusion. Three-vessel coronary vascular calcification. Right-sided Port-A-Cath with tip at the cavoatrial junction. Mild atherosclerotic calcification of the thoracic aorta. No aneurysmal dilatation or dissection. The origins of the great vessels of the aortic arch appear patent. No pulmonary artery embolus identified. Mediastinum/Nodes: Top-normal left hilar lymph nodes measure up to 1 cm. Posterior mediastinal adenopathy measure approximately 2 cm in short axis. Top-normal lymph node to the left of the trachea at the level of the thoracic inlet measures 9 mm short axis. Rounded left axillary and subpectoral lymph nodes measure up to 9 mm. The esophagus is grossly unremarkable. No mediastinal fluid collection. Lungs/Pleura: Faint diffuse hazy density throughout the lungs, likely atelectasis. No consolidative changes. There is no pleural effusion or pneumothorax. The central airways are patent. Musculoskeletal: Degenerative changes of the spine. No acute osseous pathology. Review of the MIP images confirms the above findings. CT ABDOMEN and PELVIS FINDINGS No intra-abdominal free air.  Small ascites. Hepatobiliary: The liver is unremarkable. No biliary dilatation. The gallbladder is unremarkable. Pancreas: Unremarkable. No pancreatic ductal dilatation or surrounding inflammatory changes.  Spleen: Normal in size without focal abnormality. Adrenals/Urinary Tract: The adrenal glands are unremarkable. There is mild bilateral hydronephrosis relatively similar to prior CT. The visualized ureters appear unremarkable. The urinary bladder is minimally distended. Stomach/Bowel: Mildly dilated fluid-filled loops of small bowel suggestive of enteritis. Gradual transition likely represents a degree of ileus. No definite obstruction. Mild sigmoid diverticulosis. There is loose stool throughout the colon consistent with diarrheal state. The appendix is unremarkable. Vascular/Lymphatic: Mild aortoiliac atherosclerotic disease. The IVC is unremarkable. No portal venous gas. Retroperitoneal adenopathy relatively similar to prior CT. Reproductive: The uterus is anteverted. Air is noted in the vagina. The ovaries are grossly unremarkable. Other: There is slight progression of omental implants and nodularity since the prior CT. Musculoskeletal: Diffuse subcutaneous edema of the pelvic wall. L1 compression fracture with greater than 50% loss of vertebral body height, new since the prior CT, likely acute or subacute. There is approximately 4 mm retropulsion of the inferior posterior cortex. There is disc desiccation and vacuum phenomena at L1-L2. Review of the MIP images confirms the above findings. IMPRESSION: 1. No acute intrathoracic pathology. No CT evidence of pulmonary artery embolus. 2. Probable enteritis with associated ileus and diarrheal state. Clinical correlation is recommended. 3. Mild bilateral hydronephrosis relatively similar to prior CT. 4. Slight progression of omental implants and nodularity since the prior CT. 5. L1 compression fracture, new since the prior CT, likely acute or subacute. 6.  Aortic Atherosclerosis (ICD10-I70.0). Electronically Signed   By: Elgie Collard M.D.   On: 10/10/2023 20:02   CT ABDOMEN PELVIS W CONTRAST Result Date: 10/10/2023 CLINICAL DATA:  Chest and abdominal pain.  Concern for pulmonary embolism. Shortness of breath. History of metastatic disease for cervical cancer and chemotherapy. EXAM: CT ANGIOGRAPHY CHEST CT ABDOMEN AND PELVIS WITH CONTRAST TECHNIQUE: Multidetector CT imaging of the chest was performed using the standard protocol during bolus administration of intravenous contrast. Multiplanar CT image reconstructions and MIPs were obtained to evaluate the vascular anatomy. Multidetector CT imaging of the abdomen and pelvis was performed using the standard protocol during bolus administration of intravenous contrast. RADIATION DOSE REDUCTION: This exam was performed according to the  departmental dose-optimization program which includes automated exposure control, adjustment of the mA and/or kV according to patient size and/or use of iterative reconstruction technique. CONTRAST:  OMNIPAQUE IOHEXOL 350 MG/ML SOLN COMPARISON:  Chest radiograph dated 10/10/2023. CT abdomen pelvis dated 08/29/2023. FINDINGS: CTA CHEST FINDINGS Cardiovascular: There is no cardiomegaly or pericardial effusion. Three-vessel coronary vascular calcification. Right-sided Port-A-Cath with tip at the cavoatrial junction. Mild atherosclerotic calcification of the thoracic aorta. No aneurysmal dilatation or dissection. The origins of the great vessels of the aortic arch appear patent. No pulmonary artery embolus identified. Mediastinum/Nodes: Top-normal left hilar lymph nodes measure up to 1 cm. Posterior mediastinal adenopathy measure approximately 2 cm in short axis. Top-normal lymph node to the left of the trachea at the level of the thoracic inlet measures 9 mm short axis. Rounded left axillary and subpectoral lymph nodes measure up to 9 mm. The esophagus is grossly unremarkable. No mediastinal fluid collection. Lungs/Pleura: Faint diffuse hazy density throughout the lungs, likely atelectasis. No consolidative changes. There is no pleural effusion or pneumothorax. The central airways are  patent. Musculoskeletal: Degenerative changes of the spine. No acute osseous pathology. Review of the MIP images confirms the above findings. CT ABDOMEN and PELVIS FINDINGS No intra-abdominal free air.  Small ascites. Hepatobiliary: The liver is unremarkable. No biliary dilatation. The gallbladder is unremarkable. Pancreas: Unremarkable. No pancreatic ductal dilatation or surrounding inflammatory changes. Spleen: Normal in size without focal abnormality. Adrenals/Urinary Tract: The adrenal glands are unremarkable. There is mild bilateral hydronephrosis relatively similar to prior CT. The visualized ureters appear unremarkable. The urinary bladder is minimally distended. Stomach/Bowel: Mildly dilated fluid-filled loops of small bowel suggestive of enteritis. Gradual transition likely represents a degree of ileus. No definite obstruction. Mild sigmoid diverticulosis. There is loose stool throughout the colon consistent with diarrheal state. The appendix is unremarkable. Vascular/Lymphatic: Mild aortoiliac atherosclerotic disease. The IVC is unremarkable. No portal venous gas. Retroperitoneal adenopathy relatively similar to prior CT. Reproductive: The uterus is anteverted. Air is noted in the vagina. The ovaries are grossly unremarkable. Other: There is slight progression of omental implants and nodularity since the prior CT. Musculoskeletal: Diffuse subcutaneous edema of the pelvic wall. L1 compression fracture with greater than 50% loss of vertebral body height, new since the prior CT, likely acute or subacute. There is approximately 4 mm retropulsion of the inferior posterior cortex. There is disc desiccation and vacuum phenomena at L1-L2. Review of the MIP images confirms the above findings. IMPRESSION: 1. No acute intrathoracic pathology. No CT evidence of pulmonary artery embolus. 2. Probable enteritis with associated ileus and diarrheal state. Clinical correlation is recommended. 3. Mild bilateral  hydronephrosis relatively similar to prior CT. 4. Slight progression of omental implants and nodularity since the prior CT. 5. L1 compression fracture, new since the prior CT, likely acute or subacute. 6.  Aortic Atherosclerosis (ICD10-I70.0). Electronically Signed   By: Elgie Collard M.D.   On: 10/10/2023 20:02   DG Chest 2 View Result Date: 10/10/2023 CLINICAL DATA:  Shortness of breath EXAM: CHEST - 2 VIEW COMPARISON:  Chest radiograph dated 10/02/2023, CT abdomen and pelvis dated 08/29/2023 FINDINGS: Lines/tubes: Right chest wall port tip projects over the superior cavoatrial junction. Lungs: Well inflated lungs. Patchy opacity projecting over the left costophrenic angle. Pleura: No pneumothorax or pleural effusion. Heart/mediastinum: The heart size and mediastinal contours are within normal limits. Bones: Compression deformity of approximately L1, new from 08/29/2023. IMPRESSION: 1. Patchy opacity projecting over the left costophrenic angle, which may represent atelectasis or pneumonia.  2. New approximately L1 compression deformity. Electronically Signed   By: Agustin Cree M.D.   On: 10/10/2023 15:45   DG Chest Port 1 View Result Date: 10/02/2023 CLINICAL DATA:  Diarrhea. Recently started chemotherapy for cervical cancer. EXAM: PORTABLE CHEST 1 VIEW COMPARISON:  Chest x-ray dated July 23, 2019. FINDINGS: Right chest wall port catheter with tip at the cavoatrial junction. The heart size and mediastinal contours are within normal limits. Normal pulmonary vascularity. No focal consolidation, pleural effusion, or pneumothorax. No acute osseous abnormality. IMPRESSION: No active disease. Electronically Signed   By: Obie Dredge M.D.   On: 10/02/2023 16:14   IR IMAGING GUIDED PORT INSERTION Result Date: 09/23/2023 INDICATION: chemotherapy administration History of gyn malignancy. EXAM: IMPLANTED PORT A CATH PLACEMENT WITH ULTRASOUND AND FLUOROSCOPIC GUIDANCE MEDICATIONS: 25 mg Benadryl IV.  ANESTHESIA/SEDATION: Moderate (conscious) sedation was employed during this procedure. A total of Versed 1 mg and Fentanyl 100 mcg was administered intravenously. Moderate Sedation Time: 21 minutes. The patient's level of consciousness and vital signs were monitored continuously by radiology nursing throughout the procedure under my direct supervision. FLUOROSCOPY TIME:  Fluoroscopic dose; 4 mGy COMPLICATIONS: None immediate. PROCEDURE: The procedure, risks, benefits, and alternatives were explained to the patient. Questions regarding the procedure were encouraged and answered. The patient understands and consents to the procedure. The RIGHT neck and chest were prepped with chlorhexidine in a sterile fashion, and a sterile drape was applied covering the operative field. Maximum barrier sterile technique with sterile gowns and gloves were used for the procedure. A timeout was performed prior to the initiation of the procedure. Local anesthesia was provided with 1% lidocaine with epinephrine. After creating a small venotomy incision, a micropuncture kit was utilized to access the internal jugular vein under direct, real-time ultrasound guidance. Ultrasound image documentation was performed. The microwire was kinked to measure appropriate catheter length. A subcutaneous port pocket was then created along the upper chest wall utilizing a combination of sharp and blunt dissection. The pocket was irrigated with sterile saline. A single lumen power injectable port was chosen for placement. The 8 Fr catheter was tunneled from the port pocket site to the venotomy incision. The port was placed in the pocket. The external catheter was trimmed to appropriate length. At the venotomy, an 8 Fr peel-away sheath was placed over a guidewire under fluoroscopic guidance. The catheter was then placed through the sheath and the sheath was removed. Final catheter positioning was confirmed and documented with a fluoroscopic spot  radiograph. The port was accessed with a Huber needle, aspirated and flushed with heparinized saline. The port pocket incision was closed with interrupted 3-0 Vicryl suture then Dermabond was applied, including at the venotomy incision. Dressings were placed. The patient tolerated the procedure well without immediate post procedural complication. IMPRESSION: Successful placement of a RIGHT internal jugular approach power injectable Port-A-Cath. The tip of the catheter is positioned within the proximal RIGHT atrium. The catheter is ready for immediate use. Roanna Banning, MD [facility] Electronically Signed   By: Roanna Banning M.D.   On: 09/23/2023 14:42    Assessment and plan- Patient is a 58 y.o. 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  Patient requests motorized  wheelchair for her to be able to come down the ramp from her house and to be able to get into her transportation to come to the cancer center.  Wheelchair will need to be arranged upon discharge by social work.  I will be looking into what options we have for transportation to bring her to the cancer center for treatments if she is unable to drive herself.  Discussed the results of CT chest abdomen and pelvis with the patient.  CT chest does not show any evidence of lung mets.  Although CT abdomen shows increase in omental metastases, it is too soon to comment if she is progressing on chemotherapy since she has only received 1 treatment.  Plan is to continue with further chemotherapy treatments as planned  Dyspnea: Suspect this is secondary to deconditioning from  underlying malignancy as well as chemotherapy.  CT chest did not show any evidence of pulmonary embolism, acute pneumonia or lung metastases.  Pancytopenia: Platelet counts are improving.  Hemoglobin is 10.4 likely secondary to underlying malignancy as well as recent chemotherapy.  Leukocytosis could be secondary to sepsis however given the fact that patient did get Neulasta 2 weeks ago we could see transient leukocytosis from that as well.  Lactic acid has now normalized.  I have encouraged her to discuss her stage IV disease and overall prognosis with her husband as he is not entirely aware of her clinical condition.  We will continue goals of care conversation as an outpatient as well.  Patient does wish to continue to try chemotherapy at this point   Thank you for this kind referral and the opportunity to participate in the care of this patient   Visit Diagnosis 1. Sepsis, due to unspecified organism, unspecified whether acute organ dysfunction present (HCC)   2. Shortness of breath     Dr. Owens Shark, MD, MPH [facility] at [facility] [phone] 10/11/2023

## 2023-10-11 NOTE — ED Notes (Signed)
 Pt cleaned up after bowel movement. New brief and bed pads applied.

## 2023-10-11 NOTE — Progress Notes (Signed)
 Progress Note   Patient: Tonya [name] JXB:[id number] DOB: [birth year] DOA: 10/10/2023     1 DOS: the patient was seen and examined on 10/11/2023   Brief hospital course: 58 y.o. female with medical history significant of obesity, endometrial cancer, hypertension, SIADH, hypothyroidism, chronic HFpEF, recent diagnosis of C. difficile as well as strep bacteremia presenting with sepsis, pneumonia.  Patient noted to have been admitted February 9-February 13 for issues including sepsis related to C. difficile diarrhea.  Also with blood cultures positive for strep.  ID consulted with concern for gut translocation in setting of bacteremia.  Was given course of oral amoxicillin at discharge as well as oral vancomycin.  Patient reports resolution of diarrhea.  Does report worsening shortness of breath over the past 2 to 3 days.  Positive malaise.  No chest pain.  Worsening abdominal pain and distention.  Noted baseline endometrial cancer with noted metastatic disease on January CT abdomen pelvis.  Recently started on chemotherapy.  Followed by Dr. Smith Robert.  Mild orthopnea.  No focal hemiparesis or confusion. Presented to the ER afebrile, heart rate in the 100s, BP stable.  Satting well room air.  White count 22.3, hemoglobin 13.2, platelets 72, troponin 20s, procalcitonin 2.36, lactate 2.5.  COVID flu and RSV negative.  Creatinine 1.2.  Glucose 180. CXR w/ L sided PNA.  2/18.  Patient complained of fast heart rate with walking around swelling of the lower extremities abdominal distention, diarrhea, shortness of breath and weakness.  Assessment and Plan: Severe sepsis (HCC) Present on admission with tachycardia and leukocytosis.  Recent positive blood cultures with Streptococcus viridans and actinomyces.  C. difficile infection present on last admission.  Possibility of pneumonia.  Lactic acidosis. Patient on oral vancomycin for C. difficile and placed on Maxipime.  Will get ID consultation for help with  antibiotics.  C. difficile colitis Oral vancomycin.  Endometrial carcinoma (HCC) CT scan showing omental implants.  Dr. Smith Robert from oncology to see.  With abdominal distention General Surgery started on clear liquid diet.  Closed compression fracture of body of L1 vertebra (HCC) See how patient does with physical therapy and Occupational Therapy.  Thrombocytopenia (HCC) Platelets 76  Bacteremia due to Streptococcus blood culture positive for strep, ID consulted, likely gut translocation. Treated with ceftriaxone, transitioned to amoxicillin at discharge  Case discussed with Dr. Rivka Safer  Holding amoxicilllin while on cefepime  Monitor   Hyponatremia Sodium 134  Coronary artery disease involving native coronary artery of native heart with angina pectoris (HCC) Low-dose metoprolol added  Diabetes mellitus (HCC) Sliding scale ordered.  Essential hypertension Add low-dose oral metoprolol secondary to fast heart rate and high blood pressure   Ischemic cardiomyopathy 2D ECHO 07/2019 w/ EF 45-50%  Appears euvolemic    Hypothyroidism (acquired) Continue armour thyroid         Subjective: Patient recently discharged from the hospital with C. difficile colitis and positive blood culture was sent home on amoxicillin and oral vancomycin.  Coming back to the hospital with shortness of breath fast heart rate with moving around weakness and some leg swelling.  Patient had a CT scan that showed enteritis and omental implants and an L1 compression fracture but no pulmonary embolism.  Admitted with sepsis.  Physical Exam: Vitals:   10/11/23 1008 10/11/23 1035 10/11/23 1200 10/11/23 1224  BP:   139/74 138/82  Pulse:   (!) 104 (!) 104  Resp: 15  18   Temp:  98.1 F (36.7 C)    TempSrc:  Oral  SpO2:   96%   Weight:      Height:       Physical Exam HENT:     Head: Normocephalic.  Eyes:     General: Lids are normal.     Conjunctiva/sclera: Conjunctivae normal.   Cardiovascular:     Rate and Rhythm: Regular rhythm. Tachycardia present.     Heart sounds: Normal heart sounds, S1 normal and S2 normal.  Pulmonary:     Breath sounds: Examination of the right-lower field reveals decreased breath sounds. Examination of the left-lower field reveals decreased breath sounds. Decreased breath sounds present. No wheezing, rhonchi or rales.  Abdominal:     Palpations: Abdomen is soft.     Tenderness: There is no abdominal tenderness.  Musculoskeletal:     Right lower leg: Swelling present.     Left lower leg: Swelling present.  Skin:    General: Skin is warm.     Findings: No rash.  Neurological:     Mental Status: She is alert and oriented to person, place, and time.     Data Reviewed: White blood cell count 24.4, hemoglobin 11.5, platelets 76, creatinine 1.23, sodium 134, BNP 308.7, troponin 26 and 25, procalcitonin 2.36, lactic acid 2.5 CT scan of the chest abdomen pelvis reviewed and subjective part of the note Family Communication: Updated husband on the phone  Disposition: Status is: Inpatient Remains inpatient appropriate because: Continue IV and oral antibiotics.  Get physical therapy evaluation  Planned Discharge Destination: To be determined    Time spent: 28 minutes  Author: Alford Highland, MD 10/11/2023 1:21 PM  For on call review [email or web address].

## 2023-10-11 NOTE — ED Notes (Signed)
 Lab called for morning blood draw

## 2023-10-11 NOTE — Evaluation (Signed)
 Occupational Therapy Evaluation Patient Details Name: Tonya [name] MRN: [id number] DOB: 1966-03-13 Today's Date: 10/11/2023   History of Present Illness   Pt is a 58 y.o. female presenting with sepsis, pneumonia and recent C-diff 2/9-2/13. CT findings: acute or subacute L1 compression fracture and probable enteritis with associated ileus and diarrheal state. PMH includes obesity, endometrial cancer-recently started on chemotherapy, hypertension, SIADH, hypothyroidism, chronic HFpEF, recent diagnosis of C. difficile as well as strep bacteremia.     Clinical Impressions Pt was seen for OT evaluation this date. Prior to hospital admission, pt was living at home with her husband and reports MOD I/IND prior to a few weeks ago when she began cancer treatments and started having weakness. Reports began using RW less than a week ago and has been very limited d/t SOB and weakness.   Pt presents to acute OT demonstrating impaired ADL performance and functional mobility 2/2 low activity tolerance and weakness (See OT problem list for additional functional deficits). Pt currently requires Min/CGA for bed mobility via log rolling technique d/t new L1 fx. Edu on back precautions although no formal consult is in. Good seated balance EOB, MIN/CGA for STS from EOB to RW and able to take lateral steps to [facility] with CGA. HR up to 132-returned to seated EOB to rest then pt needing to use BSC. CGA for SPT from bed<>BSC with no AD use. Max A for LB clothing management and posterior hygiene d/t IV in R dominant hand. Mod A to return to supine for BLE management. Able to scoot to [facility] with SUP with bed in trendelenburg. HR is most limiting factor at this time. Pt would benefit from skilled OT services to address noted impairments and functional limitations (see below for any additional details) in order to maximize safety and independence while minimizing falls risk and caregiver burden. Do anticipate the need for follow up OT  services upon acute hospital DC. Pt would prefer to return home with therapy services as long as she is able to progress her mobility and her HR improves.      If plan is discharge home, recommend the following:   A little help with walking and/or transfers;A little help with bathing/dressing/bathroom;Assistance with cooking/housework;Assist for transportation;Help with stairs or ramp for entrance     Functional Status Assessment   Patient has had a recent decline in their functional status and demonstrates the ability to make significant improvements in function in a reasonable and predictable amount of time.     Equipment Recommendations   Wheelchair (measurements OT);BSC/3in1     Recommendations for Other Services         Precautions/Restrictions   Precautions Precautions: Fall Precaution/Restrictions Comments: new L1 fx-back precautions educated although no formal consult in Restrictions Weight Bearing Restrictions Per Provider Order: No     Mobility Bed Mobility Overal bed mobility: Needs Assistance Bed Mobility: Rolling, Sidelying to Sit, Sit to Sidelying Rolling: Min assist (to L side only) Sidelying to sit: Contact guard assist, Min assist, HOB elevated, Used rails     Sit to sidelying: Mod assist General bed mobility comments: Min/CGA to get OOB to L side d/t less pain in abdomen on that side per pt; Mod A for BLE management to return to supine    Transfers Overall transfer level: Needs assistance Equipment used: Rolling walker (2 wheels) Transfers: Sit to/from Stand, Bed to chair/wheelchair/BSC Sit to Stand: Contact guard assist, Min assist     Step pivot transfers: Contact guard assist  General transfer comment: Min/CGA for STS from EOB to RW and CGA for SPT bed<>BSC      Balance Overall balance assessment: Needs assistance Sitting-balance support: Feet supported Sitting balance-Leahy Scale: Good     Standing balance support:  Bilateral upper extremity supported Standing balance-Leahy Scale: Fair Standing balance comment: CGA for SPT to [facility] without AD                           ADL either performed or assessed with clinical judgement   ADL Overall ADL's : Needs assistance/impaired                     Lower Body Dressing: Maximal assistance;Sit to/from stand;Sitting/lateral leans   Toilet Transfer: Contact guard assist;BSC/3in1   Toileting- Clothing Manipulation and Hygiene: Maximal assistance;Total assistance Toileting - Clothing Manipulation Details (indicate cue type and reason): pt was going to, but didn't due to IV on R hand dangling             Vision         Perception         Praxis         Pertinent Vitals/Pain Pain Assessment Pain Assessment: 0-10 Pain Score: 3  Pain Location: abdomen Pain Descriptors / Indicators: Aching Pain Intervention(s): Monitored during session, Repositioned     Extremity/Trunk Assessment Upper Extremity Assessment Upper Extremity Assessment: Overall WFL for tasks assessed   Lower Extremity Assessment Lower Extremity Assessment: Generalized weakness       Communication Communication Communication: No apparent difficulties   Cognition Arousal: Alert Behavior During Therapy: WFL for tasks assessed/performed Cognition: No apparent impairments                               Following commands: Intact       Cueing  General Comments          Exercises Other Exercises Other Exercises: Edu on role of OT and adapting activities, ECS, etc d/t high HR at this time. Other Exercises: Edu on back precautions for L1 compression fx alhtough no formal consult is in.   Shoulder Instructions      Home Living Family/patient expects to be discharged to:: Private residence Living Arrangements: Spouse/significant other Available Help at Discharge: Family;Available PRN/intermittently (pt's husband works during the  day) Type of Home: House Home Access: Stairs to enter [employer] of Steps: 3-4 Entrance Stairs-Rails: None Home Layout: One level     Bathroom Shower/Tub: Chief Strategy Officer: Standard     Home Equipment: Pharmacist, hospital (2 wheels)   Additional Comments: sleeps in a recliner      Prior Functioning/Environment Prior Level of Function : Independent/Modified Independent;Driving;Working/employed             Mobility Comments: no AD use up until this past weekend; 1 fall a few weeks ago ADLs Comments: MOD I/IND; hasn't been able to sponge bathe much d/t increased weakness/SOB since starting cancer treatments    OT Problem List: Decreased strength;Decreased activity tolerance   OT Treatment/Interventions: Self-care/ADL training;Therapeutic exercise;Therapeutic activities;Energy conservation;DME and/or AE instruction;Patient/family education      OT Goals(Current goals can be found in the care plan section)   Acute Rehab OT Goals Patient Stated Goal: improve endurance OT Goal Formulation: With patient Time For Goal Achievement: 10/25/23 Potential to Achieve Goals: Good ADL Goals Pt Will Perform Lower Body Bathing:  with modified independence;sit to/from stand;sitting/lateral leans Pt Will Perform Lower Body Dressing: with modified independence;with adaptive equipment;sitting/lateral leans;sit to/from stand Pt Will Transfer to Toilet: with modified independence;bedside commode;ambulating;regular height toilet Pt Will Perform Toileting - Clothing Manipulation and hygiene: with modified independence;sit to/from stand;sitting/lateral leans   OT Frequency:  Min 1X/week    Co-evaluation              AM-PAC OT "6 Clicks" Daily Activity     Outcome Measure Help from another person eating meals?: None Help from another person taking care of personal grooming?: None Help from another person toileting, which includes using toliet,  bedpan, or urinal?: A Lot Help from another person bathing (including washing, rinsing, drying)?: A Lot Help from another person to put on and taking off regular upper body clothing?: A Little Help from another person to put on and taking off regular lower body clothing?: A Lot 6 Click Score: 17   End of Session Equipment Utilized During Treatment: Rolling walker (2 wheels) Nurse Communication: Mobility status  Activity Tolerance: Patient tolerated treatment well Patient left: in bed;with call bell/phone within reach;with bed alarm set  OT Visit Diagnosis: Other abnormalities of gait and mobility (R26.89);Muscle weakness (generalized) (M62.81)                Time: 4098-1191 OT Time Calculation (min): 53 min Charges:  OT General Charges $OT Visit: 1 Visit OT Evaluation $OT Eval Moderate Complexity: 1 Mod OT Treatments $Self Care/Home Management : 8-22 mins $Therapeutic Activity: 23-37 mins Olamide Lahaie, OTR/L  10/11/23, 4:24 PM  Eleanor Dimichele E Alyxandra Tenbrink 10/11/2023, 4:20 PM

## 2023-10-11 NOTE — Plan of Care (Signed)
  Problem: Fluid Volume: Goal: Hemodynamic stability will improve Outcome: Progressing   Problem: Clinical Measurements: Goal: Diagnostic test results will improve Outcome: Progressing   Problem: Respiratory: Goal: Ability to maintain adequate ventilation will improve Outcome: Progressing   Problem: Coping: Goal: Ability to adjust to condition or change in health will improve Outcome: Progressing   Problem: Fluid Volume: Goal: Ability to maintain a balanced intake and output will improve Outcome: Progressing   Problem: Nutritional: Goal: Maintenance of adequate nutrition will improve Outcome: Progressing   Problem: Tissue Perfusion: Goal: Adequacy of tissue perfusion will improve Outcome: Progressing   Problem: Clinical Measurements: Goal: Diagnostic test results will improve Outcome: Progressing Goal: Respiratory complications will improve Outcome: Progressing Goal: Cardiovascular complication will be avoided Outcome: Progressing   Problem: Activity: Goal: Risk for activity intolerance will decrease Outcome: Progressing   Problem: Nutrition: Goal: Adequate nutrition will be maintained Outcome: Progressing   Problem: Elimination: Goal: Will not experience complications related to urinary retention Outcome: Progressing   Problem: Pain Managment: Goal: General experience of comfort will improve and/or be controlled Outcome: Progressing   Problem: Safety: Goal: Ability to remain free from injury will improve Outcome: Progressing   Problem: Skin Integrity: Goal: Risk for impaired skin integrity will decrease Outcome: Progressing

## 2023-10-12 ENCOUNTER — Encounter: Payer: Self-pay | Admitting: *Deleted

## 2023-10-12 ENCOUNTER — Inpatient Hospital Stay: Payer: BC Managed Care – PPO

## 2023-10-12 ENCOUNTER — Other Ambulatory Visit: Payer: Self-pay | Admitting: Oncology

## 2023-10-12 ENCOUNTER — Encounter: Payer: Self-pay | Admitting: Oncology

## 2023-10-12 DIAGNOSIS — K3189 Other diseases of stomach and duodenum: Secondary | ICD-10-CM | POA: Diagnosis not present

## 2023-10-12 DIAGNOSIS — R188 Other ascites: Secondary | ICD-10-CM | POA: Diagnosis not present

## 2023-10-12 DIAGNOSIS — C7A8 Other malignant neuroendocrine tumors: Secondary | ICD-10-CM

## 2023-10-12 DIAGNOSIS — A0472 Enterocolitis due to Clostridium difficile, not specified as recurrent: Secondary | ICD-10-CM | POA: Diagnosis not present

## 2023-10-12 DIAGNOSIS — A429 Actinomycosis, unspecified: Secondary | ICD-10-CM

## 2023-10-12 DIAGNOSIS — D696 Thrombocytopenia, unspecified: Secondary | ICD-10-CM | POA: Diagnosis not present

## 2023-10-12 DIAGNOSIS — S32010A Wedge compression fracture of first lumbar vertebra, initial encounter for closed fracture: Secondary | ICD-10-CM | POA: Diagnosis not present

## 2023-10-12 DIAGNOSIS — C541 Malignant neoplasm of endometrium: Secondary | ICD-10-CM | POA: Diagnosis not present

## 2023-10-12 DIAGNOSIS — E039 Hypothyroidism, unspecified: Secondary | ICD-10-CM | POA: Diagnosis not present

## 2023-10-12 LAB — CORTISOL-AM, BLOOD: Cortisol - AM: 31.2 ug/dL — ABNORMAL HIGH (ref 6.7–22.6)

## 2023-10-12 LAB — LACTIC ACID, PLASMA: Lactic Acid, Venous: 0.9 mmol/L (ref 0.5–1.9)

## 2023-10-12 LAB — CBC
HCT: 29.8 % — ABNORMAL LOW (ref 36.0–46.0)
Hemoglobin: 10.4 g/dL — ABNORMAL LOW (ref 12.0–15.0)
MCH: 30.5 pg (ref 26.0–34.0)
MCHC: 34.9 g/dL (ref 30.0–36.0)
MCV: 87.4 fL (ref 80.0–100.0)
Platelets: 98 10*3/uL — ABNORMAL LOW (ref 150–400)
RBC: 3.41 MIL/uL — ABNORMAL LOW (ref 3.87–5.11)
RDW: 14.8 % (ref 11.5–15.5)
WBC: 30 10*3/uL — ABNORMAL HIGH (ref 4.0–10.5)
nRBC: 0.6 % — ABNORMAL HIGH (ref 0.0–0.2)

## 2023-10-12 LAB — BASIC METABOLIC PANEL
Anion gap: 11 (ref 5–15)
BUN: 39 mg/dL — ABNORMAL HIGH (ref 6–20)
CO2: 21 mmol/L — ABNORMAL LOW (ref 22–32)
Calcium: 9.1 mg/dL (ref 8.9–10.3)
Chloride: 102 mmol/L (ref 98–111)
Creatinine, Ser: 0.91 mg/dL (ref 0.44–1.00)
GFR, Estimated: >60 mL/min (ref >60)
Glucose, Bld: 100 mg/dL — ABNORMAL HIGH (ref 70–99)
Potassium: 3.3 mmol/L — ABNORMAL LOW (ref 3.5–5.1)
Sodium: 134 mmol/L — ABNORMAL LOW (ref 135–145)

## 2023-10-12 LAB — GLUCOSE, CAPILLARY
Glucose-Capillary: 96 mg/dL (ref 70–99)
Glucose-Capillary: 101 mg/dL — ABNORMAL HIGH (ref 70–99)
Glucose-Capillary: 99 mg/dL (ref 70–99)
Glucose-Capillary: 103 mg/dL — ABNORMAL HIGH (ref 70–99)

## 2023-10-12 LAB — HEPATIC FUNCTION PANEL
ALT: 17 U/L (ref 0–44)
AST: 25 U/L (ref 15–41)
Albumin: 2.2 g/dL — ABNORMAL LOW (ref 3.5–5.0)
Alkaline Phosphatase: 172 U/L — ABNORMAL HIGH (ref 38–126)
Bilirubin, Direct: 0.2 mg/dL (ref 0.0–0.2)
Indirect Bilirubin: 0.8 mg/dL (ref 0.3–0.9)
Total Bilirubin: 1 mg/dL (ref 0.0–1.2)
Total Protein: 5.4 g/dL — ABNORMAL LOW (ref 6.5–8.1)

## 2023-10-12 MED ORDER — VANCOMYCIN HCL 125 MG PO CAPS
125.0000 mg | ORAL_CAPSULE | Freq: Four times a day (QID) | ORAL | Status: DC
  Administered 2023-10-12 – 2023-10-14 (×10): 125 mg via ORAL
  Filled 2023-10-12 (×12): qty 1

## 2023-10-12 MED ORDER — BOOST / RESOURCE BREEZE PO LIQD CUSTOM
1.0000 | Freq: Three times a day (TID) | ORAL | Status: DC
  Administered 2023-10-12: 1 via ORAL

## 2023-10-12 MED ORDER — ENSURE ENLIVE PO LIQD
237.0000 mL | Freq: Three times a day (TID) | ORAL | Status: DC
  Administered 2023-10-12 – 2023-10-14 (×5): 237 mL via ORAL

## 2023-10-12 NOTE — Progress Notes (Signed)
 Date of Admission:  10/10/2023      ID: Tonya [name] is a 58 y.o. female  Active Problems:   Hypothyroidism (acquired)   Ischemic cardiomyopathy   Essential hypertension   Diabetes mellitus (HCC)   Coronary artery disease involving native coronary artery of native heart with angina pectoris (HCC)   Endometrial carcinoma (HCC)   Severe sepsis (HCC)   Hyponatremia   C. difficile colitis   Thrombocytopenia (HCC)   Closed compression fracture of body of L1 vertebra (HCC) Tonya [name] is a 58 y.o. 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  It was thought to be due to [facility] and she was given laxatives.  On August 29, 2023 when she saw her PCP and the pain was worsening he sent her for a stat CT scan.  And that showed extensive abdominal and retroperitoneal adenopathy, uterine enlargement and suggestion of soft tissue fullness within the lower uterine segment and cervix.  There was periuterine interstitial thickening.  Omental nodularity was noted.  Peritoneal thickening was noted.  The impression was widespread metastatic disease including to the abdominal pelvic and lower thoracic nodes omentum and lungs.  Mild bilateral hydronephrosis and proximal hydroureter.  She was referred to GYN oncology and saw them on 09/01/2023 and underwent pelvic examination and biopsies and Pap smear.  But that was limited.  The cervical biopsy was consistent with squamous mucosa and abundant necrotic debris but not definitely diagnostic for malignancy.  FNA of the retroperitoneal lymph node was done on 09/13/23 and showed poorly differentiated carcinoma with neuroendocrine features.  She saw oncology Dr. Smith Robert on  09/21/2023.  Port was placed on 09/23/2023 by interventional radiology..  Patient got her first chemo on 09/26/2023. 09/27/23 and 09/28/23 She received carboplatin and etoposide and also received Decadron,  She got pegfilgrastim on 09/30/23   Patient presented to the ED on 10/02/2023 with diarrhea, weakness and congestion. She took miralax and prune juice for constipation over the previous few days She is not able to eat. Mouth sore and dry She has some shortness of breath which made her come to ED She was neutropenic, no fever, blood culture from 2/9  had strep viridans thopught to be due to translocation from GIT due to neutropenia and diarrhea., The stool tested positive for cdiff antigen , toxin negative and PCR positive and she was prescribed PO vancomycin for a total of 14 days And after IV ceftriaxone and repeat blood culture from 2/12 neg for any bacteria she was discharged home on Po amoxicillin to complete 1 week ( total of 10 days). On 10/08/23 the blood culture finalized with another bacteria called actinomyces naeslundii. The significance was unclear though this could be from GI or GU or oral cavity-The amoxicillin was extended for another week by the hospitalist.   Subjective: Pt is doing better Able to pas gas and abdomen feels lighter Sitting in chair  Medications:   feeding supplement  237 mL Oral TID   insulin aspart  0-9 Units Subcutaneous TID WC   metoprolol tartrate  12.5 mg Oral BID   thyroid  60 mg Oral QAC breakfast   vancomycin  125 mg Oral QID    Objective: Vital signs in last 24 hours: Patient Vitals for the past 24  hrs:  BP Temp Temp src Pulse Resp SpO2  10/12/23 0806 135/71 98.6 F (37 C) -- 99 20 96 %  10/12/23 0247 131/74 99 F (37.2 C) -- (!) 101 16 95 %  10/11/23 2127 132/70 -- -- 100 -- --  10/11/23 2049 132/70 98.2 F (36.8 C) -- -- 18 98 %  10/11/23 1513 138/70 98.2 F (36.8 C) Oral (!) 102 20 96 %  10/11/23 1436 -- 98 F (36.7 C) -- -- -- --  10/11/23  1430 126/72 -- -- 100 (!) 23 98 %  10/11/23 1330 131/68 -- -- (!) 102 -- 95 %  10/11/23 1245 -- -- -- -- (!) 25 --  10/11/23 1230 (!) 142/85 -- -- (!) 104 -- 92 %  10/11/23 1224 138/82 -- -- (!) 104 -- --  10/11/23 1200 139/74 -- -- (!) 104 18 96 %       PHYSICAL EXAM:  General: Alert, cooperative, pale Lungs: Clear to auscultation bilaterally. No Wheezing or Rhonchi. No rales. Heart: Regular rate and rhythm, no murmur, rub or gallop. Abdomen: Soft, upper abdomen fullness Extremities: atraumatic, no cyanosis. No edema. No clubbing Skin: No rashes or lesions. Or bruising Lymph: Cervical, supraclavicular normal. Neurologic: Grossly non-focal  Lab Results    Latest Ref Rng & Units 10/12/2023    5:14 AM 10/11/2023    6:25 AM 10/10/2023   10:04 AM  CBC  WBC 4.0 - 10.5 K/uL 30.0  24.4  22.3   Hemoglobin 12.0 - 15.0 g/dL 04.5  40.9  81.1   Hematocrit 36.0 - 46.0 % 29.8  32.8  36.7   Platelets 150 - 400 K/uL 98  76  72        Latest Ref Rng & Units 10/12/2023    5:14 AM 10/11/2023    6:25 AM 10/10/2023   10:04 AM  CMP  Glucose 70 - 99 mg/dL 914  782  956   BUN 6 - 20 mg/dL 39  52  56   Creatinine 0.44 - 1.00 mg/dL 2.13  0.86  5.78   Sodium 135 - 145 mmol/L 134  134  131   Potassium 3.5 - 5.1 mmol/L 3.3  4.3  4.0   Chloride 98 - 111 mmol/L 102  100  95   CO2 22 - 32 mmol/L 21  23  18    Calcium 8.9 - 10.3 mg/dL 9.1  9.4  9.8   Total Protein 6.5 - 8.1 g/dL 5.4     Total Bilirubin 0.0 - 1.2 mg/dL 1.0     Alkaline Phos 38 - 126 U/L 172     AST 15 - 41 U/L 25     ALT 0 - 44 U/L 17         Microbiology: No BC this admission  Studies/Results: CT Angio Chest Pulmonary Embolism (PE) W or WO Contrast Result Date: 10/10/2023 CLINICAL DATA:  Chest and abdominal pain. Concern for pulmonary embolism. Shortness of breath. History of metastatic disease for cervical cancer and chemotherapy. EXAM: CT ANGIOGRAPHY CHEST CT ABDOMEN AND PELVIS WITH CONTRAST TECHNIQUE: Multidetector CT imaging  of the chest was performed using the standard protocol during bolus administration of intravenous contrast. Multiplanar CT image reconstructions and MIPs were obtained to evaluate the vascular anatomy. Multidetector CT imaging of the abdomen and pelvis was performed using the standard protocol during bolus administration of intravenous contrast. RADIATION DOSE REDUCTION: This exam was performed according to the departmental dose-optimization program which includes automated exposure control, adjustment of the  mA and/or kV according to patient size and/or use of iterative reconstruction technique. CONTRAST:  OMNIPAQUE IOHEXOL 350 MG/ML SOLN COMPARISON:  Chest radiograph dated 10/10/2023. CT abdomen pelvis dated 08/29/2023. FINDINGS: CTA CHEST FINDINGS Cardiovascular: There is no cardiomegaly or pericardial effusion. Three-vessel coronary vascular calcification. Right-sided Port-A-Cath with tip at the cavoatrial junction. Mild atherosclerotic calcification of the thoracic aorta. No aneurysmal dilatation or dissection. The origins of the great vessels of the aortic arch appear patent. No pulmonary artery embolus identified. Mediastinum/Nodes: Top-normal left hilar lymph nodes measure up to 1 cm. Posterior mediastinal adenopathy measure approximately 2 cm in short axis. Top-normal lymph node to the left of the trachea at the level of the thoracic inlet measures 9 mm short axis. Rounded left axillary and subpectoral lymph nodes measure up to 9 mm. The esophagus is grossly unremarkable. No mediastinal fluid collection. Lungs/Pleura: Faint diffuse hazy density throughout the lungs, likely atelectasis. No consolidative changes. There is no pleural effusion or pneumothorax. The central airways are patent. Musculoskeletal: Degenerative changes of the spine. No acute osseous pathology. Review of the MIP images confirms the above findings. CT ABDOMEN and PELVIS FINDINGS No intra-abdominal free air.  Small ascites.  Hepatobiliary: The liver is unremarkable. No biliary dilatation. The gallbladder is unremarkable. Pancreas: Unremarkable. No pancreatic ductal dilatation or surrounding inflammatory changes. Spleen: Normal in size without focal abnormality. Adrenals/Urinary Tract: The adrenal glands are unremarkable. There is mild bilateral hydronephrosis relatively similar to prior CT. The visualized ureters appear unremarkable. The urinary bladder is minimally distended. Stomach/Bowel: Mildly dilated fluid-filled loops of small bowel suggestive of enteritis. Gradual transition likely represents a degree of ileus. No definite obstruction. Mild sigmoid diverticulosis. There is loose stool throughout the colon consistent with diarrheal state. The appendix is unremarkable. Vascular/Lymphatic: Mild aortoiliac atherosclerotic disease. The IVC is unremarkable. No portal venous gas. Retroperitoneal adenopathy relatively similar to prior CT. Reproductive: The uterus is anteverted. Air is noted in the vagina. The ovaries are grossly unremarkable. Other: There is slight progression of omental implants and nodularity since the prior CT. Musculoskeletal: Diffuse subcutaneous edema of the pelvic wall. L1 compression fracture with greater than 50% loss of vertebral body height, new since the prior CT, likely acute or subacute. There is approximately 4 mm retropulsion of the inferior posterior cortex. There is disc desiccation and vacuum phenomena at L1-L2. Review of the MIP images confirms the above findings. IMPRESSION: 1. No acute intrathoracic pathology. No CT evidence of pulmonary artery embolus. 2. Probable enteritis with associated ileus and diarrheal state. Clinical correlation is recommended. 3. Mild bilateral hydronephrosis relatively similar to prior CT. 4. Slight progression of omental implants and nodularity since the prior CT. 5. L1 compression fracture, new since the prior CT, likely acute or subacute. 6.  Aortic Atherosclerosis  (ICD10-I70.0). Electronically Signed   By: Elgie Collard M.D.   On: 10/10/2023 20:02   CT ABDOMEN PELVIS W CONTRAST Result Date: 10/10/2023 CLINICAL DATA:  Chest and abdominal pain. Concern for pulmonary embolism. Shortness of breath. History of metastatic disease for cervical cancer and chemotherapy. EXAM: CT ANGIOGRAPHY CHEST CT ABDOMEN AND PELVIS WITH CONTRAST TECHNIQUE: Multidetector CT imaging of the chest was performed using the standard protocol during bolus administration of intravenous contrast. Multiplanar CT image reconstructions and MIPs were obtained to evaluate the vascular anatomy. Multidetector CT imaging of the abdomen and pelvis was performed using the standard protocol during bolus administration of intravenous contrast. RADIATION DOSE REDUCTION: This exam was performed according to the departmental dose-optimization program which includes  automated exposure control, adjustment of the mA and/or kV according to patient size and/or use of iterative reconstruction technique. CONTRAST:  OMNIPAQUE IOHEXOL 350 MG/ML SOLN COMPARISON:  Chest radiograph dated 10/10/2023. CT abdomen pelvis dated 08/29/2023. FINDINGS: CTA CHEST FINDINGS Cardiovascular: There is no cardiomegaly or pericardial effusion. Three-vessel coronary vascular calcification. Right-sided Port-A-Cath with tip at the cavoatrial junction. Mild atherosclerotic calcification of the thoracic aorta. No aneurysmal dilatation or dissection. The origins of the great vessels of the aortic arch appear patent. No pulmonary artery embolus identified. Mediastinum/Nodes: Top-normal left hilar lymph nodes measure up to 1 cm. Posterior mediastinal adenopathy measure approximately 2 cm in short axis. Top-normal lymph node to the left of the trachea at the level of the thoracic inlet measures 9 mm short axis. Rounded left axillary and subpectoral lymph nodes measure up to 9 mm. The esophagus is grossly unremarkable. No mediastinal fluid  collection. Lungs/Pleura: Faint diffuse hazy density throughout the lungs, likely atelectasis. No consolidative changes. There is no pleural effusion or pneumothorax. The central airways are patent. Musculoskeletal: Degenerative changes of the spine. No acute osseous pathology. Review of the MIP images confirms the above findings. CT ABDOMEN and PELVIS FINDINGS No intra-abdominal free air.  Small ascites. Hepatobiliary: The liver is unremarkable. No biliary dilatation. The gallbladder is unremarkable. Pancreas: Unremarkable. No pancreatic ductal dilatation or surrounding inflammatory changes. Spleen: Normal in size without focal abnormality. Adrenals/Urinary Tract: The adrenal glands are unremarkable. There is mild bilateral hydronephrosis relatively similar to prior CT. The visualized ureters appear unremarkable. The urinary bladder is minimally distended. Stomach/Bowel: Mildly dilated fluid-filled loops of small bowel suggestive of enteritis. Gradual transition likely represents a degree of ileus. No definite obstruction. Mild sigmoid diverticulosis. There is loose stool throughout the colon consistent with diarrheal state. The appendix is unremarkable. Vascular/Lymphatic: Mild aortoiliac atherosclerotic disease. The IVC is unremarkable. No portal venous gas. Retroperitoneal adenopathy relatively similar to prior CT. Reproductive: The uterus is anteverted. Air is noted in the vagina. The ovaries are grossly unremarkable. Other: There is slight progression of omental implants and nodularity since the prior CT. Musculoskeletal: Diffuse subcutaneous edema of the pelvic wall. L1 compression fracture with greater than 50% loss of vertebral body height, new since the prior CT, likely acute or subacute. There is approximately 4 mm retropulsion of the inferior posterior cortex. There is disc desiccation and vacuum phenomena at L1-L2. Review of the MIP images confirms the above findings. IMPRESSION: 1. No acute  intrathoracic pathology. No CT evidence of pulmonary artery embolus. 2. Probable enteritis with associated ileus and diarrheal state. Clinical correlation is recommended. 3. Mild bilateral hydronephrosis relatively similar to prior CT. 4. Slight progression of omental implants and nodularity since the prior CT. 5. L1 compression fracture, new since the prior CT, likely acute or subacute. 6.  Aortic Atherosclerosis (ICD10-I70.0). Electronically Signed   By: Elgie Collard M.D.   On: 10/10/2023 20:02   DG Chest 2 View Result Date: 10/10/2023 CLINICAL DATA:  Shortness of breath EXAM: CHEST - 2 VIEW COMPARISON:  Chest radiograph dated 10/02/2023, CT abdomen and pelvis dated 08/29/2023 FINDINGS: Lines/tubes: Right chest wall port tip projects over the superior cavoatrial junction. Lungs: Well inflated lungs. Patchy opacity projecting over the left costophrenic angle. Pleura: No pneumothorax or pleural effusion. Heart/mediastinum: The heart size and mediastinal contours are within normal limits. Bones: Compression deformity of approximately L1, new from 08/29/2023. IMPRESSION: 1. Patchy opacity projecting over the left costophrenic angle, which may represent atelectasis or pneumonia. 2. New approximately L1 compression  deformity. Electronically Signed   By: Agustin Cree M.D.   On: 10/10/2023 15:45   Impression/recommendation  Pt presenting with fatigue weakness and shortnes sof breath Was recently in hospital for severe neutropenia post chemo for Endomerial/cervical cancer with neuroendocrine features and had diarrhea and diagnosed with cdiff ( toxin neg) , strep viridans bacteremia and then actinomyces in blood culture- went home on appropriate antibiotic   Leucocytosis likely  secondary to Peg filgrastin she received on 09/30/23 It is worsening I do not see any source for sepsis- this is more SIRS Tachycardia and sob could be from anemia   Anemia Thrombocytopenia- improving Due to chemo   Fatigue -  multifactorial - severe hypothyroidism ( on 2/3 TSH is 22) consider repeating it or adjusting synthroid dose) post chemo AM cortisol is 31   Recent strep viridans and actinomyces bacteria in blood 1/4- both are GI /GU organism- low bioburden and delay in time to positivity Can be due to translocation when she had neutropenia and diarrhea causing transient bacteremia.Repeat blood culture from 2/7 neg I do not find evidence for a deep seated infection like abdominal abscess, uterine source ( though air in uterus could be post biopsy/procedure), port not the culprit ultrasound pelvis and transvaginal US to look for pyometra or pelvic abscess and depending on the result will decide on duration of antibiotic    Recent cdiff infection  ( Toxin neg, antigen positive and PCR positive) on Po vancomycin 125mg  q6.    Endometrial /cervical cancer with mets Stage IV ( mets to peritoenum, omentum, peliv, abdominal adenopathy, lung ) with neureoendocrine features- has received ist dose of chemo Followed by heme onc Recent CT shows slight progression in the omental implants which are malignant tissue   L1 compression fracture new since Jan 2025- She recently had a fall at home and it could be due to that No features to suggest discitis or osteo   B/l mild hydronephrosis- is it due to malignancy?   Fluid fluid loops of small intestine- she has some fullness leg edema Has hypoalbuminemia  Discussed with patient and Dr.Rao

## 2023-10-12 NOTE — TOC Progression Note (Signed)
 Transition of Care [facility]) - Progression Note    Patient Details  Name: Tonya [name] MRN: [id number] Date of Birth: 1966-05-06  Transition of Care [facility]) CM/SW Contact  Chapman Fitch, RN Phone Number: 10/12/2023, 2:33 PM  Clinical Narrative:     Attempted to meet with patient to discuss home health recs.  Patient currently off the unit       Expected Discharge Plan and Services                                               Social Determinants of Health (SDOH) Interventions SDOH Screenings   Food Insecurity: No Food Insecurity (10/11/2023)  Housing: Low Risk  (10/11/2023)  Transportation Needs: No Transportation Needs (10/11/2023)  Utilities: Not At Risk (10/11/2023)  Depression (PHQ2-9): Low Risk  (09/21/2023)  Financial Resource Strain: Low Risk  (07/23/2019)  Physical Activity: Unknown (07/23/2019)  Social Connections: Moderately Integrated (10/11/2023)  Stress: No Stress Concern Present (07/23/2019)  Tobacco Use: Low Risk  (10/11/2023)  Health Literacy: Adequate Health Literacy (09/29/2023)    Readmission Risk Interventions    10/05/2023    2:26 PM  Readmission Risk Prevention Plan  Transportation Screening Complete  HRI or Home Care Consult Complete  Palliative Care Screening Complete  Medication Review (RN Care Manager) Complete

## 2023-10-12 NOTE — Progress Notes (Signed)
 Occupational Therapy Treatment Patient Details Name: Tonya [name] MRN: [id number] DOB: 1966/04/26 Today's Date: 10/12/2023   History of present illness Pt is a 58 y.o. female presenting with sepsis, pneumonia and recent C-diff 2/9-2/13. CT findings: acute or subacute L1 compression fracture and probable enteritis with associated ileus and diarrheal state. PMH includes obesity, endometrial cancer-recently started on chemotherapy, hypertension, SIADH, hypothyroidism, chronic HFpEF, recent diagnosis of C. difficile as well as strep bacteremia.   OT comments  Pt is seated in recliner on arrival finishing up with PT. Agreeable to OT session to perform bathing/dressing. Pt performed UB bathing and dressing with set up assist. Pt required Mod A for LB bathing below her knees d/t HR increase to 135 while standing to bathe her peri-area. STS from recliner with SUP. Repositioned to back of recliner with SUP. Edu on LB exercises seated in recliner throughout the day. Pt left seated in recliner with all needs in place and will cont to require skilled acute OT services to maximize her safety and IND to return to PLOF.       If plan is discharge home, recommend the following:  A little help with walking and/or transfers;A little help with bathing/dressing/bathroom;Assistance with cooking/housework;Assist for transportation;Help with stairs or ramp for entrance   Equipment Recommendations  Wheelchair (measurements OT);BSC/3in1    Recommendations for Other Services      Precautions / Restrictions Precautions Precautions: Fall Precaution/Restrictions Comments: new L1 fx-back precautions educated although no formal consult in Restrictions Weight Bearing Restrictions Per Provider Order: No       Mobility Bed Mobility               General bed mobility comments: NT up in recliner pre/post session    Transfers Overall transfer level: Needs assistance Equipment used: Rolling walker (2  wheels) Transfers: Sit to/from Stand Sit to Stand: Supervision           General transfer comment: SUP for STS from recliner to bathe peri-area     Balance Overall balance assessment: Modified Independent Sitting-balance support: Feet supported Sitting balance-Leahy Scale: Normal     Standing balance support: Bilateral upper extremity supported, During functional activity, Reliant on assistive device for balance Standing balance-Leahy Scale: Good Standing balance comment: STS with SUP                           ADL either performed or assessed with clinical judgement   ADL Overall ADL's : Needs assistance/impaired     Grooming: Wash/dry face;Sitting;Set up   Upper Body Bathing: Set up;Sitting   Lower Body Bathing: Moderate assistance;Sit to/from stand;Sitting/lateral leans Lower Body Bathing Details (indicate cue type and reason): d/t HR up to 135 in standing with peri-care only Upper Body Dressing : Supervision/safety;Sitting                          Extremity/Trunk Assessment Upper Extremity Assessment Upper Extremity Assessment: Overall WFL for tasks assessed   Lower Extremity Assessment Lower Extremity Assessment: Generalized weakness   Cervical / Trunk Assessment Cervical / Trunk Assessment: Normal    Vision       Perception     Praxis     Communication Communication Communication: No apparent difficulties   Cognition Arousal: Alert Behavior During Therapy: WFL for tasks assessed/performed  Following commands: Intact        Cueing   Cueing Techniques: Verbal cues  Exercises      Shoulder Instructions       General Comments HR up to 135 standing to bathe peri-area    Pertinent Vitals/ Pain       Pain Assessment Pain Assessment: Faces Faces Pain Scale: Hurts a little bit Pain Location: back Pain Descriptors / Indicators: Discomfort Pain Intervention(s): Monitored during  session, Repositioned  Home Living Family/patient expects to be discharged to:: Private residence Living Arrangements: Spouse/significant other Available Help at Discharge: Family;Available PRN/intermittently Type of Home: House Home Access: Stairs to enter [employer] of Steps: 3-4 Entrance Stairs-Rails: None Home Layout: One level     Bathroom Shower/Tub: Chief Strategy Officer: Standard     Home Equipment: Pharmacist, hospital (2 wheels)   Additional Comments: sleeps in a recliner      Prior Functioning/Environment              Frequency  Min 1X/week        Progress Toward Goals  OT Goals(current goals can now be found in the care plan section)  Progress towards OT goals: Progressing toward goals  Acute Rehab OT Goals Patient Stated Goal: increase protein intake OT Goal Formulation: With patient Time For Goal Achievement: 10/25/23 Potential to Achieve Goals: Good  Plan      Co-evaluation                 AM-PAC OT "6 Clicks" Daily Activity     Outcome Measure   Help from another person eating meals?: None Help from another person taking care of personal grooming?: None Help from another person toileting, which includes using toliet, bedpan, or urinal?: A Little Help from another person bathing (including washing, rinsing, drying)?: A Little Help from another person to put on and taking off regular upper body clothing?: None Help from another person to put on and taking off regular lower body clothing?: A Little 6 Click Score: 21    End of Session Equipment Utilized During Treatment: Rolling walker (2 wheels)  OT Visit Diagnosis: Other abnormalities of gait and mobility (R26.89);Muscle weakness (generalized) (M62.81)   Activity Tolerance Patient tolerated treatment well   Patient Left with call bell/phone within reach;in chair   Nurse Communication Mobility status        Time: 1478-2956 OT Time  Calculation (min): 24 min  Charges: OT General Charges $OT Visit: 1 Visit OT Treatments $Self Care/Home Management : 23-37 mins  Tonya [name], OTR/L  10/12/23, 12:29 PM   Tonya [name] E Tonya [name] 10/12/2023, 12:24 PM

## 2023-10-12 NOTE — Progress Notes (Signed)
 CC: Abdominal Distention,  Subjective: Patient reports doing well this morning.  She was able to work with therapy yesterday.  She tolerated a clear liquid diet and has been having bowel movements.  She says she still feels a little full but it is about at her baseline.  She reports that she feels gas rumbling coming out of  Objective: Vital signs in last 24 hours: Temp:  [98 F (36.7 C)-99 F (37.2 C)] 98.6 F (37 C) (02/19 0806) Pulse Rate:  [99-104] 99 (02/19 0806) Resp:  [16-25] 20 (02/19 0806) BP: (126-142)/(68-85) 135/71 (02/19 0806) SpO2:  [92 %-98 %] 96 % (02/19 0806) Last BM Date : 10/11/23  Intake/Output from previous day: 02/18 0701 - 02/19 0700 In: 220 [P.O.:120; IV Piggyback:100] Out: -  Intake/Output this shift: No intake/output data recorded.  Physical exam:  Abdomen is soft, distended with some tympany, some pain in the upper abdomen mostly in the left upper quadrant, abdominal bruising  Lab Results: CBC  Recent Labs    10/11/23 0625 10/12/23 0514  WBC 24.4* 30.0*  HGB 11.5* 10.4*  HCT 32.8* 29.8*  PLT 76* 98*   BMET Recent Labs    10/11/23 0625 10/12/23 0514  NA 134* 134*  K 4.3 3.3*  CL 100 102  CO2 23 21*  GLUCOSE 129* 100*  BUN 52* 39*  CREATININE 1.23* 0.91  CALCIUM 9.4 9.1   PT/INR No results for input(s): "LABPROT", "INR" in the last 72 hours. ABG No results for input(s): "PHART", "HCO3" in the last 72 hours.  Invalid input(s): "PCO2", "PO2"  Studies/Results: CT Angio Chest Pulmonary Embolism (PE) W or WO Contrast Result Date: 10/10/2023 CLINICAL DATA:  Chest and abdominal pain. Concern for pulmonary embolism. Shortness of breath. History of metastatic disease for cervical cancer and chemotherapy. EXAM: CT ANGIOGRAPHY CHEST CT ABDOMEN AND PELVIS WITH CONTRAST TECHNIQUE: Multidetector CT imaging of the chest was performed using the standard protocol during bolus administration of intravenous contrast. Multiplanar CT image  reconstructions and MIPs were obtained to evaluate the vascular anatomy. Multidetector CT imaging of the abdomen and pelvis was performed using the standard protocol during bolus administration of intravenous contrast. RADIATION DOSE REDUCTION: This exam was performed according to the departmental dose-optimization program which includes automated exposure control, adjustment of the mA and/or kV according to patient size and/or use of iterative reconstruction technique. CONTRAST:  OMNIPAQUE IOHEXOL 350 MG/ML SOLN COMPARISON:  Chest radiograph dated 10/10/2023. CT abdomen pelvis dated 08/29/2023. FINDINGS: CTA CHEST FINDINGS Cardiovascular: There is no cardiomegaly or pericardial effusion. Three-vessel coronary vascular calcification. Right-sided Port-A-Cath with tip at the cavoatrial junction. Mild atherosclerotic calcification of the thoracic aorta. No aneurysmal dilatation or dissection. The origins of the great vessels of the aortic arch appear patent. No pulmonary artery embolus identified. Mediastinum/Nodes: Top-normal left hilar lymph nodes measure up to 1 cm. Posterior mediastinal adenopathy measure approximately 2 cm in short axis. Top-normal lymph node to the left of the trachea at the level of the thoracic inlet measures 9 mm short axis. Rounded left axillary and subpectoral lymph nodes measure up to 9 mm. The esophagus is grossly unremarkable. No mediastinal fluid collection. Lungs/Pleura: Faint diffuse hazy density throughout the lungs, likely atelectasis. No consolidative changes. There is no pleural effusion or pneumothorax. The central airways are patent. Musculoskeletal: Degenerative changes of the spine. No acute osseous pathology. Review of the MIP images confirms the above findings. CT ABDOMEN and PELVIS FINDINGS No intra-abdominal free air.  Small ascites. Hepatobiliary: The liver is  unremarkable. No biliary dilatation. The gallbladder is unremarkable. Pancreas: Unremarkable. No pancreatic  ductal dilatation or surrounding inflammatory changes. Spleen: Normal in size without focal abnormality. Adrenals/Urinary Tract: The adrenal glands are unremarkable. There is mild bilateral hydronephrosis relatively similar to prior CT. The visualized ureters appear unremarkable. The urinary bladder is minimally distended. Stomach/Bowel: Mildly dilated fluid-filled loops of small bowel suggestive of enteritis. Gradual transition likely represents a degree of ileus. No definite obstruction. Mild sigmoid diverticulosis. There is loose stool throughout the colon consistent with diarrheal state. The appendix is unremarkable. Vascular/Lymphatic: Mild aortoiliac atherosclerotic disease. The IVC is unremarkable. No portal venous gas. Retroperitoneal adenopathy relatively similar to prior CT. Reproductive: The uterus is anteverted. Air is noted in the vagina. The ovaries are grossly unremarkable. Other: There is slight progression of omental implants and nodularity since the prior CT. Musculoskeletal: Diffuse subcutaneous edema of the pelvic wall. L1 compression fracture with greater than 50% loss of vertebral body height, new since the prior CT, likely acute or subacute. There is approximately 4 mm retropulsion of the inferior posterior cortex. There is disc desiccation and vacuum phenomena at L1-L2. Review of the MIP images confirms the above findings. IMPRESSION: 1. No acute intrathoracic pathology. No CT evidence of pulmonary artery embolus. 2. Probable enteritis with associated ileus and diarrheal state. Clinical correlation is recommended. 3. Mild bilateral hydronephrosis relatively similar to prior CT. 4. Slight progression of omental implants and nodularity since the prior CT. 5. L1 compression fracture, new since the prior CT, likely acute or subacute. 6.  Aortic Atherosclerosis (ICD10-I70.0). Electronically Signed   By: Elgie Collard M.D.   On: 10/10/2023 20:02   CT ABDOMEN PELVIS W CONTRAST Result Date:  10/10/2023 CLINICAL DATA:  Chest and abdominal pain. Concern for pulmonary embolism. Shortness of breath. History of metastatic disease for cervical cancer and chemotherapy. EXAM: CT ANGIOGRAPHY CHEST CT ABDOMEN AND PELVIS WITH CONTRAST TECHNIQUE: Multidetector CT imaging of the chest was performed using the standard protocol during bolus administration of intravenous contrast. Multiplanar CT image reconstructions and MIPs were obtained to evaluate the vascular anatomy. Multidetector CT imaging of the abdomen and pelvis was performed using the standard protocol during bolus administration of intravenous contrast. RADIATION DOSE REDUCTION: This exam was performed according to the departmental dose-optimization program which includes automated exposure control, adjustment of the mA and/or kV according to patient size and/or use of iterative reconstruction technique. CONTRAST:  OMNIPAQUE IOHEXOL 350 MG/ML SOLN COMPARISON:  Chest radiograph dated 10/10/2023. CT abdomen pelvis dated 08/29/2023. FINDINGS: CTA CHEST FINDINGS Cardiovascular: There is no cardiomegaly or pericardial effusion. Three-vessel coronary vascular calcification. Right-sided Port-A-Cath with tip at the cavoatrial junction. Mild atherosclerotic calcification of the thoracic aorta. No aneurysmal dilatation or dissection. The origins of the great vessels of the aortic arch appear patent. No pulmonary artery embolus identified. Mediastinum/Nodes: Top-normal left hilar lymph nodes measure up to 1 cm. Posterior mediastinal adenopathy measure approximately 2 cm in short axis. Top-normal lymph node to the left of the trachea at the level of the thoracic inlet measures 9 mm short axis. Rounded left axillary and subpectoral lymph nodes measure up to 9 mm. The esophagus is grossly unremarkable. No mediastinal fluid collection. Lungs/Pleura: Faint diffuse hazy density throughout the lungs, likely atelectasis. No consolidative changes. There is no pleural  effusion or pneumothorax. The central airways are patent. Musculoskeletal: Degenerative changes of the spine. No acute osseous pathology. Review of the MIP images confirms the above findings. CT ABDOMEN and PELVIS FINDINGS No intra-abdominal free air.  Small ascites. Hepatobiliary: The liver is unremarkable. No biliary dilatation. The gallbladder is unremarkable. Pancreas: Unremarkable. No pancreatic ductal dilatation or surrounding inflammatory changes. Spleen: Normal in size without focal abnormality. Adrenals/Urinary Tract: The adrenal glands are unremarkable. There is mild bilateral hydronephrosis relatively similar to prior CT. The visualized ureters appear unremarkable. The urinary bladder is minimally distended. Stomach/Bowel: Mildly dilated fluid-filled loops of small bowel suggestive of enteritis. Gradual transition likely represents a degree of ileus. No definite obstruction. Mild sigmoid diverticulosis. There is loose stool throughout the colon consistent with diarrheal state. The appendix is unremarkable. Vascular/Lymphatic: Mild aortoiliac atherosclerotic disease. The IVC is unremarkable. No portal venous gas. Retroperitoneal adenopathy relatively similar to prior CT. Reproductive: The uterus is anteverted. Air is noted in the vagina. The ovaries are grossly unremarkable. Other: There is slight progression of omental implants and nodularity since the prior CT. Musculoskeletal: Diffuse subcutaneous edema of the pelvic wall. L1 compression fracture with greater than 50% loss of vertebral body height, new since the prior CT, likely acute or subacute. There is approximately 4 mm retropulsion of the inferior posterior cortex. There is disc desiccation and vacuum phenomena at L1-L2. Review of the MIP images confirms the above findings. IMPRESSION: 1. No acute intrathoracic pathology. No CT evidence of pulmonary artery embolus. 2. Probable enteritis with associated ileus and diarrheal state. Clinical  correlation is recommended. 3. Mild bilateral hydronephrosis relatively similar to prior CT. 4. Slight progression of omental implants and nodularity since the prior CT. 5. L1 compression fracture, new since the prior CT, likely acute or subacute. 6.  Aortic Atherosclerosis (ICD10-I70.0). Electronically Signed   By: Elgie Collard M.D.   On: 10/10/2023 20:02   DG Chest 2 View Result Date: 10/10/2023 CLINICAL DATA:  Shortness of breath EXAM: CHEST - 2 VIEW COMPARISON:  Chest radiograph dated 10/02/2023, CT abdomen and pelvis dated 08/29/2023 FINDINGS: Lines/tubes: Right chest wall port tip projects over the superior cavoatrial junction. Lungs: Well inflated lungs. Patchy opacity projecting over the left costophrenic angle. Pleura: No pneumothorax or pleural effusion. Heart/mediastinum: The heart size and mediastinal contours are within normal limits. Bones: Compression deformity of approximately L1, new from 08/29/2023. IMPRESSION: 1. Patchy opacity projecting over the left costophrenic angle, which may represent atelectasis or pneumonia. 2. New approximately L1 compression deformity. Electronically Signed   By: Agustin Cree M.D.   On: 10/10/2023 15:45    Anti-infectives: Anti-infectives (From admission, onward)    Start     Dose/Rate Route Frequency Ordered Stop   10/12/23 1000  vancomycin (VANCOCIN) capsule 125 mg        125 mg Oral 4 times daily 10/12/23 0902 10/21/23 0959   10/11/23 2200  cefTRIAXone (ROCEPHIN) 2 g in sodium chloride 0.9 % 100 mL IVPB        2 g 200 mL/hr over 30 Minutes Intravenous Every 24 hours 10/11/23 1514     10/11/23 1600  vancomycin (VANCOREADY) IVPB 1250 mg/250 mL  Status:  Discontinued       Placed in "Followed by" Linked Group   1,250 mg 166.7 mL/hr over 90 Minutes Intravenous Every 24 hours 10/10/23 1536 10/10/23 1613   10/11/23 1000  vancomycin (VANCOCIN) capsule 125 mg  Status:  Discontinued        125 mg Oral 4 times daily 10/11/23 0714 10/11/23 0714   10/11/23  1000  vancomycin (VANCOCIN) capsule 125 mg  Status:  Discontinued        125 mg Oral 4 times daily 10/11/23 0718 10/11/23 1513  10/11/23 0715  vancomycin (VANCOCIN) capsule 125 mg  Status:  Discontinued        125 mg Oral Every 6 hours 10/11/23 0714 10/11/23 0718   10/10/23 2200  ceFEPIme (MAXIPIME) 2 g in sodium chloride 0.9 % 100 mL IVPB  Status:  Discontinued        2 g 200 mL/hr over 30 Minutes Intravenous Every 8 hours 10/10/23 1536 10/11/23 1513   10/10/23 1600  metroNIDAZOLE (FLAGYL) IVPB 500 mg  Status:  Discontinued        500 mg 100 mL/hr over 60 Minutes Intravenous Every 12 hours 10/10/23 1513 10/10/23 1611   10/10/23 1600  vancomycin (VANCOREADY) IVPB 1500 mg/300 mL  Status:  Discontinued       Placed in "Followed by" Linked Group   1,500 mg 150 mL/hr over 120 Minutes Intravenous  Once 10/10/23 1536 10/10/23 1613   10/10/23 1445  vancomycin (VANCOCIN) IVPB 1000 mg/200 mL premix        1,000 mg 200 mL/hr over 60 Minutes Intravenous  Once 10/10/23 1431 10/10/23 1618   10/10/23 1445  ceFEPIme (MAXIPIME) 2 g in sodium chloride 0.9 % 100 mL IVPB        2 g 200 mL/hr over 30 Minutes Intravenous  Once 10/10/23 1431 10/10/23 1514       Assessment/Plan:  The patient is a 58 year old female who we are consulted on given some dilated and fluid-filled loops of bowel with stage IV endometrial cancer.  She is not completely obstructed and she is tolerating clear liquid diet.  She can advance to full liquid diet today.  I discussed with her about advancing diet as tolerated but she says she prefers a liquid diet as it is hard for her to chew.  CT scan changes show advancement of omental implants and while there are dilated loops of small bowel and her colon she is having bowel movements.  At this time there is no role for any surgical intervention.  Recommending advancing diet as tolerated and supplementing her with protein shakes.  Surgery will sign off for now please contact us with any  questions or concerns  Baker Pierini, M.D. [facility]

## 2023-10-12 NOTE — Progress Notes (Signed)
 PROGRESS NOTE    Tonya [name]  EAV:[id number] DOB: 11-16-1965 DOA: 10/10/2023 PCP: Danella Penton, MD   Assessment & Plan:   Active Problems:   Severe sepsis (HCC)   C. difficile colitis   Endometrial carcinoma (HCC)   Hypothyroidism (acquired)   Ischemic cardiomyopathy   Essential hypertension   Diabetes mellitus (HCC)   Coronary artery disease involving native coronary artery of native heart with angina pectoris (HCC)   Hyponatremia   Thrombocytopenia (HCC)   Closed compression fracture of body of L1 vertebra (HCC)  Assessment and Plan: Severe sepsis: met criteria w/ tachycardia, leukocytosis & bacteremia & c. Diff colitis. Continue on po vanco & IV rocephin as per ID    C. difficile colitis: on po vanco on 10/20/23 as per ID \  Endometrial carcinoma: stage IV. Poor prognosis as per onco. .   Closed compression fracture: of body of L1 vertebra. PT/OT recs HH    Thrombocytopenia: possibly secondary to recent chemo. Will continue to monitor    Bacteremia: secondary to streptococcus, possibly gut translocation. Continue on IV rocephin while inpatient and d/c on amoxicillin as per ID.    Hyponatremia: trending up. Will continue to monitor    Hx of CAD: continue on metoprolol    DM2: well controlled, HbA1c 6.5. Continue on SSI w/ accuchecks    HTN: continue on metoprolol  Ischemic cardiomyopathy: echo showed 07/2019 w/ EF 45-50%. Continue on metoprolol     Hypothyroidism: continue on armour thyroid   Morbid obesity: BMI 45.4. Complicates overall care & prognosis      DVT prophylaxis: SCDs Code Status: full  Family Communication:  Disposition Plan: likely d/c home   Level of care: Telemetry Medical  Status is: Inpatient Remains inpatient appropriate because: severity of illness, requiring IV abxs    Consultants:    Procedures:  Antimicrobials: on rocephin, po vanco    Subjective: Pt c/o generalized weakness  Objective: Vitals:   10/11/23 2049  10/11/23 2127 10/12/23 0247 10/12/23 0806  BP: 132/70 132/70 131/74 135/71  Pulse:  100 (!) 101 99  Resp: 18  16 20   Temp: 98.2 F (36.8 C)  99 F (37.2 C) 98.6 F (37 C)  TempSrc:      SpO2: 98%  95% 96%  Weight:      Height:        Intake/Output Summary (Last 24 hours) at 10/12/2023 0848 Last data filed at 10/12/2023 0300 Gross per 24 hour  Intake 220 ml  Output --  Net 220 ml   Filed Weights   10/10/23 0948  Weight: 120.2 kg    Examination:  General exam: Appears calm and comfortable  Respiratory system: Clear to auscultation. Respiratory effort normal. Cardiovascular system: S1 & S2+. No rubs, gallops or clicks.  Gastrointestinal system: Abdomen is obese, soft and nontender. Normal bowel sounds heard. Central nervous system: Alert and oriented. Moves all extremities  Psychiatry: Judgement and insight appear normal. Mood & affect appropriate.     Data Reviewed: I have personally reviewed following labs and imaging studies  CBC: Recent Labs  Lab 10/06/23 0428 10/10/23 1004 10/11/23 0625 10/12/23 0514  WBC 0.2* 22.3* 24.4* 30.0*  HGB 10.8* 13.2 11.5* 10.4*  HCT 30.8* 36.7 32.8* 29.8*  MCV 89.3 86.6 87.2 87.4  PLT 25* 72* 76* 98*   Basic Metabolic Panel: Recent Labs  Lab 10/06/23 0428 10/10/23 1004 10/11/23 0625 10/12/23 0514  NA 128* 131* 134* 134*  K 3.4* 4.0 4.3 3.3*  CL 95* 95*  100 102  CO2 23 18* 23 21*  GLUCOSE 132* 182* 129* 100*  BUN 33* 56* 52* 39*  CREATININE 0.56 1.19* 1.23* 0.91  CALCIUM 8.5* 9.8 9.4 9.1   GFR: Estimated Creatinine Clearance: 85 mL/min (by C-G formula based on SCr of 0.91 mg/dL). Liver Function Tests: Recent Labs  Lab 10/12/23 0514  AST 25  ALT 17  ALKPHOS 172*  BILITOT 1.0  PROT 5.4*  ALBUMIN 2.2*   No results for input(s): "LIPASE", "AMYLASE" in the last 168 hours. No results for input(s): "AMMONIA" in the last 168 hours. Coagulation Profile: No results for input(s): "INR", "PROTIME" in the last 168  hours. Cardiac Enzymes: No results for input(s): "CKTOTAL", "CKMB", "CKMBINDEX", "TROPONINI" in the last 168 hours. BNP (last 3 results) No results for input(s): "PROBNP" in the last 8760 hours. HbA1C: No results for input(s): "HGBA1C" in the last 72 hours. CBG: Recent Labs  Lab 10/11/23 0814 10/11/23 1220 10/11/23 1527 10/11/23 2129 10/12/23 0807  GLUCAP 116* 112* 102* 93 96   Lipid Profile: No results for input(s): "CHOL", "HDL", "LDLCALC", "TRIG", "CHOLHDL", "LDLDIRECT" in the last 72 hours. Thyroid Function Tests: No results for input(s): "TSH", "T4TOTAL", "FREET4", "T3FREE", "THYROIDAB" in the last 72 hours. Anemia Panel: No results for input(s): "VITAMINB12", "FOLATE", "FERRITIN", "TIBC", "IRON", "RETICCTPCT" in the last 72 hours. Sepsis Labs: Recent Labs  Lab 10/10/23 1004 10/10/23 1236 10/10/23 1353 10/12/23 0514  PROCALCITON  --   --  2.36  --   LATICACIDVEN 2.2* 2.5*  --  0.9    Recent Results (from the past 240 hours)  Resp panel by RT-PCR (RSV, Flu A&B, Covid) Anterior Nasal Swab     Status: None   Collection Time: 10/02/23  3:36 PM   Specimen: Anterior Nasal Swab  Result Value Ref Range Status   SARS Coronavirus 2 by RT PCR NEGATIVE NEGATIVE Final    Comment: (NOTE) SARS-CoV-2 target nucleic acids are NOT DETECTED.  The SARS-CoV-2 RNA is generally detectable in upper respiratory specimens during the acute phase of infection. The lowest concentration of SARS-CoV-2 viral copies this assay can detect is 138 copies/mL. A negative result does not preclude SARS-Cov-2 infection and should not be used as the sole basis for treatment or other patient management decisions. A negative result may occur with  improper specimen collection/handling, submission of specimen other than nasopharyngeal swab, presence of viral mutation(s) within the areas targeted by this assay, and inadequate number of viral copies(<138 copies/mL). A negative result must be combined  with clinical observations, patient history, and epidemiological information. The expected result is Negative.  Fact Sheet for Patients:  BloggerCourse.com  Fact Sheet for Healthcare Providers:  SeriousBroker.it  This test is no t yet approved or cleared by the [place] FDA and  has been authorized for detection and/or diagnosis of SARS-CoV-2 by FDA under an Emergency Use Authorization (EUA). This EUA will remain  in effect (meaning this test can be used) for the duration of the COVID-19 declaration under Section 564(b)(1) of the Act, 21 U.S.C.section 360bbb-3(b)(1), unless the authorization is terminated  or revoked sooner.       Influenza A by PCR NEGATIVE NEGATIVE Final   Influenza B by PCR NEGATIVE NEGATIVE Final    Comment: (NOTE) The Xpert Xpress SARS-CoV-2/FLU/RSV plus assay is intended as an aid in the diagnosis of influenza from Nasopharyngeal swab specimens and should not be used as a sole basis for treatment. Nasal washings and aspirates are unacceptable for Xpert Xpress SARS-CoV-2/FLU/RSV testing.  Fact Sheet for Patients: BloggerCourse.com  Fact Sheet for Healthcare Providers: SeriousBroker.it  This test is not yet approved or cleared by the [place] FDA and has been authorized for detection and/or diagnosis of SARS-CoV-2 by FDA under an Emergency Use Authorization (EUA). This EUA will remain in effect (meaning this test can be used) for the duration of the COVID-19 declaration under Section 564(b)(1) of the Act, 21 U.S.C. section 360bbb-3(b)(1), unless the authorization is terminated or revoked.     Resp Syncytial Virus by PCR NEGATIVE NEGATIVE Final    Comment: (NOTE) Fact Sheet for Patients: BloggerCourse.com  Fact Sheet for Healthcare Providers: SeriousBroker.it  This test is not yet approved  or cleared by the [place] FDA and has been authorized for detection and/or diagnosis of SARS-CoV-2 by FDA under an Emergency Use Authorization (EUA). This EUA will remain in effect (meaning this test can be used) for the duration of the COVID-19 declaration under Section 564(b)(1) of the Act, 21 U.S.C. section 360bbb-3(b)(1), unless the authorization is terminated or revoked.  Performed at [facility], [address] [place], [place] [address]   Culture, blood (routine x 2)     Status: None   Collection Time: 10/02/23  3:36 PM   Specimen: BLOOD  Result Value Ref Range Status   Specimen Description BLOOD RIGHT ANTECUBITAL  Final   Special Requests   Final    BOTTLES DRAWN AEROBIC AND ANAEROBIC Blood Culture results may not be optimal due to an inadequate volume of blood received in culture bottles   Culture   Final    NO GROWTH 5 DAYS Performed at [facility], [address] [place], [place] [address]    Report Status 10/07/2023 FINAL  Final  Culture, blood (routine x 2)     Status: Abnormal   Collection Time: 10/02/23  4:13 PM   Specimen: BLOOD LEFT HAND  Result Value Ref Range Status   Specimen Description   Final    BLOOD LEFT HAND Performed at [facility], [address], [place], [place] [address]    Special Requests   Final    BOTTLES DRAWN AEROBIC AND ANAEROBIC Blood Culture results may not be optimal due to an inadequate volume of blood received in culture bottles Performed at [facility], [address] [place], [place] [address]    Culture  Setup Time   Final    GRAM POSITIVE COCCI ANAEROBIC BOTTLE ONLY CRITICAL RESULT CALLED TO, READ BACK BY AND VERIFIED WITH: MERRILL, K. 0944 10/03/2023 [facility] AEROBIC BOTTLE ONLY GRAM POSITIVE RODS CRITICAL RESULT CALLED TO, READ BACK BY AND VERIFIED WITH:  Mila Merry AT 0004 10/06/23 JG    Culture (A)  Final    VIRIDANS STREPTOCOCCUS ACTINOMYCES NAESLUNDII Standardized  susceptibility testing for this organism is not available. Performed at [facility], [address] [place], [place] [address]    Report Status 10/08/2023 FINAL  Final  Blood Culture ID Panel (Reflexed)     Status: Abnormal   Collection Time: 10/02/23  4:13 PM  Result Value Ref Range Status   Enterococcus faecalis NOT DETECTED NOT DETECTED Final   Enterococcus Faecium NOT DETECTED NOT DETECTED Final   Listeria monocytogenes NOT DETECTED NOT DETECTED Final   Staphylococcus species NOT DETECTED NOT DETECTED Final   Staphylococcus aureus (BCID) NOT DETECTED NOT DETECTED Final   Staphylococcus epidermidis NOT DETECTED NOT DETECTED Final   Staphylococcus lugdunensis NOT DETECTED NOT DETECTED Final   Streptococcus species DETECTED (  A) NOT DETECTED Final    Comment: Not Enterococcus species, Streptococcus agalactiae, Streptococcus pyogenes, or Streptococcus pneumoniae. CRITICAL RESULT CALLED TO, READ BACK BY AND VERIFIED WITH: MERRILL, K. 0944 10/03/2023 [facility]    Streptococcus agalactiae NOT DETECTED NOT DETECTED Final   Streptococcus pneumoniae NOT DETECTED NOT DETECTED Final   Streptococcus pyogenes NOT DETECTED NOT DETECTED Final   A.calcoaceticus-baumannii NOT DETECTED NOT DETECTED Final   Bacteroides fragilis NOT DETECTED NOT DETECTED Final   Enterobacterales NOT DETECTED NOT DETECTED Final   Enterobacter cloacae complex NOT DETECTED NOT DETECTED Final   Escherichia coli NOT DETECTED NOT DETECTED Final   Klebsiella aerogenes NOT DETECTED NOT DETECTED Final   Klebsiella oxytoca NOT DETECTED NOT DETECTED Final   Klebsiella pneumoniae NOT DETECTED NOT DETECTED Final   Proteus species NOT DETECTED NOT DETECTED Final   Salmonella species NOT DETECTED NOT DETECTED Final   Serratia marcescens NOT DETECTED NOT DETECTED Final   Haemophilus influenzae NOT DETECTED NOT DETECTED Final   Neisseria meningitidis NOT DETECTED NOT DETECTED Final   Pseudomonas aeruginosa NOT DETECTED NOT  DETECTED Final   Stenotrophomonas maltophilia NOT DETECTED NOT DETECTED Final   Candida albicans NOT DETECTED NOT DETECTED Final   Candida auris NOT DETECTED NOT DETECTED Final   Candida glabrata NOT DETECTED NOT DETECTED Final   Candida krusei NOT DETECTED NOT DETECTED Final   Candida parapsilosis NOT DETECTED NOT DETECTED Final   Candida tropicalis NOT DETECTED NOT DETECTED Final   Cryptococcus neoformans/gattii NOT DETECTED NOT DETECTED Final    Comment: Performed at [facility], [address] [place], [place] [address]  Gastrointestinal Panel by PCR , Stool     Status: None   Collection Time: 10/02/23  5:54 PM   Specimen: Stool  Result Value Ref Range Status   Campylobacter species NOT DETECTED NOT DETECTED Final   Plesimonas shigelloides NOT DETECTED NOT DETECTED Final   Salmonella species NOT DETECTED NOT DETECTED Final   Yersinia enterocolitica NOT DETECTED NOT DETECTED Final   Vibrio species NOT DETECTED NOT DETECTED Final   Vibrio cholerae NOT DETECTED NOT DETECTED Final   Enteroaggregative E coli (EAEC) NOT DETECTED NOT DETECTED Final   Enteropathogenic E coli (EPEC) NOT DETECTED NOT DETECTED Final   Enterotoxigenic E coli (ETEC) NOT DETECTED NOT DETECTED Final   Shiga like toxin producing E coli (STEC) NOT DETECTED NOT DETECTED Final   Shigella/Enteroinvasive E coli (EIEC) NOT DETECTED NOT DETECTED Final   Cryptosporidium NOT DETECTED NOT DETECTED Final   Cyclospora cayetanensis NOT DETECTED NOT DETECTED Final   Entamoeba histolytica NOT DETECTED NOT DETECTED Final   Giardia lamblia NOT DETECTED NOT DETECTED Final   Adenovirus F40/41 NOT DETECTED NOT DETECTED Final   Astrovirus NOT DETECTED NOT DETECTED Final   Norovirus GI/GII NOT DETECTED NOT DETECTED Final   Rotavirus A NOT DETECTED NOT DETECTED Final   Sapovirus (I, II, IV, and V) NOT DETECTED NOT DETECTED Final    Comment: Performed at [facility], [address] [place], [place] [address]   C Difficile Quick Screen w PCR reflex     Status: Abnormal   Collection Time: 10/02/23  5:54 PM   Specimen: Stool  Result Value Ref Range Status   C Diff antigen POSITIVE (A) NEGATIVE Final   C Diff toxin NEGATIVE NEGATIVE Final   C Diff interpretation Results are indeterminate. See PCR results.  Final    Comment: Performed at [facility], [address] [place], [place] [address]  C. Diff by  PCR, Reflexed     Status: Abnormal   Collection Time: 10/02/23  5:54 PM  Result Value Ref Range Status   Toxigenic C. Difficile by PCR POSITIVE (A) NEGATIVE Final    Comment: Positive for toxigenic C. difficile with little to no toxin production. Only treat if clinical presentation suggests symptomatic illness. CRITICAL RESULT CALLED TO, READ BACK BY AND VERIFIED WITH: PAM RYAN RN @ 2032 10/02/23 [facility] Performed at [facility], [address] [place], [place] [address]   Culture, blood (Routine X 2) w Reflex to ID Panel     Status: None   Collection Time: 10/05/23  6:41 AM   Specimen: BLOOD  Result Value Ref Range Status   Specimen Description BLOOD BLOOD LEFT ARM  Final   Special Requests   Final    BOTTLES DRAWN AEROBIC AND ANAEROBIC Blood Culture adequate volume   Culture   Final    NO GROWTH 5 DAYS Performed at [facility], [address] [place], [place] [address]    Report Status 10/10/2023 FINAL  Final  Culture, blood (Routine X 2) w Reflex to ID Panel     Status: None   Collection Time: 10/05/23  6:46 AM   Specimen: BLOOD  Result Value Ref Range Status   Specimen Description BLOOD BLOOD RIGHT HAND  Final   Special Requests   Final    BOTTLES DRAWN AEROBIC AND ANAEROBIC Blood Culture adequate volume   Culture   Final    NO GROWTH 5 DAYS Performed at [facility], [address] [place], [place] [address]    Report Status 10/10/2023 FINAL  Final  Resp panel by RT-PCR (RSV, Flu A&B, Covid) Anterior Nasal Swab     Status: None    Collection Time: 10/10/23 12:36 PM   Specimen: Anterior Nasal Swab  Result Value Ref Range Status   SARS Coronavirus 2 by RT PCR NEGATIVE NEGATIVE Final    Comment: (NOTE) SARS-CoV-2 target nucleic acids are NOT DETECTED.  The SARS-CoV-2 RNA is generally detectable in upper respiratory specimens during the acute phase of infection. The lowest concentration of SARS-CoV-2 viral copies this assay can detect is 138 copies/mL. A negative result does not preclude SARS-Cov-2 infection and should not be used as the sole basis for treatment or other patient management decisions. A negative result may occur with  improper specimen collection/handling, submission of specimen other than nasopharyngeal swab, presence of viral mutation(s) within the areas targeted by this assay, and inadequate number of viral copies(<138 copies/mL). A negative result must be combined with clinical observations, patient history, and epidemiological information. The expected result is Negative.  Fact Sheet for Patients:  BloggerCourse.com  Fact Sheet for Healthcare Providers:  SeriousBroker.it  This test is no t yet approved or cleared by the [place] FDA and  has been authorized for detection and/or diagnosis of SARS-CoV-2 by FDA under an Emergency Use Authorization (EUA). This EUA will remain  in effect (meaning this test can be used) for the duration of the COVID-19 declaration under Section 564(b)(1) of the Act, 21 U.S.C.section 360bbb-3(b)(1), unless the authorization is terminated  or revoked sooner.       Influenza A by PCR NEGATIVE NEGATIVE Final   Influenza B by PCR NEGATIVE NEGATIVE Final    Comment: (NOTE) The Xpert Xpress SARS-CoV-2/FLU/RSV plus assay is intended as an aid in the diagnosis of influenza from Nasopharyngeal swab specimens and should not be used as a sole basis for treatment. Nasal washings and  aspirates are unacceptable for  Xpert Xpress SARS-CoV-2/FLU/RSV testing.  Fact Sheet for Patients: BloggerCourse.com  Fact Sheet for Healthcare Providers: SeriousBroker.it  This test is not yet approved or cleared by the [place] FDA and has been authorized for detection and/or diagnosis of SARS-CoV-2 by FDA under an Emergency Use Authorization (EUA). This EUA will remain in effect (meaning this test can be used) for the duration of the COVID-19 declaration under Section 564(b)(1) of the Act, 21 U.S.C. section 360bbb-3(b)(1), unless the authorization is terminated or revoked.     Resp Syncytial Virus by PCR NEGATIVE NEGATIVE Final    Comment: (NOTE) Fact Sheet for Patients: BloggerCourse.com  Fact Sheet for Healthcare Providers: SeriousBroker.it  This test is not yet approved or cleared by the [place] FDA and has been authorized for detection and/or diagnosis of SARS-CoV-2 by FDA under an Emergency Use Authorization (EUA). This EUA will remain in effect (meaning this test can be used) for the duration of the COVID-19 declaration under Section 564(b)(1) of the Act, 21 U.S.C. section 360bbb-3(b)(1), unless the authorization is terminated or revoked.  Performed at [facility], [address] [place], [place] [address]   Respiratory (~20 pathogens) panel by PCR     Status: None   Collection Time: 10/10/23  4:21 PM   Specimen: Nasopharyngeal Swab; Respiratory  Result Value Ref Range Status   Adenovirus NOT DETECTED NOT DETECTED Final   Coronavirus 229E NOT DETECTED NOT DETECTED Final    Comment: (NOTE) The Coronavirus on the Respiratory Panel, DOES NOT test for the novel  Coronavirus (2019 nCoV)    Coronavirus HKU1 NOT DETECTED NOT DETECTED Final   Coronavirus NL63 NOT DETECTED NOT DETECTED Final   Coronavirus OC43 NOT DETECTED NOT DETECTED Final   Metapneumovirus NOT DETECTED NOT  DETECTED Final   Rhinovirus / Enterovirus NOT DETECTED NOT DETECTED Final   Influenza A NOT DETECTED NOT DETECTED Final   Influenza B NOT DETECTED NOT DETECTED Final   Parainfluenza Virus 1 NOT DETECTED NOT DETECTED Final   Parainfluenza Virus 2 NOT DETECTED NOT DETECTED Final   Parainfluenza Virus 3 NOT DETECTED NOT DETECTED Final   Parainfluenza Virus 4 NOT DETECTED NOT DETECTED Final   Respiratory Syncytial Virus NOT DETECTED NOT DETECTED Final   Bordetella pertussis NOT DETECTED NOT DETECTED Final   Bordetella Parapertussis NOT DETECTED NOT DETECTED Final   Chlamydophila pneumoniae NOT DETECTED NOT DETECTED Final   Mycoplasma pneumoniae NOT DETECTED NOT DETECTED Final    Comment: Performed at [facility], [address] [place], [place] [address]         Radiology Studies: CT Angio Chest Pulmonary Embolism (PE) W or WO Contrast Result Date: 10/10/2023 CLINICAL DATA:  Chest and abdominal pain. Concern for pulmonary embolism. Shortness of breath. History of metastatic disease for cervical cancer and chemotherapy. EXAM: CT ANGIOGRAPHY CHEST CT ABDOMEN AND PELVIS WITH CONTRAST TECHNIQUE: Multidetector CT imaging of the chest was performed using the standard protocol during bolus administration of intravenous contrast. Multiplanar CT image reconstructions and MIPs were obtained to evaluate the vascular anatomy. Multidetector CT imaging of the abdomen and pelvis was performed using the standard protocol during bolus administration of intravenous contrast. RADIATION DOSE REDUCTION: This exam was performed according to the departmental dose-optimization program which includes automated exposure control, adjustment of the mA and/or kV according to patient size and/or use of iterative reconstruction technique. CONTRAST:  OMNIPAQUE IOHEXOL 350 MG/ML SOLN COMPARISON:  Chest radiograph dated 10/10/2023. CT abdomen pelvis dated  08/29/2023. FINDINGS: CTA CHEST FINDINGS Cardiovascular: There  is no cardiomegaly or pericardial effusion. Three-vessel coronary vascular calcification. Right-sided Port-A-Cath with tip at the cavoatrial junction. Mild atherosclerotic calcification of the thoracic aorta. No aneurysmal dilatation or dissection. The origins of the great vessels of the aortic arch appear patent. No pulmonary artery embolus identified. Mediastinum/Nodes: Top-normal left hilar lymph nodes measure up to 1 cm. Posterior mediastinal adenopathy measure approximately 2 cm in short axis. Top-normal lymph node to the left of the trachea at the level of the thoracic inlet measures 9 mm short axis. Rounded left axillary and subpectoral lymph nodes measure up to 9 mm. The esophagus is grossly unremarkable. No mediastinal fluid collection. Lungs/Pleura: Faint diffuse hazy density throughout the lungs, likely atelectasis. No consolidative changes. There is no pleural effusion or pneumothorax. The central airways are patent. Musculoskeletal: Degenerative changes of the spine. No acute osseous pathology. Review of the MIP images confirms the above findings. CT ABDOMEN and PELVIS FINDINGS No intra-abdominal free air.  Small ascites. Hepatobiliary: The liver is unremarkable. No biliary dilatation. The gallbladder is unremarkable. Pancreas: Unremarkable. No pancreatic ductal dilatation or surrounding inflammatory changes. Spleen: Normal in size without focal abnormality. Adrenals/Urinary Tract: The adrenal glands are unremarkable. There is mild bilateral hydronephrosis relatively similar to prior CT. The visualized ureters appear unremarkable. The urinary bladder is minimally distended. Stomach/Bowel: Mildly dilated fluid-filled loops of small bowel suggestive of enteritis. Gradual transition likely represents a degree of ileus. No definite obstruction. Mild sigmoid diverticulosis. There is loose stool throughout the colon consistent with diarrheal state. The appendix is unremarkable. Vascular/Lymphatic: Mild  aortoiliac atherosclerotic disease. The IVC is unremarkable. No portal venous gas. Retroperitoneal adenopathy relatively similar to prior CT. Reproductive: The uterus is anteverted. Air is noted in the vagina. The ovaries are grossly unremarkable. Other: There is slight progression of omental implants and nodularity since the prior CT. Musculoskeletal: Diffuse subcutaneous edema of the pelvic wall. L1 compression fracture with greater than 50% loss of vertebral body height, new since the prior CT, likely acute or subacute. There is approximately 4 mm retropulsion of the inferior posterior cortex. There is disc desiccation and vacuum phenomena at L1-L2. Review of the MIP images confirms the above findings. IMPRESSION: 1. No acute intrathoracic pathology. No CT evidence of pulmonary artery embolus. 2. Probable enteritis with associated ileus and diarrheal state. Clinical correlation is recommended. 3. Mild bilateral hydronephrosis relatively similar to prior CT. 4. Slight progression of omental implants and nodularity since the prior CT. 5. L1 compression fracture, new since the prior CT, likely acute or subacute. 6.  Aortic Atherosclerosis (ICD10-I70.0). Electronically Signed   By: Elgie Collard M.D.   On: 10/10/2023 20:02   CT ABDOMEN PELVIS W CONTRAST Result Date: 10/10/2023 CLINICAL DATA:  Chest and abdominal pain. Concern for pulmonary embolism. Shortness of breath. History of metastatic disease for cervical cancer and chemotherapy. EXAM: CT ANGIOGRAPHY CHEST CT ABDOMEN AND PELVIS WITH CONTRAST TECHNIQUE: Multidetector CT imaging of the chest was performed using the standard protocol during bolus administration of intravenous contrast. Multiplanar CT image reconstructions and MIPs were obtained to evaluate the vascular anatomy. Multidetector CT imaging of the abdomen and pelvis was performed using the standard protocol during bolus administration of intravenous contrast. RADIATION DOSE REDUCTION: This exam  was performed according to the departmental dose-optimization program which includes automated exposure control, adjustment of the mA and/or kV according to patient size and/or use of iterative reconstruction technique. CONTRAST:  OMNIPAQUE IOHEXOL 350 MG/ML SOLN COMPARISON:  Chest  radiograph dated 10/10/2023. CT abdomen pelvis dated 08/29/2023. FINDINGS: CTA CHEST FINDINGS Cardiovascular: There is no cardiomegaly or pericardial effusion. Three-vessel coronary vascular calcification. Right-sided Port-A-Cath with tip at the cavoatrial junction. Mild atherosclerotic calcification of the thoracic aorta. No aneurysmal dilatation or dissection. The origins of the great vessels of the aortic arch appear patent. No pulmonary artery embolus identified. Mediastinum/Nodes: Top-normal left hilar lymph nodes measure up to 1 cm. Posterior mediastinal adenopathy measure approximately 2 cm in short axis. Top-normal lymph node to the left of the trachea at the level of the thoracic inlet measures 9 mm short axis. Rounded left axillary and subpectoral lymph nodes measure up to 9 mm. The esophagus is grossly unremarkable. No mediastinal fluid collection. Lungs/Pleura: Faint diffuse hazy density throughout the lungs, likely atelectasis. No consolidative changes. There is no pleural effusion or pneumothorax. The central airways are patent. Musculoskeletal: Degenerative changes of the spine. No acute osseous pathology. Review of the MIP images confirms the above findings. CT ABDOMEN and PELVIS FINDINGS No intra-abdominal free air.  Small ascites. Hepatobiliary: The liver is unremarkable. No biliary dilatation. The gallbladder is unremarkable. Pancreas: Unremarkable. No pancreatic ductal dilatation or surrounding inflammatory changes. Spleen: Normal in size without focal abnormality. Adrenals/Urinary Tract: The adrenal glands are unremarkable. There is mild bilateral hydronephrosis relatively similar to prior CT. The visualized  ureters appear unremarkable. The urinary bladder is minimally distended. Stomach/Bowel: Mildly dilated fluid-filled loops of small bowel suggestive of enteritis. Gradual transition likely represents a degree of ileus. No definite obstruction. Mild sigmoid diverticulosis. There is loose stool throughout the colon consistent with diarrheal state. The appendix is unremarkable. Vascular/Lymphatic: Mild aortoiliac atherosclerotic disease. The IVC is unremarkable. No portal venous gas. Retroperitoneal adenopathy relatively similar to prior CT. Reproductive: The uterus is anteverted. Air is noted in the vagina. The ovaries are grossly unremarkable. Other: There is slight progression of omental implants and nodularity since the prior CT. Musculoskeletal: Diffuse subcutaneous edema of the pelvic wall. L1 compression fracture with greater than 50% loss of vertebral body height, new since the prior CT, likely acute or subacute. There is approximately 4 mm retropulsion of the inferior posterior cortex. There is disc desiccation and vacuum phenomena at L1-L2. Review of the MIP images confirms the above findings. IMPRESSION: 1. No acute intrathoracic pathology. No CT evidence of pulmonary artery embolus. 2. Probable enteritis with associated ileus and diarrheal state. Clinical correlation is recommended. 3. Mild bilateral hydronephrosis relatively similar to prior CT. 4. Slight progression of omental implants and nodularity since the prior CT. 5. L1 compression fracture, new since the prior CT, likely acute or subacute. 6.  Aortic Atherosclerosis (ICD10-I70.0). Electronically Signed   By: Elgie Collard M.D.   On: 10/10/2023 20:02   DG Chest 2 View Result Date: 10/10/2023 CLINICAL DATA:  Shortness of breath EXAM: CHEST - 2 VIEW COMPARISON:  Chest radiograph dated 10/02/2023, CT abdomen and pelvis dated 08/29/2023 FINDINGS: Lines/tubes: Right chest wall port tip projects over the superior cavoatrial junction. Lungs: Well  inflated lungs. Patchy opacity projecting over the left costophrenic angle. Pleura: No pneumothorax or pleural effusion. Heart/mediastinum: The heart size and mediastinal contours are within normal limits. Bones: Compression deformity of approximately L1, new from 08/29/2023. IMPRESSION: 1. Patchy opacity projecting over the left costophrenic angle, which may represent atelectasis or pneumonia. 2. New approximately L1 compression deformity. Electronically Signed   By: Agustin Cree M.D.   On: 10/10/2023 15:45        Scheduled Meds:  feeding supplement  1 Container  Oral TID BM   insulin aspart  0-9 Units Subcutaneous TID WC   metoprolol tartrate  12.5 mg Oral BID   thyroid  60 mg Oral QAC breakfast   Continuous Infusions:  cefTRIAXone (ROCEPHIN)  IV 2 g (10/11/23 2130)     LOS: 2 days      Charise Killian, MD [facility] Pager [phone]  If 7PM-7AM, please contact night-coverage [email or web address] 10/12/2023, 8:48 AM

## 2023-10-12 NOTE — Evaluation (Signed)
 Physical Therapy Evaluation Patient Details Name: Joeline Freer MRN: [id number] DOB: [birth year] Today's Date: 10/12/2023  History of Present Illness  Pt is a 58 y.o. female presenting with sepsis, pneumonia and recent C-diff 2/9-2/13. CT findings: acute or subacute L1 compression fracture and probable enteritis with associated ileus and diarrheal state. PMH includes obesity, endometrial cancer-recently started on chemotherapy, hypertension, SIADH, hypothyroidism, chronic HFpEF, recent diagnosis of C. difficile as well as strep bacteremia.   Clinical Impression  Patient received in bed, she is agreeable to PT session. Reports she has had loose bowels and is afraid to move. She is mod I with bed mobility. Transferred to [facility] with supervision, then ambulated to recliner ~4 feet with RW and cga. Patient slow, reports fatigue/weakness with mobility. She will continue to benefit from skilled PT to improve strength and independence with mobility.           If plan is discharge home, recommend the following: A little help with walking and/or transfers;A little help with bathing/dressing/bathroom;Help with stairs or ramp for entrance;Assist for transportation   Can travel by private vehicle    yes    Equipment Recommendations None recommended by PT  Recommendations for Other Services       Functional Status Assessment Patient has had a recent decline in their functional status and demonstrates the ability to make significant improvements in function in a reasonable and predictable amount of time.     Precautions / Restrictions Precautions Precautions: Fall Precaution/Restrictions Comments: new L1 fx-back precautions educated although no formal consult in Restrictions Weight Bearing Restrictions Per Provider Order: No      Mobility  Bed Mobility Overal bed mobility: Modified Independent Bed Mobility: Supine to Sit     Supine to sit: Modified independent (Device/Increase time)           Transfers Overall transfer level: Needs assistance Equipment used: Rolling walker (2 wheels) Transfers: Sit to/from Stand, Bed to chair/wheelchair/BSC Sit to Stand: Supervision   Step pivot transfers: Supervision       General transfer comment: Cues for hand placement    Ambulation/Gait Ambulation/Gait assistance: Supervision Gait Distance (Feet): 4 Feet Assistive device: Rolling walker (2 wheels) Gait Pattern/deviations: Step-through pattern, Decreased step length - right, Decreased step length - left Gait velocity: decr     General Gait Details: patient reports feeling weak, slightly light headed with mobility  Stairs            Wheelchair Mobility     Tilt Bed    Modified Rankin (Stroke Patients Only)       Balance Overall balance assessment: Modified Independent Sitting-balance support: Feet supported Sitting balance-Leahy Scale: Normal     Standing balance support: Bilateral upper extremity supported, During functional activity, Reliant on assistive device for balance Standing balance-Leahy Scale: Good                               Pertinent Vitals/Pain Pain Assessment Pain Assessment: Faces Faces Pain Scale: Hurts a little bit Pain Location: back Pain Descriptors / Indicators: Discomfort Pain Intervention(s): Monitored during session, Repositioned    Home Living Family/patient expects to be discharged to:: Private residence Living Arrangements: Spouse/significant other Available Help at Discharge: Family;Available PRN/intermittently Type of Home: House Home Access: Stairs to enter Entrance Stairs-Rails: None Entrance Stairs-Number of Steps: 3-4   Home Layout: One level Home Equipment: Pharmacist, hospital (2 wheels) Additional Comments: sleeps in a recliner    Prior  Function Prior Level of Function : Independent/Modified Independent;Driving;Working/employed             Mobility Comments: no AD use up until this  past weekend; 1 fall a few weeks ago ADLs Comments: MOD I/IND; hasn't been able to sponge bathe much d/t increased weakness/SOB since starting cancer treatments     Extremity/Trunk Assessment   Upper Extremity Assessment Upper Extremity Assessment: Overall WFL for tasks assessed    Lower Extremity Assessment Lower Extremity Assessment: Generalized weakness    Cervical / Trunk Assessment Cervical / Trunk Assessment: Normal  Communication   Communication Communication: No apparent difficulties    Cognition Arousal: Alert Behavior During Therapy: WFL for tasks assessed/performed   PT - Cognitive impairments: No apparent impairments                         Following commands: Intact       Cueing Cueing Techniques: Verbal cues     General Comments      Exercises Other Exercises Other Exercises: Instructed patient in LE seated exercises to perform on her own to include: LAQ, marching, hip abd/add   Assessment/Plan    PT Assessment Patient needs continued PT services  PT Problem List Decreased strength;Decreased activity tolerance;Decreased mobility;Pain       PT Treatment Interventions DME instruction;Gait training;Stair training;Functional mobility training;Therapeutic activities;Therapeutic exercise;Patient/family education    PT Goals (Current goals can be found in the Care Plan section)  Acute Rehab PT Goals Patient Stated Goal: improve strength PT Goal Formulation: With patient Time For Goal Achievement: 10/21/23 Potential to Achieve Goals: Good    Frequency Min 1X/week     Co-evaluation               AM-PAC PT "6 Clicks" Mobility  Outcome Measure Help needed turning from your back to your side while in a flat bed without using bedrails?: A Little Help needed moving from lying on your back to sitting on the side of a flat bed without using bedrails?: A Little Help needed moving to and from a bed to a chair (including a wheelchair)?: A  Little Help needed standing up from a chair using your arms (e.g., wheelchair or bedside chair)?: A Little Help needed to walk in hospital room?: A Little Help needed climbing 3-5 steps with a railing? : A Lot 6 Click Score: 17    End of Session   Activity Tolerance: Patient tolerated treatment well Patient left: in chair Nurse Communication: Mobility status PT Visit Diagnosis: Other abnormalities of gait and mobility (R26.89);Muscle weakness (generalized) (M62.81);Difficulty in walking, not elsewhere classified (R26.2)    Time: 8295-6213 PT Time Calculation (min) (ACUTE ONLY): 22 min   Charges:   PT Evaluation $PT Eval Moderate Complexity: 1 Mod PT Treatments $Therapeutic Activity: 8-22 mins PT General Charges $$ ACUTE PT VISIT: 1 Visit         Angy Swearengin, PT, GCS 10/12/23,12:05 PM

## 2023-10-13 ENCOUNTER — Telehealth: Payer: Self-pay | Admitting: *Deleted

## 2023-10-13 ENCOUNTER — Telehealth: Payer: Self-pay | Admitting: Oncology

## 2023-10-13 ENCOUNTER — Other Ambulatory Visit: Payer: Self-pay | Admitting: *Deleted

## 2023-10-13 DIAGNOSIS — E039 Hypothyroidism, unspecified: Secondary | ICD-10-CM | POA: Diagnosis not present

## 2023-10-13 DIAGNOSIS — C541 Malignant neoplasm of endometrium: Secondary | ICD-10-CM | POA: Diagnosis not present

## 2023-10-13 DIAGNOSIS — A0472 Enterocolitis due to Clostridium difficile, not specified as recurrent: Secondary | ICD-10-CM | POA: Diagnosis not present

## 2023-10-13 DIAGNOSIS — S32010A Wedge compression fracture of first lumbar vertebra, initial encounter for closed fracture: Secondary | ICD-10-CM | POA: Diagnosis not present

## 2023-10-13 DIAGNOSIS — C7A8 Other malignant neuroendocrine tumors: Secondary | ICD-10-CM

## 2023-10-13 DIAGNOSIS — D696 Thrombocytopenia, unspecified: Secondary | ICD-10-CM | POA: Diagnosis not present

## 2023-10-13 LAB — GLUCOSE, CAPILLARY
Glucose-Capillary: 115 mg/dL — ABNORMAL HIGH (ref 70–99)
Glucose-Capillary: 116 mg/dL — ABNORMAL HIGH (ref 70–99)
Glucose-Capillary: 117 mg/dL — ABNORMAL HIGH (ref 70–99)
Glucose-Capillary: 109 mg/dL — ABNORMAL HIGH (ref 70–99)

## 2023-10-13 LAB — CBC
HCT: 29.5 % — ABNORMAL LOW (ref 36.0–46.0)
Hemoglobin: 10.3 g/dL — ABNORMAL LOW (ref 12.0–15.0)
MCH: 30.7 pg (ref 26.0–34.0)
MCHC: 34.9 g/dL (ref 30.0–36.0)
MCV: 88.1 fL (ref 80.0–100.0)
Platelets: 140 10*3/uL — ABNORMAL LOW (ref 150–400)
RBC: 3.35 MIL/uL — ABNORMAL LOW (ref 3.87–5.11)
RDW: 15 % (ref 11.5–15.5)
WBC: 34 10*3/uL — ABNORMAL HIGH (ref 4.0–10.5)
nRBC: 0.7 % — ABNORMAL HIGH (ref 0.0–0.2)

## 2023-10-13 LAB — COMPREHENSIVE METABOLIC PANEL
ALT: 16 U/L (ref 0–44)
AST: 26 U/L (ref 15–41)
Albumin: 2.2 g/dL — ABNORMAL LOW (ref 3.5–5.0)
Alkaline Phosphatase: 181 U/L — ABNORMAL HIGH (ref 38–126)
Anion gap: 11 (ref 5–15)
BUN: 35 mg/dL — ABNORMAL HIGH (ref 6–20)
CO2: 23 mmol/L (ref 22–32)
Calcium: 8.9 mg/dL (ref 8.9–10.3)
Chloride: 103 mmol/L (ref 98–111)
Creatinine, Ser: 0.86 mg/dL (ref 0.44–1.00)
GFR, Estimated: >60 mL/min (ref >60)
Glucose, Bld: 117 mg/dL — ABNORMAL HIGH (ref 70–99)
Potassium: 3.2 mmol/L — ABNORMAL LOW (ref 3.5–5.1)
Sodium: 137 mmol/L (ref 135–145)
Total Bilirubin: 0.7 mg/dL (ref 0.0–1.2)
Total Protein: 5.3 g/dL — ABNORMAL LOW (ref 6.5–8.1)

## 2023-10-13 MED ORDER — POTASSIUM CHLORIDE 20 MEQ PO PACK
40.0000 meq | PACK | Freq: Once | ORAL | Status: AC
  Administered 2023-10-13: 40 meq via ORAL
  Filled 2023-10-13: qty 2

## 2023-10-13 MED ORDER — ADULT MULTIVITAMIN W/MINERALS CH
1.0000 | ORAL_TABLET | Freq: Every day | ORAL | Status: DC
  Administered 2023-10-14: 1 via ORAL
  Filled 2023-10-13: qty 1

## 2023-10-13 NOTE — Telephone Encounter (Signed)
 Called patient to inform her of moved nutrition appointment. While on the line she asked if her appointments can be moved to later in the day on 3/3- Please advise on scheduling.

## 2023-10-13 NOTE — Progress Notes (Cosign Needed)
    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   Closed compression fracture: of body of L1 vertebra  which impairs their ability to perform daily activities like grooming and toileting in the home.  A walker will not resolve  issue with performing activities of daily living. A wheelchair will allow patient to safely perform daily activities. Patient is not able to propel themselves in the home using a standard weight wheelchair due to general weakness. Patient can self propel in the lightweight wheelchair. Length of need Lifetime. Accessories: elevating leg rests (ELRs), wheel locks, extensions and anti-tippers.   10/13/23 1212

## 2023-10-13 NOTE — Plan of Care (Signed)

## 2023-10-13 NOTE — TOC Progression Note (Signed)
 Transition of Care [facility]) - Progression Note    Patient Details  Name: Tonya [name] MRN: [id number] Date of Birth: 1965-11-11  [facility] of [facility]) CM/SW Contact  Chapman Fitch, RN Phone Number: 10/13/2023, 3:33 PM  Clinical Narrative:      Anticipated that patient will dc tomorrow Discussed recs of home health with patient.  She is aware that she may have a copay per visit.  Patient states that she does not have a preference of agency.   Referral made to cory with [facility]  Referral made to Jon with [employer] for WC.  Patient states that spouse will be transporting at discharge    Expected Discharge Plan and Services                                               Social Determinants of Health (SDOH) Interventions SDOH Screenings   Food Insecurity: No Food Insecurity (10/11/2023)  Housing: Low Risk  (10/11/2023)  Transportation Needs: No Transportation Needs (10/11/2023)  Utilities: Not At Risk (10/11/2023)  Depression (PHQ2-9): Low Risk  (09/21/2023)  Financial Resource Strain: Low Risk  (07/23/2019)  Physical Activity: Unknown (07/23/2019)  Social Connections: Moderately Integrated (10/11/2023)  Stress: No Stress Concern Present (07/23/2019)  Tobacco Use: Low Risk  (10/11/2023)  Health Literacy: Adequate Health Literacy (09/29/2023)    Readmission Risk Interventions    10/05/2023    2:26 PM  Readmission Risk Prevention Plan  Transportation Screening Complete  HRI or Home Care Consult Complete  Palliative Care Screening Complete  Medication Review (RN Care Manager) Complete

## 2023-10-13 NOTE — Progress Notes (Signed)
 Initial Nutrition Assessment  DOCUMENTATION CODES:   Morbid obesity  INTERVENTION:   Ensure Enlive po TID, each supplement provides 350 kcal and 20 grams of protein.  MVI po daily   Pt at high refeed risk; recommend monitor potassium, magnesium and phosphorus labs daily until stable  Daily weights   NUTRITION DIAGNOSIS:   Increased nutrient needs related to cancer and cancer related treatments as evidenced by estimated needs.  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  RD working remotely.  Per chart review, pt reports persistent poor appetite and oral intake pta along with ongoing weakness. Pt reported some solid food getting stuck in her throat. Pt has been drinking Ensure at home. Pt noted with possible ileus and bowel dilation; this seems to be improved. Pt is passing flatus and BMs. Pt tolerating soft diet and is documented to have eaten 100% of her breakfast. Pt is ordered for Ensure supplements. RD will add daily MVI. Pt is likely at high refeed risk. No new weight this admission; will request daily weights. RD will obtain exam and history at follow up.   Medications reviewed and include: insulin, MVI, thyroid, vancomycin, ceftriaxone   Labs reviewed: K 3.2(L), BUN 35(H) Wbc- 34.0(H), Hgb 10.3(L), Hct 29.5(L) Cbgs- 115, 103, 99, 101, 96 x 24 hrs  AIC 6.5(H)- 2/10  NUTRITION - FOCUSED PHYSICAL EXAM: Unable to perform at this time  Diet Order:   Diet Order             DIET SOFT Fluid consistency: Thin  Diet  effective now                   EDUCATION NEEDS:   No education needs have been identified at this time  Skin:  Skin Assessment: Reviewed RN Assessment (ecchymosis)  Last BM:  2/19- type 7  Height:   Ht Readings from Last 1 Encounters:  10/10/23 5\' 4"  (1.626 m)    Weight:   Wt Readings from Last 1 Encounters:  10/10/23 120.2 kg    Ideal Body Weight:  54.5 kg  BMI:  Body mass index is 45.49 kg/m.  Estimated Nutritional Needs:   Kcal:  2400-2700kcal/day  Protein:  >120g/day  Fluid:  1.7-1.9L/day  Betsey Holiday MS, RD, [name] If unable to be reached, please send secure chat to "RD inpatient" available from 8:00a-4:00p daily

## 2023-10-13 NOTE — Telephone Encounter (Signed)
 I called the pt and I told her that I can call Tonya [name] because she is off on Thursday. I told her I thought it was because she had CT scan  in hospital. Later today Tonya [name] said the same . I called the patient and let her know that since she has  been in hospital and she moved it out to 3/3 so that the pt. Can rest and try to get stronger and the pt. Says that they were talking about PT at home she said

## 2023-10-13 NOTE — Progress Notes (Signed)
 Date of Admission:  10/10/2023    Day 12 of antibiotic  ID: Tonya [name] is a 58 y.o. female  Active Problems:   Hypothyroidism (acquired)   Ischemic cardiomyopathy   Essential hypertension   Diabetes mellitus (HCC)   Coronary artery disease involving native coronary artery of native heart with angina pectoris (HCC)   Endometrial carcinoma (HCC)   Severe sepsis (HCC)   Hyponatremia   C. difficile colitis   Thrombocytopenia (HCC)   Closed compression fracture of body of L1 vertebra (HCC)   Actinomyces infection Tonya [name] is a 58 y.o. 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  It was thought to be due to [facility] and she was given laxatives.  On August 29, 2023 when she saw her PCP and the pain was worsening he sent her for a stat CT scan.  And that showed extensive abdominal and retroperitoneal adenopathy, uterine enlargement and suggestion of soft tissue fullness within the lower uterine segment and cervix.  There was periuterine interstitial thickening.  Omental nodularity was noted.  Peritoneal thickening was noted.  The impression was widespread metastatic disease including to the abdominal pelvic and lower thoracic nodes omentum and lungs.  Mild bilateral hydronephrosis and proximal hydroureter.  She was referred to GYN oncology and saw them on 09/01/2023 and underwent pelvic examination and biopsies and Pap smear.  But that was limited.  The cervical biopsy was consistent with squamous mucosa and abundant necrotic debris but not definitely diagnostic for malignancy.  FNA of the retroperitoneal lymph node was done on 09/13/23 and showed poorly differentiated carcinoma with neuroendocrine  features.  She saw oncology Dr. Smith Robert on 09/21/2023.  Port was placed on 09/23/2023 by interventional radiology..  Patient got her first chemo on 09/26/2023. 09/27/23 and 09/28/23 She received carboplatin and etoposide and also received Decadron,  She got pegfilgrastim on 09/30/23   Patient presented to the ED on 10/02/2023 with diarrhea, weakness and congestion. She took miralax and prune juice for constipation over the previous few days She is not able to eat. Mouth sore and dry She has some shortness of breath which made her come to ED She was neutropenic, no fever, blood culture from 2/9  had strep viridans thopught to be due to translocation from GIT due to neutropenia and diarrhea., The stool tested positive for cdiff antigen , toxin negative and PCR positive and she was prescribed PO vancomycin for a total of 14 days And after IV ceftriaxone and repeat blood culture from 2/12 neg for any bacteria she was discharged home on Po amoxicillin to complete 1 week ( total of 10 days). On 10/08/23 the blood culture finalized with another bacteria called actinomyces naeslundii. The significance was unclear though this could be from GI or GU or oral cavity-The amoxicillin was extended for another week by the hospitalist.  Husband at bed side Subjective: Pt is in bed. Feeling a little better  Medications:   feeding supplement  237 mL Oral TID   insulin aspart  0-9 Units Subcutaneous TID WC   metoprolol tartrate  12.5 mg Oral BID   [START ON 10/14/2023] multivitamin with minerals  1 tablet Oral Daily   thyroid  60 mg Oral QAC breakfast   vancomycin  125 mg Oral QID  Objective: Vital signs in last 24 hours: Patient Vitals for the past 24 hrs:  BP Temp Temp src Pulse Resp SpO2  10/13/23 0849 (!) 151/83 97.8 F (36.6 C) -- (!) 101 16 97 %  10/13/23 0356 (!) 142/75 98.3 F (36.8 C) -- 95 20 95 %  10/12/23 1950 (!) 148/91 98.3 F (36.8 C) Oral (!) 111 18 96 %       PHYSICAL EXAM:  General: Alert,  cooperative, pale Lungs: Clear to auscultation bilaterally. No Wheezing or Rhonchi. No rales. Heart: Regular rate and rhythm, no murmur, rub or gallop. Abdomen: Soft, upper abdomen fullness Extremities: atraumatic, no cyanosis. No edema. No clubbing Skin: No rashes or lesions. Or bruising Lymph: Cervical, supraclavicular normal. Neurologic: Grossly non-focal  Lab Results    Latest Ref Rng & Units 10/13/2023    5:22 AM 10/12/2023    5:14 AM 10/11/2023    6:25 AM  CBC  WBC 4.0 - 10.5 K/uL 34.0  30.0  24.4   Hemoglobin 12.0 - 15.0 g/dL 16.1  09.6  04.5   Hematocrit 36.0 - 46.0 % 29.5  29.8  32.8   Platelets 150 - 400 K/uL 140  98  76        Latest Ref Rng & Units 10/13/2023    5:22 AM 10/12/2023    5:14 AM 10/11/2023    6:25 AM  CMP  Glucose 70 - 99 mg/dL 409  811  914   BUN 6 - 20 mg/dL 35  39  52   Creatinine 0.44 - 1.00 mg/dL 7.82  9.56  2.13   Sodium 135 - 145 mmol/L 137  134  134   Potassium 3.5 - 5.1 mmol/L 3.2  3.3  4.3   Chloride 98 - 111 mmol/L 103  102  100   CO2 22 - 32 mmol/L 23  21  23    Calcium 8.9 - 10.3 mg/dL 8.9  9.1  9.4   Total Protein 6.5 - 8.1 g/dL 5.3  5.4    Total Bilirubin 0.0 - 1.2 mg/dL 0.7  1.0    Alkaline Phos 38 - 126 U/L 181  172    AST 15 - 41 U/L 26  25    ALT 0 - 44 U/L 16  17        Microbiology: No BC this admission  Studies/Results: US PELVIC COMPLETE WITH TRANSVAGINAL Result Date: 10/12/2023 CLINICAL DATA:  Endometrial carcinoma EXAM: TRANSABDOMINAL AND TRANSVAGINAL ULTRASOUND OF PELVIS TECHNIQUE: Both transabdominal and transvaginal ultrasound examinations of the pelvis were performed. Transabdominal technique was performed for global imaging of the pelvis including uterus, ovaries, adnexal regions, and pelvic cul-de-sac. It was necessary to proceed with endovaginal exam following the transabdominal exam to visualize the uterus. COMPARISON:  CT 10/10/2023 FINDINGS: Uterus Measurements: 11.7 by 6.8 x 6.4 cm = volume: 268 mL. Poorly visible  due to habitus and limited imaging window, artifact from air in the vagina. Endometrium Thickness: About 21.6 mm.  Poorly visible Right ovary Not visualized Left ovary Not visualized. Other findings No abnormal free fluid. IMPRESSION: 1. Very limited exam secondary to habitus and shadowing artifact, possibly from air within the vagina. Endometrium appears thickened up to 21.6 mm but is very poorly visualized. 2. Nonvisualized ovaries. Electronically Signed   By: Jasmine Pang M.D.   On: 10/12/2023 17:16   Impression/recommendation  Pt presenting with fatigue weakness and shortnes sof breath Was recently in hospital for severe neutropenia post chemo for Endomerial/cervical cancer with neuroendocrine features and  had diarrhea and diagnosed with cdiff ( toxin neg) , strep viridans bacteremia and then actinomyces in blood culture- went home on appropriate antibiotic   Leucocytosis likely  secondary to Peg filgrastin she received on 09/30/23 It is worsening I do not see any source for sepsis- this is more SIRS Tachycardia and sob could be from anemia   Anemia Thrombocytopenia- improving Due to chemo   Fatigue - multifactorial - severe hypothyroidism ( on 2/3 TSH is 22) consider repeating it or adjusting synthroid dose) post chemo AM cortisol is 31   Recent strep viridans and actinomyces bacteria in blood 1/4- both are GI /GU organism- low bioburden and delay in time to positivity Can be due to translocation when she had neutropenia and diarrhea causing transient bacteremia.Repeat blood culture from 2/7 neg I do not find evidence for a deep seated infection like abdominal abscess, uterine source ( though air in uterus could be post biopsy/procedure), port not the culprit ultrasound pelvis and transvaginal was done to look for any pyometra or pelvic abscess. Limited study- endometrial thickening was 21.6 mm. Utesu had air, no free fluid in pelvis- ovaries were not visualized- difficult imaging due to  body habitus Day 12 of antibiotic- will give for another 2 weeks  ( amoxicillin 1 gram PO TID )and may extend beyond after gyn examination- Dr.Rao will decide   Recent cdiff infection  ( Toxin neg, antigen positive and PCR positive) on Po vancomycin 125mg  q6. Until 10/16/23 followed by 125 mg every day until completion of amoxicillin   Endometrial /cervical cancer with mets Stage IV ( mets to peritoenum, omentum, peliv, abdominal adenopathy, lung ) with neureoendocrine features- has received ist dose of chemo Followed by heme onc Recent CT shows slight progression in the omental implants which are malignant tissue   L1 compression fracture new since Jan 2025- She recently had a fall at home and it could be due to that No features to suggest discitis or osteo   B/l mild hydronephrosis- is it due to malignancy?   Fluid fluid loops of small intestine- she has some fullness leg edema Has hypoalbuminemia  Discussed with patient and care team ID will sign off- call if needed

## 2023-10-13 NOTE — Progress Notes (Signed)
 PROGRESS NOTE    Tonya [name]  ZOX:[id number] DOB: [birth year] DOA: 10/10/2023 PCP: Danella Penton, MD   Assessment & Plan:   Active Problems:   Severe sepsis (HCC)   C. difficile colitis   Endometrial carcinoma (HCC)   Hypothyroidism (acquired)   Ischemic cardiomyopathy   Essential hypertension   Diabetes mellitus (HCC)   Coronary artery disease involving native coronary artery of native heart with angina pectoris (HCC)   Hyponatremia   Thrombocytopenia (HCC)   Closed compression fracture of body of L1 vertebra (HCC)   Actinomyces infection  Assessment and Plan: Severe sepsis: met criteria w/ tachycardia, leukocytosis & bacteremia & c. Diff colitis. Continue on po vanco & IV rocephin as per ID    C. difficile colitis: continue on po vanco until 10/20/23 as per ID   Endometrial carcinoma: stage IV. Poor prognosis as per onco    Closed compression fracture: of body of L1 vertebra. PT/OT recs HH    Thrombocytopenia: possibly secondary to recent chemo. Will continue to monitor     Bacteremia: secondary to streptococcus, possibly gut translocation. Continue on IV rocephin while inpatient and d/c on amoxicillin as per ID.    Hyponatremia: resolved    Hx of CAD: continue on metoprolol    DM2: well controlled, HbA1c 6.5. Continue on SSI w/ accuchecks    HTN: continue on metoprolol   Ischemic cardiomyopathy: echo showed 07/2019 w/ EF 45-50%.  Continue on BB   Hypothyroidism: continue on armour thyroid   Morbid obesity: BMI 45.4. Complicates overall & prognosis      DVT prophylaxis: SCDs Code Status: full  Family Communication:  Disposition Plan: likely d/c home   Level of care: Telemetry Medical  Status is: Inpatient Remains inpatient appropriate because: severity of illness, requiring IV abxs    Consultants:    Procedures:  Antimicrobials: on rocephin, po vanco    Subjective: Pt c/o malaise   Objective: Vitals:   10/12/23 1636 10/12/23 1950 10/13/23  0356 10/13/23 0849  BP: 138/73 (!) 148/91 (!) 142/75 (!) 151/83  Pulse: 92 (!) 111 95 (!) 101  Resp: 18 18 20 16   Temp: 98.5 F (36.9 C) 98.3 F (36.8 C) 98.3 F (36.8 C) 97.8 F (36.6 C)  TempSrc:  Oral    SpO2: 97% 96% 95% 97%  Weight:      Height:        Intake/Output Summary (Last 24 hours) at 10/13/2023 0917 Last data filed at 10/12/2023 1103 Gross per 24 hour  Intake 240 ml  Output --  Net 240 ml   Filed Weights   10/10/23 0948  Weight: 120.2 kg    Examination:  General exam: appears comfortable. Morbidly obese Respiratory system: decreased breath sounds b/l  Cardiovascular system: S1/S2+. No rubs or clicks  Gastrointestinal system: Abd is soft, NT, obese & hypoactive bowel sounds  Central nervous system: Alert & oriented. Moves all extremities  Psychiatry: Judgement and insight appears at baseline. Flat mood and affect    Data Reviewed: I have personally reviewed following labs and imaging studies  CBC: Recent Labs  Lab 10/10/23 1004 10/11/23 0625 10/12/23 0514 10/13/23 0522  WBC 22.3* 24.4* 30.0* 34.0*  HGB 13.2 11.5* 10.4* 10.3*  HCT 36.7 32.8* 29.8* 29.5*  MCV 86.6 87.2 87.4 88.1  PLT 72* 76* 98* 140*   Basic Metabolic Panel: Recent Labs  Lab 10/10/23 1004 10/11/23 0625 10/12/23 0514 10/13/23 0522  NA 131* 134* 134* 137  K 4.0 4.3 3.3* 3.2*  CL  95* 100 102 103  CO2 18* 23 21* 23  GLUCOSE 182* 129* 100* 117*  BUN 56* 52* 39* 35*  CREATININE 1.19* 1.23* 0.91 0.86  CALCIUM 9.8 9.4 9.1 8.9   GFR: Estimated Creatinine Clearance: 90 mL/min (by C-G formula based on SCr of 0.86 mg/dL). Liver Function Tests: Recent Labs  Lab 10/12/23 0514 10/13/23 0522  AST 25 26  ALT 17 16  ALKPHOS 172* 181*  BILITOT 1.0 0.7  PROT 5.4* 5.3*  ALBUMIN 2.2* 2.2*   No results for input(s): "LIPASE", "AMYLASE" in the last 168 hours. No results for input(s): "AMMONIA" in the last 168 hours. Coagulation Profile: No results for input(s): "INR", "PROTIME"  in the last 168 hours. Cardiac Enzymes: No results for input(s): "CKTOTAL", "CKMB", "CKMBINDEX", "TROPONINI" in the last 168 hours. BNP (last 3 results) No results for input(s): "PROBNP" in the last 8760 hours. HbA1C: No results for input(s): "HGBA1C" in the last 72 hours. CBG: Recent Labs  Lab 10/12/23 0807 10/12/23 1103 10/12/23 1717 10/12/23 2038 10/13/23 0858  GLUCAP 96 101* 99 103* 115*   Lipid Profile: No results for input(s): "CHOL", "HDL", "LDLCALC", "TRIG", "CHOLHDL", "LDLDIRECT" in the last 72 hours. Thyroid Function Tests: No results for input(s): "TSH", "T4TOTAL", "FREET4", "T3FREE", "THYROIDAB" in the last 72 hours. Anemia Panel: No results for input(s): "VITAMINB12", "FOLATE", "FERRITIN", "TIBC", "IRON", "RETICCTPCT" in the last 72 hours. Sepsis Labs: Recent Labs  Lab 10/10/23 1004 10/10/23 1236 10/10/23 1353 10/12/23 0514  PROCALCITON  --   --  2.36  --   LATICACIDVEN 2.2* 2.5*  --  0.9    Recent Results (from the past 240 hours)  Culture, blood (Routine X 2) w Reflex to ID Panel     Status: None   Collection Time: 10/05/23  6:41 AM   Specimen: BLOOD  Result Value Ref Range Status   Specimen Description BLOOD BLOOD LEFT ARM  Final   Special Requests   Final    BOTTLES DRAWN AEROBIC AND ANAEROBIC Blood Culture adequate volume   Culture   Final    NO GROWTH 5 DAYS Performed at [facility], [address] [place], [place] [address]    Report Status 10/10/2023 FINAL  Final  Culture, blood (Routine X 2) w Reflex to ID Panel     Status: None   Collection Time: 10/05/23  6:46 AM   Specimen: BLOOD  Result Value Ref Range Status   Specimen Description BLOOD BLOOD RIGHT HAND  Final   Special Requests   Final    BOTTLES DRAWN AEROBIC AND ANAEROBIC Blood Culture adequate volume   Culture   Final    NO GROWTH 5 DAYS Performed at [facility], [address] [place], [place] [address]    Report Status 10/10/2023 FINAL  Final  Resp  panel by RT-PCR (RSV, Flu A&B, Covid) Anterior Nasal Swab     Status: None   Collection Time: 10/10/23 12:36 PM   Specimen: Anterior Nasal Swab  Result Value Ref Range Status   SARS Coronavirus 2 by RT PCR NEGATIVE NEGATIVE Final    Comment: (NOTE) SARS-CoV-2 target nucleic acids are NOT DETECTED.  The SARS-CoV-2 RNA is generally detectable in upper respiratory specimens during the acute phase of infection. The lowest concentration of SARS-CoV-2 viral copies this assay can detect is 138 copies/mL. A negative result does not preclude SARS-Cov-2 infection and should not be used as the sole basis for treatment or other patient management decisions. A negative result may occur with  improper specimen collection/handling, submission of specimen other than nasopharyngeal swab, presence of viral mutation(s) within the areas targeted by this assay, and inadequate number of viral copies(<138 copies/mL). A negative result must be combined with clinical observations, patient history, and epidemiological information. The expected result is Negative.  Fact Sheet for Patients:  BloggerCourse.com  Fact Sheet for Healthcare Providers:  SeriousBroker.it  This test is no t yet approved or cleared by the [place] FDA and  has been authorized for detection and/or diagnosis of SARS-CoV-2 by FDA under an Emergency Use Authorization (EUA). This EUA will remain  in effect (meaning this test can be used) for the duration of the COVID-19 declaration under Section 564(b)(1) of the Act, 21 U.S.C.section 360bbb-3(b)(1), unless the authorization is terminated  or revoked sooner.       Influenza A by PCR NEGATIVE NEGATIVE Final   Influenza B by PCR NEGATIVE NEGATIVE Final    Comment: (NOTE) The Xpert Xpress SARS-CoV-2/FLU/RSV plus assay is intended as an aid in the diagnosis of influenza from Nasopharyngeal swab specimens and should not be used as a  sole basis for treatment. Nasal washings and aspirates are unacceptable for Xpert Xpress SARS-CoV-2/FLU/RSV testing.  Fact Sheet for Patients: BloggerCourse.com  Fact Sheet for Healthcare Providers: SeriousBroker.it  This test is not yet approved or cleared by the [place] FDA and has been authorized for detection and/or diagnosis of SARS-CoV-2 by FDA under an Emergency Use Authorization (EUA). This EUA will remain in effect (meaning this test can be used) for the duration of the COVID-19 declaration under Section 564(b)(1) of the Act, 21 U.S.C. section 360bbb-3(b)(1), unless the authorization is terminated or revoked.     Resp Syncytial Virus by PCR NEGATIVE NEGATIVE Final    Comment: (NOTE) Fact Sheet for Patients: BloggerCourse.com  Fact Sheet for Healthcare Providers: SeriousBroker.it  This test is not yet approved or cleared by the [place] FDA and has been authorized for detection and/or diagnosis of SARS-CoV-2 by FDA under an Emergency Use Authorization (EUA). This EUA will remain in effect (meaning this test can be used) for the duration of the COVID-19 declaration under Section 564(b)(1) of the Act, 21 U.S.C. section 360bbb-3(b)(1), unless the authorization is terminated or revoked.  Performed at [facility], [address] [place], [place] [address]   Respiratory (~20 pathogens) panel by PCR     Status: None   Collection Time: 10/10/23  4:21 PM   Specimen: Nasopharyngeal Swab; Respiratory  Result Value Ref Range Status   Adenovirus NOT DETECTED NOT DETECTED Final   Coronavirus 229E NOT DETECTED NOT DETECTED Final    Comment: (NOTE) The Coronavirus on the Respiratory Panel, DOES NOT test for the novel  Coronavirus (2019 nCoV)    Coronavirus HKU1 NOT DETECTED NOT DETECTED Final   Coronavirus NL63 NOT DETECTED NOT DETECTED Final   Coronavirus  OC43 NOT DETECTED NOT DETECTED Final   Metapneumovirus NOT DETECTED NOT DETECTED Final   Rhinovirus / Enterovirus NOT DETECTED NOT DETECTED Final   Influenza A NOT DETECTED NOT DETECTED Final   Influenza B NOT DETECTED NOT DETECTED Final   Parainfluenza Virus 1 NOT DETECTED NOT DETECTED Final   Parainfluenza Virus 2 NOT DETECTED NOT DETECTED Final   Parainfluenza Virus 3 NOT DETECTED NOT DETECTED Final   Parainfluenza Virus 4 NOT DETECTED NOT DETECTED Final   Respiratory Syncytial Virus NOT DETECTED NOT DETECTED Final   Bordetella pertussis NOT DETECTED NOT DETECTED Final   Bordetella Parapertussis NOT DETECTED NOT DETECTED Final  Chlamydophila pneumoniae NOT DETECTED NOT DETECTED Final   Mycoplasma pneumoniae NOT DETECTED NOT DETECTED Final    Comment: Performed at [facility], [address] [place], [place] [address]         Radiology Studies: US PELVIC COMPLETE WITH TRANSVAGINAL Result Date: 10/12/2023 CLINICAL DATA:  Endometrial carcinoma EXAM: TRANSABDOMINAL AND TRANSVAGINAL ULTRASOUND OF PELVIS TECHNIQUE: Both transabdominal and transvaginal ultrasound examinations of the pelvis were performed. Transabdominal technique was performed for global imaging of the pelvis including uterus, ovaries, adnexal regions, and pelvic cul-de-sac. It was necessary to proceed with endovaginal exam following the transabdominal exam to visualize the uterus. COMPARISON:  CT 10/10/2023 FINDINGS: Uterus Measurements: 11.7 by 6.8 x 6.4 cm = volume: 268 mL. Poorly visible due to habitus and limited imaging window, artifact from air in the vagina. Endometrium Thickness: About 21.6 mm.  Poorly visible Right ovary Not visualized Left ovary Not visualized. Other findings No abnormal free fluid. IMPRESSION: 1. Very limited exam secondary to habitus and shadowing artifact, possibly from air within the vagina. Endometrium appears thickened up to 21.6 mm but is very poorly visualized. 2. Nonvisualized ovaries.  Electronically Signed   By: Jasmine Pang M.D.   On: 10/12/2023 17:16        Scheduled Meds:  feeding supplement  237 mL Oral TID   insulin aspart  0-9 Units Subcutaneous TID WC   metoprolol tartrate  12.5 mg Oral BID   thyroid  60 mg Oral QAC breakfast   vancomycin  125 mg Oral QID   Continuous Infusions:  cefTRIAXone (ROCEPHIN)  IV 2 g (10/12/23 2048)     LOS: 3 days      Charise Killian, MD [facility] Pager [phone]  If 7PM-7AM, please contact night-coverage [email or web address] 10/13/2023, 9:17 AM

## 2023-10-13 NOTE — Progress Notes (Signed)
 PT Cancellation Note  Patient Details Name: Tonya [name] MRN: [id number] DOB: October 20, 1965   Cancelled Treatment:    Reason Eval/Treat Not Completed: Patient at procedure or test/unavailable Patient was waiting for NT to get her cleaned up. Attempted 2x. Will return tomorrow.    Jozelynn Danielson 10/13/2023, 3:37 PM

## 2023-10-14 ENCOUNTER — Ambulatory Visit: Payer: BC Managed Care – PPO

## 2023-10-14 ENCOUNTER — Other Ambulatory Visit: Payer: Self-pay

## 2023-10-14 DIAGNOSIS — R7881 Bacteremia: Secondary | ICD-10-CM | POA: Diagnosis not present

## 2023-10-14 LAB — COMPREHENSIVE METABOLIC PANEL
ALT: 19 U/L (ref 0–44)
AST: 28 U/L (ref 15–41)
Albumin: 2.3 g/dL — ABNORMAL LOW (ref 3.5–5.0)
Alkaline Phosphatase: 179 U/L — ABNORMAL HIGH (ref 38–126)
Anion gap: 11 (ref 5–15)
BUN: 28 mg/dL — ABNORMAL HIGH (ref 6–20)
CO2: 21 mmol/L — ABNORMAL LOW (ref 22–32)
Calcium: 8.8 mg/dL — ABNORMAL LOW (ref 8.9–10.3)
Chloride: 104 mmol/L (ref 98–111)
Creatinine, Ser: 0.81 mg/dL (ref 0.44–1.00)
GFR, Estimated: >60 mL/min (ref >60)
Glucose, Bld: 102 mg/dL — ABNORMAL HIGH (ref 70–99)
Potassium: 3.2 mmol/L — ABNORMAL LOW (ref 3.5–5.1)
Sodium: 136 mmol/L (ref 135–145)
Total Bilirubin: 0.7 mg/dL (ref 0.0–1.2)
Total Protein: 5.6 g/dL — ABNORMAL LOW (ref 6.5–8.1)

## 2023-10-14 LAB — LEGIONELLA PNEUMOPHILA SEROGP 1 UR AG: L. pneumophila Serogp 1 Ur Ag: NEGATIVE

## 2023-10-14 LAB — CBC
HCT: 28.7 % — ABNORMAL LOW (ref 36.0–46.0)
Hemoglobin: 9.9 g/dL — ABNORMAL LOW (ref 12.0–15.0)
MCH: 30.6 pg (ref 26.0–34.0)
MCHC: 34.5 g/dL (ref 30.0–36.0)
MCV: 88.6 fL (ref 80.0–100.0)
Platelets: 204 10*3/uL (ref 150–400)
RBC: 3.24 MIL/uL — ABNORMAL LOW (ref 3.87–5.11)
RDW: 15.2 % (ref 11.5–15.5)
WBC: 35.8 10*3/uL — ABNORMAL HIGH (ref 4.0–10.5)
nRBC: 0.9 % — ABNORMAL HIGH (ref 0.0–0.2)

## 2023-10-14 LAB — MAGNESIUM: Magnesium: 2.1 mg/dL (ref 1.7–2.4)

## 2023-10-14 LAB — GLUCOSE, CAPILLARY
Glucose-Capillary: 104 mg/dL — ABNORMAL HIGH (ref 70–99)
Glucose-Capillary: 110 mg/dL — ABNORMAL HIGH (ref 70–99)
Glucose-Capillary: 107 mg/dL — ABNORMAL HIGH (ref 70–99)

## 2023-10-14 LAB — PHOSPHORUS: Phosphorus: 1.8 mg/dL — ABNORMAL LOW (ref 2.5–4.6)

## 2023-10-14 MED ORDER — AMOXICILLIN 500 MG PO CAPS: 1000.0000 mg | ORAL_CAPSULE | Freq: Three times a day (TID) | ORAL | 0 refills | Status: AC

## 2023-10-14 MED ORDER — VANCOMYCIN HCL 125 MG PO CAPS: 125.0000 mg | ORAL_CAPSULE | Freq: Four times a day (QID) | ORAL | 0 refills | Status: DC

## 2023-10-14 MED ORDER — METOPROLOL TARTRATE 25 MG PO TABS: 12.5000 mg | ORAL_TABLET | Freq: Two times a day (BID) | ORAL | 0 refills | Status: DC

## 2023-10-14 NOTE — Discharge Summary (Signed)
 Physician Discharge Summary  Zamarah Ullmer HQI:[id number] DOB: 10-09-1965 DOA: 10/10/2023  PCP: Danella Penton, MD  Admit date: 10/10/2023 Discharge date: 10/14/2023  Admitted From: home  Disposition:   home w/ home health   Recommendations for Outpatient Follow-up:  Follow up with PCP in 1-2 weeks F/u w/ onco, Dr. Smith Robert, w/in 1 week   Home Health: yes Equipment/Devices: wheelchair   Discharge Condition: stable  CODE STATUS: full  Diet recommendation: Heart Healthy / Carb Modified   Brief/Interim Summary: HPI was taken from Dr. Alvester Morin: Tonya [name] is a 58 y.o. female with medical history significant of obesity, endometrial cancer, hypertension, SIADH, hypothyroidism, chronic HFpEF, recent diagnosis of C. difficile as well as strep bacteremia presenting with sepsis, pneumonia.  Patient noted to have been admitted February 9-February 13 for issues including sepsis related to C. difficile diarrhea.  Also with blood cultures positive for strep.  ID consulted with concern for gut translocation in setting of bacteremia.  Was given course of oral amoxicillin at discharge as well as oral vancomycin.  Patient reports resolution of diarrhea.  Does report worsening shortness of breath over the past 2 to 3 days.  Positive malaise.  No chest pain.  Worsening abdominal pain and distention.  Noted baseline endometrial cancer with noted metastatic disease on January CT abdomen pelvis.  Recently started on chemotherapy.  Followed by Dr. Smith Robert.  Mild orthopnea.  No focal hemiparesis or confusion. Presented to the ER afebrile, heart rate in the 100s, BP stable.  Satting well room air.  White count 22.3, hemoglobin 13.2, platelets 72, troponin 20s, procalcitonin 2.36, lactate 2.5.  COVID flu and RSV negative.  Creatinine 1.2.  Glucose 180. CXR w/ L sided PNA.  Discharge Diagnoses:  Active Problems:   Severe sepsis (HCC)   C. difficile colitis   Endometrial carcinoma (HCC)   Hypothyroidism (acquired)   Ischemic  cardiomyopathy   Essential hypertension   Diabetes mellitus (HCC)   Coronary artery disease involving native coronary artery of native heart with angina pectoris (HCC)   Hyponatremia   Thrombocytopenia (HCC)   Closed compression fracture of body of L1 vertebra (HCC)   Actinomyces infection  Severe sepsis: met criteria w/ tachycardia, leukocytosis & bacteremia & c. Diff colitis. Continue on po vanco & IV rocephin as per ID. Severe sepsis resolved    C. difficile colitis: continue on po vanco until the course of amoxicillin is complete as per ID    Endometrial carcinoma: stage IV. Poor prognosis as per onco    Closed compression fracture: of body of L1 vertebra. PT/OT recs HH    Thrombocytopenia: possibly secondary to recent chemo. Will continue to monitor     Bacteremia: secondary to streptococcus, possibly gut translocation. Continue on IV rocephin while inpatient and d/c on amoxicillin x14 days as per ID.    Hyponatremia: resolved    Hx of CAD: continue on metoprolol    DM2: well controlled, HbA1c 6.5. Continue on SSI w/ accuchecks    HTN: continue on metoprolol    Ischemic cardiomyopathy: echo showed 07/2019 w/ EF 45-50%.  Continue on BB   Hypothyroidism: continue on armour thyroid    Morbid obesity: BMI 45.4. Complicates overall & prognosis   Discharge Instructions  Discharge Instructions     Diet - low sodium heart healthy   Complete by: As directed    Discharge instructions   Complete by: As directed    F/u w/ PCP in 1-2 weeks. F/u w/ onco, Dr. Smith Robert, w/in 1 week  Increase activity slowly   Complete by: As directed       Allergies as of 10/14/2023   No Known Allergies      Medication List     TAKE these medications    amoxicillin 500 MG capsule Commonly known as: AMOXIL Take 2 capsules (1,000 mg total) by mouth 3 (three) times daily for 14 days. What changed: Another medication with the same name was removed. Continue taking this medication, and follow  the directions you see here.   lidocaine-prilocaine cream Commonly known as: EMLA Apply to affected area once   losartan 25 MG tablet Commonly known as: COZAAR Take 50 mg by mouth daily.   metoprolol tartrate 25 MG tablet Commonly known as: LOPRESSOR Take 0.5 tablets (12.5 mg total) by mouth 2 (two) times daily.   Multi-Vitamin tablet Take 1 tablet by mouth daily.   ondansetron 8 MG tablet Commonly known as: Zofran Take 1 tablet (8 mg total) by mouth every 8 (eight) hours as needed for nausea, vomiting or refractory nausea / vomiting. Start on the third day after carboplatin.   oxyCODONE 5 MG immediate release tablet Commonly known as: Oxy IR/ROXICODONE Take 5 mg by mouth 3 (three) times daily as needed for moderate pain (pain score 4-6).   prochlorperazine 10 MG tablet Commonly known as: COMPAZINE Take 1 tablet (10 mg total) by mouth every 6 (six) hours as needed for nausea or vomiting.   sodium chloride 1 g tablet Take 1 tablet (1 g total) by mouth 2 (two) times daily with a meal.   thyroid 120 MG tablet Commonly known as: ARMOUR Take 60 mg by mouth daily before breakfast.   vancomycin 125 MG capsule Commonly known as: VANCOCIN Take 1 capsule (125 mg total) by mouth 4 (four) times daily. Take one capsule by mouth fours times a day until 10/16/23 then starting 2/24 take one capsule once a day until complete amoxicillin therapy (on 10/29/2023) What changed:  when to take this additional instructions   Vitamin D (Ergocalciferol) 1.25 MG (50000 UNIT) Caps capsule Commonly known as: DRISDOL Take 50,000 Units by mouth once a week.   ERGOCALCIFEROL PO Take 1 capsule by mouth every 7 (seven) days.               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   Closed  compression fracture: of body of L1 vertebra  which impairs their ability to perform daily activities like grooming and toileting in the home.  A walker will not resolve  issue with performing activities of daily living. A wheelchair will allow patient to safely perform daily activities. Patient is not able to propel themselves in the home using a standard weight wheelchair due to general weakness. Patient can self propel in the lightweight wheelchair. Length of need Lifetime. Accessories: elevating leg rests (ELRs), wheel locks, extensions and anti-tippers.   10/13/23 1212            No Known Allergies  Consultations: ID Onco  Gen surg    Procedures/Studies: US PELVIC COMPLETE WITH TRANSVAGINAL Result Date: 10/12/2023 CLINICAL DATA:  Endometrial carcinoma EXAM: TRANSABDOMINAL AND TRANSVAGINAL ULTRASOUND OF PELVIS TECHNIQUE: Both transabdominal and transvaginal ultrasound examinations of the pelvis were performed.  Transabdominal technique was performed for global imaging of the pelvis including uterus, ovaries, adnexal regions, and pelvic cul-de-sac. It was necessary to proceed with endovaginal exam following the transabdominal exam to visualize the uterus. COMPARISON:  CT 10/10/2023 FINDINGS: Uterus Measurements: 11.7 by 6.8 x 6.4 cm = volume: 268 mL. Poorly visible due to habitus and limited imaging window, artifact from air in the vagina. Endometrium Thickness: About 21.6 mm.  Poorly visible Right ovary Not visualized Left ovary Not visualized. Other findings No abnormal free fluid. IMPRESSION: 1. Very limited exam secondary to habitus and shadowing artifact, possibly from air within the vagina. Endometrium appears thickened up to 21.6 mm but is very poorly visualized. 2. Nonvisualized ovaries. Electronically Signed   By: Jasmine Pang M.D.   On: 10/12/2023 17:16   CT Angio Chest Pulmonary Embolism (PE) W or WO Contrast Result Date: 10/10/2023 CLINICAL DATA:  Chest and abdominal pain.  Concern for pulmonary embolism. Shortness of breath. History of metastatic disease for cervical cancer and chemotherapy. EXAM: CT ANGIOGRAPHY CHEST CT ABDOMEN AND PELVIS WITH CONTRAST TECHNIQUE: Multidetector CT imaging of the chest was performed using the standard protocol during bolus administration of intravenous contrast. Multiplanar CT image reconstructions and MIPs were obtained to evaluate the vascular anatomy. Multidetector CT imaging of the abdomen and pelvis was performed using the standard protocol during bolus administration of intravenous contrast. RADIATION DOSE REDUCTION: This exam was performed according to the departmental dose-optimization program which includes automated exposure control, adjustment of the mA and/or kV according to patient size and/or use of iterative reconstruction technique. CONTRAST:  OMNIPAQUE IOHEXOL 350 MG/ML SOLN COMPARISON:  Chest radiograph dated 10/10/2023. CT abdomen pelvis dated 08/29/2023. FINDINGS: CTA CHEST FINDINGS Cardiovascular: There is no cardiomegaly or pericardial effusion. Three-vessel coronary vascular calcification. Right-sided Port-A-Cath with tip at the cavoatrial junction. Mild atherosclerotic calcification of the thoracic aorta. No aneurysmal dilatation or dissection. The origins of the great vessels of the aortic arch appear patent. No pulmonary artery embolus identified. Mediastinum/Nodes: Top-normal left hilar lymph nodes measure up to 1 cm. Posterior mediastinal adenopathy measure approximately 2 cm in short axis. Top-normal lymph node to the left of the trachea at the level of the thoracic inlet measures 9 mm short axis. Rounded left axillary and subpectoral lymph nodes measure up to 9 mm. The esophagus is grossly unremarkable. No mediastinal fluid collection. Lungs/Pleura: Faint diffuse hazy density throughout the lungs, likely atelectasis. No consolidative changes. There is no pleural effusion or pneumothorax. The central airways are  patent. Musculoskeletal: Degenerative changes of the spine. No acute osseous pathology. Review of the MIP images confirms the above findings. CT ABDOMEN and PELVIS FINDINGS No intra-abdominal free air.  Small ascites. Hepatobiliary: The liver is unremarkable. No biliary dilatation. The gallbladder is unremarkable. Pancreas: Unremarkable. No pancreatic ductal dilatation or surrounding inflammatory changes. Spleen: Normal in size without focal abnormality. Adrenals/Urinary Tract: The adrenal glands are unremarkable. There is mild bilateral hydronephrosis relatively similar to prior CT. The visualized ureters appear unremarkable. The urinary bladder is minimally distended. Stomach/Bowel: Mildly dilated fluid-filled loops of small bowel suggestive of enteritis. Gradual transition likely represents a degree of ileus. No definite obstruction. Mild sigmoid diverticulosis. There is loose stool throughout the colon consistent with diarrheal state. The appendix is unremarkable. Vascular/Lymphatic: Mild aortoiliac atherosclerotic disease. The IVC is unremarkable. No portal venous gas. Retroperitoneal adenopathy relatively similar to prior CT. Reproductive: The uterus is anteverted. Air is noted in the vagina. The ovaries are grossly unremarkable. Other: There is slight progression of  omental implants and nodularity since the prior CT. Musculoskeletal: Diffuse subcutaneous edema of the pelvic wall. L1 compression fracture with greater than 50% loss of vertebral body height, new since the prior CT, likely acute or subacute. There is approximately 4 mm retropulsion of the inferior posterior cortex. There is disc desiccation and vacuum phenomena at L1-L2. Review of the MIP images confirms the above findings. IMPRESSION: 1. No acute intrathoracic pathology. No CT evidence of pulmonary artery embolus. 2. Probable enteritis with associated ileus and diarrheal state. Clinical correlation is recommended. 3. Mild bilateral  hydronephrosis relatively similar to prior CT. 4. Slight progression of omental implants and nodularity since the prior CT. 5. L1 compression fracture, new since the prior CT, likely acute or subacute. 6.  Aortic Atherosclerosis (ICD10-I70.0). Electronically Signed   By: Elgie Collard M.D.   On: 10/10/2023 20:02   CT ABDOMEN PELVIS W CONTRAST Result Date: 10/10/2023 CLINICAL DATA:  Chest and abdominal pain. Concern for pulmonary embolism. Shortness of breath. History of metastatic disease for cervical cancer and chemotherapy. EXAM: CT ANGIOGRAPHY CHEST CT ABDOMEN AND PELVIS WITH CONTRAST TECHNIQUE: Multidetector CT imaging of the chest was performed using the standard protocol during bolus administration of intravenous contrast. Multiplanar CT image reconstructions and MIPs were obtained to evaluate the vascular anatomy. Multidetector CT imaging of the abdomen and pelvis was performed using the standard protocol during bolus administration of intravenous contrast. RADIATION DOSE REDUCTION: This exam was performed according to the departmental dose-optimization program which includes automated exposure control, adjustment of the mA and/or kV according to patient size and/or use of iterative reconstruction technique. CONTRAST:  OMNIPAQUE IOHEXOL 350 MG/ML SOLN COMPARISON:  Chest radiograph dated 10/10/2023. CT abdomen pelvis dated 08/29/2023. FINDINGS: CTA CHEST FINDINGS Cardiovascular: There is no cardiomegaly or pericardial effusion. Three-vessel coronary vascular calcification. Right-sided Port-A-Cath with tip at the cavoatrial junction. Mild atherosclerotic calcification of the thoracic aorta. No aneurysmal dilatation or dissection. The origins of the great vessels of the aortic arch appear patent. No pulmonary artery embolus identified. Mediastinum/Nodes: Top-normal left hilar lymph nodes measure up to 1 cm. Posterior mediastinal adenopathy measure approximately 2 cm in short axis. Top-normal lymph  node to the left of the trachea at the level of the thoracic inlet measures 9 mm short axis. Rounded left axillary and subpectoral lymph nodes measure up to 9 mm. The esophagus is grossly unremarkable. No mediastinal fluid collection. Lungs/Pleura: Faint diffuse hazy density throughout the lungs, likely atelectasis. No consolidative changes. There is no pleural effusion or pneumothorax. The central airways are patent. Musculoskeletal: Degenerative changes of the spine. No acute osseous pathology. Review of the MIP images confirms the above findings. CT ABDOMEN and PELVIS FINDINGS No intra-abdominal free air.  Small ascites. Hepatobiliary: The liver is unremarkable. No biliary dilatation. The gallbladder is unremarkable. Pancreas: Unremarkable. No pancreatic ductal dilatation or surrounding inflammatory changes. Spleen: Normal in size without focal abnormality. Adrenals/Urinary Tract: The adrenal glands are unremarkable. There is mild bilateral hydronephrosis relatively similar to prior CT. The visualized ureters appear unremarkable. The urinary bladder is minimally distended. Stomach/Bowel: Mildly dilated fluid-filled loops of small bowel suggestive of enteritis. Gradual transition likely represents a degree of ileus. No definite obstruction. Mild sigmoid diverticulosis. There is loose stool throughout the colon consistent with diarrheal state. The appendix is unremarkable. Vascular/Lymphatic: Mild aortoiliac atherosclerotic disease. The IVC is unremarkable. No portal venous gas. Retroperitoneal adenopathy relatively similar to prior CT. Reproductive: The uterus is anteverted. Air is noted in the vagina. The ovaries are grossly unremarkable.  Other: There is slight progression of omental implants and nodularity since the prior CT. Musculoskeletal: Diffuse subcutaneous edema of the pelvic wall. L1 compression fracture with greater than 50% loss of vertebral body height, new since the prior CT, likely acute or  subacute. There is approximately 4 mm retropulsion of the inferior posterior cortex. There is disc desiccation and vacuum phenomena at L1-L2. Review of the MIP images confirms the above findings. IMPRESSION: 1. No acute intrathoracic pathology. No CT evidence of pulmonary artery embolus. 2. Probable enteritis with associated ileus and diarrheal state. Clinical correlation is recommended. 3. Mild bilateral hydronephrosis relatively similar to prior CT. 4. Slight progression of omental implants and nodularity since the prior CT. 5. L1 compression fracture, new since the prior CT, likely acute or subacute. 6.  Aortic Atherosclerosis (ICD10-I70.0). Electronically Signed   By: Elgie Collard M.D.   On: 10/10/2023 20:02   DG Chest 2 View Result Date: 10/10/2023 CLINICAL DATA:  Shortness of breath EXAM: CHEST - 2 VIEW COMPARISON:  Chest radiograph dated 10/02/2023, CT abdomen and pelvis dated 08/29/2023 FINDINGS: Lines/tubes: Right chest wall port tip projects over the superior cavoatrial junction. Lungs: Well inflated lungs. Patchy opacity projecting over the left costophrenic angle. Pleura: No pneumothorax or pleural effusion. Heart/mediastinum: The heart size and mediastinal contours are within normal limits. Bones: Compression deformity of approximately L1, new from 08/29/2023. IMPRESSION: 1. Patchy opacity projecting over the left costophrenic angle, which may represent atelectasis or pneumonia. 2. New approximately L1 compression deformity. Electronically Signed   By: Agustin Cree M.D.   On: 10/10/2023 15:45   DG Chest Port 1 View Result Date: 10/02/2023 CLINICAL DATA:  Diarrhea. Recently started chemotherapy for cervical cancer. EXAM: PORTABLE CHEST 1 VIEW COMPARISON:  Chest x-ray dated July 23, 2019. FINDINGS: Right chest wall port catheter with tip at the cavoatrial junction. The heart size and mediastinal contours are within normal limits. Normal pulmonary vascularity. No focal consolidation, pleural  effusion, or pneumothorax. No acute osseous abnormality. IMPRESSION: No active disease. Electronically Signed   By: Obie Dredge M.D.   On: 10/02/2023 16:14   IR IMAGING GUIDED PORT INSERTION Result Date: 09/23/2023 INDICATION: chemotherapy administration History of gyn malignancy. EXAM: IMPLANTED PORT A CATH PLACEMENT WITH ULTRASOUND AND FLUOROSCOPIC GUIDANCE MEDICATIONS: 25 mg Benadryl IV. ANESTHESIA/SEDATION: Moderate (conscious) sedation was employed during this procedure. A total of Versed 1 mg and Fentanyl 100 mcg was administered intravenously. Moderate Sedation Time: 21 minutes. The patient's level of consciousness and vital signs were monitored continuously by radiology nursing throughout the procedure under my direct supervision. FLUOROSCOPY TIME:  Fluoroscopic dose; 4 mGy COMPLICATIONS: None immediate. PROCEDURE: The procedure, risks, benefits, and alternatives were explained to the patient. Questions regarding the procedure were encouraged and answered. The patient understands and consents to the procedure. The RIGHT neck and chest were prepped with chlorhexidine in a sterile fashion, and a sterile drape was applied covering the operative field. Maximum barrier sterile technique with sterile gowns and gloves were used for the procedure. A timeout was performed prior to the initiation of the procedure. Local anesthesia was provided with 1% lidocaine with epinephrine. After creating a small venotomy incision, a micropuncture kit was utilized to access the internal jugular vein under direct, real-time ultrasound guidance. Ultrasound image documentation was performed. The microwire was kinked to measure appropriate catheter length. A subcutaneous port pocket was then created along the upper chest wall utilizing a combination of sharp and blunt dissection. The pocket was irrigated with sterile saline. A single  lumen power injectable port was chosen for placement. The 8 Fr catheter was tunneled from the  port pocket site to the venotomy incision. The port was placed in the pocket. The external catheter was trimmed to appropriate length. At the venotomy, an 8 Fr peel-away sheath was placed over a guidewire under fluoroscopic guidance. The catheter was then placed through the sheath and the sheath was removed. Final catheter positioning was confirmed and documented with a fluoroscopic spot radiograph. The port was accessed with a Huber needle, aspirated and flushed with heparinized saline. The port pocket incision was closed with interrupted 3-0 Vicryl suture then Dermabond was applied, including at the venotomy incision. Dressings were placed. The patient tolerated the procedure well without immediate post procedural complication. IMPRESSION: Successful placement of a RIGHT internal jugular approach power injectable Port-A-Cath. The tip of the catheter is positioned within the proximal RIGHT atrium. The catheter is ready for immediate use. Roanna Banning, MD [facility] Radiology Electronically Signed   By: Roanna Banning M.D.   On: 09/23/2023 14:42   (Echo, Carotid, EGD, Colonoscopy, ERCP)    Subjective: Pt c/o fatigue    Discharge Exam: Vitals:   10/14/23 0303 10/14/23 0757  BP: (!) 154/72 (!) 154/81  Pulse: 95 98  Resp: 17 18  Temp: 97.7 F (36.5 C) 97.8 F (36.6 C)  SpO2: 97% 97%   Vitals:   10/13/23 1952 10/14/23 0303 10/14/23 0500 10/14/23 0757  BP: 133/72 (!) 154/72  (!) 154/81  Pulse: 97 95  98  Resp: 20 17  18   Temp: (!) 97.4 F (36.3 C) 97.7 F (36.5 C)  97.8 F (36.6 C)  TempSrc:  Oral    SpO2: 96% 97%  97%  Weight:   120.2 kg   Height:        General: Pt is alert, awake, not in acute distress. Morbidly obese  Cardiovascular:S1/S2 +, no rubs, no gallops Respiratory: decreased breath sounds b/l  Abdominal: Soft, NT, obese, bowel sounds + Extremities: no cyanosis    The results of significant diagnostics from this  hospitalization (including imaging, microbiology, ancillary and laboratory) are listed below for reference.     Microbiology: Recent Results (from the past 240 hours)  Culture, blood (Routine X 2) w Reflex to ID Panel     Status: None   Collection Time: 10/05/23  6:41 AM   Specimen: BLOOD  Result Value Ref Range Status   Specimen Description BLOOD BLOOD LEFT ARM  Final   Special Requests   Final    BOTTLES DRAWN AEROBIC AND ANAEROBIC Blood Culture adequate volume   Culture   Final    NO GROWTH 5 DAYS Performed at [facility], [address] [place], [place] [address]    Report Status 10/10/2023 FINAL  Final  Culture, blood (Routine X 2) w Reflex to ID Panel     Status: None   Collection Time: 10/05/23  6:46 AM   Specimen: BLOOD  Result Value Ref Range Status   Specimen Description BLOOD BLOOD RIGHT HAND  Final   Special Requests   Final    BOTTLES DRAWN AEROBIC AND ANAEROBIC Blood Culture adequate volume   Culture   Final    NO GROWTH 5 DAYS Performed at [facility], [address] [place], [place] [address]    Report Status 10/10/2023 FINAL  Final  Resp panel by RT-PCR (RSV, Flu A&B, Covid) Anterior Nasal Swab     Status: None   Collection  Time: 10/10/23 12:36 PM   Specimen: Anterior Nasal Swab  Result Value Ref Range Status   SARS Coronavirus 2 by RT PCR NEGATIVE NEGATIVE Final    Comment: (NOTE) SARS-CoV-2 target nucleic acids are NOT DETECTED.  The SARS-CoV-2 RNA is generally detectable in upper respiratory specimens during the acute phase of infection. The lowest concentration of SARS-CoV-2 viral copies this assay can detect is 138 copies/mL. A negative result does not preclude SARS-Cov-2 infection and should not be used as the sole basis for treatment or other patient management decisions. A negative result may occur with  improper specimen collection/handling, submission of specimen other than nasopharyngeal swab, presence of viral  mutation(s) within the areas targeted by this assay, and inadequate number of viral copies(<138 copies/mL). A negative result must be combined with clinical observations, patient history, and epidemiological information. The expected result is Negative.  Fact Sheet for Patients:  BloggerCourse.com  Fact Sheet for Healthcare Providers:  SeriousBroker.it  This test is no t yet approved or cleared by the [place] FDA and  has been authorized for detection and/or diagnosis of SARS-CoV-2 by FDA under an Emergency Use Authorization (EUA). This EUA will remain  in effect (meaning this test can be used) for the duration of the COVID-19 declaration under Section 564(b)(1) of the Act, 21 U.S.C.section 360bbb-3(b)(1), unless the authorization is terminated  or revoked sooner.       Influenza A by PCR NEGATIVE NEGATIVE Final   Influenza B by PCR NEGATIVE NEGATIVE Final    Comment: (NOTE) The Xpert Xpress SARS-CoV-2/FLU/RSV plus assay is intended as an aid in the diagnosis of influenza from Nasopharyngeal swab specimens and should not be used as a sole basis for treatment. Nasal washings and aspirates are unacceptable for Xpert Xpress SARS-CoV-2/FLU/RSV testing.  Fact Sheet for Patients: BloggerCourse.com  Fact Sheet for Healthcare Providers: SeriousBroker.it  This test is not yet approved or cleared by the [place] FDA and has been authorized for detection and/or diagnosis of SARS-CoV-2 by FDA under an Emergency Use Authorization (EUA). This EUA will remain in effect (meaning this test can be used) for the duration of the COVID-19 declaration under Section 564(b)(1) of the Act, 21 U.S.C. section 360bbb-3(b)(1), unless the authorization is terminated or revoked.     Resp Syncytial Virus by PCR NEGATIVE NEGATIVE Final    Comment: (NOTE) Fact Sheet for  Patients: BloggerCourse.com  Fact Sheet for Healthcare Providers: SeriousBroker.it  This test is not yet approved or cleared by the [place] FDA and has been authorized for detection and/or diagnosis of SARS-CoV-2 by FDA under an Emergency Use Authorization (EUA). This EUA will remain in effect (meaning this test can be used) for the duration of the COVID-19 declaration under Section 564(b)(1) of the Act, 21 U.S.C. section 360bbb-3(b)(1), unless the authorization is terminated or revoked.  Performed at [facility], [address] [place], [place] [address]   Respiratory (~20 pathogens) panel by PCR     Status: None   Collection Time: 10/10/23  4:21 PM   Specimen: Nasopharyngeal Swab; Respiratory  Result Value Ref Range Status   Adenovirus NOT DETECTED NOT DETECTED Final   Coronavirus 229E NOT DETECTED NOT DETECTED Final    Comment: (NOTE) The Coronavirus on the Respiratory Panel, DOES NOT test for the novel  Coronavirus (2019 nCoV)    Coronavirus HKU1 NOT DETECTED NOT DETECTED Final   Coronavirus NL63 NOT DETECTED NOT DETECTED Final   Coronavirus OC43 NOT DETECTED NOT DETECTED Final   Metapneumovirus  NOT DETECTED NOT DETECTED Final   Rhinovirus / Enterovirus NOT DETECTED NOT DETECTED Final   Influenza A NOT DETECTED NOT DETECTED Final   Influenza B NOT DETECTED NOT DETECTED Final   Parainfluenza Virus 1 NOT DETECTED NOT DETECTED Final   Parainfluenza Virus 2 NOT DETECTED NOT DETECTED Final   Parainfluenza Virus 3 NOT DETECTED NOT DETECTED Final   Parainfluenza Virus 4 NOT DETECTED NOT DETECTED Final   Respiratory Syncytial Virus NOT DETECTED NOT DETECTED Final   Bordetella pertussis NOT DETECTED NOT DETECTED Final   Bordetella Parapertussis NOT DETECTED NOT DETECTED Final   Chlamydophila pneumoniae NOT DETECTED NOT DETECTED Final   Mycoplasma pneumoniae NOT DETECTED NOT DETECTED Final    Comment: Performed at  [facility], [address], [place], [place] [address]     Labs: BNP (last 3 results) Recent Labs    10/10/23 1353  BNP 308.7*   Basic Metabolic Panel: Recent Labs  Lab 10/10/23 1004 10/11/23 0625 10/12/23 0514 10/13/23 0522 10/14/23 0548  NA 131* 134* 134* 137 136  K 4.0 4.3 3.3* 3.2* 3.2*  CL 95* 100 102 103 104  CO2 18* 23 21* 23 21*  GLUCOSE 182* 129* 100* 117* 102*  BUN 56* 52* 39* 35* 28*  CREATININE 1.19* 1.23* 0.91 0.86 0.81  CALCIUM 9.8 9.4 9.1 8.9 8.8*  MG  --   --   --   --  2.1  PHOS  --   --   --   --  1.8*   Liver Function Tests: Recent Labs  Lab 10/12/23 0514 10/13/23 0522 10/14/23 0548  AST 25 26 28   ALT 17 16 19   ALKPHOS 172* 181* 179*  BILITOT 1.0 0.7 0.7  PROT 5.4* 5.3* 5.6*  ALBUMIN 2.2* 2.2* 2.3*   No results for input(s): "LIPASE", "AMYLASE" in the last 168 hours. No results for input(s): "AMMONIA" in the last 168 hours. CBC: Recent Labs  Lab 10/10/23 1004 10/11/23 0625 10/12/23 0514 10/13/23 0522 10/14/23 0548  WBC 22.3* 24.4* 30.0* 34.0* 35.8*  HGB 13.2 11.5* 10.4* 10.3* 9.9*  HCT 36.7 32.8* 29.8* 29.5* 28.7*  MCV 86.6 87.2 87.4 88.1 88.6  PLT 72* 76* 98* 140* 204   Cardiac Enzymes: No results for input(s): "CKTOTAL", "CKMB", "CKMBINDEX", "TROPONINI" in the last 168 hours. BNP: Invalid input(s): "POCBNP" CBG: Recent Labs  Lab 10/13/23 1233 10/13/23 1631 10/13/23 2106 10/14/23 0756 10/14/23 1110  GLUCAP 116* 117* 109* 104* 110*   D-Dimer No results for input(s): "DDIMER" in the last 72 hours. Hgb A1c No results for input(s): "HGBA1C" in the last 72 hours. Lipid Profile No results for input(s): "CHOL", "HDL", "LDLCALC", "TRIG", "CHOLHDL", "LDLDIRECT" in the last 72 hours. Thyroid function studies No results for input(s): "TSH", "T4TOTAL", "T3FREE", "THYROIDAB" in the last 72 hours.  Invalid input(s): "FREET3" Anemia work up No results for input(s): "VITAMINB12", "FOLATE", "FERRITIN", "TIBC", "IRON",  "RETICCTPCT" in the last 72 hours. Urinalysis    Component Value Date/Time   COLORURINE YELLOW (A) 10/10/2023 1621   APPEARANCEUR HAZY (A) 10/10/2023 1621   LABSPEC >1.046 (H) 10/10/2023 1621   PHURINE 5.0 10/10/2023 1621   GLUCOSEU NEGATIVE 10/10/2023 1621   HGBUR NEGATIVE 10/10/2023 1621   BILIRUBINUR NEGATIVE 10/10/2023 1621   KETONESUR 5 (A) 10/10/2023 1621   PROTEINUR NEGATIVE 10/10/2023 1621   NITRITE NEGATIVE 10/10/2023 1621   LEUKOCYTESUR NEGATIVE 10/10/2023 1621   Sepsis Labs Recent Labs  Lab 10/11/23 0625 10/12/23 0514 10/13/23 0522 10/14/23 0548  WBC 24.4* 30.0*  34.0* 35.8*   Microbiology Recent Results (from the past 240 hours)  Culture, blood (Routine X 2) w Reflex to ID Panel     Status: None   Collection Time: 10/05/23  6:41 AM   Specimen: BLOOD  Result Value Ref Range Status   Specimen Description BLOOD BLOOD LEFT ARM  Final   Special Requests   Final    BOTTLES DRAWN AEROBIC AND ANAEROBIC Blood Culture adequate volume   Culture   Final    NO GROWTH 5 DAYS Performed at [facility], [address] [place], [place] [address]    Report Status 10/10/2023 FINAL  Final  Culture, blood (Routine X 2) w Reflex to ID Panel     Status: None   Collection Time: 10/05/23  6:46 AM   Specimen: BLOOD  Result Value Ref Range Status   Specimen Description BLOOD BLOOD RIGHT HAND  Final   Special Requests   Final    BOTTLES DRAWN AEROBIC AND ANAEROBIC Blood Culture adequate volume   Culture   Final    NO GROWTH 5 DAYS Performed at [facility], [address] [place], [place] [address]    Report Status 10/10/2023 FINAL  Final  Resp panel by RT-PCR (RSV, Flu A&B, Covid) Anterior Nasal Swab     Status: None   Collection Time: 10/10/23 12:36 PM   Specimen: Anterior Nasal Swab  Result Value Ref Range Status   SARS Coronavirus 2 by RT PCR NEGATIVE NEGATIVE Final    Comment: (NOTE) SARS-CoV-2 target nucleic acids are NOT DETECTED.  The SARS-CoV-2  RNA is generally detectable in upper respiratory specimens during the acute phase of infection. The lowest concentration of SARS-CoV-2 viral copies this assay can detect is 138 copies/mL. A negative result does not preclude SARS-Cov-2 infection and should not be used as the sole basis for treatment or other patient management decisions. A negative result may occur with  improper specimen collection/handling, submission of specimen other than nasopharyngeal swab, presence of viral mutation(s) within the areas targeted by this assay, and inadequate number of viral copies(<138 copies/mL). A negative result must be combined with clinical observations, patient history, and epidemiological information. The expected result is Negative.  Fact Sheet for Patients:  BloggerCourse.com  Fact Sheet for Healthcare Providers:  SeriousBroker.it  This test is no t yet approved or cleared by the [place] FDA and  has been authorized for detection and/or diagnosis of SARS-CoV-2 by FDA under an Emergency Use Authorization (EUA). This EUA will remain  in effect (meaning this test can be used) for the duration of the COVID-19 declaration under Section 564(b)(1) of the Act, 21 U.S.C.section 360bbb-3(b)(1), unless the authorization is terminated  or revoked sooner.       Influenza A by PCR NEGATIVE NEGATIVE Final   Influenza B by PCR NEGATIVE NEGATIVE Final    Comment: (NOTE) The Xpert Xpress SARS-CoV-2/FLU/RSV plus assay is intended as an aid in the diagnosis of influenza from Nasopharyngeal swab specimens and should not be used as a sole basis for treatment. Nasal washings and aspirates are unacceptable for Xpert Xpress SARS-CoV-2/FLU/RSV testing.  Fact Sheet for Patients: BloggerCourse.com  Fact Sheet for Healthcare Providers: SeriousBroker.it  This test is not yet approved or cleared by the  [place] FDA and has been authorized for detection and/or diagnosis of SARS-CoV-2 by FDA under an Emergency Use Authorization (EUA). This EUA will remain in effect (meaning this test can be used) for the duration of the COVID-19 declaration under  Section 564(b)(1) of the Act, 21 U.S.C. section 360bbb-3(b)(1), unless the authorization is terminated or revoked.     Resp Syncytial Virus by PCR NEGATIVE NEGATIVE Final    Comment: (NOTE) Fact Sheet for Patients: BloggerCourse.com  Fact Sheet for Healthcare Providers: SeriousBroker.it  This test is not yet approved or cleared by the [place] FDA and has been authorized for detection and/or diagnosis of SARS-CoV-2 by FDA under an Emergency Use Authorization (EUA). This EUA will remain in effect (meaning this test can be used) for the duration of the COVID-19 declaration under Section 564(b)(1) of the Act, 21 U.S.C. section 360bbb-3(b)(1), unless the authorization is terminated or revoked.  Performed at [facility], [address] [place], [place] [address]   Respiratory (~20 pathogens) panel by PCR     Status: None   Collection Time: 10/10/23  4:21 PM   Specimen: Nasopharyngeal Swab; Respiratory  Result Value Ref Range Status   Adenovirus NOT DETECTED NOT DETECTED Final   Coronavirus 229E NOT DETECTED NOT DETECTED Final    Comment: (NOTE) The Coronavirus on the Respiratory Panel, DOES NOT test for the novel  Coronavirus (2019 nCoV)    Coronavirus HKU1 NOT DETECTED NOT DETECTED Final   Coronavirus NL63 NOT DETECTED NOT DETECTED Final   Coronavirus OC43 NOT DETECTED NOT DETECTED Final   Metapneumovirus NOT DETECTED NOT DETECTED Final   Rhinovirus / Enterovirus NOT DETECTED NOT DETECTED Final   Influenza A NOT DETECTED NOT DETECTED Final   Influenza B NOT DETECTED NOT DETECTED Final   Parainfluenza Virus 1 NOT DETECTED NOT DETECTED Final   Parainfluenza Virus 2  NOT DETECTED NOT DETECTED Final   Parainfluenza Virus 3 NOT DETECTED NOT DETECTED Final   Parainfluenza Virus 4 NOT DETECTED NOT DETECTED Final   Respiratory Syncytial Virus NOT DETECTED NOT DETECTED Final   Bordetella pertussis NOT DETECTED NOT DETECTED Final   Bordetella Parapertussis NOT DETECTED NOT DETECTED Final   Chlamydophila pneumoniae NOT DETECTED NOT DETECTED Final   Mycoplasma pneumoniae NOT DETECTED NOT DETECTED Final    Comment: Performed at [facility], [address] [place], [place] [address]     Time coordinating discharge: Over 30 minutes  SIGNED:   Charise Killian, MD  [facility] 10/14/2023, 12:58 PM Pager   If 7PM-7AM, please contact night-coverage [email or web address]

## 2023-10-14 NOTE — TOC Transition Note (Signed)
 Transition of Care [facility]) - Discharge Note   Patient Details  Name: Tonya [name] MRN: [id number] Date of Birth: 05/14/1966  [facility] CM/SW Contact:  Chapman Fitch, RN Phone Number: 10/14/2023, 3:09 PM   Clinical Narrative:     Patient to dc today Kandee Keen with [facility] notified of discharge.  WC was delivered to room        Patient Goals and CMS Choice            Discharge Placement                       Discharge Plan and Services Additional resources added to the After Visit Summary for                                       Social Drivers of Health (SDOH) Interventions SDOH Screenings   Food Insecurity: No Food Insecurity (10/11/2023)  Housing: Low Risk  (10/11/2023)  Transportation Needs: No Transportation Needs (10/11/2023)  Utilities: Not At Risk (10/11/2023)  Depression (PHQ2-9): Low Risk  (09/21/2023)  Financial Resource Strain: Low Risk  (07/23/2019)  Physical Activity: Unknown (07/23/2019)  Social Connections: Moderately Integrated (10/11/2023)  Stress: No Stress Concern Present (07/23/2019)  Tobacco Use: Low Risk  (10/11/2023)  Health Literacy: Adequate Health Literacy (09/29/2023)     Readmission Risk Interventions    10/05/2023    2:26 PM  Readmission Risk Prevention Plan  Transportation Screening Complete  HRI or Home Care Consult Complete  Palliative Care Screening Complete  Medication Review (RN Care Manager) Complete

## 2023-10-14 NOTE — Plan of Care (Signed)

## 2023-10-14 NOTE — Plan of Care (Signed)
  Problem: Fluid Volume: Goal: Hemodynamic stability will improve Outcome: Adequate for Discharge   Problem: Clinical Measurements: Goal: Diagnostic test results will improve Outcome: Adequate for Discharge Goal: Signs and symptoms of infection will decrease Outcome: Adequate for Discharge   Problem: Respiratory: Goal: Ability to maintain adequate ventilation will improve Outcome: Adequate for Discharge   Problem: Education: Goal: Ability to describe self-care measures that may prevent or decrease complications (Diabetes Survival Skills Education) will improve Outcome: Adequate for Discharge Goal: Individualized Educational Video(s) Outcome: Adequate for Discharge   Problem: Coping: Goal: Ability to adjust to condition or change in health will improve Outcome: Adequate for Discharge   Problem: Fluid Volume: Goal: Ability to maintain a balanced intake and output will improve Outcome: Adequate for Discharge   Problem: Health Behavior/Discharge Planning: Goal: Ability to identify and utilize available resources and services will improve Outcome: Adequate for Discharge Goal: Ability to manage health-related needs will improve Outcome: Adequate for Discharge   Problem: Metabolic: Goal: Ability to maintain appropriate glucose levels will improve Outcome: Adequate for Discharge   Problem: Nutritional: Goal: Maintenance of adequate nutrition will improve Outcome: Adequate for Discharge Goal: Progress toward achieving an optimal weight will improve Outcome: Adequate for Discharge   Problem: Skin Integrity: Goal: Risk for impaired skin integrity will decrease Outcome: Adequate for Discharge   Problem: Tissue Perfusion: Goal: Adequacy of tissue perfusion will improve Outcome: Adequate for Discharge   Problem: Education: Goal: Knowledge of General Education information will improve Description: Including pain rating scale, medication(s)/side effects and non-pharmacologic  comfort measures Outcome: Adequate for Discharge   Problem: Health Behavior/Discharge Planning: Goal: Ability to manage health-related needs will improve Outcome: Adequate for Discharge   Problem: Clinical Measurements: Goal: Ability to maintain clinical measurements within normal limits will improve Outcome: Adequate for Discharge Goal: Will remain free from infection Outcome: Adequate for Discharge Goal: Diagnostic test results will improve Outcome: Adequate for Discharge Goal: Respiratory complications will improve Outcome: Adequate for Discharge Goal: Cardiovascular complication will be avoided Outcome: Adequate for Discharge   Problem: Activity: Goal: Risk for activity intolerance will decrease Outcome: Adequate for Discharge   Problem: Nutrition: Goal: Adequate nutrition will be maintained Outcome: Adequate for Discharge   Problem: Coping: Goal: Level of anxiety will decrease Outcome: Adequate for Discharge   Problem: Elimination: Goal: Will not experience complications related to bowel motility Outcome: Adequate for Discharge Goal: Will not experience complications related to urinary retention Outcome: Adequate for Discharge   Problem: Pain Managment: Goal: General experience of comfort will improve and/or be controlled Outcome: Adequate for Discharge   Problem: Safety: Goal: Ability to remain free from injury will improve Outcome: Adequate for Discharge   Problem: Skin Integrity: Goal: Risk for impaired skin integrity will decrease Outcome: Adequate for Discharge

## 2023-10-14 NOTE — Progress Notes (Signed)
 Physical Therapy Treatment Patient Details Name: Tonya [name] MRN: [id number] DOB: [birth year] Today's Date: 10/14/2023   History of Present Illness Pt is a 58 y.o. female presenting with sepsis, pneumonia and recent C-diff 2/9-2/13. CT findings: acute or subacute L1 compression fracture and probable enteritis with associated ileus and diarrheal state. PMH includes obesity, endometrial cancer-recently started on chemotherapy, hypertension, SIADH, hypothyroidism, chronic HFpEF, recent diagnosis of C. difficile as well as strep bacteremia.    PT Comments  Patient received up in recliner. She is agreeable to PT session. Patient is able to stand from recliner with supervision and ambulate 3 laps to door in room with supervision and RW. Patient progressing well toward goals. She will continue to benefit from skilled PT to improve strength and endurance.    If plan is discharge home, recommend the following: A little help with walking and/or transfers;A little help with bathing/dressing/bathroom;Help with stairs or ramp for entrance;Assist for transportation;Assistance with cooking/housework   Can travel by private vehicle      yes  Equipment Recommendations  None recommended by PT    Recommendations for Other Services       Precautions / Restrictions Precautions Precautions: Fall Precaution/Restrictions Comments: new L1 fx-back precautions educated although no formal consult in Restrictions Weight Bearing Restrictions Per Provider Order: No     Mobility  Bed Mobility               General bed mobility comments: NT up in recliner pre/post session    Transfers Overall transfer level: Modified independent Equipment used: Rolling walker (2 wheels) Transfers: Sit to/from Stand Sit to Stand: Supervision                Ambulation/Gait Ambulation/Gait assistance: Supervision Gait Distance (Feet): 75 Feet Assistive device: Rolling walker (2 wheels) Gait Pattern/deviations:  Step-through pattern, Decreased step length - right, Decreased step length - left Gait velocity: decr     General Gait Details: Performed 3 reps of ambulation from recliner to door and back 25' x 3. Seated rest between each rep. No overt difficulty wtih this other than slight fatigue   Stairs             Wheelchair Mobility     Tilt Bed    Modified Rankin (Stroke Patients Only)       Balance Overall balance assessment: Modified Independent Sitting-balance support: Feet supported Sitting balance-Leahy Scale: Normal     Standing balance support: Bilateral upper extremity supported, During functional activity, Reliant on assistive device for balance Standing balance-Leahy Scale: Good                              Communication Communication Communication: No apparent difficulties  Cognition Arousal: Alert Behavior During Therapy: WFL for tasks assessed/performed   PT - Cognitive impairments: No apparent impairments                         Following commands: Intact      Cueing Cueing Techniques: Verbal cues  Exercises Other Exercises Other Exercises: Performed STS x 5 reps from recliner with supervision    General Comments        Pertinent Vitals/Pain Pain Assessment Pain Assessment: No/denies pain    Home Living                          Prior Function  PT Goals (current goals can now be found in the care plan section) Acute Rehab PT Goals Patient Stated Goal: improve strength PT Goal Formulation: With patient Time For Goal Achievement: 10/21/23 Potential to Achieve Goals: Good Progress towards PT goals: Progressing toward goals    Frequency    Min 1X/week      PT Plan      Co-evaluation              AM-PAC PT "6 Clicks" Mobility   Outcome Measure  Help needed turning from your back to your side while in a flat bed without using bedrails?: A Little Help needed moving from lying on  your back to sitting on the side of a flat bed without using bedrails?: A Little Help needed moving to and from a bed to a chair (including a wheelchair)?: A Little Help needed standing up from a chair using your arms (e.g., wheelchair or bedside chair)?: A Little Help needed to walk in hospital room?: A Little Help needed climbing 3-5 steps with a railing? : A Lot 6 Click Score: 17    End of Session   Activity Tolerance: Patient tolerated treatment well Patient left: in chair;with call bell/phone within reach Nurse Communication: Mobility status PT Visit Diagnosis: Muscle weakness (generalized) (M62.81)     Time: 4696-2952 PT Time Calculation (min) (ACUTE ONLY): 19 min  Charges:    $Gait Training: 8-22 mins PT General Charges $$ ACUTE PT VISIT: 1 Visit                     Thelmer Legler, PT, GCS 10/14/23,11:55 AM

## 2023-10-15 ENCOUNTER — Other Ambulatory Visit: Payer: Self-pay

## 2023-10-17 ENCOUNTER — Inpatient Hospital Stay: Payer: BC Managed Care – PPO

## 2023-10-17 ENCOUNTER — Inpatient Hospital Stay: Payer: BC Managed Care – PPO | Admitting: Oncology

## 2023-10-17 ENCOUNTER — Ambulatory Visit: Payer: BC Managed Care – PPO

## 2023-10-17 NOTE — Progress Notes (Signed)
 [facility] CSW Progress Note  Clinical Social Worker contacted patient by phone to assess needs since being hospitalized.  Dr. Smith Robert referred patient.    Her husband was able to obtain a ramp for their home for her w/c.  Home health is scheduled to go to her home on Friday, 2/28.  Patient continues to work from home.  She is arranging transportation for her treatment at the cancer center.  She stated her husband is considering taking FMLA.  CSW provided active listening and supportive counseling.    Dorothey Baseman, LCSW Clinical Social Worker [facility]

## 2023-10-18 ENCOUNTER — Inpatient Hospital Stay: Payer: BC Managed Care – PPO

## 2023-10-19 ENCOUNTER — Inpatient Hospital Stay: Payer: BC Managed Care – PPO

## 2023-10-19 NOTE — Addendum Note (Signed)
 Encounter addended by: Edward Qualia on: 10/19/2023 12:24 PM  Actions taken: Imaging Exam ended

## 2023-10-21 ENCOUNTER — Inpatient Hospital Stay: Payer: BC Managed Care – PPO

## 2023-10-21 MED FILL — Fosaprepitant Dimeglumine For IV Infusion 150 MG (Base Eq): INTRAVENOUS | Qty: 5 | Status: AC

## 2023-10-24 ENCOUNTER — Inpatient Hospital Stay (HOSPITAL_BASED_OUTPATIENT_CLINIC_OR_DEPARTMENT_OTHER): Payer: BC Managed Care – PPO | Admitting: Oncology

## 2023-10-24 ENCOUNTER — Inpatient Hospital Stay: Payer: BC Managed Care – PPO

## 2023-10-24 ENCOUNTER — Inpatient Hospital Stay: Payer: BC Managed Care – PPO | Attending: Oncology

## 2023-10-24 ENCOUNTER — Telehealth: Payer: Self-pay

## 2023-10-24 ENCOUNTER — Encounter: Payer: Self-pay | Admitting: Oncology

## 2023-10-24 VITALS — BP 147/81 | HR 95 | Temp 96.5°F | Resp 18 | Wt 257.0 lb

## 2023-10-24 DIAGNOSIS — C7A8 Other malignant neuroendocrine tumors: Secondary | ICD-10-CM

## 2023-10-24 DIAGNOSIS — C7B8 Other secondary neuroendocrine tumors: Secondary | ICD-10-CM

## 2023-10-24 DIAGNOSIS — I2699 Other pulmonary embolism without acute cor pulmonale: Secondary | ICD-10-CM | POA: Insufficient documentation

## 2023-10-24 DIAGNOSIS — E876 Hypokalemia: Secondary | ICD-10-CM | POA: Diagnosis not present

## 2023-10-24 DIAGNOSIS — Z9089 Acquired absence of other organs: Secondary | ICD-10-CM | POA: Insufficient documentation

## 2023-10-24 DIAGNOSIS — Z5189 Encounter for other specified aftercare: Secondary | ICD-10-CM | POA: Insufficient documentation

## 2023-10-24 DIAGNOSIS — E119 Type 2 diabetes mellitus without complications: Secondary | ICD-10-CM | POA: Diagnosis not present

## 2023-10-24 DIAGNOSIS — E8809 Other disorders of plasma-protein metabolism, not elsewhere classified: Secondary | ICD-10-CM | POA: Diagnosis not present

## 2023-10-24 DIAGNOSIS — C7A1 Malignant poorly differentiated neuroendocrine tumors: Secondary | ICD-10-CM | POA: Diagnosis present

## 2023-10-24 DIAGNOSIS — K5900 Constipation, unspecified: Secondary | ICD-10-CM | POA: Diagnosis not present

## 2023-10-24 DIAGNOSIS — I700 Atherosclerosis of aorta: Secondary | ICD-10-CM | POA: Diagnosis not present

## 2023-10-24 DIAGNOSIS — N1330 Unspecified hydronephrosis: Secondary | ICD-10-CM | POA: Insufficient documentation

## 2023-10-24 DIAGNOSIS — I10 Essential (primary) hypertension: Secondary | ICD-10-CM | POA: Insufficient documentation

## 2023-10-24 DIAGNOSIS — Z800 Family history of malignant neoplasm of digestive organs: Secondary | ICD-10-CM | POA: Insufficient documentation

## 2023-10-24 DIAGNOSIS — D61810 Antineoplastic chemotherapy induced pancytopenia: Secondary | ICD-10-CM | POA: Diagnosis not present

## 2023-10-24 DIAGNOSIS — Z8249 Family history of ischemic heart disease and other diseases of the circulatory system: Secondary | ICD-10-CM | POA: Insufficient documentation

## 2023-10-24 DIAGNOSIS — E039 Hypothyroidism, unspecified: Secondary | ICD-10-CM | POA: Diagnosis not present

## 2023-10-24 DIAGNOSIS — T451X5A Adverse effect of antineoplastic and immunosuppressive drugs, initial encounter: Secondary | ICD-10-CM | POA: Diagnosis not present

## 2023-10-24 DIAGNOSIS — C541 Malignant neoplasm of endometrium: Secondary | ICD-10-CM | POA: Insufficient documentation

## 2023-10-24 DIAGNOSIS — K5730 Diverticulosis of large intestine without perforation or abscess without bleeding: Secondary | ICD-10-CM | POA: Insufficient documentation

## 2023-10-24 DIAGNOSIS — Z79630 Long term (current) use of alkylating agent: Secondary | ICD-10-CM | POA: Insufficient documentation

## 2023-10-24 DIAGNOSIS — Z79899 Other long term (current) drug therapy: Secondary | ICD-10-CM | POA: Diagnosis not present

## 2023-10-24 DIAGNOSIS — J984 Other disorders of lung: Secondary | ICD-10-CM | POA: Diagnosis not present

## 2023-10-24 DIAGNOSIS — I252 Old myocardial infarction: Secondary | ICD-10-CM | POA: Insufficient documentation

## 2023-10-24 DIAGNOSIS — N179 Acute kidney failure, unspecified: Secondary | ICD-10-CM | POA: Insufficient documentation

## 2023-10-24 DIAGNOSIS — Z5111 Encounter for antineoplastic chemotherapy: Secondary | ICD-10-CM

## 2023-10-24 DIAGNOSIS — D6481 Anemia due to antineoplastic chemotherapy: Secondary | ICD-10-CM | POA: Diagnosis not present

## 2023-10-24 DIAGNOSIS — E871 Hypo-osmolality and hyponatremia: Secondary | ICD-10-CM | POA: Insufficient documentation

## 2023-10-24 DIAGNOSIS — I2510 Atherosclerotic heart disease of native coronary artery without angina pectoris: Secondary | ICD-10-CM | POA: Diagnosis not present

## 2023-10-24 DIAGNOSIS — Z79634 Long term (current) use of topoisomerase inhibitor: Secondary | ICD-10-CM | POA: Diagnosis not present

## 2023-10-24 LAB — CBC WITH DIFFERENTIAL (CANCER CENTER ONLY)
Abs Immature Granulocytes: 0.49 10*3/uL — ABNORMAL HIGH (ref 0.00–0.07)
Basophils Absolute: 0.1 10*3/uL (ref 0.0–0.1)
Basophils Relative: 1 %
Eosinophils Absolute: 0 10*3/uL (ref 0.0–0.5)
Eosinophils Relative: 0 %
HCT: 29.6 % — ABNORMAL LOW (ref 36.0–46.0)
Hemoglobin: 10 g/dL — ABNORMAL LOW (ref 12.0–15.0)
Immature Granulocytes: 4 %
Lymphocytes Relative: 9 %
Lymphs Abs: 1.2 10*3/uL (ref 0.7–4.0)
MCH: 30.9 pg (ref 26.0–34.0)
MCHC: 33.8 g/dL (ref 30.0–36.0)
MCV: 91.4 fL (ref 80.0–100.0)
Monocytes Absolute: 1.2 10*3/uL — ABNORMAL HIGH (ref 0.1–1.0)
Monocytes Relative: 9 %
Neutro Abs: 9.6 10*3/uL — ABNORMAL HIGH (ref 1.7–7.7)
Neutrophils Relative %: 77 %
Platelet Count: 336 10*3/uL (ref 150–400)
RBC: 3.24 MIL/uL — ABNORMAL LOW (ref 3.87–5.11)
RDW: 17.2 % — ABNORMAL HIGH (ref 11.5–15.5)
WBC Count: 12.5 10*3/uL — ABNORMAL HIGH (ref 4.0–10.5)
nRBC: 1.1 % — ABNORMAL HIGH (ref 0.0–0.2)

## 2023-10-24 LAB — CMP (CANCER CENTER ONLY)
ALT: 16 U/L (ref 0–44)
AST: 24 U/L (ref 15–41)
Albumin: 2.9 g/dL — ABNORMAL LOW (ref 3.5–5.0)
Alkaline Phosphatase: 134 U/L — ABNORMAL HIGH (ref 38–126)
Anion gap: 14 (ref 5–15)
BUN: 11 mg/dL (ref 6–20)
CO2: 22 mmol/L (ref 22–32)
Calcium: 8.6 mg/dL — ABNORMAL LOW (ref 8.9–10.3)
Chloride: 95 mmol/L — ABNORMAL LOW (ref 98–111)
Creatinine: 0.81 mg/dL (ref 0.44–1.00)
GFR, Estimated: >60 mL/min (ref >60)
Glucose, Bld: 110 mg/dL — ABNORMAL HIGH (ref 70–99)
Potassium: 3.2 mmol/L — ABNORMAL LOW (ref 3.5–5.1)
Sodium: 131 mmol/L — ABNORMAL LOW (ref 135–145)
Total Bilirubin: 1.2 mg/dL (ref 0.0–1.2)
Total Protein: 6.5 g/dL (ref 6.5–8.1)

## 2023-10-24 MED ORDER — PALONOSETRON HCL INJECTION 0.25 MG/5ML
0.2500 mg | Freq: Once | INTRAVENOUS | Status: AC
  Administered 2023-10-24: 0.25 mg via INTRAVENOUS
  Filled 2023-10-24: qty 5

## 2023-10-24 MED ORDER — DEXAMETHASONE SODIUM PHOSPHATE 10 MG/ML IJ SOLN
10.0000 mg | Freq: Once | INTRAMUSCULAR | Status: AC
  Administered 2023-10-24: 10 mg via INTRAVENOUS
  Filled 2023-10-24: qty 1

## 2023-10-24 MED ORDER — SODIUM CHLORIDE 0.9 % IV SOLN: 750.0000 mg | Freq: Once | INTRAVENOUS | Status: DC

## 2023-10-24 MED ORDER — SODIUM CHLORIDE 0.9 % IV SOLN
100.0000 mg/m2 | Freq: Once | INTRAVENOUS | Status: AC
  Administered 2023-10-24: 224 mg via INTRAVENOUS
  Filled 2023-10-24: qty 11.2

## 2023-10-24 MED ORDER — HEPARIN SOD (PORK) LOCK FLUSH 100 UNIT/ML IV SOLN
500.0000 [IU] | Freq: Once | INTRAVENOUS | Status: AC | PRN
  Administered 2023-10-24: 500 [IU]
  Filled 2023-10-24: qty 5

## 2023-10-24 MED ORDER — SODIUM CHLORIDE 0.9 % IV SOLN
INTRAVENOUS | Status: DC
  Filled 2023-10-24: qty 250

## 2023-10-24 MED ORDER — POTASSIUM CHLORIDE 20 MEQ/100ML IV SOLN
20.0000 meq | Freq: Once | INTRAVENOUS | Status: AC
  Administered 2023-10-24: 20 meq via INTRAVENOUS

## 2023-10-24 MED ORDER — SODIUM CHLORIDE 0.9 % IV SOLN
540.0000 mg | Freq: Once | INTRAVENOUS | Status: AC
  Administered 2023-10-24: 540 mg via INTRAVENOUS
  Filled 2023-10-24: qty 54

## 2023-10-24 MED ORDER — SODIUM CHLORIDE 0.9 % IV SOLN
150.0000 mg | Freq: Once | INTRAVENOUS | Status: AC
  Administered 2023-10-24: 150 mg via INTRAVENOUS
  Filled 2023-10-24: qty 150

## 2023-10-24 NOTE — Telephone Encounter (Signed)
 FMLA form received for patient's spouse.  Filled out and pending MD signature.

## 2023-10-24 NOTE — Addendum Note (Signed)
 Addended by: Amaryllis Dyke on: 10/24/2023 10:00 AM   Modules accepted: Orders

## 2023-10-24 NOTE — Progress Notes (Signed)
 Patient said she hasn't been feeling that well after last treatment and hospital visit. She also has no appetite and has more questions that she would only like to discuss with you.

## 2023-10-24 NOTE — Progress Notes (Signed)
 Per Dr. Danella Penton  Keep OK to keep carboplatin dose at 540 mg today instead of higher 750 mg due to low Plt with last dose.   Sharen Hones, PharmD, BCPS Clinical Pharmacist

## 2023-10-24 NOTE — Patient Instructions (Addendum)
 [facility] - A DEPT OF [facility]  Discharge Instructions: Thank you for choosing [facility] to provide your oncology and hematology care.  If you have a lab appointment with the Cancer Center, please go directly to the Cancer Center and check in at the registration area.  Wear comfortable clothing and clothing appropriate for easy access to any Portacath or PICC line.   We strive to give you quality time with your provider. You may need to reschedule your appointment if you arrive late (15 or more minutes).  Arriving late affects you and other patients whose appointments are after yours.  Also, if you miss three or more appointments without notifying the office, you may be dismissed from the clinic at the provider's discretion.      For prescription refill requests, have your pharmacy contact our office and allow 72 hours for refills to be completed.    Today you received the following chemotherapy and/or immunotherapy agents CARBOPLATIN, ETOPOSIDE and POTASSIUM      To help prevent nausea and vomiting after your treatment, we encourage you to take your nausea medication as directed.  BELOW ARE SYMPTOMS THAT SHOULD BE REPORTED IMMEDIATELY: *FEVER GREATER THAN 100.4 F (38 C) OR HIGHER *CHILLS OR SWEATING *NAUSEA AND VOMITING THAT IS NOT CONTROLLED WITH YOUR NAUSEA MEDICATION *UNUSUAL SHORTNESS OF BREATH *UNUSUAL BRUISING OR BLEEDING *URINARY PROBLEMS (pain or burning when urinating, or frequent urination) *BOWEL PROBLEMS (unusual diarrhea, constipation, pain near the anus) TENDERNESS IN MOUTH AND THROAT WITH OR WITHOUT PRESENCE OF ULCERS (sore throat, sores in mouth, or a toothache) UNUSUAL RASH, SWELLING OR PAIN  UNUSUAL VAGINAL DISCHARGE OR ITCHING   Items with * indicate a potential emergency and should be followed up as soon as possible or go to the Emergency Department if any problems should occur.  Please show the CHEMOTHERAPY  ALERT CARD or IMMUNOTHERAPY ALERT CARD at check-in to the Emergency Department and triage nurse.  Should you have questions after your visit or need to cancel or reschedule your appointment, please contact [facility] - A DEPT OF [facility]  [phone] and follow the prompts.  Office hours are 8:00 a.m. to 4:30 p.m. Monday - Friday. Please note that voicemails left after 4:00 p.m. may not be returned until the following business day.  We are closed weekends and major holidays. You have access to a nurse at all times for urgent questions. Please call the main number to the clinic [phone] and follow the prompts.  For any non-urgent questions, you may also contact your provider using MyChart. We now offer e-Visits for anyone 25 and older to request care online for non-urgent symptoms. For details visit [email or web address].   Also download the MyChart app! Go to the app store, search "MyChart", open the app, select [facility], and log in with your MyChart username and password.   Carboplatin Injection What is this medication? CARBOPLATIN (KAR boe pla tin) treats some types of cancer. It works by slowing down the growth of cancer cells. This medicine may be used for other purposes; ask your health care provider or pharmacist if you have questions. COMMON BRAND NAME(S): Paraplatin What should I tell my care team before I take this medication? They need to know if you have any of these conditions: Blood disorders Hearing problems Kidney disease Recent or ongoing radiation therapy An unusual or allergic reaction to carboplatin, cisplatin, other medications, foods,  dyes, or preservatives Pregnant or trying to get pregnant Breast-feeding How should I use this medication? This medication is injected into a vein. It is given by your care team in a hospital or clinic setting. Talk to your care team about the use of this medication in children. Special care  may be needed. Overdosage: If you think you have taken too much of this medicine contact a poison control center or emergency room at once. NOTE: This medicine is only for you. Do not share this medicine with others. What if I miss a dose? Keep appointments for follow-up doses. It is important not to miss your dose. Call your care team if you are unable to keep an appointment. What may interact with this medication? Medications for seizures Some antibiotics, such as amikacin, gentamicin, neomycin, streptomycin, tobramycin Vaccines This list may not describe all possible interactions. Give your health care provider a list of all the medicines, herbs, non-prescription drugs, or dietary supplements you use. Also tell them if you smoke, drink alcohol, or use illegal drugs. Some items may interact with your medicine. What should I watch for while using this medication? Your condition will be monitored carefully while you are receiving this medication. You may need blood work while taking this medication. This medication may make you feel generally unwell. This is not uncommon, as chemotherapy can affect healthy cells as well as cancer cells. Report any side effects. Continue your course of treatment even though you feel ill unless your care team tells you to stop. In some cases, you may be given additional medications to help with side effects. Follow all directions for their use. This medication may increase your risk of getting an infection. Call your care team for advice if you get a fever, chills, sore throat, or other symptoms of a cold or flu. Do not treat yourself. Try to avoid being around people who are sick. Avoid taking medications that contain aspirin, acetaminophen, ibuprofen, naproxen, or ketoprofen unless instructed by your care team. These medications may hide a fever. Be careful brushing or flossing your teeth or using a toothpick because you may get an infection or bleed more easily. If  you have any dental work done, tell your dentist you are receiving this medication. Talk to your care team if you wish to become pregnant or think you might be pregnant. This medication can cause serious birth defects. Talk to your care team about effective forms of contraception. Do not breast-feed while taking this medication. What side effects may I notice from receiving this medication? Side effects that you should report to your care team as soon as possible: Allergic reactions--skin rash, itching, hives, swelling of the face, lips, tongue, or throat Infection--fever, chills, cough, sore throat, wounds that don't heal, pain or trouble when passing urine, general feeling of discomfort or being unwell Low red blood cell level--unusual weakness or fatigue, dizziness, headache, trouble breathing Pain, tingling, or numbness in the hands or feet, muscle weakness, change in vision, confusion or trouble speaking, loss of balance or coordination, trouble walking, seizures Unusual bruising or bleeding Side effects that usually do not require medical attention (report to your care team if they continue or are bothersome): Hair loss Nausea Unusual weakness or fatigue Vomiting This list may not describe all possible side effects. Call your doctor for medical advice about side effects. You may report side effects to FDA at 1-800-FDA-1088. Where should I keep my medication? This medication is given in a hospital or clinic. It  will not be stored at home. NOTE: This sheet is a summary. It may not cover all possible information. If you have questions about this medicine, talk to your doctor, pharmacist, or health care provider.  2024 Elsevier/Gold Standard (2021-12-01 00:00:00)  \[id number] Injection What is this medication? ETOPOSIDE (e toe POE side) treats some types of cancer. It works by slowing down the growth of cancer cells. This medicine may be used for other purposes; ask your health  care provider or pharmacist if you have questions. COMMON BRAND NAME(S): Etopophos, Toposar, VePesid What should I tell my care team before I take this medication? They need to know if you have any of these conditions: Infection Kidney disease Liver disease Low blood counts, such as low white cell, platelet, red cell counts An unusual or allergic reaction to etoposide, other medications, foods, dyes, or preservatives If you or your partner are pregnant or trying to get pregnant Breastfeeding How should I use this medication? This medication is injected into a vein. It is given by your care team in a hospital or clinic setting. Talk to your care team about the use of this medication in children. Special care may be needed. Overdosage: If you think you have taken too much of this medicine contact a poison control center or emergency room at once. NOTE: This medicine is only for you. Do not share this medicine with others. What if I miss a dose? Keep appointments for follow-up doses. It is important not to miss your dose. Call your care team if you are unable to keep an appointment. What may interact with this medication? Warfarin This list may not describe all possible interactions. Give your health care provider a list of all the medicines, herbs, non-prescription drugs, or dietary supplements you use. Also tell them if you smoke, drink alcohol, or use illegal drugs. Some items may interact with your medicine. What should I watch for while using this medication? Your condition will be monitored carefully while you are receiving this medication. This medication may make you feel generally unwell. This is not uncommon as chemotherapy can affect healthy cells as well as cancer cells. Report any side effects. Continue your course of treatment even though you feel ill unless your care team tells you to stop. This medication can cause serious side effects. To reduce the risk, your care team may give  you other medications to take before receiving this one. Be sure to follow the directions from your care team. This medication may increase your risk of getting an infection. Call your care team for advice if you get a fever, chills, sore throat, or other symptoms of a cold or flu. Do not treat yourself. Try to avoid being around people who are sick. This medication may increase your risk to bruise or bleed. Call your care team if you notice any unusual bleeding. Talk to your care team about your risk of cancer. You may be more at risk for certain types of cancers if you take this medication. Talk to your care team if you may be pregnant. Serious birth defects can occur if you take this medication during pregnancy and for 6 months after the last dose. You will need a negative pregnancy test before starting this medication. Contraception is recommended while taking this medication and for 6 months after the last dose. Your care team can help you find the option that works for you. If your partner can get pregnant, use a condom during sex while taking this  medication and for 4 months after the last dose. Do not breastfeed while taking this medication. This medication may cause infertility. Talk to your care team if you are concerned about your fertility. What side effects may I notice from receiving this medication? Side effects that you should report to your care team as soon as possible: Allergic reactions--skin rash, itching, hives, swelling of the face, lips, tongue, or throat Infection--fever, chills, cough, sore throat, wounds that don't heal, pain or trouble when passing urine, general feeling of discomfort or being unwell Low red blood cell level--unusual weakness or fatigue, dizziness, headache, trouble breathing Unusual bruising or bleeding Side effects that usually do not require medical attention (report to your care team if they continue or are bothersome): Diarrhea Fatigue Hair  loss Loss of appetite Nausea Vomiting This list may not describe all possible side effects. Call your doctor for medical advice about side effects. You may report side effects to FDA at 1-800-FDA-1088. Where should I keep my medication? This medication is given in a hospital or clinic. It will not be stored at home. NOTE: This sheet is a summary. It may not cover all possible information. If you have questions about this medicine, talk to your doctor, pharmacist, or health care provider.  2024 Elsevier/Gold Standard (2021-12-31 00:00:00)  Potassium Chloride Injection What is this medication? POTASSIUM CHLORIDE (poe TASS i um KLOOR ide) prevents and treats low levels of potassium in your body. Potassium plays an important role in maintaining the health of your kidneys, heart, muscles, and nervous system. This medicine may be used for other purposes; ask your health care provider or pharmacist if you have questions. COMMON BRAND NAME(S): PROAMP What should I tell my care team before I take this medication? They need to know if you have any of these conditions: Addison disease Dehydration Diabetes (high blood sugar) Heart disease High levels of potassium in the blood Irregular heartbeat or rhythm Kidney disease Large areas of burned skin An unusual or allergic reaction to potassium, other medications, foods, dyes, or preservatives Pregnant or trying to get pregnant Breast-feeding How should I use this medication? This medication is injected into a vein. It is given in a hospital or clinic setting. Talk to your care team about the use of this medication in children. Special care may be needed. Overdosage: If you think you have taken too much of this medicine contact a poison control center or emergency room at once. NOTE: This medicine is only for you. Do not share this medicine with others. What if I miss a dose? This does not apply. This medication is not for regular use. What may  interact with this medication? Do not take this medication with any of the following: Certain diuretics, such as spironolactone, triamterene Eplerenone Sodium polystyrene sulfonate This medication may also interact with the following: Certain medications for blood pressure or heart disease, such as lisinopril, losartan, quinapril, valsartan Medications that lower your chance of fighting infection, such as cyclosporine, tacrolimus NSAIDs, medications for pain and inflammation, such as ibuprofen or naproxen Other potassium supplements Salt substitutes This list may not describe all possible interactions. Give your health care provider a list of all the medicines, herbs, non-prescription drugs, or dietary supplements you use. Also tell them if you smoke, drink alcohol, or use illegal drugs. Some items may interact with your medicine. What should I watch for while using this medication? Visit your care team for regular checks on your progress. Tell your care team if your symptoms  do not start to get better or if they get worse. You may need blood work while you are taking this medication. Avoid salt substitutes unless you are told otherwise by your care team. What side effects may I notice from receiving this medication? Side effects that you should report to your care team as soon as possible: Allergic reactions--skin rash, itching, hives, swelling of the face, lips, tongue, or throat High potassium level--muscle weakness, fast or irregular heartbeat Side effects that usually do not require medical attention (report to your care team if they continue or are bothersome): Diarrhea Nausea Stomach pain Vomiting This list may not describe all possible side effects. Call your doctor for medical advice about side effects. You may report side effects to FDA at 1-800-FDA-1088. Where should I keep my medication? This medication is given in a hospital or clinic. It will not be stored at home. NOTE:  This sheet is a summary. It may not cover all possible information. If you have questions about this medicine, talk to your doctor, pharmacist, or health care provider.  2024 Elsevier/Gold Standard (2022-02-19 00:00:00)

## 2023-10-24 NOTE — Progress Notes (Addendum)
 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  This was followed by a CT abdomen pelvis with contrast which showed widespread metastatic disease to abdominopelvic and thoracic nodes, omentum and lungs.  Abnormal appearance of the uterus and lower uterine segment and cervix favoring endometrial or cervical primary.  Patient was seen by Dr. Johnnette Litter and underwent endometrial and cervical biopsy as well as biopsy of the retroperitoneal lymph node.  Cervical biopsy was consistent with squamous mucosa and abundant necrotic debris highly suspicious but not definitively diagnostic for malignancy.  FNA of the retroperitoneal lymph node showed poorly differentiated carcinoma with neuroendocrine features.   Comment: The following immunohistochemistry was performed after review of the clinical history and morphology to further characterize the pathologic process. The results are as follows:   A1-14  PanCK                      positive  A1-15  P16                           Positive, patchy A1-16  P53                           Normal/wild-type  A1-17  PAX8                        Focal blush positive  A1-18  ER Dx Only              negative   A1-19  Bio Neg Ctrl              Appropriately reactive control slide  A1-22  CK7                          Positive, patchy A1-23  CK20                        negative  A1-24  GATA 3                    negative  A1-25  CDX-2                      Positive, patchy A1-26  TTF-1                        negative  A1-27  INSM-1                     Positive, patchy weak A1-28SynaptophysinPositive, patchy weak A1-31  BRG-1  Favor retained (weak) nuclear staining A1-32  BAF 47                     Retained nuclear staining A1-33  P63                           negative  A1-34  Claudin-4                  Positive, patchy    Smears and cell block show a poorly differentiated carcinoma. The immunophenotype is non-specific, but shows evidence of neuroendocrine differentiation.    Family history significant for stage IV colon cancer in her mother in her 9s.  Sister had breast cancer in her 76s.  Interval history-patient denies any abdominal pain but does feel her belly is still distended.  Bowel movements are loose although not overtly watery after starting vancomycin.  No fever.  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  ECOG PS- 2 Pain scale- 0   Review of systems- Review of Systems  Constitutional:  Positive for malaise/fatigue. Negative for chills, fever and weight loss.  HENT:  Negative for congestion, ear discharge and nosebleeds.   Eyes:  Negative for blurred vision.  Respiratory:  Negative for cough, hemoptysis, sputum production, shortness of breath and wheezing.   Cardiovascular:  Negative for chest pain, palpitations, orthopnea and claudication.  Gastrointestinal:  Negative for abdominal pain, blood in stool, constipation, diarrhea, heartburn, melena, nausea and vomiting.  Genitourinary:  Negative for dysuria, flank pain, frequency, hematuria and urgency.  Musculoskeletal:  Negative for back pain, joint pain and myalgias.  Skin:   Negative for rash.  Neurological:  Negative for dizziness, tingling, focal weakness, seizures, weakness and headaches.  Endo/Heme/Allergies:  Does not bruise/bleed easily.  Psychiatric/Behavioral:  Negative for depression and suicidal ideas. The patient does not have insomnia.       No Known Allergies   Past Medical History:  Diagnosis Date   Cancer (HCC)    Coronary artery disease    GERD (gastroesophageal reflux disease)    Hypertension    Hypothyroidism    Myocardial infarction (HCC)    Thyroid disease      Past Surgical History:  Procedure Laterality Date   CARDIAC CATHETERIZATION     CESAREAN SECTION     CORONARY STENT INTERVENTION N/A 07/24/2019   Procedure: CORONARY STENT INTERVENTION;  Surgeon: Yvonne Kendall, MD;  Location: [facility] INVASIVE CV LAB;  Service: Cardiovascular;  Laterality: N/A;  RCA & CFX   IR IMAGING GUIDED PORT INSERTION  09/23/2023   LEFT HEART CATH AND CORONARY ANGIOGRAPHY N/A 07/24/2019   Procedure: LEFT HEART CATH AND CORONARY ANGIOGRAPHY;  Surgeon: Lamar Blinks, MD;  Location: [facility] INVASIVE CV LAB;  Service: Cardiovascular;  Laterality: N/A;   TONSILLECTOMY      Social History   Socioeconomic History   Marital status: Married    Spouse name: Lanika Colgate   Number of children: 1   Years of education: Not on file   Highest education level: Not on file  Occupational History   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)  Tobacco Use   Smoking status: Never   Smokeless tobacco: Never  Vaping Use   Vaping status: Never Used  Substance and Sexual Activity   Alcohol use: Not Currently    Comment: occ   Drug use: Never   Sexual activity: Not Currently  Other Topics Concern  Not on file  Social History Narrative   Not on file   Social Drivers of Health   Financial Resource Strain: Low Risk  (07/23/2019)   Overall Financial Resource Strain (CARDIA)    Difficulty of Paying Living Expenses: Not hard at all  Food Insecurity: No Food  Insecurity (10/11/2023)   Hunger Vital Sign    Worried About Running Out of Food in the Last Year: Never true    Ran Out of Food in the Last Year: Never true  Transportation Needs: No Transportation Needs (10/11/2023)   PRAPARE - Administrator, Civil Service (Medical): No    Lack of Transportation (Non-Medical): No  Physical Activity: Unknown (07/23/2019)   Exercise Vital Sign    Days of Exercise per Week: 2 days    Minutes of Exercise per Session: Not on file  Stress: No Stress Concern Present (07/23/2019)   [employer] of Occupational Health - Occupational Stress Questionnaire    Feeling of Stress : Not at all  Social Connections: Moderately Integrated (10/11/2023)   Social Connection and Isolation Panel [NHANES]    Frequency of Communication with Friends and Family: More than three times a week    Frequency of Social Gatherings with Friends and Family: Twice a week    Attends Religious Services: 1 to 4 times per year    Active Member of [employer] or Organizations: No    Attends Banker Meetings: Never    Marital Status: Married  Catering manager Violence: Not At Risk (10/11/2023)   Humiliation, Afraid, Rape, and Kick questionnaire    Fear of Current or Ex-Partner: No    Emotionally Abused: No    Physically Abused: No    Sexually Abused: No    Family History  Problem Relation Age of Onset   Atrial fibrillation Mother        Passed January 2024.   Colon cancer Mother        Diagnosed 2007   Heart Problems Father    Heart attack Father    Heart disease Father      Current Outpatient Medications:    amoxicillin (AMOXIL) 500 MG capsule, Take 2 capsules (1,000 mg total) by mouth 3 (three) times daily for 14 days., Disp: 84 capsule, Rfl: 0   ERGOCALCIFEROL PO, Take 1 capsule by mouth every 7 (seven) days., Disp: , Rfl:    lidocaine-prilocaine (EMLA) cream, Apply to affected area once, Disp: 30 g, Rfl: 3   losartan (COZAAR) 25 MG tablet, Take 50 mg by  mouth daily., Disp: , Rfl:    metoprolol tartrate (LOPRESSOR) 25 MG tablet, Take 0.5 tablets (12.5 mg total) by mouth 2 (two) times daily., Disp: 15 tablet, Rfl: 0   Multiple Vitamin (MULTI-VITAMIN) tablet, Take 1 tablet by mouth daily., Disp: , Rfl:    ondansetron (ZOFRAN) 8 MG tablet, Take 1 tablet (8 mg total) by mouth every 8 (eight) hours as needed for nausea, vomiting or refractory nausea / vomiting. Start on the third day after carboplatin., Disp: 30 tablet, Rfl: 1   prochlorperazine (COMPAZINE) 10 MG tablet, Take 1 tablet (10 mg total) by mouth every 6 (six) hours as needed for nausea or vomiting., Disp: 30 tablet, Rfl: 1   sodium chloride 1 g tablet, Take 1 tablet (1 g total) by mouth 2 (two) times daily with a meal., Disp: 60 tablet, Rfl: 1   thyroid (ARMOUR) 120 MG tablet, Take 60 mg by mouth daily before breakfast., Disp: , Rfl:  vancomycin (VANCOCIN) 125 MG capsule, Take 1 capsule (125 mg total) by mouth 4 (four) times daily. Take one capsule by mouth fours times a day until 10/16/23 then starting 2/24 take one capsule once a day until complete amoxicillin therapy (on 10/29/2023), Disp: 20 capsule, Rfl: 0   Vitamin D, Ergocalciferol, (DRISDOL) 1.25 MG (50000 UNIT) CAPS capsule, Take 50,000 Units by mouth once a week., Disp: , Rfl:  No current facility-administered medications for this visit.  Facility-Administered Medications Ordered in Other Visits:    0.9 %  sodium chloride infusion, , Intravenous, Continuous, Creig Hines, MD, Last Rate: 10 mL/hr at 10/24/23 0938, New Bag at 10/24/23 0938   CARBOplatin (PARAPLATIN) 540 mg in sodium chloride 0.9 % 250 mL chemo infusion, 540 mg, Intravenous, Once, Creig Hines, MD   etoposide (VEPESID) 224 mg in sodium chloride 0.9 % 1,000 mL chemo infusion, 100 mg/m2 (Order-Specific), Intravenous, Once, Creig Hines, MD   heparin lock flush 100 unit/mL, 500 Units, Intracatheter, Once PRN, Creig Hines, MD   potassium chloride 20 mEq in 100 mL  IVPB, 20 mEq, Intravenous, Once, Creig Hines, MD, Last Rate: 100 mL/hr at 10/24/23 1006, 20 mEq at 10/24/23 1006  Physical exam:  Vitals:   10/24/23 0839  BP: (!) 147/81  Pulse: 95  Resp: 18  Temp: (!) 96.5 F (35.8 C)  TempSrc: Tympanic  SpO2: 98%  Weight: 257 lb (116.6 kg)   Physical Exam Constitutional:      Comments: Sitting in a wheelchair.  Appears in no acute distress  Cardiovascular:     Rate and Rhythm: Normal rate and regular rhythm.     Heart sounds: Normal heart sounds.  Pulmonary:     Effort: Pulmonary effort is normal.     Breath sounds: Normal breath sounds.  Abdominal:     Comments: Difficult to ascertain due to obesity  Musculoskeletal:     Right lower leg: Edema present.     Left lower leg: Edema present.  Skin:    General: Skin is warm and dry.  Neurological:     Mental Status: She is alert and oriented to person, place, and time.         Latest Ref Rng & Units 10/24/2023    8:15 AM  CMP  Glucose 70 - 99 mg/dL 409   BUN 6 - 20 mg/dL 11   Creatinine 8.11 - 1.00 mg/dL 9.14   Sodium 782 - 956 mmol/L 131   Potassium 3.5 - 5.1 mmol/L 3.2   Chloride 98 - 111 mmol/L 95   CO2 22 - 32 mmol/L 22   Calcium 8.9 - 10.3 mg/dL 8.6   Total Protein 6.5 - 8.1 g/dL 6.5   Total Bilirubin 0.0 - 1.2 mg/dL 1.2   Alkaline Phos 38 - 126 U/L 134   AST 15 - 41 U/L 24   ALT 0 - 44 U/L 16       Latest Ref Rng & Units 10/24/2023    8:15 AM  CBC  WBC 4.0 - 10.5 K/uL 12.5   Hemoglobin 12.0 - 15.0 g/dL 21.3   Hematocrit 08.6 - 46.0 % 29.6   Platelets 150 - 400 K/uL 336     No images are attached to the encounter.  US PELVIC COMPLETE WITH TRANSVAGINAL Result Date: 10/12/2023 CLINICAL DATA:  Endometrial carcinoma EXAM: TRANSABDOMINAL AND TRANSVAGINAL ULTRASOUND OF PELVIS TECHNIQUE: Both transabdominal and transvaginal ultrasound examinations of the pelvis were performed. Transabdominal technique was performed for global imaging  of the pelvis including uterus, ovaries,  adnexal regions, and pelvic cul-de-sac. It was necessary to proceed with endovaginal exam following the transabdominal exam to visualize the uterus. COMPARISON:  CT 10/10/2023 FINDINGS: Uterus Measurements: 11.7 by 6.8 x 6.4 cm = volume: 268 mL. Poorly visible due to habitus and limited imaging window, artifact from air in the vagina. Endometrium Thickness: About 21.6 mm.  Poorly visible Right ovary Not visualized Left ovary Not visualized. Other findings No abnormal free fluid. IMPRESSION: 1. Very limited exam secondary to habitus and shadowing artifact, possibly from air within the vagina. Endometrium appears thickened up to 21.6 mm but is very poorly visualized. 2. Nonvisualized ovaries. Electronically Signed   By: Jasmine Pang M.D.   On: 10/12/2023 17:16   CT Angio Chest Pulmonary Embolism (PE) W or WO Contrast Result Date: 10/10/2023 CLINICAL DATA:  Chest and abdominal pain. Concern for pulmonary embolism. Shortness of breath. History of metastatic disease for cervical cancer and chemotherapy. EXAM: CT ANGIOGRAPHY CHEST CT ABDOMEN AND PELVIS WITH CONTRAST TECHNIQUE: Multidetector CT imaging of the chest was performed using the standard protocol during bolus administration of intravenous contrast. Multiplanar CT image reconstructions and MIPs were obtained to evaluate the vascular anatomy. Multidetector CT imaging of the abdomen and pelvis was performed using the standard protocol during bolus administration of intravenous contrast. RADIATION DOSE REDUCTION: This exam was performed according to the departmental dose-optimization program which includes automated exposure control, adjustment of the mA and/or kV according to patient size and/or use of iterative reconstruction technique. CONTRAST:  OMNIPAQUE IOHEXOL 350 MG/ML SOLN COMPARISON:  Chest radiograph dated 10/10/2023. CT abdomen pelvis dated 08/29/2023. FINDINGS: CTA CHEST FINDINGS Cardiovascular: There is no cardiomegaly or pericardial effusion.  Three-vessel coronary vascular calcification. Right-sided Port-A-Cath with tip at the cavoatrial junction. Mild atherosclerotic calcification of the thoracic aorta. No aneurysmal dilatation or dissection. The origins of the great vessels of the aortic arch appear patent. No pulmonary artery embolus identified. Mediastinum/Nodes: Top-normal left hilar lymph nodes measure up to 1 cm. Posterior mediastinal adenopathy measure approximately 2 cm in short axis. Top-normal lymph node to the left of the trachea at the level of the thoracic inlet measures 9 mm short axis. Rounded left axillary and subpectoral lymph nodes measure up to 9 mm. The esophagus is grossly unremarkable. No mediastinal fluid collection. Lungs/Pleura: Faint diffuse hazy density throughout the lungs, likely atelectasis. No consolidative changes. There is no pleural effusion or pneumothorax. The central airways are patent. Musculoskeletal: Degenerative changes of the spine. No acute osseous pathology. Review of the MIP images confirms the above findings. CT ABDOMEN and PELVIS FINDINGS No intra-abdominal free air.  Small ascites. Hepatobiliary: The liver is unremarkable. No biliary dilatation. The gallbladder is unremarkable. Pancreas: Unremarkable. No pancreatic ductal dilatation or surrounding inflammatory changes. Spleen: Normal in size without focal abnormality. Adrenals/Urinary Tract: The adrenal glands are unremarkable. There is mild bilateral hydronephrosis relatively similar to prior CT. The visualized ureters appear unremarkable. The urinary bladder is minimally distended. Stomach/Bowel: Mildly dilated fluid-filled loops of small bowel suggestive of enteritis. Gradual transition likely represents a degree of ileus. No definite obstruction. Mild sigmoid diverticulosis. There is loose stool throughout the colon consistent with diarrheal state. The appendix is unremarkable. Vascular/Lymphatic: Mild aortoiliac atherosclerotic disease. The IVC is  unremarkable. No portal venous gas. Retroperitoneal adenopathy relatively similar to prior CT. Reproductive: The uterus is anteverted. Air is noted in the vagina. The ovaries are grossly unremarkable. Other: There is slight progression of omental implants and nodularity since the  prior CT. Musculoskeletal: Diffuse subcutaneous edema of the pelvic wall. L1 compression fracture with greater than 50% loss of vertebral body height, new since the prior CT, likely acute or subacute. There is approximately 4 mm retropulsion of the inferior posterior cortex. There is disc desiccation and vacuum phenomena at L1-L2. Review of the MIP images confirms the above findings. IMPRESSION: 1. No acute intrathoracic pathology. No CT evidence of pulmonary artery embolus. 2. Probable enteritis with associated ileus and diarrheal state. Clinical correlation is recommended. 3. Mild bilateral hydronephrosis relatively similar to prior CT. 4. Slight progression of omental implants and nodularity since the prior CT. 5. L1 compression fracture, new since the prior CT, likely acute or subacute. 6.  Aortic Atherosclerosis (ICD10-I70.0). Electronically Signed   By: Elgie Collard M.D.   On: 10/10/2023 20:02   CT ABDOMEN PELVIS W CONTRAST Result Date: 10/10/2023 CLINICAL DATA:  Chest and abdominal pain. Concern for pulmonary embolism. Shortness of breath. History of metastatic disease for cervical cancer and chemotherapy. EXAM: CT ANGIOGRAPHY CHEST CT ABDOMEN AND PELVIS WITH CONTRAST TECHNIQUE: Multidetector CT imaging of the chest was performed using the standard protocol during bolus administration of intravenous contrast. Multiplanar CT image reconstructions and MIPs were obtained to evaluate the vascular anatomy. Multidetector CT imaging of the abdomen and pelvis was performed using the standard protocol during bolus administration of intravenous contrast. RADIATION DOSE REDUCTION: This exam was performed according to the departmental  dose-optimization program which includes automated exposure control, adjustment of the mA and/or kV according to patient size and/or use of iterative reconstruction technique. CONTRAST:  OMNIPAQUE IOHEXOL 350 MG/ML SOLN COMPARISON:  Chest radiograph dated 10/10/2023. CT abdomen pelvis dated 08/29/2023. FINDINGS: CTA CHEST FINDINGS Cardiovascular: There is no cardiomegaly or pericardial effusion. Three-vessel coronary vascular calcification. Right-sided Port-A-Cath with tip at the cavoatrial junction. Mild atherosclerotic calcification of the thoracic aorta. No aneurysmal dilatation or dissection. The origins of the great vessels of the aortic arch appear patent. No pulmonary artery embolus identified. Mediastinum/Nodes: Top-normal left hilar lymph nodes measure up to 1 cm. Posterior mediastinal adenopathy measure approximately 2 cm in short axis. Top-normal lymph node to the left of the trachea at the level of the thoracic inlet measures 9 mm short axis. Rounded left axillary and subpectoral lymph nodes measure up to 9 mm. The esophagus is grossly unremarkable. No mediastinal fluid collection. Lungs/Pleura: Faint diffuse hazy density throughout the lungs, likely atelectasis. No consolidative changes. There is no pleural effusion or pneumothorax. The central airways are patent. Musculoskeletal: Degenerative changes of the spine. No acute osseous pathology. Review of the MIP images confirms the above findings. CT ABDOMEN and PELVIS FINDINGS No intra-abdominal free air.  Small ascites. Hepatobiliary: The liver is unremarkable. No biliary dilatation. The gallbladder is unremarkable. Pancreas: Unremarkable. No pancreatic ductal dilatation or surrounding inflammatory changes. Spleen: Normal in size without focal abnormality. Adrenals/Urinary Tract: The adrenal glands are unremarkable. There is mild bilateral hydronephrosis relatively similar to prior CT. The visualized ureters appear unremarkable. The urinary  bladder is minimally distended. Stomach/Bowel: Mildly dilated fluid-filled loops of small bowel suggestive of enteritis. Gradual transition likely represents a degree of ileus. No definite obstruction. Mild sigmoid diverticulosis. There is loose stool throughout the colon consistent with diarrheal state. The appendix is unremarkable. Vascular/Lymphatic: Mild aortoiliac atherosclerotic disease. The IVC is unremarkable. No portal venous gas. Retroperitoneal adenopathy relatively similar to prior CT. Reproductive: The uterus is anteverted. Air is noted in the vagina. The ovaries are grossly unremarkable. Other: There is slight progression of  omental implants and nodularity since the prior CT. Musculoskeletal: Diffuse subcutaneous edema of the pelvic wall. L1 compression fracture with greater than 50% loss of vertebral body height, new since the prior CT, likely acute or subacute. There is approximately 4 mm retropulsion of the inferior posterior cortex. There is disc desiccation and vacuum phenomena at L1-L2. Review of the MIP images confirms the above findings. IMPRESSION: 1. No acute intrathoracic pathology. No CT evidence of pulmonary artery embolus. 2. Probable enteritis with associated ileus and diarrheal state. Clinical correlation is recommended. 3. Mild bilateral hydronephrosis relatively similar to prior CT. 4. Slight progression of omental implants and nodularity since the prior CT. 5. L1 compression fracture, new since the prior CT, likely acute or subacute. 6.  Aortic Atherosclerosis (ICD10-I70.0). Electronically Signed   By: Elgie Collard M.D.   On: 10/10/2023 20:02   DG Chest 2 View Result Date: 10/10/2023 CLINICAL DATA:  Shortness of breath EXAM: CHEST - 2 VIEW COMPARISON:  Chest radiograph dated 10/02/2023, CT abdomen and pelvis dated 08/29/2023 FINDINGS: Lines/tubes: Right chest wall port tip projects over the superior cavoatrial junction. Lungs: Well inflated lungs. Patchy opacity projecting  over the left costophrenic angle. Pleura: No pneumothorax or pleural effusion. Heart/mediastinum: The heart size and mediastinal contours are within normal limits. Bones: Compression deformity of approximately L1, new from 08/29/2023. IMPRESSION: 1. Patchy opacity projecting over the left costophrenic angle, which may represent atelectasis or pneumonia. 2. New approximately L1 compression deformity. Electronically Signed   By: Agustin Cree M.D.   On: 10/10/2023 15:45   DG Chest Port 1 View Result Date: 10/02/2023 CLINICAL DATA:  Diarrhea. Recently started chemotherapy for cervical cancer. EXAM: PORTABLE CHEST 1 VIEW COMPARISON:  Chest x-ray dated July 23, 2019. FINDINGS: Right chest wall port catheter with tip at the cavoatrial junction. The heart size and mediastinal contours are within normal limits. Normal pulmonary vascularity. No focal consolidation, pleural effusion, or pneumothorax. No acute osseous abnormality. IMPRESSION: No active disease. Electronically Signed   By: Obie Dredge M.D.   On: 10/02/2023 16:14     Assessment and plan- Patient is a 58 y.o. 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  She is here for on treatment assessment prior to cycle 2 of carbo etoposide chemotherapy  Counts okay to proceed with cycle 2 of carbo etoposide chemotherapy today.  She will receive etoposide in 2 days tomorrow and day after.  She will also receive growth factor support with every cycle.  Plan is to repeat scans after 3 cycles.  If she has excellent response to treatment we will try to give her 6 cycles of chemotherapy if tolerated.  Tecentriq was not approved by her insurance and therefore will not be given.  AKI improved.  Continue to monitor  C. difficile: Patient is presently on oral vancomycin and will complete her course in 1 week.  We are not checking her stool again as it can be false positive.  Based on her  clinical symptoms although she still has some loose stools she denies any overt watery diarrhea and it is an improvement as compared to when she was in the hospital.  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  There was some concern for air in the vagina noted on CT scan and pelvic ultrasound was not interpretable due to  her body habitus.  She was also seen by ID and plan is to do 2 weeks of amoxicillin which she will be finishing sometime this week per patient.  Hypokalemia: She will get 20 mill equivalents of IV potassium today  Bilateral lower extremity edema: Secondary to hypoalbuminemia in the setting of malignancy.  She will be using compression stockings as tolerated  She will be seen by covering NP in 10 days to see how she is doing with the present treatment.  I will see her back in 3 weeks for cycle 3 of chemotherapy   Visit Diagnosis 1. 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 Encounter for antineoplastic chemotherapy   3. Antineoplastic chemotherapy induced anemia   4. Hypokalemia      Dr. Owens Shark, MD, MPH [facility] [phone] 10/24/2023 10:51 AM

## 2023-10-25 ENCOUNTER — Other Ambulatory Visit: Payer: Self-pay

## 2023-10-25 ENCOUNTER — Inpatient Hospital Stay: Payer: BC Managed Care – PPO

## 2023-10-25 VITALS — BP 146/83 | HR 81 | Temp 96.7°F | Resp 16

## 2023-10-25 DIAGNOSIS — C7A8 Other malignant neuroendocrine tumors: Secondary | ICD-10-CM

## 2023-10-25 DIAGNOSIS — Z5111 Encounter for antineoplastic chemotherapy: Secondary | ICD-10-CM | POA: Diagnosis not present

## 2023-10-25 MED ORDER — HEPARIN SOD (PORK) LOCK FLUSH 100 UNIT/ML IV SOLN
500.0000 [IU] | Freq: Once | INTRAVENOUS | Status: AC | PRN
  Administered 2023-10-25: 500 [IU]
  Filled 2023-10-25: qty 5

## 2023-10-25 MED ORDER — SODIUM CHLORIDE 0.9 % IV SOLN
100.0000 mg/m2 | Freq: Once | INTRAVENOUS | Status: AC
  Administered 2023-10-25: 224 mg via INTRAVENOUS
  Filled 2023-10-25: qty 11.2

## 2023-10-25 MED ORDER — DEXAMETHASONE SODIUM PHOSPHATE 10 MG/ML IJ SOLN
10.0000 mg | Freq: Once | INTRAMUSCULAR | Status: AC
  Administered 2023-10-25: 10 mg via INTRAVENOUS
  Filled 2023-10-25: qty 1

## 2023-10-25 MED ORDER — SODIUM CHLORIDE 0.9 % IV SOLN
INTRAVENOUS | Status: DC
  Filled 2023-10-25: qty 250

## 2023-10-25 NOTE — Progress Notes (Signed)
 While receiving Etoposide today patient began saying that her mouth felt dry and noticed an increase of sputum production. Etoposide stopped and vital signs checked. Vitals stable and no other symptoms present. Made Dr. Smith Robert and Ivin Booty Borders aware. Per Dr. Smith Robert, if vitals are stable restart Etoposide. Before restarting, patient asked to move around and go to the bathroom After returning, symptoms had improved. Etoposide restarted and patient completed medication without complications. Vital signs stable at discharge

## 2023-10-26 ENCOUNTER — Inpatient Hospital Stay: Payer: BC Managed Care – PPO

## 2023-10-26 ENCOUNTER — Encounter: Payer: Self-pay | Admitting: Oncology

## 2023-10-26 VITALS — BP 163/80 | HR 83 | Temp 98.2°F | Resp 18

## 2023-10-26 DIAGNOSIS — Z5111 Encounter for antineoplastic chemotherapy: Secondary | ICD-10-CM | POA: Diagnosis not present

## 2023-10-26 DIAGNOSIS — C7B8 Other secondary neuroendocrine tumors: Secondary | ICD-10-CM

## 2023-10-26 MED ORDER — SODIUM CHLORIDE 0.9 % IV SOLN
INTRAVENOUS | Status: DC
  Filled 2023-10-26: qty 250

## 2023-10-26 MED ORDER — HEPARIN SOD (PORK) LOCK FLUSH 100 UNIT/ML IV SOLN
500.0000 [IU] | Freq: Once | INTRAVENOUS | Status: DC | PRN
  Filled 2023-10-26: qty 5

## 2023-10-26 MED ORDER — SODIUM CHLORIDE 0.9 % IV SOLN
100.0000 mg/m2 | Freq: Once | INTRAVENOUS | Status: AC
  Administered 2023-10-26: 224 mg via INTRAVENOUS
  Filled 2023-10-26: qty 11.2

## 2023-10-26 MED ORDER — DEXAMETHASONE SODIUM PHOSPHATE 10 MG/ML IJ SOLN
10.0000 mg | Freq: Once | INTRAMUSCULAR | Status: AC
  Administered 2023-10-26: 10 mg via INTRAVENOUS

## 2023-10-26 NOTE — Progress Notes (Signed)
 Nutrition Assessment   Reason for Assessment:   New chemotherapy, poor appetite   ASSESSMENT:   58 year old female with stage IV endometrium/cervix with neuroendocrine features.  Past medical history of DM, hypothyroidism, MI, obesity, GERD, CAD.  Hospital admission for c-diff and sepsis.  Receiving carboplatin and etoposide.    Met with patient during infusion. Reports that she is having issues with dry mouth even before starting treatment.  Also reports overly sweet taste, taste alterations.  Overall appetite is decreased since before diagnosis, especially does not want to eat meats, toast, crackers due to dryness.  Has been able to drink 2 ensure max protein shakes each day. Also drinking bone broth.  Reports having excess gas.     Nutrition Focused Physical Exam:   Swelling in feet and ankles.     Medications: zofran, compazine, MVI, Vit D, salt tabs   Labs: Na 131, K 3.2, glucose 110, calcium 8.6, albumin 2.9   Anthropometrics:   Height: 64 inches Weight: 257 lb on 3/5 (swelling effecting weight) 242 lb on 09/21/23 BMI: 44   Estimated Energy Needs  Kcals: 2500 Protein: 125 g Fluid: 2.5 L   NUTRITION DIAGNOSIS: Inadequate oral intake related to cancer and related treatment side effects as evidenced by poor po intake for > 1 month and taste alterations    INTERVENTION:  Discussed strategies to help with taste change. Handout provided Discussed strategies to help with dry mouth. Handout provided Encouraged foods rich in protein. Continue MVI daily Continue oral nutrition supplement at least BID Contact information provided   MONITORING, EVALUATION, GOAL: weight trends, intake   Next Visit: Wednesday, March 26 during infusion  Clark Cuff B. Freida Busman, RD, [name] Registered Dietitian [phone]

## 2023-10-26 NOTE — Patient Instructions (Signed)
 [facility] - A DEPT OF [facility]  Discharge Instructions: Thank you for choosing [facility] to provide your oncology and hematology care.  If you have a lab appointment with the Cancer Center, please go directly to the Cancer Center and check in at the registration area.  Wear comfortable clothing and clothing appropriate for easy access to any Portacath or PICC line.   We strive to give you quality time with your provider. You may need to reschedule your appointment if you arrive late (15 or more minutes).  Arriving late affects you and other patients whose appointments are after yours.  Also, if you miss three or more appointments without notifying the office, you may be dismissed from the clinic at the provider's discretion.      For prescription refill requests, have your pharmacy contact our office and allow 72 hours for refills to be completed.    Today you received the following chemotherapy and/or immunotherapy agents ETOPOSIDE      To help prevent nausea and vomiting after your treatment, we encourage you to take your nausea medication as directed.  BELOW ARE SYMPTOMS THAT SHOULD BE REPORTED IMMEDIATELY: *FEVER GREATER THAN 100.4 F (38 C) OR HIGHER *CHILLS OR SWEATING *NAUSEA AND VOMITING THAT IS NOT CONTROLLED WITH YOUR NAUSEA MEDICATION *UNUSUAL SHORTNESS OF BREATH *UNUSUAL BRUISING OR BLEEDING *URINARY PROBLEMS (pain or burning when urinating, or frequent urination) *BOWEL PROBLEMS (unusual diarrhea, constipation, pain near the anus) TENDERNESS IN MOUTH AND THROAT WITH OR WITHOUT PRESENCE OF ULCERS (sore throat, sores in mouth, or a toothache) UNUSUAL RASH, SWELLING OR PAIN  UNUSUAL VAGINAL DISCHARGE OR ITCHING   Items with * indicate a potential emergency and should be followed up as soon as possible or go to the Emergency Department if any problems should occur.  Please show the CHEMOTHERAPY ALERT CARD or IMMUNOTHERAPY  ALERT CARD at check-in to the Emergency Department and triage nurse.  Should you have questions after your visit or need to cancel or reschedule your appointment, please contact [facility] - A DEPT OF [facility]  [phone] and follow the prompts.  Office hours are 8:00 a.m. to 4:30 p.m. Monday - Friday. Please note that voicemails left after 4:00 p.m. may not be returned until the following business day.  We are closed weekends and major holidays. You have access to a nurse at all times for urgent questions. Please call the main number to the clinic [phone] and follow the prompts.  For any non-urgent questions, you may also contact your provider using MyChart. We now offer e-Visits for anyone 58 and older to request care online for non-urgent symptoms. For details visit [email or web address].   Also download the MyChart app! Go to the app store, search "MyChart", open the app, select [place], and log in with your MyChart username and password.  Etoposide Injection What is this medication? ETOPOSIDE (e toe POE side) treats some types of cancer. It works by slowing down the growth of cancer cells. This medicine may be used for other purposes; ask your health care provider or pharmacist if you have questions. COMMON BRAND NAME(S): Etopophos, Toposar, VePesid What should I tell my care team before I take this medication? They need to know if you have any of these conditions: Infection Kidney disease Liver disease Low blood counts, such as low white cell, platelet, red cell counts An unusual or allergic reaction to etoposide,  other medications, foods, dyes, or preservatives If you or your partner are pregnant or trying to get pregnant Breastfeeding How should I use this medication? This medication is injected into a vein. It is given by your care team in a hospital or clinic setting. Talk to your care team about the use of this medication in  children. Special care may be needed. Overdosage: If you think you have taken too much of this medicine contact a poison control center or emergency room at once. NOTE: This medicine is only for you. Do not share this medicine with others. What if I miss a dose? Keep appointments for follow-up doses. It is important not to miss your dose. Call your care team if you are unable to keep an appointment. What may interact with this medication? Warfarin This list may not describe all possible interactions. Give your health care provider a list of all the medicines, herbs, non-prescription drugs, or dietary supplements you use. Also tell them if you smoke, drink alcohol, or use illegal drugs. Some items may interact with your medicine. What should I watch for while using this medication? Your condition will be monitored carefully while you are receiving this medication. This medication may make you feel generally unwell. This is not uncommon as chemotherapy can affect healthy cells as well as cancer cells. Report any side effects. Continue your course of treatment even though you feel ill unless your care team tells you to stop. This medication can cause serious side effects. To reduce the risk, your care team may give you other medications to take before receiving this one. Be sure to follow the directions from your care team. This medication may increase your risk of getting an infection. Call your care team for advice if you get a fever, chills, sore throat, or other symptoms of a cold or flu. Do not treat yourself. Try to avoid being around people who are sick. This medication may increase your risk to bruise or bleed. Call your care team if you notice any unusual bleeding. Talk to your care team about your risk of cancer. You may be more at risk for certain types of cancers if you take this medication. Talk to your care team if you may be pregnant. Serious birth defects can occur if you take this  medication during pregnancy and for 6 months after the last dose. You will need a negative pregnancy test before starting this medication. Contraception is recommended while taking this medication and for 6 months after the last dose. Your care team can help you find the option that works for you. If your partner can get pregnant, use a condom during sex while taking this medication and for 4 months after the last dose. Do not breastfeed while taking this medication. This medication may cause infertility. Talk to your care team if you are concerned about your fertility. What side effects may I notice from receiving this medication? Side effects that you should report to your care team as soon as possible: Allergic reactions--skin rash, itching, hives, swelling of the face, lips, tongue, or throat Infection--fever, chills, cough, sore throat, wounds that don't heal, pain or trouble when passing urine, general feeling of discomfort or being unwell Low red blood cell level--unusual weakness or fatigue, dizziness, headache, trouble breathing Unusual bruising or bleeding Side effects that usually do not require medical attention (report to your care team if they continue or are bothersome): Diarrhea Fatigue Hair loss Loss of appetite Nausea Vomiting This list may  not describe all possible side effects. Call your doctor for medical advice about side effects. You may report side effects to FDA at 1-800-FDA-1088. Where should I keep my medication? This medication is given in a hospital or clinic. It will not be stored at home. NOTE: This sheet is a summary. It may not cover all possible information. If you have questions about this medicine, talk to your doctor, pharmacist, or health care provider.  2024 Elsevier/Gold Standard (2021-12-31 00:00:00)

## 2023-10-26 NOTE — Telephone Encounter (Signed)
 Completed FMLA for faxed to company, copy to be scanned in chart, and copy downstairs for patient pick up. Patient notified

## 2023-10-28 ENCOUNTER — Inpatient Hospital Stay: Payer: BC Managed Care – PPO

## 2023-10-28 ENCOUNTER — Inpatient Hospital Stay

## 2023-10-28 ENCOUNTER — Other Ambulatory Visit: Payer: BC Managed Care – PPO

## 2023-10-28 DIAGNOSIS — C7A8 Other malignant neuroendocrine tumors: Secondary | ICD-10-CM

## 2023-10-28 DIAGNOSIS — Z5111 Encounter for antineoplastic chemotherapy: Secondary | ICD-10-CM | POA: Diagnosis not present

## 2023-10-28 MED ORDER — PEGFILGRASTIM-JMDB 6 MG/0.6ML ~~LOC~~ SOSY
6.0000 mg | PREFILLED_SYRINGE | Freq: Once | SUBCUTANEOUS | Status: AC
  Administered 2023-10-28: 6 mg via SUBCUTANEOUS
  Filled 2023-10-28: qty 0.6

## 2023-11-04 ENCOUNTER — Other Ambulatory Visit: Payer: Self-pay

## 2023-11-04 ENCOUNTER — Inpatient Hospital Stay

## 2023-11-04 ENCOUNTER — Inpatient Hospital Stay: Admitting: Nurse Practitioner

## 2023-11-04 ENCOUNTER — Encounter: Payer: Self-pay | Admitting: Nurse Practitioner

## 2023-11-04 ENCOUNTER — Inpatient Hospital Stay
Admission: EM | Admit: 2023-11-04 | Discharge: 2023-11-07 | DRG: 809 | Disposition: A | Discharge status: Home Health | Source: Ambulatory Visit | Attending: Internal Medicine | Admitting: Internal Medicine

## 2023-11-04 ENCOUNTER — Emergency Department

## 2023-11-04 VITALS — BP 130/72 | HR 125 | Temp 98.4°F | Resp 18 | Wt 250.0 lb

## 2023-11-04 DIAGNOSIS — I252 Old myocardial infarction: Secondary | ICD-10-CM

## 2023-11-04 DIAGNOSIS — Z6841 Body Mass Index (BMI) 40.0 and over, adult: Secondary | ICD-10-CM

## 2023-11-04 DIAGNOSIS — C7B8 Other secondary neuroendocrine tumors: Secondary | ICD-10-CM | POA: Diagnosis present

## 2023-11-04 DIAGNOSIS — I5032 Chronic diastolic (congestive) heart failure: Secondary | ICD-10-CM | POA: Diagnosis present

## 2023-11-04 DIAGNOSIS — D696 Thrombocytopenia, unspecified: Secondary | ICD-10-CM

## 2023-11-04 DIAGNOSIS — L304 Erythema intertrigo: Secondary | ICD-10-CM | POA: Diagnosis present

## 2023-11-04 DIAGNOSIS — N1330 Unspecified hydronephrosis: Secondary | ICD-10-CM | POA: Diagnosis present

## 2023-11-04 DIAGNOSIS — D649 Anemia, unspecified: Secondary | ICD-10-CM | POA: Diagnosis not present

## 2023-11-04 DIAGNOSIS — Z8542 Personal history of malignant neoplasm of other parts of uterus: Secondary | ICD-10-CM

## 2023-11-04 DIAGNOSIS — T451X5A Adverse effect of antineoplastic and immunosuppressive drugs, initial encounter: Secondary | ICD-10-CM | POA: Diagnosis present

## 2023-11-04 DIAGNOSIS — D701 Agranulocytosis secondary to cancer chemotherapy: Secondary | ICD-10-CM

## 2023-11-04 DIAGNOSIS — D61810 Antineoplastic chemotherapy induced pancytopenia: Secondary | ICD-10-CM | POA: Diagnosis not present

## 2023-11-04 DIAGNOSIS — D709 Neutropenia, unspecified: Secondary | ICD-10-CM | POA: Diagnosis present

## 2023-11-04 DIAGNOSIS — I110 Hypertensive heart disease with heart failure: Secondary | ICD-10-CM | POA: Diagnosis present

## 2023-11-04 DIAGNOSIS — E222 Syndrome of inappropriate secretion of antidiuretic hormone: Secondary | ICD-10-CM | POA: Diagnosis present

## 2023-11-04 DIAGNOSIS — K219 Gastro-esophageal reflux disease without esophagitis: Secondary | ICD-10-CM | POA: Diagnosis present

## 2023-11-04 DIAGNOSIS — D61818 Other pancytopenia: Secondary | ICD-10-CM | POA: Diagnosis present

## 2023-11-04 DIAGNOSIS — C786 Secondary malignant neoplasm of retroperitoneum and peritoneum: Secondary | ICD-10-CM | POA: Diagnosis present

## 2023-11-04 DIAGNOSIS — B954 Other streptococcus as the cause of diseases classified elsewhere: Secondary | ICD-10-CM | POA: Diagnosis not present

## 2023-11-04 DIAGNOSIS — E039 Hypothyroidism, unspecified: Secondary | ICD-10-CM | POA: Diagnosis present

## 2023-11-04 DIAGNOSIS — I25119 Atherosclerotic heart disease of native coronary artery with unspecified angina pectoris: Secondary | ICD-10-CM | POA: Diagnosis present

## 2023-11-04 DIAGNOSIS — R7881 Bacteremia: Secondary | ICD-10-CM | POA: Diagnosis present

## 2023-11-04 DIAGNOSIS — E1151 Type 2 diabetes mellitus with diabetic peripheral angiopathy without gangrene: Secondary | ICD-10-CM | POA: Diagnosis present

## 2023-11-04 DIAGNOSIS — C779 Secondary and unspecified malignant neoplasm of lymph node, unspecified: Secondary | ICD-10-CM | POA: Diagnosis present

## 2023-11-04 DIAGNOSIS — I255 Ischemic cardiomyopathy: Secondary | ICD-10-CM | POA: Diagnosis not present

## 2023-11-04 DIAGNOSIS — Z955 Presence of coronary angioplasty implant and graft: Secondary | ICD-10-CM

## 2023-11-04 DIAGNOSIS — Z1152 Encounter for screening for COVID-19: Secondary | ICD-10-CM

## 2023-11-04 DIAGNOSIS — I2510 Atherosclerotic heart disease of native coronary artery without angina pectoris: Secondary | ICD-10-CM | POA: Diagnosis not present

## 2023-11-04 DIAGNOSIS — C541 Malignant neoplasm of endometrium: Secondary | ICD-10-CM | POA: Diagnosis present

## 2023-11-04 DIAGNOSIS — B957 Other staphylococcus as the cause of diseases classified elsewhere: Secondary | ICD-10-CM | POA: Diagnosis present

## 2023-11-04 DIAGNOSIS — A419 Sepsis, unspecified organism: Secondary | ICD-10-CM | POA: Diagnosis present

## 2023-11-04 DIAGNOSIS — R079 Chest pain, unspecified: Secondary | ICD-10-CM

## 2023-11-04 DIAGNOSIS — E119 Type 2 diabetes mellitus without complications: Secondary | ICD-10-CM | POA: Diagnosis present

## 2023-11-04 DIAGNOSIS — E871 Hypo-osmolality and hyponatremia: Secondary | ICD-10-CM | POA: Diagnosis present

## 2023-11-04 DIAGNOSIS — R5081 Fever presenting with conditions classified elsewhere: Secondary | ICD-10-CM | POA: Diagnosis present

## 2023-11-04 DIAGNOSIS — Z8249 Family history of ischemic heart disease and other diseases of the circulatory system: Secondary | ICD-10-CM

## 2023-11-04 DIAGNOSIS — E669 Obesity, unspecified: Secondary | ICD-10-CM | POA: Diagnosis present

## 2023-11-04 DIAGNOSIS — Z79899 Other long term (current) drug therapy: Secondary | ICD-10-CM

## 2023-11-04 DIAGNOSIS — Z79890 Hormone replacement therapy (postmenopausal): Secondary | ICD-10-CM

## 2023-11-04 DIAGNOSIS — Z5111 Encounter for antineoplastic chemotherapy: Secondary | ICD-10-CM | POA: Diagnosis not present

## 2023-11-04 DIAGNOSIS — E876 Hypokalemia: Secondary | ICD-10-CM | POA: Diagnosis present

## 2023-11-04 LAB — BASIC METABOLIC PANEL
Anion gap: 12 (ref 5–15)
BUN: 17 mg/dL (ref 6–20)
CO2: 25 mmol/L (ref 22–32)
Calcium: 8.3 mg/dL — ABNORMAL LOW (ref 8.9–10.3)
Chloride: 90 mmol/L — ABNORMAL LOW (ref 98–111)
Creatinine, Ser: 0.64 mg/dL (ref 0.44–1.00)
GFR, Estimated: >60 mL/min (ref >60)
Glucose, Bld: 155 mg/dL — ABNORMAL HIGH (ref 70–99)
Potassium: 3 mmol/L — ABNORMAL LOW (ref 3.5–5.1)
Sodium: 127 mmol/L — ABNORMAL LOW (ref 135–145)

## 2023-11-04 LAB — CBC WITH DIFFERENTIAL/PLATELET
Abs Immature Granulocytes: 0.01 10*3/uL (ref 0.00–0.07)
Basophils Absolute: 0 10*3/uL (ref 0.0–0.1)
Basophils Relative: 3 %
Eosinophils Absolute: 0 10*3/uL (ref 0.0–0.5)
Eosinophils Relative: 0 %
HCT: 28.3 % — ABNORMAL LOW (ref 36.0–46.0)
Hemoglobin: 9.8 g/dL — ABNORMAL LOW (ref 12.0–15.0)
Immature Granulocytes: 2 %
Lymphocytes Relative: 31 %
Lymphs Abs: 0.2 10*3/uL — ABNORMAL LOW (ref 0.7–4.0)
MCH: 31.1 pg (ref 26.0–34.0)
MCHC: 34.6 g/dL (ref 30.0–36.0)
MCV: 89.8 fL (ref 80.0–100.0)
Monocytes Absolute: 0.1 10*3/uL (ref 0.1–1.0)
Monocytes Relative: 14 %
Neutro Abs: 0.3 10*3/uL — CL (ref 1.7–7.7)
Neutrophils Relative %: 50 %
Platelets: 23 10*3/uL — CL (ref 150–400)
RBC: 3.15 MIL/uL — ABNORMAL LOW (ref 3.87–5.11)
RDW: 16.2 % — ABNORMAL HIGH (ref 11.5–15.5)
WBC: 0.6 10*3/uL — CL (ref 4.0–10.5)
nRBC: 0 % (ref 0.0–0.2)

## 2023-11-04 LAB — CBC WITH DIFFERENTIAL (CANCER CENTER ONLY)
Abs Immature Granulocytes: 0.01 10*3/uL (ref 0.00–0.07)
Basophils Absolute: 0 10*3/uL (ref 0.0–0.1)
Basophils Relative: 2 %
Eosinophils Absolute: 0 10*3/uL (ref 0.0–0.5)
Eosinophils Relative: 0 %
HCT: 19.4 % — ABNORMAL LOW (ref 36.0–46.0)
Hemoglobin: 6.6 g/dL — CL (ref 12.0–15.0)
Immature Granulocytes: 2 %
Lymphocytes Relative: 30 %
Lymphs Abs: 0.2 10*3/uL — ABNORMAL LOW (ref 0.7–4.0)
MCH: 30.7 pg (ref 26.0–34.0)
MCHC: 34 g/dL (ref 30.0–36.0)
MCV: 90.2 fL (ref 80.0–100.0)
Monocytes Absolute: 0.1 10*3/uL (ref 0.1–1.0)
Monocytes Relative: 22 %
Neutro Abs: 0.3 10*3/uL — CL (ref 1.7–7.7)
Neutrophils Relative %: 44 %
Platelet Count: 31 10*3/uL — ABNORMAL LOW (ref 150–400)
RBC: 2.15 MIL/uL — ABNORMAL LOW (ref 3.87–5.11)
RDW: 15.7 % — ABNORMAL HIGH (ref 11.5–15.5)
Smear Review: NORMAL
WBC Count: 0.6 10*3/uL — CL (ref 4.0–10.5)
nRBC: 0 % (ref 0.0–0.2)

## 2023-11-04 LAB — LACTIC ACID, PLASMA: Lactic Acid, Venous: 1.6 mmol/L (ref 0.5–1.9)

## 2023-11-04 LAB — GLUCOSE, CAPILLARY: Glucose-Capillary: 152 mg/dL — ABNORMAL HIGH (ref 70–99)

## 2023-11-04 LAB — SODIUM
Sodium: 129 mmol/L — ABNORMAL LOW (ref 135–145)
Sodium: 127 mmol/L — ABNORMAL LOW (ref 135–145)

## 2023-11-04 LAB — BASIC METABOLIC PANEL - CANCER CENTER ONLY
Anion gap: 14 (ref 5–15)
BUN: 17 mg/dL (ref 6–20)
CO2: 24 mmol/L (ref 22–32)
Calcium: 8.3 mg/dL — ABNORMAL LOW (ref 8.9–10.3)
Chloride: 88 mmol/L — ABNORMAL LOW (ref 98–111)
Creatinine: 0.73 mg/dL (ref 0.44–1.00)
GFR, Estimated: >60 mL/min (ref >60)
Glucose, Bld: 157 mg/dL — ABNORMAL HIGH (ref 70–99)
Potassium: 3.1 mmol/L — ABNORMAL LOW (ref 3.5–5.1)
Sodium: 126 mmol/L — ABNORMAL LOW (ref 135–145)

## 2023-11-04 LAB — ABO/RH: ABO/RH(D): A POS

## 2023-11-04 LAB — RESP PANEL BY RT-PCR (RSV, FLU A&B, COVID)  RVPGX2
Influenza A by PCR: NEGATIVE
Influenza B by PCR: NEGATIVE
Resp Syncytial Virus by PCR: NEGATIVE
SARS Coronavirus 2 by RT PCR: NEGATIVE

## 2023-11-04 LAB — HEMOGLOBIN AND HEMATOCRIT, BLOOD
HCT: 18.3 % — ABNORMAL LOW (ref 36.0–46.0)
Hemoglobin: 6.3 g/dL — ABNORMAL LOW (ref 12.0–15.0)

## 2023-11-04 LAB — TROPONIN I (HIGH SENSITIVITY): Troponin I (High Sensitivity): 8 ng/L (ref <18)

## 2023-11-04 LAB — PREPARE RBC (CROSSMATCH)

## 2023-11-04 LAB — OSMOLALITY: Osmolality: 275 mosm/kg (ref 275–295)

## 2023-11-04 MED ORDER — INSULIN ASPART 100 UNIT/ML IJ SOLN: 0.0000 [IU] | Freq: Three times a day (TID) | INTRAMUSCULAR | Status: DC

## 2023-11-04 MED ORDER — SODIUM CHLORIDE 0.9 % IV SOLN: 10.0000 mL/h | Freq: Once | INTRAVENOUS | Status: DC

## 2023-11-04 MED ORDER — VANCOMYCIN HCL IN DEXTROSE 1-5 GM/200ML-% IV SOLN
1000.0000 mg | Freq: Once | INTRAVENOUS | Status: AC
  Administered 2023-11-04: 1000 mg via INTRAVENOUS
  Filled 2023-11-04: qty 200

## 2023-11-04 MED ORDER — LACTATED RINGERS IV SOLN
150.0000 mL/h | INTRAVENOUS | Status: DC
  Administered 2023-11-04: 150 mL/h via INTRAVENOUS

## 2023-11-04 MED ORDER — ONDANSETRON HCL 4 MG/2ML IJ SOLN: 4.0000 mg | Freq: Four times a day (QID) | INTRAMUSCULAR | Status: DC | PRN

## 2023-11-04 MED ORDER — VANCOMYCIN HCL 1500 MG/300ML IV SOLN
1500.0000 mg | Freq: Once | INTRAVENOUS | Status: AC
  Administered 2023-11-04: 1500 mg via INTRAVENOUS
  Filled 2023-11-04: qty 300

## 2023-11-04 MED ORDER — SODIUM CHLORIDE 0.9 % IV SOLN
2.0000 g | Freq: Three times a day (TID) | INTRAVENOUS | Status: DC
  Administered 2023-11-04 – 2023-11-07 (×9): 2 g via INTRAVENOUS
  Filled 2023-11-04 (×11): qty 12.5

## 2023-11-04 MED ORDER — ONDANSETRON HCL 4 MG PO TABS: 4.0000 mg | ORAL_TABLET | Freq: Four times a day (QID) | ORAL | Status: DC | PRN

## 2023-11-04 MED ORDER — LACTATED RINGERS IV SOLN: INTRAVENOUS | Status: DC

## 2023-11-04 MED ORDER — ACETAMINOPHEN 325 MG PO TABS: 650.0000 mg | ORAL_TABLET | Freq: Four times a day (QID) | ORAL | Status: DC | PRN

## 2023-11-04 MED ORDER — ACETAMINOPHEN 500 MG PO TABS
1000.0000 mg | ORAL_TABLET | Freq: Once | ORAL | Status: AC
  Administered 2023-11-04: 1000 mg via ORAL
  Filled 2023-11-04: qty 2

## 2023-11-04 MED ORDER — SODIUM CHLORIDE 1 G PO TABS
1.0000 g | ORAL_TABLET | Freq: Two times a day (BID) | ORAL | Status: DC
Start: 2023-11-04 — End: 2023-11-07
  Administered 2023-11-04 – 2023-11-07 (×6): 1 g via ORAL
  Filled 2023-11-04 (×6): qty 1

## 2023-11-04 MED ORDER — VANCOMYCIN HCL 1750 MG/350ML IV SOLN
1750.0000 mg | INTRAVENOUS | Status: DC
  Administered 2023-11-05 – 2023-11-06 (×2): 1750 mg via INTRAVENOUS
  Filled 2023-11-04 (×3): qty 350

## 2023-11-04 MED ORDER — ENSURE MAX PROTEIN PO LIQD
11.0000 [oz_av] | Freq: Three times a day (TID) | ORAL | Status: DC | PRN
  Administered 2023-11-07: 11 [oz_av] via ORAL
  Filled 2023-11-04 (×4): qty 330

## 2023-11-04 NOTE — Progress Notes (Signed)
 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  This was followed by a CT abdomen pelvis with contrast which showed widespread metastatic disease to abdominopelvic and thoracic nodes, omentum and lungs.  Abnormal appearance of the uterus and lower uterine segment and cervix favoring endometrial or cervical primary.  Patient was seen by Dr. Johnnette Litter and underwent endometrial and cervical biopsy as well as biopsy of the retroperitoneal lymph node.  Cervical biopsy was consistent with squamous mucosa and abundant necrotic debris highly suspicious but not definitively diagnostic for malignancy.  FNA of the retroperitoneal lymph node showed poorly differentiated carcinoma with neuroendocrine features.   Comment: The following immunohistochemistry was performed after review of the clinical history and morphology to further characterize the pathologic process. The results are as follows:   A1-14  PanCK                      positive  A1-15  P16                           Positive, patchy A1-16  P53                           Normal/wild-type  A1-17  PAX8                        Focal blush positive  A1-18  ER Dx Only              negative  A1-19  Bio Neg  Ctrl              Appropriately reactive control slide  A1-22  CK7                          Positive, patchy A1-23  CK20                        negative  A1-24  GATA 3                    negative  A1-25  CDX-2                      Positive, patchy A1-26  TTF-1                        negative  A1-27  INSM-1                     Positive, patchy weak A1-28SynaptophysinPositive, patchy weak A1-31  BRG-1                      Favor retained (  weak) nuclear staining A1-32  BAF 47                     Retained nuclear staining A1-33  P63                           negative  A1-34  Claudin-4                  Positive, patchy    Smears and cell block show a poorly differentiated carcinoma. The immunophenotype is non-specific, but shows evidence of neuroendocrine differentiation.    Family history significant for stage IV colon cancer in her mother in her 62s.  Sister had breast cancer in her 60s.  Interval history- Patient is 58 year old female with above history of endometrial/cervical carcinoma with neuroendocrine differentiation, currently s/p cycle 2 of carbo-etoposide chemotherapy on 3/3-3/5 with GCSF administration on 10/28/23 who returns to clinic for follow up. She has felt poorly all week with little energy. Feels like symptoms are gradually improving compared to a few days ago. No fevers or chills. Feels generally cold. Mild shortness of breath. Denies nausea, vomiting. Denies black or bloody stools.   ECOG PS- 2 Pain scale- 0   Review of systems- Review of Systems  Constitutional:  Positive for malaise/fatigue. Negative for chills, fever and weight loss.  HENT:  Negative for congestion, ear discharge and nosebleeds.   Eyes:  Negative for blurred vision.  Respiratory:  Negative for cough, hemoptysis, sputum production, shortness of breath and wheezing.   Cardiovascular:  Negative for chest pain, palpitations, orthopnea and claudication.  Gastrointestinal:  Negative for abdominal pain, blood  in stool, constipation, diarrhea, heartburn, melena, nausea and vomiting.  Genitourinary:  Negative for dysuria, flank pain, frequency, hematuria and urgency.  Musculoskeletal:  Negative for back pain, joint pain and myalgias.  Skin:  Negative for rash.  Neurological:  Negative for dizziness, tingling, focal weakness, seizures, weakness and headaches.  Endo/Heme/Allergies:  Does not bruise/bleed easily.  Psychiatric/Behavioral:  Negative for depression and suicidal ideas. The patient does not have insomnia.     No Known Allergies  Past Medical History:  Diagnosis Date   Cancer (HCC)    Coronary artery disease    GERD (gastroesophageal reflux disease)    Hypertension    Hypothyroidism    Myocardial infarction (HCC)    Thyroid disease    Past Surgical History:  Procedure Laterality Date   CARDIAC CATHETERIZATION     CESAREAN SECTION     CORONARY STENT INTERVENTION N/A 07/24/2019   Procedure: CORONARY STENT INTERVENTION;  Surgeon: Yvonne Kendall, MD;  Location: [facility] INVASIVE CV LAB;  Service: Cardiovascular;  Laterality: N/A;  RCA & CFX   IR IMAGING GUIDED PORT INSERTION  09/23/2023   LEFT HEART CATH AND CORONARY ANGIOGRAPHY N/A 07/24/2019   Procedure: LEFT HEART CATH AND CORONARY ANGIOGRAPHY;  Surgeon: Lamar Blinks, MD;  Location: [facility] INVASIVE CV LAB;  Service: Cardiovascular;  Laterality: N/A;   TONSILLECTOMY     Social History   Socioeconomic History   Marital status: Married    Spouse name: Freeda Spivey   Number of children: 1   Years of education: Not on file   Highest education level: Not on file  Occupational History   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[employer])  Tobacco Use   Smoking status: Never   Smokeless tobacco: Never  Vaping Use   Vaping status: Never Used  Substance and Sexual Activity  Alcohol use: Not Currently    Comment: occ   Drug use: Never   Sexual activity: Not Currently  Other Topics Concern   Not on file  Social History Narrative   Not on  file   Social Drivers of Health   Financial Resource Strain: Low Risk  (07/23/2019)   Overall Financial Resource Strain (CARDIA)    Difficulty of Paying Living Expenses: Not hard at all  Food Insecurity: No Food Insecurity (10/11/2023)   Hunger Vital Sign    Worried About Running Out of Food in the Last Year: Never true    Ran Out of Food in the Last Year: Never true  Transportation Needs: No Transportation Needs (10/11/2023)   PRAPARE - Administrator, Civil Service (Medical): No    Lack of Transportation (Non-Medical): No  Physical Activity: Unknown (07/23/2019)   Exercise Vital Sign    Days of Exercise per Week: 2 days    Minutes of Exercise per Session: Not on file  Stress: No Stress Concern Present (07/23/2019)   [employer] of Occupational Health - Occupational Stress Questionnaire    Feeling of Stress : Not at all  Social Connections: Moderately Integrated (10/11/2023)   Social Connection and Isolation Panel [NHANES]    Frequency of Communication with Friends and Family: More than three times a week    Frequency of Social Gatherings with Friends and Family: Twice a week    Attends Religious Services: 1 to 4 times per year    Active Member of [employer] or Organizations: No    Attends Banker Meetings: Never    Marital Status: Married  Catering manager Violence: Not At Risk (10/11/2023)   Humiliation, Afraid, Rape, and Kick questionnaire    Fear of Current or Ex-Partner: No    Emotionally Abused: No    Physically Abused: No    Sexually Abused: No   Family History  Problem Relation Age of Onset   Atrial fibrillation Mother        Passed January 2024.   Colon cancer Mother        Diagnosed 2007   Heart Problems Father    Heart attack Father    Heart disease Father    Current Outpatient Medications:    Cholecalciferol (VITAMIN D-1000 MAX ST) 25 MCG (1000 UT) tablet, Take 1,000 Units by mouth daily., Disp: , Rfl:    ERGOCALCIFEROL PO, Take 1  capsule by mouth every 7 (seven) days., Disp: , Rfl:    lidocaine-prilocaine (EMLA) cream, Apply to affected area once, Disp: 30 g, Rfl: 3   losartan (COZAAR) 25 MG tablet, Take 50 mg by mouth daily., Disp: , Rfl:    metoprolol tartrate (LOPRESSOR) 25 MG tablet, Take 0.5 tablets (12.5 mg total) by mouth 2 (two) times daily., Disp: 15 tablet, Rfl: 0   Multiple Vitamin (MULTI-VITAMIN) tablet, Take 1 tablet by mouth daily., Disp: , Rfl:    ondansetron (ZOFRAN) 8 MG tablet, Take 1 tablet (8 mg total) by mouth every 8 (eight) hours as needed for nausea, vomiting or refractory nausea / vomiting. Start on the third day after carboplatin., Disp: 30 tablet, Rfl: 1   prochlorperazine (COMPAZINE) 10 MG tablet, Take 1 tablet (10 mg total) by mouth every 6 (six) hours as needed for nausea or vomiting., Disp: 30 tablet, Rfl: 1   sodium chloride 1 g tablet, Take 1 tablet (1 g total) by mouth 2 (two) times daily with a meal., Disp: 60 tablet, Rfl: 1  thyroid (ARMOUR) 120 MG tablet, Take 60 mg by mouth daily before breakfast., Disp: , Rfl:    vancomycin (VANCOCIN) 125 MG capsule, Take 1 capsule (125 mg total) by mouth 4 (four) times daily. Take one capsule by mouth fours times a day until 10/16/23 then starting 2/24 take one capsule once a day until complete amoxicillin therapy (on 10/29/2023), Disp: 20 capsule, Rfl: 0   Vitamin D, Ergocalciferol, (DRISDOL) 1.25 MG (50000 UNIT) CAPS capsule, Take 50,000 Units by mouth once a week., Disp: , Rfl:   Physical exam:  Vitals:   11/04/23 1345  BP: 130/72  Pulse: (!) 125  Resp: 18  Temp: 98.4 F (36.9 C)  TempSrc: Oral  SpO2: 96%  Weight: 250 lb (113.4 kg)   Physical Exam Constitutional:      Comments: Sitting in a wheelchair.  Appears in no acute distress  Eyes:     General: No scleral icterus. Cardiovascular:     Rate and Rhythm: Normal rate and regular rhythm.  Pulmonary:     Effort: Pulmonary effort is normal.  Musculoskeletal:     Right lower leg: Edema  present.     Left lower leg: Edema present.  Skin:    General: Skin is dry.     Coloration: Skin is pale.  Neurological:     Mental Status: She is alert and oriented to person, place, and time.  Psychiatric:        Mood and Affect: Mood normal.        Behavior: Behavior normal.        Latest Ref Rng & Units 11/04/2023    1:33 PM  CMP  Glucose 70 - 99 mg/dL 956   BUN 6 - 20 mg/dL 17   Creatinine 2.13 - 1.00 mg/dL 0.86   Sodium 578 - 469 mmol/L 126   Potassium 3.5 - 5.1 mmol/L 3.1   Chloride 98 - 111 mmol/L 88   CO2 22 - 32 mmol/L 24   Calcium 8.9 - 10.3 mg/dL 8.3       Latest Ref Rng & Units 11/04/2023    1:33 PM  CBC  WBC 4.0 - 10.5 K/uL 0.6   Hemoglobin 12.0 - 15.0 g/dL 6.6   Hematocrit 62.9 - 46.0 % 19.4   Platelets 150 - 400 K/uL 31     Assessment and plan- Patient is a 58 y.o. female with   Chemotherapy induced pancytopenia- she received carbo C2D1 on 3/3 and etoposide C2D1-3. She is now more than 10 days from treatment and I would have expected that her counts would have reached nadir. Persistently decreased however though unclear if continuing to drop or improving.  Anemia- hmg 6.6. Symptomatic. Recommend transfusion of irradiated blood 1-2 units.  Thrombocytopenia- plt 31. Not on anticoagulants. Denies bleeding. Monitor.  Neutropenia- s/p GCSF on 10/28/23. ANC 0.3. Afebrile. Monitor. Recommend antibiotics if febrile.  Stage IV Poorly differentiated carcinoma of the endometrium/cervix with neuroendocrine features- s/p cycle 2 of carbo-etoposide chemotherapy. Cycle 1 complicated by c diff infection. Tecentriq held d/t insurance denial.  AKI- improved.  C diff- s/p hospitalization. No completed oral vancomycin. Would not recommend checking stool again unless significantly worsening symptoms d/t false positive.  Actinomyces- s/p ID consult. She has completed amoxicillin.  Hypokalemia- K 3.1. Not on oral supplementation. Consider starting.  Hyponatremia- Na 126. Worse.  Continue sodium chloride tablets twice a day. May need to increase to TID.   Disposition:  Transfer to ER for possible transfusion   Visit Diagnosis 1.  Antineoplastic chemotherapy induced pancytopenia (HCC)   2. Symptomatic anemia   3. Thrombocytopenia (HCC)   4. Chemotherapy-induced neutropenia (HCC)    Consuello Masse, DNP, AGNP-C, [facility] [phone] (clinic) 11/04/2023

## 2023-11-04 NOTE — Assessment & Plan Note (Signed)
 White count 0.6, hemoglobin 6.6, Plt 31 in setting of chemotherapy  Pending pRBC transfusion  Concurrent febrile neutropenia  Trend cell lines  Consult hem-onc  Monitor

## 2023-11-04 NOTE — Assessment & Plan Note (Signed)
 Baseline stage IV endometrial carcinoma  Followed by Dr. Smith Robert  On Chemotherapy

## 2023-11-04 NOTE — Assessment & Plan Note (Signed)
 2D ECHO 07/2019 w/ EF 45%  Appears euvolemic to mildly dry with concurrent sepsis/febrile neutropenia  Monitor volume status w/ treatment

## 2023-11-04 NOTE — Assessment & Plan Note (Addendum)
 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

## 2023-11-04 NOTE — Assessment & Plan Note (Signed)
 Blood sugar 150s  SSI  Monitor

## 2023-11-04 NOTE — Assessment & Plan Note (Signed)
 No active CP  EKG sinus tach  Cont home regimen

## 2023-11-04 NOTE — H&P (Addendum)
 History and Physical    Patient: Tonya [name] HYQ:[id number] DOB: 03/26/1966 DOA: 11/04/2023 DOS: the patient was seen and examined on 11/04/2023 PCP: Danella Penton, MD  Patient coming from: Home  Chief Complaint:  Chief Complaint  Patient presents with   Abnormal Lab   HPI: Tonya [name] is a 58 y.o. female with medical history significant of obesity, endometrial cancer, hypertension, SIADH, hypothyroidism, chronic HFpEF, recent diagnosis of C. difficile as well as strep bacteremia presenting with sepsis, pneumonia presenting with sepsis, febrile neutropenia, pancytopenia.  Patient reports generalized malaise over the past 1 to 2 days.  Noted baseline endometrial carcinoma followed by Dr. Smith Robert on IV chemotherapy.  Was seen at oncology clinic earlier today in the setting of pancytopenia.  Redirected to the ER because of symptomatic anemia.  Patient denies any significant symptomatic symptoms apart from cough.  Mild abdominal pain.  No diarrhea, no dysuria.  No focal hemiparesis or confusion.  No headache.  Positive decreased appetite.  Has had little p.o./fluid intake.  Noted admission February 2025 for issues including severe sepsis with C. difficile colitis, streptococcal bacteremia.  Patient has completed courses of oral vancomycin as well as amoxicillin with no other symptoms at present. Presented to the ER Tmax 101.7, heart rate 100s, BP stable.  Satting well on room air.  White count 0.6, hemoglobin 6.6, platelets 31, creatinine 0.73, glucose 157, sodium 126.  Pending PRBC transfusion in the ER.  Lactate 1.6.  Chest x-ray within normal limits. Review of Systems: As mentioned in the history of present illness. All other systems reviewed and are negative. Past Medical History:  Diagnosis Date   Cancer (HCC)    Coronary artery disease    GERD (gastroesophageal reflux disease)    Hypertension    Hypothyroidism    Myocardial infarction (HCC)    Thyroid disease    Past Surgical History:   Procedure Laterality Date   CARDIAC CATHETERIZATION     CESAREAN SECTION     CORONARY STENT INTERVENTION N/A 07/24/2019   Procedure: CORONARY STENT INTERVENTION;  Surgeon: Yvonne Kendall, MD;  Location: [facility] INVASIVE [facility];  Service: Cardiovascular;  Laterality: N/A;  RCA & CFX   IR IMAGING GUIDED PORT INSERTION  09/23/2023   LEFT HEART CATH AND CORONARY ANGIOGRAPHY N/A 07/24/2019   Procedure: LEFT HEART CATH AND CORONARY ANGIOGRAPHY;  Surgeon: Lamar Blinks, MD;  Location: [facility] INV[facility] CV [facility];  Service: Cardiovascular;  Laterality: N/A;   TONSILLECTOMY     Social History:  reports that she has never smoked. She has never used smokeless tobacco. She reports that she does not currently use alcohol. She reports that she does not use drugs.  No Known Allergies  Family History  Problem Relation Age of Onset   Atrial fibrillation Mother        Passed January 2024.   Colon cancer Mother        Diagnosed 2007   Heart Problems Father    Heart attack Father    Heart disease Father     Prior to Admission medications   Medication Sig Start Date End Date Taking? Authorizing Provider  Cholecalciferol (VITAMIN D-1000 MAX ST) 25 MCG (1000 UT) tablet Take 1,000 Units by mouth daily. 05/25/23   [provider]  ERGOCALCIFEROL PO Take 1 capsule by mouth every 7 (seven) days. 08/03/23   [provider]  lidocaine-prilocaine (EMLA) cream Apply to affected area once 09/21/23   Creig Hines, MD  losartan (COZAAR) 25 MG tablet Take 50  mg by mouth daily. 09/19/23   [provider]  metoprolol tartrate (LOPRESSOR) 25 MG tablet Take 0.5 tablets (12.5 mg total) by mouth 2 (two) times daily. 10/14/23 11/13/23  Charise Killian, MD  Multiple Vitamin (MULTI-VITAMIN) tablet Take 1 tablet by mouth daily.    [provider]  ondansetron (ZOFRAN) 8 MG tablet Take 1 tablet (8 mg total) by mouth every 8 (eight) hours as needed for nausea, vomiting or refractory nausea /  vomiting. Start on the third day after carboplatin. 09/21/23   Creig Hines, MD  prochlorperazine (COMPAZINE) 10 MG tablet Take 1 tablet (10 mg total) by mouth every 6 (six) hours as needed for nausea or vomiting. 09/21/23   Creig Hines, MD  sodium chloride 1 g tablet Take 1 tablet (1 g total) by mouth 2 (two) times daily with a meal. 10/06/23   Wouk, Wilfred Curtis, MD  thyroid (ARMOUR) 120 MG tablet Take 60 mg by mouth daily before breakfast.    [provider]  Vitamin D, Ergocalciferol, (DRISDOL) 1.25 MG (50000 UNIT) CAPS capsule Take 50,000 Units by mouth once a week. 08/03/23   [provider]    Physical Exam: Vitals:   11/04/23 1458 11/04/23 1610  BP: 130/68   Pulse: (!) 123   Resp: 20   Temp: 99.8 F (37.7 C) (!) 101.7 F (38.7 C)  TempSrc: Oral Rectal  SpO2: 96%    Physical Exam Constitutional:      Appearance: She is obese.  HENT:     Head: Normocephalic and atraumatic.     Nose: Nose normal.  Eyes:     Pupils: Pupils are equal, round, and reactive to light.  Cardiovascular:     Rate and Rhythm: Normal rate and regular rhythm.  Pulmonary:     Effort: Pulmonary effort is normal.  Abdominal:     General: Bowel sounds are normal.  Musculoskeletal:        General: Normal range of motion.  Skin:    Coloration: Skin is pale.  Neurological:     General: No focal deficit present.  Psychiatric:        Mood and Affect: Mood normal.     Data Reviewed:  There are no new results to review at this time.  DG Chest Port 1 View CLINICAL DATA:  Questionable sepsis - evaluate for abnormality. Tachycardia. Shortness of breath.  EXAM: PORTABLE CHEST 1 VIEW  COMPARISON:  10/10/2023.  FINDINGS: Linear area of scarring/atelectasis noted overlying the left lower lung zone, laterally. There also probable left retrocardiac atelectatic changes. Bilateral lung fields are otherwise clear. No dense consolidation or lung collapse. Bilateral costophrenic  angles are clear.  Stable cardio-mediastinal silhouette.  No acute osseous abnormalities.  The soft tissues are within normal limits.  Right-sided CT Port-A-Cath is seen with its tip overlying the cavoatrial junction region.  IMPRESSION: No active disease.  Electronically Signed   By: Jules Schick M.D.   On: 11/04/2023 17:33  Lab Results  Component Value Date   WBC 0.6 (LL) 11/04/2023   HGB 9.8 (L) 11/04/2023   HCT 28.3 (L) 11/04/2023   MCV 89.8 11/04/2023   PLT 23 (LL) 11/04/2023   Last metabolic panel Lab Results  Component Value Date   GLUCOSE 155 (H) 11/04/2023   NA 127 (L) 11/04/2023   K 3.0 (L) 11/04/2023   CL 90 (L) 11/04/2023   CO2 25 11/04/2023   BUN 17 11/04/2023   CREATININE 0.64 11/04/2023   GFRNONAA >60  11/04/2023   CALCIUM 8.3 (L) 11/04/2023   PHOS 1.8 (L) 10/14/2023   PROT 6.5 10/24/2023   ALBUMIN 2.9 (L) 10/24/2023   BILITOT 1.2 10/24/2023   ALKPHOS 134 (H) 10/24/2023   AST 24 10/24/2023   ALT 16 10/24/2023   ANIONGAP 12 11/04/2023    Assessment and Plan: Sepsis (HCC) Febrile neutropenia Meeting sepsis criteria with temperature one 1.7, heart rate 100s Noted baseline pancytopenia in the setting of stage IV endometrial carcinoma on chemotherapy Panculture Febrile neutropenia coverage with cefepime and vancomycin LR maintenance IV fluids Monitor   Pancytopenia (HCC) White count 0.6, hemoglobin 6.6, Plt 31 in setting of chemotherapy  Pending pRBC transfusion  Concurrent febrile neutropenia  Trend cell lines  Consult hem-onc  Monitor    Endometrial carcinoma (HCC) Baseline stage IV endometrial carcinoma  Followed by Dr. Smith Robert  On Chemotherapy    Type 2 diabetes mellitus with peripheral angiopathy (HCC) Blood sugar 150s SSI  Monitor  Hyponatremia SIADH Na 126 on presentation  Euvolemic to mildly dry-body habitus confounding issue  Noted baseline SIADH  IVF in setting of sepsis treatment  On twice daily salt tabs  chronically Serial Na Check urine and serum studies  Goal 6-75mEq change over next 12-24 hours  Continue home salt tablets Consider uptitrating salt tabs to 3 times daily dosing as appropriate Monitor    Coronary artery disease involving native coronary artery of native heart with angina pectoris (HCC) No active CP  EKG sinus tach  Cont home regimen    Ischemic cardiomyopathy 2D ECHO 07/2019 w/ EF 45%  Appears euvolemic to mildly dry with concurrent sepsis/febrile neutropenia  Monitor volume status w/ treatment    Hypothyroidism (acquired) Cont armour thyroid      Greater than 50% was spent in counseling and coordination of care with patient Critical care time: 60+ minutes    Advance Care Planning:   Code Status: Full Code   Consults: hem-onc   Family Communication: No family at the bedside   Severity of Illness: The appropriate patient status for this patient is INPATIENT. Inpatient status is judged to be reasonable and necessary in order to provide the required intensity of service to ensure the patient's safety. The patient's presenting symptoms, physical exam findings, and initial radiographic and laboratory data in the context of their chronic comorbidities is felt to place them at high risk for further clinical deterioration. Furthermore, it is not anticipated that the patient will be medically stable for discharge from the hospital within 2 midnights of admission.   * I certify that at the point of admission it is my clinical judgment that the patient will require inpatient hospital care spanning beyond 2 midnights from the point of admission due to high intensity of service, high risk for further deterioration and high frequency of surveillance required.*  Author: Floydene Flock, MD 11/04/2023 5:47 PM  For on call review [email or web address].

## 2023-11-04 NOTE — Progress Notes (Addendum)
 Pharmacy Antibiotic Note  Tonya [name] is a 58 y.o. female admitted on 11/04/2023 with  febrile neutropenia .  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 Pharmacy has been consulted for vancomycin dosing.  Today, 11/04/23: WBC 0.6 ANC 0.3 Scr 0.73 (baseline) Tmax 99.8  Plan: Give vancomycin 2500 mg IV x1 followed by 1750 mg IV Q24H. Goal AUC 400-550. Expected AUC: 526.1 Expected Css min: 11.2 SCr used: 0.8  Weight used: IBW, Vd used: 0.5 (BMI 42.91) Patient is also on cefepime 2 g IV Q8H Continue to monitor renal function and follow culture results     Temp (24hrs), Avg:99.1 F (37.3 C), Min:98.4 F (36.9 C), Max:99.8 F (37.7 C)  Recent Labs  Lab 11/04/23 1333  WBC 0.6*  CREATININE 0.73    Estimated Creatinine Clearance: 93.5 mL/min (by C-G formula based on SCr of 0.73 mg/dL).    No Known Allergies  Antimicrobials this admission: 3/14 Vanc >>  3/14 Cefepime >>   Microbiology results: None  Thank you for allowing pharmacy to be a part of this patient's care.  Merryl Hacker, PharmD Clinical Pharmacist  11/04/2023 4:41 PM

## 2023-11-04 NOTE — ED Provider Notes (Signed)
 [facility] Provider Note    Event Date/Time   First MD Initiated Contact with Patient 11/04/23 1601     (approximate)   History   Abnormal Lab   HPI  Tonya [name] is a 58 y.o. 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  Patient also neutropenic.  States that she has been feeling weak.  Was sent over to the ER initially just for blood transfusion but noted to feel warm on exam and found to be febrile total 101.  She denies any signs or symptoms of active bleeding.     Physical Exam   Triage Vital Signs: ED Triage Vitals  Encounter Vitals Group     BP 11/04/23 1458 130/68     Systolic BP Percentile --      Diastolic BP Percentile --      Pulse Rate 11/04/23 1458 (!) 123     Resp 11/04/23 1458 20     Temp 11/04/23 1458 99.8 F (37.7 C)     Temp Source 11/04/23 1458 Oral     SpO2 11/04/23 1458 96 %     Weight --      Height --      Head Circumference --      Peak Flow --      Pain Score 11/04/23 1459 0     Pain Loc --      Pain Education --      Exclude from Growth Chart --     Most recent vital signs: Vitals:   11/04/23 1458  BP: 130/68  Pulse: (!) 123  Resp: 20  Temp: 99.8 F (37.7 C)  SpO2: 96%     Constitutional: Alert  Eyes: Conjunctivae are normal.  Head: Atraumatic. Nose: No congestion/rhinnorhea. Mouth/Throat: Mucous membranes are moist.   Neck: Painless ROM.  Cardiovascular:   Good peripheral circulation. Respiratory: Normal respiratory effort.  No retractions.  Gastrointestinal: Soft and nontender.  Musculoskeletal:  no deformity Neurologic:  MAE spontaneously. No gross focal neurologic deficits are appreciated.  Skin:  Skin is warm, dry and intact. No rash noted. Psychiatric: Mood and affect are normal. Speech and behavior are normal.    ED Results /  Procedures / Treatments   Labs (all labs ordered are listed, but only abnormal results are displayed) Labs Reviewed  RESP PANEL BY RT-PCR (RSV, FLU A&B, COVID)  RVPGX2  CULTURE, BLOOD (ROUTINE X 2)  CULTURE, BLOOD (ROUTINE X 2)  CBC WITH DIFFERENTIAL/PLATELET  BASIC METABOLIC PANEL  LACTIC ACID, PLASMA  LACTIC ACID, PLASMA  URINALYSIS, W/ REFLEX TO CULTURE (INFECTION SUSPECTED)  TYPE AND SCREEN  PREPARE RBC (CROSSMATCH)     EKG     RADIOLOGY Please see ED Course for my review and interpretation.  I personally reviewed all radiographic images ordered to evaluate for the above acute complaints and reviewed radiology reports and findings.  These findings were personally discussed with the patient.  Please see medical record for radiology report.    PROCEDURES:  Critical Care performed: Yes, see critical care procedure note(s)  .Critical Care  Performed by: Willy Eddy, MD Authorized by: Willy Eddy, MD   Critical care provider statement:    Critical care time (minutes):  35   Critical care was necessary to treat or prevent imminent or life-threatening deterioration of the following conditions:  Sepsis   Critical care was time  spent personally by me on the following activities:  Ordering and performing treatments and interventions, ordering and review of laboratory studies, ordering and review of radiographic studies, pulse oximetry, re-evaluation of patient's condition, review of old charts, obtaining history from patient or surrogate, examination of patient, evaluation of patient's response to treatment, discussions with primary provider, discussions with consultants and development of treatment plan with patient or surrogate    MEDICATIONS ORDERED IN ED: Medications  ceFEPIme (MAXIPIME) 2 g in sodium chloride 0.9 % 100 mL IVPB (has no administration in time range)  0.9 %  sodium chloride infusion (has no administration in time range)  acetaminophen  (TYLENOL) tablet 1,000 mg (1,000 mg Oral Given 11/04/23 1637)     IMPRESSION / MDM / ASSESSMENT AND PLAN / ED COURSE  I reviewed the triage vital signs and the nursing notes.                              Differential diagnosis includes, but is not limited to, neutropenic fever, pancytopenia, symptomatic anemia  Patient presenting to the ER for evaluation of symptoms as described above.  Based on symptoms, risk factors and considered above differential, this presenting complaint could reflect a potentially life-threatening illness therefore the patient will be placed on continuous pulse oximetry and telemetry for monitoring.  Laboratory evaluation will be sent to evaluate for the above complaints.  Patient found to be febrile to 101.  Consistent with neutropenic fever   Clinical Course as of 11/04/23 1644  Fri Nov 04, 2023  1644 Case was discussed in consultation with hospitalist who agrees for admission for further workup and management of neutropenic fever and symptomatic anemia. [PR]    Clinical Course User Index [PR] Willy Eddy, MD     FINAL CLINICAL IMPRESSION(S) / ED DIAGNOSES   Final diagnoses:  Neutropenic fever (HCC)  Symptomatic anemia     Rx / DC Orders   ED Discharge Orders     None        Note:  This document was prepared using Dragon voice recognition software and may include unintentional dictation errors.    Willy Eddy, MD 11/04/23 [phone]

## 2023-11-04 NOTE — Progress Notes (Signed)
Patient transported to ED

## 2023-11-04 NOTE — Assessment & Plan Note (Signed)
 Febrile neutropenia Meeting sepsis criteria with temperature one 1.7, heart rate 100s Noted baseline pancytopenia in the setting of stage IV endometrial carcinoma on chemotherapy Panculture Febrile neutropenia coverage with cefepime and vancomycin LR maintenance IV fluids Monitor

## 2023-11-04 NOTE — ED Triage Notes (Signed)
 Pt to ED via cancer center. Pt was having lab work done and staff sent pt to ER for low HGB of 6.6. Pt dx with cervical cancer and is on chemo. Pt accessed by cancer center. Pt tachy HR 120s and reports SOB.

## 2023-11-04 NOTE — ED Notes (Signed)
 Blood held previously d/t fever, last oral temp 98.8. Lab called for verification of hgb levels. Pt to floor in NAD.

## 2023-11-04 NOTE — Assessment & Plan Note (Signed)
 Cont armour thyroid

## 2023-11-05 DIAGNOSIS — R5081 Fever presenting with conditions classified elsewhere: Secondary | ICD-10-CM | POA: Diagnosis not present

## 2023-11-05 DIAGNOSIS — D709 Neutropenia, unspecified: Secondary | ICD-10-CM | POA: Diagnosis not present

## 2023-11-05 LAB — CBC WITH DIFFERENTIAL/PLATELET
Abs Immature Granulocytes: 0.21 10*3/uL — ABNORMAL HIGH (ref 0.00–0.07)
Basophils Absolute: 0 10*3/uL (ref 0.0–0.1)
Basophils Relative: 2 %
Eosinophils Absolute: 0 10*3/uL (ref 0.0–0.5)
Eosinophils Relative: 1 %
HCT: 21.1 % — ABNORMAL LOW (ref 36.0–46.0)
Hemoglobin: 7.5 g/dL — ABNORMAL LOW (ref 12.0–15.0)
Immature Granulocytes: 9 %
Lymphocytes Relative: 17 %
Lymphs Abs: 0.4 10*3/uL — ABNORMAL LOW (ref 0.7–4.0)
MCH: 30.4 pg (ref 26.0–34.0)
MCHC: 35.5 g/dL (ref 30.0–36.0)
MCV: 85.4 fL (ref 80.0–100.0)
Monocytes Absolute: 0.3 10*3/uL (ref 0.1–1.0)
Monocytes Relative: 11 %
Neutro Abs: 1.4 10*3/uL — ABNORMAL LOW (ref 1.7–7.7)
Neutrophils Relative %: 60 %
Platelets: 35 10*3/uL — ABNORMAL LOW (ref 150–400)
RBC: 2.47 MIL/uL — ABNORMAL LOW (ref 3.87–5.11)
RDW: 15.4 % (ref 11.5–15.5)
Smear Review: DECREASED
WBC: 2.4 10*3/uL — ABNORMAL LOW (ref 4.0–10.5)
nRBC: 0.8 % — ABNORMAL HIGH (ref 0.0–0.2)

## 2023-11-05 LAB — BLOOD CULTURE ID PANEL (REFLEXED) - BCID2

## 2023-11-05 LAB — COMPREHENSIVE METABOLIC PANEL
ALT: 14 U/L (ref 0–44)
AST: 13 U/L — ABNORMAL LOW (ref 15–41)
Albumin: 2.2 g/dL — ABNORMAL LOW (ref 3.5–5.0)
Alkaline Phosphatase: 69 U/L (ref 38–126)
Anion gap: 10 (ref 5–15)
BUN: 18 mg/dL (ref 6–20)
CO2: 26 mmol/L (ref 22–32)
Calcium: 8 mg/dL — ABNORMAL LOW (ref 8.9–10.3)
Chloride: 92 mmol/L — ABNORMAL LOW (ref 98–111)
Creatinine, Ser: 0.52 mg/dL (ref 0.44–1.00)
GFR, Estimated: >60 mL/min (ref >60)
Glucose, Bld: 122 mg/dL — ABNORMAL HIGH (ref 70–99)
Potassium: 2.7 mmol/L — CL (ref 3.5–5.1)
Sodium: 128 mmol/L — ABNORMAL LOW (ref 135–145)
Total Bilirubin: 1 mg/dL (ref 0.0–1.2)
Total Protein: 5.7 g/dL — ABNORMAL LOW (ref 6.5–8.1)

## 2023-11-05 LAB — BPAM RBC
Blood Product Expiration Date: 202504132359
ISSUE DATE / TIME: 202503142108
Unit Type and Rh: 6200

## 2023-11-05 LAB — BASIC METABOLIC PANEL
Anion gap: 8 (ref 5–15)
BUN: 17 mg/dL (ref 6–20)
CO2: 27 mmol/L (ref 22–32)
Calcium: 8.1 mg/dL — ABNORMAL LOW (ref 8.9–10.3)
Chloride: 94 mmol/L — ABNORMAL LOW (ref 98–111)
Creatinine, Ser: 0.69 mg/dL (ref 0.44–1.00)
GFR, Estimated: >60 mL/min (ref >60)
Glucose, Bld: 107 mg/dL — ABNORMAL HIGH (ref 70–99)
Potassium: 3.3 mmol/L — ABNORMAL LOW (ref 3.5–5.1)
Sodium: 129 mmol/L — ABNORMAL LOW (ref 135–145)

## 2023-11-05 LAB — SODIUM: Sodium: 128 mmol/L — ABNORMAL LOW (ref 135–145)

## 2023-11-05 LAB — TYPE AND SCREEN
ABO/RH(D): A POS
Antibody Screen: NEGATIVE
Unit division: 0

## 2023-11-05 LAB — CBC
HCT: 20.5 % — ABNORMAL LOW (ref 36.0–46.0)
Hemoglobin: 7.4 g/dL — ABNORMAL LOW (ref 12.0–15.0)
MCH: 31.5 pg (ref 26.0–34.0)
MCHC: 36.1 g/dL — ABNORMAL HIGH (ref 30.0–36.0)
MCV: 87.2 fL (ref 80.0–100.0)
Platelets: 31 10*3/uL — ABNORMAL LOW (ref 150–400)
RBC: 2.35 MIL/uL — ABNORMAL LOW (ref 3.87–5.11)
RDW: 15.2 % (ref 11.5–15.5)
WBC: 1.4 10*3/uL — CL (ref 4.0–10.5)
nRBC: 0 % (ref 0.0–0.2)

## 2023-11-05 LAB — GLUCOSE, CAPILLARY
Glucose-Capillary: 95 mg/dL (ref 70–99)
Glucose-Capillary: 107 mg/dL — ABNORMAL HIGH (ref 70–99)
Glucose-Capillary: 87 mg/dL (ref 70–99)
Glucose-Capillary: 75 mg/dL (ref 70–99)

## 2023-11-05 LAB — MAGNESIUM: Magnesium: 1.6 mg/dL — ABNORMAL LOW (ref 1.7–2.4)

## 2023-11-05 MED ORDER — SODIUM CHLORIDE 0.9% FLUSH: 10.0000 mL | INTRAVENOUS | Status: DC | PRN

## 2023-11-05 MED ORDER — SODIUM CHLORIDE 0.9% FLUSH
10.0000 mL | Freq: Two times a day (BID) | INTRAVENOUS | Status: DC
  Administered 2023-11-05 (×2): 10 mL
  Administered 2023-11-05: 20 mL
  Administered 2023-11-06 (×2): 10 mL

## 2023-11-05 MED ORDER — NYSTATIN 100000 UNIT/GM EX POWD
Freq: Three times a day (TID) | CUTANEOUS | Status: DC
  Filled 2023-11-05 (×3): qty 15

## 2023-11-05 MED ORDER — THYROID 60 MG PO TABS
120.0000 mg | ORAL_TABLET | Freq: Every day | ORAL | Status: DC
  Administered 2023-11-06 – 2023-11-07 (×2): 120 mg via ORAL
  Filled 2023-11-05 (×3): qty 2

## 2023-11-05 MED ORDER — MAGNESIUM SULFATE 2 GM/50ML IV SOLN
2.0000 g | Freq: Once | INTRAVENOUS | Status: AC
  Administered 2023-11-05: 2 g via INTRAVENOUS
  Filled 2023-11-05: qty 50

## 2023-11-05 MED ORDER — POTASSIUM CHLORIDE 10 MEQ/100ML IV SOLN
10.0000 meq | INTRAVENOUS | Status: AC
  Administered 2023-11-05 (×4): 10 meq via INTRAVENOUS
  Filled 2023-11-05 (×4): qty 100

## 2023-11-05 MED ORDER — CHLORHEXIDINE GLUCONATE CLOTH 2 % EX PADS
6.0000 | MEDICATED_PAD | Freq: Every day | CUTANEOUS | Status: DC
  Administered 2023-11-05 – 2023-11-07 (×3): 6 via TOPICAL

## 2023-11-05 MED ORDER — POTASSIUM CHLORIDE CRYS ER 20 MEQ PO TBCR
40.0000 meq | EXTENDED_RELEASE_TABLET | Freq: Once | ORAL | Status: AC
  Administered 2023-11-05: 40 meq via ORAL
  Filled 2023-11-05: qty 2

## 2023-11-05 MED ORDER — ORAL CARE MOUTH RINSE: 15.0000 mL | OROMUCOSAL | Status: DC | PRN

## 2023-11-05 NOTE — Plan of Care (Signed)

## 2023-11-05 NOTE — Progress Notes (Signed)
 Progress Note   Patient: Tonya [name] ZOX:[id number] DOB: 01-22-1966 DOA: 11/04/2023     1 DOS: the patient was seen and examined on 11/05/2023   Brief hospital course: From H&P "Dulcinea Kinser is a 58 y.o. female with medical history significant of obesity, endometrial cancer, hypertension, SIADH, hypothyroidism, chronic HFpEF, recent diagnosis of C. difficile as well as strep bacteremia presenting with sepsis, pneumonia presenting with sepsis, febrile neutropenia, pancytopenia.  Patient reports generalized malaise over the past 1 to 2 days.  Noted baseline endometrial carcinoma followed by Dr. Smith Robert on IV chemotherapy.  Was seen at oncology clinic earlier today in the setting of pancytopenia.  Redirected to the ER because of symptomatic anemia.  Patient denies any significant symptomatic symptoms apart from cough.  Mild abdominal pain.  No diarrhea, no dysuria.  No focal hemiparesis or confusion.  No headache.  Positive decreased appetite.  Has had little p.o./fluid intake.  Noted admission February 2025 for issues including severe sepsis with C. difficile colitis, streptococcal bacteremia.  Patient has completed courses of oral vancomycin as well as amoxicillin with no other symptoms at present. Presented to the ER Tmax 101.7, heart rate 100s, BP stable.  Satting well on room air.  White count 0.6, hemoglobin 6.6, platelets 31, creatinine 0.73, glucose 157, sodium 126.  Pending PRBC transfusion in the ER.  Lactate 1.6.  Chest x-ray within normal limits.  "  Assessment and Plan: Sepsis (HCC) Febrile neutropenia Meeting sepsis criteria with temperature one 1.7, heart rate 100s Noted baseline pancytopenia in the setting of stage IV endometrial carcinoma on chemotherapy Follow-up on culture results Continue neutropenia protocol Continue febrile neutropenia coverage with cefepime and vancomycin Continue supplemental IV fluid     Pancytopenia (HCC) White count 0.6, hemoglobin 6.6, Plt 31 in setting of  chemotherapy  Pending pRBC transfusion  Concurrent febrile neutropenia  Monitor CBC closely     Endometrial carcinoma (HCC) Baseline stage IV endometrial carcinoma  Followed by Dr. Smith Robert  Outpatient follow-up with oncologist     Type 2 diabetes mellitus with peripheral angiopathy (HCC) Continue insulin therapy Monitor glucose closely  Hyponatremia SIADH Na 126 on presentation  Euvolemic to mildly dry-body habitus confounding issue  Noted baseline SIADH  IVF in setting of sepsis treatment  Continue salt tablets Continue monitoring of electrolytes Follow-up on urine electrolytes      Coronary artery disease involving native coronary artery of native heart with angina pectoris (HCC) Denies any chest pain Continue with telemetry monitoring     Ischemic cardiomyopathy 2D ECHO 07/2019 w/ EF 45%  Appears euvolemic to mildly dry with concurrent sepsis/febrile neutropenia  Monitor volume status w/ treatment      Hypothyroidism (acquired) Continue home medication   Advance Care Planning:   Code Status: Full Code    Consults: hem-onc    Family Communication: No family at the bedside   Subjective:  Patient seen and examined at bedside this morning Denies nausea vomiting abdominal pain chest pain or cough She tells me she is feeling a little bit better than upon arrival  Physical Exam:  Appearance: She is obese.  HENT:     Head: Normocephalic and atraumatic.     Nose: Nose normal.  Eyes:     Pupils: Pupils are equal, round, and reactive to light.  Cardiovascular:     Rate and Rhythm: Normal rate and regular rhythm.  Pulmonary:     Effort: Pulmonary effort is normal.  Abdominal:     General: Bowel sounds are normal.  Musculoskeletal:        General: Normal range of motion.  Skin:    Coloration: Skin is pale.  Neurological:     General: No focal deficit present.  Psychiatric:        Mood and Affect: Mood norma  Vitals:   11/05/23 0031 11/05/23 0403 11/05/23  0826 11/05/23 1251  BP: (!) 110/57 (!) 128/59 130/76 (!) 149/80  Pulse: (!) 101 (!) 105 (!) 103 (!) 101  Resp: 18 18 (!) 23   Temp: 98.4 F (36.9 C) 98.9 F (37.2 C) 98.1 F (36.7 C) 98.2 F (36.8 C)  TempSrc: Oral Oral Oral   SpO2: 96% 95% 94% 93%    Data Reviewed:  I have reviewed patient chest x-ray that did not show any acute pathology    Latest Ref Rng & Units 11/05/2023    9:44 AM 11/05/2023    1:53 AM 11/04/2023    6:08 PM  CBC  WBC 4.0 - 10.5 K/uL 2.4  1.4    Hemoglobin 12.0 - 15.0 g/dL 7.5  7.4  6.3   Hematocrit 36.0 - 46.0 % 21.1  20.5  18.3   Platelets 150 - 400 K/uL 35  31         Latest Ref Rng & Units 11/05/2023    9:44 AM 11/05/2023    4:56 AM 11/05/2023    1:53 AM  BMP  Glucose 70 - 99 mg/dL 664   403   BUN 6 - 20 mg/dL 17   18   Creatinine 4.74 - 1.00 mg/dL 2.59   5.63   Sodium 875 - 145 mmol/L 129  128  128   Potassium 3.5 - 5.1 mmol/L 3.3   2.7   Chloride 98 - 111 mmol/L 94   92   CO2 22 - 32 mmol/L 27   26   Calcium 8.9 - 10.3 mg/dL 8.1   8.0        Disposition: Home when medically stable  Time spent: 56 minutes  Author: Loyce Dys, MD 11/05/2023 2:18 PM  For on call review [email or web address].

## 2023-11-05 NOTE — Progress Notes (Signed)
 PHARMACY - PHYSICIAN COMMUNICATION CRITICAL VALUE ALERT - BLOOD CULTURE IDENTIFICATION (BCID)  Tonya [name] is an 58 y.o. female who presented to [facility] on 11/04/2023 with a chief complaint of general malaise over the past 1-2 days. 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 She is currently being treated for febrile neutropenia.  Assessment:  1/4 bottles (aerobic) GPC; BCID Staph epidermidis with Mec A/C gene detected  Name of physician (or Provider) Contacted: Manuela Schwartz, NP  Current antibiotics: Cefepime and vancomycin  Changes to prescribed antibiotics recommended:  Patient is on recommended antibiotics - No changes needed  Results for orders placed or performed during the hospital encounter of 10/02/23  Blood Culture ID Panel (Reflexed) (Collected: 10/02/2023  4:13 PM)  Result Value Ref Range   Enterococcus faecalis NOT DETECTED NOT DETECTED   Enterococcus Faecium NOT DETECTED NOT DETECTED   Listeria monocytogenes NOT DETECTED NOT DETECTED   Staphylococcus species NOT DETECTED NOT DETECTED   Staphylococcus aureus (BCID) NOT DETECTED NOT DETECTED   Staphylococcus epidermidis NOT DETECTED NOT DETECTED   Staphylococcus lugdunensis NOT DETECTED NOT DETECTED   Streptococcus species DETECTED (A) NOT DETECTED   Streptococcus agalactiae NOT DETECTED NOT DETECTED   Streptococcus pneumoniae NOT DETECTED NOT DETECTED   Streptococcus pyogenes NOT DETECTED NOT DETECTED   A.calcoaceticus-baumannii NOT DETECTED NOT DETECTED   Bacteroides fragilis NOT DETECTED NOT DETECTED   Enterobacterales NOT DETECTED NOT DETECTED   Enterobacter cloacae complex NOT DETECTED NOT DETECTED   Escherichia coli NOT DETECTED NOT DETECTED   Klebsiella aerogenes NOT DETECTED NOT DETECTED   Klebsiella oxytoca NOT DETECTED NOT DETECTED   Klebsiella pneumoniae NOT  DETECTED NOT DETECTED   Proteus species NOT DETECTED NOT DETECTED   Salmonella species NOT DETECTED NOT DETECTED   Serratia marcescens NOT DETECTED NOT DETECTED   Haemophilus influenzae NOT DETECTED NOT DETECTED   Neisseria meningitidis NOT DETECTED NOT DETECTED   Pseudomonas aeruginosa NOT DETECTED NOT DETECTED   Stenotrophomonas maltophilia NOT DETECTED NOT DETECTED   Candida albicans NOT DETECTED NOT DETECTED   Candida auris NOT DETECTED NOT DETECTED   Candida glabrata NOT DETECTED NOT DETECTED   Candida krusei NOT DETECTED NOT DETECTED   Candida parapsilosis NOT DETECTED NOT DETECTED   Candida tropicalis NOT DETECTED NOT DETECTED   Cryptococcus neoformans/gattii NOT DETECTED NOT DETECTED    Merryl Hacker, PharmD Clinical Pharmacist  11/05/2023  8:58 PM

## 2023-11-06 DIAGNOSIS — R5081 Fever presenting with conditions classified elsewhere: Secondary | ICD-10-CM | POA: Diagnosis not present

## 2023-11-06 DIAGNOSIS — D709 Neutropenia, unspecified: Secondary | ICD-10-CM | POA: Diagnosis not present

## 2023-11-06 LAB — URINALYSIS, W/ REFLEX TO CULTURE (INFECTION SUSPECTED)
Bilirubin Urine: NEGATIVE
Glucose, UA: NEGATIVE mg/dL
Hgb urine dipstick: NEGATIVE
Ketones, ur: 5 mg/dL — AB
Leukocytes,Ua: NEGATIVE
Nitrite: NEGATIVE
Protein, ur: NEGATIVE mg/dL
Specific Gravity, Urine: 1.011 (ref 1.005–1.030)
pH: 5 (ref 5.0–8.0)

## 2023-11-06 LAB — CBC WITH DIFFERENTIAL/PLATELET
Abs Immature Granulocytes: 1.25 10*3/uL — ABNORMAL HIGH (ref 0.00–0.07)
Basophils Absolute: 0 10*3/uL (ref 0.0–0.1)
Basophils Relative: 0 %
Eosinophils Absolute: 0 10*3/uL (ref 0.0–0.5)
Eosinophils Relative: 0 %
HCT: 21.7 % — ABNORMAL LOW (ref 36.0–46.0)
Hemoglobin: 7.6 g/dL — ABNORMAL LOW (ref 12.0–15.0)
Immature Granulocytes: 18 %
Lymphocytes Relative: 13 %
Lymphs Abs: 0.9 10*3/uL (ref 0.7–4.0)
MCH: 30.6 pg (ref 26.0–34.0)
MCHC: 35 g/dL (ref 30.0–36.0)
MCV: 87.5 fL (ref 80.0–100.0)
Monocytes Absolute: 0.4 10*3/uL (ref 0.1–1.0)
Monocytes Relative: 6 %
Neutro Abs: 4.5 10*3/uL (ref 1.7–7.7)
Neutrophils Relative %: 63 %
Platelets: 52 10*3/uL — ABNORMAL LOW (ref 150–400)
RBC: 2.48 MIL/uL — ABNORMAL LOW (ref 3.87–5.11)
RDW: 15.6 % — ABNORMAL HIGH (ref 11.5–15.5)
Smear Review: DECREASED
WBC: 7.2 10*3/uL (ref 4.0–10.5)
nRBC: 0.6 % — ABNORMAL HIGH (ref 0.0–0.2)

## 2023-11-06 LAB — GLUCOSE, CAPILLARY
Glucose-Capillary: 80 mg/dL (ref 70–99)
Glucose-Capillary: 104 mg/dL — ABNORMAL HIGH (ref 70–99)
Glucose-Capillary: 91 mg/dL (ref 70–99)
Glucose-Capillary: 96 mg/dL (ref 70–99)

## 2023-11-06 LAB — OSMOLALITY, URINE: Osmolality, Ur: 295 mosm/kg — ABNORMAL LOW (ref 300–900)

## 2023-11-06 LAB — BASIC METABOLIC PANEL
Anion gap: 13 (ref 5–15)
BUN: 13 mg/dL (ref 6–20)
CO2: 24 mmol/L (ref 22–32)
Calcium: 8.1 mg/dL — ABNORMAL LOW (ref 8.9–10.3)
Chloride: 94 mmol/L — ABNORMAL LOW (ref 98–111)
Creatinine, Ser: 0.63 mg/dL (ref 0.44–1.00)
GFR, Estimated: >60 mL/min (ref >60)
Glucose, Bld: 79 mg/dL (ref 70–99)
Potassium: 3.1 mmol/L — ABNORMAL LOW (ref 3.5–5.1)
Sodium: 131 mmol/L — ABNORMAL LOW (ref 135–145)

## 2023-11-06 LAB — SODIUM, URINE, RANDOM: Sodium, Ur: 47 mmol/L

## 2023-11-06 LAB — CREATININE, URINE, RANDOM: Creatinine, Urine: 35 mg/dL

## 2023-11-06 LAB — MAGNESIUM: Magnesium: 1.9 mg/dL (ref 1.7–2.4)

## 2023-11-06 MED ORDER — POTASSIUM CHLORIDE CRYS ER 20 MEQ PO TBCR
40.0000 meq | EXTENDED_RELEASE_TABLET | ORAL | Status: AC
  Administered 2023-11-06 (×2): 40 meq via ORAL
  Filled 2023-11-06 (×2): qty 2

## 2023-11-06 NOTE — TOC CM/SW Note (Signed)
 Patient is active with [facility] for HHPT.  Alfonso Ramus, LCSW Transitions of Care Department [phone]

## 2023-11-06 NOTE — Plan of Care (Signed)
  Problem: Respiratory: Goal: Ability to maintain adequate ventilation will improve Outcome: Progressing   Problem: Health Behavior/Discharge Planning: Goal: Ability to identify and utilize available resources and services will improve Outcome: Progressing   Problem: Metabolic: Goal: Ability to maintain appropriate glucose levels will improve Outcome: Progressing   Problem: Nutritional: Goal: Progress toward achieving an optimal weight will improve Outcome: Progressing

## 2023-11-06 NOTE — Plan of Care (Signed)

## 2023-11-06 NOTE — Progress Notes (Addendum)
 Progress Note   Patient: Tonya [name]:[id number] DOB: [birth year] DOA: 11/04/2023     2 DOS: the patient was seen and examined on 11/06/2023     Brief hospital course: From H&P "Tonya [name] is a 58 y.o. female with medical history significant of obesity, endometrial cancer, hypertension, SIADH, hypothyroidism, chronic HFpEF, recent diagnosis of C. difficile as well as strep bacteremia presenting with sepsis, pneumonia presenting with sepsis, febrile neutropenia, pancytopenia.  Patient reports generalized malaise over the past 1 to 2 days.  Noted baseline endometrial carcinoma followed by Dr. Smith Robert on IV chemotherapy.  Was seen at oncology clinic earlier today in the setting of pancytopenia.  Redirected to the ER because of symptomatic anemia.  Patient denies any significant symptomatic symptoms apart from cough.  Mild abdominal pain.  No diarrhea, no dysuria.  No focal hemiparesis or confusion.  No headache.  Positive decreased appetite.  Has had little p.o./fluid intake.  Noted admission February 2025 for issues including severe sepsis with C. difficile colitis, streptococcal bacteremia.  Patient has completed courses of oral vancomycin as well as amoxicillin with no other symptoms at present. Presented to the ER Tmax 101.7, heart rate 100s, BP stable.  Satting well on room air.  White count 0.6, hemoglobin 6.6, platelets 31, creatinine 0.73, glucose 157, sodium 126.  Pending PRBC transfusion in the ER.  Lactate 1.6.  Chest x-ray within normal limits.  "   Assessment and Plan: Sepsis (HCC) Febrile neutropenia Meeting sepsis criteria with temperature one 1.7, heart rate 100s Noted baseline pancytopenia in the setting of stage IV endometrial carcinoma on chemotherapy follow-up on culture Continue neutropenia protocol Continue febrile neutropenia coverage with cefepime and vancomycin Continue supplemental IV fluid Continue to monitor ANC   Potassium Continue to replete and monitor potassium  levels  Pancytopenia (HCC) White count 0.6, hemoglobin 6.6, Plt 31 in setting of chemotherapy  Pending pRBC transfusion  Concurrent febrile neutropenia  Monitor CBC closely     Endometrial carcinoma (HCC) Baseline stage IV endometrial carcinoma  Followed by Dr. Smith Robert  Outpatient follow-up with oncologist     Type 2 diabetes mellitus with peripheral angiopathy (HCC) Continue insulin therapy Continue to monitor glucose level   Hyponatremia SIADH Na 126 on presentation  Euvolemic to mildly dry-body habitus confounding issue  Noted baseline SIADH  IVF in setting of sepsis treatment  Continue salt tablet Continue monitoring of electrolytes Follow-up on urine electrolytes       Coronary artery disease involving native coronary artery of native heart with angina pectoris (HCC) Denies any chest pain Continue with telemetry monitoring     Ischemic cardiomyopathy 2D ECHO 07/2019 w/ EF 45%  Appears euvolemic to mildly dry with concurrent sepsis/febrile neutropenia  Monitor volume status w/ treatment      Hypothyroidism (acquired) Continue home medication    Advance Care Planning:   Code Status: Full Code    Consults: hem-onc    Family Communication: No family at the bedside    Subjective:  Patient seen and examined at bedside this morning Denies nausea vomiting abdominal pain She tells me she is feeling better day after day    Physical Exam:   Appearance: She is obese.  HENT:     Head: Normocephalic and atraumatic.     Nose: Nose normal.  Eyes:     Pupils: Pupils are equal, round, and reactive to light.  Cardiovascular:     Rate and Rhythm: Normal rate and regular rhythm.  Pulmonary:     Effort: Pulmonary  effort is normal.  Abdominal:     General: Bowel sounds are normal.  Musculoskeletal:        General: Normal range of motion.  Skin:    Coloration: Skin is pale.  Neurological:     General: No focal deficit present.  Psychiatric:        Mood and  Affect: Mood norma   Data Reviewed:   I have reviewed patient chest x-ray that did not show any acute pathology  Vitals:   11/05/23 2354 11/06/23 0313 11/06/23 0333 11/06/23 0818  BP: (!) 153/75 (!) 155/80  (!) 157/82  Pulse: 100 (!) 101  100  Resp:   (!) 26 (!) 21  Temp: 98.1 F (36.7 C) 98.2 F (36.8 C)  98.6 F (37 C)  TempSrc: Oral Oral  Oral  SpO2: 94% 94%  96%      Latest Ref Rng & Units 11/06/2023    3:26 AM 11/05/2023    9:44 AM 11/05/2023    1:53 AM  CBC  WBC 4.0 - 10.5 K/uL 7.2  2.4  1.4   Hemoglobin 12.0 - 15.0 g/dL 7.6  7.5  7.4   Hematocrit 36.0 - 46.0 % 21.7  21.1  20.5   Platelets 150 - 400 K/uL 52  35  31        Latest Ref Rng & Units 11/06/2023    3:26 AM 11/05/2023    9:44 AM 11/05/2023    4:56 AM  BMP  Glucose 70 - 99 mg/dL 79  161    BUN 6 - 20 mg/dL 13  17    Creatinine 0.96 - 1.00 mg/dL 0.45  4.09    Sodium 811 - 145 mmol/L 131  129  128   Potassium 3.5 - 5.1 mmol/L 3.1  3.3    Chloride 98 - 111 mmol/L 94  94    CO2 22 - 32 mmol/L 24  27    Calcium 8.9 - 10.3 mg/dL 8.1  8.1       Author: Loyce Dys, MD 11/06/2023 2:39 PM  For on call review [email or web address].

## 2023-11-07 DIAGNOSIS — R5081 Fever presenting with conditions classified elsewhere: Secondary | ICD-10-CM | POA: Diagnosis not present

## 2023-11-07 DIAGNOSIS — B957 Other staphylococcus as the cause of diseases classified elsewhere: Secondary | ICD-10-CM

## 2023-11-07 DIAGNOSIS — R7881 Bacteremia: Secondary | ICD-10-CM

## 2023-11-07 DIAGNOSIS — B954 Other streptococcus as the cause of diseases classified elsewhere: Secondary | ICD-10-CM | POA: Diagnosis not present

## 2023-11-07 DIAGNOSIS — D709 Neutropenia, unspecified: Secondary | ICD-10-CM | POA: Diagnosis not present

## 2023-11-07 LAB — BASIC METABOLIC PANEL
Anion gap: 10 (ref 5–15)
BUN: 10 mg/dL (ref 6–20)
CO2: 26 mmol/L (ref 22–32)
Calcium: 8.1 mg/dL — ABNORMAL LOW (ref 8.9–10.3)
Chloride: 97 mmol/L — ABNORMAL LOW (ref 98–111)
Creatinine, Ser: 0.57 mg/dL (ref 0.44–1.00)
GFR, Estimated: >60 mL/min (ref >60)
Glucose, Bld: 103 mg/dL — ABNORMAL HIGH (ref 70–99)
Potassium: 3.5 mmol/L (ref 3.5–5.1)
Sodium: 133 mmol/L — ABNORMAL LOW (ref 135–145)

## 2023-11-07 LAB — CBC WITH DIFFERENTIAL/PLATELET
Abs Immature Granulocytes: 3.64 10*3/uL — ABNORMAL HIGH (ref 0.00–0.07)
Basophils Absolute: 0 10*3/uL (ref 0.0–0.1)
Basophils Relative: 0 %
Eosinophils Absolute: 0 10*3/uL (ref 0.0–0.5)
Eosinophils Relative: 0 %
HCT: 21.7 % — ABNORMAL LOW (ref 36.0–46.0)
Hemoglobin: 7.6 g/dL — ABNORMAL LOW (ref 12.0–15.0)
Immature Granulocytes: 23 %
Lymphocytes Relative: 8 %
Lymphs Abs: 1.3 10*3/uL (ref 0.7–4.0)
MCH: 30.6 pg (ref 26.0–34.0)
MCHC: 35 g/dL (ref 30.0–36.0)
MCV: 87.5 fL (ref 80.0–100.0)
Monocytes Absolute: 1.3 10*3/uL — ABNORMAL HIGH (ref 0.1–1.0)
Monocytes Relative: 8 %
Neutro Abs: 9.5 10*3/uL — ABNORMAL HIGH (ref 1.7–7.7)
Neutrophils Relative %: 61 %
Platelets: 76 10*3/uL — ABNORMAL LOW (ref 150–400)
RBC: 2.48 MIL/uL — ABNORMAL LOW (ref 3.87–5.11)
RDW: 16 % — ABNORMAL HIGH (ref 11.5–15.5)
Smear Review: DECREASED
WBC: 15.8 10*3/uL — ABNORMAL HIGH (ref 4.0–10.5)
nRBC: 0.6 % — ABNORMAL HIGH (ref 0.0–0.2)

## 2023-11-07 LAB — CULTURE, BLOOD (ROUTINE X 2)

## 2023-11-07 LAB — GLUCOSE, CAPILLARY
Glucose-Capillary: 87 mg/dL (ref 70–99)
Glucose-Capillary: 97 mg/dL (ref 70–99)

## 2023-11-07 LAB — MAGNESIUM: Magnesium: 1.8 mg/dL (ref 1.7–2.4)

## 2023-11-07 LAB — PATHOLOGIST SMEAR REVIEW

## 2023-11-07 MED ORDER — NYSTATIN 100000 UNIT/GM EX CREA: TOPICAL_CREAM | Freq: Two times a day (BID) | CUTANEOUS | 0 refills | Status: DC

## 2023-11-07 MED ORDER — HEPARIN SOD (PORK) LOCK FLUSH 100 UNIT/ML IV SOLN
500.0000 [IU] | INTRAVENOUS | Status: AC | PRN
  Administered 2023-11-07: 500 [IU]

## 2023-11-07 MED ORDER — NYSTATIN 100000 UNIT/GM EX CREA
TOPICAL_CREAM | Freq: Two times a day (BID) | CUTANEOUS | Status: DC
  Filled 2023-11-07: qty 30

## 2023-11-07 MED ORDER — FLUCONAZOLE 100 MG PO TABS: 100.0000 mg | ORAL_TABLET | Freq: Every day | ORAL | 0 refills | Status: AC

## 2023-11-07 NOTE — Discharge Summary (Signed)
 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 female with medical history significant of obesity, endometrial cancer, hypertension, SIADH, hypothyroidism, chronic HFpEF, recent diagnosis of C. difficile as well as strep bacteremia presenting with sepsis, pneumonia presenting with sepsis, febrile neutropenia, pancytopenia.  Patient reports generalized malaise over the past 1 to 2 days.  Noted baseline endometrial carcinoma followed by Dr. Smith Robert on IV chemotherapy.  Was seen at oncology clinic earlier today in the setting of pancytopenia.  Redirected to the ER because of symptomatic anemia.  Patient denies any significant symptomatic symptoms apart from cough.  Mild abdominal pain.  No diarrhea, no dysuria.  No focal hemiparesis or confusion.  No headache.  Positive decreased appetite.  Has had little p.o./fluid intake.  Noted admission February 2025 for issues including severe sepsis with C. difficile colitis, streptococcal bacteremia.  Patient has completed courses of oral vancomycin as well as amoxicillin with no other symptoms at present.  Patient underwent blood cultures that showed Staph epidermidis which was found to be skin contaminant after discussion with infectious disease Dr. Joylene Draft. Patient was also found to  have intertrigo and therefore prescribed fluconazole.  She will continue to follow-up with her oncologist who has been informed by  infectious disease.  Patient is doing much better and currently back to her baseline therefore being discharged to follow-up as an outpatient  Consultants: Infectious disease Procedures performed: None Disposition: Home Diet recommendation:  Cardiac diet DISCHARGE MEDICATION: Allergies as of 11/07/2023   No Known Allergies      Medication List     TAKE these medications    fluconazole 100 MG tablet Commonly known as: Diflucan Take 1 tablet (100 mg total) by mouth daily for 7 days.   lidocaine-prilocaine cream Commonly known as: EMLA Apply to affected area once   metoprolol tartrate 25 MG tablet Commonly known as: LOPRESSOR Take 0.5 tablets (12.5 mg total) by mouth 2 (two) times daily.   Multi-Vitamin tablet Take 1 tablet by mouth daily.   nystatin cream Commonly known as: MYCOSTATIN Apply topically 2 (two) times daily.   ondansetron 8 MG tablet Commonly known as: Zofran Take 1 tablet (8 mg total) by mouth every 8 (eight) hours as needed for nausea, vomiting or refractory nausea / vomiting. Start on the third day after carboplatin.   prochlorperazine 10 MG tablet Commonly known as: COMPAZINE Take 1 tablet (10 mg total) by mouth every 6 (six) hours as needed for nausea or vomiting.   sodium chloride 1 g tablet Take 1 tablet (1 g total) by mouth 2 (two) times daily with a meal.   thyroid 120 MG tablet Commonly known as: ARMOUR Take 120 mg by mouth daily before breakfast.   Vitamin D (Ergocalciferol) 1.25 MG (50000 UNIT) Caps capsule Commonly known as: DRISDOL Take 50,000 Units by mouth once a week.   Vitamin D-1000 Max St 25 MCG (1000 UT) tablet Generic drug: Cholecalciferol Take 1,000 Units by  mouth daily.        Discharge Exam: Head: Normocephalic and atraumatic.     Nose: Nose normal.  Eyes:     Pupils: Pupils are equal,  round, and reactive to light.  Cardiovascular:     Rate and Rhythm: Normal rate and regular rhythm.  Pulmonary:     Effort: Pulmonary effort is normal.  Abdominal:     General: Bowel sounds are normal.  Musculoskeletal:        General: Normal range of motion.  Skin:    Coloration: Skin is pale.  Neurological:     General: No focal deficit present.  Psychiatric:        Mood and Affect: Mood norma    Condition at discharge: good  The results of significant diagnostics from this hospitalization (including imaging, microbiology, ancillary and laboratory) are listed below for reference.   Imaging Studies: DG Chest Port 1 View Result Date: 11/04/2023 CLINICAL DATA:  Questionable sepsis - evaluate for abnormality. Tachycardia. Shortness of breath. EXAM: PORTABLE CHEST 1 VIEW COMPARISON:  10/10/2023. FINDINGS: Linear area of scarring/atelectasis noted overlying the left lower lung zone, laterally. There also probable left retrocardiac atelectatic changes. Bilateral lung fields are otherwise clear. No dense consolidation or lung collapse. Bilateral costophrenic angles are clear. Stable cardio-mediastinal silhouette. No acute osseous abnormalities. The soft tissues are within normal limits. Right-sided CT Port-A-Cath is seen with its tip overlying the cavoatrial junction region. IMPRESSION: No active disease. Electronically Signed   By: Jules Schick M.D.   On: 11/04/2023 17:33   US PELVIC COMPLETE WITH TRANSVAGINAL Result Date: 10/12/2023 CLINICAL DATA:  Endometrial carcinoma EXAM: TRANSABDOMINAL AND TRANSVAGINAL ULTRASOUND OF PELVIS TECHNIQUE: Both transabdominal and transvaginal ultrasound examinations of the pelvis were performed. Transabdominal technique was performed for global imaging of the pelvis including uterus, ovaries, adnexal regions, and pelvic cul-de-sac. It was necessary to proceed with endovaginal exam following the transabdominal exam to visualize the uterus. COMPARISON:  CT  10/10/2023 FINDINGS: Uterus Measurements: 11.7 by 6.8 x 6.4 cm = volume: 268 mL. Poorly visible due to habitus and limited imaging window, artifact from air in the vagina. Endometrium Thickness: About 21.6 mm.  Poorly visible Right ovary Not visualized Left ovary Not visualized. Other findings No abnormal free fluid. IMPRESSION: 1. Very limited exam secondary to habitus and shadowing artifact, possibly from air within the vagina. Endometrium appears thickened up to 21.6 mm but is very poorly visualized. 2. Nonvisualized ovaries. Electronically Signed   By: Jasmine Pang M.D.   On: 10/12/2023 17:16   CT Angio Chest Pulmonary Embolism (PE) W or WO Contrast Result Date: 10/10/2023 CLINICAL DATA:  Chest and abdominal pain. Concern for pulmonary embolism. Shortness of breath. History of metastatic disease for cervical cancer and chemotherapy. EXAM: CT ANGIOGRAPHY CHEST CT ABDOMEN AND PELVIS WITH CONTRAST TECHNIQUE: Multidetector CT imaging of the chest was performed using the standard protocol during bolus administration of intravenous contrast. Multiplanar CT image reconstructions and MIPs were obtained to evaluate the vascular anatomy. Multidetector CT imaging of the abdomen and pelvis was performed using the standard protocol during bolus administration of intravenous contrast. RADIATION DOSE REDUCTION: This exam was performed according to the departmental dose-optimization program which includes automated exposure control, adjustment of the mA and/or kV according to patient size and/or use of iterative reconstruction technique. CONTRAST:  OMNIPAQUE IOHEXOL 350 MG/ML SOLN COMPARISON:  Chest radiograph dated 10/10/2023. CT abdomen pelvis dated 08/29/2023. FINDINGS: CTA CHEST FINDINGS Cardiovascular: There is no cardiomegaly or pericardial effusion. Three-vessel coronary  vascular calcification. Right-sided Port-A-Cath with tip at the cavoatrial junction. Mild atherosclerotic calcification of the thoracic aorta.  No aneurysmal dilatation or dissection. The origins of the great vessels of the aortic arch appear patent. No pulmonary artery embolus identified. Mediastinum/Nodes: Top-normal left hilar lymph nodes measure up to 1 cm. Posterior mediastinal adenopathy measure approximately 2 cm in short axis. Top-normal lymph node to the left of the trachea at the level of the thoracic inlet measures 9 mm short axis. Rounded left axillary and subpectoral lymph nodes measure up to 9 mm. The esophagus is grossly unremarkable. No mediastinal fluid collection. Lungs/Pleura: Faint diffuse hazy density throughout the lungs, likely atelectasis. No consolidative changes. There is no pleural effusion or pneumothorax. The central airways are patent. Musculoskeletal: Degenerative changes of the spine. No acute osseous pathology. Review of the MIP images confirms the above findings. CT ABDOMEN and PELVIS FINDINGS No intra-abdominal free air.  Small ascites. Hepatobiliary: The liver is unremarkable. No biliary dilatation. The gallbladder is unremarkable. Pancreas: Unremarkable. No pancreatic ductal dilatation or surrounding inflammatory changes. Spleen: Normal in size without focal abnormality. Adrenals/Urinary Tract: The adrenal glands are unremarkable. There is mild bilateral hydronephrosis relatively similar to prior CT. The visualized ureters appear unremarkable. The urinary bladder is minimally distended. Stomach/Bowel: Mildly dilated fluid-filled loops of small bowel suggestive of enteritis. Gradual transition likely represents a degree of ileus. No definite obstruction. Mild sigmoid diverticulosis. There is loose stool throughout the colon consistent with diarrheal state. The appendix is unremarkable. Vascular/Lymphatic: Mild aortoiliac atherosclerotic disease. The IVC is unremarkable. No portal venous gas. Retroperitoneal adenopathy relatively similar to prior CT. Reproductive: The uterus is anteverted. Air is noted in the vagina. The  ovaries are grossly unremarkable. Other: There is slight progression of omental implants and nodularity since the prior CT. Musculoskeletal: Diffuse subcutaneous edema of the pelvic wall. L1 compression fracture with greater than 50% loss of vertebral body height, new since the prior CT, likely acute or subacute. There is approximately 4 mm retropulsion of the inferior posterior cortex. There is disc desiccation and vacuum phenomena at L1-L2. Review of the MIP images confirms the above findings. IMPRESSION: 1. No acute intrathoracic pathology. No CT evidence of pulmonary artery embolus. 2. Probable enteritis with associated ileus and diarrheal state. Clinical correlation is recommended. 3. Mild bilateral hydronephrosis relatively similar to prior CT. 4. Slight progression of omental implants and nodularity since the prior CT. 5. L1 compression fracture, new since the prior CT, likely acute or subacute. 6.  Aortic Atherosclerosis (ICD10-I70.0). Electronically Signed   By: Elgie Collard M.D.   On: 10/10/2023 20:02   CT ABDOMEN PELVIS W CONTRAST Result Date: 10/10/2023 CLINICAL DATA:  Chest and abdominal pain. Concern for pulmonary embolism. Shortness of breath. History of metastatic disease for cervical cancer and chemotherapy. EXAM: CT ANGIOGRAPHY CHEST CT ABDOMEN AND PELVIS WITH CONTRAST TECHNIQUE: Multidetector CT imaging of the chest was performed using the standard protocol during bolus administration of intravenous contrast. Multiplanar CT image reconstructions and MIPs were obtained to evaluate the vascular anatomy. Multidetector CT imaging of the abdomen and pelvis was performed using the standard protocol during bolus administration of intravenous contrast. RADIATION DOSE REDUCTION: This exam was performed according to the departmental dose-optimization program which includes automated exposure control, adjustment of the mA and/or kV according to patient size and/or use of iterative reconstruction  technique. CONTRAST:  OMNIPAQUE IOHEXOL 350 MG/ML SOLN COMPARISON:  Chest radiograph dated 10/10/2023. CT abdomen pelvis dated 08/29/2023. FINDINGS: CTA CHEST FINDINGS Cardiovascular: There is  no cardiomegaly or pericardial effusion. Three-vessel coronary vascular calcification. Right-sided Port-A-Cath with tip at the cavoatrial junction. Mild atherosclerotic calcification of the thoracic aorta. No aneurysmal dilatation or dissection. The origins of the great vessels of the aortic arch appear patent. No pulmonary artery embolus identified. Mediastinum/Nodes: Top-normal left hilar lymph nodes measure up to 1 cm. Posterior mediastinal adenopathy measure approximately 2 cm in short axis. Top-normal lymph node to the left of the trachea at the level of the thoracic inlet measures 9 mm short axis. Rounded left axillary and subpectoral lymph nodes measure up to 9 mm. The esophagus is grossly unremarkable. No mediastinal fluid collection. Lungs/Pleura: Faint diffuse hazy density throughout the lungs, likely atelectasis. No consolidative changes. There is no pleural effusion or pneumothorax. The central airways are patent. Musculoskeletal: Degenerative changes of the spine. No acute osseous pathology. Review of the MIP images confirms the above findings. CT ABDOMEN and PELVIS FINDINGS No intra-abdominal free air.  Small ascites. Hepatobiliary: The liver is unremarkable. No biliary dilatation. The gallbladder is unremarkable. Pancreas: Unremarkable. No pancreatic ductal dilatation or surrounding inflammatory changes. Spleen: Normal in size without focal abnormality. Adrenals/Urinary Tract: The adrenal glands are unremarkable. There is mild bilateral hydronephrosis relatively similar to prior CT. The visualized ureters appear unremarkable. The urinary bladder is minimally distended. Stomach/Bowel: Mildly dilated fluid-filled loops of small bowel suggestive of enteritis. Gradual transition likely represents a degree of  ileus. No definite obstruction. Mild sigmoid diverticulosis. There is loose stool throughout the colon consistent with diarrheal state. The appendix is unremarkable. Vascular/Lymphatic: Mild aortoiliac atherosclerotic disease. The IVC is unremarkable. No portal venous gas. Retroperitoneal adenopathy relatively similar to prior CT. Reproductive: The uterus is anteverted. Air is noted in the vagina. The ovaries are grossly unremarkable. Other: There is slight progression of omental implants and nodularity since the prior CT. Musculoskeletal: Diffuse subcutaneous edema of the pelvic wall. L1 compression fracture with greater than 50% loss of vertebral body height, new since the prior CT, likely acute or subacute. There is approximately 4 mm retropulsion of the inferior posterior cortex. There is disc desiccation and vacuum phenomena at L1-L2. Review of the MIP images confirms the above findings. IMPRESSION: 1. No acute intrathoracic pathology. No CT evidence of pulmonary artery embolus. 2. Probable enteritis with associated ileus and diarrheal state. Clinical correlation is recommended. 3. Mild bilateral hydronephrosis relatively similar to prior CT. 4. Slight progression of omental implants and nodularity since the prior CT. 5. L1 compression fracture, new since the prior CT, likely acute or subacute. 6.  Aortic Atherosclerosis (ICD10-I70.0). Electronically Signed   By: Elgie Collard M.D.   On: 10/10/2023 20:02   DG Chest 2 View Result Date: 10/10/2023 CLINICAL DATA:  Shortness of breath EXAM: CHEST - 2 VIEW COMPARISON:  Chest radiograph dated 10/02/2023, CT abdomen and pelvis dated 08/29/2023 FINDINGS: Lines/tubes: Right chest wall port tip projects over the superior cavoatrial junction. Lungs: Well inflated lungs. Patchy opacity projecting over the left costophrenic angle. Pleura: No pneumothorax or pleural effusion. Heart/mediastinum: The heart size and mediastinal contours are within normal limits. Bones:  Compression deformity of approximately L1, new from 08/29/2023. IMPRESSION: 1. Patchy opacity projecting over the left costophrenic angle, which may represent atelectasis or pneumonia. 2. New approximately L1 compression deformity. Electronically Signed   By: Agustin Cree M.D.   On: 10/10/2023 15:45    Microbiology: Results for orders placed or performed during the hospital encounter of 11/04/23  Resp panel by RT-PCR (RSV, Flu A&B, Covid) Anterior Nasal Swab  Status: None   Collection Time: 11/04/23  4:17 PM   Specimen: Anterior Nasal Swab  Result Value Ref Range Status   SARS Coronavirus 2 by RT PCR NEGATIVE NEGATIVE Final    Comment: (NOTE) SARS-CoV-2 target nucleic acids are NOT DETECTED.  The SARS-CoV-2 RNA is generally detectable in upper respiratory specimens during the acute phase of infection. The lowest concentration of SARS-CoV-2 viral copies this assay can detect is 138 copies/mL. A negative result does not preclude SARS-Cov-2 infection and should not be used as the sole basis for treatment or other patient management decisions. A negative result may occur with  improper specimen collection/handling, submission of specimen other than nasopharyngeal swab, presence of viral mutation(s) within the areas targeted by this assay, and inadequate number of viral copies(<138 copies/mL). A negative result must be combined with clinical observations, patient history, and epidemiological information. The expected result is Negative.  Fact Sheet for Patients:  BloggerCourse.com  Fact Sheet for Healthcare Providers:  SeriousBroker.it  This test is no t yet approved or cleared by the [place] FDA and  has been authorized for detection and/or diagnosis of SARS-CoV-2 by FDA under an Emergency Use Authorization (EUA). This EUA will remain  in effect (meaning this test can be used) for the duration of the COVID-19 declaration under  Section 564(b)(1) of the Act, 21 U.S.C.section 360bbb-3(b)(1), unless the authorization is terminated  or revoked sooner.       Influenza A by PCR NEGATIVE NEGATIVE Final   Influenza B by PCR NEGATIVE NEGATIVE Final    Comment: (NOTE) The Xpert Xpress SARS-CoV-2/FLU/RSV plus assay is intended as an aid in the diagnosis of influenza from Nasopharyngeal swab specimens and should not be used as a sole basis for treatment. Nasal washings and aspirates are unacceptable for Xpert Xpress SARS-CoV-2/FLU/RSV testing.  Fact Sheet for Patients: BloggerCourse.com  Fact Sheet for Healthcare Providers: SeriousBroker.it  This test is not yet approved or cleared by the [place] FDA and has been authorized for detection and/or diagnosis of SARS-CoV-2 by FDA under an Emergency Use Authorization (EUA). This EUA will remain in effect (meaning this test can be used) for the duration of the COVID-19 declaration under Section 564(b)(1) of the Act, 21 U.S.C. section 360bbb-3(b)(1), unless the authorization is terminated or revoked.     Resp Syncytial Virus by PCR NEGATIVE NEGATIVE Final    Comment: (NOTE) Fact Sheet for Patients: BloggerCourse.com  Fact Sheet for Healthcare Providers: SeriousBroker.it  This test is not yet approved or cleared by the [place] FDA and has been authorized for detection and/or diagnosis of SARS-CoV-2 by FDA under an Emergency Use Authorization (EUA). This EUA will remain in effect (meaning this test can be used) for the duration of the COVID-19 declaration under Section 564(b)(1) of the Act, 21 U.S.C. section 360bbb-3(b)(1), unless the authorization is terminated or revoked.  Performed at [facility], [address] [place], [place] [address]   Culture, blood (Routine X 2) w Reflex to ID Panel     Status: None (Preliminary result)   Collection  Time: 11/04/23  4:18 PM   Specimen: BLOOD  Result Value Ref Range Status   Specimen Description BLOOD PORTA CATH  Final   Special Requests   Final    BOTTLES DRAWN AEROBIC AND ANAEROBIC Blood Culture adequate volume   Culture   Final    NO GROWTH 3 DAYS Performed at [facility], [address] [place], [place] [address]    Report Status PENDING  Incomplete  Culture, blood (Routine X 2) w Reflex to ID Panel     Status: Abnormal   Collection Time: 11/04/23  4:23 PM   Specimen: BLOOD  Result Value Ref Range Status   Specimen Description   Final    BLOOD BLOOD LEFT ARM Performed at [facility], [address], [place], [place] [address]    Special Requests   Final    BOTTLES DRAWN AEROBIC AND ANAEROBIC Blood Culture results may not be optimal due to an inadequate volume of blood received in culture bottles Performed at [facility], [address] [place], [place] [address]    Culture  Setup Time   Final    GRAM POSITIVE COCCI AEROBIC BOTTLE ONLY CRITICAL RESULT CALLED TO, READ BACK BY AND VERIFIED WITH: MADISON HUNT @ 11/05/23 2058 AB    Culture (A)  Final    STAPHYLOCOCCUS EPIDERMIDIS THE SIGNIFICANCE OF ISOLATING THIS ORGANISM FROM A SINGLE SET OF BLOOD CULTURES WHEN MULTIPLE SETS ARE DRAWN IS UNCERTAIN. PLEASE NOTIFY THE MICROBIOLOGY DEPARTMENT WITHIN ONE WEEK IF SPECIATION AND SENSITIVITIES ARE REQUIRED. Performed at [facility], [address] [place], [place] [address]    Report Status 11/07/2023 FINAL  Final  Blood Culture ID Panel (Reflexed)     Status: Abnormal   Collection Time: 11/04/23  4:23 PM  Result Value Ref Range Status   Enterococcus faecalis NOT DETECTED NOT DETECTED Final   Enterococcus Faecium NOT DETECTED NOT DETECTED Final   Listeria monocytogenes NOT DETECTED NOT DETECTED Final   Staphylococcus species DETECTED (A) NOT DETECTED Final    Comment: CRITICAL RESULT CALLED TO, READ BACK BY AND VERIFIED WITH: MADISON  HUNT @ 11/05/23 2058 AB    Staphylococcus aureus (BCID) NOT DETECTED NOT DETECTED Final   Staphylococcus epidermidis DETECTED (A) NOT DETECTED Final    Comment: Methicillin (oxacillin) resistant coagulase negative staphylococcus. Possible blood culture contaminant (unless isolated from more than one blood culture draw or clinical case suggests pathogenicity). No antibiotic treatment is indicated for blood  culture contaminants. CRITICAL RESULT CALLED TO, READ BACK BY AND VERIFIED WITH: MADISON HUNT @ 11/05/23 2058 AB    Staphylococcus lugdunensis NOT DETECTED NOT DETECTED Final   Streptococcus species NOT DETECTED NOT DETECTED Final   Streptococcus agalactiae NOT DETECTED NOT DETECTED Final   Streptococcus pneumoniae NOT DETECTED NOT DETECTED Final   Streptococcus pyogenes NOT DETECTED NOT DETECTED Final   A.calcoaceticus-baumannii NOT DETECTED NOT DETECTED Final   Bacteroides fragilis NOT DETECTED NOT DETECTED Final   Enterobacterales NOT DETECTED NOT DETECTED Final   Enterobacter cloacae complex NOT DETECTED NOT DETECTED Final   Escherichia coli NOT DETECTED NOT DETECTED Final   Klebsiella aerogenes NOT DETECTED NOT DETECTED Final   Klebsiella oxytoca NOT DETECTED NOT DETECTED Final   Klebsiella pneumoniae NOT DETECTED NOT DETECTED Final   Proteus species NOT DETECTED NOT DETECTED Final   Salmonella species NOT DETECTED NOT DETECTED Final   Serratia marcescens NOT DETECTED NOT DETECTED Final   Haemophilus influenzae NOT DETECTED NOT DETECTED Final   Neisseria meningitidis NOT DETECTED NOT DETECTED Final   Pseudomonas aeruginosa NOT DETECTED NOT DETECTED Final   Stenotrophomonas maltophilia NOT DETECTED NOT DETECTED Final   Candida albicans NOT DETECTED NOT DETECTED Final   Candida auris NOT DETECTED NOT DETECTED Final   Candida glabrata NOT DETECTED NOT DETECTED Final   Candida krusei NOT DETECTED NOT DETECTED Final   Candida parapsilosis NOT DETECTED NOT DETECTED Final   Candida  tropicalis NOT DETECTED  NOT DETECTED Final   Cryptococcus neoformans/gattii NOT DETECTED NOT DETECTED Final   Methicillin resistance mecA/C DETECTED (A) NOT DETECTED Final    Comment: CRITICAL RESULT CALLED TO, READ BACK BY AND VERIFIED WITH: MADISON HUNT @ 11/05/23 2058 AB Performed at [facility], [address] [place], [place] [address]     Labs: CBC: Recent Labs  Lab 11/04/23 1333 11/04/23 1333 11/04/23 1603 11/04/23 1808 11/05/23 0153 11/05/23 0944 11/06/23 0326 11/07/23 0520  WBC 0.6*  --  0.6*  --  1.4* 2.4* 7.2 15.8*  NEUTROABS 0.3*  --  0.3*  --   --  1.4* 4.5 9.5*  HGB 6.6*   < > 9.8* 6.3* 7.4* 7.5* 7.6* 7.6*  HCT 19.4*  --  28.3* 18.3* 20.5* 21.1* 21.7* 21.7*  MCV 90.2  --  89.8  --  87.2 85.4 87.5 87.5  PLT 31*  --  23*  --  31* 35* 52* 76*   < > = values in this interval not displayed.   Basic Metabolic Panel: Recent Labs  Lab 11/04/23 1603 11/04/23 1744 11/05/23 0153 11/05/23 0456 11/05/23 0944 11/06/23 0326 11/07/23 0520  NA 127*   < > 128* 128* 129* 131* 133*  K 3.0*  --  2.7*  --  3.3* 3.1* 3.5  CL 90*  --  92*  --  94* 94* 97*  CO2 25  --  26  --  27 24 26   GLUCOSE 155*  --  122*  --  107* 79 103*  BUN 17  --  18  --  17 13 10   CREATININE 0.64  --  0.52  --  0.69 0.63 0.57  CALCIUM 8.3*  --  8.0*  --  8.1* 8.1* 8.1*  MG  --   --   --  1.6*  --  1.9 1.8   < > = values in this interval not displayed.   Liver Function Tests: Recent Labs  Lab 11/05/23 0153  AST 13*  ALT 14  ALKPHOS 69  BILITOT 1.0  PROT 5.7*  ALBUMIN 2.2*   CBG: Recent Labs  Lab 11/06/23 1205 11/06/23 1637 11/06/23 2136 11/07/23 0800 11/07/23 1220  GLUCAP 104* 91 96 87 97    Discharge time spent:  37 minutes.  Signed: Loyce Dys, MD [facility] 11/07/2023

## 2023-11-07 NOTE — Progress Notes (Signed)
 PT Cancellation Note  Patient Details Name: Tonya [name] MRN: [id number] DOB: March 21, 1966   Cancelled Treatment:    Reason Eval/Treat Not Completed: PT screened, no needs identified, will sign off Orders received, chart reviewed. Patient reports she has all necessary equipment and active with HHPT with plan to discharge home this PM. She does not feel like she needs PT prior to discharging home this PM. PT will sign off at this time.   Maylon Peppers, PT, DPT Physical Therapist - [facility]  [facility]    Ellionna Buckbee A Reggie Welge 11/07/2023, 3:03 PM

## 2023-11-07 NOTE — TOC Transition Note (Signed)
 Transition of Care [facility]) - Discharge Note   Patient Details  Name: Tonya [name] MRN: [id number] Date of Birth: [birth year]  Transition of Care [facility] CM/SW Contact:  Truddie Hidden, RN Phone Number: 11/07/2023, 2:50 PM   Clinical Narrative:    Patient discharging home.  Cory from [place] notified.   TOC signing off.           Patient Goals and CMS Choice            Discharge Placement                       Discharge Plan and Services Additional resources added to the After Visit Summary for                                       Social Drivers of Health (SDOH) Interventions SDOH Screenings   Food Insecurity: No Food Insecurity (11/05/2023)  Housing: Low Risk  (11/05/2023)  Transportation Needs: No Transportation Needs (11/05/2023)  Utilities: Not At Risk (11/05/2023)  Depression (PHQ2-9): Low Risk  (09/21/2023)  Financial Resource Strain: Low Risk  (07/23/2019)  Physical Activity: Unknown (07/23/2019)  Social Connections: Moderately Integrated (10/11/2023)  Stress: No Stress Concern Present (07/23/2019)  Tobacco Use: Low Risk  (11/04/2023)  Health Literacy: Adequate Health Literacy (09/29/2023)     Readmission Risk Interventions    10/05/2023    2:26 PM  Readmission Risk Prevention Plan  Transportation Screening Complete  HRI or Home Care Consult Complete  Palliative Care Screening Complete  Medication Review (RN Care Manager) Complete

## 2023-11-07 NOTE — Plan of Care (Signed)
  Problem: Fluid Volume: Goal: Hemodynamic stability will improve Outcome: Progressing   Problem: Clinical Measurements: Goal: Diagnostic test results will improve Outcome: Progressing Goal: Signs and symptoms of infection will decrease Outcome: Progressing   Problem: Respiratory: Goal: Ability to maintain adequate ventilation will improve Outcome: Progressing   Problem: Education: Goal: Ability to describe self-care measures that may prevent or decrease complications (Diabetes Survival Skills Education) will improve Outcome: Progressing Goal: Individualized Educational Video(s) Outcome: Progressing   Problem: Coping: Goal: Ability to adjust to condition or change in health will improve Outcome: Progressing   Problem: Fluid Volume: Goal: Ability to maintain a balanced intake and output will improve Outcome: Progressing   Problem: Health Behavior/Discharge Planning: Goal: Ability to identify and utilize available resources and services will improve Outcome: Progressing Goal: Ability to manage health-related needs will improve Outcome: Progressing   Problem: Metabolic: Goal: Ability to maintain appropriate glucose levels will improve Outcome: Progressing   Problem: Nutritional: Goal: Maintenance of adequate nutrition will improve Outcome: Progressing Goal: Progress toward achieving an optimal weight will improve Outcome: Progressing   Problem: Skin Integrity: Goal: Risk for impaired skin integrity will decrease Outcome: Progressing   Problem: Tissue Perfusion: Goal: Adequacy of tissue perfusion will improve Outcome: Progressing   Problem: Education: Goal: Knowledge of General Education information will improve Description: Including pain rating scale, medication(s)/side effects and non-pharmacologic comfort measures Outcome: Progressing   Problem: Health Behavior/Discharge Planning: Goal: Ability to manage health-related needs will improve Outcome:  Progressing   Problem: Clinical Measurements: Goal: Ability to maintain clinical measurements within normal limits will improve Outcome: Progressing Goal: Will remain free from infection Outcome: Progressing Goal: Diagnostic test results will improve Outcome: Progressing Goal: Respiratory complications will improve Outcome: Progressing Goal: Cardiovascular complication will be avoided Outcome: Progressing   Problem: Activity: Goal: Risk for activity intolerance will decrease Outcome: Progressing   Problem: Elimination: Goal: Will not experience complications related to bowel motility Outcome: Progressing Goal: Will not experience complications related to urinary retention Outcome: Progressing   Problem: Coping: Goal: Level of anxiety will decrease Outcome: Progressing   Problem: Pain Managment: Goal: General experience of comfort will improve and/or be controlled Outcome: Progressing   Problem: Safety: Goal: Ability to remain free from injury will improve Outcome: Progressing

## 2023-11-07 NOTE — Progress Notes (Signed)
 Discharge instructions given to and reviewed with patient. Patient verbalized understanding of all discharge instructions including follow up care, new prescriptions and medication regimen. Patient left unit by home wheelchair with husband who is driving her home.

## 2023-11-07 NOTE — Consult Note (Signed)
 NAME: Tonya [name]  DOB: Jun 15, 1966  MRN: [id number]  Date/Time: 11/07/2023 11:08 AM  REQUESTING PROVIDER: Dr.Djan Subjective:  REASON FOR CONSULT: febrile neutropenia ? Tonya [name] is a 58 y.o. with a history of 58 y.o. 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  It was thought to be due to [facility] and she was given laxatives.  On August 29, 2023 when she saw her PCP and the pain was worsening he sent her for a stat CT scan.  And that showed extensive abdominal and retroperitoneal adenopathy, uterine enlargement and suggestion of soft tissue fullness within the lower uterine segment and cervix.  There was periuterine interstitial thickening.  Omental nodularity was noted.  Peritoneal thickening was noted.  The impression was widespread metastatic disease including to the abdominal pelvic and lower thoracic nodes omentum and lungs.  Mild bilateral hydronephrosis and proximal hydroureter.  She was referred to GYN oncology and saw them on 09/01/2023 and underwent pelvic examination and biopsies and Pap smear.  But that was limited.  The cervical biopsy was consistent with squamous mucosa and abundant necrotic debris but not definitely diagnostic for malignancy.  FNA of the retroperitoneal lymph node was done on 09/13/23 and showed poorly differentiated carcinoma with neuroendocrine features with mets to L.nodes and peritoneum .  She saw oncology Dr. Smith Robert on 09/21/2023.  Port was placed on 09/23/2023 by interventional radiology..  Patient got her first chemo on 09/26/2023. 09/27/23  and 09/28/23 She received carboplatin and etoposide and also received Decadron,  She got pegfilgrastim on 09/30/23- she was hospitalized in February for Neutropenia, streptococcus viridans bacteremia , actinomyces bacteremia 1/4 both thought to be translocation from GI tract. She had TVS , pelvic ultrasound to r/o any PID or uterine infection and none identified She was treated with IV ceftriaxone followed by PO amoxicillin 1 gram TID for 2 weeks with plan to repeat blood culture post antibiotic She also had cdiff toxic positive and antigen neg and got 2 weeks of Po vanco Pt was doing well until she got her 2nd dose of chemo  11/04/23 13:45  BP 130/72  Temp 98.4 F (36.9 C)  Pulse Rate 125 !  Resp 18  SpO2 96 %  T max 101.3   Latest Reference Range & Units 11/04/23 16:03  WBC 4.0 - 10.5 K/uL 0.6 (LL) [1]  Hemoglobin 12.0 - 15.0 g/dL 9.8 (L)  HCT 40.9 - 81.1 % 28.3 (L)  Platelets 150 - 400 K/uL 23 (LL) [2]  Creatinine 0.44 - 1.00 mg/dL 9.14   Blood cultrue sent CXR no infiltrate UA N She was started on vanco cefepime for febrile neutropenia Culture 1/4 staph epidermidis and I asked to see the patient for that and febrile neutropenia  This time around patient has recovered pretty quickly No fever in 36 hrs Neutropenia resolved with peg Figrastim She received PRBC  Past Medical History:  Diagnosis Date   Cancer [facility])    Coronary artery disease    GERD (gastroesophageal reflux disease)    Hypertension    Hypothyroidism    Myocardial infarction (HCC)  Thyroid disease     Past Surgical History:  Procedure Laterality Date   CARDIAC CATHETERIZATION     CESAREAN SECTION     CORONARY STENT INTERVENTION N/A 07/24/2019   Procedure: CORONARY STENT INTERVENTION;  Surgeon: Yvonne Kendall, MD;  Location: [facility] INVASIVE CV LAB;  Service: Cardiovascular;  Laterality: N/A;  RCA & CFX   IR IMAGING GUIDED PORT INSERTION  09/23/2023   LEFT HEART CATH AND CORONARY ANGIOGRAPHY N/A 07/24/2019    Procedure: LEFT HEART CATH AND CORONARY ANGIOGRAPHY;  Surgeon: Lamar Blinks, MD;  Location: [facility] INVASIVE CV LAB;  Service: Cardiovascular;  Laterality: N/A;   TONSILLECTOMY      Social History   Socioeconomic History   Marital status: Married    Spouse name: Deserea Bordley   Number of children: 1   Years of education: Not on file   Highest education level: Not on file  Occupational History   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)  Tobacco Use   Smoking status: Never   Smokeless tobacco: Never  Vaping Use   Vaping status: Never Used  Substance and Sexual Activity   Alcohol use: Not Currently    Comment: occ   Drug use: Never   Sexual activity: Not Currently  Other Topics Concern   Not on file  Social History Narrative   Not on file   Social Drivers of Health   Financial Resource Strain: Low Risk  (07/23/2019)   Overall Financial Resource Strain (CARDIA)    Difficulty of Paying Living Expenses: Not hard at all  Food Insecurity: No Food Insecurity (11/05/2023)   Hunger Vital Sign    Worried About Running Out of Food in the Last Year: Never true    Ran Out of Food in the Last Year: Never true  Transportation Needs: No Transportation Needs (11/05/2023)   PRAPARE - Administrator, Civil Service (Medical): No    Lack of Transportation (Non-Medical): No  Physical Activity: Unknown (07/23/2019)   Exercise Vital Sign    Days of Exercise per Week: 2 days    Minutes of Exercise per Session: Not on file  Stress: No Stress Concern Present (07/23/2019)   [employer] of Occupational Health - Occupational Stress Questionnaire    Feeling of Stress : Not at all  Social Connections: Moderately Integrated (10/11/2023)   Social Connection and Isolation Panel [NHANES]    Frequency of Communication with Friends and Family: More than three times a week    Frequency of Social Gatherings with Friends and Family: Twice a week    Attends Religious Services: 1 to 4 times per  year    Active Member of [employer] or Organizations: No    Attends Banker Meetings: Never    Marital Status: Married  Catering manager Violence: Not At Risk (11/05/2023)   Humiliation, Afraid, Rape, and Kick questionnaire    Fear of Current or Ex-Partner: No    Emotionally Abused: No    Physically Abused: No    Sexually Abused: No    Family History  Problem Relation Age of Onset   Atrial fibrillation Mother        Passed January 2024.   Colon cancer Mother        Diagnosed 2007   Heart Problems Father    Heart attack Father    Heart disease Father    No Known Allergies I? Current Facility-Administered Medications  Medication Dose Route Frequency Provider Last Rate Last Admin   0.9 %  sodium  chloride infusion  10 mL/hr Intravenous Once Willy Eddy, MD       acetaminophen (TYLENOL) tablet 650 mg  650 mg Oral Q6H PRN Floydene Flock, MD       ceFEPIme (MAXIPIME) 2 g in sodium chloride 0.9 % 100 mL IVPB  2 g Intravenous Q8H Willy Eddy, MD 200 mL/hr at 11/07/23 0800 Infusion Verify at 11/07/23 0800   Chlorhexidine Gluconate Cloth 2 % PADS 6 each  6 each Topical Daily Floydene Flock, MD   6 each at 11/07/23 0805   insulin aspart (novoLOG) injection 0-9 Units  0-9 Units Subcutaneous TID WC Floydene Flock, MD       lactated ringers infusion   Intravenous Continuous Floydene Flock, MD 100 mL/hr at 11/07/23 0800 Infusion Verify at 11/07/23 0800   nystatin (MYCOSTATIN/NYSTOP) topical powder   Topical TID Manuela Schwartz, NP   Given at 11/07/23 0752   ondansetron (ZOFRAN) tablet 4 mg  4 mg Oral Q6H PRN Floydene Flock, MD       Or   ondansetron [facility]) injection 4 mg  4 mg Intravenous Q6H PRN Floydene Flock, MD       Oral care mouth rinse  15 mL Mouth Rinse PRN Loyce Dys, MD       protein supplement (ENSURE MAX) liquid  11 oz Oral TID WC PRN Floydene Flock, MD       sodium chloride flush (NS) 0.9 % injection 10-40 mL  10-40 mL Intracatheter Q12H  Floydene Flock, MD   10 mL at 11/06/23 2109   sodium chloride flush (NS) 0.9 % injection 10-40 mL  10-40 mL Intracatheter PRN Floydene Flock, MD       sodium chloride tablet 1 g  1 g Oral BID WC Floydene Flock, MD   1 g at 11/07/23 6160   thyroid (ARMOUR) tablet 120 mg  120 mg Oral QAC breakfast Rosezetta Schlatter T, MD   120 mg at 11/07/23 0751   vancomycin (VANCOREADY) IVPB 1750 mg/350 mL  1,750 mg Intravenous Q24H Merryl Hacker, [place]   Stopped at 11/06/23 1937     Abtx:  Anti-infectives (From admission, onward)    Start     Dose/Rate Route Frequency Ordered Stop   11/05/23 1700  vancomycin (VANCOREADY) IVPB 1750 mg/350 mL        1,750 mg 175 mL/hr over 120 Minutes Intravenous Every 24 hours 11/04/23 1655     11/04/23 1700  vancomycin (VANCOCIN) IVPB 1000 mg/200 mL premix       Placed in "And" Linked Group   1,000 mg 200 mL/hr over 60 Minutes Intravenous  Once 11/04/23 1655 11/04/23 1935   11/04/23 1700  vancomycin (VANCOREADY) IVPB 1500 mg/300 mL       Placed in "And" Linked Group   1,500 mg 150 mL/hr over 120 Minutes Intravenous  Once 11/04/23 1655 11/04/23 2247   11/04/23 1630  ceFEPIme (MAXIPIME) 2 g in sodium chloride 0.9 % 100 mL IVPB        2 g 200 mL/hr over 30 Minutes Intravenous Every 8 hours 11/04/23 1618         REVIEW OF SYSTEMS:  Const:  fever, negative chills, negative weight loss Eyes: negative diplopia or visual changes, negative eye pain ENT: negative coryza, negative sore throat Resp: negative cough, hemoptysis, some dyspnea Cards: negative for chest pain, palpitations, lower extremity edema GU: negative for frequency, dysuria and hematuria GI: Negative for abdominal pain, diarrhea,  bleeding, constipation Skin: negative for rash and pruritus Heme: negative for easy bruising and gum/nose bleeding MS: muscle weakness Neurolo:negative for headaches, dizziness, vertigo, memory problems  Psych: negative for feelings of anxiety, depression  Endocrine:  negative for thyroid, diabetes Allergy/Immunology- as above  Objective:  VITALS:  BP (!) 158/84 (BP Location: Left Arm)   Pulse 97   Temp 97.9 F (36.6 C) (Oral)   Resp 18   LMP 07/22/2016   SpO2 98%  LDA PORT PHYSICAL EXAM:  General: Alert, cooperative, no distress, appears stated age. pale Head: Normocephalic, without obvious abnormality, atraumatic. Eyes: Conjunctivae clear, anicteric sclerae. Pupils are equal ENT Nares normal. No drainage or sinus tenderness. Lips, mucosa, and tongue normal. No Thrush Neck: Supple, symmetrical, no adenopathy, thyroid: non tender no carotid bruit and no JVD. Back: No CVA tenderness. Lungs: b/la ir entry Heart: Regular rate and rhythm, no murmur, rub or gallop. Abdomen: Soft, obese . Bowel sounds normal. No masses Extremities: atraumatic, no cyanosis. No edema. No clubbing Skin: intertrigo under breast , inguinal area Lymph: Cervical, supraclavicular normal. Neurologic: Grossly non-focal Pertinent Labs Lab Results CBC    Component Value Date/Time   WBC 15.8 (H) 11/07/2023 0520   RBC 2.48 (L) 11/07/2023 0520   HGB 7.6 (L) 11/07/2023 0520   HGB 6.6 (LL) 11/04/2023 1333   HCT 21.7 (L) 11/07/2023 0520   PLT 76 (L) 11/07/2023 0520   PLT 31 (L) 11/04/2023 1333   MCV 87.5 11/07/2023 0520   MCH 30.6 11/07/2023 0520   MCHC 35.0 11/07/2023 0520   RDW 16.0 (H) 11/07/2023 0520   LYMPHSABS 1.3 11/07/2023 0520   MONOABS 1.3 (H) 11/07/2023 0520   EOSABS 0.0 11/07/2023 0520   BASOSABS 0.0 11/07/2023 0520       Latest Ref Rng & Units 11/07/2023    5:20 AM 11/06/2023    3:26 AM 11/05/2023    9:44 AM  CMP  Glucose 70 - 99 mg/dL 161  79  096   BUN 6 - 20 mg/dL 10  13  17    Creatinine 0.44 - 1.00 mg/dL 0.45  4.09  8.11   Sodium 135 - 145 mmol/L 133  131  129   Potassium 3.5 - 5.1 mmol/L 3.5  3.1  3.3   Chloride 98 - 111 mmol/L 97  94  94   CO2 22 - 32 mmol/L 26  24  27    Calcium 8.9 - 10.3 mg/dL 8.1  8.1  8.1        Microbiology: Recent Results (from the past 240 hours)  Resp panel by RT-PCR (RSV, Flu A&B, Covid) Anterior Nasal Swab     Status: None   Collection Time: 11/04/23  4:17 PM   Specimen: Anterior Nasal Swab  Result Value Ref Range Status   SARS Coronavirus 2 by RT PCR NEGATIVE NEGATIVE Final    Comment: (NOTE) SARS-CoV-2 target nucleic acids are NOT DETECTED.  The SARS-CoV-2 RNA is generally detectable in upper respiratory specimens during the acute phase of infection. The lowest concentration of SARS-CoV-2 viral copies this assay can detect is 138 copies/mL. A negative result does not preclude SARS-Cov-2 infection and should not be used as the sole basis for treatment or other patient management decisions. A negative result may occur with  improper specimen collection/handling, submission of specimen other than nasopharyngeal swab, presence of viral mutation(s) within the areas targeted by this assay, and inadequate number of viral copies(<138 copies/mL). A negative result must be combined with clinical observations, patient history,  and epidemiological information. The expected result is Negative.  Fact Sheet for Patients:  BloggerCourse.com  Fact Sheet for Healthcare Providers:  SeriousBroker.it  This test is no t yet approved or cleared by the [place] FDA and  has been authorized for detection and/or diagnosis of SARS-CoV-2 by FDA under an Emergency Use Authorization (EUA). This EUA will remain  in effect (meaning this test can be used) for the duration of the COVID-19 declaration under Section 564(b)(1) of the Act, 21 U.S.C.section 360bbb-3(b)(1), unless the authorization is terminated  or revoked sooner.       Influenza A by PCR NEGATIVE NEGATIVE Final   Influenza B by PCR NEGATIVE NEGATIVE Final    Comment: (NOTE) The Xpert Xpress SARS-CoV-2/FLU/RSV plus assay is intended as an aid in the diagnosis of  influenza from Nasopharyngeal swab specimens and should not be used as a sole basis for treatment. Nasal washings and aspirates are unacceptable for Xpert Xpress SARS-CoV-2/FLU/RSV testing.  Fact Sheet for Patients: BloggerCourse.com  Fact Sheet for Healthcare Providers: SeriousBroker.it  This test is not yet approved or cleared by the [place] FDA and has been authorized for detection and/or diagnosis of SARS-CoV-2 by FDA under an Emergency Use Authorization (EUA). This EUA will remain in effect (meaning this test can be used) for the duration of the COVID-19 declaration under Section 564(b)(1) of the Act, 21 U.S.C. section 360bbb-3(b)(1), unless the authorization is terminated or revoked.     Resp Syncytial Virus by PCR NEGATIVE NEGATIVE Final    Comment: (NOTE) Fact Sheet for Patients: BloggerCourse.com  Fact Sheet for Healthcare Providers: SeriousBroker.it  This test is not yet approved or cleared by the [place] FDA and has been authorized for detection and/or diagnosis of SARS-CoV-2 by FDA under an Emergency Use Authorization (EUA). This EUA will remain in effect (meaning this test can be used) for the duration of the COVID-19 declaration under Section 564(b)(1) of the Act, 21 U.S.C. section 360bbb-3(b)(1), unless the authorization is terminated or revoked.  Performed at [facility], [address] [place], [place] [address]   Culture, blood (Routine X 2) w Reflex to ID Panel     Status: None (Preliminary result)   Collection Time: 11/04/23  4:18 PM   Specimen: BLOOD  Result Value Ref Range Status   Specimen Description BLOOD PORTA CATH  Final   Special Requests   Final    BOTTLES DRAWN AEROBIC AND ANAEROBIC Blood Culture adequate volume   Culture   Final    NO GROWTH 3 DAYS Performed at [facility], [address], [place],  [place] [address]    Report Status PENDING  Incomplete  Culture, blood (Routine X 2) w Reflex to ID Panel     Status: Abnormal   Collection Time: 11/04/23  4:23 PM   Specimen: BLOOD  Result Value Ref Range Status   Specimen Description   Final    BLOOD BLOOD LEFT ARM Performed at [facility], [address] [place], [place] [address]    Special Requests   Final    BOTTLES DRAWN AEROBIC AND ANAEROBIC Blood Culture results may not be optimal due to an inadequate volume of blood received in culture bottles Performed at [facility], [address] [place], [place] [address]    Culture  Setup Time   Final    GRAM POSITIVE COCCI AEROBIC BOTTLE ONLY CRITICAL RESULT CALLED TO, READ BACK BY AND VERIFIED WITH: MADISON HUNT @ 11/05/23 2058 AB  Culture (A)  Final    STAPHYLOCOCCUS EPIDERMIDIS THE SIGNIFICANCE OF ISOLATING THIS ORGANISM FROM A SINGLE SET OF BLOOD CULTURES WHEN MULTIPLE SETS ARE DRAWN IS UNCERTAIN. PLEASE NOTIFY THE MICROBIOLOGY DEPARTMENT WITHIN ONE WEEK IF SPECIATION AND SENSITIVITIES ARE REQUIRED. Performed at [facility], [address] [place], [place] [address]    Report Status 11/07/2023 FINAL  Final  Blood Culture ID Panel (Reflexed)     Status: Abnormal   Collection Time: 11/04/23  4:23 PM  Result Value Ref Range Status   Enterococcus faecalis NOT DETECTED NOT DETECTED Final   Enterococcus Faecium NOT DETECTED NOT DETECTED Final   Listeria monocytogenes NOT DETECTED NOT DETECTED Final   Staphylococcus species DETECTED (A) NOT DETECTED Final    Comment: CRITICAL RESULT CALLED TO, READ BACK BY AND VERIFIED WITH: MADISON HUNT @ 11/05/23 2058 AB    Staphylococcus aureus (BCID) NOT DETECTED NOT DETECTED Final   Staphylococcus epidermidis DETECTED (A) NOT DETECTED Final    Comment: Methicillin (oxacillin) resistant coagulase negative staphylococcus. Possible blood culture contaminant (unless isolated from more than one blood culture draw or  clinical case suggests pathogenicity). No antibiotic treatment is indicated for blood  culture contaminants. CRITICAL RESULT CALLED TO, READ BACK BY AND VERIFIED WITH: MADISON HUNT @ 11/05/23 2058 AB    Staphylococcus lugdunensis NOT DETECTED NOT DETECTED Final   Streptococcus species NOT DETECTED NOT DETECTED Final   Streptococcus agalactiae NOT DETECTED NOT DETECTED Final   Streptococcus pneumoniae NOT DETECTED NOT DETECTED Final   Streptococcus pyogenes NOT DETECTED NOT DETECTED Final   A.calcoaceticus-baumannii NOT DETECTED NOT DETECTED Final   Bacteroides fragilis NOT DETECTED NOT DETECTED Final   Enterobacterales NOT DETECTED NOT DETECTED Final   Enterobacter cloacae complex NOT DETECTED NOT DETECTED Final   Escherichia coli NOT DETECTED NOT DETECTED Final   Klebsiella aerogenes NOT DETECTED NOT DETECTED Final   Klebsiella oxytoca NOT DETECTED NOT DETECTED Final   Klebsiella pneumoniae NOT DETECTED NOT DETECTED Final   Proteus species NOT DETECTED NOT DETECTED Final   Salmonella species NOT DETECTED NOT DETECTED Final   Serratia marcescens NOT DETECTED NOT DETECTED Final   Haemophilus influenzae NOT DETECTED NOT DETECTED Final   Neisseria meningitidis NOT DETECTED NOT DETECTED Final   Pseudomonas aeruginosa NOT DETECTED NOT DETECTED Final   Stenotrophomonas maltophilia NOT DETECTED NOT DETECTED Final   Candida albicans NOT DETECTED NOT DETECTED Final   Candida auris NOT DETECTED NOT DETECTED Final   Candida glabrata NOT DETECTED NOT DETECTED Final   Candida krusei NOT DETECTED NOT DETECTED Final   Candida parapsilosis NOT DETECTED NOT DETECTED Final   Candida tropicalis NOT DETECTED NOT DETECTED Final   Cryptococcus neoformans/gattii NOT DETECTED NOT DETECTED Final   Methicillin resistance mecA/C DETECTED (A) NOT DETECTED Final    Comment: CRITICAL RESULT CALLED TO, READ BACK BY AND VERIFIED WITH: MADISON HUNT @ 11/05/23 2058 AB Performed at [facility], [address] [place], [place] [address]     IMAGING RESULTS: I have personally reviewed the films ? Impression/Recommendation Febrile neutropenia following chemo Resolved On vanco /cefepime- can be discontinued- none needed  Staph epidermidis 1/4 in blood culture a skin contaminant - no rx needed PORT is not the source and okay to keep it  Poorly differentiated ca of the uterus/cervix- with mets to omentum and LN- on chemo  Strep viridans and actinomyces bacteremia after first chemo- s/p antibiotic for 2 weeks No evidence of deep seated actino infection in the pelvis- So no  prolonged course antibiotic was needed Now the repeeat culture this admission these bacteria are not seen  Anemia- received PRBC Thrombocytopenia post chemo  Hypokalemia resolved Hyponatremia improved  H/o cdiff diarrhea    ? ? _I have personally spent  -75--minutes involved in face-to-face and non-face-to-face activities for this patient on the day of the visit. Professional time spent includes the following activities: Preparing to see the patient (review of tests), Obtaining and/or reviewing separately obtained history (admission/discharge record), Performing a medically appropriate examination and/or evaluation , Ordering medications/tests/procedures, referring and communicating with other health care professionals, Documenting clinical information in the EMR, Independently interpreting results (not separately reported), Communicating results to the patient/family/caregiver, Counseling and educating the patient/family/caregiver and Care coordination (not separately reported). ______________________________________________ Note:  This document was prepared using Conservation officer, historic buildings and may include unintentional dictation errors.

## 2023-11-09 LAB — CULTURE, BLOOD (ROUTINE X 2)
Culture: NO GROWTH
Special Requests: ADEQUATE

## 2023-11-14 ENCOUNTER — Encounter: Payer: Self-pay | Admitting: Oncology

## 2023-11-14 ENCOUNTER — Ambulatory Visit

## 2023-11-14 ENCOUNTER — Inpatient Hospital Stay

## 2023-11-14 ENCOUNTER — Inpatient Hospital Stay: Admitting: Oncology

## 2023-11-14 ENCOUNTER — Inpatient Hospital Stay (HOSPITAL_BASED_OUTPATIENT_CLINIC_OR_DEPARTMENT_OTHER): Admitting: Oncology

## 2023-11-14 VITALS — HR 94

## 2023-11-14 VITALS — BP 142/81 | HR 101 | Temp 98.0°F | Resp 16 | Ht 64.0 in | Wt 238.0 lb

## 2023-11-14 DIAGNOSIS — Z5111 Encounter for antineoplastic chemotherapy: Secondary | ICD-10-CM | POA: Diagnosis not present

## 2023-11-14 DIAGNOSIS — D6481 Anemia due to antineoplastic chemotherapy: Secondary | ICD-10-CM | POA: Diagnosis not present

## 2023-11-14 DIAGNOSIS — T451X5A Adverse effect of antineoplastic and immunosuppressive drugs, initial encounter: Secondary | ICD-10-CM

## 2023-11-14 DIAGNOSIS — C7A8 Other malignant neuroendocrine tumors: Secondary | ICD-10-CM

## 2023-11-14 DIAGNOSIS — C7B8 Other secondary neuroendocrine tumors: Secondary | ICD-10-CM

## 2023-11-14 LAB — CBC WITH DIFFERENTIAL (CANCER CENTER ONLY)
Abs Immature Granulocytes: 1.76 10*3/uL — ABNORMAL HIGH (ref 0.00–0.07)
Basophils Absolute: 0.2 10*3/uL — ABNORMAL HIGH (ref 0.0–0.1)
Basophils Relative: 1 %
Eosinophils Absolute: 0 10*3/uL (ref 0.0–0.5)
Eosinophils Relative: 0 %
HCT: 24 % — ABNORMAL LOW (ref 36.0–46.0)
Hemoglobin: 8 g/dL — ABNORMAL LOW (ref 12.0–15.0)
Immature Granulocytes: 9 %
Lymphocytes Relative: 8 %
Lymphs Abs: 1.5 10*3/uL (ref 0.7–4.0)
MCH: 30.8 pg (ref 26.0–34.0)
MCHC: 33.3 g/dL (ref 30.0–36.0)
MCV: 92.3 fL (ref 80.0–100.0)
Monocytes Absolute: 2.2 10*3/uL — ABNORMAL HIGH (ref 0.1–1.0)
Monocytes Relative: 11 %
Neutro Abs: 14.4 10*3/uL — ABNORMAL HIGH (ref 1.7–7.7)
Neutrophils Relative %: 71 %
Platelet Count: 592 10*3/uL — ABNORMAL HIGH (ref 150–400)
RBC: 2.6 MIL/uL — ABNORMAL LOW (ref 3.87–5.11)
RDW: 18.6 % — ABNORMAL HIGH (ref 11.5–15.5)
Smear Review: NORMAL
WBC Count: 20.1 10*3/uL — ABNORMAL HIGH (ref 4.0–10.5)
nRBC: 3 % — ABNORMAL HIGH (ref 0.0–0.2)

## 2023-11-14 LAB — CMP (CANCER CENTER ONLY)
ALT: 11 U/L (ref 0–44)
AST: 22 U/L (ref 15–41)
Albumin: 3.1 g/dL — ABNORMAL LOW (ref 3.5–5.0)
Alkaline Phosphatase: 113 U/L (ref 38–126)
Anion gap: 14 (ref 5–15)
BUN: 11 mg/dL (ref 6–20)
CO2: 26 mmol/L (ref 22–32)
Calcium: 8.3 mg/dL — ABNORMAL LOW (ref 8.9–10.3)
Chloride: 90 mmol/L — ABNORMAL LOW (ref 98–111)
Creatinine: 0.69 mg/dL (ref 0.44–1.00)
GFR, Estimated: >60 mL/min (ref >60)
Glucose, Bld: 119 mg/dL — ABNORMAL HIGH (ref 70–99)
Potassium: 3.1 mmol/L — ABNORMAL LOW (ref 3.5–5.1)
Sodium: 130 mmol/L — ABNORMAL LOW (ref 135–145)
Total Bilirubin: 1.1 mg/dL (ref 0.0–1.2)
Total Protein: 6.9 g/dL (ref 6.5–8.1)

## 2023-11-14 MED ORDER — PALONOSETRON HCL INJECTION 0.25 MG/5ML
0.2500 mg | Freq: Once | INTRAVENOUS | Status: AC
  Administered 2023-11-14: 0.25 mg via INTRAVENOUS
  Filled 2023-11-14: qty 5

## 2023-11-14 MED ORDER — SODIUM CHLORIDE 0.9 % IV SOLN
400.0000 mg | Freq: Once | INTRAVENOUS | Status: AC
  Administered 2023-11-14: 400 mg via INTRAVENOUS
  Filled 2023-11-14: qty 40

## 2023-11-14 MED ORDER — DEXAMETHASONE SODIUM PHOSPHATE 10 MG/ML IJ SOLN
10.0000 mg | Freq: Once | INTRAMUSCULAR | Status: AC
  Administered 2023-11-14: 10 mg via INTRAVENOUS
  Filled 2023-11-14: qty 1

## 2023-11-14 MED ORDER — SODIUM CHLORIDE 0.9 % IV SOLN: 600.0000 mg | Freq: Once | INTRAVENOUS | Status: DC

## 2023-11-14 MED ORDER — SODIUM CHLORIDE 0.9 % IV SOLN
100.0000 mg/m2 | Freq: Once | INTRAVENOUS | Status: AC
  Administered 2023-11-14: 224 mg via INTRAVENOUS
  Filled 2023-11-14: qty 11.2

## 2023-11-14 MED ORDER — POTASSIUM CHLORIDE 20 MEQ/100ML IV SOLN
20.0000 meq | Freq: Once | INTRAVENOUS | Status: AC
  Administered 2023-11-14: 20 meq via INTRAVENOUS

## 2023-11-14 MED ORDER — SODIUM CHLORIDE 0.9 % IV SOLN
150.0000 mg | Freq: Once | INTRAVENOUS | Status: AC
  Administered 2023-11-14: 150 mg via INTRAVENOUS
  Filled 2023-11-14: qty 150

## 2023-11-14 MED ORDER — SODIUM CHLORIDE 0.9 % IV SOLN
INTRAVENOUS | Status: DC
  Filled 2023-11-14: qty 250

## 2023-11-14 MED ORDER — HEPARIN SOD (PORK) LOCK FLUSH 100 UNIT/ML IV SOLN
500.0000 [IU] | Freq: Once | INTRAVENOUS | Status: AC | PRN
  Administered 2023-11-14: 500 [IU]
  Filled 2023-11-14: qty 5

## 2023-11-14 NOTE — Progress Notes (Signed)
 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  This was followed by a CT abdomen pelvis with contrast which showed widespread metastatic disease to abdominopelvic and thoracic nodes, omentum and lungs.  Abnormal appearance of the uterus and lower uterine segment and cervix favoring endometrial or cervical primary.  Patient was seen by Dr. Johnnette Litter and underwent endometrial and cervical biopsy as well as biopsy of the retroperitoneal lymph node.  Cervical biopsy was consistent with squamous mucosa and abundant necrotic debris highly suspicious but not definitively diagnostic for malignancy.  FNA of the retroperitoneal lymph node showed poorly differentiated carcinoma with neuroendocrine features.   Comment: The following immunohistochemistry was performed after review of the clinical history and morphology to further characterize the pathologic process. The results are as follows:   A1-14  PanCK                      positive  A1-15  P16                           Positive, patchy A1-16  P53                           Normal/wild-type  A1-17  PAX8                        Focal blush positive  A1-18  ER Dx Only              negative   A1-19  Bio Neg Ctrl              Appropriately reactive control slide  A1-22  CK7                          Positive, patchy A1-23  CK20                        negative  A1-24  GATA 3                    negative  A1-25  CDX-2                      Positive, patchy A1-26  TTF-1                        negative  A1-27  INSM-1                     Positive, patchy weak A1-28SynaptophysinPositive, patchy weak A1-31  BRG-1  Favor retained (weak) nuclear staining A1-32  BAF 47                     Retained nuclear staining A1-33  P63                           negative  A1-34  Claudin-4                  Positive, patchy    Smears and cell block show a poorly differentiated carcinoma. The immunophenotype is non-specific, but shows evidence of neuroendocrine differentiation.  Plan is for 46 cycles of carbo etoposide chemotherapy if tolerated.   Family history significant for stage IV colon cancer in her mother in her 57s.  Sister had breast cancer in her 1s.  Interval history-patient feels better after her recent hospitalization.  Fungal infection/intertrigo is getting better with fluconazole and she has 4 more days remaining.  She is able to eat a better.  Denies any abdominal pain  ECOG PS- 2 Pain scale- 0 Opioid associated constipation- no  Review of systems- Review of Systems  Constitutional:  Positive for malaise/fatigue. Negative for chills, fever and weight loss.  HENT:  Negative for congestion, ear discharge and nosebleeds.   Eyes:  Negative for blurred vision.  Respiratory:  Negative for cough, hemoptysis, sputum production, shortness of breath and wheezing.   Cardiovascular:  Negative for chest pain, palpitations, orthopnea and claudication.  Gastrointestinal:  Negative for abdominal pain, blood in stool, constipation, diarrhea, heartburn, melena, nausea and vomiting.  Genitourinary:  Negative for dysuria, flank pain, frequency, hematuria and urgency.   Musculoskeletal:  Negative for back pain, joint pain and myalgias.  Skin:  Negative for rash.  Neurological:  Negative for dizziness, tingling, focal weakness, seizures, weakness and headaches.  Endo/Heme/Allergies:  Does not bruise/bleed easily.  Psychiatric/Behavioral:  Negative for depression and suicidal ideas. The patient does not have insomnia.       No Known Allergies   Past Medical History:  Diagnosis Date   Cancer (HCC)    Coronary artery disease    GERD (gastroesophageal reflux disease)    Hypertension    Hypothyroidism    Myocardial infarction (HCC)    Thyroid disease      Past Surgical History:  Procedure Laterality Date   CARDIAC CATHETERIZATION     CESAREAN SECTION     CORONARY STENT INTERVENTION N/A 07/24/2019   Procedure: CORONARY STENT INTERVENTION;  Surgeon: Yvonne Kendall, MD;  Location: [facility] INVASIVE CV LAB;  Service: Cardiovascular;  Laterality: N/A;  RCA & CFX   IR IMAGING GUIDED PORT INSERTION  09/23/2023   LEFT HEART CATH AND CORONARY ANGIOGRAPHY N/A 07/24/2019   Procedure: LEFT HEART CATH AND CORONARY ANGIOGRAPHY;  Surgeon: Lamar Blinks, MD;  Location: [facility] INVASIVE CV LAB;  Service: Cardiovascular;  Laterality: N/A;   TONSILLECTOMY      Social History   Socioeconomic History   Marital status: Married    Spouse name: Ryane Konieczny   Number of children: 1   Years of education: Not on file   Highest education level: Not on file  Occupational History   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[employer])  Tobacco Use   Smoking status: Never   Smokeless tobacco: Never  Vaping Use   Vaping status: Never Used  Substance and Sexual Activity   Alcohol use: Not Currently    Comment: occ   Drug use: Never   Sexual activity: Not  Currently  Other Topics Concern   Not on file  Social History Narrative   Not on file   Social Drivers of Health   Financial Resource Strain: Low Risk  (07/23/2019)   Overall Financial Resource Strain (CARDIA)    Difficulty  of Paying Living Expenses: Not hard at all  Food Insecurity: No Food Insecurity (11/05/2023)   Hunger Vital Sign    Worried About Running Out of Food in the Last Year: Never true    Ran Out of Food in the Last Year: Never true  Transportation Needs: No Transportation Needs (11/05/2023)   PRAPARE - Administrator, Civil Service (Medical): No    Lack of Transportation (Non-Medical): No  Physical Activity: Unknown (07/23/2019)   Exercise Vital Sign    Days of Exercise per Week: 2 days    Minutes of Exercise per Session: Not on file  Stress: No Stress Concern Present (07/23/2019)   [employer] of Occupational Health - Occupational Stress Questionnaire    Feeling of Stress : Not at all  Social Connections: Moderately Integrated (10/11/2023)   Social Connection and Isolation Panel [NHANES]    Frequency of Communication with Friends and Family: More than three times a week    Frequency of Social Gatherings with Friends and Family: Twice a week    Attends Religious Services: 1 to 4 times per year    Active Member of [employer] or Organizations: No    Attends Banker Meetings: Never    Marital Status: Married  Catering manager Violence: Not At Risk (11/05/2023)   Humiliation, Afraid, Rape, and Kick questionnaire    Fear of Current or Ex-Partner: No    Emotionally Abused: No    Physically Abused: No    Sexually Abused: No    Family History  Problem Relation Age of Onset   Atrial fibrillation Mother        Passed January 2024.   Colon cancer Mother        Diagnosed 2007   Heart Problems Father    Heart attack Father    Heart disease Father      Current Outpatient Medications:    Cholecalciferol (VITAMIN D-1000 MAX ST) 25 MCG (1000 UT) tablet, Take 1,000 Units by mouth daily., Disp: , Rfl:    fluconazole (DIFLUCAN) 100 MG tablet, Take 1 tablet (100 mg total) by mouth daily for 7 days., Disp: 7 tablet, Rfl: 0   lidocaine-prilocaine (EMLA) cream, Apply to  affected area once, Disp: 30 g, Rfl: 3   Multiple Vitamin (MULTI-VITAMIN) tablet, Take 1 tablet by mouth daily., Disp: , Rfl:    nystatin cream (MYCOSTATIN), Apply topically 2 (two) times daily., Disp: 30 g, Rfl: 0   ondansetron (ZOFRAN) 8 MG tablet, Take 1 tablet (8 mg total) by mouth every 8 (eight) hours as needed for nausea, vomiting or refractory nausea / vomiting. Start on the third day after carboplatin., Disp: 30 tablet, Rfl: 1   prochlorperazine (COMPAZINE) 10 MG tablet, Take 1 tablet (10 mg total) by mouth every 6 (six) hours as needed for nausea or vomiting., Disp: 30 tablet, Rfl: 1   sodium chloride 1 g tablet, Take 1 tablet (1 g total) by mouth 2 (two) times daily with a meal., Disp: 60 tablet, Rfl: 1   thyroid (ARMOUR) 120 MG tablet, Take 120 mg by mouth daily before breakfast., Disp: , Rfl:    Vitamin D, Ergocalciferol, (DRISDOL) 1.25 MG (50000 UNIT) CAPS capsule, Take 50,000 Units by mouth once  a week., Disp: , Rfl:    metoprolol tartrate (LOPRESSOR) 25 MG tablet, Take 0.5 tablets (12.5 mg total) by mouth 2 (two) times daily., Disp: 15 tablet, Rfl: 0 No current facility-administered medications for this visit.  Facility-Administered Medications Ordered in Other Visits:    0.9 %  sodium chloride infusion, , Intravenous, Continuous, Creig Hines, MD, Last Rate: 10 mL/hr at 11/14/23 1403, New Bag at 11/14/23 1403   etoposide (VEPESID) 224 mg in sodium chloride 0.9 % 1,000 mL chemo infusion, 100 mg/m2 (Order-Specific), Intravenous, Once, Creig Hines, MD, Last Rate: 1,011 mL/hr at 11/14/23 1536, 224 mg at 11/14/23 1536  Physical exam:  Vitals:   11/14/23 1315  BP: (!) 142/81  Pulse: (!) 101  Resp: 16  Temp: 98 F (36.7 C)  TempSrc: Tympanic  SpO2: 95%  Weight: 238 lb (108 kg)  Height: 5\' 4"  (1.626 m)   Physical Exam Cardiovascular:     Rate and Rhythm: Normal rate and regular rhythm.     Heart sounds: Normal heart sounds.  Pulmonary:     Effort: Pulmonary effort is  normal.     Breath sounds: Normal breath sounds.  Abdominal:     General: Bowel sounds are normal.     Comments: Firm but nontender  Musculoskeletal:     Right lower leg: Edema present.     Left lower leg: Edema present.  Skin:    General: Skin is warm and dry.  Neurological:     Mental Status: She is alert and oriented to person, place, and time.         Latest Ref Rng & Units 11/14/2023    1:06 PM  CMP  Glucose 70 - 99 mg/dL 161   BUN 6 - 20 mg/dL 11   Creatinine 0.96 - 1.00 mg/dL 0.45   Sodium 409 - 811 mmol/L 130   Potassium 3.5 - 5.1 mmol/L 3.1   Chloride 98 - 111 mmol/L 90   CO2 22 - 32 mmol/L 26   Calcium 8.9 - 10.3 mg/dL 8.3   Total Protein 6.5 - 8.1 g/dL 6.9   Total Bilirubin 0.0 - 1.2 mg/dL 1.1   Alkaline Phos 38 - 126 U/L 113   AST 15 - 41 U/L 22   ALT 0 - 44 U/L 11       Latest Ref Rng & Units 11/14/2023    1:06 PM  CBC  WBC 4.0 - 10.5 K/uL 20.1   Hemoglobin 12.0 - 15.0 g/dL 8.0   Hematocrit 91.4 - 46.0 % 24.0   Platelets 150 - 400 K/uL 592     No images are attached to the encounter.  DG Chest Port 1 View Result Date: 11/04/2023 CLINICAL DATA:  Questionable sepsis - evaluate for abnormality. Tachycardia. Shortness of breath. EXAM: PORTABLE CHEST 1 VIEW COMPARISON:  10/10/2023. FINDINGS: Linear area of scarring/atelectasis noted overlying the left lower lung zone, laterally. There also probable left retrocardiac atelectatic changes. Bilateral lung fields are otherwise clear. No dense consolidation or lung collapse. Bilateral costophrenic angles are clear. Stable cardio-mediastinal silhouette. No acute osseous abnormalities. The soft tissues are within normal limits. Right-sided CT Port-A-Cath is seen with its tip overlying the cavoatrial junction region. IMPRESSION: No active disease. Electronically Signed   By: Jules Schick M.D.   On: 11/04/2023 17:33     Assessment and plan- Patient is a 58 y.o. 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  She is here  for on treatment assessment prior to cycle 3 of carbo etoposide chemotherapy  Given that patient had hospitalization both times after her chemotherapy and given the significant anemia requiring blood transfusions, I am reducing carboplatin dosed to AUC 4.  Patient received about 540 mg of carboplatin with second cycle and will get up to 400 mg with cycle 3.  Etoposide will be given at 100 mg/m on all 3 days.  Plan to repeat CT chest abdomen and pelvis with contrast in 10 days.  I will see her back in 3 weeks for cycle 4  Patient will be seen by NP Consuello Masse in 1 week with CBC with differential and hold tube for possible transfusion.  CBC and hold tube for possible transfusion also in 2 weeks.  Hypokalemia: We will give her 20 mill equivalents of IV potassium with her chemotherapy tomorrow.   Visit Diagnosis 1. 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 Antineoplastic chemotherapy induced anemia   3. Encounter for antineoplastic chemotherapy      Dr. Owens Shark, MD, MPH [facility] [phone] 11/14/2023 3:55 PM

## 2023-11-14 NOTE — Progress Notes (Signed)
 Per Dr. Smith Robert  Will reduce carboplatin dose to 400 mg. Due to patient not tolerating previous doses of 540 mg   Sharen Hones, PharmD, BCPS Clinical Pharmacist

## 2023-11-15 ENCOUNTER — Other Ambulatory Visit: Payer: Self-pay

## 2023-11-15 ENCOUNTER — Inpatient Hospital Stay

## 2023-11-15 VITALS — BP 168/87 | HR 95 | Temp 97.3°F | Resp 18

## 2023-11-15 DIAGNOSIS — Z5111 Encounter for antineoplastic chemotherapy: Secondary | ICD-10-CM | POA: Diagnosis not present

## 2023-11-15 DIAGNOSIS — E876 Hypokalemia: Secondary | ICD-10-CM

## 2023-11-15 DIAGNOSIS — C7A8 Other malignant neuroendocrine tumors: Secondary | ICD-10-CM

## 2023-11-15 MED ORDER — HEPARIN SOD (PORK) LOCK FLUSH 100 UNIT/ML IV SOLN
500.0000 [IU] | Freq: Once | INTRAVENOUS | Status: AC | PRN
  Administered 2023-11-15: 500 [IU]
  Filled 2023-11-15: qty 5

## 2023-11-15 MED ORDER — POTASSIUM CHLORIDE 20 MEQ/100ML IV SOLN
20.0000 meq | Freq: Once | INTRAVENOUS | Status: AC
  Administered 2023-11-15: 20 meq via INTRAVENOUS

## 2023-11-15 MED ORDER — DEXAMETHASONE SODIUM PHOSPHATE 10 MG/ML IJ SOLN
10.0000 mg | Freq: Once | INTRAMUSCULAR | Status: AC
  Administered 2023-11-15: 10 mg via INTRAVENOUS
  Filled 2023-11-15: qty 1

## 2023-11-15 MED ORDER — SODIUM CHLORIDE 0.9 % IV SOLN
100.0000 mg/m2 | Freq: Once | INTRAVENOUS | Status: AC
  Administered 2023-11-15: 224 mg via INTRAVENOUS
  Filled 2023-11-15: qty 11.2

## 2023-11-15 MED ORDER — SODIUM CHLORIDE 0.9 % IV SOLN
INTRAVENOUS | Status: DC
  Filled 2023-11-15: qty 250

## 2023-11-15 NOTE — Patient Instructions (Signed)
 [facility] - A DEPT OF [facility]  Discharge Instructions: Thank you for choosing [facility] to provide your oncology and hematology care.  If you have a lab appointment with the Cancer Center, please go directly to the Cancer Center and check in at the registration area.  Wear comfortable clothing and clothing appropriate for easy access to any Portacath or PICC line.   We strive to give you quality time with your provider. You may need to reschedule your appointment if you arrive late (15 or more minutes).  Arriving late affects you and other patients whose appointments are after yours.  Also, if you miss three or more appointments without notifying the office, you may be dismissed from the clinic at the provider's discretion.      For prescription refill requests, have your pharmacy contact our office and allow 72 hours for refills to be completed.    Today you received the following chemotherapy and/or immunotherapy agents ETOPOSIDE AND POTASSSIUM      To help prevent nausea and vomiting after your treatment, we encourage you to take your nausea medication as directed.  BELOW ARE SYMPTOMS THAT SHOULD BE REPORTED IMMEDIATELY: *FEVER GREATER THAN 100.4 F (38 C) OR HIGHER *CHILLS OR SWEATING *NAUSEA AND VOMITING THAT IS NOT CONTROLLED WITH YOUR NAUSEA MEDICATION *UNUSUAL SHORTNESS OF BREATH *UNUSUAL BRUISING OR BLEEDING *URINARY PROBLEMS (pain or burning when urinating, or frequent urination) *BOWEL PROBLEMS (unusual diarrhea, constipation, pain near the anus) TENDERNESS IN MOUTH AND THROAT WITH OR WITHOUT PRESENCE OF ULCERS (sore throat, sores in mouth, or a toothache) UNUSUAL RASH, SWELLING OR PAIN  UNUSUAL VAGINAL DISCHARGE OR ITCHING   Items with * indicate a potential emergency and should be followed up as soon as possible or go to the Emergency Department if any problems should occur.  Please show the CHEMOTHERAPY ALERT CARD or  IMMUNOTHERAPY ALERT CARD at check-in to the Emergency Department and triage nurse.  Should you have questions after your visit or need to cancel or reschedule your appointment, please contact [facility] - A DEPT OF [facility]  [phone] and follow the prompts.  Office hours are 8:00 a.m. to 4:30 p.m. Monday - Friday. Please note that voicemails left after 4:00 p.m. may not be returned until the following business day.  We are closed weekends and major holidays. You have access to a nurse at all times for urgent questions. Please call the main number to the clinic [phone] and follow the prompts.  For any non-urgent questions, you may also contact your provider using MyChart. We now offer e-Visits for anyone 80 and older to request care online for non-urgent symptoms. For details visit [email or web address].   Also download the MyChart app! Go to the app store, search "MyChart", open the app, select , and log in with your MyChart username and password.  Etoposide Injection What is this medication? ETOPOSIDE (e toe POE side) treats some types of cancer. It works by slowing down the growth of cancer cells. This medicine may be used for other purposes; ask your health care provider or pharmacist if you have questions. COMMON BRAND NAME(S): Etopophos, Toposar, VePesid What should I tell my care team before I take this medication? They need to know if you have any of these conditions: Infection Kidney disease Liver disease Low blood counts, such as low white cell, platelet, red cell counts An unusual or allergic reaction  to etoposide, other medications, foods, dyes, or preservatives If you or your partner are pregnant or trying to get pregnant Breastfeeding How should I use this medication? This medication is injected into a vein. It is given by your care team in a hospital or clinic setting. Talk to your care team about the use of this  medication in children. Special care may be needed. Overdosage: If you think you have taken too much of this medicine contact a poison control center or emergency room at once. NOTE: This medicine is only for you. Do not share this medicine with others. What if I miss a dose? Keep appointments for follow-up doses. It is important not to miss your dose. Call your care team if you are unable to keep an appointment. What may interact with this medication? Warfarin This list may not describe all possible interactions. Give your health care provider a list of all the medicines, herbs, non-prescription drugs, or dietary supplements you use. Also tell them if you smoke, drink alcohol, or use illegal drugs. Some items may interact with your medicine. What should I watch for while using this medication? Your condition will be monitored carefully while you are receiving this medication. This medication may make you feel generally unwell. This is not uncommon as chemotherapy can affect healthy cells as well as cancer cells. Report any side effects. Continue your course of treatment even though you feel ill unless your care team tells you to stop. This medication can cause serious side effects. To reduce the risk, your care team may give you other medications to take before receiving this one. Be sure to follow the directions from your care team. This medication may increase your risk of getting an infection. Call your care team for advice if you get a fever, chills, sore throat, or other symptoms of a cold or flu. Do not treat yourself. Try to avoid being around people who are sick. This medication may increase your risk to bruise or bleed. Call your care team if you notice any unusual bleeding. Talk to your care team about your risk of cancer. You may be more at risk for certain types of cancers if you take this medication. Talk to your care team if you may be pregnant. Serious birth defects can occur if you  take this medication during pregnancy and for 6 months after the last dose. You will need a negative pregnancy test before starting this medication. Contraception is recommended while taking this medication and for 6 months after the last dose. Your care team can help you find the option that works for you. If your partner can get pregnant, use a condom during sex while taking this medication and for 4 months after the last dose. Do not breastfeed while taking this medication. This medication may cause infertility. Talk to your care team if you are concerned about your fertility. What side effects may I notice from receiving this medication? Side effects that you should report to your care team as soon as possible: Allergic reactions--skin rash, itching, hives, swelling of the face, lips, tongue, or throat Infection--fever, chills, cough, sore throat, wounds that don't heal, pain or trouble when passing urine, general feeling of discomfort or being unwell Low red blood cell level--unusual weakness or fatigue, dizziness, headache, trouble breathing Unusual bruising or bleeding Side effects that usually do not require medical attention (report to your care team if they continue or are bothersome): Diarrhea Fatigue Hair loss Loss of appetite Nausea Vomiting This  list may not describe all possible side effects. Call your doctor for medical advice about side effects. You may report side effects to FDA at 1-800-FDA-1088. Where should I keep my medication? This medication is given in a hospital or clinic. It will not be stored at home. NOTE: This sheet is a summary. It may not cover all possible information. If you have questions about this medicine, talk to your doctor, pharmacist, or health care provider.  2024 Elsevier/Gold Standard (2021-12-31 00:00:00)

## 2023-11-16 ENCOUNTER — Inpatient Hospital Stay

## 2023-11-16 VITALS — BP 145/80 | HR 93 | Temp 96.8°F | Resp 18

## 2023-11-16 DIAGNOSIS — Z5111 Encounter for antineoplastic chemotherapy: Secondary | ICD-10-CM | POA: Diagnosis not present

## 2023-11-16 DIAGNOSIS — C7A8 Other malignant neuroendocrine tumors: Secondary | ICD-10-CM

## 2023-11-16 MED ORDER — HEPARIN SOD (PORK) LOCK FLUSH 100 UNIT/ML IV SOLN
500.0000 [IU] | Freq: Once | INTRAVENOUS | Status: AC | PRN
  Administered 2023-11-16: 500 [IU]
  Filled 2023-11-16: qty 5

## 2023-11-16 MED ORDER — SODIUM CHLORIDE 0.9 % IV SOLN
100.0000 mg/m2 | Freq: Once | INTRAVENOUS | Status: AC
  Administered 2023-11-16: 224 mg via INTRAVENOUS
  Filled 2023-11-16: qty 11.2

## 2023-11-16 MED ORDER — DEXAMETHASONE SODIUM PHOSPHATE 10 MG/ML IJ SOLN
10.0000 mg | Freq: Once | INTRAMUSCULAR | Status: AC
  Administered 2023-11-16: 10 mg via INTRAVENOUS

## 2023-11-16 MED ORDER — SODIUM CHLORIDE 0.9 % IV SOLN
INTRAVENOUS | Status: DC
  Filled 2023-11-16: qty 250

## 2023-11-16 NOTE — Patient Instructions (Signed)
 [facility] - A DEPT OF [facility]  Discharge Instructions: Thank you for choosing [facility] to provide your oncology and hematology care.  If you have a lab appointment with the Cancer Center, please go directly to the Cancer Center and check in at the registration area.  Wear comfortable clothing and clothing appropriate for easy access to any Portacath or PICC line.   We strive to give you quality time with your provider. You may need to reschedule your appointment if you arrive late (15 or more minutes).  Arriving late affects you and other patients whose appointments are after yours.  Also, if you miss three or more appointments without notifying the office, you may be dismissed from the clinic at the provider's discretion.      For prescription refill requests, have your pharmacy contact our office and allow 72 hours for refills to be completed.    Today you received the following chemotherapy and/or immunotherapy agents ETOPOSIDE      To help prevent nausea and vomiting after your treatment, we encourage you to take your nausea medication as directed.  BELOW ARE SYMPTOMS THAT SHOULD BE REPORTED IMMEDIATELY: *FEVER GREATER THAN 100.4 F (38 C) OR HIGHER *CHILLS OR SWEATING *NAUSEA AND VOMITING THAT IS NOT CONTROLLED WITH YOUR NAUSEA MEDICATION *UNUSUAL SHORTNESS OF BREATH *UNUSUAL BRUISING OR BLEEDING *URINARY PROBLEMS (pain or burning when urinating, or frequent urination) *BOWEL PROBLEMS (unusual diarrhea, constipation, pain near the anus) TENDERNESS IN MOUTH AND THROAT WITH OR WITHOUT PRESENCE OF ULCERS (sore throat, sores in mouth, or a toothache) UNUSUAL RASH, SWELLING OR PAIN  UNUSUAL VAGINAL DISCHARGE OR ITCHING   Items with * indicate a potential emergency and should be followed up as soon as possible or go to the Emergency Department if any problems should occur.  Please show the CHEMOTHERAPY ALERT CARD or IMMUNOTHERAPY  ALERT CARD at check-in to the Emergency Department and triage nurse.  Should you have questions after your visit or need to cancel or reschedule your appointment, please contact [facility] - A DEPT OF [facility]  [phone] and follow the prompts.  Office hours are 8:00 a.m. to 4:30 p.m. Monday - Friday. Please note that voicemails left after 4:00 p.m. may not be returned until the following business day.  We are closed weekends and major holidays. You have access to a nurse at all times for urgent questions. Please call the main number to the clinic [phone] and follow the prompts.  For any non-urgent questions, you may also contact your provider using MyChart. We now offer e-Visits for anyone 58 and older to request care online for non-urgent symptoms. For details visit [email or web address].   Also download the MyChart app! Go to the app store, search "MyChart", open the app, select [place], and log in with your MyChart username and password.  Etoposide Injection What is this medication? ETOPOSIDE (e toe POE side) treats some types of cancer. It works by slowing down the growth of cancer cells. This medicine may be used for other purposes; ask your health care provider or pharmacist if you have questions. COMMON BRAND NAME(S): Etopophos, Toposar, VePesid What should I tell my care team before I take this medication? They need to know if you have any of these conditions: Infection Kidney disease Liver disease Low blood counts, such as low white cell, platelet, red cell counts An unusual or allergic reaction to etoposide,  other medications, foods, dyes, or preservatives If you or your partner are pregnant or trying to get pregnant Breastfeeding How should I use this medication? This medication is injected into a vein. It is given by your care team in a hospital or clinic setting. Talk to your care team about the use of this medication in  children. Special care may be needed. Overdosage: If you think you have taken too much of this medicine contact a poison control center or emergency room at once. NOTE: This medicine is only for you. Do not share this medicine with others. What if I miss a dose? Keep appointments for follow-up doses. It is important not to miss your dose. Call your care team if you are unable to keep an appointment. What may interact with this medication? Warfarin This list may not describe all possible interactions. Give your health care provider a list of all the medicines, herbs, non-prescription drugs, or dietary supplements you use. Also tell them if you smoke, drink alcohol, or use illegal drugs. Some items may interact with your medicine. What should I watch for while using this medication? Your condition will be monitored carefully while you are receiving this medication. This medication may make you feel generally unwell. This is not uncommon as chemotherapy can affect healthy cells as well as cancer cells. Report any side effects. Continue your course of treatment even though you feel ill unless your care team tells you to stop. This medication can cause serious side effects. To reduce the risk, your care team may give you other medications to take before receiving this one. Be sure to follow the directions from your care team. This medication may increase your risk of getting an infection. Call your care team for advice if you get a fever, chills, sore throat, or other symptoms of a cold or flu. Do not treat yourself. Try to avoid being around people who are sick. This medication may increase your risk to bruise or bleed. Call your care team if you notice any unusual bleeding. Talk to your care team about your risk of cancer. You may be more at risk for certain types of cancers if you take this medication. Talk to your care team if you may be pregnant. Serious birth defects can occur if you take this  medication during pregnancy and for 6 months after the last dose. You will need a negative pregnancy test before starting this medication. Contraception is recommended while taking this medication and for 6 months after the last dose. Your care team can help you find the option that works for you. If your partner can get pregnant, use a condom during sex while taking this medication and for 4 months after the last dose. Do not breastfeed while taking this medication. This medication may cause infertility. Talk to your care team if you are concerned about your fertility. What side effects may I notice from receiving this medication? Side effects that you should report to your care team as soon as possible: Allergic reactions--skin rash, itching, hives, swelling of the face, lips, tongue, or throat Infection--fever, chills, cough, sore throat, wounds that don't heal, pain or trouble when passing urine, general feeling of discomfort or being unwell Low red blood cell level--unusual weakness or fatigue, dizziness, headache, trouble breathing Unusual bruising or bleeding Side effects that usually do not require medical attention (report to your care team if they continue or are bothersome): Diarrhea Fatigue Hair loss Loss of appetite Nausea Vomiting This list may  not describe all possible side effects. Call your doctor for medical advice about side effects. You may report side effects to FDA at 1-800-FDA-1088. Where should I keep my medication? This medication is given in a hospital or clinic. It will not be stored at home. NOTE: This sheet is a summary. It may not cover all possible information. If you have questions about this medicine, talk to your doctor, pharmacist, or health care provider.  2024 Elsevier/Gold Standard (2021-12-31 00:00:00)

## 2023-11-16 NOTE — Progress Notes (Signed)
 Nutrition Follow-up:  Patient with stage IV endometrium/cervix cancer with neuroendocrine features.  Patient receiving carboplatin and etoposide.    Met with patient during infusion.  Reports that her appetite is about the same.  Says that she is able to eat pureed chicken and rice soup.  Says that the texture of things makes her nauseated but more liquid foods she can tolerate.  Has been drinking 30 g protein shake.  Also making a shake with ensure, lactaid milk, honey, peanut butter, banana, yogurt.    Noted recent admission for sepsis, bacteremia, anemia.      Medications: reviewed  Labs: Na 130, K 3.1, glucose 119  Anthropometrics:   Weight 238 lb on 3/24 (swelling in feet and ankles)  257 lb on 3/5 with swelling 242 lb on 09/21/23   NUTRITION DIAGNOSIS: Inadequate oral intake continues   INTERVENTION:  Discussed adding unflavored protein powder to foods not high in protein (ie tomato soup, broth, etc) Continue shakes at least BID as well as homemade shake for added calories Continue to maximize foods that are working    MONITORING, EVALUATION, GOAL: weight trends, intake   NEXT VISIT: Wednesday, April 16 during infusion  Dajah Fischman B. Elease Hashimoto, CSO, [name] Registered Dietitian [phone]

## 2023-11-18 ENCOUNTER — Telehealth: Payer: Self-pay | Admitting: Obstetrics and Gynecology

## 2023-11-18 ENCOUNTER — Inpatient Hospital Stay

## 2023-11-18 DIAGNOSIS — C7A8 Other malignant neuroendocrine tumors: Secondary | ICD-10-CM

## 2023-11-18 DIAGNOSIS — Z5111 Encounter for antineoplastic chemotherapy: Secondary | ICD-10-CM | POA: Diagnosis not present

## 2023-11-18 MED ORDER — PEGFILGRASTIM-JMDB 6 MG/0.6ML ~~LOC~~ SOSY
6.0000 mg | PREFILLED_SYRINGE | Freq: Once | SUBCUTANEOUS | Status: AC
  Administered 2023-11-18: 6 mg via SUBCUTANEOUS
  Filled 2023-11-18: qty 0.6

## 2023-11-18 NOTE — Telephone Encounter (Signed)
 Pt called and wanted to know where the appt on 4/9 (gyn-onc) came from. I told her it was for GYN and she stated that she wants this appt canceled as she does need to see a GYN "with her condition" I told her I would send a message to [place] since she scheduled the appt

## 2023-11-21 ENCOUNTER — Inpatient Hospital Stay: Admitting: Nurse Practitioner

## 2023-11-21 ENCOUNTER — Other Ambulatory Visit: Payer: Self-pay | Admitting: *Deleted

## 2023-11-21 ENCOUNTER — Inpatient Hospital Stay (HOSPITAL_BASED_OUTPATIENT_CLINIC_OR_DEPARTMENT_OTHER): Admitting: Nurse Practitioner

## 2023-11-21 ENCOUNTER — Encounter: Payer: Self-pay | Admitting: Nurse Practitioner

## 2023-11-21 ENCOUNTER — Inpatient Hospital Stay

## 2023-11-21 VITALS — BP 167/77 | HR 94 | Temp 98.6°F | Resp 19 | Wt 221.6 lb

## 2023-11-21 DIAGNOSIS — N179 Acute kidney failure, unspecified: Secondary | ICD-10-CM

## 2023-11-21 DIAGNOSIS — D6481 Anemia due to antineoplastic chemotherapy: Secondary | ICD-10-CM | POA: Diagnosis not present

## 2023-11-21 DIAGNOSIS — C541 Malignant neoplasm of endometrium: Secondary | ICD-10-CM

## 2023-11-21 DIAGNOSIS — Z5111 Encounter for antineoplastic chemotherapy: Secondary | ICD-10-CM | POA: Diagnosis not present

## 2023-11-21 DIAGNOSIS — C7A8 Other malignant neuroendocrine tumors: Secondary | ICD-10-CM

## 2023-11-21 DIAGNOSIS — T451X5A Adverse effect of antineoplastic and immunosuppressive drugs, initial encounter: Secondary | ICD-10-CM

## 2023-11-21 DIAGNOSIS — D649 Anemia, unspecified: Secondary | ICD-10-CM

## 2023-11-21 LAB — BASIC METABOLIC PANEL - CANCER CENTER ONLY
Anion gap: 12 (ref 5–15)
BUN: 20 mg/dL (ref 6–20)
CO2: 26 mmol/L (ref 22–32)
Calcium: 9.2 mg/dL (ref 8.9–10.3)
Chloride: 92 mmol/L — ABNORMAL LOW (ref 98–111)
Creatinine: 0.59 mg/dL (ref 0.44–1.00)
GFR, Estimated: >60 mL/min (ref >60)
Glucose, Bld: 130 mg/dL — ABNORMAL HIGH (ref 70–99)
Potassium: 4.5 mmol/L (ref 3.5–5.1)
Sodium: 130 mmol/L — ABNORMAL LOW (ref 135–145)

## 2023-11-21 LAB — CBC (CANCER CENTER ONLY)
HCT: 20.6 % — ABNORMAL LOW (ref 36.0–46.0)
Hemoglobin: 6.9 g/dL — CL (ref 12.0–15.0)
MCH: 30.8 pg (ref 26.0–34.0)
MCHC: 33.5 g/dL (ref 30.0–36.0)
MCV: 92 fL (ref 80.0–100.0)
Platelet Count: 204 10*3/uL (ref 150–400)
RBC: 2.24 MIL/uL — ABNORMAL LOW (ref 3.87–5.11)
RDW: 17.6 % — ABNORMAL HIGH (ref 11.5–15.5)
WBC Count: 4.8 10*3/uL (ref 4.0–10.5)
nRBC: 0 % (ref 0.0–0.2)

## 2023-11-21 LAB — PREPARE RBC (CROSSMATCH)

## 2023-11-21 NOTE — Progress Notes (Signed)
 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  This was followed by a CT abdomen pelvis with contrast which showed widespread metastatic disease to abdominopelvic and thoracic nodes, omentum and lungs.  Abnormal appearance of the uterus and lower uterine segment and cervix favoring endometrial or cervical primary.  Patient was seen by Dr. Johnnette Litter and underwent endometrial and cervical biopsy as well as biopsy of the retroperitoneal lymph node.  Cervical biopsy was consistent with squamous mucosa and abundant necrotic debris highly suspicious but not definitively diagnostic for malignancy.  FNA of the retroperitoneal lymph node showed poorly differentiated carcinoma with neuroendocrine features.   Comment: The following immunohistochemistry was performed after review of the clinical history and morphology to further characterize the pathologic process. The results are as follows:   A1-14  PanCK                      positive  A1-15  P16                           Positive, patchy A1-16  P53                           Normal/wild-type  A1-17  PAX8                        Focal blush positive  A1-18  ER Dx Only              negative  A1-19  Bio Neg Ctrl               Appropriately reactive control slide  A1-22  CK7                          Positive, patchy A1-23  CK20                        negative  A1-24  GATA 3                    negative  A1-25  CDX-2                      Positive, patchy A1-26  TTF-1                        negative  A1-27  INSM-1                     Positive, patchy weak A1-28SynaptophysinPositive, patchy weak A1-31  BRG-1                      Favor retained (weak) nuclear staining A1-32  BAF 47                     Retained nuclear staining A1-33  P63                           negative  A1-34  Claudin-4                  Positive, patchy    Smears and cell block show a poorly differentiated carcinoma. The immunophenotype is non-specific, but shows evidence of neuroendocrine differentiation.  Plan is for 4-6 cycles of carbo etoposide chemotherapy if tolerated.   Family history significant for stage IV colon cancer in her mother in her 74s.  Sister had breast cancer in her 89s.  Interval history- Patient returns to clinic after receiving her 3rd cycle of chemotherapy for follow up. She denies fever or chills. Skin infection improved. She denies abdominal pain. Has been losing weight. Feels tired and short of breath with exertion.   ECOG PS- 2 Pain scale- 0 Opioid associated constipation- no  Review of systems- Review of Systems  Constitutional:  Positive for malaise/fatigue. Negative for chills, fever and weight loss.  HENT:  Negative for congestion, ear discharge and nosebleeds.   Eyes:  Negative for blurred vision.  Respiratory:  Negative for cough, hemoptysis, sputum production, shortness of breath and wheezing.   Cardiovascular:  Negative for chest pain, palpitations, orthopnea and claudication.  Gastrointestinal:  Negative for abdominal pain, blood in stool, constipation, diarrhea, heartburn, melena, nausea and vomiting.  Genitourinary:  Negative for dysuria, flank pain, frequency, hematuria and urgency.  Musculoskeletal:   Negative for back pain, joint pain and myalgias.  Skin:  Negative for rash.  Neurological:  Negative for dizziness, tingling, focal weakness, seizures, weakness and headaches.  Endo/Heme/Allergies:  Does not bruise/bleed easily.  Psychiatric/Behavioral:  Negative for depression and suicidal ideas. The patient does not have insomnia.     No Known Allergies   Past Medical History:  Diagnosis Date   Cancer (HCC)    Coronary artery disease    GERD (gastroesophageal reflux disease)    Hypertension    Hypothyroidism    Myocardial infarction (HCC)    Thyroid disease    Past Surgical History:  Procedure Laterality Date   CARDIAC CATHETERIZATION     CESAREAN SECTION     CORONARY STENT INTERVENTION N/A 07/24/2019   Procedure: CORONARY STENT INTERVENTION;  Surgeon: Yvonne Kendall, MD;  Location: [facility] INVASIVE CV LAB;  Service: Cardiovascular;  Laterality: N/A;  RCA & CFX   IR IMAGING GUIDED PORT INSERTION  09/23/2023   LEFT HEART CATH AND CORONARY ANGIOGRAPHY N/A 07/24/2019   Procedure: LEFT HEART CATH AND CORONARY ANGIOGRAPHY;  Surgeon: Lamar Blinks, MD;  Location: [facility] INVASIVE CV LAB;  Service: Cardiovascular;  Laterality: N/A;   TONSILLECTOMY      Social History   Socioeconomic History   Marital status: Married    Spouse name: Rhaelyn Giron   Number of children: 1   Years of education: Not on file   Highest education level: Not on file  Occupational History   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[employer])  Tobacco Use   Smoking status: Never   Smokeless tobacco: Never  Vaping Use   Vaping status: Never Used  Substance and Sexual Activity   Alcohol use: Not Currently    Comment: occ   Drug use: Never   Sexual activity: Not Currently  Other Topics Concern  Not on file  Social History Narrative   Not on file   Social Drivers of Health   Financial Resource Strain: Low Risk  (07/23/2019)   Overall Financial Resource Strain (CARDIA)    Difficulty of Paying Living Expenses:  Not hard at all  Food Insecurity: No Food Insecurity (11/05/2023)   Hunger Vital Sign    Worried About Running Out of Food in the Last Year: Never true    Ran Out of Food in the Last Year: Never true  Transportation Needs: No Transportation Needs (11/05/2023)   PRAPARE - Administrator, Civil Service (Medical): No    Lack of Transportation (Non-Medical): No  Physical Activity: Unknown (07/23/2019)   Exercise Vital Sign    Days of Exercise per Week: 2 days    Minutes of Exercise per Session: Not on file  Stress: No Stress Concern Present (07/23/2019)   [employer] of Occupational Health - Occupational Stress Questionnaire    Feeling of Stress : Not at all  Social Connections: Moderately Integrated (10/11/2023)   Social Connection and Isolation Panel [NHANES]    Frequency of Communication with Friends and Family: More than three times a week    Frequency of Social Gatherings with Friends and Family: Twice a week    Attends Religious Services: 1 to 4 times per year    Active Member of [employer] or Organizations: No    Attends Banker Meetings: Never    Marital Status: Married  Catering manager Violence: Not At Risk (11/05/2023)   Humiliation, Afraid, Rape, and Kick questionnaire    Fear of Current or Ex-Partner: No    Emotionally Abused: No    Physically Abused: No    Sexually Abused: No    Family History  Problem Relation Age of Onset   Atrial fibrillation Mother        Passed January 2024.   Colon cancer Mother        Diagnosed 2007   Heart Problems Father    Heart attack Father    Heart disease Father     Current Outpatient Medications:    Cholecalciferol (VITAMIN D-1000 MAX ST) 25 MCG (1000 UT) tablet, Take 1,000 Units by mouth daily., Disp: , Rfl:    lidocaine-prilocaine (EMLA) cream, Apply to affected area once, Disp: 30 g, Rfl: 3   metoprolol tartrate (LOPRESSOR) 25 MG tablet, Take 0.5 tablets (12.5 mg total) by mouth 2 (two) times daily., Disp:  15 tablet, Rfl: 0   Multiple Vitamin (MULTI-VITAMIN) tablet, Take 1 tablet by mouth daily., Disp: , Rfl:    nystatin cream (MYCOSTATIN), Apply topically 2 (two) times daily., Disp: 30 g, Rfl: 0   ondansetron (ZOFRAN) 8 MG tablet, Take 1 tablet (8 mg total) by mouth every 8 (eight) hours as needed for nausea, vomiting or refractory nausea / vomiting. Start on the third day after carboplatin., Disp: 30 tablet, Rfl: 1   prochlorperazine (COMPAZINE) 10 MG tablet, Take 1 tablet (10 mg total) by mouth every 6 (six) hours as needed for nausea or vomiting., Disp: 30 tablet, Rfl: 1   sodium chloride 1 g tablet, Take 1 tablet (1 g total) by mouth 2 (two) times daily with a meal., Disp: 60 tablet, Rfl: 1   thyroid (ARMOUR) 120 MG tablet, Take 120 mg by mouth daily before breakfast., Disp: , Rfl:    Vitamin D, Ergocalciferol, (DRISDOL) 1.25 MG (50000 UNIT) CAPS capsule, Take 50,000 Units by mouth once a week., Disp: , Rfl:  Physical exam:  Vitals:   11/21/23 1523  BP: (!) 167/77  Pulse: 94  Resp: 19  Temp: 98.6 F (37 C)  SpO2: 97%  Weight: 221 lb 9.6 oz (100.5 kg)   Physical Exam Vitals reviewed.  Constitutional:      Appearance: She is obese. She is not ill-appearing.     Comments: Wheelchair.  Pulmonary:     Effort: Pulmonary effort is normal.  Skin:    Coloration: Skin is pale.  Neurological:     Mental Status: She is alert and oriented to person, place, and time.         Latest Ref Rng & Units 11/21/2023    3:21 PM  CMP  Glucose 70 - 99 mg/dL 161   BUN 6 - 20 mg/dL 20   Creatinine 0.96 - 1.00 mg/dL 0.45   Sodium 409 - 811 mmol/L 130   Potassium 3.5 - 5.1 mmol/L 4.5   Chloride 98 - 111 mmol/L 92   CO2 22 - 32 mmol/L 26   Calcium 8.9 - 10.3 mg/dL 9.2       Latest Ref Rng & Units 11/21/2023    3:21 PM  CBC  WBC 4.0 - 10.5 K/uL 4.8   Hemoglobin 12.0 - 15.0 g/dL 6.9   Hematocrit 91.4 - 46.0 % 20.6   Platelets 150 - 400 K/uL 204     No images are attached to the  encounter.  DG Chest Port 1 View Result Date: 11/04/2023 CLINICAL DATA:  Questionable sepsis - evaluate for abnormality. Tachycardia. Shortness of breath. EXAM: PORTABLE CHEST 1 VIEW COMPARISON:  10/10/2023. FINDINGS: Linear area of scarring/atelectasis noted overlying the left lower lung zone, laterally. There also probable left retrocardiac atelectatic changes. Bilateral lung fields are otherwise clear. No dense consolidation or lung collapse. Bilateral costophrenic angles are clear. Stable cardio-mediastinal silhouette. No acute osseous abnormalities. The soft tissues are within normal limits. Right-sided CT Port-A-Cath is seen with its tip overlying the cavoatrial junction region. IMPRESSION: No active disease. Electronically Signed   By: Jules Schick M.D.   On: 11/04/2023 17:33     Assessment and plan- Patient is a 58 y.o. female   Chemotherapy induced anemia- hmg 6.9. Plan for transfusion of 1 unit irradiated pRBCs tomorrow.   Thrombocytopenia- resolved.   Neutropenia- resolved.   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 Cycle 1 complicated by c diff infection. Tecentriq held [employer] denial. Cycle 2 complicated by pancytopenia with hospitalization and IV antibiotics. Carboplatin dose reduced to AUC 4. Etoposide 100 mg/m2 on all 3 days. Plan is to repeat imaging upcoming.  AKI- improved.  C diff- s/p hospitalization. Completed oral vancomycin. Symptoms resolved.  Actinomyces- s/p ID consult. She has completed amoxicillin.  Hypokalemia- K 4.5. Monitor.   Hyponatremia- Na 130. Improved. On sodium chloride tablets.     Disposition:  1 unit prbcs tomorrow Follow up as scheduled- la   Visit Diagnosis No diagnosis found.    Dr. Owens Shark, MD, MPH [facility] at [facility] [phone] 11/21/2023 9:21 PM

## 2023-11-22 ENCOUNTER — Inpatient Hospital Stay

## 2023-11-22 ENCOUNTER — Inpatient Hospital Stay: Attending: Oncology

## 2023-11-22 VITALS — BP 168/77 | HR 86 | Temp 99.0°F | Resp 18

## 2023-11-22 DIAGNOSIS — Z5189 Encounter for other specified aftercare: Secondary | ICD-10-CM | POA: Insufficient documentation

## 2023-11-22 DIAGNOSIS — K5900 Constipation, unspecified: Secondary | ICD-10-CM | POA: Diagnosis not present

## 2023-11-22 DIAGNOSIS — E876 Hypokalemia: Secondary | ICD-10-CM | POA: Insufficient documentation

## 2023-11-22 DIAGNOSIS — Z79634 Long term (current) use of topoisomerase inhibitor: Secondary | ICD-10-CM | POA: Insufficient documentation

## 2023-11-22 DIAGNOSIS — Z8249 Family history of ischemic heart disease and other diseases of the circulatory system: Secondary | ICD-10-CM | POA: Insufficient documentation

## 2023-11-22 DIAGNOSIS — I10 Essential (primary) hypertension: Secondary | ICD-10-CM | POA: Diagnosis not present

## 2023-11-22 DIAGNOSIS — N136 Pyonephrosis: Secondary | ICD-10-CM | POA: Diagnosis not present

## 2023-11-22 DIAGNOSIS — I214 Non-ST elevation (NSTEMI) myocardial infarction: Secondary | ICD-10-CM | POA: Insufficient documentation

## 2023-11-22 DIAGNOSIS — D649 Anemia, unspecified: Secondary | ICD-10-CM

## 2023-11-22 DIAGNOSIS — Z6841 Body Mass Index (BMI) 40.0 and over, adult: Secondary | ICD-10-CM | POA: Insufficient documentation

## 2023-11-22 DIAGNOSIS — I252 Old myocardial infarction: Secondary | ICD-10-CM | POA: Insufficient documentation

## 2023-11-22 DIAGNOSIS — J439 Emphysema, unspecified: Secondary | ICD-10-CM | POA: Insufficient documentation

## 2023-11-22 DIAGNOSIS — C541 Malignant neoplasm of endometrium: Secondary | ICD-10-CM | POA: Diagnosis present

## 2023-11-22 DIAGNOSIS — D709 Neutropenia, unspecified: Secondary | ICD-10-CM | POA: Diagnosis not present

## 2023-11-22 DIAGNOSIS — J90 Pleural effusion, not elsewhere classified: Secondary | ICD-10-CM | POA: Diagnosis not present

## 2023-11-22 DIAGNOSIS — T451X5A Adverse effect of antineoplastic and immunosuppressive drugs, initial encounter: Secondary | ICD-10-CM | POA: Insufficient documentation

## 2023-11-22 DIAGNOSIS — I2510 Atherosclerotic heart disease of native coronary artery without angina pectoris: Secondary | ICD-10-CM | POA: Insufficient documentation

## 2023-11-22 DIAGNOSIS — C7A1 Malignant poorly differentiated neuroendocrine tumors: Secondary | ICD-10-CM | POA: Diagnosis present

## 2023-11-22 DIAGNOSIS — I700 Atherosclerosis of aorta: Secondary | ICD-10-CM | POA: Diagnosis not present

## 2023-11-22 DIAGNOSIS — Z79630 Long term (current) use of alkylating agent: Secondary | ICD-10-CM | POA: Insufficient documentation

## 2023-11-22 DIAGNOSIS — Z5111 Encounter for antineoplastic chemotherapy: Secondary | ICD-10-CM | POA: Diagnosis present

## 2023-11-22 DIAGNOSIS — C7B8 Other secondary neuroendocrine tumors: Secondary | ICD-10-CM | POA: Insufficient documentation

## 2023-11-22 DIAGNOSIS — D6481 Anemia due to antineoplastic chemotherapy: Secondary | ICD-10-CM | POA: Diagnosis not present

## 2023-11-22 DIAGNOSIS — Z9089 Acquired absence of other organs: Secondary | ICD-10-CM | POA: Insufficient documentation

## 2023-11-22 DIAGNOSIS — R188 Other ascites: Secondary | ICD-10-CM | POA: Diagnosis not present

## 2023-11-22 DIAGNOSIS — Z803 Family history of malignant neoplasm of breast: Secondary | ICD-10-CM | POA: Insufficient documentation

## 2023-11-22 DIAGNOSIS — E039 Hypothyroidism, unspecified: Secondary | ICD-10-CM | POA: Diagnosis not present

## 2023-11-22 DIAGNOSIS — R590 Localized enlarged lymph nodes: Secondary | ICD-10-CM | POA: Diagnosis not present

## 2023-11-22 DIAGNOSIS — Z8542 Personal history of malignant neoplasm of other parts of uterus: Secondary | ICD-10-CM | POA: Insufficient documentation

## 2023-11-22 DIAGNOSIS — Z800 Family history of malignant neoplasm of digestive organs: Secondary | ICD-10-CM | POA: Insufficient documentation

## 2023-11-22 DIAGNOSIS — Z79899 Other long term (current) drug therapy: Secondary | ICD-10-CM | POA: Insufficient documentation

## 2023-11-22 MED ORDER — DIPHENHYDRAMINE HCL 25 MG PO CAPS
25.0000 mg | ORAL_CAPSULE | Freq: Once | ORAL | Status: AC
  Administered 2023-11-22: 25 mg via ORAL
  Filled 2023-11-22: qty 1

## 2023-11-22 MED ORDER — SODIUM CHLORIDE 0.9% IV SOLUTION
250.0000 mL | INTRAVENOUS | Status: DC
  Administered 2023-11-22: 100 mL via INTRAVENOUS
  Filled 2023-11-22: qty 250

## 2023-11-22 MED ORDER — ACETAMINOPHEN 325 MG PO TABS
650.0000 mg | ORAL_TABLET | Freq: Once | ORAL | Status: AC
  Administered 2023-11-22: 650 mg via ORAL
  Filled 2023-11-22: qty 2

## 2023-11-22 MED ORDER — HEPARIN SOD (PORK) LOCK FLUSH 100 UNIT/ML IV SOLN
500.0000 [IU] | Freq: Once | INTRAVENOUS | Status: AC
Start: 2023-11-22 — End: 2023-11-22
  Administered 2023-11-22: 500 [IU] via INTRAVENOUS
  Filled 2023-11-22: qty 5

## 2023-11-23 ENCOUNTER — Ambulatory Visit

## 2023-11-23 LAB — BPAM RBC
Blood Product Expiration Date: 202504162359
ISSUE DATE / TIME: 202504011344
Unit Type and Rh: 202504162359
Unit Type and Rh: 9500

## 2023-11-23 LAB — TYPE AND SCREEN
ABO/RH(D): A POS
Antibody Screen: NEGATIVE
Unit division: 0

## 2023-11-25 ENCOUNTER — Ambulatory Visit
Admission: RE | Admit: 2023-11-25 | Discharge: 2023-11-25 | Disposition: A | Discharge status: Home / Self Care | Source: Ambulatory Visit | Attending: Oncology | Admitting: Oncology

## 2023-11-25 DIAGNOSIS — C7A8 Other malignant neuroendocrine tumors: Secondary | ICD-10-CM | POA: Insufficient documentation

## 2023-11-25 DIAGNOSIS — C7B8 Other secondary neuroendocrine tumors: Secondary | ICD-10-CM | POA: Diagnosis present

## 2023-11-25 MED ORDER — IOHEXOL 300 MG/ML  SOLN
100.0000 mL | Freq: Once | INTRAMUSCULAR | Status: AC | PRN
  Administered 2023-11-25: 100 mL via INTRAVENOUS

## 2023-11-25 MED ORDER — IOHEXOL 300 MG/ML  SOLN: 75.0000 mL | Freq: Once | INTRAMUSCULAR | Status: DC | PRN

## 2023-11-30 ENCOUNTER — Inpatient Hospital Stay

## 2023-11-30 ENCOUNTER — Inpatient Hospital Stay: Admitting: Obstetrics and Gynecology

## 2023-11-30 VITALS — BP 140/79 | HR 100 | Temp 96.0°F | Resp 16

## 2023-11-30 DIAGNOSIS — C541 Malignant neoplasm of endometrium: Secondary | ICD-10-CM

## 2023-11-30 DIAGNOSIS — Z5111 Encounter for antineoplastic chemotherapy: Secondary | ICD-10-CM | POA: Diagnosis not present

## 2023-11-30 LAB — CBC WITH DIFFERENTIAL/PLATELET
Abs Immature Granulocytes: 2.52 10*3/uL — ABNORMAL HIGH (ref 0.00–0.07)
Basophils Absolute: 0.1 10*3/uL (ref 0.0–0.1)
Basophils Relative: 0 %
Eosinophils Absolute: 0.1 10*3/uL (ref 0.0–0.5)
Eosinophils Relative: 1 %
HCT: 24.2 % — ABNORMAL LOW (ref 36.0–46.0)
Hemoglobin: 8.1 g/dL — ABNORMAL LOW (ref 12.0–15.0)
Immature Granulocytes: 12 %
Lymphocytes Relative: 9 %
Lymphs Abs: 2 10*3/uL (ref 0.7–4.0)
MCH: 31.4 pg (ref 26.0–34.0)
MCHC: 33.5 g/dL (ref 30.0–36.0)
MCV: 93.8 fL (ref 80.0–100.0)
Monocytes Absolute: 3.1 10*3/uL — ABNORMAL HIGH (ref 0.1–1.0)
Monocytes Relative: 14 %
Neutro Abs: 13.8 10*3/uL — ABNORMAL HIGH (ref 1.7–7.7)
Neutrophils Relative %: 64 %
Platelets: 290 10*3/uL (ref 150–400)
RBC: 2.58 MIL/uL — ABNORMAL LOW (ref 3.87–5.11)
RDW: 19.8 % — ABNORMAL HIGH (ref 11.5–15.5)
Smear Review: ADEQUATE
WBC: 21.6 10*3/uL — ABNORMAL HIGH (ref 4.0–10.5)
nRBC: 2.2 % — ABNORMAL HIGH (ref 0.0–0.2)

## 2023-11-30 LAB — SAMPLE TO BLOOD BANK

## 2023-11-30 NOTE — Progress Notes (Signed)
 Gynecologic Oncology Consult Visit   Referring Provider: Carlynn Purl, Dr Smith Robert  Chief Concern: Neuroendocrine cancer of uterus or cervix, advanced stage  Subjective:  Tonya [name] is a 58 y.o. with a past medical history significant for NSTEMI, HTN, T2DM, BMI 47, hypothyroidism who is seen in consultation at the request of Carlynn Purl, MD for uterine cancer with extensive nodal, peritoneal and lung metastasis.  She has completed 3 cycles of carbo/VP16 with Dr Smith Robert. Last cycle with dose reduction due to hospitalization after first two cycles for C diff and neutropenic fever and anemia. She reports that the vaginal bleeding stopped and pelvic pain has resolved. She has some fatigue and weakness with treatment. CT scan today shows interval improvement with shrinkage of all disease.   CT scan 11/25/23 FINDINGS: CT CHEST FINDINGS   Cardiovascular: Right IJ Port-A-Cath terminates in the right atrium. Atherosclerotic calcification of the aorta with age advanced involvement of all 3 coronary arteries. Heart is at the upper limits of normal in size to mildly enlarged. No pericardial effusion. Enlarged pulmonic trunk.   Mediastinum/Nodes: No pathologically enlarged mediastinal, hilar or axillary lymph nodes. Specifically, periesophageal lymph nodes have decreased in size, now measuring up to 8 mm (2/25), previously 2.1 cm. Retrocrural lymph nodes have decreased in size as well, measuring up to 9 mm (2/45), previously 1.5 cm.   Lungs/Pleura: Small to moderate left pleural effusion with atelectasis in the lingula and left lower lobe. No suspicious pulmonary nodules. Airway is unremarkable.   Musculoskeletal: Degenerative changes in the spine. No worrisome lytic or sclerotic lesions.   CT ABDOMEN PELVIS FINDINGS   Hepatobiliary: Liver and gallbladder are unremarkable. No biliary ductal dilatation.   Pancreas: Negative.   Spleen: Negative.   Adrenals/Urinary Tract: Adrenal  glands and kidneys are unremarkable. Ureters are decompressed. Bladder is relatively low in volume.   Stomach/Bowel: Stomach, small bowel, appendix and colon are unremarkable.   Vascular/Lymphatic: Periportal, gastrohepatic ligament and abdominopelvic retroperitoneal adenopathy has improved somewhat in the interval. Index left periaortic lymph node measures 12 mm (2/69), previously 1.8 cm. Index gastrohepatic ligament lymph node measures 10 mm, previously 1.8 cm.   Reproductive: Heterogeneous uterus.  No adnexal mass.   Other: Moderate ascites. Omental nodularity has improved somewhat in the interval, now measuring up to 8 mm (2/66), previously 13 mm. Presacral edema.   Musculoskeletal: Degenerative changes in the spine. L1-L5 compression fractures, unchanged. No worrisome lytic or sclerotic lesions.   IMPRESSION: 1. Interval response to therapy as evidenced by decrease in size of thoracic, abdominal and pelvic lymph adenopathy as well as improved omental metastatic disease. 2. Moderate ascites. 3. Age advanced three-vessel coronary artery calcification. 4.  Aortic atherosclerosis (ICD10-I70.0). 5. Enlarged pulmonic trunk, indicative of pulmonary arterial hypertension.  Gyn Oncology history Patient states she was initially was on ozempic and transitioned to [facility] during which she developed worsening constipation and cramping abdominal pain prompting imaging. CT 09/17/23 demonstrated uterine mass with impingement on the distal ureters and metastasis to the lungs, peritoneal cavity and retroperitoneal lymph nodes. Occasional vaginal spotting. Last pap smear was over 10 years ago, had an abnormal pap smear in her early 66s, had cryotherapy. States she had not had abnormal pap smears since.   CT ABDOMEN AND PELVIS WITH CONTRAST 08/29/23 FINDINGS:  Lower chest: Multiple tiny bilateral pulmonary nodules. Example left  lower lobe at 4 mm on 16/4.   3 mm on the right lower lobe on  20/4.   Retrocrural adenopathy at 1.0 cm on  19/2. Prominent but not  pathologically sized lower mediastinal nodes are incompletely  imaged. Mild cardiomegaly with multivessel coronary artery  calcification.   Hepatobiliary: Mild caudate and lateral segment left liver lobe  enlargement. Normal gallbladder, without biliary ductal dilatation.   Pancreas: Normal, without mass or ductal dilatation.   Spleen: Normal in size, without focal abnormality.   Adrenals/Urinary Tract: Normal adrenal glands. Mild bilateral  hydronephrosis and proximal hydroureter. The ureters are somewhat  challenging to follow, but no distal hydroureter is seen.   The bladder is mildly thick walled with pericystic edema.   Stomach/Bowel: Normal stomach, without wall thickening. Scattered  colonic diverticula. Normal terminal ileum and appendix. Normal  small bowel.   Vascular/Lymphatic: Advanced aortic and branch vessel  atherosclerosis. Extensive abdominal retroperitoneal adenopathy.  Retrocaval index node measures 1.5 cm at 34/2.   Gastrohepatic ligament node measures 1.5 cm in 25/2.   Pelvic adenopathy with a left external iliac 1.3 cm node on 67/2.   Reproductive: Central uterine enlargement at 1.8 cm on sagittal  image 82/6. Suggestion of soft tissue fullness within the lower  uterine segment and cervix including on 69/2. Periuterine  interstitial thickening.No adnexal mass.   Other: No abdominal ascites or free intraperitoneal air. Omental  nodularity including at 9 mm on 31/2. Peritoneal thickening  including of the left lateral conal fascia on 37/2.   Musculoskeletal: No acute osseous abnormality.   IMPRESSION:  1. Widespread metastatic disease, including to abdominopelvic and  lower thoracic nodes, omentum, and lungs as detailed above.  2. Abnormal appearance of the uterus for age. Favor endometrial or  cervical sites of primary.  3. Mild bilateral hydronephrosis and proximal hydroureter.  Given  absence of distal hydroureter, favor involvement by uterine or  cervical primary.  4. Bladder wall thickening and pericystic edema for which cystitis  cannot be excluded.  5. No bowel obstruction or other acute complication  6. Hepatic morphology suspicious for mild cirrhosis.  7. Aortic atherosclerosis (ICD10-I70.0), coronary artery  atherosclerosis and emphysema (ICD10-J43.9).   Seen by Gyn Oncology 09/17/23 at [facility]. Narrow vaginal canal tapering at the apex with induration anteriorly concerning for malignancy.  Cervical biopsy was consistent with squamous mucosa and abundant necrotic debris highly suspicious but not definitively diagnostic for malignancy.   09/13/23 Biopsy of retroperitoneal lymph node Poorly differentiated carcinoma with neuroendocrine features.  A1-14  PanCK                      positive  A1-15  P16                           Positive, patchy A1-16  P53                           Normal/wild-type  A1-17  PAX8                        Focal blush positive  A1-18  ER Dx Only              negative  A1-19  Bio Neg Ctrl              Appropriately reactive control slide  A1-22  CK7                          Positive, patchy A1-23  CK20  negative  A1-24  GATA 3                    negative  A1-25  CDX-2                      Positive, patchy A1-26  TTF-1                        negative  A1-27  INSM-1                     Positive, patchy weak A1-28SynaptophysinPositive, patchy weak A1-31  BRG-1                      Favor retained (weak) nuclear staining A1-32  BAF 47                     Retained nuclear staining A1-33  P63                           negative  A1-34  Claudin-4                  Positive, patchy     MMR IHC intact, MSS  Decision made to treat her with carboplatin/etoposide systemic therapy in view of extensive metastatic disease with pelvic, para-aortic, and thoracic lymphadenopathy, lung and peritoneal involvement, and mild  hydronephrosis consistent with stage IV disease.  11/14/23 Dr Smith Robert Given that patient had hospitalization both times after her chemotherapy due to C diff and febrile neutropenia and given the significant anemia requiring blood transfusions, I am reducing carboplatin dosed to AUC 4.  Patient received about 540 mg of carboplatin with second cycle and will get up to 400 mg with cycle 3.  Etoposide will be given at 100 mg/m on all 3 days.     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) 09/21/2023   Endometrial carcinoma (HCC) 09/21/2023   Malignant neoplasm of uterus (HCC) 09/01/2023   B12 deficiency 05/25/2023   Vitamin D deficiency 05/25/2023   Coronary artery disease involving native coronary artery of native heart with angina pectoris (HCC) 07/24/2022   Type 2 diabetes mellitus with peripheral angiopathy (HCC) 05/18/2022   Ischemic cardiomyopathy 08/09/2019   Essential hypertension 08/09/2019   Hyperlipidemia associated with type 2 diabetes  mellitus (HCC) 08/09/2019   Morbid obesity (HCC) 08/09/2019   Diabetes mellitus (HCC) 08/09/2019  NSTEMI (non-ST elevated myocardial infarction) (HCC) 07/23/2019   Hypothyroidism (acquired) 07/23/2019    Past Medical History: Past Medical History:  Diagnosis Date   Cancer (HCC)    Coronary artery disease    GERD (gastroesophageal reflux disease)    Hypertension    Hypothyroidism    Myocardial infarction (HCC)    Thyroid disease     Past Surgical History: Past Surgical History:  Procedure Laterality Date   CARDIAC CATHETERIZATION     CESAREAN SECTION     CORONARY STENT INTERVENTION N/A 07/24/2019   Procedure: CORONARY STENT INTERVENTION;  Surgeon: Yvonne Kendall, MD;  Location: [facility] INVASIVE CV LAB;  Service: Cardiovascular;  Laterality: N/A;  RCA & CFX   IR IMAGING GUIDED PORT INSERTION  09/23/2023   LEFT HEART CATH AND CORONARY ANGIOGRAPHY N/A 07/24/2019   Procedure: LEFT HEART CATH AND CORONARY ANGIOGRAPHY;  Surgeon: Lamar Blinks, MD;  Location: [facility] INVASIVE CV LAB;  Service: Cardiovascular;  Laterality: N/A;   TONSILLECTOMY      OB History:  OB History  No obstetric history on file.    Family History: Family History  Problem Relation Age of Onset   Atrial fibrillation Mother        Passed January 2024.   Colon cancer Mother        Diagnosed 2007   Heart Problems Father    Heart attack Father    Heart disease Father     Social History: Social History   Socioeconomic History   Marital status: Married    Spouse name: Gurbani Figge   Number of children: 1   Years of education: Not on file   Highest education level: Not on file  Occupational History   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[employer]  Tobacco Use   Smoking status: Never   Smokeless tobacco: Never  Vaping Use   Vaping status: Never Used  Substance and Sexual Activity   Alcohol use: Not Currently    Comment: occ   Drug use: Never   Sexual activity: Not Currently  Other Topics Concern   Not  on file  Social History Narrative   Not on file   Social Drivers of Health   Financial Resource Strain: Low Risk  (07/23/2019)   Overall Financial Resource Strain (CARDIA)    Difficulty of Paying Living Expenses: Not hard at all  Food Insecurity: No Food Insecurity (11/05/2023)   Hunger Vital Sign    Worried About Running Out of Food in the Last Year: Never true    Ran Out of Food in the Last Year: Never true  Transportation Needs: No Transportation Needs (11/05/2023)   PRAPARE - Administrator, Civil Service (Medical): No    Lack of Transportation (Non-Medical): No  Physical Activity: Unknown (07/23/2019)   Exercise Vital Sign    Days of Exercise per Week: 2 days    Minutes of Exercise per Session: Not on file  Stress: No Stress Concern Present (07/23/2019)   [employer] of Occupational Health - Occupational Stress Questionnaire    Feeling of Stress : Not at all  Social Connections: Moderately Integrated (10/11/2023)   Social Connection and Isolation Panel [NHANES]    Frequency of Communication with Friends and Family: More than three times a week    Frequency of Social Gatherings with Friends and Family: Twice a week    Attends Religious Services: 1 to 4 times per year    Active Member of [employer] or Organizations: No    Attends [employer]  or Organization Meetings: Never    Marital Status: Married  Catering manager Violence: Not At Risk (11/05/2023)   Humiliation, Afraid, Rape, and Kick questionnaire    Fear of Current or Ex-Partner: No    Emotionally Abused: No    Physically Abused: No    Sexually Abused: No    Allergies: No Known Allergies  Current Medications: Current Outpatient Medications  Medication Sig Dispense Refill   Cholecalciferol (VITAMIN D-1000 MAX ST) 25 MCG (1000 UT) tablet Take 1,000 Units by mouth daily.     lidocaine-prilocaine (EMLA) cream Apply to affected area once 30 g 3   Multiple Vitamin (MULTI-VITAMIN) tablet Take 1 tablet by mouth  daily.     nystatin cream (MYCOSTATIN) Apply topically 2 (two) times daily. 30 g 0   ondansetron (ZOFRAN) 8 MG tablet Take 1 tablet (8 mg total) by mouth every 8 (eight) hours as needed for nausea, vomiting or refractory nausea / vomiting. Start on the third day after carboplatin. 30 tablet 1   prochlorperazine (COMPAZINE) 10 MG tablet Take 1 tablet (10 mg total) by mouth every 6 (six) hours as needed for nausea or vomiting. 30 tablet 1   sodium chloride 1 g tablet Take 1 tablet (1 g total) by mouth 2 (two) times daily with a meal. 60 tablet 1   thyroid (ARMOUR) 120 MG tablet Take 120 mg by mouth daily before breakfast.     Vitamin D, Ergocalciferol, (DRISDOL) 1.25 MG (50000 UNIT) CAPS capsule Take 50,000 Units by mouth once a week.     metoprolol tartrate (LOPRESSOR) 25 MG tablet Take 0.5 tablets (12.5 mg total) by mouth 2 (two) times daily. 15 tablet 0   No current facility-administered medications for this visit.    Review of Systems General: negative for, fevers, chills, changes in sleep, changes in weight or appetite Skin: negative for changes in color, texture, moles or lesions Eyes: negative for, changes in vision, pain, diplopia HEENT: negative for, change in hearing, pain, discharge, tinnitus, vertigo, voice changes, sore throat, neck masses Pulmonary: negative for, dyspnea, orthopnea, productive cough Cardiac: negative for, palpitations, syncope, pain, discomfort, pressure Gastrointestinal: negative for, dysphagia, nausea, vomiting, jaundice, pain, constipation, diarrhea, hematemesis, hematochezia Genitourinary/Sexual: negative for, dysuria, discharge, hesitancy, nocturia, retention, stones, infections, STD's, incontinence Musculoskeletal: negative for, pain, stiffness, swelling, range of motion limitation Hematology: negative for, easy bruising, bleeding Neurologic/Psych: negative for, headaches, seizures, paralysis, weakness, tremor, change in gait, change in sensation, mood  swings, depression, anxiety, change in memory  Objective:  Physical Examination:  BP (!) 140/79 (BP Location: Left Arm, Patient Position: Sitting, Cuff Size: Normal)   Pulse 100   Temp (!) 96 F (35.6 C) (Tympanic)   Resp 16   LMP 07/22/2016   SpO2 98%    ECOG Performance Status: 2 - Symptomatic, <50% confined to bed  General appearance: alert, cooperative, and appears stated age HEENT:extra ocular movement intact, neck supple with midline trachea, and thyroid without masses Lymph node survey: non-palpable, axillary, inguinal, supraclavicular Cardiovascular: regular rate and rhythm Respiratory: normal air entry, lungs clear to auscultation and no rales, rhonchi or wheezing Abdomen: protuberant, no hernias, and well healed incision Back: inspection of back is normal Extremities: bilateral mild edema Skin exam - normal coloration and turgor, no rashes, no suspicious skin lesions noted. Neurological exam reveals alert, oriented, normal speech, no focal findings or movement disorder noted.  Pelvic: exam chaperoned by nurse;  Vulva: normal appearing vulva with no masses, tenderness or lesions; Vagina: narrow and tapers at top,  cervix not palpable; Adnexa: no masses; Uterus: not palpable; Cervix: not seen; Rectal: deferred  Lab Review Labs on site today: Lab Results  Component Value Date   WBC 4.8 11/21/2023   HGB 6.9 (LL) 11/21/2023   HCT 20.6 (L) 11/21/2023   MCV 92.0 11/21/2023   PLT 204 11/21/2023     Chemistry      Component Value Date/Time   NA 130 (L) 11/21/2023 1521   K 4.5 11/21/2023 1521   CL 92 (L) 11/21/2023 1521   CO2 26 11/21/2023 1521   BUN 20 11/21/2023 1521   CREATININE 0.59 11/21/2023 1521      Component Value Date/Time   CALCIUM 9.2 11/21/2023 1521   ALKPHOS 113 11/14/2023 1306   AST 22 11/14/2023 1306   ALT 11 11/14/2023 1306   BILITOT 1.1 11/14/2023 1306        Assessment:  Tonya [name] is a 58 y.o. 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  Decision made to treat her with carboplatin/etoposide systemic therapy in view of extensive metastatic stage IV disease.  Dr. Smith Robert considered adding Pembrolizumab but not approved by insurance.  The patient had hospitalization both times after her chemotherapy due to C diff and febrile neutropenia and given the significant anemia requiring blood transfusions carboplatin reduced to AUC 4.  Etoposide given at 100 mg/m on all 3 days.  Also receiving gCSF for neutrophil support.   MMR IHC intact, MSS  Anemia requiring blood transfusion, last on 11/22/23.    CT scan this week shows significant improvement in all areas of metastatic disease. Pelvic pain and spotting have stopped.   Medical co-morbidities complicating care: NSTEMI, HTN, T2DM, BMI 47, hypothyroidism Plan:   Problem List Items Addressed This Visit       Genitourinary   Endometrial carcinoma (HCC) - Primary    We discussed improvement in disease burden after three cycles of chemotherapy, but that there is still persistent metastatic disease.  She is tolerating chemotherapy but with significant fatigue and weakness and requiring blood transfusions and gCSF.    Discussed with Dr Smith Robert and we will plan for an additional three cycles of chemotherapy, since she is responding.   Suggested return to clinic in 3 months with repeat CT scan and to consider possibility of hysterectomy and BSO if she has a complete or near complete response of metastatic disease.   The patient's diagnosis, an outline of the further diagnostic and laboratory studies which will be required, the recommendation, and alternatives were discussed.  All questions were answered to the patient's satisfaction.  Leida Lauth, MD  CC:  Danella Penton, MD [address] [facility] [place],  [place] [address] [phone]

## 2023-12-02 MED FILL — Fosaprepitant Dimeglumine For IV Infusion 150 MG (Base Eq): INTRAVENOUS | Qty: 5 | Status: AC

## 2023-12-05 ENCOUNTER — Encounter: Payer: Self-pay | Admitting: Nurse Practitioner

## 2023-12-05 ENCOUNTER — Other Ambulatory Visit: Payer: Self-pay | Admitting: Nurse Practitioner

## 2023-12-05 ENCOUNTER — Inpatient Hospital Stay

## 2023-12-05 ENCOUNTER — Other Ambulatory Visit: Payer: Self-pay | Admitting: Oncology

## 2023-12-05 ENCOUNTER — Inpatient Hospital Stay (HOSPITAL_BASED_OUTPATIENT_CLINIC_OR_DEPARTMENT_OTHER): Admitting: Nurse Practitioner

## 2023-12-05 VITALS — BP 132/78 | HR 88

## 2023-12-05 VITALS — BP 140/90 | HR 98 | Temp 96.9°F | Resp 16 | Wt 204.0 lb

## 2023-12-05 DIAGNOSIS — C7A8 Other malignant neuroendocrine tumors: Secondary | ICD-10-CM

## 2023-12-05 DIAGNOSIS — Z5111 Encounter for antineoplastic chemotherapy: Secondary | ICD-10-CM | POA: Diagnosis not present

## 2023-12-05 DIAGNOSIS — E876 Hypokalemia: Secondary | ICD-10-CM | POA: Diagnosis not present

## 2023-12-05 DIAGNOSIS — T451X5A Adverse effect of antineoplastic and immunosuppressive drugs, initial encounter: Secondary | ICD-10-CM

## 2023-12-05 DIAGNOSIS — D6481 Anemia due to antineoplastic chemotherapy: Secondary | ICD-10-CM

## 2023-12-05 DIAGNOSIS — C7B8 Other secondary neuroendocrine tumors: Secondary | ICD-10-CM

## 2023-12-05 LAB — CBC WITH DIFFERENTIAL (CANCER CENTER ONLY)
Abs Immature Granulocytes: 0.25 10*3/uL — ABNORMAL HIGH (ref 0.00–0.07)
Basophils Absolute: 0.2 10*3/uL — ABNORMAL HIGH (ref 0.0–0.1)
Basophils Relative: 2 %
Eosinophils Absolute: 0.1 10*3/uL (ref 0.0–0.5)
Eosinophils Relative: 1 %
HCT: 25.6 % — ABNORMAL LOW (ref 36.0–46.0)
Hemoglobin: 8.4 g/dL — ABNORMAL LOW (ref 12.0–15.0)
Immature Granulocytes: 2 %
Lymphocytes Relative: 12 %
Lymphs Abs: 1.5 10*3/uL (ref 0.7–4.0)
MCH: 31.8 pg (ref 26.0–34.0)
MCHC: 32.8 g/dL (ref 30.0–36.0)
MCV: 97 fL (ref 80.0–100.0)
Monocytes Absolute: 1.9 10*3/uL — ABNORMAL HIGH (ref 0.1–1.0)
Monocytes Relative: 16 %
Neutro Abs: 8.1 10*3/uL — ABNORMAL HIGH (ref 1.7–7.7)
Neutrophils Relative %: 67 %
Platelet Count: 649 10*3/uL — ABNORMAL HIGH (ref 150–400)
RBC: 2.64 MIL/uL — ABNORMAL LOW (ref 3.87–5.11)
RDW: 21.9 % — ABNORMAL HIGH (ref 11.5–15.5)
WBC Count: 12 10*3/uL — ABNORMAL HIGH (ref 4.0–10.5)
nRBC: 1.3 % — ABNORMAL HIGH (ref 0.0–0.2)

## 2023-12-05 LAB — CMP (CANCER CENTER ONLY)
ALT: 13 U/L (ref 0–44)
AST: 22 U/L (ref 15–41)
Albumin: 4.3 g/dL (ref 3.5–5.0)
Alkaline Phosphatase: 80 U/L (ref 38–126)
Anion gap: 15 (ref 5–15)
BUN: 9 mg/dL (ref 6–20)
CO2: 27 mmol/L (ref 22–32)
Calcium: 9.4 mg/dL (ref 8.9–10.3)
Chloride: 88 mmol/L — ABNORMAL LOW (ref 98–111)
Creatinine: 0.65 mg/dL (ref 0.44–1.00)
GFR, Estimated: >60 mL/min (ref >60)
Glucose, Bld: 117 mg/dL — ABNORMAL HIGH (ref 70–99)
Potassium: 2.8 mmol/L — ABNORMAL LOW (ref 3.5–5.1)
Sodium: 130 mmol/L — ABNORMAL LOW (ref 135–145)
Total Bilirubin: 1.4 mg/dL — ABNORMAL HIGH (ref 0.0–1.2)
Total Protein: 7.8 g/dL (ref 6.5–8.1)

## 2023-12-05 MED ORDER — SODIUM CHLORIDE 0.9 % IV SOLN: 600.0000 mg | Freq: Once | INTRAVENOUS | Status: DC

## 2023-12-05 MED ORDER — SODIUM CHLORIDE 0.9 % IV SOLN
150.0000 mg | Freq: Once | INTRAVENOUS | Status: AC
  Administered 2023-12-05: 150 mg via INTRAVENOUS
  Filled 2023-12-05: qty 150

## 2023-12-05 MED ORDER — SODIUM CHLORIDE 0.9 % IV SOLN
542.4000 mg | Freq: Once | INTRAVENOUS | Status: AC
  Administered 2023-12-05: 540 mg via INTRAVENOUS
  Filled 2023-12-05: qty 54

## 2023-12-05 MED ORDER — PALONOSETRON HCL INJECTION 0.25 MG/5ML
0.2500 mg | Freq: Once | INTRAVENOUS | Status: AC
  Administered 2023-12-05: 0.25 mg via INTRAVENOUS
  Filled 2023-12-05: qty 5

## 2023-12-05 MED ORDER — SODIUM CHLORIDE 0.9 % IV SOLN
INTRAVENOUS | Status: DC
Start: 2023-12-05 — End: 2023-12-05
  Filled 2023-12-05: qty 250

## 2023-12-05 MED ORDER — CYANOCOBALAMIN 1000 MCG/ML IJ SOLN
1000.0000 ug | Freq: Once | INTRAMUSCULAR | Status: DC
  Filled 2023-12-05: qty 1

## 2023-12-05 MED ORDER — POTASSIUM CHLORIDE CRYS ER 10 MEQ PO TBCR: 20.0000 meq | EXTENDED_RELEASE_TABLET | Freq: Two times a day (BID) | ORAL | 1 refills | Status: DC

## 2023-12-05 MED ORDER — POTASSIUM CHLORIDE 20 MEQ/100ML IV SOLN
20.0000 meq | Freq: Once | INTRAVENOUS | Status: AC
  Administered 2023-12-05: 20 meq via INTRAVENOUS

## 2023-12-05 MED ORDER — HEPARIN SOD (PORK) LOCK FLUSH 100 UNIT/ML IV SOLN
500.0000 [IU] | Freq: Once | INTRAVENOUS | Status: AC | PRN
  Administered 2023-12-05: 500 [IU]
  Filled 2023-12-05: qty 5

## 2023-12-05 MED ORDER — DEXAMETHASONE SODIUM PHOSPHATE 10 MG/ML IJ SOLN
10.0000 mg | Freq: Once | INTRAMUSCULAR | Status: AC
  Administered 2023-12-05: 10 mg via INTRAVENOUS
  Filled 2023-12-05: qty 1

## 2023-12-05 MED ORDER — ETOPOSIDE CHEMO INJECTION 1 GM/50ML
100.0000 mg/m2 | Freq: Once | INTRAVENOUS | Status: AC
  Administered 2023-12-05: 224 mg via INTRAVENOUS
  Filled 2023-12-05: qty 11.2

## 2023-12-05 NOTE — Progress Notes (Signed)
 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  This was followed by a CT abdomen pelvis with contrast which showed widespread metastatic disease to abdominopelvic and thoracic nodes, omentum and lungs.  Abnormal appearance of the uterus and lower uterine segment and cervix favoring endometrial or cervical primary.  Patient was seen by Dr. Johnnette Litter and underwent endometrial and cervical biopsy as well as biopsy of the retroperitoneal lymph node.  Cervical biopsy was consistent with squamous mucosa and abundant necrotic debris highly suspicious but not definitively diagnostic for malignancy.  FNA of the retroperitoneal lymph node showed poorly differentiated carcinoma with neuroendocrine features.   Comment: The following immunohistochemistry was performed after review of the clinical history and morphology to further characterize the pathologic process. The results are as follows:   A1-14  PanCK                      positive  A1-15  P16                            Positive, patchy A1-16  P53                            Normal/wild-type  A1-17  PAX8                         Focal blush positive  A1-18  ER Dx Only               negative   A1-19  Bio Neg Ctrl               Appropriately reactive control slide  A1-22  CK7                           Positive, patchy A1-23  CK20                         negative  A1-24  GATA 3                    negative  A1-25  CDX-2                       Positive, patchy A1-26  TTF-1                         negative  A1-27  INSM-1                      Positive, patchy weak A1-28  Synaptophysin  Positive, patchy weak A1-31  BRG-1  Favor retained (weak) nuclear staining A1-32  BAF 47                      Retained nuclear staining A1-33  P63                            negative  A1-34  Claudin-4                   Positive, patchy    Smears and cell block show a poorly differentiated carcinoma. The immunophenotype is non-specific, but shows evidence of neuroendocrine differentiation.  Plan is for 46 cycles of carbo etoposide chemotherapy if tolerated.   Family history significant for stage IV colon cancer in her mother in her 46s. Sister had breast cancer in her 60s.  Interval history- Patient is 58 year old female with above history of endometrial malignancy with extensive nodal, peritoneal, and lung metastases, who returns to clinic for consideration of cycle 4 of carbo-etoposide chemotherapy. In interim, she has seen Dr. Marella Shams with gyn onc. CT scan showed improvement in areas of metastatic disease. Pelvic pain and spotting have also stopped. Weight is down but appetite is stable. Denies pain.   ECOG PS- 2 Pain scale- 0 Opioid associated constipation- no  Review of systems- Review of Systems  Constitutional:  Positive for malaise/fatigue. Negative for chills, fever and weight loss.  HENT:  Negative for congestion, ear discharge and nosebleeds.   Eyes:  Negative for blurred vision.  Respiratory:  Negative for cough, hemoptysis, sputum production, shortness of breath and wheezing.   Cardiovascular:  Negative for chest pain, palpitations, orthopnea and claudication.   Gastrointestinal:  Negative for abdominal pain, blood in stool, constipation, diarrhea, heartburn, melena, nausea and vomiting.  Genitourinary:  Negative for dysuria, flank pain, frequency, hematuria and urgency.  Musculoskeletal:  Negative for back pain, joint pain and myalgias.  Skin:  Negative for rash.  Neurological:  Negative for dizziness, tingling, focal weakness, seizures, weakness and headaches.  Endo/Heme/Allergies:  Does not bruise/bleed easily.  Psychiatric/Behavioral:  Negative for depression and suicidal ideas. The patient does not have insomnia.     No Known Allergies  Past Medical History:  Diagnosis Date   Cancer (HCC)    Coronary artery disease    GERD (gastroesophageal reflux disease)    Hypertension    Hypothyroidism    Myocardial infarction (HCC)    Thyroid disease    Past Surgical History:  Procedure Laterality Date   CARDIAC CATHETERIZATION     CESAREAN SECTION     CORONARY STENT INTERVENTION N/A 07/24/2019   Procedure: CORONARY STENT INTERVENTION;  Surgeon: Sammy Crisp, MD;  Location: [facility] INVASIVE CV LAB;  Service: Cardiovascular;  Laterality: N/A;  RCA & CFX   IR IMAGING GUIDED PORT INSERTION  09/23/2023   LEFT HEART CATH AND CORONARY ANGIOGRAPHY N/A 07/24/2019   Procedure: LEFT HEART CATH AND CORONARY ANGIOGRAPHY;  Surgeon: Michelle Aid, MD;  Location: [facility] INVASIVE CV LAB;  Service: Cardiovascular;  Laterality: N/A;   TONSILLECTOMY     Social History   Socioeconomic History   Marital status: Married    Spouse name: Dorinda Stehr   Number of children: 1   Years of education: Not on file   Highest education level: Not on file  Occupational History   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[employer])  Tobacco Use   Smoking status: Never   Smokeless tobacco: Never  Vaping Use   Vaping  status: Never Used  Substance and Sexual Activity   Alcohol use: Not Currently    Comment: occ   Drug use: Never   Sexual activity: Not Currently  Other Topics  Concern   Not on file  Social History Narrative   Not on file   Social Drivers of Health   Financial Resource Strain: Low Risk  (07/23/2019)   Overall Financial Resource Strain (CARDIA)    Difficulty of Paying Living Expenses: Not hard at all  Food Insecurity: No Food Insecurity (11/05/2023)   Hunger Vital Sign    Worried About Running Out of Food in the Last Year: Never true    Ran Out of Food in the Last Year: Never true  Transportation Needs: No Transportation Needs (11/05/2023)   PRAPARE - Administrator, Civil Service (Medical): No    Lack of Transportation (Non-Medical): No  Physical Activity: Unknown (07/23/2019)   Exercise Vital Sign    Days of Exercise per Week: 2 days    Minutes of Exercise per Session: Not on file  Stress: No Stress Concern Present (07/23/2019)   [employer] of Occupational Health - Occupational Stress Questionnaire    Feeling of Stress : Not at all  Social Connections: Moderately Integrated (10/11/2023)   Social Connection and Isolation Panel [NHANES]    Frequency of Communication with Friends and Family: More than three times a week    Frequency of Social Gatherings with Friends and Family: Twice a week    Attends Religious Services: 1 to 4 times per year    Active Member of [employer] or Organizations: No    Attends Banker Meetings: Never    Marital Status: Married  Catering manager Violence: Not At Risk (11/05/2023)   Humiliation, Afraid, Rape, and Kick questionnaire    Fear of Current or Ex-Partner: No    Emotionally Abused: No    Physically Abused: No    Sexually Abused: No   Family History  Problem Relation Age of Onset   Atrial fibrillation Mother        Passed January 2024.   Colon cancer Mother        Diagnosed 2007   Heart Problems Father    Heart attack Father    Heart disease Father    Current Outpatient Medications:    Cholecalciferol (VITAMIN D-1000 MAX ST) 25 MCG (1000 UT) tablet, Take 1,000 Units by  mouth daily., Disp: , Rfl:    lidocaine-prilocaine (EMLA) cream, Apply to affected area once, Disp: 30 g, Rfl: 3   Multiple Vitamin (MULTI-VITAMIN) tablet, Take 1 tablet by mouth daily., Disp: , Rfl:    nystatin cream (MYCOSTATIN), Apply topically 2 (two) times daily., Disp: 30 g, Rfl: 0   ondansetron (ZOFRAN) 8 MG tablet, Take 1 tablet (8 mg total) by mouth every 8 (eight) hours as needed for nausea, vomiting or refractory nausea / vomiting. Start on the third day after carboplatin., Disp: 30 tablet, Rfl: 1   potassium chloride (KLOR-CON M) 10 MEQ tablet, Take 2 tablets (20 mEq total) by mouth 2 (two) times daily., Disp: 120 tablet, Rfl: 1   prochlorperazine (COMPAZINE) 10 MG tablet, Take 1 tablet (10 mg total) by mouth every 6 (six) hours as needed for nausea or vomiting., Disp: 30 tablet, Rfl: 1   sodium chloride 1 g tablet, Take 1 tablet (1 g total) by mouth 2 (two) times daily with a meal., Disp: 60 tablet, Rfl: 1   thyroid (ARMOUR) 120 MG tablet, Take 120  mg by mouth daily before breakfast., Disp: , Rfl:    Vitamin D, Ergocalciferol, (DRISDOL) 1.25 MG (50000 UNIT) CAPS capsule, Take 50,000 Units by mouth once a week., Disp: , Rfl:    metoprolol tartrate (LOPRESSOR) 25 MG tablet, Take 0.5 tablets (12.5 mg total) by mouth 2 (two) times daily., Disp: 15 tablet, Rfl: 0 No current facility-administered medications for this visit.  Facility-Administered Medications Ordered in Other Visits:    0.9 %  sodium chloride infusion, , Intravenous, Continuous, Avonne Boettcher, MD, Last Rate: 10 mL/hr at 12/05/23 1224, New Bag at 12/05/23 1224   CARBOplatin (PARAPLATIN) 600 mg in sodium chloride 0.9 % 250 mL chemo infusion, 600 mg, Intravenous, Once, Rao, Archana C, MD   etoposide (VEPESID) 224 mg in sodium chloride 0.9 % 1,000 mL chemo infusion, 100 mg/m2 (Order-Specific), Intravenous, Once, Avonne Boettcher, MD   fosaprepitant (EMEND) 150 mg in sodium chloride 0.9 % 145 mL IVPB, 150 mg, Intravenous, Once, Avonne Boettcher, MD, Last Rate: 450 mL/hr at 12/05/23 1225, 150 mg at 12/05/23 1225   potassium chloride 20 mEq in 100 mL IVPB, 20 mEq, Intravenous, Once, Nelda Balsam, NP  Physical exam:  Vitals:   12/05/23 1143  BP: (!) 140/90  Pulse: 98  Resp: 16  Temp: (!) 96.9 F (36.1 C)  TempSrc: Tympanic  SpO2: 99%  Weight: 204 lb (92.5 kg)   Physical Exam Cardiovascular:     Rate and Rhythm: Normal rate and regular rhythm.     Heart sounds: Normal heart sounds.  Pulmonary:     Effort: Pulmonary effort is normal.     Breath sounds: Normal breath sounds.  Abdominal:     General: Bowel sounds are normal.     Comments: Firm but nontender  Musculoskeletal:     Right lower leg: Edema present.     Left lower leg: Edema present.  Skin:    General: Skin is warm and dry.  Neurological:     Mental Status: She is alert and oriented to person, place, and time.        Latest Ref Rng & Units 12/05/2023   11:14 AM  CMP  Glucose 70 - 99 mg/dL 324   BUN 6 - 20 mg/dL 9   Creatinine 4.01 - 0.27 mg/dL 2.53   Sodium 664 - 403 mmol/L 130   Potassium 3.5 - 5.1 mmol/L 2.8   Chloride 98 - 111 mmol/L 88   CO2 22 - 32 mmol/L 27   Calcium 8.9 - 10.3 mg/dL 9.4   Total Protein 6.5 - 8.1 g/dL 7.8   Total Bilirubin 0.0 - 1.2 mg/dL 1.4   Alkaline Phos 38 - 126 U/L 80   AST 15 - 41 U/L 22   ALT 0 - 44 U/L 13       Latest Ref Rng & Units 12/05/2023   11:14 AM  CBC  WBC 4.0 - 10.5 K/uL 12.0   Hemoglobin 12.0 - 15.0 g/dL 8.4   Hematocrit 47.4 - 46.0 % 25.6   Platelets 150 - 400 K/uL 649    CT CHEST ABDOMEN PELVIS W CONTRAST Result Date: 11/30/2023 CLINICAL DATA:  Metastatic neuroendocrine tumor. Stage IV poorly differentiated carcinoma of the endometrium. * Tracking Code: BO * EXAM: CT CHEST, ABDOMEN, AND PELVIS WITH CONTRAST TECHNIQUE: Multidetector CT imaging of the chest, abdomen and pelvis was performed following the standard protocol during bolus administration of intravenous contrast. RADIATION DOSE  REDUCTION: This exam was performed according to the departmental  dose-optimization program which includes automated exposure control, adjustment of the mA and/or kV according to patient size and/or use of iterative reconstruction technique. CONTRAST:  OMNIPAQUE IOHEXOL 300 MG/ML  SOLN COMPARISON:  CT abdomen pelvis 10/10/2023 and CT chest 10/10/2023. FINDINGS: CT CHEST FINDINGS Cardiovascular: Right IJ Port-A-Cath terminates in the right atrium. Atherosclerotic calcification of the aorta with age advanced involvement of all 3 coronary arteries. Heart is at the upper limits of normal in size to mildly enlarged. No pericardial effusion. Enlarged pulmonic trunk. Mediastinum/Nodes: No pathologically enlarged mediastinal, hilar or axillary lymph nodes. Specifically, periesophageal lymph nodes have decreased in size, now measuring up to 8 mm (2/25), previously 2.1 cm. Retrocrural lymph nodes have decreased in size as well, measuring up to 9 mm (2/45), previously 1.5 cm. Lungs/Pleura: Small to moderate left pleural effusion with atelectasis in the lingula and left lower lobe. No suspicious pulmonary nodules. Airway is unremarkable. Musculoskeletal: Degenerative changes in the spine. No worrisome lytic or sclerotic lesions. CT ABDOMEN PELVIS FINDINGS Hepatobiliary: Liver and gallbladder are unremarkable. No biliary ductal dilatation. Pancreas: Negative. Spleen: Negative. Adrenals/Urinary Tract: Adrenal glands and kidneys are unremarkable. Ureters are decompressed. Bladder is relatively low in volume. Stomach/Bowel: Stomach, small bowel, appendix and colon are unremarkable. Vascular/Lymphatic: Periportal, gastrohepatic ligament and abdominopelvic retroperitoneal adenopathy has improved somewhat in the interval. Index left periaortic lymph node measures 12 mm (2/69), previously 1.8 cm. Index gastrohepatic ligament lymph node measures 10 mm, previously 1.8 cm. Reproductive: Heterogeneous uterus.  No adnexal mass. Other:  Moderate ascites. Omental nodularity has improved somewhat in the interval, now measuring up to 8 mm (2/66), previously 13 mm. Presacral edema. Musculoskeletal: Degenerative changes in the spine. L1-L5 compression fractures, unchanged. No worrisome lytic or sclerotic lesions. IMPRESSION: 1. Interval response to therapy as evidenced by decrease in size of thoracic, abdominal and pelvic lymph adenopathy as well as improved omental metastatic disease. 2. Moderate ascites. 3. Age advanced three-vessel coronary artery calcification. 4.  Aortic atherosclerosis (ICD10-I70.0). 5. Enlarged pulmonic trunk, indicative of pulmonary arterial hypertension. Electronically Signed   By: Shearon Denis M.D.   On: 11/30/2023 15:40   Assessment and plan- Patient is a 58 y.o. 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 Interval CT was reviewed independently and by Gyn-Onc/dr Berchuck with evidence of response. Recommendation for continuation for chemotherapy with another 3 cycles. She has experienced hospitalizations after cycle 1 and cycle 2 as well as significant anemia requiring transfusions. Carboplatin was dose reduced to AUC 4 for cycle 3 with improved tolerance. Etoposide given at 100 mg/m2 on all 3 days. Plan to image following cycle 6 with re-evaluation with gyn onc for possible debulking.  Encounter for chemotherapy- Labs today reviewed and acceptable for treatment. Reviewed plan for dose reduced carboplatin on D1 and etoposide 100 mg/m2 on D1, D2, D3.  GCSF- she will continue to receive GCSF support on D5 for prevention of febrile neutropenias. Reviewed use of claritin and tylenol for arthralgias and myalgias.  Chemotherapy induced anemia- she has required transfusions due to ongoing anemia. Today, Hmg 8.4. Plan to check hemoglobin weekly with possible transfusion on D2. If she becomes increasingly  symptomatic, we can check counts sooner.  Hypokalemia- K 2.8 today. Plan for 20 meq of IV potassium with chemotherapy today. Plan to start oral potassium 20 meq twice daily. Due to tablet size, will send prescription for 10 meq tablets for easier swallowing.   Disposition:  Chemo,  IV potassium, b12 today Cancel blood Thursday 1 week- D1 port/lab (cbc, hold tube), D2 +/- transfusion 2 weeks- D1 port/lab (cbc, hold tube), D2 +/- transfusion F/u per IS- la   Visit Diagnosis 1. 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 Encounter for antineoplastic chemotherapy   3. Antineoplastic chemotherapy induced anemia   4. Hypokalemia     Kenney Peacemaker, DNP, AGNP-C, [facility] [phone] (clinic) 12/05/2023

## 2023-12-05 NOTE — Progress Notes (Signed)
 Patient wt decreased to 92.5kg.  checked with dr Randy Buttery and calculated new dose to be 540mg  based on new wt and crcl.

## 2023-12-06 ENCOUNTER — Inpatient Hospital Stay

## 2023-12-06 ENCOUNTER — Ambulatory Visit

## 2023-12-06 ENCOUNTER — Other Ambulatory Visit: Payer: Self-pay

## 2023-12-06 VITALS — BP 149/81 | HR 89 | Temp 96.1°F | Resp 16

## 2023-12-06 DIAGNOSIS — C7A8 Other malignant neuroendocrine tumors: Secondary | ICD-10-CM

## 2023-12-06 DIAGNOSIS — Z5111 Encounter for antineoplastic chemotherapy: Secondary | ICD-10-CM | POA: Diagnosis not present

## 2023-12-06 MED ORDER — HEPARIN SOD (PORK) LOCK FLUSH 100 UNIT/ML IV SOLN
500.0000 [IU] | Freq: Once | INTRAVENOUS | Status: AC
  Administered 2023-12-06: 500 [IU] via INTRAVENOUS
  Filled 2023-12-06: qty 5

## 2023-12-06 MED ORDER — SODIUM CHLORIDE 0.9 % IV SOLN
100.0000 mg/m2 | Freq: Once | INTRAVENOUS | Status: AC
  Administered 2023-12-06: 224 mg via INTRAVENOUS
  Filled 2023-12-06: qty 11.2

## 2023-12-06 MED ORDER — DEXAMETHASONE SODIUM PHOSPHATE 10 MG/ML IJ SOLN
10.0000 mg | Freq: Once | INTRAMUSCULAR | Status: AC
  Administered 2023-12-06: 10 mg via INTRAVENOUS
  Filled 2023-12-06: qty 1

## 2023-12-06 MED ORDER — SODIUM CHLORIDE 0.9 % IV SOLN
INTRAVENOUS | Status: DC
  Filled 2023-12-06: qty 250

## 2023-12-06 NOTE — Patient Instructions (Signed)
 [facility] - A DEPT OF [facility]  Discharge Instructions: Thank you for choosing [facility] to provide your oncology and hematology care.  If you have a lab appointment with the Cancer Center, please go directly to the Cancer Center and check in at the registration area.  Wear comfortable clothing and clothing appropriate for easy access to any Portacath or PICC line.   We strive to give you quality time with your provider. You may need to reschedule your appointment if you arrive late (15 or more minutes).  Arriving late affects you and other patients whose appointments are after yours.  Also, if you miss three or more appointments without notifying the office, you may be dismissed from the clinic at the provider's discretion.      For prescription refill requests, have your pharmacy contact our office and allow 72 hours for refills to be completed.    Today you received the following chemotherapy and/or immunotherapy agents Etoposide      To help prevent nausea and vomiting after your treatment, we encourage you to take your nausea medication as directed.  BELOW ARE SYMPTOMS THAT SHOULD BE REPORTED IMMEDIATELY: *FEVER GREATER THAN 100.4 F (38 C) OR HIGHER *CHILLS OR SWEATING *NAUSEA AND VOMITING THAT IS NOT CONTROLLED WITH YOUR NAUSEA MEDICATION *UNUSUAL SHORTNESS OF BREATH *UNUSUAL BRUISING OR BLEEDING *URINARY PROBLEMS (pain or burning when urinating, or frequent urination) *BOWEL PROBLEMS (unusual diarrhea, constipation, pain near the anus) TENDERNESS IN MOUTH AND THROAT WITH OR WITHOUT PRESENCE OF ULCERS (sore throat, sores in mouth, or a toothache) UNUSUAL RASH, SWELLING OR PAIN  UNUSUAL VAGINAL DISCHARGE OR ITCHING   Items with * indicate a potential emergency and should be followed up as soon as possible or go to the Emergency Department if any problems should occur.  Please show the CHEMOTHERAPY ALERT CARD or IMMUNOTHERAPY  ALERT CARD at check-in to the Emergency Department and triage nurse.  Should you have questions after your visit or need to cancel or reschedule your appointment, please contact [facility] - A DEPT OF [facility]  [phone] and follow the prompts.  Office hours are 8:00 a.m. to 4:30 p.m. Monday - Friday. Please note that voicemails left after 4:00 p.m. may not be returned until the following business day.  We are closed weekends and major holidays. You have access to a nurse at all times for urgent questions. Please call the main number to the clinic [phone] and follow the prompts.  For any non-urgent questions, you may also contact your provider using MyChart. We now offer e-Visits for anyone 36 and older to request care online for non-urgent symptoms. For details visit [email or web address].   Also download the MyChart app! Go to the app store, search "MyChart", open the app, select [facility], and log in with your MyChart username and password.

## 2023-12-07 ENCOUNTER — Ambulatory Visit

## 2023-12-07 ENCOUNTER — Inpatient Hospital Stay

## 2023-12-07 VITALS — BP 130/74 | HR 80 | Temp 97.5°F | Resp 16

## 2023-12-07 DIAGNOSIS — Z5111 Encounter for antineoplastic chemotherapy: Secondary | ICD-10-CM | POA: Diagnosis not present

## 2023-12-07 DIAGNOSIS — C7A8 Other malignant neuroendocrine tumors: Secondary | ICD-10-CM

## 2023-12-07 MED ORDER — SODIUM CHLORIDE 0.9 % IV SOLN
100.0000 mg/m2 | Freq: Once | INTRAVENOUS | Status: AC
  Administered 2023-12-07: 224 mg via INTRAVENOUS
  Filled 2023-12-07: qty 11.2

## 2023-12-07 MED ORDER — HEPARIN SOD (PORK) LOCK FLUSH 100 UNIT/ML IV SOLN
500.0000 [IU] | Freq: Once | INTRAVENOUS | Status: AC | PRN
  Administered 2023-12-07: 500 [IU]
  Filled 2023-12-07: qty 5

## 2023-12-07 MED ORDER — SODIUM CHLORIDE 0.9 % IV SOLN
INTRAVENOUS | Status: DC
Start: 2023-12-07 — End: 2024-02-29
  Filled 2023-12-07 (×2): qty 250

## 2023-12-07 MED ORDER — DEXAMETHASONE SODIUM PHOSPHATE 10 MG/ML IJ SOLN
10.0000 mg | Freq: Once | INTRAMUSCULAR | Status: AC
  Administered 2023-12-07: 10 mg via INTRAVENOUS
  Filled 2023-12-07: qty 1

## 2023-12-07 NOTE — Progress Notes (Signed)
 Nutrition Follow-up:  Patient with stage IV endometrium/cervix cancer with neuroendocrine features.  Patient receiving carboplatin and etoposide.  Met with patient during infusion.  Reports that she is starting to get hungry now and thinking about foods to eat.  Still not eating very much.  Drinking shakes.  Ate a few bites of eggs but started to gag.  Eating soups and purchased protein powder to add to it.  Had a bite of pizza.  Bowels are moving.      Medications: reviewed  Labs: reviewed  Anthropometrics:   Weight 204 lb on 4/14 238 lb 3/24 257 lb on 3/5 242 lb on 1/29   NUTRITION DIAGNOSIS: Inadequate oral intake continues    INTERVENTION:  Continue utilizing protein powder Patient wanting to try beef gravy with bits of beef in it.  Continue trying foods and small frequent nibbles Utilize antiemetics.       MONITORING, EVALUATION, GOAL: weight trends, intake   NEXT VISIT: Wednesday, May 7 during infusion  Jahron Hunsinger B. Zollie Hipp, CSO, [name] Registered Dietitian [phone]

## 2023-12-08 ENCOUNTER — Encounter: Payer: Self-pay | Admitting: Oncology

## 2023-12-08 ENCOUNTER — Inpatient Hospital Stay

## 2023-12-08 DIAGNOSIS — Z5111 Encounter for antineoplastic chemotherapy: Secondary | ICD-10-CM | POA: Diagnosis not present

## 2023-12-08 DIAGNOSIS — C7A8 Other malignant neuroendocrine tumors: Secondary | ICD-10-CM

## 2023-12-08 LAB — SAMPLE TO BLOOD BANK

## 2023-12-08 LAB — CBC (CANCER CENTER ONLY)
HCT: 24.4 % — ABNORMAL LOW (ref 36.0–46.0)
Hemoglobin: 8 g/dL — ABNORMAL LOW (ref 12.0–15.0)
MCH: 31.7 pg (ref 26.0–34.0)
MCHC: 32.8 g/dL (ref 30.0–36.0)
MCV: 96.8 fL (ref 80.0–100.0)
Platelet Count: 541 10*3/uL — ABNORMAL HIGH (ref 150–400)
RBC: 2.52 MIL/uL — ABNORMAL LOW (ref 3.87–5.11)
RDW: 20.6 % — ABNORMAL HIGH (ref 11.5–15.5)
WBC Count: 6.1 10*3/uL (ref 4.0–10.5)
nRBC: 0 % (ref 0.0–0.2)

## 2023-12-09 ENCOUNTER — Inpatient Hospital Stay

## 2023-12-09 ENCOUNTER — Other Ambulatory Visit

## 2023-12-09 ENCOUNTER — Other Ambulatory Visit: Payer: Self-pay | Admitting: Oncology

## 2023-12-09 DIAGNOSIS — Z5111 Encounter for antineoplastic chemotherapy: Secondary | ICD-10-CM | POA: Diagnosis not present

## 2023-12-09 DIAGNOSIS — C7B8 Other secondary neuroendocrine tumors: Secondary | ICD-10-CM

## 2023-12-09 DIAGNOSIS — C7A8 Other malignant neuroendocrine tumors: Secondary | ICD-10-CM

## 2023-12-09 MED ORDER — PEGFILGRASTIM-JMDB 6 MG/0.6ML ~~LOC~~ SOSY
6.0000 mg | PREFILLED_SYRINGE | Freq: Once | SUBCUTANEOUS | Status: AC
  Administered 2023-12-09: 6 mg via SUBCUTANEOUS
  Filled 2023-12-09: qty 0.6

## 2023-12-12 ENCOUNTER — Inpatient Hospital Stay

## 2023-12-12 ENCOUNTER — Ambulatory Visit

## 2023-12-13 ENCOUNTER — Ambulatory Visit

## 2023-12-15 ENCOUNTER — Inpatient Hospital Stay

## 2023-12-15 ENCOUNTER — Telehealth: Payer: Self-pay

## 2023-12-15 ENCOUNTER — Other Ambulatory Visit: Payer: Self-pay

## 2023-12-15 ENCOUNTER — Other Ambulatory Visit: Payer: Self-pay | Admitting: Nurse Practitioner

## 2023-12-15 DIAGNOSIS — D6481 Anemia due to antineoplastic chemotherapy: Secondary | ICD-10-CM

## 2023-12-15 DIAGNOSIS — C541 Malignant neoplasm of endometrium: Secondary | ICD-10-CM

## 2023-12-15 DIAGNOSIS — N179 Acute kidney failure, unspecified: Secondary | ICD-10-CM

## 2023-12-15 DIAGNOSIS — Z5111 Encounter for antineoplastic chemotherapy: Secondary | ICD-10-CM | POA: Diagnosis not present

## 2023-12-15 LAB — CBC WITH DIFFERENTIAL (CANCER CENTER ONLY)
Abs Immature Granulocytes: 0.03 10*3/uL (ref 0.00–0.07)
Basophils Absolute: 0.1 10*3/uL (ref 0.0–0.1)
Basophils Relative: 4 %
Eosinophils Absolute: 0 10*3/uL (ref 0.0–0.5)
Eosinophils Relative: 0 %
HCT: 20 % — ABNORMAL LOW (ref 36.0–46.0)
Hemoglobin: 6.8 g/dL — CL (ref 12.0–15.0)
Immature Granulocytes: 2 %
Lymphocytes Relative: 63 %
Lymphs Abs: 0.9 10*3/uL (ref 0.7–4.0)
MCH: 32.9 pg (ref 26.0–34.0)
MCHC: 34 g/dL (ref 30.0–36.0)
MCV: 96.6 fL (ref 80.0–100.0)
Monocytes Absolute: 0.1 10*3/uL (ref 0.1–1.0)
Monocytes Relative: 10 %
Neutro Abs: 0.3 10*3/uL — CL (ref 1.7–7.7)
Neutrophils Relative %: 21 %
Platelet Count: 105 10*3/uL — ABNORMAL LOW (ref 150–400)
RBC: 2.07 MIL/uL — ABNORMAL LOW (ref 3.87–5.11)
RDW: 18.5 % — ABNORMAL HIGH (ref 11.5–15.5)
WBC Count: 1.4 10*3/uL — ABNORMAL LOW (ref 4.0–10.5)
nRBC: 0 % (ref 0.0–0.2)

## 2023-12-15 NOTE — Telephone Encounter (Signed)
 Spoke with Chase in lab.  Critical alerts: Hgb 6.8 and ANC 0.3 (read back performed).  Dr. Randy Buttery already aware.

## 2023-12-16 ENCOUNTER — Inpatient Hospital Stay

## 2023-12-16 ENCOUNTER — Encounter: Payer: Self-pay | Admitting: Nurse Practitioner

## 2023-12-16 ENCOUNTER — Other Ambulatory Visit: Payer: Self-pay

## 2023-12-16 ENCOUNTER — Telehealth: Payer: Self-pay | Admitting: *Deleted

## 2023-12-16 DIAGNOSIS — D6481 Anemia due to antineoplastic chemotherapy: Secondary | ICD-10-CM

## 2023-12-16 DIAGNOSIS — D649 Anemia, unspecified: Secondary | ICD-10-CM

## 2023-12-16 DIAGNOSIS — Z5111 Encounter for antineoplastic chemotherapy: Secondary | ICD-10-CM | POA: Diagnosis not present

## 2023-12-16 LAB — CMP (CANCER CENTER ONLY)
ALT: 18 U/L (ref 0–44)
AST: 20 U/L (ref 15–41)
Albumin: 3.9 g/dL (ref 3.5–5.0)
Alkaline Phosphatase: 68 U/L (ref 38–126)
Anion gap: 11 (ref 5–15)
BUN: 18 mg/dL (ref 6–20)
CO2: 26 mmol/L (ref 22–32)
Calcium: 9.2 mg/dL (ref 8.9–10.3)
Chloride: 91 mmol/L — ABNORMAL LOW (ref 98–111)
Creatinine: 0.48 mg/dL (ref 0.44–1.00)
GFR, Estimated: >60 mL/min (ref >60)
Glucose, Bld: 99 mg/dL (ref 70–99)
Potassium: 2.9 mmol/L — ABNORMAL LOW (ref 3.5–5.1)
Sodium: 128 mmol/L — ABNORMAL LOW (ref 135–145)
Total Bilirubin: 0.9 mg/dL (ref 0.0–1.2)
Total Protein: 7.2 g/dL (ref 6.5–8.1)

## 2023-12-16 LAB — PREPARE RBC (CROSSMATCH)

## 2023-12-16 MED ORDER — SODIUM CHLORIDE 0.9% IV SOLUTION
250.0000 mL | INTRAVENOUS | Status: DC
  Administered 2023-12-16: 100 mL via INTRAVENOUS
  Filled 2023-12-16: qty 250

## 2023-12-16 MED ORDER — HEPARIN SOD (PORK) LOCK FLUSH 100 UNIT/ML IV SOLN
500.0000 [IU] | Freq: Once | INTRAVENOUS | Status: AC
  Administered 2023-12-16: 500 [IU] via INTRAVENOUS
  Filled 2023-12-16: qty 5

## 2023-12-16 NOTE — Telephone Encounter (Signed)
 I spoke today about if she needs to have blood transfusion for today. She said that the labs was done later in the day and she did not know if she has to come or not. I told her that she had hgb. 6.8 and she will need it and she is happy about that because she is weak.

## 2023-12-17 LAB — BPAM RBC
Blood Product Expiration Date: 202505162359
ISSUE DATE / TIME: 202504251512
Unit Type and Rh: 6200

## 2023-12-17 LAB — TYPE AND SCREEN
ABO/RH(D): A POS
Antibody Screen: NEGATIVE
Unit division: 0

## 2023-12-19 ENCOUNTER — Other Ambulatory Visit

## 2023-12-20 ENCOUNTER — Encounter: Payer: Self-pay | Admitting: Oncology

## 2023-12-20 ENCOUNTER — Ambulatory Visit

## 2023-12-21 ENCOUNTER — Encounter: Payer: Self-pay | Admitting: Oncology

## 2023-12-22 ENCOUNTER — Telehealth: Payer: Self-pay

## 2023-12-22 ENCOUNTER — Inpatient Hospital Stay: Attending: Oncology

## 2023-12-22 ENCOUNTER — Other Ambulatory Visit: Payer: Self-pay

## 2023-12-22 DIAGNOSIS — D649 Anemia, unspecified: Secondary | ICD-10-CM

## 2023-12-22 DIAGNOSIS — D6481 Anemia due to antineoplastic chemotherapy: Secondary | ICD-10-CM | POA: Insufficient documentation

## 2023-12-22 DIAGNOSIS — E876 Hypokalemia: Secondary | ICD-10-CM | POA: Insufficient documentation

## 2023-12-22 DIAGNOSIS — Z79899 Other long term (current) drug therapy: Secondary | ICD-10-CM | POA: Diagnosis not present

## 2023-12-22 DIAGNOSIS — I252 Old myocardial infarction: Secondary | ICD-10-CM | POA: Diagnosis not present

## 2023-12-22 DIAGNOSIS — K5900 Constipation, unspecified: Secondary | ICD-10-CM | POA: Insufficient documentation

## 2023-12-22 DIAGNOSIS — N179 Acute kidney failure, unspecified: Secondary | ICD-10-CM

## 2023-12-22 DIAGNOSIS — T451X5A Adverse effect of antineoplastic and immunosuppressive drugs, initial encounter: Secondary | ICD-10-CM | POA: Diagnosis not present

## 2023-12-22 DIAGNOSIS — E039 Hypothyroidism, unspecified: Secondary | ICD-10-CM | POA: Insufficient documentation

## 2023-12-22 DIAGNOSIS — Z9089 Acquired absence of other organs: Secondary | ICD-10-CM | POA: Diagnosis not present

## 2023-12-22 DIAGNOSIS — Z8249 Family history of ischemic heart disease and other diseases of the circulatory system: Secondary | ICD-10-CM | POA: Diagnosis not present

## 2023-12-22 DIAGNOSIS — I2510 Atherosclerotic heart disease of native coronary artery without angina pectoris: Secondary | ICD-10-CM | POA: Insufficient documentation

## 2023-12-22 DIAGNOSIS — D75838 Other thrombocytosis: Secondary | ICD-10-CM | POA: Insufficient documentation

## 2023-12-22 DIAGNOSIS — Z5111 Encounter for antineoplastic chemotherapy: Secondary | ICD-10-CM | POA: Insufficient documentation

## 2023-12-22 DIAGNOSIS — C541 Malignant neoplasm of endometrium: Secondary | ICD-10-CM | POA: Insufficient documentation

## 2023-12-22 DIAGNOSIS — E119 Type 2 diabetes mellitus without complications: Secondary | ICD-10-CM | POA: Diagnosis not present

## 2023-12-22 DIAGNOSIS — Z800 Family history of malignant neoplasm of digestive organs: Secondary | ICD-10-CM | POA: Insufficient documentation

## 2023-12-22 DIAGNOSIS — I10 Essential (primary) hypertension: Secondary | ICD-10-CM | POA: Insufficient documentation

## 2023-12-22 DIAGNOSIS — C7A1 Malignant poorly differentiated neuroendocrine tumors: Secondary | ICD-10-CM | POA: Diagnosis present

## 2023-12-22 LAB — CBC WITH DIFFERENTIAL (CANCER CENTER ONLY)
Abs Immature Granulocytes: 0.9 10*3/uL — ABNORMAL HIGH (ref 0.00–0.07)
Basophils Absolute: 0 10*3/uL (ref 0.0–0.1)
Basophils Relative: 0 %
Eosinophils Absolute: 0 10*3/uL (ref 0.0–0.5)
Eosinophils Relative: 0 %
HCT: 25 % — ABNORMAL LOW (ref 36.0–46.0)
Hemoglobin: 8.6 g/dL — ABNORMAL LOW (ref 12.0–15.0)
Immature Granulocytes: 5 %
Lymphocytes Relative: 10 %
Lymphs Abs: 1.7 10*3/uL (ref 0.7–4.0)
MCH: 33.1 pg (ref 26.0–34.0)
MCHC: 34.4 g/dL (ref 30.0–36.0)
MCV: 96.2 fL (ref 80.0–100.0)
Monocytes Absolute: 2.3 10*3/uL — ABNORMAL HIGH (ref 0.1–1.0)
Monocytes Relative: 14 %
Neutro Abs: 11.7 10*3/uL — ABNORMAL HIGH (ref 1.7–7.7)
Neutrophils Relative %: 71 %
Platelet Count: 234 10*3/uL (ref 150–400)
RBC: 2.6 MIL/uL — ABNORMAL LOW (ref 3.87–5.11)
RDW: 21.3 % — ABNORMAL HIGH (ref 11.5–15.5)
Smear Review: NORMAL
WBC Count: 16.7 10*3/uL — ABNORMAL HIGH (ref 4.0–10.5)
nRBC: 0.6 % — ABNORMAL HIGH (ref 0.0–0.2)

## 2023-12-22 LAB — SAMPLE TO BLOOD BANK

## 2023-12-22 NOTE — Telephone Encounter (Signed)
 Based on most recent CBC checked earlier today Hgb 8.6; per Dr. Randy Buttery please cancel transfusion for tomorrow.  Outbound call to patient; informed of above.  Reviewed next coming appointment; patient has no additional questions / concerns at this time.

## 2023-12-23 ENCOUNTER — Inpatient Hospital Stay

## 2023-12-23 ENCOUNTER — Encounter: Payer: Self-pay | Admitting: Oncology

## 2023-12-23 MED FILL — Fosaprepitant Dimeglumine For IV Infusion 150 MG (Base Eq): INTRAVENOUS | Qty: 5 | Status: AC

## 2023-12-26 ENCOUNTER — Inpatient Hospital Stay

## 2023-12-26 ENCOUNTER — Inpatient Hospital Stay (HOSPITAL_BASED_OUTPATIENT_CLINIC_OR_DEPARTMENT_OTHER): Admitting: Oncology

## 2023-12-26 ENCOUNTER — Encounter: Payer: Self-pay | Admitting: Oncology

## 2023-12-26 VITALS — BP 134/77 | HR 84 | Resp 16

## 2023-12-26 VITALS — BP 139/84 | HR 95 | Temp 97.6°F | Resp 18 | Ht 64.0 in | Wt 196.5 lb

## 2023-12-26 DIAGNOSIS — C7A8 Other malignant neuroendocrine tumors: Secondary | ICD-10-CM | POA: Diagnosis not present

## 2023-12-26 DIAGNOSIS — K5909 Other constipation: Secondary | ICD-10-CM

## 2023-12-26 DIAGNOSIS — C7B8 Other secondary neuroendocrine tumors: Secondary | ICD-10-CM

## 2023-12-26 DIAGNOSIS — Z5111 Encounter for antineoplastic chemotherapy: Secondary | ICD-10-CM | POA: Diagnosis not present

## 2023-12-26 DIAGNOSIS — D6481 Anemia due to antineoplastic chemotherapy: Secondary | ICD-10-CM

## 2023-12-26 DIAGNOSIS — T451X5A Adverse effect of antineoplastic and immunosuppressive drugs, initial encounter: Secondary | ICD-10-CM

## 2023-12-26 DIAGNOSIS — C541 Malignant neoplasm of endometrium: Secondary | ICD-10-CM

## 2023-12-26 DIAGNOSIS — E876 Hypokalemia: Secondary | ICD-10-CM | POA: Diagnosis not present

## 2023-12-26 LAB — CMP (CANCER CENTER ONLY)
ALT: 13 U/L (ref 0–44)
AST: 19 U/L (ref 15–41)
Albumin: 4.1 g/dL (ref 3.5–5.0)
Alkaline Phosphatase: 65 U/L (ref 38–126)
Anion gap: 15 (ref 5–15)
BUN: 11 mg/dL (ref 6–20)
CO2: 25 mmol/L (ref 22–32)
Calcium: 9.3 mg/dL (ref 8.9–10.3)
Chloride: 91 mmol/L — ABNORMAL LOW (ref 98–111)
Creatinine: 0.7 mg/dL (ref 0.44–1.00)
GFR, Estimated: >60 mL/min (ref >60)
Glucose, Bld: 110 mg/dL — ABNORMAL HIGH (ref 70–99)
Potassium: 3.2 mmol/L — ABNORMAL LOW (ref 3.5–5.1)
Sodium: 131 mmol/L — ABNORMAL LOW (ref 135–145)
Total Bilirubin: 0.8 mg/dL (ref 0.0–1.2)
Total Protein: 7.7 g/dL (ref 6.5–8.1)

## 2023-12-26 LAB — CBC WITH DIFFERENTIAL (CANCER CENTER ONLY)
Abs Immature Granulocytes: 0.09 10*3/uL — ABNORMAL HIGH (ref 0.00–0.07)
Basophils Absolute: 0.1 10*3/uL (ref 0.0–0.1)
Basophils Relative: 1 %
Eosinophils Absolute: 0 10*3/uL (ref 0.0–0.5)
Eosinophils Relative: 0 %
HCT: 26.3 % — ABNORMAL LOW (ref 36.0–46.0)
Hemoglobin: 8.8 g/dL — ABNORMAL LOW (ref 12.0–15.0)
Immature Granulocytes: 1 %
Lymphocytes Relative: 13 %
Lymphs Abs: 1.1 10*3/uL (ref 0.7–4.0)
MCH: 32.6 pg (ref 26.0–34.0)
MCHC: 33.5 g/dL (ref 30.0–36.0)
MCV: 97.4 fL (ref 80.0–100.0)
Monocytes Absolute: 1.3 10*3/uL — ABNORMAL HIGH (ref 0.1–1.0)
Monocytes Relative: 15 %
Neutro Abs: 5.7 10*3/uL (ref 1.7–7.7)
Neutrophils Relative %: 70 %
Platelet Count: 472 10*3/uL — ABNORMAL HIGH (ref 150–400)
RBC: 2.7 MIL/uL — ABNORMAL LOW (ref 3.87–5.11)
RDW: 21.6 % — ABNORMAL HIGH (ref 11.5–15.5)
WBC Count: 8.3 10*3/uL (ref 4.0–10.5)
nRBC: 0.5 % — ABNORMAL HIGH (ref 0.0–0.2)

## 2023-12-26 LAB — SAMPLE TO BLOOD BANK

## 2023-12-26 MED ORDER — SODIUM CHLORIDE 0.9 % IV SOLN: 100.0000 mg/m2 | Freq: Once | INTRAVENOUS | Status: DC

## 2023-12-26 MED ORDER — SODIUM CHLORIDE 0.9 % IV SOLN: 600.0000 mg | Freq: Once | INTRAVENOUS | Status: DC

## 2023-12-26 MED ORDER — SODIUM CHLORIDE 0.9 % IV SOLN
150.0000 mg | Freq: Once | INTRAVENOUS | Status: AC
  Administered 2023-12-26: 150 mg via INTRAVENOUS
  Filled 2023-12-26: qty 150

## 2023-12-26 MED ORDER — PALONOSETRON HCL INJECTION 0.25 MG/5ML
0.2500 mg | Freq: Once | INTRAVENOUS | Status: AC
  Administered 2023-12-26: 0.25 mg via INTRAVENOUS
  Filled 2023-12-26: qty 5

## 2023-12-26 MED ORDER — SODIUM CHLORIDE 0.9 % IV SOLN
100.0000 mg/m2 | Freq: Once | INTRAVENOUS | Status: AC
  Administered 2023-12-26: 200 mg via INTRAVENOUS
  Filled 2023-12-26: qty 10

## 2023-12-26 MED ORDER — HEPARIN SOD (PORK) LOCK FLUSH 100 UNIT/ML IV SOLN
500.0000 [IU] | Freq: Once | INTRAVENOUS | Status: AC | PRN
  Administered 2023-12-26: 500 [IU]
  Filled 2023-12-26: qty 5

## 2023-12-26 MED ORDER — DEXAMETHASONE SODIUM PHOSPHATE 10 MG/ML IJ SOLN
10.0000 mg | Freq: Once | INTRAMUSCULAR | Status: AC
  Administered 2023-12-26: 10 mg via INTRAVENOUS
  Filled 2023-12-26: qty 1

## 2023-12-26 MED ORDER — SODIUM CHLORIDE 0.9 % IV SOLN
INTRAVENOUS | Status: DC
  Filled 2023-12-26: qty 250

## 2023-12-26 MED ORDER — SODIUM CHLORIDE 0.9 % IV SOLN
526.0000 mg | Freq: Once | INTRAVENOUS | Status: AC
  Administered 2023-12-26: 530 mg via INTRAVENOUS
  Filled 2023-12-26: qty 52.4

## 2023-12-26 NOTE — Patient Instructions (Signed)
 [facility] - A DEPT OF [facility]  Discharge Instructions: Thank you for choosing [facility] to provide your oncology and hematology care.  If you have a lab appointment with the Cancer Center, please go directly to the Cancer Center and check in at the registration area.  Wear comfortable clothing and clothing appropriate for easy access to any Portacath or PICC line.   We strive to give you quality time with your provider. You may need to reschedule your appointment if you arrive late (15 or more minutes).  Arriving late affects you and other patients whose appointments are after yours.  Also, if you miss three or more appointments without notifying the office, you may be dismissed from the clinic at the provider's discretion.      For prescription refill requests, have your pharmacy contact our office and allow 72 hours for refills to be completed.    Today you received the following chemotherapy and/or immunotherapy agents Etoposide   and carboplatin     To help prevent nausea and vomiting after your treatment, we encourage you to take your nausea medication as directed.  BELOW ARE SYMPTOMS THAT SHOULD BE REPORTED IMMEDIATELY: *FEVER GREATER THAN 100.4 F (38 C) OR HIGHER *CHILLS OR SWEATING *NAUSEA AND VOMITING THAT IS NOT CONTROLLED WITH YOUR NAUSEA MEDICATION *UNUSUAL SHORTNESS OF BREATH *UNUSUAL BRUISING OR BLEEDING *URINARY PROBLEMS (pain or burning when urinating, or frequent urination) *BOWEL PROBLEMS (unusual diarrhea, constipation, pain near the anus) TENDERNESS IN MOUTH AND THROAT WITH OR WITHOUT PRESENCE OF ULCERS (sore throat, sores in mouth, or a toothache) UNUSUAL RASH, SWELLING OR PAIN  UNUSUAL VAGINAL DISCHARGE OR ITCHING   Items with * indicate a potential emergency and should be followed up as soon as possible or go to the Emergency Department if any problems should occur.  Please show the CHEMOTHERAPY ALERT CARD or  IMMUNOTHERAPY ALERT CARD at check-in to the Emergency Department and triage nurse.  Should you have questions after your visit or need to cancel or reschedule your appointment, please contact [facility] - A DEPT OF [facility]  [phone] and follow the prompts.  Office hours are 8:00 a.m. to 4:30 p.m. Monday - Friday. Please note that voicemails left after 4:00 p.m. may not be returned until the following business day.  We are closed weekends and major holidays. You have access to a nurse at all times for urgent questions. Please call the main number to the clinic [phone] and follow the prompts.  For any non-urgent questions, you may also contact your provider using MyChart. We now offer e-Visits for anyone 8 and older to request care online for non-urgent symptoms. For details visit [email or web address].   Also download the MyChart app! Go to the app store, search "MyChart", open the app, select , and log in with your MyChart username and password.

## 2023-12-26 NOTE — Progress Notes (Signed)
 Dose of carbo changed to 530mg  and etoposide  dose changed to 200mg  based on new wt of 89.1 kg and BSA of 2.0

## 2023-12-26 NOTE — Progress Notes (Signed)
 Patient reports she is still having bowel issues regarding constipation.

## 2023-12-26 NOTE — Progress Notes (Signed)
 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  This was followed by a CT abdomen pelvis with contrast which showed widespread metastatic disease to abdominopelvic and thoracic nodes, omentum and lungs.  Abnormal appearance of the uterus and lower uterine segment and cervix favoring endometrial or cervical primary.  Patient was seen by Dr. Marella Shams and underwent endometrial and cervical biopsy as well as biopsy of the retroperitoneal lymph node.  Cervical biopsy was consistent with squamous mucosa and abundant necrotic debris highly suspicious but not definitively diagnostic for malignancy.  FNA of the retroperitoneal lymph node showed poorly differentiated carcinoma with neuroendocrine features.   Comment: The following immunohistochemistry was performed after review of the clinical history and morphology to further characterize the pathologic process. The results are as follows:   A1-14  PanCK                      positive  A1-15  P16                           Positive, patchy A1-16  P53                           Normal/wild-type  A1-17  PAX8                        Focal blush positive  A1-18  ER Dx Only              negative   A1-19  Bio Neg Ctrl              Appropriately reactive control slide  A1-22  CK7                          Positive, patchy A1-23  CK20                        negative  A1-24  GATA 3                    negative  A1-25  CDX-2                      Positive, patchy A1-26  TTF-1                        negative  A1-27  INSM-1                     Positive, patchy weak A1-28SynaptophysinPositive, patchy weak A1-31  BRG-1  Favor retained (weak) nuclear staining A1-32  BAF 47                     Retained nuclear staining A1-33  P63                           negative  A1-34  Claudin-4                  Positive, patchy    Smears and cell block show a poorly differentiated carcinoma. The immunophenotype is non-specific, but shows evidence of neuroendocrine differentiation.  Plan is for 4-6 cycles of carbo etoposide  chemotherapy if tolerated.    Interval history-she has ongoing fatigue but overall feels that her appetite is getting better as compared to when she started chemotherapy.  She does report constipation and Colace is not helping.  ECOG PS- 2 Pain scale- 0 Opioid associated constipation- no  Review of systems- Review of Systems  Constitutional:  Positive for malaise/fatigue. Negative for chills, fever and weight loss.  HENT:  Negative for congestion, ear discharge and nosebleeds.   Eyes:  Negative for blurred vision.  Respiratory:  Negative for cough, hemoptysis, sputum production, shortness of breath and wheezing.   Cardiovascular:  Negative for chest pain, palpitations, orthopnea and claudication.  Gastrointestinal:  Positive for constipation. Negative for abdominal pain, blood in stool, diarrhea, heartburn, melena, nausea and vomiting.  Genitourinary:  Negative for dysuria, flank pain, frequency, hematuria and urgency.  Musculoskeletal:  Negative for back pain, joint pain and myalgias.  Skin:  Negative for rash.  Neurological:  Negative for dizziness, tingling,  focal weakness, seizures, weakness and headaches.  Endo/Heme/Allergies:  Does not bruise/bleed easily.  Psychiatric/Behavioral:  Negative for depression and suicidal ideas. The patient does not have insomnia.       No Known Allergies   Past Medical History:  Diagnosis Date   Cancer (HCC)    Coronary artery disease    GERD (gastroesophageal reflux disease)    Hypertension    Hypothyroidism    Myocardial infarction (HCC)    Thyroid  disease      Past Surgical History:  Procedure Laterality Date   CARDIAC CATHETERIZATION     CESAREAN SECTION     CORONARY STENT INTERVENTION N/A 07/24/2019   Procedure: CORONARY STENT INTERVENTION;  Surgeon: Sammy Crisp, MD;  Location: [facility] INVASIVE CV LAB;  Service: Cardiovascular;  Laterality: N/A;  RCA & CFX   IR IMAGING GUIDED PORT INSERTION  09/23/2023   LEFT HEART CATH AND CORONARY ANGIOGRAPHY N/A 07/24/2019   Procedure: LEFT HEART CATH AND CORONARY ANGIOGRAPHY;  Surgeon: Michelle Aid, MD;  Location: [facility] INVASIVE CV LAB;  Service: Cardiovascular;  Laterality: N/A;   TONSILLECTOMY      Social History   Socioeconomic History   Marital status: Married    Spouse name: Kanyla Zeien   Number of children: 1   Years of education: Not on file   Highest education level: Not on file  Occupational History   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  Tobacco Use   Smoking status: Never   Smokeless tobacco: Never  Vaping Use   Vaping status: Never Used  Substance and Sexual Activity   Alcohol use: Not Currently    Comment: occ   Drug use: Never   Sexual activity: Not Currently  Other Topics Concern   Not on file  Social History Narrative   Not on file   Social Drivers of Health  Financial Resource Strain: Low Risk  (07/23/2019)   Overall Financial Resource Strain (CARDIA)    Difficulty of Paying Living Expenses: Not hard at all  Food Insecurity: No Food Insecurity (11/05/2023)   Hunger Vital Sign    Worried About Running Out of  Food in the Last Year: Never true    Ran Out of Food in the Last Year: Never true  Transportation Needs: No Transportation Needs (11/05/2023)   PRAPARE - Administrator, Civil Service (Medical): No    Lack of Transportation (Non-Medical): No  Physical Activity: Unknown (07/23/2019)   Exercise Vital Sign    Days of Exercise per Week: 2 days    Minutes of Exercise per Session: Not on file  Stress: No Stress Concern Present (07/23/2019)   [employer] of Occupational Health - Occupational Stress Questionnaire    Feeling of Stress : Not at all  Social Connections: Moderately Integrated (10/11/2023)   Social Connection and Isolation Panel [NHANES]    Frequency of Communication with Friends and Family: More than three times a week    Frequency of Social Gatherings with Friends and Family: Twice a week    Attends Religious Services: 1 to 4 times per year    Active Member of [employer] or Organizations: No    Attends Banker Meetings: Never    Marital Status: Married  Catering manager Violence: Not At Risk (11/05/2023)   Humiliation, Afraid, Rape, and Kick questionnaire    Fear of Current or Ex-Partner: No    Emotionally Abused: No    Physically Abused: No    Sexually Abused: No    Family History  Problem Relation Age of Onset   Atrial fibrillation Mother        Passed January 2024.   Colon cancer Mother        Diagnosed 2007   Heart Problems Father    Heart attack Father    Heart disease Father      Current Outpatient Medications:    Cholecalciferol (VITAMIN D-1000 MAX ST) 25 MCG (1000 UT) tablet, Take 1,000 Units by mouth daily., Disp: , Rfl:    lidocaine -prilocaine  (EMLA ) cream, Apply to affected area once, Disp: 30 g, Rfl: 3   metoprolol  tartrate (LOPRESSOR ) 25 MG tablet, Take 0.5 tablets (12.5 mg total) by mouth 2 (two) times daily., Disp: 15 tablet, Rfl: 0   Multiple Vitamin (MULTI-VITAMIN) tablet, Take 1 tablet by mouth daily., Disp: , Rfl:     nystatin  cream (MYCOSTATIN ), Apply topically 2 (two) times daily., Disp: 30 g, Rfl: 0   ondansetron  (ZOFRAN ) 8 MG tablet, Take 1 tablet (8 mg total) by mouth every 8 (eight) hours as needed for nausea, vomiting or refractory nausea / vomiting. Start on the third day after carboplatin ., Disp: 30 tablet, Rfl: 1   potassium chloride  (KLOR-CON  M) 10 MEQ tablet, Take 2 tablets (20 mEq total) by mouth 2 (two) times daily., Disp: 120 tablet, Rfl: 1   prochlorperazine  (COMPAZINE ) 10 MG tablet, Take 1 tablet (10 mg total) by mouth every 6 (six) hours as needed for nausea or vomiting., Disp: 30 tablet, Rfl: 1   sodium chloride  1 g tablet, Take 1 tablet (1 g total) by mouth 2 (two) times daily with a meal., Disp: 60 tablet, Rfl: 1   thyroid  (ARMOUR) 120 MG tablet, Take 120 mg by mouth daily before breakfast., Disp: , Rfl:    Vitamin D, Ergocalciferol, (DRISDOL) 1.25 MG (50000 UNIT) CAPS capsule, Take 50,000 Units by mouth once  a week., Disp: , Rfl:  No current facility-administered medications for this visit.  Facility-Administered Medications Ordered in Other Visits:    0.9 %  sodium chloride  infusion, , Intravenous, Continuous, Avonne Boettcher, MD, Last Rate: 10 mL/hr at 12/07/23 1421, New Bag at 12/07/23 1421   0.9 %  sodium chloride  infusion, , Intravenous, Continuous, Nelda Balsam, NP, Last Rate: 10 mL/hr at 12/26/23 1114, New Bag at 12/26/23 1114   etoposide  (VEPESID ) 200 mg in sodium chloride  0.9 % 500 mL chemo infusion, 100 mg/m2 (Treatment Plan Recorded), Intravenous, Once, Avonne Boettcher, MD, Last Rate: 510 mL/hr at 12/26/23 1306, 200 mg at 12/26/23 1306   heparin  lock flush 100 unit/mL, 500 Units, Intracatheter, Once PRN, Nelda Balsam, NP  Physical exam:  Vitals:   12/26/23 1030 12/26/23 1034  BP: (!) 150/81 139/84  Pulse: 95   Resp: 18   Temp: 97.6 F (36.4 C)   TempSrc: Tympanic   SpO2: 99%   Weight: 196 lb 8 oz (89.1 kg)   Height: 5\' 4"  (1.626 m)    Physical Exam Constitutional:       Comments: Sitting in a wheelchair.  Appears fatigued  Cardiovascular:     Rate and Rhythm: Normal rate and regular rhythm.     Heart sounds: Normal heart sounds.  Pulmonary:     Effort: Pulmonary effort is normal.     Breath sounds: Normal breath sounds.  Abdominal:     General: Bowel sounds are normal.     Palpations: Abdomen is soft.  Skin:    General: Skin is warm and dry.  Neurological:     Mental Status: She is alert and oriented to person, place, and time.      I have personally reviewed labs listed below:    Latest Ref Rng & Units 12/26/2023   10:06 AM  CMP  Glucose 70 - 99 mg/dL 829   BUN 6 - 20 mg/dL 11   Creatinine 5.62 - 1.00 mg/dL 1.30   Sodium 865 - 784 mmol/L 131   Potassium 3.5 - 5.1 mmol/L 3.2   Chloride 98 - 111 mmol/L 91   CO2 22 - 32 mmol/L 25   Calcium  8.9 - 10.3 mg/dL 9.3   Total Protein 6.5 - 8.1 g/dL 7.7   Total Bilirubin 0.0 - 1.2 mg/dL 0.8   Alkaline Phos 38 - 126 U/L 65   AST 15 - 41 U/L 19   ALT 0 - 44 U/L 13       Latest Ref Rng & Units 12/26/2023   10:06 AM  CBC  WBC 4.0 - 10.5 K/uL 8.3   Hemoglobin 12.0 - 15.0 g/dL 8.8   Hematocrit 69.6 - 46.0 % 26.3   Platelets 150 - 400 K/uL 472      Assessment and plan- Patient is a 58 y.o. 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  She is here for on treatment assessment prior to cycle 5 of carbo etoposide  chemotherapy  Counts okay to proceed with carbo etoposide  Chemotherapy cycle 5 today.  She will receive etoposide  on day 2 and day 3 as well.  I am giving her carboplatin  at a reduced dose of AUC 4 given chemo induced anemia.  She will also receive growth factor support with this cycle.  3 weeks from today's memorial day and therefore I am giving her 1 additional week off and I will see her back in 4 weeks  for cycle 5.  I will also see her back in 10 days with CBC with differential and BMP for possible blood  transfusion.Plan is to do 6 cycles of carbo etoposide  chemotherapy followed by repeat scans    Hypokalemia: We will give her 20 mill equivalents of IV potassium with 1 L of IV fluids tomorrow.  Constipation: I have encouraged her to try MiraLAX  along with senna instead of Colace.   Chemo induced anemia: Stable continue to monitor and transfuse as needed  Thrombocytosis: Reactive continue to monitor.   Visit Diagnosis 1. 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 Encounter for antineoplastic chemotherapy   3. Antineoplastic chemotherapy induced anemia   4. Hypokalemia   5. Other constipation      Dr. Seretha Dance, MD, MPH [facility] [phone] 12/26/2023 1:25 PM

## 2023-12-27 ENCOUNTER — Other Ambulatory Visit: Payer: Self-pay

## 2023-12-27 ENCOUNTER — Ambulatory Visit

## 2023-12-27 ENCOUNTER — Inpatient Hospital Stay

## 2023-12-27 ENCOUNTER — Other Ambulatory Visit: Payer: Self-pay | Admitting: Oncology

## 2023-12-27 VITALS — BP 140/79 | HR 71 | Temp 98.9°F | Resp 19

## 2023-12-27 DIAGNOSIS — C7A8 Other malignant neuroendocrine tumors: Secondary | ICD-10-CM

## 2023-12-27 DIAGNOSIS — Z5111 Encounter for antineoplastic chemotherapy: Secondary | ICD-10-CM | POA: Diagnosis not present

## 2023-12-27 DIAGNOSIS — E876 Hypokalemia: Secondary | ICD-10-CM

## 2023-12-27 MED ORDER — SODIUM CHLORIDE 0.9% FLUSH
10.0000 mL | INTRAVENOUS | Status: DC | PRN
  Filled 2023-12-27: qty 10

## 2023-12-27 MED ORDER — DEXAMETHASONE SODIUM PHOSPHATE 10 MG/ML IJ SOLN
10.0000 mg | Freq: Once | INTRAMUSCULAR | Status: AC
  Administered 2023-12-27: 10 mg via INTRAVENOUS
  Filled 2023-12-27: qty 1

## 2023-12-27 MED ORDER — POTASSIUM CHLORIDE 10 MEQ/100ML IV SOLN
10.0000 meq | Freq: Once | INTRAVENOUS | Status: AC
Start: 2023-12-27 — End: 2023-12-27
  Administered 2023-12-27: 10 meq via INTRAVENOUS
  Filled 2023-12-27: qty 100

## 2023-12-27 MED ORDER — HEPARIN SOD (PORK) LOCK FLUSH 100 UNIT/ML IV SOLN
500.0000 [IU] | Freq: Once | INTRAVENOUS | Status: AC | PRN
  Administered 2023-12-27: 500 [IU]
  Filled 2023-12-27: qty 5

## 2023-12-27 MED ORDER — SODIUM CHLORIDE 0.9 % IV SOLN
100.0000 mg/m2 | Freq: Once | INTRAVENOUS | Status: AC
  Administered 2023-12-27: 200 mg via INTRAVENOUS
  Filled 2023-12-27: qty 10

## 2023-12-27 MED ORDER — SODIUM CHLORIDE 0.9 % IV SOLN
INTRAVENOUS | Status: DC
Start: 2023-12-27 — End: 2023-12-27
  Filled 2023-12-27: qty 250

## 2023-12-28 ENCOUNTER — Inpatient Hospital Stay

## 2023-12-28 ENCOUNTER — Ambulatory Visit

## 2023-12-28 VITALS — BP 142/79 | HR 81 | Temp 97.4°F

## 2023-12-28 DIAGNOSIS — C7A8 Other malignant neuroendocrine tumors: Secondary | ICD-10-CM

## 2023-12-28 DIAGNOSIS — E876 Hypokalemia: Secondary | ICD-10-CM

## 2023-12-28 DIAGNOSIS — Z5111 Encounter for antineoplastic chemotherapy: Secondary | ICD-10-CM | POA: Diagnosis not present

## 2023-12-28 MED ORDER — SODIUM CHLORIDE 0.9 % IV SOLN
INTRAVENOUS | Status: DC
  Filled 2023-12-28 (×2): qty 250

## 2023-12-28 MED ORDER — POTASSIUM CHLORIDE 10 MEQ/100ML IV SOLN
10.0000 meq | Freq: Once | INTRAVENOUS | Status: AC
  Administered 2023-12-28: 10 meq via INTRAVENOUS
  Filled 2023-12-28: qty 100

## 2023-12-28 MED ORDER — DEXAMETHASONE SODIUM PHOSPHATE 10 MG/ML IJ SOLN
10.0000 mg | Freq: Once | INTRAMUSCULAR | Status: AC
  Administered 2023-12-28: 10 mg via INTRAVENOUS
  Filled 2023-12-28: qty 1

## 2023-12-28 MED ORDER — SODIUM CHLORIDE 0.9 % IV SOLN
100.0000 mg/m2 | Freq: Once | INTRAVENOUS | Status: AC
  Administered 2023-12-28: 200 mg via INTRAVENOUS
  Filled 2023-12-28: qty 10

## 2023-12-28 MED ORDER — HEPARIN SOD (PORK) LOCK FLUSH 100 UNIT/ML IV SOLN
500.0000 [IU] | Freq: Once | INTRAVENOUS | Status: AC | PRN
  Administered 2023-12-28: 500 [IU]
  Filled 2023-12-28: qty 5

## 2023-12-28 NOTE — Patient Instructions (Signed)

## 2023-12-28 NOTE — Progress Notes (Signed)
 Nutrition Follow-up:  Patient with stage IV endometrium/cervix cancer with neuroendocrine features.  Patient receiving carboplatin  and etoposide .    Met with patient during infusion.  Reports that her appetite is a little bit better.  Adding a few more foods.  Reports that he has been about to eat pizza, popcorn, [employer] chicken pot pie, barbecue pork, ham, mashed potatoes.  Drinking ensure max protein in the am and sometimes will mix with yogurt, honey, peanut butter, mixed fruit, spinach.  Reports firmer stool and taking colace.    Medications: reviewed  Labs: reviewed  Anthropometrics:   Weight 196 lb on 5/5 204 lb on 4/14 238 lb 3/24 257 lb on 3/5 242 lb on 1/29   NUTRITION DIAGNOSIS: Inadequate oral intake continues   INTERVENTION:  Continue protein powder and oral nutrition supplement Continue trying foods for added calories and protein Patient has RD contact information    MONITORING, EVALUATION, GOAL: weight trends, intake   NEXT VISIT: as needed  Hatice Bubel B. Zollie Hipp, CSO, [name] Registered Dietitian [phone]

## 2023-12-29 ENCOUNTER — Telehealth: Payer: Self-pay | Admitting: *Deleted

## 2023-12-29 NOTE — Telephone Encounter (Signed)
 She always lands up decompensating post chemo and has been hospitalized. Which is why I wanted to her post chemo. If the appt can be moved to accommodate her 3.15 preference that would be great

## 2023-12-29 NOTE — Telephone Encounter (Signed)
 She says that her husband has to get off work and bring her here and so she usually gets appointments like 3:15.  She also wonders why she has a appointment with Dr. Randy Buttery on the same day she usually just calls and tells me if I need to come in or not based on the labs.  They usually call and say yes you do not need the blood infusion ithis is necessary for her because she cannot drive and they only have 1 person in the family that the husband and it would help them to be later so that he could be work more hours.

## 2023-12-30 ENCOUNTER — Inpatient Hospital Stay

## 2023-12-30 DIAGNOSIS — C7A8 Other malignant neuroendocrine tumors: Secondary | ICD-10-CM

## 2023-12-30 DIAGNOSIS — Z5111 Encounter for antineoplastic chemotherapy: Secondary | ICD-10-CM | POA: Diagnosis not present

## 2023-12-30 MED ORDER — PEGFILGRASTIM-JMDB 6 MG/0.6ML ~~LOC~~ SOSY
6.0000 mg | PREFILLED_SYRINGE | Freq: Once | SUBCUTANEOUS | Status: AC
  Administered 2023-12-30: 6 mg via SUBCUTANEOUS
  Filled 2023-12-30: qty 0.6

## 2024-01-04 ENCOUNTER — Inpatient Hospital Stay

## 2024-01-04 ENCOUNTER — Ambulatory Visit

## 2024-01-04 ENCOUNTER — Other Ambulatory Visit

## 2024-01-04 ENCOUNTER — Ambulatory Visit: Admitting: Oncology

## 2024-01-04 ENCOUNTER — Telehealth: Payer: Self-pay

## 2024-01-04 NOTE — Telephone Encounter (Signed)
 Patient was supposed to present to center for hold tube today and possible transfusion tomorrow.  Patient did not come to clinic for labs, said she wasn't feeling well.  Patient inquired if she can come for hold tube tomorrow with possible transfusion on Friday, Monday or Tuesday; there is no chair availability this Friday.  Per Dr. Randy Buttery, IVF fluids tomorrow. We can check CBC but if she does need a transfusion she would have to go to the ER.  Alyson will keep us  posted If anything changes with the chair space for Friday to fit blood in.  Outbound call to patient; informed of above.  Confirmed with IF IVF and labs tomorrow only; patient refused to come to clinic for labs with possible transfusion in the ER.  Patient requested to come in Monday for labs, Tuesday possible transfusion.  Patient was put on the schedule; after reviewing appointment times  with patient she is requesting to come for labs [facility] 5/19 @ 3:15PM instead.  Informed patient I would message the scheduling team and let them know and someone will be following up with her tomorrow.  *Also spoke w/ Allyson and requested for the Tuesday 01/10/24 appointment if it can be adjusted to 4.5 hrs instead and in the comments section show IVF possible blood transfusion

## 2024-01-04 NOTE — Telephone Encounter (Signed)
 Patient states she is not feeling too well, she cannot go to the bathroom.  Says her bowel movements are hard, takes Colace and today took Miralax  as well. Says the problem is with her anus; says if doesn't feel "as big as it used to be", she thinks it's harder to get out and has been experiencing for more than a month.  More times than not, she has to take a baby wipe and manually extract or manually "dig it out" because it's just not coming out.  She had to do this multiple times today.  Feels like she has internal hemorrhoids, very sore, bleeds slightly sometimes but more because she's digging stool out.  Stool is going down to "the hole" but it doesnt come out, her body starts shaking and is in so much pain.  Says it could be in part due to her trying to eat more.  Says she's staying hydrated and drinking two 24-32 water  bottles daily.  Informed patient I would follow up after touching base with Dr. Randy Buttery; patient verbalized understanding.

## 2024-01-05 ENCOUNTER — Encounter: Payer: Self-pay | Admitting: Oncology

## 2024-01-05 ENCOUNTER — Inpatient Hospital Stay

## 2024-01-05 ENCOUNTER — Telehealth: Payer: Self-pay | Admitting: *Deleted

## 2024-01-05 ENCOUNTER — Other Ambulatory Visit

## 2024-01-05 NOTE — Telephone Encounter (Signed)
 Patient called and said that she missed your telephone call of Josh and she will keep her phone right by her if he could call back.  Telephone number [phone]

## 2024-01-05 NOTE — Telephone Encounter (Signed)
 Per Dr. Randy Buttery "I have asked josh to call her with recs for constipation".

## 2024-01-05 NOTE — Telephone Encounter (Signed)
 Had a long conversation with patient regarding constipation management.  She has manually disimpacted herself.  Last bowel movement yesterday.  Most recently, patient has just been taking daily Colace.  I recommended adding MiraLAX  and Senokot and discussed adjustment of dosing to achieve a target of a soft stool daily or every other day.

## 2024-01-05 NOTE — Telephone Encounter (Addendum)
 Lab appointment has been updated to 3:15PM as requested on 01/09/24.  Infusion appointment for 01/10/24 at 11:30AM for fluids and possible blood.  Spoke with scheduling team; there was some confusion surrounding whether or not the patient is coming for IV fluids today.  When scheduling spoke with patient she indicated she could not come in for IVF today.  Outbound call to patient; reviewed the above information with her.  Patient verbalized understanding and has no additional questions at this time.

## 2024-01-05 NOTE — Telephone Encounter (Signed)
 Can you please reach out to her for constipation recs? She was supposed to come yesterday for labs and possible transfusion today but did nto come

## 2024-01-09 ENCOUNTER — Other Ambulatory Visit

## 2024-01-09 ENCOUNTER — Inpatient Hospital Stay

## 2024-01-09 ENCOUNTER — Ambulatory Visit: Payer: Self-pay | Admitting: Oncology

## 2024-01-09 ENCOUNTER — Other Ambulatory Visit: Payer: Self-pay | Admitting: Oncology

## 2024-01-09 DIAGNOSIS — Z5111 Encounter for antineoplastic chemotherapy: Secondary | ICD-10-CM | POA: Diagnosis not present

## 2024-01-09 DIAGNOSIS — N179 Acute kidney failure, unspecified: Secondary | ICD-10-CM

## 2024-01-09 DIAGNOSIS — D649 Anemia, unspecified: Secondary | ICD-10-CM

## 2024-01-09 DIAGNOSIS — C541 Malignant neoplasm of endometrium: Secondary | ICD-10-CM

## 2024-01-09 LAB — CBC WITH DIFFERENTIAL (CANCER CENTER ONLY)
Abs Immature Granulocytes: 1.67 10*3/uL — ABNORMAL HIGH (ref 0.00–0.07)
Basophils Absolute: 0 10*3/uL (ref 0.0–0.1)
Basophils Relative: 0 %
Eosinophils Absolute: 0 10*3/uL (ref 0.0–0.5)
Eosinophils Relative: 0 %
HCT: 23.2 % — ABNORMAL LOW (ref 36.0–46.0)
Hemoglobin: 7.9 g/dL — ABNORMAL LOW (ref 12.0–15.0)
Immature Granulocytes: 12 %
Lymphocytes Relative: 13 %
Lymphs Abs: 1.9 10*3/uL (ref 0.7–4.0)
MCH: 33.3 pg (ref 26.0–34.0)
MCHC: 34.1 g/dL (ref 30.0–36.0)
MCV: 97.9 fL (ref 80.0–100.0)
Monocytes Absolute: 2 10*3/uL — ABNORMAL HIGH (ref 0.1–1.0)
Monocytes Relative: 15 %
Neutro Abs: 8.3 10*3/uL — ABNORMAL HIGH (ref 1.7–7.7)
Neutrophils Relative %: 60 %
Platelet Count: 82 10*3/uL — ABNORMAL LOW (ref 150–400)
RBC: 2.37 MIL/uL — ABNORMAL LOW (ref 3.87–5.11)
RDW: 19.7 % — ABNORMAL HIGH (ref 11.5–15.5)
Smear Review: NORMAL
WBC Count: 13.9 10*3/uL — ABNORMAL HIGH (ref 4.0–10.5)
nRBC: 0.9 % — ABNORMAL HIGH (ref 0.0–0.2)

## 2024-01-09 LAB — SAMPLE TO BLOOD BANK

## 2024-01-09 LAB — PREPARE RBC (CROSSMATCH)

## 2024-01-10 ENCOUNTER — Ambulatory Visit

## 2024-01-10 ENCOUNTER — Inpatient Hospital Stay

## 2024-01-10 VITALS — BP 126/70 | HR 83 | Temp 96.2°F | Resp 18

## 2024-01-10 DIAGNOSIS — Z95828 Presence of other vascular implants and grafts: Secondary | ICD-10-CM

## 2024-01-10 DIAGNOSIS — Z5111 Encounter for antineoplastic chemotherapy: Secondary | ICD-10-CM | POA: Diagnosis not present

## 2024-01-10 DIAGNOSIS — D649 Anemia, unspecified: Secondary | ICD-10-CM

## 2024-01-10 MED ORDER — ACETAMINOPHEN 325 MG PO TABS
650.0000 mg | ORAL_TABLET | Freq: Once | ORAL | Status: AC
  Administered 2024-01-10: 650 mg via ORAL
  Filled 2024-01-10: qty 2

## 2024-01-10 MED ORDER — HEPARIN SOD (PORK) LOCK FLUSH 100 UNIT/ML IV SOLN
500.0000 [IU] | Freq: Once | INTRAVENOUS | Status: AC
  Administered 2024-01-10: 500 [IU] via INTRAVENOUS
  Filled 2024-01-10: qty 5

## 2024-01-10 MED ORDER — SODIUM CHLORIDE 0.9% IV SOLUTION
250.0000 mL | INTRAVENOUS | Status: DC
  Administered 2024-01-10: 100 mL via INTRAVENOUS
  Filled 2024-01-10: qty 250

## 2024-01-10 MED ORDER — SODIUM CHLORIDE 0.9% FLUSH
10.0000 mL | INTRAVENOUS | Status: DC | PRN
  Filled 2024-01-10: qty 10

## 2024-01-11 LAB — BPAM RBC
Blood Product Expiration Date: 202505292359
ISSUE DATE / TIME: 202505201214
Unit Type and Rh: 6200

## 2024-01-11 LAB — TYPE AND SCREEN
ABO/RH(D): A POS
Antibody Screen: NEGATIVE
Unit division: 0

## 2024-01-18 ENCOUNTER — Inpatient Hospital Stay

## 2024-01-18 ENCOUNTER — Other Ambulatory Visit: Payer: Self-pay

## 2024-01-18 DIAGNOSIS — C541 Malignant neoplasm of endometrium: Secondary | ICD-10-CM

## 2024-01-18 DIAGNOSIS — Z5111 Encounter for antineoplastic chemotherapy: Secondary | ICD-10-CM | POA: Diagnosis not present

## 2024-01-18 DIAGNOSIS — E876 Hypokalemia: Secondary | ICD-10-CM

## 2024-01-18 DIAGNOSIS — N179 Acute kidney failure, unspecified: Secondary | ICD-10-CM

## 2024-01-18 LAB — CBC WITH DIFFERENTIAL (CANCER CENTER ONLY)
Abs Immature Granulocytes: 0.06 10*3/uL (ref 0.00–0.07)
Basophils Absolute: 0.1 10*3/uL (ref 0.0–0.1)
Basophils Relative: 1 %
Eosinophils Absolute: 0 10*3/uL (ref 0.0–0.5)
Eosinophils Relative: 0 %
HCT: 27 % — ABNORMAL LOW (ref 36.0–46.0)
Hemoglobin: 9.1 g/dL — ABNORMAL LOW (ref 12.0–15.0)
Immature Granulocytes: 1 %
Lymphocytes Relative: 11 %
Lymphs Abs: 1.1 10*3/uL (ref 0.7–4.0)
MCH: 32.9 pg (ref 26.0–34.0)
MCHC: 33.7 g/dL (ref 30.0–36.0)
MCV: 97.5 fL (ref 80.0–100.0)
Monocytes Absolute: 1.4 10*3/uL — ABNORMAL HIGH (ref 0.1–1.0)
Monocytes Relative: 15 %
Neutro Abs: 7.2 10*3/uL (ref 1.7–7.7)
Neutrophils Relative %: 72 %
Platelet Count: 392 10*3/uL (ref 150–400)
RBC: 2.77 MIL/uL — ABNORMAL LOW (ref 3.87–5.11)
RDW: 20.3 % — ABNORMAL HIGH (ref 11.5–15.5)
WBC Count: 10 10*3/uL (ref 4.0–10.5)
nRBC: 0 % (ref 0.0–0.2)

## 2024-01-18 LAB — CMP (CANCER CENTER ONLY)
ALT: 12 U/L (ref 0–44)
AST: 20 U/L (ref 15–41)
Albumin: 4 g/dL (ref 3.5–5.0)
Alkaline Phosphatase: 50 U/L (ref 38–126)
Anion gap: 13 (ref 5–15)
BUN: 15 mg/dL (ref 6–20)
CO2: 25 mmol/L (ref 22–32)
Calcium: 9.3 mg/dL (ref 8.9–10.3)
Chloride: 92 mmol/L — ABNORMAL LOW (ref 98–111)
Creatinine: 0.57 mg/dL (ref 0.44–1.00)
GFR, Estimated: >60 mL/min (ref >60)
Glucose, Bld: 106 mg/dL — ABNORMAL HIGH (ref 70–99)
Potassium: 3.3 mmol/L — ABNORMAL LOW (ref 3.5–5.1)
Sodium: 130 mmol/L — ABNORMAL LOW (ref 135–145)
Total Bilirubin: 1.3 mg/dL — ABNORMAL HIGH (ref 0.0–1.2)
Total Protein: 7.2 g/dL (ref 6.5–8.1)

## 2024-01-18 LAB — MAGNESIUM: Magnesium: 1.7 mg/dL (ref 1.7–2.4)

## 2024-01-18 LAB — SAMPLE TO BLOOD BANK

## 2024-01-18 MED ORDER — POTASSIUM CHLORIDE 10 MEQ/100ML IV SOLN
10.0000 meq | Freq: Once | INTRAVENOUS | Status: AC
  Administered 2024-01-18: 10 meq via INTRAVENOUS
  Filled 2024-01-18: qty 100

## 2024-01-18 MED ORDER — HEPARIN SOD (PORK) LOCK FLUSH 100 UNIT/ML IV SOLN
500.0000 [IU] | Freq: Once | INTRAVENOUS | Status: AC
  Administered 2024-01-18: 500 [IU] via INTRAVENOUS
  Filled 2024-01-18: qty 5

## 2024-01-18 MED ORDER — SODIUM CHLORIDE 0.9 % IV SOLN
Freq: Once | INTRAVENOUS | Status: AC
  Filled 2024-01-18: qty 250

## 2024-01-19 ENCOUNTER — Encounter: Payer: Self-pay | Admitting: Oncology

## 2024-01-20 MED FILL — Fosaprepitant Dimeglumine For IV Infusion 150 MG (Base Eq): INTRAVENOUS | Qty: 5 | Status: AC

## 2024-01-23 ENCOUNTER — Inpatient Hospital Stay (HOSPITAL_BASED_OUTPATIENT_CLINIC_OR_DEPARTMENT_OTHER): Attending: Oncology | Admitting: Oncology

## 2024-01-23 ENCOUNTER — Inpatient Hospital Stay

## 2024-01-23 ENCOUNTER — Encounter: Payer: Self-pay | Admitting: Oncology

## 2024-01-23 ENCOUNTER — Inpatient Hospital Stay: Attending: Oncology

## 2024-01-23 VITALS — BP 134/89 | HR 94 | Temp 98.8°F | Resp 18 | Ht 64.0 in | Wt 190.2 lb

## 2024-01-23 VITALS — BP 143/78 | HR 77 | Temp 96.0°F | Resp 19

## 2024-01-23 DIAGNOSIS — E119 Type 2 diabetes mellitus without complications: Secondary | ICD-10-CM | POA: Diagnosis not present

## 2024-01-23 DIAGNOSIS — Z800 Family history of malignant neoplasm of digestive organs: Secondary | ICD-10-CM | POA: Insufficient documentation

## 2024-01-23 DIAGNOSIS — C541 Malignant neoplasm of endometrium: Secondary | ICD-10-CM | POA: Insufficient documentation

## 2024-01-23 DIAGNOSIS — T465X6A Underdosing of other antihypertensive drugs, initial encounter: Secondary | ICD-10-CM | POA: Diagnosis not present

## 2024-01-23 DIAGNOSIS — Z5111 Encounter for antineoplastic chemotherapy: Secondary | ICD-10-CM

## 2024-01-23 DIAGNOSIS — Z5189 Encounter for other specified aftercare: Secondary | ICD-10-CM | POA: Insufficient documentation

## 2024-01-23 DIAGNOSIS — C7A1 Malignant poorly differentiated neuroendocrine tumors: Secondary | ICD-10-CM | POA: Diagnosis present

## 2024-01-23 DIAGNOSIS — Z9221 Personal history of antineoplastic chemotherapy: Secondary | ICD-10-CM | POA: Diagnosis not present

## 2024-01-23 DIAGNOSIS — C7B8 Other secondary neuroendocrine tumors: Secondary | ICD-10-CM

## 2024-01-23 DIAGNOSIS — I10 Essential (primary) hypertension: Secondary | ICD-10-CM | POA: Diagnosis not present

## 2024-01-23 DIAGNOSIS — Z8249 Family history of ischemic heart disease and other diseases of the circulatory system: Secondary | ICD-10-CM | POA: Diagnosis not present

## 2024-01-23 DIAGNOSIS — R5383 Other fatigue: Secondary | ICD-10-CM | POA: Insufficient documentation

## 2024-01-23 DIAGNOSIS — I2510 Atherosclerotic heart disease of native coronary artery without angina pectoris: Secondary | ICD-10-CM | POA: Insufficient documentation

## 2024-01-23 DIAGNOSIS — D649 Anemia, unspecified: Secondary | ICD-10-CM | POA: Insufficient documentation

## 2024-01-23 DIAGNOSIS — K5900 Constipation, unspecified: Secondary | ICD-10-CM | POA: Diagnosis not present

## 2024-01-23 DIAGNOSIS — Z9089 Acquired absence of other organs: Secondary | ICD-10-CM | POA: Insufficient documentation

## 2024-01-23 DIAGNOSIS — Z79899 Other long term (current) drug therapy: Secondary | ICD-10-CM | POA: Insufficient documentation

## 2024-01-23 DIAGNOSIS — D72829 Elevated white blood cell count, unspecified: Secondary | ICD-10-CM | POA: Diagnosis not present

## 2024-01-23 DIAGNOSIS — I252 Old myocardial infarction: Secondary | ICD-10-CM | POA: Insufficient documentation

## 2024-01-23 DIAGNOSIS — E039 Hypothyroidism, unspecified: Secondary | ICD-10-CM | POA: Diagnosis not present

## 2024-01-23 DIAGNOSIS — Z91148 Patient's other noncompliance with medication regimen for other reason: Secondary | ICD-10-CM | POA: Insufficient documentation

## 2024-01-23 DIAGNOSIS — C7A8 Other malignant neuroendocrine tumors: Secondary | ICD-10-CM

## 2024-01-23 LAB — CMP (CANCER CENTER ONLY)
ALT: 11 U/L (ref 0–44)
AST: 23 U/L (ref 15–41)
Albumin: 3.7 g/dL (ref 3.5–5.0)
Alkaline Phosphatase: 49 U/L (ref 38–126)
Anion gap: 14 (ref 5–15)
BUN: 13 mg/dL (ref 6–20)
CO2: 24 mmol/L (ref 22–32)
Calcium: 9.3 mg/dL (ref 8.9–10.3)
Chloride: 97 mmol/L — ABNORMAL LOW (ref 98–111)
Creatinine: 0.55 mg/dL (ref 0.44–1.00)
GFR, Estimated: >60 mL/min (ref >60)
Glucose, Bld: 111 mg/dL — ABNORMAL HIGH (ref 70–99)
Potassium: 3.2 mmol/L — ABNORMAL LOW (ref 3.5–5.1)
Sodium: 135 mmol/L (ref 135–145)
Total Bilirubin: 1 mg/dL (ref 0.0–1.2)
Total Protein: 7.5 g/dL (ref 6.5–8.1)

## 2024-01-23 LAB — CBC WITH DIFFERENTIAL (CANCER CENTER ONLY)
Abs Immature Granulocytes: 0.02 10*3/uL (ref 0.00–0.07)
Basophils Absolute: 0.1 10*3/uL (ref 0.0–0.1)
Basophils Relative: 2 %
Eosinophils Absolute: 0 10*3/uL (ref 0.0–0.5)
Eosinophils Relative: 1 %
HCT: 29.4 % — ABNORMAL LOW (ref 36.0–46.0)
Hemoglobin: 9.6 g/dL — ABNORMAL LOW (ref 12.0–15.0)
Immature Granulocytes: 0 %
Lymphocytes Relative: 23 %
Lymphs Abs: 1.2 10*3/uL (ref 0.7–4.0)
MCH: 32.7 pg (ref 26.0–34.0)
MCHC: 32.7 g/dL (ref 30.0–36.0)
MCV: 100 fL (ref 80.0–100.0)
Monocytes Absolute: 1.1 10*3/uL — ABNORMAL HIGH (ref 0.1–1.0)
Monocytes Relative: 21 %
Neutro Abs: 2.9 10*3/uL (ref 1.7–7.7)
Neutrophils Relative %: 53 %
Platelet Count: 367 10*3/uL (ref 150–400)
RBC: 2.94 MIL/uL — ABNORMAL LOW (ref 3.87–5.11)
RDW: 19.7 % — ABNORMAL HIGH (ref 11.5–15.5)
WBC Count: 5.3 10*3/uL (ref 4.0–10.5)
nRBC: 0 % (ref 0.0–0.2)

## 2024-01-23 MED ORDER — SODIUM CHLORIDE 0.9 % IV SOLN
526.0000 mg | Freq: Once | INTRAVENOUS | Status: AC
  Administered 2024-01-23: 530 mg via INTRAVENOUS
  Filled 2024-01-23: qty 53

## 2024-01-23 MED ORDER — PALONOSETRON HCL INJECTION 0.25 MG/5ML
0.2500 mg | Freq: Once | INTRAVENOUS | Status: AC
  Administered 2024-01-23: 0.25 mg via INTRAVENOUS
  Filled 2024-01-23: qty 5

## 2024-01-23 MED ORDER — SODIUM CHLORIDE 0.9 % IV SOLN
100.0000 mg/m2 | Freq: Once | INTRAVENOUS | Status: AC
  Administered 2024-01-23: 200 mg via INTRAVENOUS
  Filled 2024-01-23: qty 10

## 2024-01-23 MED ORDER — HEPARIN SOD (PORK) LOCK FLUSH 100 UNIT/ML IV SOLN
500.0000 [IU] | Freq: Once | INTRAVENOUS | Status: AC | PRN
  Administered 2024-01-23: 500 [IU]
  Filled 2024-01-23: qty 5

## 2024-01-23 MED ORDER — DEXAMETHASONE SODIUM PHOSPHATE 10 MG/ML IJ SOLN
10.0000 mg | Freq: Once | INTRAMUSCULAR | Status: AC
  Administered 2024-01-23: 10 mg via INTRAVENOUS
  Filled 2024-01-23: qty 1

## 2024-01-23 MED ORDER — SODIUM CHLORIDE 0.9 % IV SOLN
INTRAVENOUS | Status: DC
  Filled 2024-01-23: qty 250

## 2024-01-23 MED ORDER — SODIUM CHLORIDE 0.9 % IV SOLN
150.0000 mg | Freq: Once | INTRAVENOUS | Status: AC
  Administered 2024-01-23: 150 mg via INTRAVENOUS
  Filled 2024-01-23: qty 150

## 2024-01-23 NOTE — Progress Notes (Signed)
 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  This was followed by a CT abdomen pelvis with contrast which showed widespread metastatic disease to abdominopelvic and thoracic nodes, omentum and lungs.  Abnormal appearance of the uterus and lower uterine segment and cervix favoring endometrial or cervical primary.  Patient was seen by Dr. Marella Shams and underwent endometrial and cervical biopsy as well as biopsy of the retroperitoneal lymph node.  Cervical biopsy was consistent with squamous mucosa and abundant necrotic debris highly suspicious but not definitively diagnostic for malignancy.  FNA of the retroperitoneal lymph node showed poorly differentiated carcinoma with neuroendocrine features.   Comment: The following immunohistochemistry was performed after review of the clinical history and morphology to further characterize the pathologic process. The results are as follows:   A1-14  PanCK                      positive  A1-15  P16                           Positive, patchy A1-16  P53                           Normal/wild-type  A1-17  PAX8                        Focal blush positive  A1-18  ER Dx Only               negative  A1-19  Bio Neg Ctrl              Appropriately reactive control slide  A1-22  CK7                          Positive, patchy A1-23  CK20                        negative  A1-24  GATA 3                    negative  A1-25  CDX-2                      Positive, patchy A1-26  TTF-1                        negative  A1-27  INSM-1                     Positive, patchy weak A1-28SynaptophysinPositive, patchy weak A1-31  BRG-1  Favor retained (weak) nuclear staining A1-32  BAF 47                     Retained nuclear staining A1-33  P63                           negative  A1-34  Claudin-4                  Positive, patchy    Smears and cell block show a poorly differentiated carcinoma. The immunophenotype is non-specific, but shows evidence of neuroendocrine differentiation.  Plan is for 4-6 cycles of carbo etoposide  chemotherapy if tolerated.    Interval history-she has ongoing fatigue but is otherwise tolerating chemotherapy well.  She has lost considerable weight during chemotherapy and is down to 190 pounds as compared to 204 pounds 2 weeks ago.  Abdominal pain is presently well-controlled  ECOG PS- 2 Pain scale- 0   Review of systems- Review of Systems  Constitutional:  Positive for malaise/fatigue and weight loss. Negative for chills and fever.  HENT:  Negative for congestion, ear discharge and nosebleeds.   Eyes:  Negative for blurred vision.  Respiratory:  Negative for cough, hemoptysis, sputum production, shortness of breath and wheezing.   Cardiovascular:  Negative for chest pain, palpitations, orthopnea and claudication.  Gastrointestinal:  Negative for abdominal pain, blood in stool, constipation, diarrhea, heartburn, melena, nausea and vomiting.  Genitourinary:  Negative for dysuria, flank pain, frequency, hematuria and urgency.  Musculoskeletal:  Negative for back pain, joint pain and myalgias.  Skin:  Negative for rash.  Neurological:  Negative for  dizziness, tingling, focal weakness, seizures, weakness and headaches.  Endo/Heme/Allergies:  Does not bruise/bleed easily.  Psychiatric/Behavioral:  Negative for depression and suicidal ideas. The patient does not have insomnia.       No Known Allergies   Past Medical History:  Diagnosis Date   Cancer (HCC)    Coronary artery disease    GERD (gastroesophageal reflux disease)    Hypertension    Hypothyroidism    Myocardial infarction (HCC)    Thyroid  disease      Past Surgical History:  Procedure Laterality Date   CARDIAC CATHETERIZATION     CESAREAN SECTION     CORONARY STENT INTERVENTION N/A 07/24/2019   Procedure: CORONARY STENT INTERVENTION;  Surgeon: Sammy Crisp, MD;  Location: [facility] INVASIVE CV LAB;  Service: Cardiovascular;  Laterality: N/A;  RCA & CFX   IR IMAGING GUIDED PORT INSERTION  09/23/2023   LEFT HEART CATH AND CORONARY ANGIOGRAPHY N/A 07/24/2019   Procedure: LEFT HEART CATH AND CORONARY ANGIOGRAPHY;  Surgeon: Michelle Aid, MD;  Location: [facility] INVASIVE CV LAB;  Service: Cardiovascular;  Laterality: N/A;   TONSILLECTOMY      Social History   Socioeconomic History   Marital status: Married    Spouse name: Shamirah Ivan   Number of children: 1   Years of education: Not on file   Highest education level: Not on file  Occupational History   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[employer])  Tobacco Use   Smoking status: Never   Smokeless tobacco: Never  Vaping Use   Vaping status: Never Used  Substance and Sexual Activity   Alcohol use: Not Currently    Comment: occ   Drug use: Never   Sexual activity: Not Currently  Other Topics Concern   Not on file  Social History Narrative   Not on file  Social Drivers of Corporate investment banker Strain: Low Risk  (07/23/2019)   Overall Financial Resource Strain (CARDIA)    Difficulty of Paying Living Expenses: Not hard at all  Food Insecurity: No Food Insecurity (11/05/2023)   Hunger Vital Sign    Worried  About Running Out of Food in the Last Year: Never true    Ran Out of Food in the Last Year: Never true  Transportation Needs: No Transportation Needs (11/05/2023)   PRAPARE - Administrator, Civil Service (Medical): No    Lack of Transportation (Non-Medical): No  Physical Activity: Unknown (07/23/2019)   Exercise Vital Sign    Days of Exercise per Week: 2 days    Minutes of Exercise per Session: Not on file  Stress: No Stress Concern Present (07/23/2019)   [employer] of Occupational Health - Occupational Stress Questionnaire    Feeling of Stress : Not at all  Social Connections: Moderately Integrated (10/11/2023)   Social Connection and Isolation Panel [NHANES]    Frequency of Communication with Friends and Family: More than three times a week    Frequency of Social Gatherings with Friends and Family: Twice a week    Attends Religious Services: 1 to 4 times per year    Active Member of [employer] or Organizations: No    Attends Banker Meetings: Never    Marital Status: Married  Catering manager Violence: Not At Risk (11/05/2023)   Humiliation, Afraid, Rape, and Kick questionnaire    Fear of Current or Ex-Partner: No    Emotionally Abused: No    Physically Abused: No    Sexually Abused: No    Family History  Problem Relation Age of Onset   Atrial fibrillation Mother        Passed January 2024.   Colon cancer Mother        Diagnosed 2007   Heart Problems Father    Heart attack Father    Heart disease Father      Current Outpatient Medications:    losartan -hydrochlorothiazide (HYZAAR) 50-12.5 MG tablet, Take 1 tablet by mouth daily., Disp: , Rfl:    Nutritional Supplements (ENSURE HIGH PROTEIN) LIQD, 237 mL 2 TIMES DAILY (route: oral), Disp: , Rfl:    Cholecalciferol (VITAMIN D-1000 MAX ST) 25 MCG (1000 UT) tablet, Take 1,000 Units by mouth daily., Disp: , Rfl:    lidocaine -prilocaine  (EMLA ) cream, Apply to affected area once, Disp: 30 g, Rfl: 3    metoprolol  tartrate (LOPRESSOR ) 25 MG tablet, Take 0.5 tablets (12.5 mg total) by mouth 2 (two) times daily., Disp: 15 tablet, Rfl: 0   Multiple Vitamin (MULTI-VITAMIN) tablet, Take 1 tablet by mouth daily., Disp: , Rfl:    nystatin  cream (MYCOSTATIN ), Apply topically 2 (two) times daily., Disp: 30 g, Rfl: 0   ondansetron  (ZOFRAN ) 8 MG tablet, Take 1 tablet (8 mg total) by mouth every 8 (eight) hours as needed for nausea, vomiting or refractory nausea / vomiting. Start on the third day after carboplatin ., Disp: 30 tablet, Rfl: 1   potassium chloride  (KLOR-CON  M) 10 MEQ tablet, Take 2 tablets (20 mEq total) by mouth 2 (two) times daily., Disp: 120 tablet, Rfl: 1   prochlorperazine  (COMPAZINE ) 10 MG tablet, Take 1 tablet (10 mg total) by mouth every 6 (six) hours as needed for nausea or vomiting., Disp: 30 tablet, Rfl: 1   sodium chloride  1 g tablet, Take 1 tablet (1 g total) by mouth 2 (two) times daily with a meal.,  Disp: 60 tablet, Rfl: 1   thyroid  (ARMOUR) 120 MG tablet, Take 120 mg by mouth daily before breakfast., Disp: , Rfl:    Vitamin D, Ergocalciferol, (DRISDOL) 1.25 MG (50000 UNIT) CAPS capsule, Take 50,000 Units by mouth once a week., Disp: , Rfl:  No current facility-administered medications for this visit.  Facility-Administered Medications Ordered in Other Visits:    0.9 %  sodium chloride  infusion, , Intravenous, Continuous, Avonne Boettcher, MD, Last Rate: 10 mL/hr at 12/07/23 1421, New Bag at 12/07/23 1421   0.9 %  sodium chloride  infusion, , Intravenous, Continuous, Avonne Boettcher, MD, Last Rate: 10 mL/hr at 01/23/24 1019, New Bag at 01/23/24 1019   etoposide  (VEPESID ) 200 mg in sodium chloride  0.9 % 500 mL chemo infusion, 100 mg/m2 (Treatment Plan Recorded), Intravenous, Once, Avonne Boettcher, MD, Last Rate: 510 mL/hr at 01/23/24 1203, 200 mg at 01/23/24 1203   heparin  lock flush 100 unit/mL, 500 Units, Intracatheter, Once PRN, Avonne Boettcher, MD  Physical exam:  Vitals:   01/23/24  0949  BP: 134/89  Pulse: 94  Resp: 18  Temp: 98.8 F (37.1 C)  TempSrc: Tympanic  SpO2: 99%  Weight: 190 lb 3.2 oz (86.3 kg)  Height: 5\' 4"  (1.626 m)   Physical Exam Constitutional:      Comments:   Sitting in a wheelchair.  Appears fatigued  Cardiovascular:     Rate and Rhythm: Normal rate and regular rhythm.     Heart sounds: Normal heart sounds.  Pulmonary:     Effort: Pulmonary effort is normal.     Breath sounds: Normal breath sounds.  Abdominal:     General: Bowel sounds are normal.     Palpations: Abdomen is soft.  Skin:    General: Skin is warm and dry.  Neurological:     Mental Status: She is alert and oriented to person, place, and time.      I have personally reviewed labs listed below:    Latest Ref Rng & Units 01/23/2024    9:19 AM  CMP  Glucose 70 - 99 mg/dL 960   BUN 6 - 20 mg/dL 13   Creatinine 4.54 - 1.00 mg/dL 0.98   Sodium 119 - 147 mmol/L 135   Potassium 3.5 - 5.1 mmol/L 3.2   Chloride 98 - 111 mmol/L 97   CO2 22 - 32 mmol/L 24   Calcium  8.9 - 10.3 mg/dL 9.3   Total Protein 6.5 - 8.1 g/dL 7.5   Total Bilirubin 0.0 - 1.2 mg/dL 1.0   Alkaline Phos 38 - 126 U/L 49   AST 15 - 41 U/L 23   ALT 0 - 44 U/L 11       Latest Ref Rng & Units 01/23/2024    9:19 AM  CBC  WBC 4.0 - 10.5 K/uL 5.3   Hemoglobin 12.0 - 15.0 g/dL 9.6   Hematocrit 82.9 - 46.0 % 29.4   Platelets 150 - 400 K/uL 367      Assessment and plan- Patient is a 59 y.o. 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  She is here for on treatment assessment prior to cycle 6 of carbo etoposide  chemotherapy  Counts okay to proceed with cycle 6 of carbo etoposide  chemotherapy today.  She has been getting a dose reduced carboplatin  with an AUC of 4.  She also gets growth factor support with every cycle.  This will be her last chemotherapy for  now.  I will plan to repeat CT chest abdomen and pelvis with contrast in about  2 weeks from now and I will see her back 1 week after CT scans.  She will also be seen by NP Kenney Peacemaker in about 2 weeks with repeat labs and for possible blood transfusion.  Based on the results of CT scan I will also arrange her visit with GYN oncology Dr. Derotha Fletcher   Visit Diagnosis 1. Encounter for antineoplastic chemotherapy   2. 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 Seretha Dance, MD, MPH [facility] [phone] 01/23/2024 12:25 PM

## 2024-01-24 ENCOUNTER — Other Ambulatory Visit: Payer: Self-pay

## 2024-01-24 ENCOUNTER — Inpatient Hospital Stay

## 2024-01-24 VITALS — BP 147/79 | HR 74 | Temp 97.7°F | Resp 16

## 2024-01-24 DIAGNOSIS — Z5111 Encounter for antineoplastic chemotherapy: Secondary | ICD-10-CM | POA: Diagnosis not present

## 2024-01-24 DIAGNOSIS — C7B8 Other secondary neuroendocrine tumors: Secondary | ICD-10-CM

## 2024-01-24 MED ORDER — DEXAMETHASONE SODIUM PHOSPHATE 10 MG/ML IJ SOLN
10.0000 mg | Freq: Once | INTRAMUSCULAR | Status: AC
  Administered 2024-01-24: 10 mg via INTRAVENOUS
  Filled 2024-01-24: qty 1

## 2024-01-24 MED ORDER — HEPARIN SOD (PORK) LOCK FLUSH 100 UNIT/ML IV SOLN
500.0000 [IU] | Freq: Once | INTRAVENOUS | Status: AC | PRN
  Administered 2024-01-24: 500 [IU]
  Filled 2024-01-24: qty 5

## 2024-01-24 MED ORDER — SODIUM CHLORIDE 0.9 % IV SOLN
INTRAVENOUS | Status: DC
  Filled 2024-01-24: qty 250

## 2024-01-24 MED ORDER — SODIUM CHLORIDE 0.9% FLUSH
10.0000 mL | INTRAVENOUS | Status: DC | PRN
  Administered 2024-01-24: 10 mL
  Filled 2024-01-24: qty 10

## 2024-01-24 MED ORDER — SODIUM CHLORIDE 0.9 % IV SOLN
100.0000 mg/m2 | Freq: Once | INTRAVENOUS | Status: AC
  Administered 2024-01-24: 200 mg via INTRAVENOUS
  Filled 2024-01-24: qty 10

## 2024-01-24 NOTE — Patient Instructions (Signed)
 [facility] - A DEPT OF [facility]  Discharge Instructions: Thank you for choosing [facility] to provide your oncology and hematology care.  If you have a lab appointment with the Cancer Center, please go directly to the Cancer Center and check in at the registration area.  Wear comfortable clothing and clothing appropriate for easy access to any Portacath or PICC line.   We strive to give you quality time with your provider. You may need to reschedule your appointment if you arrive late (15 or more minutes).  Arriving late affects you and other patients whose appointments are after yours.  Also, if you miss three or more appointments without notifying the office, you may be dismissed from the clinic at the provider's discretion.      For prescription refill requests, have your pharmacy contact our office and allow 72 hours for refills to be completed.    Today you received the following chemotherapy and/or immunotherapy agents- etoposide      To help prevent nausea and vomiting after your treatment, we encourage you to take your nausea medication as directed.  BELOW ARE SYMPTOMS THAT SHOULD BE REPORTED IMMEDIATELY: *FEVER GREATER THAN 100.4 F (38 C) OR HIGHER *CHILLS OR SWEATING *NAUSEA AND VOMITING THAT IS NOT CONTROLLED WITH YOUR NAUSEA MEDICATION *UNUSUAL SHORTNESS OF BREATH *UNUSUAL BRUISING OR BLEEDING *URINARY PROBLEMS (pain or burning when urinating, or frequent urination) *BOWEL PROBLEMS (unusual diarrhea, constipation, pain near the anus) TENDERNESS IN MOUTH AND THROAT WITH OR WITHOUT PRESENCE OF ULCERS (sore throat, sores in mouth, or a toothache) UNUSUAL RASH, SWELLING OR PAIN  UNUSUAL VAGINAL DISCHARGE OR ITCHING   Items with * indicate a potential emergency and should be followed up as soon as possible or go to the Emergency Department if any problems should occur.  Please show the CHEMOTHERAPY ALERT CARD or IMMUNOTHERAPY  ALERT CARD at check-in to the Emergency Department and triage nurse.  Should you have questions after your visit or need to cancel or reschedule your appointment, please contact [facility] - A DEPT OF [facility]  [phone] and follow the prompts.  Office hours are 8:00 a.m. to 4:30 p.m. Monday - Friday. Please note that voicemails left after 4:00 p.m. may not be returned until the following business day.  We are closed weekends and major holidays. You have access to a nurse at all times for urgent questions. Please call the main number to the clinic [phone] and follow the prompts.  For any non-urgent questions, you may also contact your provider using MyChart. We now offer e-Visits for anyone 29 and older to request care online for non-urgent symptoms. For details visit [email or web address].   Also download the MyChart app! Go to the app store, search "MyChart", open the app, select [place], and log in with your MyChart username and password.

## 2024-01-25 ENCOUNTER — Inpatient Hospital Stay

## 2024-01-25 VITALS — BP 148/77 | HR 79 | Temp 97.1°F | Resp 18

## 2024-01-25 DIAGNOSIS — C7A8 Other malignant neuroendocrine tumors: Secondary | ICD-10-CM

## 2024-01-25 DIAGNOSIS — Z5111 Encounter for antineoplastic chemotherapy: Secondary | ICD-10-CM | POA: Diagnosis not present

## 2024-01-25 MED ORDER — SODIUM CHLORIDE 0.9% FLUSH
10.0000 mL | INTRAVENOUS | Status: DC | PRN
  Administered 2024-01-25: 10 mL
  Filled 2024-01-25: qty 10

## 2024-01-25 MED ORDER — SODIUM CHLORIDE 0.9 % IV SOLN
INTRAVENOUS | Status: DC
  Filled 2024-01-25: qty 250

## 2024-01-25 MED ORDER — SODIUM CHLORIDE 0.9 % IV SOLN
100.0000 mg/m2 | Freq: Once | INTRAVENOUS | Status: AC
  Administered 2024-01-25: 200 mg via INTRAVENOUS
  Filled 2024-01-25: qty 10

## 2024-01-25 MED ORDER — DEXAMETHASONE SODIUM PHOSPHATE 10 MG/ML IJ SOLN
10.0000 mg | Freq: Once | INTRAMUSCULAR | Status: AC
  Administered 2024-01-25: 10 mg via INTRAVENOUS
  Filled 2024-01-25: qty 1

## 2024-01-25 MED ORDER — HEPARIN SOD (PORK) LOCK FLUSH 100 UNIT/ML IV SOLN
500.0000 [IU] | Freq: Once | INTRAVENOUS | Status: AC | PRN
  Administered 2024-01-25: 500 [IU]
  Filled 2024-01-25: qty 5

## 2024-01-27 ENCOUNTER — Inpatient Hospital Stay

## 2024-01-27 VITALS — BP 143/86 | HR 89 | Temp 97.6°F

## 2024-01-27 DIAGNOSIS — C7B8 Other secondary neuroendocrine tumors: Secondary | ICD-10-CM

## 2024-01-27 DIAGNOSIS — Z5111 Encounter for antineoplastic chemotherapy: Secondary | ICD-10-CM | POA: Diagnosis not present

## 2024-01-27 MED ORDER — PEGFILGRASTIM-JMDB 6 MG/0.6ML ~~LOC~~ SOSY
6.0000 mg | PREFILLED_SYRINGE | Freq: Once | SUBCUTANEOUS | Status: AC
  Administered 2024-01-27: 6 mg via SUBCUTANEOUS
  Filled 2024-01-27: qty 0.6

## 2024-02-06 ENCOUNTER — Encounter: Payer: Self-pay | Admitting: Nurse Practitioner

## 2024-02-06 ENCOUNTER — Inpatient Hospital Stay

## 2024-02-06 ENCOUNTER — Inpatient Hospital Stay (HOSPITAL_BASED_OUTPATIENT_CLINIC_OR_DEPARTMENT_OTHER): Admitting: Nurse Practitioner

## 2024-02-06 VITALS — BP 176/106 | HR 97 | Temp 98.4°F | Resp 17 | Wt 182.0 lb

## 2024-02-06 DIAGNOSIS — C7A8 Other malignant neuroendocrine tumors: Secondary | ICD-10-CM

## 2024-02-06 DIAGNOSIS — C541 Malignant neoplasm of endometrium: Secondary | ICD-10-CM

## 2024-02-06 DIAGNOSIS — D649 Anemia, unspecified: Secondary | ICD-10-CM

## 2024-02-06 DIAGNOSIS — Z5111 Encounter for antineoplastic chemotherapy: Secondary | ICD-10-CM | POA: Diagnosis not present

## 2024-02-06 LAB — CBC WITH DIFFERENTIAL (CANCER CENTER ONLY)
Abs Immature Granulocytes: 0.35 10*3/uL — ABNORMAL HIGH (ref 0.00–0.07)
Basophils Absolute: 0 10*3/uL (ref 0.0–0.1)
Basophils Relative: 0 %
Eosinophils Absolute: 0 10*3/uL (ref 0.0–0.5)
Eosinophils Relative: 1 %
HCT: 25.7 % — ABNORMAL LOW (ref 36.0–46.0)
Hemoglobin: 8.8 g/dL — ABNORMAL LOW (ref 12.0–15.0)
Immature Granulocytes: 4 %
Lymphocytes Relative: 21 %
Lymphs Abs: 1.7 10*3/uL (ref 0.7–4.0)
MCH: 33.2 pg (ref 26.0–34.0)
MCHC: 34.2 g/dL (ref 30.0–36.0)
MCV: 97 fL (ref 80.0–100.0)
Monocytes Absolute: 1.3 10*3/uL — ABNORMAL HIGH (ref 0.1–1.0)
Monocytes Relative: 16 %
Neutro Abs: 4.7 10*3/uL (ref 1.7–7.7)
Neutrophils Relative %: 58 %
Platelet Count: 81 10*3/uL — ABNORMAL LOW (ref 150–400)
RBC: 2.65 MIL/uL — ABNORMAL LOW (ref 3.87–5.11)
RDW: 17.4 % — ABNORMAL HIGH (ref 11.5–15.5)
Smear Review: NORMAL
WBC Count: 8 10*3/uL (ref 4.0–10.5)
nRBC: 0.4 % — ABNORMAL HIGH (ref 0.0–0.2)

## 2024-02-06 LAB — BASIC METABOLIC PANEL - CANCER CENTER ONLY
Anion gap: 14 (ref 5–15)
BUN: 18 mg/dL (ref 6–20)
CO2: 21 mmol/L — ABNORMAL LOW (ref 22–32)
Calcium: 9.4 mg/dL (ref 8.9–10.3)
Chloride: 95 mmol/L — ABNORMAL LOW (ref 98–111)
Creatinine: 0.66 mg/dL (ref 0.44–1.00)
GFR, Estimated: >60 mL/min (ref >60)
Glucose, Bld: 105 mg/dL — ABNORMAL HIGH (ref 70–99)
Potassium: 3.2 mmol/L — ABNORMAL LOW (ref 3.5–5.1)
Sodium: 130 mmol/L — ABNORMAL LOW (ref 135–145)

## 2024-02-06 LAB — SAMPLE TO BLOOD BANK

## 2024-02-06 MED ORDER — HEPARIN SOD (PORK) LOCK FLUSH 100 UNIT/ML IV SOLN
500.0000 [IU] | Freq: Once | INTRAVENOUS | Status: AC
  Administered 2024-02-06: 500 [IU] via INTRAVENOUS
  Filled 2024-02-06: qty 5

## 2024-02-06 NOTE — Progress Notes (Signed)
Patient here for oncology follow-up appointment,  concerns of fatigue     °

## 2024-02-06 NOTE — Progress Notes (Signed)
 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  This was followed by a CT abdomen pelvis with contrast which showed widespread metastatic disease to abdominopelvic and thoracic nodes, omentum and lungs.  Abnormal appearance of the uterus and lower uterine segment and cervix favoring endometrial or cervical primary.  Patient was seen by Dr. Marella Shams and underwent endometrial and cervical biopsy as well as biopsy of the retroperitoneal lymph node.  Cervical biopsy was consistent with squamous mucosa and abundant necrotic debris highly suspicious but not definitively diagnostic for malignancy.  FNA of the retroperitoneal lymph node showed poorly differentiated carcinoma with neuroendocrine features.   Comment: The following immunohistochemistry was performed after review of the clinical history and morphology to further characterize the pathologic process. The results are as follows:   A1-14  PanCK                      positive  A1-15  P16                           Positive, patchy A1-16  P53                           Normal/wild-type  A1-17  PAX8                        Focal blush positive  A1-18  ER Dx Only              negative  A1-19  Bio Neg Ctrl              Appropriately reactive  control slide  A1-22  CK7                          Positive, patchy A1-23  CK20                        negative  A1-24  GATA 3                    negative  A1-25  CDX-2                      Positive, patchy A1-26  TTF-1                        negative  A1-27  INSM-1                     Positive, patchy weak A1-28SynaptophysinPositive, patchy weak A1-31  BRG-1                      Favor retained (weak) nuclear staining  A1-32  BAF 47                     Retained nuclear staining A1-33  P63                           negative  A1-34  Claudin-4                  Positive, patchy    Smears and cell block show a poorly differentiated carcinoma. The immunophenotype is non-specific, but shows evidence of neuroendocrine differentiation.  Plan is for 4-6 cycles of carbo etoposide  chemotherapy if tolerated.    Interval history- Tolerated chemo well. Weakness is stable. Weight down. Has not been taking her BP medication. Has several questions about need for gyn onc follow up.   ECOG PS- 2 Pain scale- 0  Review of systems- Review of Systems  Constitutional:  Positive for malaise/fatigue and weight loss. Negative for chills and fever.  HENT:  Negative for congestion, ear discharge and nosebleeds.   Eyes:  Negative for blurred vision.  Respiratory:  Negative for cough, hemoptysis, sputum production, shortness of breath and wheezing.   Cardiovascular:  Negative for chest pain, palpitations, orthopnea and claudication.  Gastrointestinal:  Negative for abdominal pain, blood in stool, constipation, diarrhea, heartburn, melena, nausea and vomiting.  Genitourinary:  Negative for dysuria, flank pain, frequency, hematuria and urgency.  Musculoskeletal:  Negative for back pain, joint pain and myalgias.  Skin:  Negative for rash.  Neurological:  Negative for dizziness, tingling, focal weakness, seizures, weakness and headaches.  Endo/Heme/Allergies:  Does not bruise/bleed easily.  Psychiatric/Behavioral:   Negative for depression and suicidal ideas. The patient does not have insomnia.     No Known Allergies  Past Medical History:  Diagnosis Date   Cancer (HCC)    Coronary artery disease    GERD (gastroesophageal reflux disease)    Hypertension    Hypothyroidism    Myocardial infarction (HCC)    Thyroid  disease    Past Surgical History:  Procedure Laterality Date   CARDIAC CATHETERIZATION     CESAREAN SECTION     CORONARY STENT INTERVENTION N/A 07/24/2019   Procedure: CORONARY STENT INTERVENTION;  Surgeon: Sammy Crisp, MD;  Location: [facility] INVASIVE CV LAB;  Service: Cardiovascular;  Laterality: N/A;  RCA & CFX   IR IMAGING GUIDED PORT INSERTION  09/23/2023   LEFT HEART CATH AND CORONARY ANGIOGRAPHY N/A 07/24/2019   Procedure: LEFT HEART CATH AND CORONARY ANGIOGRAPHY;  Surgeon: Michelle Aid, MD;  Location: [facility] INVASIVE CV LAB;  Service: Cardiovascular;  Laterality: N/A;   TONSILLECTOMY     Social History   Socioeconomic History   Marital status: Married    Spouse name: Melyssa Signor   Number of children: 1   Years of education: Not on file   Highest education level: Not on file  Occupational History   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[employer])  Tobacco Use   Smoking status: Never   Smokeless tobacco: Never  Vaping Use   Vaping status: Never Used  Substance and Sexual Activity   Alcohol use: Not Currently    Comment: occ   Drug use: Never   Sexual activity: Not Currently  Other Topics Concern   Not on file  Social History Narrative   Not on file   Social Drivers of Health   Financial Resource Strain: Low Risk  (07/23/2019)   Overall Financial Resource Strain (CARDIA)  Difficulty of Paying Living Expenses: Not hard at all  Food Insecurity: No Food Insecurity (11/05/2023)   Hunger Vital Sign    Worried About Running Out of Food in the Last Year: Never true    Ran Out of Food in the Last Year: Never true  Transportation Needs: No Transportation Needs (11/05/2023)    PRAPARE - Administrator, Civil Service (Medical): No    Lack of Transportation (Non-Medical): No  Physical Activity: Unknown (07/23/2019)   Exercise Vital Sign    Days of Exercise per Week: 2 days    Minutes of Exercise per Session: Not on file  Stress: No Stress Concern Present (07/23/2019)   [employer] of Occupational Health - Occupational Stress Questionnaire    Feeling of Stress : Not at all  Social Connections: Moderately Integrated (10/11/2023)   Social Connection and Isolation Panel    Frequency of Communication with Friends and Family: More than three times a week    Frequency of Social Gatherings with Friends and Family: Twice a week    Attends Religious Services: 1 to 4 times per year    Active Member of [employer] or Organizations: No    Attends Banker Meetings: Never    Marital Status: Married  Catering manager Violence: Not At Risk (11/05/2023)   Humiliation, Afraid, Rape, and Kick questionnaire    Fear of Current or Ex-Partner: No    Emotionally Abused: No    Physically Abused: No    Sexually Abused: No   Family History  Problem Relation Age of Onset   Atrial fibrillation Mother        Passed January 2024.   Colon cancer Mother        Diagnosed 2007   Heart Problems Father    Heart attack Father    Heart disease Father     Current Outpatient Medications:    Cholecalciferol (VITAMIN D-1000 MAX ST) 25 MCG (1000 UT) tablet, Take 1,000 Units by mouth daily., Disp: , Rfl:    lidocaine -prilocaine  (EMLA ) cream, Apply to affected area once, Disp: 30 g, Rfl: 3   losartan -hydrochlorothiazide (HYZAAR) 50-12.5 MG tablet, Take 1 tablet by mouth daily., Disp: , Rfl:    Multiple Vitamin (MULTI-VITAMIN) tablet, Take 1 tablet by mouth daily., Disp: , Rfl:    Nutritional Supplements (ENSURE HIGH PROTEIN) LIQD, 237 mL 2 TIMES DAILY (route: oral), Disp: , Rfl:    nystatin  cream (MYCOSTATIN ), Apply topically 2 (two) times daily., Disp: 30 g, Rfl: 0    ondansetron  (ZOFRAN ) 8 MG tablet, Take 1 tablet (8 mg total) by mouth every 8 (eight) hours as needed for nausea, vomiting or refractory nausea / vomiting. Start on the third day after carboplatin ., Disp: 30 tablet, Rfl: 1   potassium chloride  (KLOR-CON  M) 10 MEQ tablet, Take 2 tablets (20 mEq total) by mouth 2 (two) times daily., Disp: 120 tablet, Rfl: 1   prochlorperazine  (COMPAZINE ) 10 MG tablet, Take 1 tablet (10 mg total) by mouth every 6 (six) hours as needed for nausea or vomiting., Disp: 30 tablet, Rfl: 1   sodium chloride  1 g tablet, Take 1 tablet (1 g total) by mouth 2 (two) times daily with a meal., Disp: 60 tablet, Rfl: 1   thyroid  (ARMOUR) 120 MG tablet, Take 120 mg by mouth daily before breakfast., Disp: , Rfl:    metoprolol  tartrate (LOPRESSOR ) 25 MG tablet, Take 0.5 tablets (12.5 mg total) by mouth 2 (two) times daily. (Patient not taking: Reported on 02/06/2024), Disp:  15 tablet, Rfl: 0   Vitamin D, Ergocalciferol, (DRISDOL) 1.25 MG (50000 UNIT) CAPS capsule, Take 50,000 Units by mouth once a week. (Patient not taking: Reported on 02/06/2024), Disp: , Rfl:   Current Facility-Administered Medications:    heparin  lock flush 100 unit/mL, 500 Units, Intravenous, Once, Nelda Balsam, NP  Facility-Administered Medications Ordered in Other Visits:    0.9 %  sodium chloride  infusion, , Intravenous, Continuous, Avonne Boettcher, MD, Last Rate: 10 mL/hr at 12/07/23 1421, New Bag at 12/07/23 1421  Physical exam:  Vitals:   02/06/24 1553 02/06/24 1557  BP: (!) 160/93 (!) 176/106  Pulse: 99 97  Resp: 17   Temp: 98.4 F (36.9 C)   TempSrc: Tympanic   SpO2: 99%   Weight: 182 lb (82.6 kg)    Physical Exam Vitals reviewed.  Constitutional:      Appearance: She is not ill-appearing.     Comments: Sitting in a wheelchair. Fatigued.    Cardiovascular:     Rate and Rhythm: Normal rate and regular rhythm.  Pulmonary:     Effort: Pulmonary effort is normal.     Breath sounds: Normal  breath sounds.  Abdominal:     General: Bowel sounds are normal. There is no distension.     Palpations: Abdomen is soft.   Skin:    General: Skin is warm and dry.     Coloration: Skin is not pale.   Neurological:     Mental Status: She is alert and oriented to person, place, and time.   Psychiatric:        Mood and Affect: Mood normal.        Behavior: Behavior normal.   I have personally reviewed labs listed below:    Latest Ref Rng & Units 02/06/2024    3:29 PM  CMP  Glucose 70 - 99 mg/dL 161   BUN 6 - 20 mg/dL 18   Creatinine 0.96 - 1.00 mg/dL 0.45   Sodium 409 - 811 mmol/L 130   Potassium 3.5 - 5.1 mmol/L 3.2   Chloride 98 - 111 mmol/L 95   CO2 22 - 32 mmol/L 21   Calcium  8.9 - 10.3 mg/dL 9.4       Latest Ref Rng & Units 02/06/2024    3:29 PM  CBC  WBC 4.0 - 10.5 K/uL 8.0   Hemoglobin 12.0 - 15.0 g/dL 8.8   Hematocrit 91.4 - 46.0 % 25.7   Platelets 150 - 400 K/uL 81    Assessment and plan- Patient is a 58 y.o. 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 S/p cycle 6 of carbo-etoposide  chemotherapy. She has imaging with Dr Randy Buttery planned. Will refer her back to gyn onc for discussion of potential debulking surgery.  Symptomatic anemia- Hmg 8.8. Hold transfusion. Expect counts to improve now that she has completed chemo. If symptomatic, plan to add lab visit sooner.  Hypertension- recommend compliance with medications. Discussed options for timing of dosage to minimize side effects.   Disposition:  Cancel blood tomorrow Add gyn onc appt on 7/9  in afternoon for discussion of debulking surgery- la   Visit Diagnosis 1. Symptomatic anemia   2. Endometrial carcinoma (HCC)    Kenney Peacemaker, DNP, AGNP-C, AOCNP [facility] [phone] (clinic) 02/06/2024

## 2024-02-07 ENCOUNTER — Inpatient Hospital Stay

## 2024-02-07 ENCOUNTER — Other Ambulatory Visit: Payer: Self-pay

## 2024-02-10 ENCOUNTER — Other Ambulatory Visit: Payer: Self-pay | Admitting: *Deleted

## 2024-02-10 ENCOUNTER — Telehealth: Payer: Self-pay | Admitting: *Deleted

## 2024-02-10 DIAGNOSIS — C541 Malignant neoplasm of endometrium: Secondary | ICD-10-CM

## 2024-02-10 DIAGNOSIS — C7A8 Other malignant neuroendocrine tumors: Secondary | ICD-10-CM

## 2024-02-10 NOTE — Telephone Encounter (Signed)
 Her knees are weak and she gets lightheaded when she gets up and around.  And she feels like she needs have some labs to see if the numbers go something that they can help her with to be feeling better.  She would like to come in on Monday around 3:15 if possible I called the patient back and asked her if she can come to 30 because if any of her labs are low and we have to give her some IV fluids or something else it takes more time and so 230 would be the best.  She says that that is fine and I told her to go ahead and put her cream on before she comes.

## 2024-02-13 ENCOUNTER — Other Ambulatory Visit: Payer: Self-pay

## 2024-02-13 ENCOUNTER — Inpatient Hospital Stay (HOSPITAL_BASED_OUTPATIENT_CLINIC_OR_DEPARTMENT_OTHER): Admitting: Nurse Practitioner

## 2024-02-13 ENCOUNTER — Inpatient Hospital Stay

## 2024-02-13 ENCOUNTER — Encounter: Payer: Self-pay | Admitting: Nurse Practitioner

## 2024-02-13 VITALS — Temp 97.5°F | Resp 16 | Wt 179.0 lb

## 2024-02-13 DIAGNOSIS — N179 Acute kidney failure, unspecified: Secondary | ICD-10-CM

## 2024-02-13 DIAGNOSIS — D649 Anemia, unspecified: Secondary | ICD-10-CM | POA: Diagnosis not present

## 2024-02-13 DIAGNOSIS — Z5189 Encounter for other specified aftercare: Secondary | ICD-10-CM

## 2024-02-13 DIAGNOSIS — C541 Malignant neoplasm of endometrium: Secondary | ICD-10-CM

## 2024-02-13 DIAGNOSIS — Z95828 Presence of other vascular implants and grafts: Secondary | ICD-10-CM

## 2024-02-13 DIAGNOSIS — Z5111 Encounter for antineoplastic chemotherapy: Secondary | ICD-10-CM | POA: Diagnosis not present

## 2024-02-13 DIAGNOSIS — C7A8 Other malignant neuroendocrine tumors: Secondary | ICD-10-CM

## 2024-02-13 DIAGNOSIS — R634 Abnormal weight loss: Secondary | ICD-10-CM | POA: Diagnosis not present

## 2024-02-13 LAB — CBC WITH DIFFERENTIAL (CANCER CENTER ONLY)
Abs Immature Granulocytes: 0.17 10*3/uL — ABNORMAL HIGH (ref 0.00–0.07)
Basophils Absolute: 0.1 10*3/uL (ref 0.0–0.1)
Basophils Relative: 1 %
Eosinophils Absolute: 0 10*3/uL (ref 0.0–0.5)
Eosinophils Relative: 0 %
HCT: 26.4 % — ABNORMAL LOW (ref 36.0–46.0)
Hemoglobin: 9.1 g/dL — ABNORMAL LOW (ref 12.0–15.0)
Immature Granulocytes: 1 %
Lymphocytes Relative: 10 %
Lymphs Abs: 1.2 10*3/uL (ref 0.7–4.0)
MCH: 33.8 pg (ref 26.0–34.0)
MCHC: 34.5 g/dL (ref 30.0–36.0)
MCV: 98.1 fL (ref 80.0–100.0)
Monocytes Absolute: 1.7 10*3/uL — ABNORMAL HIGH (ref 0.1–1.0)
Monocytes Relative: 14 %
Neutro Abs: 9.3 10*3/uL — ABNORMAL HIGH (ref 1.7–7.7)
Neutrophils Relative %: 74 %
Platelet Count: 374 10*3/uL (ref 150–400)
RBC: 2.69 MIL/uL — ABNORMAL LOW (ref 3.87–5.11)
RDW: 18.9 % — ABNORMAL HIGH (ref 11.5–15.5)
WBC Count: 12.5 10*3/uL — ABNORMAL HIGH (ref 4.0–10.5)
nRBC: 0.2 % (ref 0.0–0.2)

## 2024-02-13 LAB — IRON AND TIBC
Iron: 35 ug/dL (ref 28–170)
Saturation Ratios: 15 % (ref 10.4–31.8)
TIBC: 230 ug/dL — ABNORMAL LOW (ref 250–450)
UIBC: 195 ug/dL

## 2024-02-13 LAB — CMP (CANCER CENTER ONLY)
ALT: 11 U/L (ref 0–44)
AST: 21 U/L (ref 15–41)
Albumin: 4 g/dL (ref 3.5–5.0)
Alkaline Phosphatase: 58 U/L (ref 38–126)
Anion gap: 13 (ref 5–15)
BUN: 24 mg/dL — ABNORMAL HIGH (ref 6–20)
CO2: 25 mmol/L (ref 22–32)
Calcium: 9.8 mg/dL (ref 8.9–10.3)
Chloride: 90 mmol/L — ABNORMAL LOW (ref 98–111)
Creatinine: 0.64 mg/dL (ref 0.44–1.00)
GFR, Estimated: >60 mL/min (ref >60)
Glucose, Bld: 116 mg/dL — ABNORMAL HIGH (ref 70–99)
Potassium: 3.1 mmol/L — ABNORMAL LOW (ref 3.5–5.1)
Sodium: 128 mmol/L — ABNORMAL LOW (ref 135–145)
Total Bilirubin: 1 mg/dL (ref 0.0–1.2)
Total Protein: 7.6 g/dL (ref 6.5–8.1)

## 2024-02-13 LAB — MAGNESIUM: Magnesium: 1.6 mg/dL — ABNORMAL LOW (ref 1.7–2.4)

## 2024-02-13 LAB — SAMPLE TO BLOOD BANK

## 2024-02-13 LAB — FERRITIN: Ferritin: 1262 ng/mL — ABNORMAL HIGH (ref 11–307)

## 2024-02-13 MED ORDER — HEPARIN SOD (PORK) LOCK FLUSH 100 UNIT/ML IV SOLN
500.0000 [IU] | Freq: Once | INTRAVENOUS | Status: AC
  Administered 2024-02-13: 500 [IU]
  Filled 2024-02-13: qty 5

## 2024-02-13 NOTE — Progress Notes (Signed)
 Symptom Management Clinic  [facility] A Department of the [facility]. 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 [facility])  09/21/2023 Initial Diagnosis   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 [facility])   09/26/2023 -  Chemotherapy   Patient is on Treatment Plan : neuronedocrine endometrium Carboplatin  + Etoposide      Endometrial carcinoma (HCC)  09/21/2023 Initial Diagnosis   Endometrial carcinoma (HCC)   09/21/2023 Cancer Staging   Staging form: Corpus Uteri - Carcinoma and Carcinosarcoma, AJCC 8th Edition and FIGO 2023 - Clinical stage from 09/21/2023: FIGO Stage IVC, calculated as Stage IVB (cN2a, pM1) - Signed by Melanee Annah BROCKS, MD on 09/21/2023 Histologic grade (G): G3 Histologic grading system: 3 grade system     Interval History- Sayra Frisby is a 58 y.o. female with above history of endometrial cancer who returns to clinic for concerns of symptomatic anemia. She is worried that her hemoglobin has dropped. She is not eating well, has lost weight. Feels nauseous. Endorses taste changes. Fatigued. Denies improvement with zofran  or compazine . Moving her bowels normally.   Review of systems- Review of Systems  Constitutional:  Positive for malaise/fatigue and weight loss. Negative for chills and fever.  HENT:  Negative for hearing loss, nosebleeds, sore throat and tinnitus.   Eyes:  Negative for blurred vision  and double vision.  Respiratory:  Positive for shortness of breath. Negative for cough, hemoptysis and wheezing.   Cardiovascular:  Negative for chest pain, palpitations and leg swelling.  Gastrointestinal:  Positive for heartburn and nausea. Negative for abdominal pain, blood in stool, constipation, diarrhea, melena and vomiting.  Genitourinary:  Negative for dysuria, hematuria and urgency.  Musculoskeletal:  Negative for back pain, falls, joint pain and myalgias.  Skin:  Negative for itching and rash.  Neurological:  Negative for dizziness, tingling, sensory change, loss of consciousness, weakness and headaches.  Endo/Heme/Allergies:  Negative for environmental allergies. Does not bruise/bleed easily.  Psychiatric/Behavioral:  Negative for depression. The patient is not nervous/anxious and does not have insomnia.     No Known Allergies  Past Medical History:  Diagnosis Date   Cancer (HCC)    Coronary artery disease    GERD (gastroesophageal reflux disease)    Hypertension    Hypothyroidism    Myocardial infarction (HCC)    Thyroid  disease    Past Surgical History:  Procedure Laterality Date   CARDIAC CATHETERIZATION     CESAREAN SECTION     CORONARY STENT INTERVENTION N/A 07/24/2019   Procedure: CORONARY STENT INTERVENTION;  Surgeon: Mady Lonni, MD;  Location: [facility] INVASIVE CV LAB;  Service: Cardiovascular;  Laterality: N/A;  RCA & CFX   IR IMAGING GUIDED PORT INSERTION  09/23/2023   LEFT HEART CATH AND CORONARY ANGIOGRAPHY N/A 07/24/2019   Procedure: LEFT HEART CATH AND CORONARY ANGIOGRAPHY;  Surgeon: Hester Wolm PARAS, MD;  Location: [facility] INVASIVE CV LAB;  Service: Cardiovascular;  Laterality: N/A;   TONSILLECTOMY     Social History   Socioeconomic History  Marital status: Married    Spouse name: Marjie Chea   Number of children: 1   Years of education: Not on file   Highest education level: Not on file  Occupational History   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[employer])   Tobacco Use   Smoking status: Never   Smokeless tobacco: Never  Vaping Use   Vaping status: Never Used  Substance and Sexual Activity   Alcohol use: Not Currently    Comment: occ   Drug use: Never   Sexual activity: Not Currently  Other Topics Concern   Not on file  Social History Narrative   Not on file   Social Drivers of Health   Financial Resource Strain: Low Risk  (07/23/2019)   Overall Financial Resource Strain (CARDIA)    Difficulty of Paying Living Expenses: Not hard at all  Food Insecurity: No Food Insecurity (11/05/2023)   Hunger Vital Sign    Worried About Running Out of Food in the Last Year: Never true    Ran Out of Food in the Last Year: Never true  Transportation Needs: No Transportation Needs (11/05/2023)   PRAPARE - Administrator, Civil Service (Medical): No    Lack of Transportation (Non-Medical): No  Physical Activity: Unknown (07/23/2019)   Exercise Vital Sign    Days of Exercise per Week: 2 days    Minutes of Exercise per Session: Not on file  Stress: No Stress Concern Present (07/23/2019)   [employer] of Occupational Health - Occupational Stress Questionnaire    Feeling of Stress : Not at all  Social Connections: Moderately Integrated (10/11/2023)   Social Connection and Isolation Panel    Frequency of Communication with Friends and Family: More than three times a week    Frequency of Social Gatherings with Friends and Family: Twice a week    Attends Religious Services: 1 to 4 times per year    Active Member of [employer] or Organizations: No    Attends Banker Meetings: Never    Marital Status: Married  Catering manager Violence: Not At Risk (11/05/2023)   Humiliation, Afraid, Rape, and Kick questionnaire    Fear of Current or Ex-Partner: No    Emotionally Abused: No    Physically Abused: No    Sexually Abused: No   Family History  Problem Relation Age of Onset   Atrial fibrillation Mother        Passed January 2024.    Colon cancer Mother        Diagnosed 2007   Heart Problems Father    Heart attack Father    Heart disease Father    Current Outpatient Medications:    Cholecalciferol (VITAMIN D-1000 MAX ST) 25 MCG (1000 UT) tablet, Take 1,000 Units by mouth daily., Disp: , Rfl:    lidocaine -prilocaine  (EMLA ) cream, Apply to affected area once, Disp: 30 g, Rfl: 3   losartan -hydrochlorothiazide (HYZAAR) 50-12.5 MG tablet, Take 1 tablet by mouth daily., Disp: , Rfl:    Multiple Vitamin (MULTI-VITAMIN) tablet, Take 1 tablet by mouth daily., Disp: , Rfl:    Nutritional Supplements (ENSURE HIGH PROTEIN) LIQD, 237 mL 2 TIMES DAILY (route: oral), Disp: , Rfl:    nystatin  cream (MYCOSTATIN ), Apply topically 2 (two) times daily., Disp: 30 g, Rfl: 0   ondansetron  (ZOFRAN ) 8 MG tablet, Take 1 tablet (8 mg total) by mouth every 8 (eight) hours as needed for nausea, vomiting or refractory nausea / vomiting. Start on the third day after carboplatin ., Disp:  30 tablet, Rfl: 1   potassium chloride  (KLOR-CON  M) 10 MEQ tablet, Take 2 tablets (20 mEq total) by mouth 2 (two) times daily., Disp: 120 tablet, Rfl: 1   prochlorperazine  (COMPAZINE ) 10 MG tablet, Take 1 tablet (10 mg total) by mouth every 6 (six) hours as needed for nausea or vomiting., Disp: 30 tablet, Rfl: 1   sodium chloride  1 g tablet, Take 1 tablet (1 g total) by mouth 2 (two) times daily with a meal., Disp: 60 tablet, Rfl: 1   thyroid  (ARMOUR) 120 MG tablet, Take 120 mg by mouth daily before breakfast., Disp: , Rfl:    metoprolol  tartrate (LOPRESSOR ) 25 MG tablet, Take 0.5 tablets (12.5 mg total) by mouth 2 (two) times daily. (Patient not taking: Reported on 02/13/2024), Disp: 15 tablet, Rfl: 0   Vitamin D, Ergocalciferol, (DRISDOL) 1.25 MG (50000 UNIT) CAPS capsule, Take 50,000 Units by mouth once a week. (Patient not taking: Reported on 02/13/2024), Disp: , Rfl:  No current facility-administered medications for this visit.  Facility-Administered Medications  Ordered in Other Visits:    0.9 %  sodium chloride  infusion, , Intravenous, Continuous, Melanee Annah BROCKS, MD, Last Rate: 10 mL/hr at 12/07/23 1421, New Bag at 12/07/23 1421  Physical exam:  Vitals:   02/13/24 1507 02/13/24 1515  Resp: 16 16  Temp: (!) 97.5 F (36.4 C) (!) 97.5 F (36.4 C)  TempSrc: Tympanic Tympanic  SpO2: 100% 100%  Weight: 179 lb (81.2 kg) 179 lb (81.2 kg)   Physical Exam Vitals reviewed.  Constitutional:      Appearance: She is not ill-appearing.  Pulmonary:     Effort: Pulmonary effort is normal.  Abdominal:     General: There is no distension.     Tenderness: There is no abdominal tenderness.   Musculoskeletal:        General: No deformity.   Skin:    General: Skin is warm.     Coloration: Skin is pale.   Neurological:     Mental Status: She is alert and oriented to person, place, and time.   Psychiatric:        Mood and Affect: Mood normal.        Behavior: Behavior normal.        Latest Ref Rng & Units 02/13/2024    2:38 PM  CMP  Glucose 70 - 99 mg/dL 883   BUN 6 - 20 mg/dL 24   Creatinine 9.55 - 1.00 mg/dL 9.35   Sodium 864 - 854 mmol/L 128   Potassium 3.5 - 5.1 mmol/L 3.1   Chloride 98 - 111 mmol/L 90   CO2 22 - 32 mmol/L 25   Calcium  8.9 - 10.3 mg/dL 9.8   Total Protein 6.5 - 8.1 g/dL 7.6   Total Bilirubin 0.0 - 1.2 mg/dL 1.0   Alkaline Phos 38 - 126 U/L 58   AST 15 - 41 U/L 21   ALT 0 - 44 U/L 11       Latest Ref Rng & Units 02/13/2024    2:45 PM  CBC  WBC 4.0 - 10.5 K/uL 12.5   Hemoglobin 12.0 - 15.0 g/dL 9.1   Hematocrit 63.9 - 46.0 % 26.4   Platelets 150 - 400 K/uL 374    Iron/TIBC/Ferritin/ %Sat    Component Value Date/Time   IRON 35 02/13/2024 1438   TIBC 230 (L) 02/13/2024 1438   FERRITIN 1,262 (H) 02/13/2024 1438   IRONPCTSAT 15 02/13/2024 1438   No results found.  Assessment  and plan- Patient is a 58 y.o. female   Symptomatic anemia- hmg 9.1 today. Improved. No role for transfusion. Iron studies are  consistent with anemia of chronic diease/chemotherapy.  Weight loss- recommend frequent intake of calorie dense meals.  Leukocytosis- received GCSF on 01/27/24. Monitor.    Visit Diagnosis 1. Endometrial carcinoma [facility])    Patient expressed understanding and was in agreement with this plan. She also understands that She can call clinic at any time with any questions, concerns, or complaints.   Thank you for allowing me to participate in the care of this very pleasant patient.   Tinnie Dawn, DNP, AGNP-C, AOCNP [facility] [phone]  CC:

## 2024-02-14 LAB — THYROID PANEL WITH TSH
Free Thyroxine Index: 1.9 (ref 1.2–4.9)
T3 Uptake Ratio: 25 % (ref 24–39)
T4, Total: 7.5 ug/dL (ref 4.5–12.0)
TSH: 8.01 u[IU]/mL — ABNORMAL HIGH (ref 0.450–4.500)

## 2024-02-17 ENCOUNTER — Ambulatory Visit

## 2024-02-20 ENCOUNTER — Telehealth: Payer: Self-pay

## 2024-02-20 ENCOUNTER — Telehealth: Payer: Self-pay | Admitting: Oncology

## 2024-02-20 ENCOUNTER — Other Ambulatory Visit: Payer: Self-pay | Admitting: Nurse Practitioner

## 2024-02-20 ENCOUNTER — Encounter: Payer: Self-pay | Admitting: Oncology

## 2024-02-20 MED ORDER — PANTOPRAZOLE SODIUM 20 MG PO TBEC: 20.0000 mg | DELAYED_RELEASE_TABLET | Freq: Every day | ORAL | 2 refills | Status: DC

## 2024-02-20 NOTE — Telephone Encounter (Signed)
 Patient calls from concerns of vomiting last 3 days on/off unable to tolerate smells. Really gassy and c/o of discomfort in chest. Unrelieved by gas-x and hasn't tried Zofran  or Compazine  stating she think it will not help. She asks does she need labs/ visit or can we send in anti-acid med? She can not come in until tomorrow due to transportation. [facility] clinic/NP notifed

## 2024-02-20 NOTE — Telephone Encounter (Signed)
 We will need to reschedule her ct after insurance approves it and call her

## 2024-02-20 NOTE — Telephone Encounter (Signed)
 Informed patient NP will send in a PPI and advises to take Compazine  and Zofran . Patient will try need med and call back if no relief to be seen.

## 2024-02-20 NOTE — Progress Notes (Signed)
 Patient reports acid reflux and bilous emesis. No effect with zofran  or compazine  and she has discontinued. She's unable to come to clinic for evaluation today. Recommend adding PPI. Prescription for protonix sent to pharmacy. Can be seen in [facility] tomorrow if she'd like.

## 2024-02-20 NOTE — Telephone Encounter (Signed)
 Pt called and stated she went to get her CT done but when she showed up they told her it was canceled due to insurance not approving it yet. I put pt on hold and had Brooke speak with pt.

## 2024-02-21 ENCOUNTER — Telehealth: Payer: Self-pay

## 2024-02-21 DIAGNOSIS — C541 Malignant neoplasm of endometrium: Secondary | ICD-10-CM

## 2024-02-21 NOTE — Telephone Encounter (Signed)
 Per Tinnie- she can see patient at noon tomorrow. Roxie, RN called patient and she can come tomorrow at noon. Pt will need port labs/smc/possible iv fluids.  Jenn- will you add to schedule

## 2024-02-21 NOTE — Telephone Encounter (Signed)
 Patient left message wanting to know if she could come in tomorrow at 3:15 to get port labs to check on levels that way to know if she needs fluids she can come in on Thursday. she is worried something will happen over the long weekend. She hasn't eaten anything since last Wednesday not drinking a lot of water , only ensure 1 a day due to stomach problems.

## 2024-02-21 NOTE — Telephone Encounter (Signed)
 Lauren or josh can see her tomorrow with labs cbc with diff, bmp and hold tube for possible transfusion

## 2024-02-22 ENCOUNTER — Inpatient Hospital Stay: Attending: Oncology | Admitting: Nurse Practitioner

## 2024-02-22 ENCOUNTER — Other Ambulatory Visit: Payer: Self-pay

## 2024-02-22 ENCOUNTER — Encounter: Payer: Self-pay | Admitting: Nurse Practitioner

## 2024-02-22 ENCOUNTER — Ambulatory Visit

## 2024-02-22 ENCOUNTER — Inpatient Hospital Stay

## 2024-02-22 VITALS — BP 160/89

## 2024-02-22 VITALS — BP 160/100 | HR 104 | Temp 98.2°F | Resp 18 | Ht 64.0 in | Wt 177.6 lb

## 2024-02-22 DIAGNOSIS — R682 Dry mouth, unspecified: Secondary | ICD-10-CM | POA: Insufficient documentation

## 2024-02-22 DIAGNOSIS — R140 Abdominal distension (gaseous): Secondary | ICD-10-CM | POA: Insufficient documentation

## 2024-02-22 DIAGNOSIS — I2510 Atherosclerotic heart disease of native coronary artery without angina pectoris: Secondary | ICD-10-CM | POA: Diagnosis not present

## 2024-02-22 DIAGNOSIS — E039 Hypothyroidism, unspecified: Secondary | ICD-10-CM | POA: Insufficient documentation

## 2024-02-22 DIAGNOSIS — C541 Malignant neoplasm of endometrium: Secondary | ICD-10-CM | POA: Insufficient documentation

## 2024-02-22 DIAGNOSIS — C7A1 Malignant poorly differentiated neuroendocrine tumors: Secondary | ICD-10-CM | POA: Diagnosis present

## 2024-02-22 DIAGNOSIS — Z9089 Acquired absence of other organs: Secondary | ICD-10-CM | POA: Insufficient documentation

## 2024-02-22 DIAGNOSIS — K219 Gastro-esophageal reflux disease without esophagitis: Secondary | ICD-10-CM | POA: Insufficient documentation

## 2024-02-22 DIAGNOSIS — I252 Old myocardial infarction: Secondary | ICD-10-CM | POA: Diagnosis not present

## 2024-02-22 DIAGNOSIS — I10 Essential (primary) hypertension: Secondary | ICD-10-CM | POA: Diagnosis not present

## 2024-02-22 DIAGNOSIS — R109 Unspecified abdominal pain: Secondary | ICD-10-CM | POA: Diagnosis not present

## 2024-02-22 DIAGNOSIS — Z8249 Family history of ischemic heart disease and other diseases of the circulatory system: Secondary | ICD-10-CM | POA: Insufficient documentation

## 2024-02-22 DIAGNOSIS — C7B8 Other secondary neuroendocrine tumors: Secondary | ICD-10-CM | POA: Diagnosis not present

## 2024-02-22 DIAGNOSIS — R6881 Early satiety: Secondary | ICD-10-CM | POA: Diagnosis not present

## 2024-02-22 DIAGNOSIS — Z800 Family history of malignant neoplasm of digestive organs: Secondary | ICD-10-CM | POA: Diagnosis not present

## 2024-02-22 DIAGNOSIS — Z7952 Long term (current) use of systemic steroids: Secondary | ICD-10-CM | POA: Diagnosis not present

## 2024-02-22 DIAGNOSIS — D649 Anemia, unspecified: Secondary | ICD-10-CM

## 2024-02-22 DIAGNOSIS — Z79899 Other long term (current) drug therapy: Secondary | ICD-10-CM | POA: Diagnosis not present

## 2024-02-22 LAB — CBC WITH DIFFERENTIAL (CANCER CENTER ONLY)
Abs Immature Granulocytes: 0.07 10*3/uL (ref 0.00–0.07)
Basophils Absolute: 0.1 10*3/uL (ref 0.0–0.1)
Basophils Relative: 1 %
Eosinophils Absolute: 0 10*3/uL (ref 0.0–0.5)
Eosinophils Relative: 0 %
HCT: 30.5 % — ABNORMAL LOW (ref 36.0–46.0)
Hemoglobin: 10.3 g/dL — ABNORMAL LOW (ref 12.0–15.0)
Immature Granulocytes: 1 %
Lymphocytes Relative: 9 %
Lymphs Abs: 1 10*3/uL (ref 0.7–4.0)
MCH: 33 pg (ref 26.0–34.0)
MCHC: 33.8 g/dL (ref 30.0–36.0)
MCV: 97.8 fL (ref 80.0–100.0)
Monocytes Absolute: 1.5 10*3/uL — ABNORMAL HIGH (ref 0.1–1.0)
Monocytes Relative: 14 %
Neutro Abs: 8.3 10*3/uL — ABNORMAL HIGH (ref 1.7–7.7)
Neutrophils Relative %: 75 %
Platelet Count: 450 10*3/uL — ABNORMAL HIGH (ref 150–400)
RBC: 3.12 MIL/uL — ABNORMAL LOW (ref 3.87–5.11)
RDW: 17.4 % — ABNORMAL HIGH (ref 11.5–15.5)
WBC Count: 10.9 10*3/uL — ABNORMAL HIGH (ref 4.0–10.5)
nRBC: 0 % (ref 0.0–0.2)

## 2024-02-22 LAB — SAMPLE TO BLOOD BANK

## 2024-02-22 LAB — BASIC METABOLIC PANEL - CANCER CENTER ONLY
Anion gap: 16 — ABNORMAL HIGH (ref 5–15)
BUN: 30 mg/dL — ABNORMAL HIGH (ref 6–20)
CO2: 26 mmol/L (ref 22–32)
Calcium: 9.8 mg/dL (ref 8.9–10.3)
Chloride: 86 mmol/L — ABNORMAL LOW (ref 98–111)
Creatinine: 0.72 mg/dL (ref 0.44–1.00)
GFR, Estimated: >60 mL/min (ref >60)
Glucose, Bld: 132 mg/dL — ABNORMAL HIGH (ref 70–99)
Potassium: 3.3 mmol/L — ABNORMAL LOW (ref 3.5–5.1)
Sodium: 128 mmol/L — ABNORMAL LOW (ref 135–145)

## 2024-02-22 MED ORDER — SODIUM CHLORIDE 0.9 % IV SOLN
INTRAVENOUS | Status: AC
  Filled 2024-02-22 (×2): qty 250

## 2024-02-22 MED ORDER — HEPARIN SOD (PORK) LOCK FLUSH 100 UNIT/ML IV SOLN
500.0000 [IU] | Freq: Once | INTRAVENOUS | Status: AC
  Administered 2024-02-22: 500 [IU] via INTRAVENOUS
  Filled 2024-02-22: qty 5

## 2024-02-22 MED ORDER — DEXAMETHASONE SODIUM PHOSPHATE 10 MG/ML IJ SOLN
5.0000 mg | Freq: Once | INTRAMUSCULAR | Status: AC
  Administered 2024-02-22: 5 mg via INTRAVENOUS
  Filled 2024-02-22: qty 1

## 2024-02-22 MED ORDER — DEXAMETHASONE 2 MG PO TABS: 2.0000 mg | ORAL_TABLET | Freq: Every day | ORAL | 0 refills | Status: DC

## 2024-02-22 NOTE — Progress Notes (Signed)
 Symptom Management Clinic  [facility] A Department of the [place]. 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 [facility])  09/21/2023 Initial Diagnosis   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 [facility])   09/26/2023 -  Chemotherapy   Patient is on Treatment Plan : neuronedocrine endometrium Carboplatin  + Etoposide      Endometrial carcinoma (HCC)  09/21/2023 Initial Diagnosis   Endometrial carcinoma (HCC)   09/21/2023 Cancer Staging   Staging form: Corpus Uteri - Carcinoma and Carcinosarcoma, AJCC 8th Edition and FIGO 2023 - Clinical stage from 09/21/2023: FIGO Stage IVC, calculated as Stage IVB (cN2a, pM1) - Signed by Melanee Annah BROCKS, MD on 09/21/2023 Histologic grade (G): G3 Histologic grading system: 3 grade system     Interval history- Tonya [name] is a 58 y.o. female diagnosed with endometrial cancer, currently s/p 6 cycles carbo-etoposide  on 01/25/24, who presents to symptom management clinic for complaints of abdominal bloating, distention, bilious reflux, and weight loss. Symptoms have been present since she completed chemotherapy. Weight down trending. No fevers or chills. Localizes abdominal pressure and fullness to upper abdomen. She hasn't noticed effect with zofran  and compazine  and therefore discontinued them. She has started PPI. Complains of dry mouth. Moving her bowels.   Review of systems- Review of  Systems  Constitutional:  Positive for malaise/fatigue. Negative for chills, fever and weight loss.  HENT:  Negative for hearing loss, nosebleeds, sore throat and tinnitus.   Respiratory:  Negative for cough, hemoptysis, shortness of breath and wheezing.   Cardiovascular:  Negative for chest pain, palpitations and leg swelling.  Gastrointestinal:  Positive for abdominal pain, diarrhea ('blobs'), heartburn, nausea and vomiting. Negative for blood in stool, constipation and melena.  Genitourinary:  Negative for dysuria and urgency.  Musculoskeletal:  Negative for back pain, falls, joint pain and myalgias.  Skin:  Negative for itching and rash.  Neurological:  Negative for dizziness, tingling, sensory change, loss of consciousness, weakness and headaches.  Endo/Heme/Allergies:  Negative for environmental allergies. Does not bruise/bleed easily.  Psychiatric/Behavioral:  Negative for depression. The patient is not nervous/anxious and does not have insomnia.     No Known Allergies  Past Medical History:  Diagnosis Date   Cancer (HCC)    Coronary artery disease    GERD (gastroesophageal reflux disease)    Hypertension    Hypothyroidism    Myocardial infarction (HCC)    Thyroid  disease    Past Surgical History:  Procedure Laterality Date   CARDIAC CATHETERIZATION     CESAREAN SECTION     CORONARY STENT INTERVENTION N/A 07/24/2019   Procedure: CORONARY STENT INTERVENTION;  Surgeon: Mady Lonni, MD;  Location: [facility] INVASIVE CV LAB;  Service: Cardiovascular;  Laterality: N/A;  RCA & CFX   IR IMAGING GUIDED PORT INSERTION  09/23/2023   LEFT HEART CATH AND CORONARY ANGIOGRAPHY N/A 07/24/2019   Procedure: LEFT HEART CATH AND CORONARY ANGIOGRAPHY;  Surgeon: Hester Wolm PARAS, MD;  Location: [facility] INVASIVE CV LAB;  Service: Cardiovascular;  Laterality:  N/A;   TONSILLECTOMY     Social History   Socioeconomic History   Marital status: Married    Spouse name: Haillee Johann   Number of children:  1   Years of education: Not on file   Highest education level: Not on file  Occupational History   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[employer])  Tobacco Use   Smoking status: Never   Smokeless tobacco: Never  Vaping Use   Vaping status: Never Used  Substance and Sexual Activity   Alcohol use: Not Currently    Comment: occ   Drug use: Never   Sexual activity: Not Currently  Other Topics Concern   Not on file  Social History Narrative   Not on file   Social Drivers of Health   Financial Resource Strain: Low Risk  (07/23/2019)   Overall Financial Resource Strain (CARDIA)    Difficulty of Paying Living Expenses: Not hard at all  Food Insecurity: No Food Insecurity (11/05/2023)   Hunger Vital Sign    Worried About Running Out of Food in the Last Year: Never true    Ran Out of Food in the Last Year: Never true  Transportation Needs: No Transportation Needs (11/05/2023)   PRAPARE - Administrator, Civil Service (Medical): No    Lack of Transportation (Non-Medical): No  Physical Activity: Unknown (07/23/2019)   Exercise Vital Sign    Days of Exercise per Week: 2 days    Minutes of Exercise per Session: Not on file  Stress: No Stress Concern Present (07/23/2019)   [employer] of Occupational Health - Occupational Stress Questionnaire    Feeling of Stress : Not at all  Social Connections: Moderately Integrated (10/11/2023)   Social Connection and Isolation Panel    Frequency of Communication with Friends and Family: More than three times a week    Frequency of Social Gatherings with Friends and Family: Twice a week    Attends Religious Services: 1 to 4 times per year    Active Member of [employer] or Organizations: No    Attends Banker Meetings: Never    Marital Status: Married  Catering manager Violence: Not At Risk (11/05/2023)   Humiliation, Afraid, Rape, and Kick questionnaire    Fear of Current or Ex-Partner: No    Emotionally Abused: No    Physically  Abused: No    Sexually Abused: No   Family History  Problem Relation Age of Onset   Atrial fibrillation Mother        Passed January 2024.   Colon cancer Mother        Diagnosed 2007   Heart Problems Father    Heart attack Father    Heart disease Father    Current Outpatient Medications:    Cholecalciferol (VITAMIN D-1000 MAX ST) 25 MCG (1000 UT) tablet, Take 1,000 Units by mouth daily., Disp: , Rfl:    lidocaine -prilocaine  (EMLA ) cream, Apply to affected area once, Disp: 30 g, Rfl: 3   losartan -hydrochlorothiazide (HYZAAR) 50-12.5 MG tablet, Take 1 tablet by mouth daily., Disp: , Rfl:    metoprolol  tartrate (LOPRESSOR ) 25 MG tablet, Take 0.5 tablets (12.5 mg total) by mouth 2 (two) times daily. (Patient not taking: Reported on 02/13/2024), Disp: 15 tablet, Rfl: 0   Multiple Vitamin (MULTI-VITAMIN) tablet, Take 1 tablet by mouth daily., Disp: , Rfl:    Nutritional Supplements (ENSURE HIGH PROTEIN) LIQD, 237 mL 2 TIMES DAILY (route: oral), Disp: , Rfl:    nystatin  cream (MYCOSTATIN ), Apply  topically 2 (two) times daily., Disp: 30 g, Rfl: 0   ondansetron  (ZOFRAN ) 8 MG tablet, Take 1 tablet (8 mg total) by mouth every 8 (eight) hours as needed for nausea, vomiting or refractory nausea / vomiting. Start on the third day after carboplatin ., Disp: 30 tablet, Rfl: 1   pantoprazole (PROTONIX) 20 MG tablet, Take 1 tablet (20 mg total) by mouth daily. For acid reflux, Disp: 30 tablet, Rfl: 2   potassium chloride  (KLOR-CON  M) 10 MEQ tablet, Take 2 tablets (20 mEq total) by mouth 2 (two) times daily., Disp: 120 tablet, Rfl: 1   prochlorperazine  (COMPAZINE ) 10 MG tablet, Take 1 tablet (10 mg total) by mouth every 6 (six) hours as needed for nausea or vomiting., Disp: 30 tablet, Rfl: 1   sodium chloride  1 g tablet, Take 1 tablet (1 g total) by mouth 2 (two) times daily with a meal., Disp: 60 tablet, Rfl: 1   thyroid  (ARMOUR) 120 MG tablet, Take 120 mg by mouth daily before breakfast., Disp: , Rfl:     Vitamin D, Ergocalciferol, (DRISDOL) 1.25 MG (50000 UNIT) CAPS capsule, Take 50,000 Units by mouth once a week. (Patient not taking: Reported on 02/13/2024), Disp: , Rfl:  No current facility-administered medications for this visit.  Facility-Administered Medications Ordered in Other Visits:    0.9 %  sodium chloride  infusion, , Intravenous, Continuous, Melanee Annah BROCKS, MD, Last Rate: 10 mL/hr at 12/07/23 1421, New Bag at 12/07/23 1421  Physical Exam:  Vitals:   02/22/24 1230 02/22/24 1235 02/22/24 1240  BP: (!) 157/100 (!) 160/100   Pulse: (!) 104    Resp: 18    Temp: 98.2 F (36.8 C)    TempSrc: Tympanic    SpO2: 100%    Weight:   177 lb 9.6 oz (80.6 kg)  Height:   5' 4 (1.626 m)   Physical Exam Vitals reviewed.  Constitutional:      Appearance: She is obese. She is not ill-appearing.  Pulmonary:     Breath sounds: Normal breath sounds.  Abdominal:     General: Bowel sounds are normal. There are no signs of injury.     Palpations: Abdomen is soft.     Tenderness: There is abdominal tenderness in the right lower quadrant, epigastric area and left lower quadrant.  Skin:    Coloration: Skin is pale.  Neurological:     Mental Status: She is alert and oriented to person, place, and time.  Psychiatric:        Mood and Affect: Mood normal.        Behavior: Behavior normal.       Latest Ref Rng & Units 02/22/2024   12:13 PM  CMP  Glucose 70 - 99 mg/dL 867   BUN 6 - 20 mg/dL 30   Creatinine 9.55 - 1.00 mg/dL 9.27   Sodium 864 - 854 mmol/L 128   Potassium 3.5 - 5.1 mmol/L 3.3   Chloride 98 - 111 mmol/L 86   CO2 22 - 32 mmol/L 26   Calcium  8.9 - 10.3 mg/dL 9.8       Latest Ref Rng & Units 02/22/2024   12:13 PM  CBC  WBC 4.0 - 10.5 K/uL 10.9   Hemoglobin 12.0 - 15.0 g/dL 89.6   Hematocrit 63.9 - 46.0 % 30.5   Platelets 150 - 400 K/uL 450    No results found.  Assessment and plan- Patient is a 58 y.o. 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 no effect with zofran  and compazine  so she has self discontinued. Unrelieved by simethicone. With weight loss. Etiology unclear- recommend imaging for restaging and to assess for possible etiology of her symptoms. Plan today to give IV fluids, 1 liter, along with 5 mg decadron  then dex 2 mg by mouth daily x 5 days. Updated Dr Melanee.   F/u with gyn onc as scheduled or if symptoms do not improve or worsen in interim- la  Visit Diagnosis 1. Endometrial carcinoma (HCC)   2. Abdominal bloating   3. Gastric reflux    Patient expressed understanding and was in agreement with this plan. She also understands that She can call clinic at any time with any questions, concerns, or complaints.   Thank you for allowing me to participate in the care of this very pleasant patient.   Tinnie Dawn, DNP, AGNP-C, AOCNP [facility] [phone]

## 2024-02-22 NOTE — Progress Notes (Signed)
 Per Tonya [name] pt ok to start back on B/P medication. Per Pt.  I am going to discuss with primary doctor Cleotilde about changing the current blood pressure medication because the current blood pressure medication causes too much water  lose.

## 2024-02-23 ENCOUNTER — Other Ambulatory Visit: Payer: Self-pay

## 2024-02-23 ENCOUNTER — Ambulatory Visit
Admission: RE | Admit: 2024-02-23 | Discharge: 2024-02-23 | Disposition: A | Discharge status: Home / Self Care | Source: Ambulatory Visit | Attending: Oncology | Admitting: Oncology

## 2024-02-23 DIAGNOSIS — C7B8 Other secondary neuroendocrine tumors: Secondary | ICD-10-CM | POA: Insufficient documentation

## 2024-02-23 DIAGNOSIS — C7A8 Other malignant neuroendocrine tumors: Secondary | ICD-10-CM | POA: Insufficient documentation

## 2024-02-23 MED ORDER — IOHEXOL 300 MG/ML  SOLN
100.0000 mL | Freq: Once | INTRAMUSCULAR | Status: AC | PRN
  Administered 2024-02-23: 100 mL via INTRAVENOUS

## 2024-02-23 MED ORDER — HEPARIN SOD (PORK) LOCK FLUSH 100 UNIT/ML IV SOLN
500.0000 [IU] | Freq: Once | INTRAVENOUS | Status: AC
  Administered 2024-02-23: 500 [IU] via INTRAVENOUS

## 2024-02-25 ENCOUNTER — Ambulatory Visit: Payer: Self-pay | Admitting: Oncology

## 2024-02-27 ENCOUNTER — Other Ambulatory Visit: Payer: Self-pay

## 2024-02-27 ENCOUNTER — Telehealth: Payer: Self-pay | Admitting: *Deleted

## 2024-02-27 ENCOUNTER — Inpatient Hospital Stay (HOSPITAL_BASED_OUTPATIENT_CLINIC_OR_DEPARTMENT_OTHER): Admitting: Nurse Practitioner

## 2024-02-27 ENCOUNTER — Encounter: Payer: Self-pay | Admitting: Nurse Practitioner

## 2024-02-27 ENCOUNTER — Inpatient Hospital Stay

## 2024-02-27 VITALS — BP 139/90 | HR 101 | Temp 97.7°F

## 2024-02-27 DIAGNOSIS — Z95828 Presence of other vascular implants and grafts: Secondary | ICD-10-CM

## 2024-02-27 DIAGNOSIS — Z7189 Other specified counseling: Secondary | ICD-10-CM | POA: Diagnosis not present

## 2024-02-27 DIAGNOSIS — C541 Malignant neoplasm of endometrium: Secondary | ICD-10-CM | POA: Diagnosis not present

## 2024-02-27 DIAGNOSIS — R112 Nausea with vomiting, unspecified: Secondary | ICD-10-CM

## 2024-02-27 MED ORDER — PROCHLORPERAZINE EDISYLATE 10 MG/2ML IJ SOLN
10.0000 mg | Freq: Once | INTRAMUSCULAR | Status: AC
  Administered 2024-02-27: 10 mg via INTRAVENOUS
  Filled 2024-02-27: qty 2

## 2024-02-27 MED ORDER — HEPARIN SOD (PORK) LOCK FLUSH 100 UNIT/ML IV SOLN
500.0000 [IU] | Freq: Once | INTRAVENOUS | Status: AC
  Administered 2024-02-27: 500 [IU]
  Filled 2024-02-27: qty 5

## 2024-02-27 MED ORDER — ONDANSETRON HCL 4 MG/2ML IJ SOLN: 8.0000 mg | Freq: Once | INTRAMUSCULAR | Status: DC

## 2024-02-27 MED ORDER — SODIUM CHLORIDE 0.9 % IV SOLN
INTRAVENOUS | Status: DC
  Filled 2024-02-27 (×2): qty 250

## 2024-02-27 MED ORDER — DEXAMETHASONE SODIUM PHOSPHATE 10 MG/ML IJ SOLN
10.0000 mg | Freq: Once | INTRAMUSCULAR | Status: AC
  Administered 2024-02-27: 10 mg via INTRAVENOUS
  Filled 2024-02-27: qty 1

## 2024-02-27 NOTE — Telephone Encounter (Signed)
 Pt provided [facility] apts for this afternoon.

## 2024-02-27 NOTE — Telephone Encounter (Signed)
 Lauren- please look into this

## 2024-02-27 NOTE — Telephone Encounter (Signed)
 Pt called at 1247pm requesting [facility] visit for  fluids like last week.  States she is still throwing up, unable to keep anything down, very weak.

## 2024-02-27 NOTE — Progress Notes (Unsigned)
 Symptom Management Clinic  [facility] at [facility] A Department of the Trail. 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 [facility])  09/21/2023 Initial Diagnosis   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 [facility])   09/26/2023 -  Chemotherapy   Patient is on Treatment Plan : neuronedocrine endometrium Carboplatin  + Etoposide      Endometrial carcinoma (HCC)  09/21/2023 Initial Diagnosis   Endometrial carcinoma (HCC)   09/21/2023 Cancer Staging   Staging form: Corpus Uteri - Carcinoma and Carcinosarcoma, AJCC 8th Edition and FIGO 2023 - Clinical stage from 09/21/2023: FIGO Stage IVC, calculated as Stage IVB (cN2a, pM1) - Signed by Melanee Annah BROCKS, MD on 09/21/2023 Histologic grade (G): G3 Histologic grading system: 3 grade system     Interval history- Tonya [name] is a 58 y.o. 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 She fell at home this morning due to weakness of her legs and lightheadedness. Says she is drinking sips of liquid but urinating only a couple times a day. Last solid food was week ago and she vomited it. No improvement with protonix .   ECOG FS:2 - Symptomatic, <50% confined  to bed  Review of systems- Review of Systems  Constitutional:  Positive for malaise/fatigue and weight loss. Negative for chills and fever.  HENT:  Negative for hearing loss, nosebleeds, sore throat and tinnitus.   Eyes:  Negative for blurred vision and double vision.  Respiratory:  Negative for cough, hemoptysis, shortness of breath and wheezing.   Cardiovascular:  Negative for chest pain, palpitations and leg swelling.  Gastrointestinal:  Positive for nausea and vomiting. Negative for abdominal pain, blood in stool, constipation, diarrhea and melena.       Passing gas  Genitourinary:  Negative for dysuria and urgency.  Musculoskeletal:  Positive for falls. Negative for back pain, joint pain and myalgias.  Skin:  Negative for itching and rash.  Neurological:  Positive for dizziness and weakness. Negative for tingling, sensory change, loss of consciousness and headaches.  Endo/Heme/Allergies:  Negative for environmental allergies. Does not bruise/bleed easily.  Psychiatric/Behavioral:  Negative for depression. The patient is not nervous/anxious and does not have insomnia.     Current treatment- carbo-etoposide  s/p cycle 6 on 01/27/24.   No Known Allergies  Past Medical History:  Diagnosis Date   Cancer (HCC)    Coronary artery disease    GERD (gastroesophageal reflux disease)    Hypertension    Hypothyroidism    Myocardial infarction (HCC)    Thyroid  disease     Past Surgical History:  Procedure Laterality Date   CARDIAC CATHETERIZATION     CESAREAN SECTION     CORONARY STENT INTERVENTION N/A 07/24/2019   Procedure: CORONARY STENT INTERVENTION;  Surgeon: Mady Lonni, MD;  Location: [facility] INVASIVE CV LAB;  Service: Cardiovascular;  Laterality: N/A;  RCA &  CFX   IR IMAGING GUIDED PORT INSERTION  09/23/2023   LEFT HEART CATH AND CORONARY ANGIOGRAPHY N/A 07/24/2019   Procedure: LEFT HEART CATH AND CORONARY ANGIOGRAPHY;  Surgeon: Hester Wolm PARAS, MD;  Location: [facility] INVASIVE CV  LAB;  Service: Cardiovascular;  Laterality: N/A;   TONSILLECTOMY      Social History   Socioeconomic History   Marital status: Married    Spouse name: Keasia Dubose   Number of children: 1   Years of education: Not on file   Highest education level: Not on file  Occupational History   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[employer])  Tobacco Use   Smoking status: Never   Smokeless tobacco: Never  Vaping Use   Vaping status: Never Used  Substance and Sexual Activity   Alcohol use: Not Currently    Comment: occ   Drug use: Never   Sexual activity: Not Currently  Other Topics Concern   Not on file  Social History Narrative   Not on file   Social Drivers of Health   Financial Resource Strain: Low Risk  (07/23/2019)   Overall Financial Resource Strain (CARDIA)    Difficulty of Paying Living Expenses: Not hard at all  Food Insecurity: No Food Insecurity (11/05/2023)   Hunger Vital Sign    Worried About Running Out of Food in the Last Year: Never true    Ran Out of Food in the Last Year: Never true  Transportation Needs: No Transportation Needs (11/05/2023)   PRAPARE - Administrator, Civil Service (Medical): No    Lack of Transportation (Non-Medical): No  Physical Activity: Unknown (07/23/2019)   Exercise Vital Sign    Days of Exercise per Week: 2 days    Minutes of Exercise per Session: Not on file  Stress: No Stress Concern Present (07/23/2019)   [employer] of Occupational Health - Occupational Stress Questionnaire    Feeling of Stress : Not at all  Social Connections: Moderately Integrated (10/11/2023)   Social Connection and Isolation Panel    Frequency of Communication with Friends and Family: More than three times a week    Frequency of Social Gatherings with Friends and Family: Twice a week    Attends Religious Services: 1 to 4 times per year    Active Member of [employer] or Organizations: No    Attends Banker Meetings: Never    Marital Status:  Married  Catering manager Violence: Not At Risk (11/05/2023)   Humiliation, Afraid, Rape, and Kick questionnaire    Fear of Current or Ex-Partner: No    Emotionally Abused: No    Physically Abused: No    Sexually Abused: No    Family History  Problem Relation Age of Onset   Atrial fibrillation Mother        Passed January 2024.   Colon cancer Mother        Diagnosed 2007   Heart Problems Father    Heart attack Father    Heart disease Father      Current Outpatient Medications:    dexamethasone  (DECADRON ) 2 MG tablet, Take 1 tablet (2 mg total) by mouth daily., Disp: 5 tablet, Rfl: 0   pantoprazole  (PROTONIX ) 20 MG tablet, Take 1 tablet (20 mg total) by mouth daily. For acid reflux, Disp: 30 tablet, Rfl: 2   thyroid  (ARMOUR) 120 MG tablet, Take 120 mg by mouth daily before breakfast., Disp: , Rfl:    Cholecalciferol (VITAMIN D-1000 MAX ST) 25 MCG (1000 UT) tablet,  Take 1,000 Units by mouth daily. (Patient not taking: Reported on 02/27/2024), Disp: , Rfl:    lidocaine -prilocaine  (EMLA ) cream, Apply to affected area once (Patient not taking: Reported on 02/27/2024), Disp: 30 g, Rfl: 3   losartan -hydrochlorothiazide (HYZAAR) 50-12.5 MG tablet, Take 1 tablet by mouth daily. (Patient not taking: Reported on 02/27/2024), Disp: , Rfl:    metoprolol  tartrate (LOPRESSOR ) 25 MG tablet, Take 0.5 tablets (12.5 mg total) by mouth 2 (two) times daily. (Patient not taking: Reported on 02/27/2024), Disp: 15 tablet, Rfl: 0   Multiple Vitamin (MULTI-VITAMIN) tablet, Take 1 tablet by mouth daily. (Patient not taking: Reported on 02/27/2024), Disp: , Rfl:    Nutritional Supplements (ENSURE HIGH PROTEIN) LIQD, 237 mL 2 TIMES DAILY (route: oral) (Patient not taking: Reported on 02/27/2024), Disp: , Rfl:    nystatin  cream (MYCOSTATIN ), Apply topically 2 (two) times daily. (Patient not taking: Reported on 02/27/2024), Disp: 30 g, Rfl: 0   ondansetron  (ZOFRAN ) 8 MG tablet, Take 1 tablet (8 mg total) by mouth every 8 (eight)  hours as needed for nausea, vomiting or refractory nausea / vomiting. Start on the third day after carboplatin . (Patient not taking: Reported on 02/27/2024), Disp: 30 tablet, Rfl: 1   potassium chloride  (KLOR-CON  M) 10 MEQ tablet, Take 2 tablets (20 mEq total) by mouth 2 (two) times daily. (Patient not taking: Reported on 02/27/2024), Disp: 120 tablet, Rfl: 1   prochlorperazine  (COMPAZINE ) 10 MG tablet, Take 1 tablet (10 mg total) by mouth every 6 (six) hours as needed for nausea or vomiting. (Patient not taking: Reported on 02/27/2024), Disp: 30 tablet, Rfl: 1   sodium chloride  1 g tablet, Take 1 tablet (1 g total) by mouth 2 (two) times daily with a meal. (Patient not taking: Reported on 02/27/2024), Disp: 60 tablet, Rfl: 1   Vitamin D, Ergocalciferol, (DRISDOL) 1.25 MG (50000 UNIT) CAPS capsule, Take 50,000 Units by mouth once a week. (Patient not taking: Reported on 02/27/2024), Disp: , Rfl:  No current facility-administered medications for this visit.  Facility-Administered Medications Ordered in Other Visits:    0.9 %  sodium chloride  infusion, , Intravenous, Continuous, Melanee Annah BROCKS, MD, Last Rate: 10 mL/hr at 12/07/23 1421, New Bag at 12/07/23 1421   0.9 %  sodium chloride  infusion, , Intravenous, Continuous, Dasie Tinnie MATSU, NP, Last Rate: 999 mL/hr at 02/27/24 1531, New Bag at 02/27/24 1531  Physical exam:  Vitals:   02/27/24 1545  BP: (!) 139/90  Pulse: (!) 101  Temp: 97.7 F (36.5 C)  TempSrc: Tympanic   Physical Exam Vitals reviewed.  Constitutional:      Comments: Fatigued appearing. Unaccompanied. In wheelchair.   HENT:     Mouth/Throat:     Mouth: Mucous membranes are dry.  Cardiovascular:     Rate and Rhythm: Normal rate and regular rhythm.  Pulmonary:     Effort: No respiratory distress.     Comments: Diminished bilaterally Abdominal:     General: Bowel sounds are normal. There is no distension.     Palpations: Abdomen is soft.     Tenderness: There is no abdominal  tenderness.  Skin:    Coloration: Skin is pale.  Neurological:     Mental Status: She is alert and oriented to person, place, and time.  Psychiatric:        Mood and Affect: Mood normal.        Behavior: Behavior normal.         Latest Ref Rng & Units 02/22/2024  12:13 PM  CMP  Glucose 70 - 99 mg/dL 867   BUN 6 - 20 mg/dL 30   Creatinine 9.55 - 1.00 mg/dL 9.27   Sodium 864 - 854 mmol/L 128   Potassium 3.5 - 5.1 mmol/L 3.3   Chloride 98 - 111 mmol/L 86   CO2 22 - 32 mmol/L 26   Calcium  8.9 - 10.3 mg/dL 9.8       Latest Ref Rng & Units 02/22/2024   12:13 PM  CBC  WBC 4.0 - 10.5 K/uL 10.9   Hemoglobin 12.0 - 15.0 g/dL 89.6   Hematocrit 63.9 - 46.0 % 30.5   Platelets 150 - 400 K/uL 450     No images are attached to the encounter.  CT CHEST ABDOMEN PELVIS W CONTRAST Result Date: 02/24/2024 CLINICAL DATA:  Metastatic endometrial cancer, ongoing chemotherapy * Tracking Code: BO * EXAM: CT CHEST, ABDOMEN, AND PELVIS WITH CONTRAST TECHNIQUE: Multidetector CT imaging of the chest, abdomen and pelvis was performed following the standard protocol during bolus administration of intravenous contrast. RADIATION DOSE REDUCTION: This exam was performed according to the departmental dose-optimization program which includes automated exposure control, adjustment of the mA and/or kV according to patient size and/or use of iterative reconstruction technique. CONTRAST:  OMNIPAQUE  IOHEXOL  300 MG/ML  SOLN COMPARISON:  11/25/2023 FINDINGS: CT CHEST FINDINGS Cardiovascular: Right chest port catheter. Scattered aortic atherosclerosis. Normal heart size. Three-vessel coronary artery calcifications. No pericardial effusion. Mediastinum/Nodes: Newly enlarged left axillary and subpectoral lymph nodes measuring up to 2.1 x 1.6 cm (series 2, image 12). Thyroid  gland, trachea, and esophagus demonstrate no significant findings. Lungs/Pleura: Interval resolution of previously seen left pleural effusion. Multiple  new and enlarged bilateral pulmonary nodules, for example a 0.6 cm nodule of the anterior right lower lobe, previously no greater than 0.2 cm (series 3, image 88), and a new nodule in the posterior left upper lobe measuring 0.7 cm (series 3, image 54). Musculoskeletal: No chest wall abnormality. No acute osseous findings. CT ABDOMEN PELVIS FINDINGS Hepatobiliary: No solid liver abnormality is seen. No gallstones, gallbladder wall thickening, or biliary dilatation. Pancreas: Unremarkable. No pancreatic ductal dilatation or surrounding inflammatory changes. Spleen: Normal in size without significant abnormality. Adrenals/Urinary Tract: Adrenal glands are unremarkable. Kidneys are normal, without renal calculi, solid lesion, or hydronephrosis. New, enhancing nodular soft tissue along the posterior right aspect of the bladder dome measuring approximately 3.3 x 1.3 cm (series 2, image 101). Stomach/Bowel: Mucosal thickening and hyperenhancement of the stomach (series 2, image 61). Appendix appears normal. No evidence of bowel wall thickening, distention, or inflammatory changes. Vascular/Lymphatic: Aortic atherosclerosis. Interval enlargement of numerous abdominal and pelvic lymph nodes, involving the gastrohepatic ligament, celiac, porta hepatis, portacaval, retroperitoneal, bilateral iliac, and pelvic sidewall stations. Many lymph nodes are matted, particularly in the retroperitoneum and iliac stations. Largest index left iliac nodes measure up to 3.1 x 2.1 cm, previously on measurable (series 2, image 71). Largest portacaval nodes measure up to 2.9 x 1.9 cm, previously 1.3 x 0.9 cm (series 2, image 57). Reproductive: Matted appearance of the uterus and ovaries, similar to prior examination (series 2, image 92). Other: No abdominal wall hernia or abnormality. Small volume ascites throughout the abdomen and pelvis, similar in volume to prior examination. Interval increase in very fine peritoneal and omental nodularity  in the ventral abdomen (series 2, image 72). Musculoskeletal: No acute osseous findings. Unchanged high-grade wedge deformity of L1 (series 6, image 94). IMPRESSION: 1. Interval enlargement of numerous lymph nodes throughout the  chest, abdomen, and pelvis, consistent with worsened nodal metastatic disease. 2. Multiple new and enlarged bilateral pulmonary nodules, consistent with pulmonary metastatic disease. 3. Small volume ascites throughout the abdomen pelvis, similar to prior examination. Interval increase in fine peritoneal and omental nodularity in the ventral abdomen as well as new, enhancing nodular soft tissue along the posterior right aspect of the bladder dome consistent with worsened peritoneal metastatic disease. 4. Matted post treatment appearance of the uterus and ovaries, similar to prior examination. 5. Mucosal thickening and hyperenhancement of the stomach, suggestive of nonspecific infectious or inflammatory gastritis. 6. Interval resolution of previously seen left pleural effusion. 7. Coronary artery disease. Aortic Atherosclerosis (ICD10-I70.0). Electronically Signed   By: Marolyn JONETTA Jaksch M.D.   On: 02/24/2024 16:28   Assessment and plan- Patient is a 58 y.o. female who presents to symptom management clinic for:   Nausea & vomiting- likely related to delayed gastric emptying vs extrinsic compression from progressive malignancy. Dr Melanee reviewed ct scan today (see below). Plan for IV fluids, decadron  10 mg IV today, zofran  8 mg IV. She can restart oral decadron  tomorrow.  Weight loss- due to intolerance to food & fluids & progressive malignancy. Encouraged small frequent calorie dense meals.  Pain due to malignancy- currently denies pain. Monitor.  At risk for malignant bowel obstruction- at high risk of malignant bowel obstruction. She is currently passing flatus and having regular bowel movements. Monitor closely. Discussed symptoms that would be concerning for development of SBO. Would  consider supportive care including venting peg, +/- steroids, octreotide , prokinetics.  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  Diagnosed Dec 2024 with widespread widespread metastatic disease to abdominopelvic and thoracic nodes, omentum and lungs. Abnormal appearance of the uterus and lower uterine segment and cervix favoring endometrial or cervical primary. Evaluated by Dr Mancil. S/p 6 cycles of carbo-etoposide . CT due to worsening GI symptoms & restaging showed worsening lymphadenopathy, enlarged and new pulmonary nodules, small volume ascites, increase in peritoneal and omental nodularity consistent with worsening peritoneal disease, matted appearance of uterus and ovaries similar to previous, mucosal thickening of stomach d/t possible gastritis. Plan to send Caris testing. I've reached out to Dr Mancil for input. Plan to add patient to gyn-onc for consult this week.  Goals of care-  We discussed the incurable nature of her malignancy and overall guarded prognosis in view of progressive disease. The patient understands the goal of care is palliative. Discussed advance care planning including DNR and/or DNI status. No decisions were made at this time regarding code status. At her request, I also provided her with [facility] documentation. She verbalizes that her husband would make medical decisions on her behalf. I also introduced palliative care as specialized medical care for people living with serious illness and that it focuses on providing relief from symptoms and stress of serious illness. Discussed that the goal is to improve quality of life for patient and family while continuing to receive treatment.   Tinnie Dawn, DNP, AGNP-C, AOCNP [facility] [phone] (clinic)  I personally interviewed and examined the patient. I independently assessed patient and performed portions of physical exam as above. Assessment and plan was  discussed with patient. All questions answered in detail.  Dr. Annah Melanee   Visit Diagnosis 1. Endometrial carcinoma (HCC)   2. Advanced directives, counseling/discussion   3. Nausea and vomiting, unspecified vomiting type   4. Goals of care, counseling/discussion    Patient expressed understanding and was in agreement with  this plan. She also understands that She can call clinic at any time with any questions, concerns, or complaints.   Thank you for allowing me to participate in the care of this very pleasant patient.   Tinnie Dawn, DNP, AGNP-C, AOCNP [facility] at [facility] [phone]

## 2024-02-28 ENCOUNTER — Ambulatory Visit: Admitting: Oncology

## 2024-02-28 ENCOUNTER — Telehealth: Payer: Self-pay | Admitting: *Deleted

## 2024-02-28 DIAGNOSIS — Z7189 Other specified counseling: Secondary | ICD-10-CM | POA: Insufficient documentation

## 2024-02-28 NOTE — Telephone Encounter (Signed)
 02/28/24-Faxed Caris testing form to [employer]. Please look out for results.

## 2024-02-29 ENCOUNTER — Inpatient Hospital Stay

## 2024-02-29 ENCOUNTER — Emergency Department

## 2024-02-29 ENCOUNTER — Encounter: Payer: Self-pay | Admitting: Oncology

## 2024-02-29 ENCOUNTER — Inpatient Hospital Stay
Admission: EM | Admit: 2024-02-29 | Discharge: 2024-03-08 | DRG: 754 | Disposition: A | Discharge status: Home Health | Source: Ambulatory Visit | Attending: Internal Medicine | Admitting: Internal Medicine

## 2024-02-29 ENCOUNTER — Other Ambulatory Visit: Payer: Self-pay | Admitting: Oncology

## 2024-02-29 ENCOUNTER — Ambulatory Visit: Admitting: Oncology

## 2024-02-29 ENCOUNTER — Inpatient Hospital Stay (HOSPITAL_BASED_OUTPATIENT_CLINIC_OR_DEPARTMENT_OTHER): Admitting: Obstetrics and Gynecology

## 2024-02-29 ENCOUNTER — Other Ambulatory Visit: Payer: Self-pay

## 2024-02-29 ENCOUNTER — Ambulatory Visit

## 2024-02-29 VITALS — BP 98/62 | HR 102 | Temp 98.6°F | Resp 20 | Wt 180.0 lb

## 2024-02-29 DIAGNOSIS — Z515 Encounter for palliative care: Secondary | ICD-10-CM

## 2024-02-29 DIAGNOSIS — K3189 Other diseases of stomach and duodenum: Secondary | ICD-10-CM | POA: Diagnosis present

## 2024-02-29 DIAGNOSIS — I252 Old myocardial infarction: Secondary | ICD-10-CM

## 2024-02-29 DIAGNOSIS — E43 Unspecified severe protein-calorie malnutrition: Secondary | ICD-10-CM | POA: Diagnosis present

## 2024-02-29 DIAGNOSIS — C786 Secondary malignant neoplasm of retroperitoneum and peritoneum: Secondary | ICD-10-CM | POA: Diagnosis present

## 2024-02-29 DIAGNOSIS — C7B8 Other secondary neuroendocrine tumors: Secondary | ICD-10-CM

## 2024-02-29 DIAGNOSIS — E1151 Type 2 diabetes mellitus with diabetic peripheral angiopathy without gangrene: Secondary | ICD-10-CM | POA: Diagnosis present

## 2024-02-29 DIAGNOSIS — C7801 Secondary malignant neoplasm of right lung: Secondary | ICD-10-CM | POA: Diagnosis present

## 2024-02-29 DIAGNOSIS — I110 Hypertensive heart disease with heart failure: Secondary | ICD-10-CM | POA: Diagnosis present

## 2024-02-29 DIAGNOSIS — C7A8 Other malignant neuroendocrine tumors: Secondary | ICD-10-CM | POA: Diagnosis not present

## 2024-02-29 DIAGNOSIS — Z800 Family history of malignant neoplasm of digestive organs: Secondary | ICD-10-CM

## 2024-02-29 DIAGNOSIS — E1165 Type 2 diabetes mellitus with hyperglycemia: Secondary | ICD-10-CM | POA: Diagnosis present

## 2024-02-29 DIAGNOSIS — D638 Anemia in other chronic diseases classified elsewhere: Secondary | ICD-10-CM | POA: Diagnosis present

## 2024-02-29 DIAGNOSIS — E860 Dehydration: Secondary | ICD-10-CM | POA: Diagnosis present

## 2024-02-29 DIAGNOSIS — I2510 Atherosclerotic heart disease of native coronary artery without angina pectoris: Secondary | ICD-10-CM | POA: Diagnosis present

## 2024-02-29 DIAGNOSIS — C169 Malignant neoplasm of stomach, unspecified: Secondary | ICD-10-CM | POA: Diagnosis present

## 2024-02-29 DIAGNOSIS — I5042 Chronic combined systolic (congestive) and diastolic (congestive) heart failure: Secondary | ICD-10-CM | POA: Diagnosis present

## 2024-02-29 DIAGNOSIS — R112 Nausea with vomiting, unspecified: Secondary | ICD-10-CM | POA: Diagnosis not present

## 2024-02-29 DIAGNOSIS — Z8249 Family history of ischemic heart disease and other diseases of the circulatory system: Secondary | ICD-10-CM

## 2024-02-29 DIAGNOSIS — D649 Anemia, unspecified: Secondary | ICD-10-CM

## 2024-02-29 DIAGNOSIS — I5022 Chronic systolic (congestive) heart failure: Secondary | ICD-10-CM

## 2024-02-29 DIAGNOSIS — E039 Hypothyroidism, unspecified: Secondary | ICD-10-CM | POA: Diagnosis present

## 2024-02-29 DIAGNOSIS — I10 Essential (primary) hypertension: Secondary | ICD-10-CM

## 2024-02-29 DIAGNOSIS — I255 Ischemic cardiomyopathy: Secondary | ICD-10-CM

## 2024-02-29 DIAGNOSIS — C7802 Secondary malignant neoplasm of left lung: Secondary | ICD-10-CM | POA: Diagnosis present

## 2024-02-29 DIAGNOSIS — C541 Malignant neoplasm of endometrium: Principal | ICD-10-CM | POA: Diagnosis present

## 2024-02-29 DIAGNOSIS — D630 Anemia in neoplastic disease: Secondary | ICD-10-CM

## 2024-02-29 DIAGNOSIS — E669 Obesity, unspecified: Secondary | ICD-10-CM | POA: Diagnosis present

## 2024-02-29 DIAGNOSIS — E871 Hypo-osmolality and hyponatremia: Secondary | ICD-10-CM | POA: Diagnosis present

## 2024-02-29 DIAGNOSIS — T451X5A Adverse effect of antineoplastic and immunosuppressive drugs, initial encounter: Secondary | ICD-10-CM | POA: Diagnosis present

## 2024-02-29 DIAGNOSIS — N179 Acute kidney failure, unspecified: Secondary | ICD-10-CM | POA: Diagnosis present

## 2024-02-29 DIAGNOSIS — K219 Gastro-esophageal reflux disease without esophagitis: Secondary | ICD-10-CM | POA: Diagnosis present

## 2024-02-29 DIAGNOSIS — R188 Other ascites: Secondary | ICD-10-CM | POA: Diagnosis present

## 2024-02-29 DIAGNOSIS — D61818 Other pancytopenia: Secondary | ICD-10-CM

## 2024-02-29 DIAGNOSIS — Z955 Presence of coronary angioplasty implant and graft: Secondary | ICD-10-CM

## 2024-02-29 DIAGNOSIS — D72829 Elevated white blood cell count, unspecified: Secondary | ICD-10-CM | POA: Diagnosis present

## 2024-02-29 DIAGNOSIS — Z1152 Encounter for screening for COVID-19: Secondary | ICD-10-CM

## 2024-02-29 DIAGNOSIS — C170 Malignant neoplasm of duodenum: Secondary | ICD-10-CM | POA: Diagnosis present

## 2024-02-29 DIAGNOSIS — R110 Nausea: Secondary | ICD-10-CM | POA: Diagnosis present

## 2024-02-29 DIAGNOSIS — E876 Hypokalemia: Principal | ICD-10-CM | POA: Diagnosis present

## 2024-02-29 DIAGNOSIS — I25119 Atherosclerotic heart disease of native coronary artery with unspecified angina pectoris: Secondary | ICD-10-CM

## 2024-02-29 LAB — CBC
HCT: 33.1 % — ABNORMAL LOW (ref 36.0–46.0)
Hemoglobin: 11 g/dL — ABNORMAL LOW (ref 12.0–15.0)
MCH: 32.5 pg (ref 26.0–34.0)
MCHC: 33.2 g/dL (ref 30.0–36.0)
MCV: 97.9 fL (ref 80.0–100.0)
Platelets: 443 K/uL — ABNORMAL HIGH (ref 150–400)
RBC: 3.38 MIL/uL — ABNORMAL LOW (ref 3.87–5.11)
RDW: 16.8 % — ABNORMAL HIGH (ref 11.5–15.5)
WBC: 18.6 K/uL — ABNORMAL HIGH (ref 4.0–10.5)
nRBC: 0 % (ref 0.0–0.2)

## 2024-02-29 LAB — LACTIC ACID, PLASMA: Lactic Acid, Venous: 1.7 mmol/L (ref 0.5–1.9)

## 2024-02-29 LAB — COMPREHENSIVE METABOLIC PANEL WITH GFR
ALT: 8 U/L (ref 0–44)
AST: 28 U/L (ref 15–41)
Albumin: 3.4 g/dL — ABNORMAL LOW (ref 3.5–5.0)
Alkaline Phosphatase: 58 U/L (ref 38–126)
Anion gap: 22 — ABNORMAL HIGH (ref 5–15)
BUN: 80 mg/dL — ABNORMAL HIGH (ref 6–20)
CO2: 25 mmol/L (ref 22–32)
Calcium: 9.5 mg/dL (ref 8.9–10.3)
Chloride: 83 mmol/L — ABNORMAL LOW (ref 98–111)
Creatinine, Ser: 1.37 mg/dL — ABNORMAL HIGH (ref 0.44–1.00)
GFR, Estimated: 44 mL/min — ABNORMAL LOW (ref >60)
Glucose, Bld: 162 mg/dL — ABNORMAL HIGH (ref 70–99)
Potassium: 2.6 mmol/L — CL (ref 3.5–5.1)
Sodium: 130 mmol/L — ABNORMAL LOW (ref 135–145)
Total Bilirubin: 1.5 mg/dL — ABNORMAL HIGH (ref 0.0–1.2)
Total Protein: 7 g/dL (ref 6.5–8.1)

## 2024-02-29 LAB — MAGNESIUM: Magnesium: 2.6 mg/dL — ABNORMAL HIGH (ref 1.7–2.4)

## 2024-02-29 LAB — TROPONIN I (HIGH SENSITIVITY): Troponin I (High Sensitivity): 17 ng/L (ref <18)

## 2024-02-29 MED ORDER — IOHEXOL 300 MG/ML  SOLN
100.0000 mL | Freq: Once | INTRAMUSCULAR | Status: AC | PRN
  Administered 2024-02-29: 100 mL via INTRAVENOUS

## 2024-02-29 MED ORDER — SODIUM CHLORIDE 0.9 % IV BOLUS
1000.0000 mL | Freq: Once | INTRAVENOUS | Status: AC
  Administered 2024-02-29: 1000 mL via INTRAVENOUS

## 2024-02-29 MED ORDER — MORPHINE SULFATE (PF) 4 MG/ML IV SOLN
4.0000 mg | Freq: Once | INTRAVENOUS | Status: AC
  Administered 2024-02-29: 4 mg via INTRAVENOUS
  Filled 2024-02-29: qty 1

## 2024-02-29 MED ORDER — POTASSIUM CHLORIDE 10 MEQ/100ML IV SOLN
10.0000 meq | Freq: Once | INTRAVENOUS | Status: AC
  Administered 2024-02-29: 10 meq via INTRAVENOUS
  Filled 2024-02-29: qty 100

## 2024-02-29 MED ORDER — ONDANSETRON HCL 4 MG/2ML IJ SOLN
4.0000 mg | Freq: Once | INTRAMUSCULAR | Status: AC
  Administered 2024-02-29: 4 mg via INTRAVENOUS
  Filled 2024-02-29: qty 2

## 2024-02-29 NOTE — Progress Notes (Signed)
 Gynecologic Oncology Consult Visit   Referring Provider: Oneil Gardiner Pinal, Dr Melanee  Chief Concern: Neuroendocrine cancer of uterus or cervix, advanced stage  Subjective:  Tonya [name] is a 58 y.o. with a past medical history significant for NSTEMI, HTN, T2DM, BMI 47, hypothyroidism who is seen in consultation at the request of Oneil Gardiner Pinal, MD for uterine cancer with extensive nodal, peritoneal and lung metastasis.  Dr. Melanee discussed Ms. Kuechle with me. Unfortunately, scans from 02/23/2024 show worsening metastatic disease as evidenced by increasing size of bilateral lung nodules as well as intra-abdominal adenopathy as well as peritoneal and omental nodularity.  We discussed possible treatment options and based on [facility] topotecan/paclitaxel/bevacizumab would be an option, however, based on what Dr. Melanee has shared regarding her overall health and performance status I don't think she can tolerate this therapy. She also has symptoms of bowel dysfunction bevacizumab should be avoided.   However I am seeing the patient in clinic today and she has multiple medical issues and concerning symptoms.  Her symptoms include 7 to 10 days of nausea with emesis she is not able to keep anything down.  She complains of fatigue, weakness, shortness of breath, abdominal bloating with distention, decreased appetite, constipation as she is not able to take MiraLAX  or Colace.  Her last bowel movement was 5 days ago.  She also complains of leg swelling.   Gyn Oncology History Patient states she was initially was on ozempic and transitioned to monjaro during which she developed worsening constipation and cramping abdominal pain prompting imaging. CT 09/17/23 demonstrated uterine mass with impingement on the distal ureters and metastasis to the lungs, peritoneal cavity and retroperitoneal lymph nodes. Occasional vaginal spotting. Last pap smear was over 10 years ago, had an abnormal pap smear in her early 40s, had  cryotherapy. States she had not had abnormal pap smears since.   CT ABDOMEN AND PELVIS WITH CONTRAST 08/29/23 FINDINGS:  Lower chest: Multiple tiny bilateral pulmonary nodules. Example left  lower lobe at 4 mm on 16/4.   3 mm on the right lower lobe on 20/4.   Retrocrural adenopathy at 1.0 cm on 19/2. Prominent but not  pathologically sized lower mediastinal nodes are incompletely  imaged. Mild cardiomegaly with multivessel coronary artery  calcification.   Hepatobiliary: Mild caudate and lateral segment left liver lobe  enlargement. Normal gallbladder, without biliary ductal dilatation.   Pancreas: Normal, without mass or ductal dilatation.   Spleen: Normal in size, without focal abnormality.   Adrenals/Urinary Tract: Normal adrenal glands. Mild bilateral  hydronephrosis and proximal hydroureter. The ureters are somewhat  challenging to follow, but no distal hydroureter is seen.   The bladder is mildly thick walled with pericystic edema.   Stomach/Bowel: Normal stomach, without wall thickening. Scattered  colonic diverticula. Normal terminal ileum and appendix. Normal  small bowel.   Vascular/Lymphatic: Advanced aortic and branch vessel  atherosclerosis. Extensive abdominal retroperitoneal adenopathy.  Retrocaval index node measures 1.5 cm at 34/2.   Gastrohepatic ligament node measures 1.5 cm in 25/2.   Pelvic adenopathy with a left external iliac 1.3 cm node on 67/2.   Reproductive: Central uterine enlargement at 1.8 cm on sagittal  image 82/6. Suggestion of soft tissue fullness within the lower  uterine segment and cervix including on 69/2. Periuterine  interstitial thickening.No adnexal mass.   Other: No abdominal ascites or free intraperitoneal air. Omental  nodularity including at 9 mm on 31/2. Peritoneal thickening  including of the left lateral conal fascia on 37/2.  Musculoskeletal: No acute osseous abnormality.   IMPRESSION:  1. Widespread metastatic  disease, including to abdominopelvic and  lower thoracic nodes, omentum, and lungs as detailed above.  2. Abnormal appearance of the uterus for age. Favor endometrial or  cervical sites of primary.  3. Mild bilateral hydronephrosis and proximal hydroureter. Given  absence of distal hydroureter, favor involvement by uterine or  cervical primary.  4. Bladder wall thickening and pericystic edema for which cystitis  cannot be excluded.  5. No bowel obstruction or other acute complication  6. Hepatic morphology suspicious for mild cirrhosis.  7. Aortic atherosclerosis (ICD10-I70.0), coronary artery  atherosclerosis and emphysema (ICD10-J43.9).   Seen by Gyn Oncology 09/17/[facility] at [facility]. Narrow vaginal canal tapering at the apex with induration anteriorly concerning for malignancy.  Cervical biopsy was consistent with squamous mucosa and abundant necrotic debris highly suspicious but not definitively diagnostic for malignancy.   09/13/23 Biopsy of retroperitoneal lymph node Poorly differentiated carcinoma with neuroendocrine features.  A1-14  PanCK                      positive  A1-15  P16                           Positive, patchy A1-16  P53                           Normal/wild-type  A1-17  PAX8                        Focal blush positive  A1-18  ER Dx Only              negative  A1-19  Bio Neg Ctrl              Appropriately reactive control slide  A1-22  CK7                          Positive, patchy A1-23  CK20                        negative  A1-24  GATA 3                    negative  A1-25  CDX-2                      Positive, patchy A1-26  TTF-1                        negative  A1-27  INSM-1                     Positive, patchy weak A1-28SynaptophysinPositive, patchy weak A1-31  BRG-1                      Favor retained (weak) nuclear staining A1-32  BAF 47                     Retained nuclear staining A1-33  P63                           negative  A1-34  Claudin-4  Positive, patchy     MMR IHC intact, MSS  Decision made to treat her with carboplatin /etoposide  systemic therapy in view of extensive metastatic disease with pelvic, para-aortic, and thoracic lymphadenopathy, lung and peritoneal involvement, and mild hydronephrosis consistent with stage IV disease.  11/14/23 Dr Melanee Given that patient had hospitalization both times after her chemotherapy due to C diff and febrile neutropenia and given the significant anemia requiring blood transfusions, I am reducing carboplatin  dosed to AUC 4.  Patient received about 540 mg of carboplatin  with second cycle and will get up to 400 mg with cycle 3.  Etoposide  will be given at 100 mg/m on all 3 days.    11/2023 She has completed 3 cycles of carbo/VP16 with Dr Melanee.  CT scan today shows interval improvement with shrinkage of all disease.  CT scan 11/25/23 Periesophageal lymph nodes have decreased in size, now measuring up to 8 mm (2/25), previously 2.1 cm. Retrocrural lymph nodes have decreased in size as well, measuring up to 9 mm (2/45), previously 1.5 cm.  Vascular/Lymphatic: Periportal, gastrohepatic ligament and abdominopelvic retroperitoneal adenopathy has improved somewhat in the interval. Index left periaortic lymph node measures 12 mm (2/69), previously 1.8 cm. Index gastrohepatic ligament lymph node measures 10 mm, previously 1.8 cm.  Other: Moderate ascites. Omental nodularity has improved somewhat in the interval, now measuring up to 8 mm (2/66), previously 13 mm.   IMPRESSION: 1. Interval response to therapy as evidenced by decrease in size of thoracic, abdominal and pelvic lymph adenopathy as well as improved omental metastatic disease. 2. Moderate ascites. 3. Age advanced three-vessel coronary artery calcification. 4.  Aortic atherosclerosis (ICD10-I70.0). 5. Enlarged pulmonic trunk, indicative of pulmonary arterial hypertension.       Sister had breast cancer - dx around 43 Mother had stage IV  colon cancer - dx around 21  Do any family members have a history of DVT/PE: no    Past Gynecologic History:  Menopause: around age 1 Last Menstrual Period: 8 years ago  History of OCP/HRT use: no  History of Abnormal pap: yes, s/p cryotherapy in early 81s Last pap: over 10 years ago  HPV vaccine: no   Problem List: Patient Active Problem List   Diagnosis Date Noted   Goals of care, counseling/discussion 02/28/2024   Neutropenia, febrile (HCC) 11/04/2023   Actinomyces infection 10/12/2023   Closed compression fracture of body of L1 vertebra (HCC) 10/11/2023   Thrombocytopenia (HCC) 10/10/2023   Bacteremia due to Streptococcus 10/05/2023   Hypophosphatemia 10/04/2023   AKI (acute kidney injury) (HCC) 10/03/2023   Hyponatremia 10/03/2023   Metabolic acidosis 10/03/2023   Neutropenia (HCC) 10/03/2023   C. 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) 09/21/2023   Endometrial carcinoma (HCC) 09/21/2023   Malignant neoplasm of uterus (HCC) 09/01/2023   B12 deficiency 05/25/2023   Vitamin D deficiency 05/25/2023   Coronary artery disease involving native coronary artery of native heart with angina pectoris (HCC) 07/24/2022   Type 2 diabetes mellitus with peripheral angiopathy (HCC) 05/18/2022   Ischemic cardiomyopathy 08/09/2019   Essential hypertension 08/09/2019   Hyperlipidemia associated with type 2 diabetes mellitus (HCC) 08/09/2019   Morbid obesity (HCC) 08/09/2019   Diabetes mellitus (HCC) 08/09/2019   NSTEMI (non-ST elevated myocardial infarction) (HCC) 07/23/2019   Hypothyroidism (acquired) 07/23/2019    Past Medical History: Past Medical History:  Diagnosis Date   Cancer (HCC)  Coronary artery disease    GERD (gastroesophageal reflux disease)    Hypertension    Hypothyroidism    Myocardial infarction (HCC)    Thyroid  disease      Past Surgical History: Past Surgical History:  Procedure Laterality Date   CARDIAC CATHETERIZATION     CESAREAN SECTION     CORONARY STENT INTERVENTION N/A 07/24/2019   Procedure: CORONARY STENT INTERVENTION;  Surgeon: Mady Bruckner, MD;  Location: [facility] INVASIVE CV LAB;  Service: Cardiovascular;  Laterality: N/A;  RCA & CFX   IR IMAGING GUIDED PORT INSERTION  09/23/2023   LEFT HEART CATH AND CORONARY ANGIOGRAPHY N/A 07/24/2019   Procedure: LEFT HEART CATH AND CORONARY ANGIOGRAPHY;  Surgeon: Hester Wolm PARAS, MD;  Location: [facility] INVASIVE CV LAB;  Service: Cardiovascular;  Laterality: N/A;   TONSILLECTOMY      OB History:  OB History  No obstetric history on file.    Family History: Family History  Problem Relation Age of Onset   Atrial fibrillation Mother        Passed January 2024.   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[employer])  Tobacco Use   Smoking status: Never   Smokeless tobacco: Never  Vaping Use   Vaping status: Never Used  Substance and Sexual Activity   Alcohol use: Not Currently    Comment: occ   Drug use: Never   Sexual activity: Not Currently  Other Topics Concern   Not on file  Social History Narrative   Not on file   Social Drivers of Health   Financial Resource Strain: Low Risk  (07/23/2019)   Overall Financial Resource Strain (CARDIA)    Difficulty of Paying Living Expenses: Not hard at all  Food Insecurity: No Food Insecurity (11/05/2023)   Hunger Vital Sign    Worried About Running Out of Food in the Last Year: Never true    Ran Out of Food in the Last Year: Never true  Transportation Needs: No  Transportation Needs (11/05/2023)   PRAPARE - Administrator, Civil Service (Medical): No    Lack of Transportation (Non-Medical): No  Physical Activity: Unknown (07/23/2019)   Exercise Vital Sign    Days of Exercise per Week: 2 days    Minutes of Exercise per Session: Not on file  Stress: No Stress Concern Present (07/23/2019)   [employer] of Occupational Health - Occupational Stress Questionnaire    Feeling of Stress : Not at all  Social Connections: Moderately Integrated (10/11/2023)   Social Connection and Isolation Panel    Frequency of Communication with Friends and Family: More than three times a week    Frequency of Social Gatherings with Friends and Family: Twice a week    Attends Religious Services: 1 to 4 times per year    Active Member of [employer] or Organizations: No    Attends Banker Meetings: Never    Marital Status: Married  Catering manager Violence: Not At Risk (11/05/2023)   Humiliation, Afraid, Rape, and Kick questionnaire    Fear of  Current or Ex-Partner: No    Emotionally Abused: No    Physically Abused: No    Sexually Abused: No    Allergies: No Known Allergies  Current Medications: Current Outpatient Medications  Medication Sig Dispense Refill   dexamethasone  (DECADRON ) 2 MG tablet Take 1 tablet (2 mg total) by mouth daily. 5 tablet 0   pantoprazole  (PROTONIX ) 20 MG tablet Take 1 tablet (20 mg total) by mouth daily. For acid reflux 30 tablet 2   thyroid  (ARMOUR) 120 MG tablet Take 120 mg by mouth daily before breakfast.     Cholecalciferol (VITAMIN D-1000 MAX ST) 25 MCG (1000 UT) tablet Take 1,000 Units by mouth daily. (Patient not taking: Reported on 02/29/2024)     losartan -hydrochlorothiazide (HYZAAR) 50-12.5 MG tablet Take 1 tablet by mouth daily. (Patient not taking: Reported on 02/29/2024)     metoprolol  tartrate (LOPRESSOR ) 25 MG tablet Take 0.5 tablets (12.5 mg total) by mouth 2 (two) times daily. (Patient not taking:  Reported on 02/29/2024) 15 tablet 0   Multiple Vitamin (MULTI-VITAMIN) tablet Take 1 tablet by mouth daily. (Patient not taking: Reported on 02/29/2024)     Nutritional Supplements (ENSURE HIGH PROTEIN) LIQD 237 mL 2 TIMES DAILY (route: oral) (Patient not taking: Reported on 02/29/2024)     nystatin  cream (MYCOSTATIN ) Apply topically 2 (two) times daily. (Patient not taking: Reported on 02/29/2024) 30 g 0   potassium chloride  (KLOR-CON  M) 10 MEQ tablet Take 2 tablets (20 mEq total) by mouth 2 (two) times daily. (Patient not taking: Reported on 02/29/2024) 120 tablet 1   sodium chloride  1 g tablet Take 1 tablet (1 g total) by mouth 2 (two) times daily with a meal. (Patient not taking: Reported on 02/29/2024) 60 tablet 1   Vitamin D, Ergocalciferol, (DRISDOL) 1.25 MG (50000 UNIT) CAPS capsule Take 50,000 Units by mouth once a week. (Patient not taking: Reported on 02/29/2024)     No current facility-administered medications for this visit.    Review of systems: 12 point review of systems as per interval history  Objective:  Physical Examination:  BP 98/62   Pulse (!) 102   Temp 98.6 F (37 C)   Resp 20   Wt 180 lb (81.6 kg)   LMP 07/22/2016   SpO2 100%   BMI 30.90 kg/m    ECOG Performance Status: 2 - Symptomatic, <50% confined to bed  GENERAL: Pale feeble appearing female in no acute distress. She is in a wheelchair.  HEENT:  PERRL, atraumatic normocephalic  NODES:  No cervical, supraclavicular, axillary, or inguinal lymphadenopathy palpated.  LUNGS: Normal respiratory effort HEART: Tachycardia is noted based on vital signs ABDOMEN:  Soft, slightly distended, nontender.  Tympanic in the right upper quadrant positive.  Very firm in the left upper quadrant EXTREMITIES:  Peripheral edema R>L NEURO:  Nonfocal. Well oriented.  Appropriate affect.  Pelvic:  deferred  Lab Review Labs on site today: Lab Results  Component Value Date   WBC 10.9 (H) 02/22/2024   HGB 10.3 (L) 02/22/2024   HCT 30.5  (L) 02/22/2024   MCV 97.8 02/22/2024   PLT 450 (H) 02/22/2024     Chemistry      Component Value Date/Time   NA 128 (L) 02/22/2024 1213   K 3.3 (L) 02/22/2024 1213   CL 86 (L) 02/22/2024 1213   CO2 26 02/22/2024 1213   BUN 30 (H) 02/22/2024 1213   CREATININE 0.72 02/22/2024 1213      Component Value Date/Time   CALCIUM  9.8  02/22/2024 1213   ALKPHOS 58 02/13/2024 1438   AST 21 02/13/2024 1438   ALT 11 02/13/2024 1438   BILITOT 1.0 02/13/2024 1438      Radiology  CT CHEST ABDOMEN PELVIS W CONTRAST CLINICAL DATA:  Metastatic endometrial cancer, ongoing chemotherapy * Tracking Code: BO *  EXAM: CT CHEST, ABDOMEN, AND PELVIS WITH CONTRAST  TECHNIQUE: Multidetector CT imaging of the chest, abdomen and pelvis was performed following the standard protocol during bolus administration of intravenous contrast.  RADIATION DOSE REDUCTION: This exam was performed according to the departmental dose-optimization program which includes automated exposure control, adjustment of the mA and/or kV according to patient size and/or use of iterative reconstruction technique.  CONTRAST:  OMNIPAQUE  IOHEXOL  300 MG/ML  SOLN  COMPARISON:  11/25/2023  FINDINGS: CT CHEST FINDINGS  Cardiovascular: Right chest port catheter. Scattered aortic atherosclerosis. Normal heart size. Three-vessel coronary artery calcifications. No pericardial effusion.  Mediastinum/Nodes: Newly enlarged left axillary and subpectoral lymph nodes measuring up to 2.1 x 1.6 cm (series 2, image 12). Thyroid  gland, trachea, and esophagus demonstrate no significant findings.  Lungs/Pleura: Interval resolution of previously seen left pleural effusion. Multiple new and enlarged bilateral pulmonary nodules, for example a 0.6 cm nodule of the anterior right lower lobe, previously no greater than 0.2 cm (series 3, image 88), and a new nodule in the posterior left upper lobe measuring 0.7 cm (series 3, image  54).  Musculoskeletal: No chest wall abnormality. No acute osseous findings.  CT ABDOMEN PELVIS FINDINGS  Hepatobiliary: No solid liver abnormality is seen. No gallstones, gallbladder wall thickening, or biliary dilatation.  Pancreas: Unremarkable. No pancreatic ductal dilatation or surrounding inflammatory changes.  Spleen: Normal in size without significant abnormality.  Adrenals/Urinary Tract: Adrenal glands are unremarkable. Kidneys are normal, without renal calculi, solid lesion, or hydronephrosis. New, enhancing nodular soft tissue along the posterior right aspect of the bladder dome measuring approximately 3.3 x 1.3 cm (series 2, image 101).  Stomach/Bowel: Mucosal thickening and hyperenhancement of the stomach (series 2, image 61). Appendix appears normal. No evidence of bowel wall thickening, distention, or inflammatory changes.  Vascular/Lymphatic: Aortic atherosclerosis. Interval enlargement of numerous abdominal and pelvic lymph nodes, involving the gastrohepatic ligament, celiac, porta hepatis, portacaval, retroperitoneal, bilateral iliac, and pelvic sidewall stations. Many lymph nodes are matted, particularly in the retroperitoneum and iliac stations. Largest index left iliac nodes measure up to 3.1 x 2.1 cm, previously on measurable (series 2, image 71). Largest portacaval nodes measure up to 2.9 x 1.9 cm, previously 1.3 x 0.9 cm (series 2, image 57).  Reproductive: Matted appearance of the uterus and ovaries, similar to prior examination (series 2, image 92).  Other: No abdominal wall hernia or abnormality. Small volume ascites throughout the abdomen and pelvis, similar in volume to prior examination. Interval increase in very fine peritoneal and omental nodularity in the ventral abdomen (series 2, image 72).  Musculoskeletal: No acute osseous findings. Unchanged high-grade wedge deformity of L1 (series 6, image 94).  IMPRESSION: 1. Interval enlargement  of numerous lymph nodes throughout the chest, abdomen, and pelvis, consistent with worsened nodal metastatic disease. 2. Multiple new and enlarged bilateral pulmonary nodules, consistent with pulmonary metastatic disease. 3. Small volume ascites throughout the abdomen pelvis, similar to prior examination. Interval increase in fine peritoneal and omental nodularity in the ventral abdomen as well as new, enhancing nodular soft tissue along the posterior right aspect of the bladder dome consistent with worsened peritoneal metastatic disease. 4. Matted post treatment appearance of the  uterus and ovaries, similar to prior examination. 5. Mucosal thickening and hyperenhancement of the stomach, suggestive of nonspecific infectious or inflammatory gastritis. 6. Interval resolution of previously seen left pleural effusion. 7. Coronary artery disease.  Aortic Atherosclerosis (ICD10-I70.0).  Electronically Signed   By: Marolyn JONETTA Jaksch M.D.   On: 02/24/2024 16:28    Assessment:  Cintya Daughety is a 58 y.o. 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   MMR IHC intact, MSS  Failure to thrive  Intractable nausea/emesis, suspect Malignant bowel obstruction. 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  Given her performance status  she is not a candidate for chemotherapy at this time.  We did discuss hospice however she is not ready to make a decision regarding that at this time  I have recommended hospital admission for management of intractable nausea and vomiting.  I am concerned about malignant bowel obstruction and given her history and the images potential for gastric outlet obstruction.  Dr. Jaksch has been notified and we are waiting for follow-up of the imaging assessment.  In the interim bowel rest, IV antiemetics and supplementation of electrolytes.  I do not think she needs an NG tube at this time  Consider RLE doppler to assess for DVT  Palliative care consultation   We reviewed that her cancer is most likely not curable, prognosis is poor, and that symptom management is important to improve her quality life. Hopefully during this admission we can specifically reduce her symptoms which are quite debilitating, and maximize quality of life. We reviewed Hospice, DNAR, and HCPOA.  As noted above she is not ready to make a decision regarding hospice.  She is also not ready to make a decision regarding resuscitation.  She does not have healthcare power of attorney papers however her husband is her healthcare power of attorney.  She will make decisions if she wants an additional person listed.  These discussions will be ongoing with the medical oncology team.   The patient's diagnosis, an outline of the further diagnostic and laboratory studies which will be required, the recommendation, and alternatives were discussed.  All questions were answered hopefully to the patient's satisfaction.  I know this is very hard information for her and her husband to hear  Vina Byrd Isidor Constable, MD

## 2024-02-29 NOTE — Progress Notes (Signed)
 I have personally performed a a face to face evaluation of this patient and I agree with the history and physical and assessment as documented by NP Tinnie Dawn.  I have reviewedchest abdomen pelvis imaging independently and discussed findings with the patient.  After 3 cycles of carbo etoposide  chemotherapy patient had interval response to treatment based on scans in April 2025.  However scans from 02/23/2024 show worsening metastatic disease as evidenced by increasing size of bilateral lung nodules as well as intra-abdominal adenopathy as well as peritoneal and omental nodularity.  Overall this portends a poor prognosis.  Patient is more symptomatic at this time.  Appetite is poor and she is more fatigued.  We discussed the following options moving forward  1.  I have strongly encouraged patient to involve her husband and her healthcare decision making and keep him updated about her health condition.  I have encouraged her to think about CODE STATUS.  Patient understands that surgery is not an option for her at this time.  2.  We discussed attempting second line chemotherapy versus consideration for hospice.  Patient would like to try active treatments and does not wish to proceed with hospice at this time.  3.  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  She received first-line chemotherapy with carbo etoposide  covering neuroendocrine carcinoma.  Second line options include single agent topotecan which would cover neuroendocrine etiology as well as one of the acceptable options for endometrial carcinoma for later lines of treatment.  I will also await further recommendations from Dr. Elby.  We are sending off NGS testing to see if there are any actionable mutations but given how symptomatic the patient is presently we may need to start treatment even before NGS testing results are back  4.  I also  discussed the possibility of developing a complication like malignant bowel obstruction given the burden of disease in the abdomen and peritoneal carcinomatosis.  Presently patient is able to keep liquids down and is passing gas.  If she were to develop bowel obstruction she may need a palliative venting PEG and consideration of hospice at that time.  Dr. Annah Skene, MD, MPH [facility] [phone] 02/29/2024 7:33 AM

## 2024-02-29 NOTE — ED Triage Notes (Signed)
 [facility] and [facility]; had syncopal episode on Monday. Has not been able to continue with chemo due to symptoms.

## 2024-02-29 NOTE — ED Notes (Signed)
 [place] at the bedside

## 2024-02-29 NOTE — ED Provider Notes (Signed)
 [facility] Provider Note    Event Date/Time   First MD Initiated Contact with Patient 02/29/24 2015     (approximate)   History   Weakness   HPI  Tonya [name] is a 58 y.o. female with a history of CAD, NSTEMI, hypertension, type 2 diabetes, hypothyroidism, and metastatic uterine cancer who presents with persistent nausea and vomiting over the last 2 weeks.  The patient states that the vomiting has subsided somewhat in the last 2 days, but she is still very nauseous and and unable to take anything by mouth.  She states that she has been constipated and her last bowel movement was about a week ago.  She reports some abdominal pain.  She feels very weak.  She was sent in from outpatient oncology for admission.  I reviewed the past medical records.  The patient's most recent outpatient counter was earlier today with gynecologic oncology.  She was determined not to be a candidate for chemotherapy at this time.  She was recommended to come to the hospital for management of intractable nausea and vomiting, with bowel rest, IV antiemetics, and electrolyte supplementation.  She also had new right leg swelling and a DVT study was planned.   Physical Exam   Triage Vital Signs: ED Triage Vitals [02/29/24 1550]  Encounter Vitals Group     BP 103/74     Girls Systolic BP Percentile      Girls Diastolic BP Percentile      Boys Systolic BP Percentile      Boys Diastolic BP Percentile      Pulse Rate (!) 110     Resp 20     Temp 97.8 F (36.6 C)     Temp Source Oral     SpO2 99 %     Weight 180 lb (81.6 kg)     Height 5' 4 (1.626 m)     Head Circumference      Peak Flow      Pain Score      Pain Loc      Pain Education      Exclude from Growth Chart     Most recent vital signs: Vitals:   02/29/24 2130 02/29/24 2230  BP: 120/76 117/71  Pulse: 92 88  Resp: (!) 22 20  Temp:    SpO2: 97% 97%     General: Alert, weak appearing, no distress.  CV:  Good  peripheral perfusion.  Resp:  Normal effort.  Abd:  Mild distention.  Mild discomfort to palpation with no focal tenderness. Other:  No jaundice or scleral icterus.  Mucous membranes.   ED Results / Procedures / Treatments   Labs (all labs ordered are listed, but only abnormal results are displayed) Labs Reviewed  COMPREHENSIVE METABOLIC PANEL WITH GFR - Abnormal; Notable for the following components:      Result Value   Sodium 130 (*)    Potassium 2.6 (*)    Chloride 83 (*)    Glucose, Bld 162 (*)    BUN 80 (*)    Creatinine, Ser 1.37 (*)    Albumin 3.4 (*)    Total Bilirubin 1.5 (*)    GFR, Estimated 44 (*)    Anion gap 22 (*)    All other components within normal limits  CBC - Abnormal; Notable for the following components:   WBC 18.6 (*)    RBC 3.38 (*)    Hemoglobin 11.0 (*)    HCT 33.1 (*)  RDW 16.8 (*)    Platelets 443 (*)    All other components within normal limits  LACTIC ACID, PLASMA  URINALYSIS, ROUTINE W REFLEX MICROSCOPIC  LACTIC ACID, PLASMA  MAGNESIUM   TROPONIN I (HIGH SENSITIVITY)     EKG  ED ECG REPORT I, Waylon Cassis, the attending physician, personally viewed and interpreted this ECG.  Date: 02/29/2024 EKG Time: 1610 Rate: 106 Rhythm: normal sinus rhythm QRS Axis: normal Intervals: normal ST/T Wave abnormalities: Nonspecific ST abnormality Narrative Interpretation: no evidence of acute ischemia    RADIOLOGY  US  venous RLE: No acute DVT  CT ab/pelvis: Pending  PROCEDURES:  Critical Care performed: No  Procedures   MEDICATIONS ORDERED IN ED: Medications  potassium chloride  10 mEq in 100 mL IVPB (10 mEq Intravenous New Bag/Given 02/29/24 2238)  potassium chloride  10 mEq in 100 mL IVPB (10 mEq Intravenous New Bag/Given 02/29/24 2238)  sodium chloride  0.9 % bolus 1,000 mL (0 mLs Intravenous Stopped 02/29/24 2142)  ondansetron  (ZOFRAN ) injection 4 mg (4 mg Intravenous Given 02/29/24 2042)  morphine  (PF) 4 MG/ML injection 4 mg (4  mg Intravenous Given 02/29/24 2233)  iohexol  (OMNIPAQUE ) 300 MG/ML solution 100 mL (100 mLs Intravenous Contrast Given 02/29/24 2244)     IMPRESSION / MDM / ASSESSMENT AND PLAN / ED COURSE  I reviewed the triage vital signs and the nursing notes.  58 year old female with PMH as noted above presents with intractable nausea, weakness, inability tolerate p.o. over the last 2 weeks.  Her vital signs are normal.  She has no focal abdominal tenderness.  Differential diagnosis includes, but is not limited to, side effect of chemotherapy, dehydration, electrolyte abnormality, other metabolic cause, symptoms directly related to the metastatic cancer.  We will obtain lab workup, give fluids, antiemetics, and reassess.  Patient's presentation is most consistent with acute presentation with potential threat to life or bodily function.  The patient is on the cardiac monitor to evaluate for evidence of arrhythmia and/or significant heart rate changes.  ----------------------------------------- 10:54 PM on 02/29/2024 -----------------------------------------  Lab workup is significant for potassium of 2.6.  The anion gap is elevated at 22 as well concerning for metabolic acidosis.  CBC shows a WBC count of 18.  Right lower extremity ultrasound is negative.  The patient is now reporting abdominal pain.  Given the elevated WBC count and the pain I have ordered a CT for further evaluation.  ----------------------------------------- 11:15 PM on 02/29/2024 -----------------------------------------  CT is pending.  Patient will be signed out to the oncoming ED physician Dr. Neomi.   FINAL CLINICAL IMPRESSION(S) / ED DIAGNOSES   Final diagnoses:  Hypokalemia  Intractable nausea     Rx / DC Orders   ED Discharge Orders     None        Note:  This document was prepared using Dragon voice recognition software and may include unintentional dictation errors.    Cassis Waylon, MD 02/29/24  [phone]

## 2024-02-29 NOTE — ED Notes (Signed)
 Patient requesting for port to be accessed for blood work

## 2024-02-29 NOTE — ED Notes (Signed)
 ED Provider at bedside.

## 2024-02-29 NOTE — Progress Notes (Signed)
 Pharmacist Chemotherapy Monitoring - Initial Assessment    Anticipated start date: 03/13/24  The following has been reviewed per standard work regarding the patient's treatment regimen: The patient's diagnosis, treatment plan and drug doses, and organ/hematologic function Lab orders and baseline tests specific to treatment regimen  The treatment plan start date, drug sequencing, and pre-medications Prior authorization status  Patient's documented medication list, including drug-drug interaction screen and prescriptions for anti-emetics and supportive care specific to the treatment regimen The drug concentrations, fluid compatibility, administration routes, and timing of the medications to be used The patient's access for treatment and lifetime cumulative dose history, if applicable  The patient's medication allergies and previous infusion related reactions, if applicable   Changes made to treatment plan:  Second line options of single agent topotecan which would cover neuroendocrine etiology as well as one of the acceptable options for endometrial carcinoma for later lines of treatment  Stop carbo etoposide  and begin topotecan 2.4 mg daily x 5 days  Follow up needed:  Check prior auth pending   Redell JINNY Gaskins, [place], 02/29/2024  3:40 PM

## 2024-03-01 ENCOUNTER — Inpatient Hospital Stay: Admitting: General Practice

## 2024-03-01 ENCOUNTER — Observation Stay

## 2024-03-01 ENCOUNTER — Encounter
Admission: EM | Disposition: A | Discharge status: Home Health | Payer: Self-pay | Source: Ambulatory Visit | Attending: Internal Medicine

## 2024-03-01 DIAGNOSIS — C7A1 Malignant poorly differentiated neuroendocrine tumors: Secondary | ICD-10-CM | POA: Diagnosis not present

## 2024-03-01 DIAGNOSIS — E860 Dehydration: Secondary | ICD-10-CM | POA: Diagnosis present

## 2024-03-01 DIAGNOSIS — C541 Malignant neoplasm of endometrium: Secondary | ICD-10-CM | POA: Diagnosis present

## 2024-03-01 DIAGNOSIS — Z1152 Encounter for screening for COVID-19: Secondary | ICD-10-CM | POA: Diagnosis not present

## 2024-03-01 DIAGNOSIS — E871 Hypo-osmolality and hyponatremia: Secondary | ICD-10-CM | POA: Diagnosis present

## 2024-03-01 DIAGNOSIS — R188 Other ascites: Secondary | ICD-10-CM

## 2024-03-01 DIAGNOSIS — R933 Abnormal findings on diagnostic imaging of other parts of digestive tract: Secondary | ICD-10-CM

## 2024-03-01 DIAGNOSIS — N179 Acute kidney failure, unspecified: Secondary | ICD-10-CM | POA: Diagnosis present

## 2024-03-01 DIAGNOSIS — C170 Malignant neoplasm of duodenum: Secondary | ICD-10-CM | POA: Diagnosis present

## 2024-03-01 DIAGNOSIS — R110 Nausea: Secondary | ICD-10-CM | POA: Diagnosis present

## 2024-03-01 DIAGNOSIS — C7802 Secondary malignant neoplasm of left lung: Secondary | ICD-10-CM | POA: Diagnosis present

## 2024-03-01 DIAGNOSIS — R112 Nausea with vomiting, unspecified: Secondary | ICD-10-CM

## 2024-03-01 DIAGNOSIS — C7801 Secondary malignant neoplasm of right lung: Secondary | ICD-10-CM | POA: Diagnosis present

## 2024-03-01 DIAGNOSIS — R9431 Abnormal electrocardiogram [ECG] [EKG]: Secondary | ICD-10-CM | POA: Diagnosis not present

## 2024-03-01 DIAGNOSIS — C169 Malignant neoplasm of stomach, unspecified: Secondary | ICD-10-CM | POA: Diagnosis present

## 2024-03-01 DIAGNOSIS — I5042 Chronic combined systolic (congestive) and diastolic (congestive) heart failure: Secondary | ICD-10-CM | POA: Diagnosis present

## 2024-03-01 DIAGNOSIS — E039 Hypothyroidism, unspecified: Secondary | ICD-10-CM | POA: Diagnosis present

## 2024-03-01 DIAGNOSIS — E1165 Type 2 diabetes mellitus with hyperglycemia: Secondary | ICD-10-CM | POA: Diagnosis present

## 2024-03-01 DIAGNOSIS — D638 Anemia in other chronic diseases classified elsewhere: Secondary | ICD-10-CM | POA: Diagnosis present

## 2024-03-01 DIAGNOSIS — I2510 Atherosclerotic heart disease of native coronary artery without angina pectoris: Secondary | ICD-10-CM | POA: Diagnosis present

## 2024-03-01 DIAGNOSIS — E876 Hypokalemia: Secondary | ICD-10-CM | POA: Diagnosis present

## 2024-03-01 DIAGNOSIS — D72829 Elevated white blood cell count, unspecified: Secondary | ICD-10-CM | POA: Diagnosis present

## 2024-03-01 DIAGNOSIS — I110 Hypertensive heart disease with heart failure: Secondary | ICD-10-CM | POA: Diagnosis present

## 2024-03-01 DIAGNOSIS — Z515 Encounter for palliative care: Secondary | ICD-10-CM | POA: Diagnosis not present

## 2024-03-01 DIAGNOSIS — K3189 Other diseases of stomach and duodenum: Secondary | ICD-10-CM

## 2024-03-01 DIAGNOSIS — E1151 Type 2 diabetes mellitus with diabetic peripheral angiopathy without gangrene: Secondary | ICD-10-CM | POA: Diagnosis present

## 2024-03-01 DIAGNOSIS — I5022 Chronic systolic (congestive) heart failure: Secondary | ICD-10-CM | POA: Diagnosis not present

## 2024-03-01 DIAGNOSIS — E669 Obesity, unspecified: Secondary | ICD-10-CM | POA: Diagnosis present

## 2024-03-01 DIAGNOSIS — E43 Unspecified severe protein-calorie malnutrition: Secondary | ICD-10-CM | POA: Diagnosis present

## 2024-03-01 DIAGNOSIS — Z8249 Family history of ischemic heart disease and other diseases of the circulatory system: Secondary | ICD-10-CM | POA: Diagnosis not present

## 2024-03-01 DIAGNOSIS — C786 Secondary malignant neoplasm of retroperitoneum and peritoneum: Secondary | ICD-10-CM | POA: Diagnosis present

## 2024-03-01 HISTORY — PX: ESOPHAGOGASTRODUODENOSCOPY: SHX5428

## 2024-03-01 LAB — BASIC METABOLIC PANEL WITH GFR
Anion gap: 19 — ABNORMAL HIGH (ref 5–15)
BUN: 73 mg/dL — ABNORMAL HIGH (ref 6–20)
CO2: 29 mmol/L (ref 22–32)
Calcium: 9.1 mg/dL (ref 8.9–10.3)
Chloride: 82 mmol/L — ABNORMAL LOW (ref 98–111)
Creatinine, Ser: 1.43 mg/dL — ABNORMAL HIGH (ref 0.44–1.00)
GFR, Estimated: 42 mL/min — ABNORMAL LOW (ref >60)
Glucose, Bld: 118 mg/dL — ABNORMAL HIGH (ref 70–99)
Potassium: 2.8 mmol/L — ABNORMAL LOW (ref 3.5–5.1)
Sodium: 130 mmol/L — ABNORMAL LOW (ref 135–145)

## 2024-03-01 LAB — PHOSPHORUS: Phosphorus: 3 mg/dL (ref 2.5–4.6)

## 2024-03-01 SURGERY — EGD (ESOPHAGOGASTRODUODENOSCOPY)
Anesthesia: General

## 2024-03-01 MED ORDER — EPHEDRINE SULFATE-NACL 50-0.9 MG/10ML-% IV SOSY
PREFILLED_SYRINGE | INTRAVENOUS | Status: DC | PRN
  Administered 2024-03-01: 10 mg via INTRAVENOUS

## 2024-03-01 MED ORDER — DEXAMETHASONE SODIUM PHOSPHATE 10 MG/ML IJ SOLN
INTRAMUSCULAR | Status: AC
Start: 2024-03-01 — End: 2024-03-01
  Filled 2024-03-01: qty 1

## 2024-03-01 MED ORDER — SODIUM CHLORIDE 0.9% FLUSH
10.0000 mL | Freq: Two times a day (BID) | INTRAVENOUS | Status: DC
  Administered 2024-03-01 – 2024-03-02 (×2): 10 mL
  Administered 2024-03-02 – 2024-03-03 (×2): 20 mL
  Administered 2024-03-03 – 2024-03-08 (×8): 10 mL

## 2024-03-01 MED ORDER — PROPOFOL 10 MG/ML IV BOLUS
INTRAVENOUS | Status: AC
  Filled 2024-03-01: qty 20

## 2024-03-01 MED ORDER — OCTREOTIDE ACETATE 100 MCG/ML IJ SOLN
200.0000 ug | Freq: Two times a day (BID) | INTRAMUSCULAR | Status: DC
  Administered 2024-03-01 – 2024-03-08 (×15): 200 ug via SUBCUTANEOUS
  Filled 2024-03-01 (×16): qty 2

## 2024-03-01 MED ORDER — DEXAMETHASONE SODIUM PHOSPHATE 10 MG/ML IJ SOLN
INTRAMUSCULAR | Status: DC | PRN
  Administered 2024-03-01: 5 mg via INTRAVENOUS

## 2024-03-01 MED ORDER — FENTANYL CITRATE (PF) 100 MCG/2ML IJ SOLN
INTRAMUSCULAR | Status: DC | PRN
  Administered 2024-03-01: 50 ug via INTRAVENOUS

## 2024-03-01 MED ORDER — ENOXAPARIN SODIUM 40 MG/0.4ML IJ SOSY
40.0000 mg | PREFILLED_SYRINGE | INTRAMUSCULAR | Status: DC
  Administered 2024-03-01 – 2024-03-08 (×5): 40 mg via SUBCUTANEOUS
  Filled 2024-03-01 (×8): qty 0.4

## 2024-03-01 MED ORDER — METOCLOPRAMIDE HCL 5 MG/ML IJ SOLN: 5.0000 mg | Freq: Three times a day (TID) | INTRAMUSCULAR | Status: DC

## 2024-03-01 MED ORDER — PROPOFOL 10 MG/ML IV BOLUS
INTRAVENOUS | Status: DC | PRN
  Administered 2024-03-01: 130 mg via INTRAVENOUS

## 2024-03-01 MED ORDER — PANTOPRAZOLE SODIUM 40 MG IV SOLR
40.0000 mg | Freq: Two times a day (BID) | INTRAVENOUS | Status: DC
  Administered 2024-03-01 – 2024-03-08 (×15): 40 mg via INTRAVENOUS
  Filled 2024-03-01 (×15): qty 10

## 2024-03-01 MED ORDER — SODIUM CHLORIDE 0.9% FLUSH: 10.0000 mL | INTRAVENOUS | Status: DC | PRN

## 2024-03-01 MED ORDER — ONDANSETRON HCL 4 MG/2ML IJ SOLN
4.0000 mg | Freq: Four times a day (QID) | INTRAMUSCULAR | Status: DC | PRN
  Administered 2024-03-01: 4 mg via INTRAVENOUS
  Filled 2024-03-01: qty 2

## 2024-03-01 MED ORDER — ONDANSETRON HCL 4 MG/2ML IJ SOLN
INTRAMUSCULAR | Status: AC
  Filled 2024-03-01: qty 2

## 2024-03-01 MED ORDER — CHLORHEXIDINE GLUCONATE CLOTH 2 % EX PADS
6.0000 | MEDICATED_PAD | Freq: Every day | CUTANEOUS | Status: DC
  Administered 2024-03-02 – 2024-03-08 (×7): 6 via TOPICAL

## 2024-03-01 MED ORDER — ONDANSETRON HCL 4 MG/2ML IJ SOLN
INTRAMUSCULAR | Status: DC | PRN
  Administered 2024-03-01: 4 mg via INTRAVENOUS

## 2024-03-01 MED ORDER — METOCLOPRAMIDE HCL 5 MG/ML IJ SOLN
5.0000 mg | Freq: Four times a day (QID) | INTRAMUSCULAR | Status: DC
  Administered 2024-03-01 – 2024-03-08 (×26): 5 mg via INTRAVENOUS
  Filled 2024-03-01 (×26): qty 2

## 2024-03-01 MED ORDER — FENTANYL CITRATE (PF) 100 MCG/2ML IJ SOLN
INTRAMUSCULAR | Status: AC
  Filled 2024-03-01: qty 2

## 2024-03-01 MED ORDER — DEXAMETHASONE SODIUM PHOSPHATE 4 MG/ML IJ SOLN
4.0000 mg | INTRAMUSCULAR | Status: DC
  Administered 2024-03-01: 4 mg via INTRAVENOUS
  Filled 2024-03-01: qty 1

## 2024-03-01 MED ORDER — SUCCINYLCHOLINE CHLORIDE 200 MG/10ML IV SOSY
PREFILLED_SYRINGE | INTRAVENOUS | Status: DC | PRN
  Administered 2024-03-01: 100 mg via INTRAVENOUS

## 2024-03-01 MED ORDER — DEXAMETHASONE SODIUM PHOSPHATE 10 MG/ML IJ SOLN
10.0000 mg | INTRAMUSCULAR | Status: DC
  Administered 2024-03-02 – 2024-03-06 (×5): 10 mg via INTRAVENOUS
  Filled 2024-03-01 (×5): qty 1

## 2024-03-01 MED ORDER — SODIUM CHLORIDE 0.9 % IV SOLN: INTRAVENOUS | Status: DC

## 2024-03-01 NOTE — ED Notes (Signed)
 Fall precautions in place for Pt. This RN placed fall band, fall grip socks, bed alarm and fall sign.

## 2024-03-01 NOTE — ED Provider Notes (Signed)
 Assumed care at shift change.  Patient here for intractable vomiting.  CT of the abdomen pelvis was pending and reviewed/interpreted by myself and the radiologist and shows marked fluid distention of the stomach which may represent gastric outlet obstruction versus functional obstruction.  Will place NG tube.  She is getting IV fluids, antiemetics, potassium replacement for hypokalemia.  Magnesium  level is normal.  Will admit to the hospitalist service.   Brett Darko, Josette SAILOR, DO 03/01/24 0111

## 2024-03-01 NOTE — ED Notes (Signed)
 This RN called CCMD and placed Pt on Cardiac monitor

## 2024-03-01 NOTE — Op Note (Signed)
 [facility] Patient Name: Tonya [name] Procedure Date: 03/01/2024 2:29 PM MRN: [id number] Account #: [id number] Date of Birth: December 02, 1965 Admit Type: Inpatient Age: 58 Room: [facility] ENDO ROOM 4 Gender: Female Note Status: Finalized Instrument Name: [id number] [id number] Procedure:             Upper GI endoscopy Indications:           Nausea with vomiting Providers:             Rogelia Copping MD, MD Referring MD:          Oneil PHEBE Pinal, MD (Referring MD) Medicines:             General Anesthesia Complications:         No immediate complications. Procedure:             Pre-Anesthesia Assessment:                        - Prior to the procedure, a History and Physical was                         performed, and patient medications and allergies were                         reviewed. The patient's tolerance of previous                         anesthesia was also reviewed. The risks and benefits                         of the procedure and the sedation options and risks                         were discussed with the patient. All questions were                         answered, and informed consent was obtained. Prior                         Anticoagulants: The patient has taken no anticoagulant                         or antiplatelet agents. ASA Grade Assessment: III - A                         patient with severe systemic disease. After reviewing                         the risks and benefits, the patient was deemed in                         satisfactory condition to undergo the procedure.                        After obtaining informed consent, the endoscope was                         passed under direct vision. Throughout the procedure,  the patient's blood pressure, pulse, and oxygen                         saturations were monitored continuously. The Endoscope                         was introduced through the mouth, and  advanced to the                         second part of duodenum. The upper GI endoscopy was                         accomplished without difficulty. The patient tolerated                         the procedure well. Findings:      The examined esophagus was normal.      A large, fungating, non-circumferential mass with oozing bleeding and       stigmata of recent bleeding was found on the greater curvature of the       stomach. Biopsies were taken with a cold forceps for histology.      A medium-sized non-circumferential mass with no bleeding and no stigmata       of recent bleeding was found at the pylorus. Biopsies were taken with a       cold forceps for histology.      A large frond-like/villous mass with no bleeding was found in the       duodenal bulb. Biopsies were taken with a cold forceps for histology. Impression:            - Normal esophagus.                        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 Biopsied.                        - Gastric tumor at the pylorus. Biopsied.                        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 Recommendation:        - Return patient to hospital ward for ongoing care.                        - NPO.                        - Continue present medications. Procedure Code(s):     --- Professional ---                        [phone], Esophagogastroduodenoscopy, flexible,                         transoral; with biopsy, single or multiple Diagnosis Code(s):     --- Professional ---                        R11.2, Nausea with  vomiting, unspecified                        K31.89, Other diseases of stomach and duodenum                        D49.0, Neoplasm of unspecified behavior of digestive                         system CPT copyright 2022 [employer]. All rights reserved. The codes documented in this report are preliminary and upon coder review may  be revised to  meet current compliance requirements. Rogelia Copping MD, MD 03/01/2024 2:59:33 PM This report has been signed electronically. Number of Addenda: 0 Note Initiated On: 03/01/2024 2:29 PM Estimated Blood Loss:  Estimated blood loss: none.      [facility]

## 2024-03-01 NOTE — ED Notes (Signed)
 Xray called for Ng Tube placement.

## 2024-03-01 NOTE — ED Notes (Signed)
 Pt moved up in bed, dressed out in gown and grip socks replaced.

## 2024-03-01 NOTE — Transfer of Care (Signed)
 Immediate Anesthesia Transfer of Care Note  Patient: Tonya [name]  Procedure(s) Performed: EGD (ESOPHAGOGASTRODUODENOSCOPY)  Patient Location: PACU and Endoscopy Unit  Anesthesia Type:General  Level of Consciousness: awake, alert , and oriented  Airway & Oxygen Therapy: Patient Spontanous Breathing and Patient connected to face mask oxygen  Post-op Assessment: Report given to RN and Post -op Vital signs reviewed and stable  Post vital signs: Reviewed and stable  Last Vitals:  Vitals Value Taken Time  BP 112/71 03/01/24 15:05  Temp    Pulse 92 03/01/24 15:13  Resp 19 03/01/24 15:13  SpO2 100 % 03/01/24 15:13  Vitals shown include unfiled device data.  Last Pain:  Vitals:   03/01/24 1505  TempSrc:   PainSc: 0-No pain         Complications: No notable events documented.

## 2024-03-01 NOTE — Progress Notes (Signed)
 [facility]  [phone] [phone]) [phone]  ID: Tonya [name] OB: 08/26/1965  MR#: [id number]  RDW#:[id number]  Patient Care Team: Cleotilde Oneil FALCON, MD as PCP - General (Internal Medicine) End, Lonni, MD as PCP - Cardiology (Cardiology) Maurie Rayfield BIRCH, RN as Oncology Nurse Navigator  CHIEF COMPLAINT: Progressive endometrial cancer with neuroendocrine component.  INTERVAL HISTORY: Patient is a 58 year old female recently noted to have progressive endometrial cancer who was seen in the clinic earlier this week presenting with complaints of ongoing intolerance to p.o. liquids and solids with vomiting and declining performance status.  CT scan revealed clear progression of disease.  EGD earlier today revealed a large gastric mass oozing blood and a separate pyloric mass going into the duodenum and obstructing.  Previously hospice was discussed with the patient, but she expressed interest in attempting a second line treatment.  REVIEW OF SYSTEMS:   Review of Systems  Constitutional:  Positive for malaise/fatigue and weight loss. Negative for fever.  Respiratory: Negative.  Negative for cough, hemoptysis and shortness of breath.   Cardiovascular: Negative.  Negative for chest pain and leg swelling.  Gastrointestinal:  Positive for abdominal pain and vomiting.  Genitourinary: Negative.  Negative for dysuria.  Musculoskeletal: Negative.  Negative for back pain.  Skin: Negative.  Negative for rash.  Neurological:  Positive for weakness. Negative for dizziness, focal weakness and headaches.  Psychiatric/Behavioral: Negative.  The patient is not nervous/anxious.     As per HPI. Otherwise, a complete review of systems is negative.  PAST MEDICAL HISTORY: Past Medical History:  Diagnosis Date   Cancer (HCC)    Coronary artery disease    GERD (gastroesophageal reflux disease)    Hypertension    Hypothyroidism    Myocardial infarction (HCC)    Thyroid   disease     PAST SURGICAL HISTORY: Past Surgical History:  Procedure Laterality Date   CARDIAC CATHETERIZATION     CESAREAN SECTION     CORONARY STENT INTERVENTION N/A 07/24/2019   Procedure: CORONARY STENT INTERVENTION;  Surgeon: Mady Lonni, MD;  Location: [facility] INVASIVE CV LAB;  Service: Cardiovascular;  Laterality: N/A;  RCA & CFX   IR IMAGING GUIDED PORT INSERTION  09/23/2023   LEFT HEART CATH AND CORONARY ANGIOGRAPHY N/A 07/24/2019   Procedure: LEFT HEART CATH AND CORONARY ANGIOGRAPHY;  Surgeon: Hester Wolm PARAS, MD;  Location: [facility] INVASIVE CV LAB;  Service: Cardiovascular;  Laterality: N/A;   TONSILLECTOMY      FAMILY HISTORY: Family History  Problem Relation Age of Onset   Atrial fibrillation Mother        Passed January 2024.   Colon cancer Mother        Diagnosed 2007   Heart Problems Father    Heart attack Father    Heart disease Father     ADVANCED DIRECTIVES (Y/N):  @ADVDIR @  HEALTH MAINTENANCE: Social History   Tobacco Use   Smoking status: Never   Smokeless tobacco: Never  Vaping Use   Vaping status: Never Used  Substance Use Topics   Alcohol use: Not Currently    Comment: occ   Drug use: Never     Colonoscopy:  PAP:  Bone density:  Lipid panel:  No Known Allergies  Current Facility-Administered Medications  Medication Dose Route Frequency Provider Last Rate Last Admin   0.9 %  sodium chloride  infusion   Intravenous Continuous Laurita Manor T, MD 100 mL/hr at 03/01/24 1436 Continued from Pre-op at 03/01/24 1436   [MAR Hold] dexamethasone  (DECADRON ) injection  10 mg  10 mg Intravenous Q24H Laurita Manor T, MD       [facility] Hold] enoxaparin  (LOVENOX ) injection 40 mg  40 mg Subcutaneous Q24H Laurita Manor T, MD   40 mg at 03/01/24 0937   [MAR Hold] metoCLOPramide  (REGLAN ) injection 5 mg  5 mg Intravenous Q6H Laurita Manor T, MD   5 mg at 03/01/24 1316   [MAR Hold] octreotide  (SANDOSTATIN ) injection 200 mcg  200 mcg Subcutaneous Q12H Laurita Manor T, MD   200  mcg at 03/01/24 1318   [MAR Hold] ondansetron  (ZOFRAN ) injection 4 mg  4 mg Intravenous Q6H PRN Laurita Manor T, MD   4 mg at 03/01/24 0931   [MAR Hold] pantoprazole  (PROTONIX ) injection 40 mg  40 mg Intravenous Q12H Laurita Manor T, MD   40 mg at 03/01/24 0931    OBJECTIVE: Vitals:   03/01/24 1515 03/01/24 1532  BP: 114/73 115/72  Pulse: 90 87  Resp: 17 19  Temp:    SpO2: 100% 100%     Body mass index is 30.9 kg/m.    ECOG FS:3 - Symptomatic, >50% confined to bed  General: Ill-appearing, no acute distress. Eyes: Pink conjunctiva, anicteric sclera. HEENT: NG tube in place. Lungs: No audible wheezing or coughing. Heart: Regular rate and rhythm. Abdomen: Soft, nontender, no obvious distention. Musculoskeletal: No edema, cyanosis, or clubbing. Neuro: Alert, answering all questions appropriately. Cranial nerves grossly intact. Skin: No rashes or petechiae noted. Psych: Normal affect.  LAB RESULTS:  Lab Results  Component Value Date   NA 130 (L) 03/01/2024   K 2.8 (L) 03/01/2024   CL 82 (L) 03/01/2024   CO2 29 03/01/2024   GLUCOSE 118 (H) 03/01/2024   BUN 73 (H) 03/01/2024   CREATININE 1.43 (H) 03/01/2024   CALCIUM  9.1 03/01/2024   PROT 7.0 02/29/2024   ALBUMIN 3.4 (L) 02/29/2024   AST 28 02/29/2024   ALT 8 02/29/2024   ALKPHOS 58 02/29/2024   BILITOT 1.5 (H) 02/29/2024   GFRNONAA 42 (L) 03/01/2024   GFRAA >60 07/25/2019    Lab Results  Component Value Date   WBC 18.6 (H) 02/29/2024   NEUTROABS 8.3 (H) 02/22/2024   HGB 11.0 (L) 02/29/2024   HCT 33.1 (L) 02/29/2024   MCV 97.9 02/29/2024   PLT 443 (H) 02/29/2024     STUDIES: DG Abd Portable 1 View Result Date: 03/01/2024 CLINICAL DATA:  NG tube placement EXAM: PORTABLE ABDOMEN - 1 VIEW COMPARISON:  CT 02/29/2024 FINDINGS: NG tube tip is in the proximal to mid stomach. IMPRESSION: NG tube in the stomach. Electronically Signed   By: Franky Crease M.D.   On: 03/01/2024 01:42   CT ABDOMEN PELVIS W CONTRAST Result  Date: 02/29/2024 CLINICAL DATA:  Nausea vomiting syncope EXAM: CT ABDOMEN AND PELVIS WITH CONTRAST TECHNIQUE: Multidetector CT imaging of the abdomen and pelvis was performed using the standard protocol following bolus administration of intravenous contrast. RADIATION DOSE REDUCTION: This exam was performed according to the departmental dose-optimization program which includes automated exposure control, adjustment of the mA and/or kV according to patient size and/or use of iterative reconstruction technique. CONTRAST:  OMNIPAQUE  IOHEXOL  300 MG/ML  SOLN COMPARISON:  CT 02/23/2024, 11/25/2023 FINDINGS: Lower chest: Lung bases demonstrate no acute airspace disease. 6 mm right lower lobe anterior pulmonary nodule series 4, image 8 again noted. Coronary vascular calcification. Hepatobiliary: Hyperdense appearance of gallbladder, possible sludge. No calcified stone. No biliary dilatation Pancreas: Unremarkable. No pancreatic ductal dilatation or surrounding inflammatory changes. Spleen: Normal  in size without focal abnormality. Adrenals/Urinary Tract: Stable adrenal glands. Mild left-sided hydronephrosis and hydroureter, slightly increased compared to prior, with more normal appearing caliber of distal left ureter in the pelvis, anterior to the iliac vessels. No obstructing stone. Decreased excretion of contrast on the left on delayed views consistent with obstruction. Nodular soft tissue thickening at the right posterior bladder, measuring about 35 x 16 mm on series 2, image 75, stable in the short interim. Stomach/Bowel: Interval marked fluid distension of the stomach. Irregular wall thickening of the antral pyloric region with mucosal enhancement. Decompressed duodenum. Slightly low lying ligament of Treitz probably displaced by gastric distension. Elsewhere no dilated bowel is seen. 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 Vascular/Lymphatic: Moderate aortic atherosclerosis. As seen on recent prior, multiple  enlarged abdominal, retroperitoneal and pelvic lymph nodes. Gastrohepatic nodes measuring up to 14 mm on series 2, image 27. Portal caval node measuring 21 mm on series 2, image 28. Index left iliac node measuring 22 mm on series 2, image 66, previously 21 mm. Reproductive: Stable appearance of the uterus and adnexal structures. Some asymmetrical soft tissue thickening of the right vulvar and perineal soft tissues, series 2 image 84. Other: No free air. Generalized subcutaneous edema. Slight increased ascites within the abdomen and pelvis, now small-moderate volume. Peritoneal metastatic disease as evidenced by stranding and nodularity of the omentum. Musculoskeletal: No new lesion. High-grade compression deformity at L1 unchanged IMPRESSION: 1. Interval marked fluid distension of the stomach with irregular wall thickening of the antral pyloric region with mucosal enhancement with otherwise decompressed appearance of the bowel, outlet obstruction or functional obstruction of the stomach cannot be excluded. NG decompression is recommended 2. Slight increased ascites within the abdomen and pelvis, now small-moderate volume. No free air. 3. New mild left-sided hydronephrosis and hydroureter, with more normal appearing caliber of distal left ureter in the pelvis, anterior to the iliac vessels. No obstructing stone. Findings are consistent with distal ureteral obstruction. Decreased excretion of contrast from the left kidney on delayed views. 4. Nodular soft tissue thickening at the right posterior bladder, stable in the short interim but suspicious for neoplasm/metastatic disease. 5. Extensive abdominal, retroperitoneal and pelvic adenopathy consistent with metastatic disease. 6. 6 mm right lower lobe pulmonary nodule, stable, and also concerning for metastatic disease. 7. Aortic atherosclerosis. Aortic Atherosclerosis (ICD10-I70.0). Electronically Signed   By: Luke Bun M.D.   On: 02/29/2024 23:19   US  Venous Img  Lower Unilateral Right Result Date: 02/29/2024 CLINICAL DATA:  Leg swelling for 1 day. Fell on Monday. History of uterine cancer. EXAM: Right LOWER EXTREMITY VENOUS DOPPLER ULTRASOUND TECHNIQUE: Gray-scale sonography with compression, as well as color and duplex ultrasound, were performed to evaluate the deep venous system(s) from the level of the common femoral vein through the popliteal and proximal calf veins. COMPARISON:  None Available. FINDINGS: VENOUS Normal compressibility of the common femoral, superficial femoral, and popliteal veins, as well as the visualized calf veins. Visualized portions of profunda femoral vein and great saphenous vein unremarkable. No filling defects to suggest DVT on grayscale or color Doppler imaging. Doppler waveforms show normal direction of venous flow, normal respiratory plasticity and response to augmentation. Limited views of the contralateral common femoral vein are unremarkable. OTHER None. Limitations: none IMPRESSION: No evidence of acute deep venous thrombosis in the visualized lower extremity veins. Electronically Signed   By: Elsie Gravely M.D.   On: 02/29/2024 21:34   CT CHEST ABDOMEN PELVIS W CONTRAST Result Date: 02/24/2024 CLINICAL DATA:  Metastatic endometrial cancer, ongoing chemotherapy * Tracking Code: BO * EXAM: CT CHEST, ABDOMEN, AND PELVIS WITH CONTRAST TECHNIQUE: Multidetector CT imaging of the chest, abdomen and pelvis was performed following the standard protocol during bolus administration of intravenous contrast. RADIATION DOSE REDUCTION: This exam was performed according to the departmental dose-optimization program which includes automated exposure control, adjustment of the mA and/or kV according to patient size and/or use of iterative reconstruction technique. CONTRAST:  OMNIPAQUE  IOHEXOL  300 MG/ML  SOLN COMPARISON:  11/25/2023 FINDINGS: CT CHEST FINDINGS Cardiovascular: Right chest port catheter. Scattered aortic atherosclerosis. Normal  heart size. Three-vessel coronary artery calcifications. No pericardial effusion. Mediastinum/Nodes: Newly enlarged left axillary and subpectoral lymph nodes measuring up to 2.1 x 1.6 cm (series 2, image 12). Thyroid  gland, trachea, and esophagus demonstrate no significant findings. Lungs/Pleura: Interval resolution of previously seen left pleural effusion. Multiple new and enlarged bilateral pulmonary nodules, for example a 0.6 cm nodule of the anterior right lower lobe, previously no greater than 0.2 cm (series 3, image 88), and a new nodule in the posterior left upper lobe measuring 0.7 cm (series 3, image 54). Musculoskeletal: No chest wall abnormality. No acute osseous findings. CT ABDOMEN PELVIS FINDINGS Hepatobiliary: No solid liver abnormality is seen. No gallstones, gallbladder wall thickening, or biliary dilatation. Pancreas: Unremarkable. No pancreatic ductal dilatation or surrounding inflammatory changes. Spleen: Normal in size without significant abnormality. Adrenals/Urinary Tract: Adrenal glands are unremarkable. Kidneys are normal, without renal calculi, solid lesion, or hydronephrosis. New, enhancing nodular soft tissue along the posterior right aspect of the bladder dome measuring approximately 3.3 x 1.3 cm (series 2, image 101). Stomach/Bowel: Mucosal thickening and hyperenhancement of the stomach (series 2, image 61). Appendix appears normal. No evidence of bowel wall thickening, distention, or inflammatory changes. Vascular/Lymphatic: Aortic atherosclerosis. Interval enlargement of numerous abdominal and pelvic lymph nodes, involving the gastrohepatic ligament, celiac, porta hepatis, portacaval, retroperitoneal, bilateral iliac, and pelvic sidewall stations. Many lymph nodes are matted, particularly in the retroperitoneum and iliac stations. Largest index left iliac nodes measure up to 3.1 x 2.1 cm, previously on measurable (series 2, image 71). Largest portacaval nodes measure up to 2.9 x 1.9  cm, previously 1.3 x 0.9 cm (series 2, image 57). Reproductive: Matted appearance of the uterus and ovaries, similar to prior examination (series 2, image 92). Other: No abdominal wall hernia or abnormality. Small volume ascites throughout the abdomen and pelvis, similar in volume to prior examination. Interval increase in very fine peritoneal and omental nodularity in the ventral abdomen (series 2, image 72). Musculoskeletal: No acute osseous findings. Unchanged high-grade wedge deformity of L1 (series 6, image 94). IMPRESSION: 1. Interval enlargement of numerous lymph nodes throughout the chest, abdomen, and pelvis, consistent with worsened nodal metastatic disease. 2. Multiple new and enlarged bilateral pulmonary nodules, consistent with pulmonary metastatic disease. 3. Small volume ascites throughout the abdomen pelvis, similar to prior examination. Interval increase in fine peritoneal and omental nodularity in the ventral abdomen as well as new, enhancing nodular soft tissue along the posterior right aspect of the bladder dome consistent with worsened peritoneal metastatic disease. 4. Matted post treatment appearance of the uterus and ovaries, similar to prior examination. 5. Mucosal thickening and hyperenhancement of the stomach, suggestive of nonspecific infectious or inflammatory gastritis. 6. Interval resolution of previously seen left pleural effusion. 7. Coronary artery disease. Aortic Atherosclerosis (ICD10-I70.0). Electronically Signed   By: Marolyn JONETTA Jaksch M.D.   On: 02/24/2024 16:28    ASSESSMENT:  Progressive endometrial cancer with neuroendocrine component.  PLAN:    Progressive endometrial cancer with neuroendocrine component: CT scan results from February 24, 2024 reviewed independently and report as above with clear progression of disease.  EGD results earlier today revealed a large gastric mass oozing blood and a separate pyloric mass going into the duodenum and obstructing.  Patient previously  expressed interest in attempting second line treatment.  Hospice was discussed, but patient is not ready to make this decision.  Given the extent of disease, she does not appear to be a surgical candidate.  There is a possibility of IR placing a venting PEG for symptomatic relief.  Will further discuss options with patient tomorrow. Renal insufficiency: Patient's creatinine has trended up to 1.43.  Continue IV fluids as ordered. Hypokalemia: Replace electrolytes as necessary.  Appreciate consult, will follow.    Evalene JINNY Reusing, MD   03/01/2024 4:38 PM

## 2024-03-01 NOTE — ED Notes (Signed)
 Patient was given mouth swabs for dry mouth. Patient was also readjusted in bed.

## 2024-03-01 NOTE — Anesthesia Preprocedure Evaluation (Signed)
 Anesthesia Evaluation  Patient identified by MRN, date of birth, ID band Patient awake    Reviewed: Allergy & Precautions, NPO status , Patient's Chart, lab work & pertinent test results  Airway Mallampati: III  TM Distance: >3 FB Neck ROM: full    Dental  (+) Dental Advidsory Given, Chipped   Pulmonary neg pulmonary ROS   Pulmonary exam normal        Cardiovascular hypertension, + CAD, + Past MI, + Peripheral Vascular Disease and +CHF  Normal cardiovascular exam     Neuro/Psych negative neurological ROS  negative psych ROS   GI/Hepatic Neg liver ROS,GERD  ,,  Endo/Other  diabetesHypothyroidism    Renal/GU Renal disease     Musculoskeletal   Abdominal   Peds  Hematology  (+) Blood dyscrasia, anemia   Anesthesia Other Findings Patient with a PMH of potassium of 2.8 but per surgeon, case is emergent and will proceed. Explained the increased risk of arrhythmias. Patient states she is aware and willing to proceed.   Past Medical History: No date: Cancer (HCC) No date: Coronary artery disease No date: GERD (gastroesophageal reflux disease) No date: Hypertension No date: Hypothyroidism No date: Myocardial infarction (HCC) No date: Thyroid  disease  Past Surgical History: No date: CARDIAC CATHETERIZATION No date: CESAREAN SECTION 07/24/2019: CORONARY STENT INTERVENTION; N/A     Comment:  Procedure: CORONARY STENT INTERVENTION;  Surgeon: Mady Bruckner, MD;  Location: [facility] INVASIVE CV LAB;                Service: Cardiovascular;  Laterality: N/A;  RCA & CFX 09/23/2023: IR IMAGING GUIDED PORT INSERTION 07/24/2019: LEFT HEART CATH AND CORONARY ANGIOGRAPHY; N/A     Comment:  Procedure: LEFT HEART CATH AND CORONARY ANGIOGRAPHY;                Surgeon: Hester Wolm PARAS, MD;  Location: [facility] INVASIVE               CV LAB;  Service: Cardiovascular;  Laterality: N/A; No date: TONSILLECTOMY  BMI    Body  Mass Index: 30.90 kg/m      Reproductive/Obstetrics negative OB ROS                              Anesthesia Physical Anesthesia Plan  ASA: 3 and emergent  Anesthesia Plan: General ETT   Post-op Pain Management:    Induction: Intravenous and Rapid sequence  PONV Risk Score and Plan: 3 and Ondansetron  and Dexamethasone   Airway Management Planned: Oral ETT  Additional Equipment:   Intra-op Plan:   Post-operative Plan: Extubation in OR  Informed Consent: I have reviewed the patients History and Physical, chart, labs and discussed the procedure including the risks, benefits and alternatives for the proposed anesthesia with the patient or authorized representative who has indicated his/her understanding and acceptance.     Dental Advisory Given  Plan Discussed with: Anesthesiologist, CRNA and Surgeon  Anesthesia Plan Comments: (Patient consented for risks of anesthesia including but not limited to:  - adverse reactions to medications - damage to eyes, teeth, lips or other oral mucosa - nerve damage due to positioning  - sore throat or hoarseness - Damage to heart, brain, nerves, lungs, other parts of body or loss of life  Patient voiced understanding and assent.)        Anesthesia Quick Evaluation

## 2024-03-01 NOTE — H&P (Addendum)
 History and Physical    Tonya [name] FMW:[id number] DOB: [birth year] DOA: 02/29/2024  PCP: Cleotilde Oneil FALCON, MD (Confirm with patient/family/NH records and if not entered, this has to be entered at [facility] point of entry) Patient coming from: Home  I have personally briefly reviewed patient's old medical records in [facility]  Chief Complaint: Nauseous vomiting abdominal pain  HPI: Tonya [name] is a 58 y.o. 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  Patient has stage IV metastatic endometrial cancer, based metastatic sites involving pelvic and abdominal retroperitoneal space and lung, for which she received chemotherapy January to June of this year.  From early May of this year, she started to have feeling of nauseous vomiting, sometimes biliary vomitus, which was initially attributed to chemotherapy.  However her symptoms persisted since chemotherapy completed in June.  In addition, she started to develop cramping-like abdominal pain.  She went to see her oncology for her symptoms, and was started on Decadron  and PPI with some relief.  Over the weekend her symptoms became worse and went to see oncology again on last week, a CT scan outpatient demonstrated that mucosal thickening and hyperenhancement of the stomach suggestive of nonspecific infection inflammatory gastritis.  Last 3 days, patient has not been able to eat or drink anything including Ensure and other fluid and became very dehydrated.  ED Course: Afebrile, no tachycardia blood pressure borderline low not hypoxic.  CT abdomen pelvis showed interval marked improvement with distention of the stomach with irregular wall thickening of antropyloric region with mucosal enhancement with otherwise decompressed appearance of the bowel outlet obstruction or functional obstruction of  stomach cannot be excluded.  Blood work showed sodium 130, potassium 2.6 chloride 83 glucose 162 BUN 80 creatinine 1.3.  NG tube inserted in the ED.  Review of Systems: As per HPI otherwise 14 point review of systems negative.    Past Medical History:  Diagnosis Date   Cancer (HCC)    Coronary artery disease    GERD (gastroesophageal reflux disease)    Hypertension    Hypothyroidism    Myocardial infarction (HCC)    Thyroid  disease     Past Surgical History:  Procedure Laterality Date   CARDIAC CATHETERIZATION     CESAREAN SECTION     CORONARY STENT INTERVENTION N/A 07/24/2019   Procedure: CORONARY STENT INTERVENTION;  Surgeon: Mady Bruckner, MD;  Location: [facility] INVASIVE CV LAB;  Service: Cardiovascular;  Laterality: N/A;  RCA & CFX   IR IMAGING GUIDED PORT INSERTION  09/23/2023   LEFT HEART CATH AND CORONARY ANGIOGRAPHY N/A 07/24/2019   Procedure: LEFT HEART CATH AND CORONARY ANGIOGRAPHY;  Surgeon: Hester Wolm PARAS, MD;  Location: [facility] INVASIVE CV LAB;  Service: Cardiovascular;  Laterality: N/A;   TONSILLECTOMY       reports that she has never smoked. She has never used smokeless tobacco. She reports that she does not currently use alcohol. She reports that she does not use drugs.  No Known Allergies  Family History  Problem Relation Age of Onset   Atrial fibrillation Mother        Passed January 2024.   Colon cancer Mother        Diagnosed 2007   Heart Problems Father    Heart attack Father    Heart disease Father      Prior to Admission medications   Medication Sig Start Date End  Date Taking? Authorizing Provider  Cholecalciferol (VITAMIN D-1000 MAX ST) 25 MCG (1000 UT) tablet Take 1,000 Units by mouth daily. Patient not taking: Reported on 02/29/2024 05/25/23   [provider]  dexamethasone  (DECADRON ) 2 MG tablet Take 1 tablet (2 mg total) by mouth daily. 02/23/24   Dasie Tinnie MATSU, NP  losartan -hydrochlorothiazide (HYZAAR) 50-12.5 MG tablet Take 1 tablet  by mouth daily. Patient not taking: Reported on 02/29/2024 11/28/23 11/27/24  [provider]  metoprolol  tartrate (LOPRESSOR ) 25 MG tablet Take 0.5 tablets (12.5 mg total) by mouth 2 (two) times daily. Patient not taking: Reported on 02/29/2024 10/14/23 11/21/23  Trudy Anthony HERO, MD  Multiple Vitamin (MULTI-VITAMIN) tablet Take 1 tablet by mouth daily. Patient not taking: Reported on 02/29/2024    [provider]  Nutritional Supplements (ENSURE HIGH PROTEIN) LIQD 237 mL 2 TIMES DAILY (route: oral) Patient not taking: Reported on 02/29/2024 11/08/23   [provider]  nystatin  cream (MYCOSTATIN ) Apply topically 2 (two) times daily. Patient not taking: Reported on 02/29/2024 11/07/23   Dorinda Drue DASEN, MD  pantoprazole  (PROTONIX ) 20 MG tablet Take 1 tablet (20 mg total) by mouth daily. For acid reflux 02/20/24   Dasie Tinnie MATSU, NP  potassium chloride  (KLOR-CON  M) 10 MEQ tablet Take 2 tablets (20 mEq total) by mouth 2 (two) times daily. Patient not taking: Reported on 02/29/2024 12/05/23   Dasie Tinnie MATSU, NP  sodium chloride  1 g tablet Take 1 tablet (1 g total) by mouth 2 (two) times daily with a meal. Patient not taking: Reported on 02/29/2024 10/06/23   Kandis Devaughn Sayres, MD  thyroid  (ARMOUR) 120 MG tablet Take 120 mg by mouth daily before breakfast.    [provider]  Vitamin D, Ergocalciferol, (DRISDOL) 1.25 MG (50000 UNIT) CAPS capsule Take 50,000 Units by mouth once a week. Patient not taking: Reported on 02/29/2024 08/03/23   [provider]    Physical Exam: Vitals:   03/01/24 0600 03/01/24 0630 03/01/24 0700 03/01/24 0702  BP: 115/73 117/77 130/78   Pulse: 86 84 89   Resp: 13 14 11    Temp:    (!) 97.4 F (36.3 C)  TempSrc:    Oral  SpO2: 100% 100% 100%   Weight:      Height:        Constitutional: NAD, calm, comfortable Vitals:   03/01/24 0600 03/01/24 0630 03/01/24 0700 03/01/24 0702  BP: 115/73 117/77 130/78   Pulse: 86 84 89   Resp: 13 14 11     Temp:    (!) 97.4 F (36.3 C)  TempSrc:    Oral  SpO2: 100% 100% 100%   Weight:      Height:       Eyes: PERRL, lids and conjunctivae normal ENMT: Mucous membranes are dry. Posterior pharynx clear of any exudate or lesions.Normal dentition.  Neck: normal, supple, no masses, no thyromegaly Respiratory: clear to auscultation bilaterally, no wheezing, no crackles. Normal respiratory effort. No accessory muscle use.  Cardiovascular: Regular rate and rhythm, no murmurs / rubs / gallops. No extremity edema. 2+ pedal pulses. No carotid bruits.  Abdomen: Tenderness on epigastric area, abdomen distended, no rebound no guarding, no masses palpated. No hepatosplenomegaly. Bowel sounds positive.  Musculoskeletal: no clubbing / cyanosis. No joint deformity upper and lower extremities. Good ROM, no contractures. Normal muscle tone.  Skin: no rashes, lesions, ulcers. No induration Neurologic: CN 2-12 grossly intact. Sensation intact, DTR normal. Strength 5/5 in all 4.  Psychiatric: Normal judgment and  insight. Alert and oriented x 3. Normal mood.    Labs on Admission: I have personally reviewed following labs and imaging studies  CBC: Recent Labs  Lab 02/29/24 2036  WBC 18.6*  HGB 11.0*  HCT 33.1*  MCV 97.9  PLT 443*   Basic Metabolic Panel: Recent Labs  Lab 02/29/24 2036 02/29/24 2037  NA 130*  --   K 2.6*  --   CL 83*  --   CO2 25  --   GLUCOSE 162*  --   BUN 80*  --   CREATININE 1.37*  --   CALCIUM  9.5  --   MG  --  2.6*   GFR: Estimated Creatinine Clearance: 45.7 mL/min (A) (by C-G formula based on SCr of 1.37 mg/dL (H)). Liver Function Tests: Recent Labs  Lab 02/29/24 2036  AST 28  ALT 8  ALKPHOS 58  BILITOT 1.5*  PROT 7.0  ALBUMIN 3.4*   No results for input(s): LIPASE, AMYLASE in the last 168 hours. No results for input(s): AMMONIA in the last 168 hours. Coagulation Profile: No results for input(s): INR, PROTIME in the last 168 hours. Cardiac  Enzymes: No results for input(s): CKTOTAL, CKMB, CKMBINDEX, TROPONINI in the last 168 hours. BNP (last 3 results) No results for input(s): PROBNP in the last 8760 hours. HbA1C: No results for input(s): HGBA1C in the last 72 hours. CBG: No results for input(s): GLUCAP in the last 168 hours. Lipid Profile: No results for input(s): CHOL, HDL, LDLCALC, TRIG, CHOLHDL, LDLDIRECT in the last 72 hours. Thyroid  Function Tests: No results for input(s): TSH, T4TOTAL, FREET4, T3FREE, THYROIDAB in the last 72 hours. Anemia Panel: No results for input(s): VITAMINB12, FOLATE, FERRITIN, TIBC, IRON, RETICCTPCT in the last 72 hours. Urine analysis:    Component Value Date/Time   COLORURINE YELLOW (A) 11/06/2023 0430   APPEARANCEUR HAZY (A) 11/06/2023 0430   LABSPEC 1.011 11/06/2023 0430   PHURINE 5.0 11/06/2023 0430   GLUCOSEU NEGATIVE 11/06/2023 0430   HGBUR NEGATIVE 11/06/2023 0430   BILIRUBINUR NEGATIVE 11/06/2023 0430   KETONESUR 5 (A) 11/06/2023 0430   PROTEINUR NEGATIVE 11/06/2023 0430   NITRITE NEGATIVE 11/06/2023 0430   LEUKOCYTESUR NEGATIVE 11/06/2023 0430    Radiological Exams on Admission: DG Abd Portable 1 View Result Date: 03/01/2024 CLINICAL DATA:  NG tube placement EXAM: PORTABLE ABDOMEN - 1 VIEW COMPARISON:  CT 02/29/2024 FINDINGS: NG tube tip is in the proximal to mid stomach. IMPRESSION: NG tube in the stomach. Electronically Signed   By: Franky Crease M.D.   On: 03/01/2024 01:42   CT ABDOMEN PELVIS W CONTRAST Result Date: 02/29/2024 CLINICAL DATA:  Nausea vomiting syncope EXAM: CT ABDOMEN AND PELVIS WITH CONTRAST TECHNIQUE: Multidetector CT imaging of the abdomen and pelvis was performed using the standard protocol following bolus administration of intravenous contrast. RADIATION DOSE REDUCTION: This exam was performed according to the departmental dose-optimization program which includes automated exposure control, adjustment of the  mA and/or kV according to patient size and/or use of iterative reconstruction technique. CONTRAST:  OMNIPAQUE  IOHEXOL  300 MG/ML  SOLN COMPARISON:  CT 02/23/2024, 11/25/2023 FINDINGS: Lower chest: Lung bases demonstrate no acute airspace disease. 6 mm right lower lobe anterior pulmonary nodule series 4, image 8 again noted. Coronary vascular calcification. Hepatobiliary: Hyperdense appearance of gallbladder, possible sludge. No calcified stone. No biliary dilatation Pancreas: Unremarkable. No pancreatic ductal dilatation or surrounding inflammatory changes. Spleen: Normal in size without focal abnormality. Adrenals/Urinary Tract: Stable adrenal glands. Mild left-sided hydronephrosis and hydroureter, slightly increased compared  to prior, with more normal appearing caliber of distal left ureter in the pelvis, anterior to the iliac vessels. No obstructing stone. Decreased excretion of contrast on the left on delayed views consistent with obstruction. Nodular soft tissue thickening at the right posterior bladder, measuring about 35 x 16 mm on series 2, image 75, stable in the short interim. Stomach/Bowel: Interval marked fluid distension of the stomach. Irregular wall thickening of the antral pyloric region with mucosal enhancement. Decompressed duodenum. Slightly low lying ligament of Treitz probably displaced by gastric distension. Elsewhere no dilated bowel is seen. 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 Vascular/Lymphatic: Moderate aortic atherosclerosis. As seen on recent prior, multiple enlarged abdominal, retroperitoneal and pelvic lymph nodes. Gastrohepatic nodes measuring up to 14 mm on series 2, image 27. Portal caval node measuring 21 mm on series 2, image 28. Index left iliac node measuring 22 mm on series 2, image 66, previously 21 mm. Reproductive: Stable appearance of the uterus and adnexal structures. Some asymmetrical soft tissue thickening of the right vulvar and perineal soft tissues, series  2 image 84. Other: No free air. Generalized subcutaneous edema. Slight increased ascites within the abdomen and pelvis, now small-moderate volume. Peritoneal metastatic disease as evidenced by stranding and nodularity of the omentum. Musculoskeletal: No new lesion. High-grade compression deformity at L1 unchanged IMPRESSION: 1. Interval marked fluid distension of the stomach with irregular wall thickening of the antral pyloric region with mucosal enhancement with otherwise decompressed appearance of the bowel, outlet obstruction or functional obstruction of the stomach cannot be excluded. NG decompression is recommended 2. Slight increased ascites within the abdomen and pelvis, now small-moderate volume. No free air. 3. New mild left-sided hydronephrosis and hydroureter, with more normal appearing caliber of distal left ureter in the pelvis, anterior to the iliac vessels. No obstructing stone. Findings are consistent with distal ureteral obstruction. Decreased excretion of contrast from the left kidney on delayed views. 4. Nodular soft tissue thickening at the right posterior bladder, stable in the short interim but suspicious for neoplasm/metastatic disease. 5. Extensive abdominal, retroperitoneal and pelvic adenopathy consistent with metastatic disease. 6. 6 mm right lower lobe pulmonary nodule, stable, and also concerning for metastatic disease. 7. Aortic atherosclerosis. Aortic Atherosclerosis (ICD10-I70.0). Electronically Signed   By: Luke Bun M.D.   On: 02/29/2024 23:19   US  Venous Img Lower Unilateral Right Result Date: 02/29/2024 CLINICAL DATA:  Leg swelling for 1 day. Fell on Monday. History of uterine cancer. EXAM: Right LOWER EXTREMITY VENOUS DOPPLER ULTRASOUND TECHNIQUE: Gray-scale sonography with compression, as well as color and duplex ultrasound, were performed to evaluate the deep venous system(s) from the level of the common femoral vein through the popliteal and proximal calf veins.  COMPARISON:  None Available. FINDINGS: VENOUS Normal compressibility of the common femoral, superficial femoral, and popliteal veins, as well as the visualized calf veins. Visualized portions of profunda femoral vein and great saphenous vein unremarkable. No filling defects to suggest DVT on grayscale or color Doppler imaging. Doppler waveforms show normal direction of venous flow, normal respiratory plasticity and response to augmentation. Limited views of the contralateral common femoral vein are unremarkable. OTHER None. Limitations: none IMPRESSION: No evidence of acute deep venous thrombosis in the visualized lower extremity veins. Electronically Signed   By: Elsie Gravely M.D.   On: 02/29/2024 21:34    EKG: Independently reviewed.  Sinus tachycardia, no significant ST-T changes.  Assessment/Plan Principal Problem:   Intractable nausea and vomiting Active Problems:   Nausea & vomiting  (  please populate well all problems here in Problem List. (For example, if patient is on BP meds at home and you resume or decide to hold them, it is a problem that needs to be her. Same for CAD, COPD, HLD and so on)  Intractable nausea/vomiting Functional versus structural stomach outlet obstruction -Discussed case with patient's oncology and surgery, surgeon reviewed CT scan from last week versus CT scan last night, impression is mixed, surgeons impression this obstruction likely related to metastatic cancer and carcinomatosis, oncology more leaning toward functional obstruction.  Oncology discussed the case last week with [facility] oncology, who recommended starting patient on Reglan  20 to 30 mg daily, as well as initiation of octreotide  subcu 200 mg daily therapy.  Oncology recommended venting PEG, and per surgery recommend consult IR for venting PEG insertion.  Hypokalemia, severe - Secondary to poor oral intake - IV replacement - Magnesium = 2.6  AKI - Likely prerenal secondary to poor oral intake and  repeated nauseous vomiting - IV fluid - Other DDx, CT scan showed signs of mild bilateral hydronephrosis likely from distal ureteral obstruction.  Patient reported that still making urine.  Leukocytosis - No other SIRS signs to indicate systemic infection, Ua is pending, hold off antibiotics and repeat CBC tomorrow  Chronic hyponatremia SIADH - Sodium level above her baseline, right now patient appears to be volume contracted, will start patient on maintenance IV fluid and monitor sodium level daily.  Consider restart sodium chloride  tablet once able to take p.o.  History of chronic HFrEF - Volume contracted on IV fluid, monitor volume status  Hypothyroidism - Resume p.o. Synthroid once able to take p.o.    DVT prophylaxis: Lovenox  Code Status: Full code Family Communication: None at bedside Disposition Plan: Patient is sick with acute stomach outlet obstruction current NG tube, extensive AKI and electrolyte imbalance requiring IV fluid maintenance therapy while n.p.o., expect more than 2 midnight hospital stay Consults called: Oncology, general surgery Admission status: Telemetry admission   Cort ONEIDA Mana MD [facility] Pager [phone]  03/01/2024, 10:08 AM

## 2024-03-01 NOTE — Consult Note (Signed)
 Tonya Copping, MD [facility]  [address] [place], [place] [address] Phone: [phone] Fax : [phone]  Consultation  Referring Provider:     Dr. Laurita Primary Care Physician:  Cleotilde Oneil FALCON, MD Primary Gastroenterologist:          Sampson Reason for Consultation:     Possible gastric outlet obstruction  Date of Admission:  02/29/2024 Date of Consultation:  03/01/2024         HPI:   Tonya [name] is a 58 y.o. 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  She has been having worsening of her nausea and vomiting with abdominal pain.  The patient underwent a CT scan of the abdomen that showed:  IMPRESSION: 1. Interval marked fluid distension of the stomach with irregular wall thickening of the antral pyloric region with mucosal enhancement with otherwise decompressed appearance of the bowel, outlet obstruction or functional obstruction of the stomach cannot be excluded. NG decompression is recommended 2. Slight increased ascites within the abdomen and pelvis, now small-moderate volume. No free air. 3. New mild left-sided hydronephrosis and hydroureter, with more normal appearing caliber of distal left ureter in the pelvis, anterior to the iliac vessels. No obstructing stone. Findings are consistent with distal ureteral obstruction. Decreased excretion of contrast from the left kidney on delayed views. 4. Nodular soft tissue thickening at the right posterior bladder, stable in the short interim but suspicious for neoplasm/metastatic disease. 5. Extensive abdominal, retroperitoneal and pelvic adenopathy consistent with metastatic disease. 6. 6 mm right lower lobe pulmonary nodule, stable, and also concerning for metastatic disease. 7. Aortic atherosclerosis  Due to the patient being reported to have possible  outlet obstruction and having copious amounts of fluid coming from her NG tube a venting gastrostomy tube was recommended.  The patient was seen by surgery and by interventional radiology.  After reviewing the case interventional radiology was suspecting that there was not gastric outlet obstruction and recommended an upper endoscopy to rule out gastric outlet obstruction.  Past Medical History:  Diagnosis Date   Cancer (HCC)    Coronary artery disease    GERD (gastroesophageal reflux disease)    Hypertension    Hypothyroidism    Myocardial infarction (HCC)    Thyroid  disease     Past Surgical History:  Procedure Laterality Date   CARDIAC CATHETERIZATION     CESAREAN SECTION     CORONARY STENT INTERVENTION N/A 07/24/2019   Procedure: CORONARY STENT INTERVENTION;  Surgeon: Mady Bruckner, MD;  Location: [facility] INVASIVE CV LAB;  Service: Cardiovascular;  Laterality: N/A;  RCA & CFX   IR IMAGING GUIDED PORT INSERTION  09/23/2023   LEFT HEART CATH AND CORONARY ANGIOGRAPHY N/A 07/24/2019   Procedure: LEFT HEART CATH AND CORONARY ANGIOGRAPHY;  Surgeon: Hester Wolm PARAS, MD;  Location: [facility] INVASIVE CV LAB;  Service: Cardiovascular;  Laterality: N/A;   TONSILLECTOMY      Prior to Admission medications   Medication Sig Start Date End Date Taking? Authorizing Provider  Cholecalciferol (VITAMIN D-1000 MAX ST) 25 MCG (1000 UT) tablet Take 1,000 Units by mouth daily. Patient not taking: Reported on 02/29/2024 05/25/23   [provider]  dexamethasone  (DECADRON ) 2 MG tablet Take 1 tablet (2 mg total) by mouth daily. 02/23/24   Dasie Tinnie MATSU, NP  losartan -hydrochlorothiazide (HYZAAR) 50-12.5 MG tablet Take 1 tablet by mouth daily. Patient  not taking: Reported on 02/29/2024 11/28/23 11/27/24  [provider]  metoprolol  tartrate (LOPRESSOR ) 25 MG tablet Take 0.5 tablets (12.5 mg total) by mouth 2 (two) times daily. Patient not taking: Reported on 02/29/2024 10/14/23 11/21/23  Trudy Anthony HERO, MD  Multiple Vitamin (MULTI-VITAMIN) tablet Take 1 tablet by mouth daily. Patient not taking: Reported on 02/29/2024    [provider]  Nutritional Supplements (ENSURE HIGH PROTEIN) LIQD 237 mL 2 TIMES DAILY (route: oral) Patient not taking: Reported on 02/29/2024 11/08/23   [provider]  nystatin  cream (MYCOSTATIN ) Apply topically 2 (two) times daily. Patient not taking: Reported on 02/29/2024 11/07/23   Dorinda Drue DASEN, MD  pantoprazole  (PROTONIX ) 20 MG tablet Take 1 tablet (20 mg total) by mouth daily. For acid reflux 02/20/24   Dasie Tinnie MATSU, NP  potassium chloride  (KLOR-CON  M) 10 MEQ tablet Take 2 tablets (20 mEq total) by mouth 2 (two) times daily. Patient not taking: Reported on 02/29/2024 12/05/23   Dasie Tinnie MATSU, NP  sodium chloride  1 g tablet Take 1 tablet (1 g total) by mouth 2 (two) times daily with a meal. Patient not taking: Reported on 02/29/2024 10/06/23   Kandis Devaughn Sayres, MD  thyroid  (ARMOUR) 120 MG tablet Take 120 mg by mouth daily before breakfast.    [provider]  Vitamin D, Ergocalciferol, (DRISDOL) 1.25 MG (50000 UNIT) CAPS capsule Take 50,000 Units by mouth once a week. Patient not taking: Reported on 02/29/2024 08/03/23   [provider]    Family History  Problem Relation Age of Onset   Atrial fibrillation Mother        Passed January 2024.   Colon cancer Mother        Diagnosed 2007   Heart Problems Father    Heart attack Father    Heart disease Father      Social History   Tobacco Use   Smoking status: Never   Smokeless tobacco: Never  Vaping Use   Vaping status: Never Used  Substance Use Topics   Alcohol use: Not Currently    Comment: occ   Drug use: Never    Allergies as of 02/29/2024   (No Known Allergies)    Review of Systems:    All systems reviewed and negative except where noted in HPI.   Physical Exam:  Vital signs in last 24 hours: Temp:  [97.4 F (36.3 C)-98.6 F (37 C)] 97.9 F (36.6 C)  (07/10 1325) Pulse Rate:  [84-110] 86 (07/10 1130) Resp:  [11-22] 15 (07/10 1130) BP: (95-130)/(62-83) 121/76 (07/10 1130) SpO2:  [94 %-100 %] 99 % (07/10 1130) Weight:  [81.6 kg] 81.6 kg (07/09 1550)   General:   Pleasant, cooperative in NAD Head:  Normocephalic and atraumatic. Eyes:   No icterus.   Conjunctiva pink. PERRLA. Ears:  Normal auditory acuity. Neck:  Supple; no masses or thyroidomegaly Lungs: Respirations even and unlabored. Lungs clear to auscultation bilaterally.   No wheezes, crackles, or rhonchi.  Heart:  Regular rate and rhythm;  Without murmur, clicks, rubs or gallops Abdomen:  Soft, nondistended, nontender. Normal bowel sounds. No appreciable masses or hepatomegaly.  No rebound or guarding.  Rectal:  Not performed. Msk:  Symmetrical without gross deformities.   Extremities:  Without edema, cyanosis or clubbing. Neurologic:  Alert and oriented x3;  grossly normal neurologically. Skin:  Intact without significant lesions or rashes. Cervical Nodes:  No significant cervical adenopathy. Psych:  Alert and cooperative. Normal affect.  LAB RESULTS: Recent  Labs    02/29/24 2036  WBC 18.6*  HGB 11.0*  HCT 33.1*  PLT 443*   BMET Recent Labs    02/29/24 2036  NA 130*  K 2.6*  CL 83*  CO2 25  GLUCOSE 162*  BUN 80*  CREATININE 1.37*  CALCIUM  9.5   LFT Recent Labs    02/29/24 2036  PROT 7.0  ALBUMIN 3.4*  AST 28  ALT 8  ALKPHOS 58  BILITOT 1.5*   PT/INR No results for input(s): LABPROT, INR in the last 72 hours.  STUDIES: DG Abd Portable 1 View Result Date: 03/01/2024 CLINICAL DATA:  NG tube placement EXAM: PORTABLE ABDOMEN - 1 VIEW COMPARISON:  CT 02/29/2024 FINDINGS: NG tube tip is in the proximal to mid stomach. IMPRESSION: NG tube in the stomach. Electronically Signed   By: Franky Crease M.D.   On: 03/01/2024 01:42   CT ABDOMEN PELVIS W CONTRAST Result Date: 02/29/2024 CLINICAL DATA:  Nausea vomiting syncope EXAM: CT ABDOMEN AND PELVIS WITH  CONTRAST TECHNIQUE: Multidetector CT imaging of the abdomen and pelvis was performed using the standard protocol following bolus administration of intravenous contrast. RADIATION DOSE REDUCTION: This exam was performed according to the departmental dose-optimization program which includes automated exposure control, adjustment of the mA and/or kV according to patient size and/or use of iterative reconstruction technique. CONTRAST:  OMNIPAQUE  IOHEXOL  300 MG/ML  SOLN COMPARISON:  CT 02/23/2024, 11/25/2023 FINDINGS: Lower chest: Lung bases demonstrate no acute airspace disease. 6 mm right lower lobe anterior pulmonary nodule series 4, image 8 again noted. Coronary vascular calcification. Hepatobiliary: Hyperdense appearance of gallbladder, possible sludge. No calcified stone. No biliary dilatation Pancreas: Unremarkable. No pancreatic ductal dilatation or surrounding inflammatory changes. Spleen: Normal in size without focal abnormality. Adrenals/Urinary Tract: Stable adrenal glands. Mild left-sided hydronephrosis and hydroureter, slightly increased compared to prior, with more normal appearing caliber of distal left ureter in the pelvis, anterior to the iliac vessels. No obstructing stone. Decreased excretion of contrast on the left on delayed views consistent with obstruction. Nodular soft tissue thickening at the right posterior bladder, measuring about 35 x 16 mm on series 2, image 75, stable in the short interim. Stomach/Bowel: Interval marked fluid distension of the stomach. Irregular wall thickening of the antral pyloric region with mucosal enhancement. Decompressed duodenum. Slightly low lying ligament of Treitz probably displaced by gastric distension. Elsewhere no dilated bowel is seen. 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 Vascular/Lymphatic: Moderate aortic atherosclerosis. As seen on recent prior, multiple enlarged abdominal, retroperitoneal and pelvic lymph nodes. Gastrohepatic nodes measuring  up to 14 mm on series 2, image 27. Portal caval node measuring 21 mm on series 2, image 28. Index left iliac node measuring 22 mm on series 2, image 66, previously 21 mm. Reproductive: Stable appearance of the uterus and adnexal structures. Some asymmetrical soft tissue thickening of the right vulvar and perineal soft tissues, series 2 image 84. Other: No free air. Generalized subcutaneous edema. Slight increased ascites within the abdomen and pelvis, now small-moderate volume. Peritoneal metastatic disease as evidenced by stranding and nodularity of the omentum. Musculoskeletal: No new lesion. High-grade compression deformity at L1 unchanged IMPRESSION: 1. Interval marked fluid distension of the stomach with irregular wall thickening of the antral pyloric region with mucosal enhancement with otherwise decompressed appearance of the bowel, outlet obstruction or functional obstruction of the stomach cannot be excluded. NG decompression is recommended 2. Slight increased ascites within the abdomen and pelvis, now small-moderate volume. No free air. 3.  New mild left-sided hydronephrosis and hydroureter, with more normal appearing caliber of distal left ureter in the pelvis, anterior to the iliac vessels. No obstructing stone. Findings are consistent with distal ureteral obstruction. Decreased excretion of contrast from the left kidney on delayed views. 4. Nodular soft tissue thickening at the right posterior bladder, stable in the short interim but suspicious for neoplasm/metastatic disease. 5. Extensive abdominal, retroperitoneal and pelvic adenopathy consistent with metastatic disease. 6. 6 mm right lower lobe pulmonary nodule, stable, and also concerning for metastatic disease. 7. Aortic atherosclerosis. Aortic Atherosclerosis (ICD10-I70.0). Electronically Signed   By: Luke Bun M.D.   On: 02/29/2024 23:19   US  Venous Img Lower Unilateral Right Result Date: 02/29/2024 CLINICAL DATA:  Leg swelling for 1 day.  Fell on Monday. History of uterine cancer. EXAM: Right LOWER EXTREMITY VENOUS DOPPLER ULTRASOUND TECHNIQUE: Gray-scale sonography with compression, as well as color and duplex ultrasound, were performed to evaluate the deep venous system(s) from the level of the common femoral vein through the popliteal and proximal calf veins. COMPARISON:  None Available. FINDINGS: VENOUS Normal compressibility of the common femoral, superficial femoral, and popliteal veins, as well as the visualized calf veins. Visualized portions of profunda femoral vein and great saphenous vein unremarkable. No filling defects to suggest DVT on grayscale or color Doppler imaging. Doppler waveforms show normal direction of venous flow, normal respiratory plasticity and response to augmentation. Limited views of the contralateral common femoral vein are unremarkable. OTHER None. Limitations: none IMPRESSION: No evidence of acute deep venous thrombosis in the visualized lower extremity veins. Electronically Signed   By: Elsie Gravely M.D.   On: 02/29/2024 21:34      Impression / Plan:   Assessment: Principal Problem:   Intractable nausea and vomiting Active Problems:   Nausea & vomiting   Tonya [name] is a 58 y.o. y/o female with stage IV metastatic endometrial cancer with an abnormal CT scan of the abdomen.  The patient has had a large amount of fluid coming out of the NG aspirate after the NG tube was placed.  The patient has had chronic nausea and vomiting and now I am being asked to see the patient for possible EGD.  The patient has had ascites found on imaging also.  Plan:  The patient will be brought up for an urgent EGD to rule out gastric outlet obstruction due to the need of a PEG tube placement for possible venting.  From a GI point of view ascites is a contraindication to PEG tube placement and we will not be placing a PEG tube at the time of the EGD and will only address what has been asked of GI as to whether there  is outlet obstruction or not.  The patient has been explained the plan and agrees with it.  Thank you for involving me in the care of this patient.      LOS: 0 days   Tonya Copping, MD, MD. NOLIA 03/01/2024, 2:00 PM,  Pager [phone] 7am-5pm  Check AMION for 5pm -7am coverage and on weekends   Note: This dictation was prepared with Dragon dictation along with smaller phrase technology. Any transcriptional errors that result from this process are unintentional.

## 2024-03-01 NOTE — ED Notes (Addendum)
 This RN just drew Pt lab as this RN was tied up with another Pt. Unable to make urine at this time this RN attempted bed pan x2

## 2024-03-02 ENCOUNTER — Other Ambulatory Visit: Payer: Self-pay

## 2024-03-02 DIAGNOSIS — E876 Hypokalemia: Secondary | ICD-10-CM | POA: Diagnosis not present

## 2024-03-02 DIAGNOSIS — E871 Hypo-osmolality and hyponatremia: Secondary | ICD-10-CM

## 2024-03-02 DIAGNOSIS — D72829 Elevated white blood cell count, unspecified: Secondary | ICD-10-CM

## 2024-03-02 DIAGNOSIS — N179 Acute kidney failure, unspecified: Secondary | ICD-10-CM

## 2024-03-02 DIAGNOSIS — C541 Malignant neoplasm of endometrium: Secondary | ICD-10-CM | POA: Diagnosis not present

## 2024-03-02 DIAGNOSIS — K3189 Other diseases of stomach and duodenum: Secondary | ICD-10-CM

## 2024-03-02 DIAGNOSIS — I5022 Chronic systolic (congestive) heart failure: Secondary | ICD-10-CM

## 2024-03-02 DIAGNOSIS — E039 Hypothyroidism, unspecified: Secondary | ICD-10-CM

## 2024-03-02 DIAGNOSIS — D649 Anemia, unspecified: Secondary | ICD-10-CM

## 2024-03-02 DIAGNOSIS — R112 Nausea with vomiting, unspecified: Secondary | ICD-10-CM | POA: Diagnosis not present

## 2024-03-02 LAB — CBC
HCT: 29.7 % — ABNORMAL LOW (ref 36.0–46.0)
Hemoglobin: 10 g/dL — ABNORMAL LOW (ref 12.0–15.0)
MCH: 33.2 pg (ref 26.0–34.0)
MCHC: 33.7 g/dL (ref 30.0–36.0)
MCV: 98.7 fL (ref 80.0–100.0)
Platelets: 392 K/uL (ref 150–400)
RBC: 3.01 MIL/uL — ABNORMAL LOW (ref 3.87–5.11)
RDW: 17.2 % — ABNORMAL HIGH (ref 11.5–15.5)
WBC: 18.1 K/uL — ABNORMAL HIGH (ref 4.0–10.5)
nRBC: 0 % (ref 0.0–0.2)

## 2024-03-02 LAB — BASIC METABOLIC PANEL WITH GFR
Anion gap: 21 — ABNORMAL HIGH (ref 5–15)
BUN: 73 mg/dL — ABNORMAL HIGH (ref 6–20)
CO2: 26 mmol/L (ref 22–32)
Calcium: 8.7 mg/dL — ABNORMAL LOW (ref 8.9–10.3)
Chloride: 88 mmol/L — ABNORMAL LOW (ref 98–111)
Creatinine, Ser: 1.37 mg/dL — ABNORMAL HIGH (ref 0.44–1.00)
GFR, Estimated: 44 mL/min — ABNORMAL LOW (ref >60)
Glucose, Bld: 154 mg/dL — ABNORMAL HIGH (ref 70–99)
Potassium: 3 mmol/L — ABNORMAL LOW (ref 3.5–5.1)
Sodium: 135 mmol/L (ref 135–145)

## 2024-03-02 LAB — GLUCOSE, CAPILLARY
Glucose-Capillary: 169 mg/dL — ABNORMAL HIGH (ref 70–99)
Glucose-Capillary: 201 mg/dL — ABNORMAL HIGH (ref 70–99)

## 2024-03-02 LAB — URINALYSIS, ROUTINE W REFLEX MICROSCOPIC
Bilirubin Urine: NEGATIVE
Glucose, UA: NEGATIVE mg/dL
Hgb urine dipstick: NEGATIVE
Ketones, ur: 5 mg/dL — AB
Leukocytes,Ua: NEGATIVE
Nitrite: NEGATIVE
Protein, ur: NEGATIVE mg/dL
Specific Gravity, Urine: 1.031 — ABNORMAL HIGH (ref 1.005–1.030)
pH: 5 (ref 5.0–8.0)

## 2024-03-02 MED ORDER — SODIUM CHLORIDE 0.9% FLUSH
10.0000 mL | Freq: Two times a day (BID) | INTRAVENOUS | Status: DC
  Administered 2024-03-02: 10 mL
  Administered 2024-03-02: 20 mL
  Administered 2024-03-03: 10 mL
  Administered 2024-03-03: 20 mL
  Administered 2024-03-04 – 2024-03-08 (×8): 10 mL

## 2024-03-02 MED ORDER — LACTATED RINGERS IV SOLN: INTRAVENOUS | Status: AC

## 2024-03-02 MED ORDER — HEPARIN SOD (PORK) LOCK FLUSH 100 UNIT/ML IV SOLN: 500.0000 [IU] | INTRAVENOUS | Status: DC

## 2024-03-02 MED ORDER — POTASSIUM CHLORIDE 10 MEQ/100ML IV SOLN
10.0000 meq | INTRAVENOUS | Status: AC
  Administered 2024-03-02 (×6): 10 meq via INTRAVENOUS
  Filled 2024-03-02 (×4): qty 100

## 2024-03-02 MED ORDER — HEPARIN SOD (PORK) LOCK FLUSH 100 UNIT/ML IV SOLN
500.0000 [IU] | INTRAVENOUS | Status: DC | PRN
  Administered 2024-03-02: 500 [IU]

## 2024-03-02 MED ORDER — PHENOL 1.4 % MT LIQD
1.0000 | OROMUCOSAL | Status: DC | PRN
  Administered 2024-03-02: 1 via OROMUCOSAL
  Filled 2024-03-02: qty 177

## 2024-03-02 MED ORDER — LEVOTHYROXINE SODIUM 100 MCG/5ML IV SOLN: 200.0000 ug | Freq: Every morning | INTRAVENOUS | Status: DC

## 2024-03-02 MED ORDER — POTASSIUM CHLORIDE 10 MEQ/100ML IV SOLN
10.0000 meq | INTRAVENOUS | Status: DC
  Filled 2024-03-02: qty 100

## 2024-03-02 MED ORDER — TRACE MINERALS CU-MN-SE-ZN 300-55-60-3000 MCG/ML IV SOLN
INTRAVENOUS | Status: AC
  Filled 2024-03-02: qty 340.8

## 2024-03-02 MED ORDER — CEFAZOLIN SODIUM-DEXTROSE 2-4 GM/100ML-% IV SOLN
2.0000 g | INTRAVENOUS | Status: AC
  Administered 2024-03-05: 2 g via INTRAVENOUS
  Filled 2024-03-02: qty 100

## 2024-03-02 MED ORDER — INSULIN ASPART 100 UNIT/ML IJ SOLN
0.0000 [IU] | Freq: Four times a day (QID) | INTRAMUSCULAR | Status: DC
  Administered 2024-03-02: 3 [IU] via SUBCUTANEOUS
  Administered 2024-03-02: 2 [IU] via SUBCUTANEOUS
  Administered 2024-03-03: 3 [IU] via SUBCUTANEOUS
  Filled 2024-03-02 (×3): qty 1

## 2024-03-02 MED ORDER — MORPHINE SULFATE (PF) 2 MG/ML IV SOLN
2.0000 mg | INTRAVENOUS | Status: DC | PRN
  Administered 2024-03-02 – 2024-03-08 (×11): 2 mg via INTRAVENOUS
  Filled 2024-03-02 (×11): qty 1

## 2024-03-02 MED ORDER — SODIUM CHLORIDE 0.9% FLUSH
10.0000 mL | INTRAVENOUS | Status: DC | PRN
  Administered 2024-03-04: 10 mL

## 2024-03-02 NOTE — Hospital Course (Addendum)
 Taken from prior notes.   Kathrene Sinopoli is a 58 y.o. 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  Patient had this intermittent symptoms starting early May of this year which she initially contributed to chemotherapy.  Due to severity of symptoms and unable to tolerate any p.o. she presented to ED.    CT abdomen pelvis showed interval marked improvement with distention of the stomach with irregular wall thickening of antropyloric region with mucosal enhancement with otherwise decompressed appearance of the bowel outlet obstruction or functional obstruction of stomach cannot be excluded. Blood work showed sodium 130, potassium 2.6 chloride 83 glucose 162 BUN 80 creatinine 1.3.   NG tube was inserted and GI was consulted, patient underwent EGD which shows a bleeding obstructing mass in the gastric body, also seen in duodenal bulb and pylorus.  Biopsies were taken.  Oncology was consulted.  Initial recommendations were for palliative care but patient would like to try second agent.  Patient will likely need a venting J-tube which will be challenging due to extensive mass.  7/11: Vital stable, persistent hypokalemia at 3 which is being repleted.  Persistent leukocytosis and hemoglobin of 10.  Small improvement in creatinine to 1.37.  PICC line and TPN consult was ordered as there is no other option based on her goals of care.  Oncology is going to try a second line agent. IR will try placing a venting tube and paracentesis on Monday.  Patient is very poor prognosis.  Does not want to go on hospice until exhausted all resources, stating that she has to take care of some family matters before making that decision.

## 2024-03-02 NOTE — Assessment & Plan Note (Signed)
 Significant hypokalemia on admission with normal magnesium .  Potassium with some improvement to 3 this morning. - Replete potassium and monitor

## 2024-03-02 NOTE — Assessment & Plan Note (Signed)
 Secondary to gastric outlet obstruction with mass involving gastric body, pylorus and initial duodenum on EGD.  Likely disease progression with concern of neuroendocrine features. NG tube is in place. Patient need a venting PEG tube, not a surgical candidate, IR was consulted by oncology and they will likely attempt on Monday. - Will remain n.p.o. - PICC line was placed and starting on TPN -Continue with Reglan  and octreotide  - Continue with supportive care

## 2024-03-02 NOTE — Progress Notes (Signed)
 Peripherally Inserted Central Catheter Placement  The IV Nurse has discussed with the patient and/or persons authorized to consent for the patient, the purpose of this procedure and the potential benefits and risks involved with this procedure.  The benefits include less needle sticks, lab draws from the catheter, and the patient may be discharged home with the catheter. Risks include, but not limited to, infection, bleeding, blood clot (thrombus formation), and puncture of an artery; nerve damage and irregular heartbeat and possibility to perform a PICC exchange if needed/ordered by physician.  Alternatives to this procedure were also discussed.  Bard Power PICC patient education guide, fact sheet on infection prevention and patient information card has been provided to patient /or left at bedside.    PICC Placement Documentation  PICC Double Lumen 03/02/24 Right Brachial 37 cm 0 cm (Active)  Indication for Insertion or Continuance of Line Administration of hyperosmolar/irritating solutions (i.e. TPN, Vancomycin , etc.) 03/02/24 1000  Exposed Catheter (cm) 0 cm 03/02/24 1000  Site Assessment Clean, Dry, Intact 03/02/24 1000  Lumen #1 Status Flushed;Saline locked;Blood return noted 03/02/24 1000  Lumen #2 Status Flushed;Saline locked;Blood return noted 03/02/24 1000  Dressing Type Transparent;Securing device 03/02/24 1000  Dressing Status Antimicrobial disc/dressing in place;Clean, Dry, Intact 03/02/24 1000  Line Care Connections checked and tightened 03/02/24 1000  Line Adjustment (NICU/IV Team Only) No 03/02/24 1000  Dressing Intervention New dressing 03/02/24 1000  Dressing Change Due 03/09/24 03/02/24 1000       Tonya [name] [name] 03/02/2024, 11:00 AM

## 2024-03-02 NOTE — Assessment & Plan Note (Signed)
-   Continue home Synthroid -we will switch to IV

## 2024-03-02 NOTE — Consult Note (Signed)
 PHARMACY - TOTAL PARENTERAL NUTRITION CONSULT NOTE   Indication: Small bowel obstruction  Patient Measurements: Height: 5' 4 (162.6 cm) Weight: 81.6 kg (180 lb) IBW/kg (Calculated) : 54.7 TPN AdjBW (KG): 61.4 Body mass index is 30.9 kg/m. Usual Weight: N/A  Assessment:  58 y.o. 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    Glucose / Insulin : no history of DM Electrolytes: K 3.0(being replaced currently) Renal: Scr 1.37>1.43>137 Hepatic: AST/ALT WNL Intake / Output; MIVF: net(-2.5L), LR@100ml /hr GI Imaging: CT abdomen/pelvis 7/11:  1. Interval marked fluid distension of the stomach with irregular wall thickening of the antral pyloric region with mucosal enhancement with otherwise decompressed appearance of the bowel, outlet obstruction or functional obstruction of the stomach cannot be excluded. NG decompression is recommended 2. Slight increased ascites within the abdomen and pelvis, now small-moderate volume. No free air. 3. New mild left-sided hydronephrosis and hydroureter, with more normal appearing caliber of distal left ureter in the pelvis, anterior to the iliac vessels. No obstructing stone. Findings are consistent with distal ureteral obstruction. Decreased excretion of contrast from the left kidney on delayed views. 4. Nodular soft tissue thickening at the right posterior bladder, stable in the short interim but suspicious for neoplasm/metastatic disease. 5. Extensive abdominal, retroperitoneal and pelvic adenopathy consistent with metastatic disease. 6. 6 mm right lower lobe pulmonary nodule, stable, and also concerning for metastatic disease. 7. Aortic atherosclerosis.  GI Surgeries / Procedures:  EGD done 7/10:   Central access: ordered to be placed on 7/11 TPN start date: 7/11(pending PICC  placement  Nutritional Goals: Goal TPN rate is 60 mL/hr (provides 102 g of protein and 1946 kcals per day)  RD Assessment:  To be entered shortly  Current Nutrition:  NPO  Plan:  Start TPN at 30mL/hr at 1800 Electrolytes in TPN: Na 97mEq/L, K 31mEq/L, Ca 54mEq/L, Mg 62mEq/L, and Phos 15mmol/L. Cl:Ac 1:1 Add standard MVI and trace elements to TPN Add thiamine as well to TPN for 7 days(day 1) Initiate Sensitive q6h SSI and adjust as needed  Reduce MIVF to 40 mL/hr at 1800 Monitor TPN labs on Mon/Thurs, daily until stable  Jourdin Connors A Raneen Jaffer 03/02/2024,9:39 AM

## 2024-03-02 NOTE — Assessment & Plan Note (Signed)
 Hemoglobin of 10, likely due to ongoing chemotherapy and anemia of chronic disease. - Monitor hemoglobin -Transfuse if below 7

## 2024-03-02 NOTE — Assessment & Plan Note (Signed)
 History of SIADH with lung mets -Sodium improved to 135 today -Continue to monitor

## 2024-03-02 NOTE — Progress Notes (Signed)
 Progress Note   Patient: Tonya [name] FMW:[id number] DOB: 1965-12-10 DOA: 02/29/2024     1 DOS: the patient was seen and examined on 03/02/2024   Brief hospital course: Taken from prior notes.   Tonya [name] is a 58 y.o. 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  Patient had this intermittent symptoms starting early May of this year which she initially contributed to chemotherapy.  Due to severity of symptoms and unable to tolerate any p.o. she presented to ED.    CT abdomen pelvis showed interval marked improvement with distention of the stomach with irregular wall thickening of antropyloric region with mucosal enhancement with otherwise decompressed appearance of the bowel outlet obstruction or functional obstruction of stomach cannot be excluded. Blood work showed sodium 130, potassium 2.6 chloride 83 glucose 162 BUN 80 creatinine 1.3.   NG tube was inserted and GI was consulted, patient underwent EGD which shows a bleeding obstructing mass in the gastric body, also seen in duodenal bulb and pylorus.  Biopsies were taken.  Oncology was consulted.  Initial recommendations were for palliative care but patient would like to try second agent.  Patient will likely need a venting J-tube which will be challenging due to extensive mass.  7/11: Vital stable, persistent hypokalemia at 3 which is being repleted.  Persistent leukocytosis and hemoglobin of 10.  Small improvement in creatinine to 1.37.  PICC line and TPN consult was ordered as there is no other option based on her goals of care.  Oncology is going to try a second line agent. IR will try placing a venting tube and paracentesis on Monday.  Patient is very poor prognosis.  Does not want to go on hospice until exhausted all resources, stating that she has to take care of some family  matters before making that decision.  Assessment and Plan: * Intractable nausea and vomiting Secondary to gastric outlet obstruction with mass involving gastric body, pylorus and initial duodenum on EGD.  Likely disease progression with concern of neuroendocrine features. NG tube is in place. Patient need a venting PEG tube, not a surgical candidate, IR was consulted by oncology and they will likely attempt on Monday. - Will remain n.p.o. - PICC line was placed and starting on TPN -Continue with Reglan  and octreotide  - Continue with supportive care   Endometrial carcinoma (HCC) History of stage IV endometrial carcinoma with disease progression and neuroendocrine features. Oncology was suggesting hospice but patient would like to try second line agent and does not want to give up at this time. Very guarded prognosis. - Oncology is working on starting on topotecan likely from 03/13/2024- approval is in progress.   AKI (acute kidney injury) (HCC) Likely due to dehydration and poor p.o. intake, improving with creatinine at 1.37 today.  Baseline seems to be less than 1. -Monitor renal function -Continue with gentle IV fluid -Avoid nephrotoxins  Chronic hyponatremia History of SIADH with lung mets -Sodium improved to 135 today -Continue to monitor  Hypokalemia Significant hypokalemia on admission with normal magnesium .  Potassium with some improvement to 3 this morning. - Replete potassium and monitor  Chronic HFrEF (heart failure with reduced ejection fraction) (HCC) EF of 40% according to echo done in December 2020. Clinically appears dry. - Monitor volume status closely while she is being getting IV fluid.  Leukocytosis Likely reactive, UA was not consistent with UTI. No other obvious infection -  Continue to monitor -Keep holding antibiotics  Normocytic anemia Hemoglobin of 10, likely due to ongoing chemotherapy and anemia of chronic disease. - Monitor  hemoglobin -Transfuse if below 7  Hypothyroidism - Continue home Synthroid -we will switch to IV   Subjective: Patient was seen and examined today.  Denies any pain or more nausea/vomiting at this time.  Patient seems understanding her condition but stating that she has to take care of certain family affairs before deciding about being hospice.  Her goal is to go home with TRN and take care of her affairs.  Physical Exam: Vitals:   03/01/24 1726 03/01/24 2230 03/02/24 0346 03/02/24 0736  BP: 112/73 118/63 130/81 130/79  Pulse: 85 89 87 92  Resp: 14 17 18 16   Temp: 98.2 F (36.8 C) 98.4 F (36.9 C) 97.8 F (36.6 C) 97.9 F (36.6 C)  TempSrc: Oral Oral Oral Oral  SpO2: 95% 96% 98% 97%  Weight:      Height:       General.  Well-developed lady, in no acute distress.  NG tube in place Pulmonary.  Lungs clear bilaterally, normal respiratory effort. CV.  Regular rate and rhythm, no JVD, rub or murmur. Abdomen.  Soft, nontender, mildly distended, BS positive. CNS.  Alert and oriented .  No focal neurologic deficit. Extremities.  No edema, no cyanosis, pulses intact and symmetrical. Psychiatry.  Judgment and insight appears normal.   Data Reviewed: Prior data reviewed  Family Communication: Discussed with patient-she also discussed with family herself  Disposition: Status is: Inpatient Remains inpatient appropriate because: Severity of illness  Planned Discharge Destination: Home with home health  DVT prophylaxis.  Lovenox  Time spent: 50 minutes  This record has been created using Conservation officer, historic buildings. Errors have been sought and corrected,but may not always be located. Such creation errors do not reflect on the standard of care.   Author: Amaryllis Dare, MD 03/02/2024 1:54 PM  For on call review [email or web address].

## 2024-03-02 NOTE — Assessment & Plan Note (Signed)
 Likely reactive, UA was not consistent with UTI. No other obvious infection -Continue to monitor -Keep holding antibiotics

## 2024-03-02 NOTE — Assessment & Plan Note (Signed)
 EF of 40% according to echo done in December 2020. Clinically appears dry. - Monitor volume status closely while she is being getting IV fluid.

## 2024-03-02 NOTE — Assessment & Plan Note (Addendum)
 History of stage IV endometrial carcinoma with disease progression and neuroendocrine features. Oncology was suggesting hospice but patient would like to try second line agent and does not want to give up at this time. Very guarded prognosis. - Oncology is working on starting on topotecan likely from 03/13/2024- approval is in progress.

## 2024-03-02 NOTE — Progress Notes (Signed)
 [facility]  [phone] [phone]  ID: Tonya [name] OB: 26-Dec-1965  MR#: [id number]  RDW#:[id number]  Patient Care Team: Cleotilde Oneil FALCON, MD as PCP - General (Internal Medicine) End, Lonni, MD as PCP - Cardiology (Cardiology) Maurie Rayfield BIRCH, RN as Oncology Nurse Navigator  CHIEF COMPLAINT: Progressive endometrial cancer with neuroendocrine component.   INTERVAL HISTORY: Patient symptomatically improved today, but still has NG tube in place.  She continues to have increased weakness and fatigue, but otherwise feels well.  She denies any pain.  Patient offers no further complaints.  REVIEW OF SYSTEMS:   Review of Systems  Constitutional:  Positive for malaise/fatigue. Negative for fever and weight loss.  Respiratory: Negative.  Negative for cough, hemoptysis and shortness of breath.   Cardiovascular: Negative.  Negative for chest pain and leg swelling.  Gastrointestinal: Negative.  Negative for abdominal pain.  Genitourinary: Negative.  Negative for dysuria.  Musculoskeletal: Negative.  Negative for back pain.  Skin: Negative.  Negative for rash.  Neurological:  Positive for weakness. Negative for dizziness, focal weakness and headaches.  Psychiatric/Behavioral: Negative.  The patient is not nervous/anxious.     As per HPI. Otherwise, a complete review of systems is negative.  PAST MEDICAL HISTORY: Past Medical History:  Diagnosis Date   Cancer (HCC)    Coronary artery disease    GERD (gastroesophageal reflux disease)    Hypertension    Hypothyroidism    Myocardial infarction (HCC)    Thyroid  disease     PAST SURGICAL HISTORY: Past Surgical History:  Procedure Laterality Date   CARDIAC CATHETERIZATION     CESAREAN SECTION     CORONARY STENT INTERVENTION N/A 07/24/2019   Procedure: CORONARY STENT INTERVENTION;  Surgeon: Mady Lonni, MD;  Location: [facility] INVASIVE CV LAB;  Service: Cardiovascular;  Laterality: N/A;  RCA &  CFX   IR IMAGING GUIDED PORT INSERTION  09/23/2023   LEFT HEART CATH AND CORONARY ANGIOGRAPHY N/A 07/24/2019   Procedure: LEFT HEART CATH AND CORONARY ANGIOGRAPHY;  Surgeon: Hester Wolm PARAS, MD;  Location: [facility] INVASIVE CV LAB;  Service: Cardiovascular;  Laterality: N/A;   TONSILLECTOMY      FAMILY HISTORY: Family History  Problem Relation Age of Onset   Atrial fibrillation Mother        Passed January 2024.   Colon cancer Mother        Diagnosed 2007   Heart Problems Father    Heart attack Father    Heart disease Father     ADVANCED DIRECTIVES (Y/N):  @ADVDIR @  HEALTH MAINTENANCE: Social History   Tobacco Use   Smoking status: Never   Smokeless tobacco: Never  Vaping Use   Vaping status: Never Used  Substance Use Topics   Alcohol use: Not Currently    Comment: occ   Drug use: Never     Colonoscopy:  PAP:  Bone density:  Lipid panel:  No Known Allergies  Current Facility-Administered Medications  Medication Dose Route Frequency Provider Last Rate Last Admin   Chlorhexidine  Gluconate Cloth 2 % PADS 6 each  6 each Topical Daily Laurita Manor T, MD       dexamethasone  (DECADRON ) injection 10 mg  10 mg Intravenous Q24H Laurita Manor T, MD       enoxaparin  (LOVENOX ) injection 40 mg  40 mg Subcutaneous Q24H Laurita Manor T, MD   40 mg at 03/01/24 9062   insulin  aspart (novoLOG ) injection 0-9 Units  0-9 Units Subcutaneous Q6H Caleen Qualia, MD  lactated ringers  infusion   Intravenous Continuous Nazari, Walid A, RPH       lactated ringers  infusion   Intravenous Continuous Nazari, Walid A, RPH       metoCLOPramide  (REGLAN ) injection 5 mg  5 mg Intravenous Q6H Laurita Manor T, MD   5 mg at 03/02/24 0526   octreotide  (SANDOSTATIN ) injection 200 mcg  200 mcg Subcutaneous Q12H Laurita Manor T, MD   200 mcg at 03/01/24 2236   ondansetron  (ZOFRAN ) injection 4 mg  4 mg Intravenous Q6H PRN Laurita Manor T, MD   4 mg at 03/01/24 0931   pantoprazole  (PROTONIX ) injection 40 mg  40 mg  Intravenous Q12H Laurita Manor T, MD   40 mg at 03/01/24 2233   potassium chloride  10 mEq in 100 mL IVPB  10 mEq Intravenous Q1 Hr x 6 Amin, Sumayya, MD       sodium chloride  flush (NS) 0.9 % injection 10-40 mL  10-40 mL Intracatheter Q12H Laurita Manor T, MD   10 mL at 03/01/24 2234   sodium chloride  flush (NS) 0.9 % injection 10-40 mL  10-40 mL Intracatheter PRN Laurita Manor T, MD       sodium chloride  flush (NS) 0.9 % injection 10-40 mL  10-40 mL Intracatheter Q12H Amin, Amaryllis, MD       sodium chloride  flush (NS) 0.9 % injection 10-40 mL  10-40 mL Intracatheter PRN Caleen Amaryllis, MD       TPN ADULT (ION)   Intravenous Continuous TPN Nazari, Walid A, RPH        OBJECTIVE: Vitals:   03/02/24 0346 03/02/24 0736  BP: 130/81 130/79  Pulse: 87 92  Resp: 18 16  Temp: 97.8 F (36.6 C) 97.9 F (36.6 C)  SpO2: 98% 97%     Body mass index is 30.9 kg/m.    ECOG FS:2 - Symptomatic, <50% confined to bed  General: Well-developed, well-nourished, no acute distress. Eyes: Pink conjunctiva, anicteric sclera. HEENT: NG tube in place. Lungs: No audible wheezing or coughing. Heart: Regular rate and rhythm. Abdomen: Soft, nontender, no obvious distention. Musculoskeletal: No edema, cyanosis, or clubbing. Neuro: Alert, answering all questions appropriately. Cranial nerves grossly intact. Skin: No rashes or petechiae noted. Psych: Normal affect.   LAB RESULTS:  Lab Results  Component Value Date   NA 135 03/02/2024   K 3.0 (L) 03/02/2024   CL 88 (L) 03/02/2024   CO2 26 03/02/2024   GLUCOSE 154 (H) 03/02/2024   BUN 73 (H) 03/02/2024   CREATININE 1.37 (H) 03/02/2024   CALCIUM  8.7 (L) 03/02/2024   PROT 7.0 02/29/2024   ALBUMIN  3.4 (L) 02/29/2024   AST 28 02/29/2024   ALT 8 02/29/2024   ALKPHOS 58 02/29/2024   BILITOT 1.5 (H) 02/29/2024   GFRNONAA 44 (L) 03/02/2024   GFRAA >60 07/25/2019    Lab Results  Component Value Date   WBC 18.1 (H) 03/02/2024   NEUTROABS 8.3 (H) 02/22/2024   HGB  10.0 (L) 03/02/2024   HCT 29.7 (L) 03/02/2024   MCV 98.7 03/02/2024   PLT 392 03/02/2024     STUDIES: US  EKG SITE RITE Result Date: 03/02/2024 If Site Rite image not attached, placement could not be confirmed due to current cardiac rhythm.  DG Abd Portable 1 View Result Date: 03/01/2024 CLINICAL DATA:  NG tube placement EXAM: PORTABLE ABDOMEN - 1 VIEW COMPARISON:  CT 02/29/2024 FINDINGS: NG tube tip is in the proximal to mid stomach. IMPRESSION: NG tube in the stomach. Electronically Signed  By: Franky Crease M.D.   On: 03/01/2024 01:42   CT ABDOMEN PELVIS W CONTRAST Result Date: 02/29/2024 CLINICAL DATA:  Nausea vomiting syncope EXAM: CT ABDOMEN AND PELVIS WITH CONTRAST TECHNIQUE: Multidetector CT imaging of the abdomen and pelvis was performed using the standard protocol following bolus administration of intravenous contrast. RADIATION DOSE REDUCTION: This exam was performed according to the departmental dose-optimization program which includes automated exposure control, adjustment of the mA and/or kV according to patient size and/or use of iterative reconstruction technique. CONTRAST:  OMNIPAQUE  IOHEXOL  300 MG/ML  SOLN COMPARISON:  CT 02/23/2024, 11/25/2023 FINDINGS: Lower chest: Lung bases demonstrate no acute airspace disease. 6 mm right lower lobe anterior pulmonary nodule series 4, image 8 again noted. Coronary vascular calcification. Hepatobiliary: Hyperdense appearance of gallbladder, possible sludge. No calcified stone. No biliary dilatation Pancreas: Unremarkable. No pancreatic ductal dilatation or surrounding inflammatory changes. Spleen: Normal in size without focal abnormality. Adrenals/Urinary Tract: Stable adrenal glands. Mild left-sided hydronephrosis and hydroureter, slightly increased compared to prior, with more normal appearing caliber of distal left ureter in the pelvis, anterior to the iliac vessels. No obstructing stone. Decreased excretion of contrast on the left on  delayed views consistent with obstruction. Nodular soft tissue thickening at the right posterior bladder, measuring about 35 x 16 mm on series 2, image 75, stable in the short interim. Stomach/Bowel: Interval marked fluid distension of the stomach. Irregular wall thickening of the antral pyloric region with mucosal enhancement. Decompressed duodenum. Slightly low lying ligament of Treitz probably displaced by gastric distension. Elsewhere no dilated bowel is seen. 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 Vascular/Lymphatic: Moderate aortic atherosclerosis. As seen on recent prior, multiple enlarged abdominal, retroperitoneal and pelvic lymph nodes. Gastrohepatic nodes measuring up to 14 mm on series 2, image 27. Portal caval node measuring 21 mm on series 2, image 28. Index left iliac node measuring 22 mm on series 2, image 66, previously 21 mm. Reproductive: Stable appearance of the uterus and adnexal structures. Some asymmetrical soft tissue thickening of the right vulvar and perineal soft tissues, series 2 image 84. Other: No free air. Generalized subcutaneous edema. Slight increased ascites within the abdomen and pelvis, now small-moderate volume. Peritoneal metastatic disease as evidenced by stranding and nodularity of the omentum. Musculoskeletal: No new lesion. High-grade compression deformity at L1 unchanged IMPRESSION: 1. Interval marked fluid distension of the stomach with irregular wall thickening of the antral pyloric region with mucosal enhancement with otherwise decompressed appearance of the bowel, outlet obstruction or functional obstruction of the stomach cannot be excluded. NG decompression is recommended 2. Slight increased ascites within the abdomen and pelvis, now small-moderate volume. No free air. 3. New mild left-sided hydronephrosis and hydroureter, with more normal appearing caliber of distal left ureter in the pelvis, anterior to the iliac vessels. No obstructing stone. Findings are  consistent with distal ureteral obstruction. Decreased excretion of contrast from the left kidney on delayed views. 4. Nodular soft tissue thickening at the right posterior bladder, stable in the short interim but suspicious for neoplasm/metastatic disease. 5. Extensive abdominal, retroperitoneal and pelvic adenopathy consistent with metastatic disease. 6. 6 mm right lower lobe pulmonary nodule, stable, and also concerning for metastatic disease. 7. Aortic atherosclerosis. Aortic Atherosclerosis (ICD10-I70.0). Electronically Signed   By: Luke Bun M.D.   On: 02/29/2024 23:19   US  Venous Img Lower Unilateral Right Result Date: 02/29/2024 CLINICAL DATA:  Leg swelling for 1 day. Fell on Monday. History of uterine cancer. EXAM: Right LOWER EXTREMITY VENOUS  DOPPLER ULTRASOUND TECHNIQUE: Gray-scale sonography with compression, as well as color and duplex ultrasound, were performed to evaluate the deep venous system(s) from the level of the common femoral vein through the popliteal and proximal calf veins. COMPARISON:  None Available. FINDINGS: VENOUS Normal compressibility of the common femoral, superficial femoral, and popliteal veins, as well as the visualized calf veins. Visualized portions of profunda femoral vein and great saphenous vein unremarkable. No filling defects to suggest DVT on grayscale or color Doppler imaging. Doppler waveforms show normal direction of venous flow, normal respiratory plasticity and response to augmentation. Limited views of the contralateral common femoral vein are unremarkable. OTHER None. Limitations: none IMPRESSION: No evidence of acute deep venous thrombosis in the visualized lower extremity veins. Electronically Signed   By: Elsie Gravely M.D.   On: 02/29/2024 21:34   CT CHEST ABDOMEN PELVIS W CONTRAST Result Date: 02/24/2024 CLINICAL DATA:  Metastatic endometrial cancer, ongoing chemotherapy * Tracking Code: BO * EXAM: CT CHEST, ABDOMEN, AND PELVIS WITH CONTRAST  TECHNIQUE: Multidetector CT imaging of the chest, abdomen and pelvis was performed following the standard protocol during bolus administration of intravenous contrast. RADIATION DOSE REDUCTION: This exam was performed according to the departmental dose-optimization program which includes automated exposure control, adjustment of the mA and/or kV according to patient size and/or use of iterative reconstruction technique. CONTRAST:  OMNIPAQUE  IOHEXOL  300 MG/ML  SOLN COMPARISON:  11/25/2023 FINDINGS: CT CHEST FINDINGS Cardiovascular: Right chest port catheter. Scattered aortic atherosclerosis. Normal heart size. Three-vessel coronary artery calcifications. No pericardial effusion. Mediastinum/Nodes: Newly enlarged left axillary and subpectoral lymph nodes measuring up to 2.1 x 1.6 cm (series 2, image 12). Thyroid  gland, trachea, and esophagus demonstrate no significant findings. Lungs/Pleura: Interval resolution of previously seen left pleural effusion. Multiple new and enlarged bilateral pulmonary nodules, for example a 0.6 cm nodule of the anterior right lower lobe, previously no greater than 0.2 cm (series 3, image 88), and a new nodule in the posterior left upper lobe measuring 0.7 cm (series 3, image 54). Musculoskeletal: No chest wall abnormality. No acute osseous findings. CT ABDOMEN PELVIS FINDINGS Hepatobiliary: No solid liver abnormality is seen. No gallstones, gallbladder wall thickening, or biliary dilatation. Pancreas: Unremarkable. No pancreatic ductal dilatation or surrounding inflammatory changes. Spleen: Normal in size without significant abnormality. Adrenals/Urinary Tract: Adrenal glands are unremarkable. Kidneys are normal, without renal calculi, solid lesion, or hydronephrosis. New, enhancing nodular soft tissue along the posterior right aspect of the bladder dome measuring approximately 3.3 x 1.3 cm (series 2, image 101). Stomach/Bowel: Mucosal thickening and hyperenhancement of the stomach  (series 2, image 61). Appendix appears normal. No evidence of bowel wall thickening, distention, or inflammatory changes. Vascular/Lymphatic: Aortic atherosclerosis. Interval enlargement of numerous abdominal and pelvic lymph nodes, involving the gastrohepatic ligament, celiac, porta hepatis, portacaval, retroperitoneal, bilateral iliac, and pelvic sidewall stations. Many lymph nodes are matted, particularly in the retroperitoneum and iliac stations. Largest index left iliac nodes measure up to 3.1 x 2.1 cm, previously on measurable (series 2, image 71). Largest portacaval nodes measure up to 2.9 x 1.9 cm, previously 1.3 x 0.9 cm (series 2, image 57). Reproductive: Matted appearance of the uterus and ovaries, similar to prior examination (series 2, image 92). Other: No abdominal wall hernia or abnormality. Small volume ascites throughout the abdomen and pelvis, similar in volume to prior examination. Interval increase in very fine peritoneal and omental nodularity in the ventral abdomen (series 2, image 72). Musculoskeletal: No acute osseous findings. Unchanged high-grade wedge deformity of  L1 (series 6, image 94). IMPRESSION: 1. Interval enlargement of numerous lymph nodes throughout the chest, abdomen, and pelvis, consistent with worsened nodal metastatic disease. 2. Multiple new and enlarged bilateral pulmonary nodules, consistent with pulmonary metastatic disease. 3. Small volume ascites throughout the abdomen pelvis, similar to prior examination. Interval increase in fine peritoneal and omental nodularity in the ventral abdomen as well as new, enhancing nodular soft tissue along the posterior right aspect of the bladder dome consistent with worsened peritoneal metastatic disease. 4. Matted post treatment appearance of the uterus and ovaries, similar to prior examination. 5. Mucosal thickening and hyperenhancement of the stomach, suggestive of nonspecific infectious or inflammatory gastritis. 6. Interval  resolution of previously seen left pleural effusion. 7. Coronary artery disease. Aortic Atherosclerosis (ICD10-I70.0). Electronically Signed   By: Marolyn JONETTA Jaksch M.D.   On: 02/24/2024 16:28    ASSESSMENT: Progressive endometrial cancer with neuroendocrine component.   PLAN:    Progressive endometrial cancer with neuroendocrine component: CT scan results from February 24, 2024 reviewed independently with clear progression of disease.  EGD results yesterday revealed a large gastric mass oozing blood and a separate pyloric mass going into the duodenum and obstructing.  Patient expressed a strong desire to improve her nutrition and performance status to go home.  She also would like to attempt additional chemotherapy if possible.   She does not want hospice at this point, although expressed understanding that this may be necessary in the future.  After lengthy discussion with surgery, IR, and hospitalist, plan is to attempt a venting PEG for symptomatic relief and discharge patient home on TPN.  Appreciate dietary input.  Patient will require PICC line or TPN.  Once patient is able to go home, will arrange follow-up in the cancer center to further discuss options.   Renal insufficiency: Creatinine mildly improved to 1.37.  Continue IV fluids as ordered. Hypokalemia: Improving.  Continue to replace electrolytes as necessary. Leukocytosis: Likely reactive, monitor. Anemia: Mild, monitor.  Evalene JINNY Reusing, MD   03/02/2024 11:48 AM

## 2024-03-02 NOTE — Progress Notes (Signed)
 Initial Nutrition Assessment  DOCUMENTATION CODES:   Severe malnutrition in context of chronic illness  INTERVENTION:   TPN per [name]   Recommend thiamine 100mg  IV daily x 5-7 days   Pt at high refeed risk; recommend monitor potassium, magnesium  and phosphorus labs daily until stable  Daily weights   NUTRITION DIAGNOSIS:   Severe Malnutrition related to chronic illness as evidenced by moderate fat depletion, moderate muscle depletion, 36 percent weight loss in 10 months.  GOAL:   Patient will meet greater than or equal to 90% of their needs  MONITOR:   Labs, Weight trends, Skin, I & O's, Other (Comment) (TPN)  REASON FOR ASSESSMENT:   Consult New TPN/TNA  ASSESSMENT:   58 y/o female with h/o stage IV metastatic endometrial cancer on chemo since 2/3, hypothyroid disease, ischemic cardiomyopathy, HTN, CAD, C-diff, compression fracture, HLD, GERD, MI, CHF and SIADH who is admitted with GOO secondary to metastatic disease from large gastric & duodenal masses.  Met with pt in room today. Pt is known to this RD from a previous admission. Pt reports poor appetite and oral intake for several months. Pt reports that since January, she has lost ~100lbs. Per chart, pt is down 102lbs(36%) since October; this is severe weight loss. Pt reports that for the past two weeks, she has been having vomiting and has been unable to keep down anything but Ensure. Pt is currently NPO. NGT in place to LIS with ~242ml in the canister; pt has been drinking some juice and water .   RD discussed pt's nutritional status with medical team (oncology, hospitalist, radiology and surgery). Pt is not a candidate for any type of surgery including surgical gastrotomy tube or jejunostomy tube. Plan today is for PICC line and TPN initiation. Plan will be for patient to discharge home on TPN; this was discussed with patient today. Pt is at high refeed risk. Pt is at high risk for volume overload; will need to monitor  weights closely. Plan is for IR to attempt venting G-tube on Monday; pt with several questions regarding G-tube which were answered by this RD.   Pt will be followed by Tonya [name] and Tonya [name], RD at the [facility].   Medications reviewed and include: dexamethasone , lovenox , heparin , synthroid , reglan , octreotide , protonix , LRS @40ml /hr, KCl, TPN  Labs reviewed: K 3.0(L), BUN 73(H), creat 1.37(H) P 3.0 wnl- 7/10 Wbc- 18.1(H), Hgb 10.0(L), Hct 29.7(L)  NUTRITION - FOCUSED PHYSICAL EXAM:  Flowsheet Row Most Recent Value  Orbital Region No depletion  Upper Arm Region Moderate depletion  Thoracic and Lumbar Region No depletion  Buccal Region No depletion  Temple Region No depletion  Clavicle Bone Region Mild depletion  Clavicle and Acromion Bone Region Mild depletion  Scapular Bone Region No depletion  Dorsal Hand Moderate depletion  Patellar Region Mild depletion  Anterior Thigh Region Moderate depletion  Posterior Calf Region Moderate depletion  Edema (RD Assessment) Mild  Hair Reviewed  Eyes Reviewed  Mouth Reviewed  Skin Reviewed  Nails Reviewed   Diet Order:   Diet Order             Diet NPO time specified Except for: Sips with Meds  Diet effective midnight           Diet NPO time specified  Diet effective now                  EDUCATION NEEDS:   Education needs have been addressed  Skin:  Skin Assessment: Reviewed  RN Assessment  Last BM:  pta  Height:   Ht Readings from Last 1 Encounters:  02/29/24 5' 4 (1.626 m)    Weight:   Wt Readings from Last 1 Encounters:  02/29/24 81.6 kg    Ideal Body Weight:  54.5 kg  BMI:  Body mass index is 30.9 kg/m.  Estimated Nutritional Needs:   Kcal:  2000-2300kcal/day  Protein:  100-115g/day  Fluid:  1.4-1.6L/day  Tonya Shams MS, RD, [name] If unable to be reached, please send secure chat to RD inpatient available from 8:00a-4:00p daily

## 2024-03-02 NOTE — Consult Note (Signed)
 Chief Complaint: Patient was seen in consultation today for  Chief Complaint  Patient presents with   Progressive metastatic endometrial cancer with gastric outlet obstruction.    Referring Physician(s): Dr. Laurita   Supervising Physician: Hughes Simmonds  Patient Status: [facility] - In-pt  History of Present Illness: Tonya [name] is a 58 y.o. female with a history of CAD, NSTEMI, hypertension, type 2 diabetes, hypothyroidism, and metastatic uterine cancer who presented to the ED 02/29/24 with persistent nausea and vomiting over the last 2 weeks. CT of the abdomen pelvis showed marked fluid distention of the stomach which may represent gastric outlet obstruction versus functional obstruction. An NG tube was placed for decompression.  She underwent EGD 03/01/24 which revealed a large gastric mass oozing blood and a separate pyloric mass going into the duodenum and was obstructing. The patient has been seen by GI, Oncology and palliative care. The patient is not interested in hospice at this time and would like to attempt additional chemotherapy.   After discussions with surgery, IR, and hospitalist the plan is to attempt a venting PEG for symptomatic relief and discharge patient home on TPN.   Interventional Radiology has been asked to evaluate this patient for an image-guided gastrostomy tube for venting purposes. Imaging reviewed and procedure approved by Dr. Hughes.    Past Medical History:  Diagnosis Date   Cancer (HCC)    Coronary artery disease    GERD (gastroesophageal reflux disease)    Hypertension    Hypothyroidism    Myocardial infarction (HCC)    Thyroid  disease     Past Surgical History:  Procedure Laterality Date   CARDIAC CATHETERIZATION     CESAREAN SECTION     CORONARY STENT INTERVENTION N/A 07/24/2019   Procedure: CORONARY STENT INTERVENTION;  Surgeon: Mady Bruckner, MD;  Location: [facility] INVASIVE CV LAB;  Service: Cardiovascular;  Laterality: N/A;  RCA & CFX   IR  IMAGING GUIDED PORT INSERTION  09/23/2023   LEFT HEART CATH AND CORONARY ANGIOGRAPHY N/A 07/24/2019   Procedure: LEFT HEART CATH AND CORONARY ANGIOGRAPHY;  Surgeon: Hester Wolm PARAS, MD;  Location: [facility] INVASIVE CV LAB;  Service: Cardiovascular;  Laterality: N/A;   TONSILLECTOMY      Allergies: Patient has no known allergies.  Medications: Prior to Admission medications   Medication Sig Start Date End Date Taking? Authorizing Provider  Cholecalciferol (VITAMIN D-1000 MAX ST) 25 MCG (1000 UT) tablet Take 1,000 Units by mouth daily. Patient not taking: Reported on 02/29/2024 05/25/23   [provider]  dexamethasone  (DECADRON ) 2 MG tablet Take 1 tablet (2 mg total) by mouth daily. 02/23/24   Dasie Tinnie MATSU, NP  losartan -hydrochlorothiazide (HYZAAR) 50-12.5 MG tablet Take 1 tablet by mouth daily. Patient not taking: Reported on 02/29/2024 11/28/23 11/27/24  [provider]  metoprolol  tartrate (LOPRESSOR ) 25 MG tablet Take 0.5 tablets (12.5 mg total) by mouth 2 (two) times daily. Patient not taking: Reported on 02/29/2024 10/14/23 11/21/23  Trudy Anthony HERO, MD  Multiple Vitamin (MULTI-VITAMIN) tablet Take 1 tablet by mouth daily. Patient not taking: Reported on 02/29/2024    [provider]  Nutritional Supplements (ENSURE HIGH PROTEIN) LIQD 237 mL 2 TIMES DAILY (route: oral) Patient not taking: Reported on 02/29/2024 11/08/23   [provider]  nystatin  cream (MYCOSTATIN ) Apply topically 2 (two) times daily. Patient not taking: Reported on 02/29/2024 11/07/23   Dorinda Drue DASEN, MD  pantoprazole  (PROTONIX ) 20 MG tablet Take 1 tablet (20 mg total) by mouth daily. For acid reflux  02/20/24   Dasie Tinnie MATSU, NP  potassium chloride  (KLOR-CON  M) 10 MEQ tablet Take 2 tablets (20 mEq total) by mouth 2 (two) times daily. Patient not taking: Reported on 02/29/2024 12/05/23   Dasie Tinnie MATSU, NP  sodium chloride  1 g tablet Take 1 tablet (1 g total) by mouth 2 (two) times daily with a  meal. Patient not taking: Reported on 02/29/2024 10/06/23   Kandis Devaughn Sayres, MD  thyroid  (ARMOUR) 120 MG tablet Take 120 mg by mouth daily before breakfast.    [provider]  Vitamin D, Ergocalciferol, (DRISDOL) 1.25 MG (50000 UNIT) CAPS capsule Take 50,000 Units by mouth once a week. Patient not taking: Reported on 02/29/2024 08/03/23   [provider]     Family History  Problem Relation Age of Onset   Atrial fibrillation Mother        Passed January 2024.   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[employer])  Tobacco Use   Smoking status: Never   Smokeless tobacco: Never  Vaping Use   Vaping status: Never Used  Substance and Sexual Activity   Alcohol use: Not Currently    Comment: occ   Drug use: Never   Sexual activity: Not Currently  Other Topics Concern   Not on file  Social History Narrative   Not on file   Social Drivers of Health   Financial Resource Strain: Low Risk  (07/23/2019)   Overall Financial Resource Strain (CARDIA)    Difficulty of Paying Living Expenses: Not hard at all  Food Insecurity: No Food Insecurity (03/01/2024)   Hunger Vital Sign    Worried About Running Out of Food in the Last Year: Never true    Ran Out of Food in the Last Year: Never true  Transportation Needs: No Transportation Needs (03/01/2024)   PRAPARE - Administrator, Civil Service (Medical): No    Lack of Transportation (Non-Medical): No  Physical Activity: Unknown (07/23/2019)   Exercise Vital Sign    Days of Exercise per Week: 2 days    Minutes of Exercise per Session: Not on file  Stress: No Stress Concern Present (07/23/2019)    [employer] of Occupational Health - Occupational Stress Questionnaire    Feeling of Stress : Not at all  Social Connections: Moderately Integrated (10/11/2023)   Social Connection and Isolation Panel    Frequency of Communication with Friends and Family: More than three times a week    Frequency of Social Gatherings with Friends and Family: Twice a week    Attends Religious Services: 1 to 4 times per year    Active Member of [employer] or Organizations: No    Attends Banker Meetings: Never    Marital Status: Married    Review of Systems: A 12 point ROS discussed and pertinent positives are indicated in the HPI above.  All other systems are negative.  Review of Systems  Constitutional:  Positive for appetite change.  Gastrointestinal:  Negative for abdominal pain, nausea and vomiting.  All other systems reviewed and  are negative.   Vital Signs: BP 130/79 (BP Location: Left Arm)   Pulse 92   Temp 97.9 F (36.6 C) (Oral)   Resp 16   Ht 5' 4 (1.626 m)   Wt 180 lb (81.6 kg)   LMP 07/22/2016   SpO2 97%   BMI 30.90 kg/m   Physical Exam Constitutional:      General: She is not in acute distress.    Appearance: She is not ill-appearing.  HENT:     Nose:     Comments: NG tube in place    Mouth/Throat:     Mouth: Mucous membranes are moist.     Pharynx: Oropharynx is clear.  Cardiovascular:     Rate and Rhythm: Normal rate.     Comments: Right upper arm PICC  Pulmonary:     Effort: Pulmonary effort is normal.  Abdominal:     Tenderness: There is no abdominal tenderness.  Neurological:     Mental Status: She is alert and oriented to person, place, and time.      Labs:  CBC: Recent Labs    02/13/24 1445 02/22/24 1213 02/29/24 2036 03/02/24 0530  WBC 12.5* 10.9* 18.6* 18.1*  HGB 9.1* 10.3* 11.0* 10.0*  HCT 26.4* 30.5* 33.1* 29.7*  PLT 374 450* 443* 392    COAGS: No results for input(s): INR, APTT in the last 8760 hours.  BMP: Recent  Labs    02/22/24 1213 02/29/24 2036 03/01/24 1315 03/02/24 0530  NA 128* 130* 130* 135  K 3.3* 2.6* 2.8* 3.0*  CL 86* 83* 82* 88*  CO2 26 25 29 26   GLUCOSE 132* 162* 118* 154*  BUN 30* 80* 73* 73*  CALCIUM  9.8 9.5 9.1 8.7*  CREATININE 0.72 1.37* 1.43* 1.37*  GFRNONAA >60 44* 42* 44*    LIVER FUNCTION TESTS: Recent Labs    01/18/24 1347 01/23/24 0919 02/13/24 1438 02/29/24 2036  BILITOT 1.3* 1.0 1.0 1.5*  AST 20 23 21 28   ALT 12 11 11 8   ALKPHOS 50 49 58 58  PROT 7.2 7.5 7.6 7.0  ALBUMIN  4.0 3.7 4.0 3.4*    TUMOR MARKERS: No results for input(s): AFPTM, CEA, CA199, CHROMGRNA in the last 8760 hours.  Assessment and Plan:  Progressive metastatic endometrial cancer with gastric outlet obstruction: Luvenia Cranford, 58 year old female, is tentatively scheduled Monday, July 14, for an image-guided gastrostomy tube with possible paracentesis. Her imaging shows ascites which will need to be drained prior to gastrostomy tube placement. The patient was made aware of the additional risks of gastrostomy tube placement with ascites. Risks include bacterial peritonitis and failure of gastrostomy tube to adequately adhere to the abdominal wall. The patient expressed understanding and wishes to proceed.   Risks and benefits image guided gastrostomy tube placement was discussed with the patient including, but not limited to the need for a barium enema during the procedure, bleeding, infection, peritonitis and/or damage to adjacent structures.  All of the patient's questions were answered, patient is agreeable to proceed. She will be NPO at midnight Sunday night. CBC and PT/INR will be ordered for Monday morning. Lovenox  will be held.   Consent signed and in chart.  Thank you for this interesting consult.  I greatly enjoyed meeting Maizy Davanzo and look forward to participating in their care.  A copy of this report was sent to the requesting provider on this date.  Electronically  Signed: Warren Dais, AGACNP-BC 03/02/2024, 12:24 PM   I spent a total of 20 Minutes  in face to face in clinical consultation, greater than 50% of which was counseling/coordinating care for metastatic endometrial cancer.

## 2024-03-02 NOTE — Assessment & Plan Note (Signed)
 Likely due to dehydration and poor p.o. intake, improving with creatinine at 1.37 today.  Baseline seems to be less than 1. -Monitor renal function -Continue with gentle IV fluid -Avoid nephrotoxins

## 2024-03-02 NOTE — Progress Notes (Signed)
 The patient had upper endoscopy yesterday with a mass seen in the gastric body with a lesion also seen in the duodenal bulb and pylorus.  Biopsies were taken of these lesions.  There is nothing further to do from a GI point of view.  I will sign off.  Please call if any further GI concerns or questions.  We would like to thank you for the opportunity to participate in the care of Tonya [name].

## 2024-03-03 ENCOUNTER — Inpatient Hospital Stay (HOSPITAL_COMMUNITY)
Admit: 2024-03-03 | Discharge: 2024-03-03 | Disposition: A | Discharge status: Home / Self Care | Attending: Internal Medicine

## 2024-03-03 DIAGNOSIS — C541 Malignant neoplasm of endometrium: Secondary | ICD-10-CM | POA: Diagnosis not present

## 2024-03-03 DIAGNOSIS — R9431 Abnormal electrocardiogram [ECG] [EKG]: Secondary | ICD-10-CM

## 2024-03-03 DIAGNOSIS — E43 Unspecified severe protein-calorie malnutrition: Secondary | ICD-10-CM | POA: Insufficient documentation

## 2024-03-03 DIAGNOSIS — R112 Nausea with vomiting, unspecified: Secondary | ICD-10-CM | POA: Diagnosis not present

## 2024-03-03 DIAGNOSIS — K3189 Other diseases of stomach and duodenum: Secondary | ICD-10-CM | POA: Diagnosis not present

## 2024-03-03 DIAGNOSIS — I5022 Chronic systolic (congestive) heart failure: Secondary | ICD-10-CM

## 2024-03-03 DIAGNOSIS — E876 Hypokalemia: Secondary | ICD-10-CM | POA: Diagnosis not present

## 2024-03-03 LAB — BASIC METABOLIC PANEL WITH GFR
Anion gap: 10 (ref 5–15)
BUN: 62 mg/dL — ABNORMAL HIGH (ref 6–20)
CO2: 33 mmol/L — ABNORMAL HIGH (ref 22–32)
Calcium: 8.8 mg/dL — ABNORMAL LOW (ref 8.9–10.3)
Chloride: 91 mmol/L — ABNORMAL LOW (ref 98–111)
Creatinine, Ser: 1.26 mg/dL — ABNORMAL HIGH (ref 0.44–1.00)
GFR, Estimated: 49 mL/min — ABNORMAL LOW (ref >60)
Glucose, Bld: 192 mg/dL — ABNORMAL HIGH (ref 70–99)
Potassium: 3.7 mmol/L (ref 3.5–5.1)
Sodium: 134 mmol/L — ABNORMAL LOW (ref 135–145)

## 2024-03-03 LAB — GLUCOSE, CAPILLARY
Glucose-Capillary: 201 mg/dL — ABNORMAL HIGH (ref 70–99)
Glucose-Capillary: 213 mg/dL — ABNORMAL HIGH (ref 70–99)
Glucose-Capillary: 232 mg/dL — ABNORMAL HIGH (ref 70–99)
Glucose-Capillary: 257 mg/dL — ABNORMAL HIGH (ref 70–99)

## 2024-03-03 LAB — MAGNESIUM: Magnesium: 2.6 mg/dL — ABNORMAL HIGH (ref 1.7–2.4)

## 2024-03-03 LAB — PHOSPHORUS: Phosphorus: 2.2 mg/dL — ABNORMAL LOW (ref 2.5–4.6)

## 2024-03-03 MED ORDER — PERFLUTREN LIPID MICROSPHERE
1.0000 mL | INTRAVENOUS | Status: AC | PRN
  Administered 2024-03-03: 4 mL via INTRAVENOUS

## 2024-03-03 MED ORDER — INSULIN ASPART 100 UNIT/ML IJ SOLN
0.0000 [IU] | INTRAMUSCULAR | Status: DC
  Administered 2024-03-03: 3 [IU] via SUBCUTANEOUS
  Filled 2024-03-03: qty 1

## 2024-03-03 MED ORDER — INSULIN ASPART 100 UNIT/ML IJ SOLN
0.0000 [IU] | Freq: Four times a day (QID) | INTRAMUSCULAR | Status: DC
  Administered 2024-03-03: 5 [IU] via SUBCUTANEOUS
  Administered 2024-03-03: 3 [IU] via SUBCUTANEOUS
  Filled 2024-03-03 (×2): qty 1

## 2024-03-03 MED ORDER — TRACE MINERALS CU-MN-SE-ZN 300-55-60-3000 MCG/ML IV SOLN
INTRAVENOUS | Status: AC
  Filled 2024-03-03: qty 738.4

## 2024-03-03 NOTE — TOC Progression Note (Signed)
 Transition of Care [facility]) - Progression Note    Patient Details  Name: Tonya [name] MRN: [id number] Date of Birth: [birth year]  Transition of Care [facility]) CM/SW Contact  [place] L Marializ Fer[place], [place] Phone Number: 03/03/2024, 2:17 PM  Clinical Narrative:     CSW completed brief assessment with patient. Home Health needs reviewed. Patient previously received PT/OT with [facility]. Patient advised that if Home Care is recommended, she would like services to be set up with [facility].   Husband will transport when patient is ready for discharge. Patient resides in home with spouse. Prescriptions are fulfilled [facility].   DME discussed. Patient has a wheelchair at home. She advised that she would need an IV pole for home. She reported that she will most likely need home IV infusion. Patient also advised that she may need a BSC.        Expected Discharge Plan and Services                                               Social Determinants of Health (SDOH) Interventions SDOH Screenings   Food Insecurity: No Food Insecurity (03/01/2024)  Housing: Low Risk  (03/01/2024)  Transportation Needs: No Transportation Needs (03/01/2024)  Utilities: Not At Risk (03/01/2024)  Depression (PHQ2-9): Low Risk  (02/22/2024)  Financial Resource Strain: Low Risk  (07/23/2019)  Physical Activity: Unknown (07/23/2019)  Social Connections: Moderately Integrated (10/11/2023)  Stress: No Stress Concern Present (07/23/2019)  Tobacco Use: Low Risk  (02/29/2024)  Health Literacy: Adequate Health Literacy (09/29/2023)    Readmission Risk Interventions    03/03/2024    2:15 PM 10/05/2023    2:26 PM  Readmission Risk Prevention Plan  Transportation Screening Complete Complete  HRI or Home Care Consult  Complete  Palliative Care Screening  Complete  Medication Review (RN Care Manager) Complete Complete  PCP or Specialist appointment within 3-5 days of discharge Complete   HRI or Home Care Consult Complete   Palliative  Care Screening Not Applicable   Skilled Nursing Facility Not Applicable

## 2024-03-03 NOTE — Progress Notes (Signed)
**Note Tonya-Identified via Obfuscation**  Occupational Therapy Evaluation Patient Details Name: Tonya [name] MRN: [id number] DOB: Sep 29, 1965 Today's Date: 03/03/2024   History of Present Illness   Pt is a 58 y.o. female who presents with persistent nausea and vomiting over the last 2 weeks. She underwent EGD 03/01/24 which revealed a large gastric mass oozing blood and a separate pyloric mass going into the duodenum and was obstructing. PMH: CAD, NSTEMI, hypertension, type 2 diabetes, hypothyroidism, and metastatic uterine cancer     Clinical Impressions [name] was seen for OT evaluation this date. Prior to hospital admission, pt needed assistance w/ ADLs and IADLs; reports using a wc for community ambulation and RW at home. Pt lives w/ spouse. Pt presents to acute OT demonstrating impaired ADL performance and functional mobility 2/2 generalized weakness and decreased activity tolerance (See OT problem list for additional functional deficits). Pt currently requires MIN A for bed mobility; MIN A + RW for initial STS, improving to CGA for second and step pivot t/f to chair. Pt deferred further mobility due to fatigue; educated on importance of mobility for improved strength and OT role .  Pt would benefit from skilled OT services to address noted impairments and functional limitations (see below for any additional details) in order to maximize safety and independence while minimizing falls risk and caregiver burden. Anticipate the need for follow up OT services upon acute hospital DC.      If plan is discharge home, recommend the following:   A lot of help with walking and/or transfers;A lot of help with bathing/dressing/bathroom;Assistance with cooking/housework;Assist for transportation;Help with stairs or ramp for entrance     Functional Status Assessment   Patient has had a recent decline in their functional status and/or demonstrates limited ability to make significant improvements in function in a reasonable and predictable  amount of time     Equipment Recommendations   BSC/3in1     Recommendations for Other Services         Precautions/Restrictions   Precautions Precautions: Fall Recall of Precautions/Restrictions: Intact Restrictions Weight Bearing Restrictions Per Provider Order: No     Mobility Bed Mobility Overal bed mobility: Needs Assistance Bed Mobility: Supine to Sit     Supine to sit: Min assist, HOB elevated (+ HHA)          Transfers Overall transfer level: Needs assistance Equipment used: Rolling walker (2 wheels) Transfers: Sit to/from Stand, Bed to chair/wheelchair/BSC Sit to Stand: Contact guard assist     Step pivot transfers: Min assist            Balance Overall balance assessment: Needs assistance Sitting-balance support: Feet supported Sitting balance-Leahy Scale: Fair     Standing balance support: No upper extremity supported Standing balance-Leahy Scale: Poor                             ADL either performed or assessed with clinical judgement   ADL Overall ADL's : Needs assistance/impaired                                       General ADL Comments: MIN A for simulated BSC t/f     Vision         Perception         Praxis         Pertinent Vitals/Pain Pain Assessment Pain Assessment: 0-10 Pain Score:  4  Pain Location: lower back Pain Descriptors / Indicators: Aching, Discomfort, Grimacing Pain Intervention(s): Limited activity within patient's tolerance, Monitored during session, Repositioned     Extremity/Trunk Assessment Upper Extremity Assessment Upper Extremity Assessment: Generalized weakness   Lower Extremity Assessment Lower Extremity Assessment: Generalized weakness       Communication Communication Communication: No apparent difficulties   Cognition Arousal: Alert Behavior During Therapy: WFL for tasks assessed/performed Cognition: No apparent impairments                                Following commands: Intact       Cueing  General Comments   Cueing Techniques: Verbal cues;Gestural cues      Exercises     Shoulder Instructions      Home Living Family/patient expects to be discharged to:: Private residence Living Arrangements: Spouse/significant other Available Help at Discharge: Family;Available PRN/intermittently Type of Home: House Home Access: Ramped entrance (+1 step at threshold)     Home Layout: One level     Bathroom Shower/Tub: Tub/shower unit;Sponge bathes at baseline   Bathroom Toilet: Handicapped height Bathroom Accessibility: No   Home Equipment: Agricultural consultant (2 wheels);Shower seat;Wheelchair - manual   Additional Comments: sleeps in a lift chair; uses wc for community ambulation and RW for household ambulation      Prior Functioning/Environment Prior Level of Function : Working/employed;Needs assist;History of Falls (last six months) (works from home)               ADLs Comments: pt reports needing assistance w/ ADLs    OT Problem List: Decreased strength;Decreased activity tolerance;Impaired balance (sitting and/or standing)   OT Treatment/Interventions: Self-care/ADL training;Therapeutic exercise;Energy conservation;Patient/family education      OT Goals(Current goals can be found in the care plan section)   Acute Rehab OT Goals Patient Stated Goal: to go home OT Goal Formulation: With patient/family Time For Goal Achievement: 03/17/24 Potential to Achieve Goals: Good ADL Goals Pt Will Perform Grooming: with set-up;sitting;standing Pt Will Perform Lower Body Dressing: with supervision;sitting/lateral leans;sit to/from stand Pt Will Transfer to Toilet: with contact guard assist;ambulating;regular height toilet   OT Frequency:  Min 3X/week    Co-evaluation              AM-PAC OT 6 Clicks Daily Activity     Outcome Measure Help from another person eating meals?: None Help from  another person taking care of personal grooming?: A Little Help from another person toileting, which includes using toliet, bedpan, or urinal?: A Lot Help from another person bathing (including washing, rinsing, drying)?: A Lot Help from another person to put on and taking off regular upper body clothing?: A Little Help from another person to put on and taking off regular lower body clothing?: A Lot 6 Click Score: 16   End of Session Equipment Utilized During Treatment: Gait belt;Rolling walker (2 wheels)  Activity Tolerance: Patient limited by fatigue Patient left: in chair;with call bell/phone within reach;with chair alarm set;with family/visitor present;with nursing/sitter in room  OT Visit Diagnosis: Unsteadiness on feet (R26.81);Other abnormalities of gait and mobility (R26.89);History of falling (Z91.81);Muscle weakness (generalized) (M62.81)                Time: 1053-1140 OT Time Calculation (min): 47 min Charges:  OT General Charges $OT Visit: 1 Visit OT Evaluation $OT Eval Moderate Complexity: 1 Mod OT Treatments $Self Care/Home Management : 23-37 mins  [place], Student OT  Zuleyma Scharf 03/03/2024, 1:02 PM

## 2024-03-03 NOTE — Assessment & Plan Note (Signed)
 Significant hypokalemia on admission with normal magnesium .  Potassium improved to 3.7 today - Continue to monitor and replete as needed

## 2024-03-03 NOTE — Consult Note (Signed)
 PHARMACY - TOTAL PARENTERAL NUTRITION CONSULT NOTE   Indication: Small bowel obstruction  Patient Measurements: Height: 5' 4 (162.6 cm) Weight: 82 kg (180 lb 12.4 oz) IBW/kg (Calculated) : 54.7 TPN AdjBW (KG): 61.4 Body mass index is 31.03 kg/m. Usual Weight: N/A  Assessment:  58 y.o. 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    Glucose / Insulin : no history of DM Electrolytes: K 3.0(being replaced currently) Renal: Scr 1.37>1.43>137 Hepatic: AST/ALT WNL Intake / Output; MIVF: net(-2.5L), LR@100ml /hr GI Imaging: CT abdomen/pelvis 7/11:  1. Interval marked fluid distension of the stomach with irregular wall thickening of the antral pyloric region with mucosal enhancement with otherwise decompressed appearance of the bowel, outlet obstruction or functional obstruction of the stomach cannot be excluded. NG decompression is recommended 2. Slight increased ascites within the abdomen and pelvis, now small-moderate volume. No free air. 3. New mild left-sided hydronephrosis and hydroureter, with more normal appearing caliber of distal left ureter in the pelvis, anterior to the iliac vessels. No obstructing stone. Findings are consistent with distal ureteral obstruction. Decreased excretion of contrast from the left kidney on delayed views. 4. Nodular soft tissue thickening at the right posterior bladder, stable in the short interim but suspicious for neoplasm/metastatic disease. 5. Extensive abdominal, retroperitoneal and pelvic adenopathy consistent with metastatic disease. 6. 6 mm right lower lobe pulmonary nodule, stable, and also concerning for metastatic disease. 7. Aortic atherosclerosis.  GI Surgeries / Procedures:  EGD done 7/10:   Central access: 7/11 TPN start date: 7/11  Nutritional Goals: Goal TPN rate is 65  mL/hr (provides 111 g of protein and 2102 kcals per day)  RD Assessment: Estimated Needs Total Energy Estimated Needs: 2000-2300kcal/day Total Protein Estimated Needs: 100-115g/day Total Fluid Estimated Needs: 1.4-1.6L/dayTo be entered shortly  Current Nutrition:  NPO  Plan:  Increase TPN to goal rate of 20mL/hr at 1800 Electrolytes in TPN: Na 44mEq/L, K 68mEq/L, Ca 81mEq/L, Mg 33mEq/L, and Phos 15mmol/L. Cl:Ac 1:1 Add standard MVI and trace elements to TPN Add thiamine as well to TPN for 7 days(day 1) Initiate Sensitive q6h SSI and adjust as needed  Stop MIVF at 1800 today since at goal rate Monitor TPN labs on Mon/Thurs, daily until stable  Gwendoline Judy A Webster Patrone 03/03/2024,10:53 AM

## 2024-03-03 NOTE — Assessment & Plan Note (Signed)
 Secondary to gastric outlet obstruction with mass involving gastric body, pylorus and initial duodenum on EGD.  Likely disease progression with concern of neuroendocrine features. S/p venting PEG tube placement and paracentesis with IR today - Will remain n.p.o. except some sips of water  - PICC line was placed and starting on TPN -Continue with Reglan  and octreotide  - Continue with supportive care

## 2024-03-03 NOTE — Progress Notes (Signed)
 Progress Note   Patient: Tonya [name] FMW:[id number] DOB: [birth year] DOA: 02/29/2024     2 DOS: the patient was seen and examined on 03/03/2024   Brief hospital course: Taken from prior notes.   Tonya [name] is a 58 y.o. 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  Patient had this intermittent symptoms starting early May of this year which she initially contributed to chemotherapy.  Due to severity of symptoms and unable to tolerate any p.o. she presented to ED.    CT abdomen pelvis showed interval marked improvement with distention of the stomach with irregular wall thickening of antropyloric region with mucosal enhancement with otherwise decompressed appearance of the bowel outlet obstruction or functional obstruction of stomach cannot be excluded. Blood work showed sodium 130, potassium 2.6 chloride 83 glucose 162 BUN 80 creatinine 1.3.   NG tube was inserted and GI was consulted, patient underwent EGD which shows a bleeding obstructing mass in the gastric body, also seen in duodenal bulb and pylorus.  Biopsies were taken.  Oncology was consulted.  Initial recommendations were for palliative care but patient would like to try second agent.  Patient will likely need a venting J-tube which will be challenging due to extensive mass.  7/11: Vital stable, persistent hypokalemia at 3 which is being repleted.  Persistent leukocytosis and hemoglobin of 10.  Small improvement in creatinine to 1.37.  PICC line and TPN consult was ordered as there is no other option based on her goals of care.  Oncology is going to try a second line agent. IR will try placing a venting tube and paracentesis on Monday.  Patient has very poor prognosis.  Does not want to go on hospice until exhausted all resources, stating that she has to take care of some  family matters before making that decision.  7/12: Hemodynamically stable, started on TPN.  Going for reventing PEG tube placement and paracentesis with IR on Monday.  Echocardiogram was ordered to reassess her prior HFrEF.  Patient did not follow-up with cardiology after PCI.  Assessment and Plan: * Intractable nausea and vomiting Secondary to gastric outlet obstruction with mass involving gastric body, pylorus and initial duodenum on EGD.  Likely disease progression with concern of neuroendocrine features. NG tube is in place. Patient need a venting PEG tube, not a surgical candidate, IR was consulted by oncology and they will likely attempt on Monday. - Will remain n.p.o. except some sips of water  - PICC line was placed and starting on TPN -Continue with Reglan  and octreotide  - Continue with supportive care   Endometrial carcinoma (HCC) History of stage IV endometrial carcinoma with disease progression and neuroendocrine features. Oncology was suggesting hospice but patient would like to try second line agent and does not want to give up at this time. Very guarded prognosis. - Oncology is working on starting on topotecan likely from 03/13/2024- approval is in progress.   AKI (acute kidney injury) (HCC) Likely due to dehydration and poor p.o. intake, improving with creatinine at 1.26 today.  Baseline seems to be less than 1. -Monitor renal function -Avoid nephrotoxins  Chronic hyponatremia History of SIADH with lung mets -Sodium improved to 134 today -Continue to monitor  Hypokalemia Significant hypokalemia on admission with normal magnesium .  Potassium improved to 3.7 today - Continue to monitor and replete as needed  Chronic HFrEF (heart failure with reduced ejection fraction) (HCC) EF  of 40% according to echo done in December 2020. Clinically appears dry. -Repeat echocardiogram as she did not follow-up with cardiology after her PCI - Monitor volume status closely while she  is being getting IV fluid.  Leukocytosis Likely reactive, UA was not consistent with UTI. No other obvious infection -Continue to monitor -Keep holding antibiotics  Normocytic anemia Hemoglobin of 10, likely due to ongoing chemotherapy and anemia of chronic disease. - Monitor hemoglobin -Transfuse if below 7  Hypothyroidism - Continue home Synthroid -we will switch to IV   Subjective: Patient was seen and examined today.  Denies any pain except mild backache, stating it is due to lying down in the bed.  She was complaining of dry mouth and throat and asking sips of water .  Physical Exam: Vitals:   03/02/24 2050 03/03/24 0420 03/03/24 0802 03/03/24 1537  BP: 127/76 (!) 145/84 (!) 139/91 (!) 144/89  Pulse: 87 89 94 87  Resp: 18 18 16 18   Temp: 98.2 F (36.8 C) 97.8 F (36.6 C) 97.9 F (36.6 C)   TempSrc: Oral Oral Oral   SpO2: 95% 96% 96% 94%  Weight:  82 kg    Height:       General.  Well-developed lady, in no acute distress.  NG tube in place Pulmonary.  Lungs clear bilaterally, normal respiratory effort. CV.  Regular rate and rhythm, no JVD, rub or murmur. Abdomen.  Soft, nontender, mildly distended, BS positive. CNS.  Alert and oriented .  No focal neurologic deficit. Extremities.  No edema, no cyanosis, pulses intact and symmetrical. Psychiatry.  Judgment and insight appears normal.   Data Reviewed: Prior data reviewed  Family Communication: Discussed with husband at bedside  Disposition: Status is: Inpatient Remains inpatient appropriate because: Severity of illness  Planned Discharge Destination: Home with home health  DVT prophylaxis.  Lovenox  Time spent: 50 minutes  This record has been created using Conservation officer, historic buildings. Errors have been sought and corrected,but may not always be located. Such creation errors do not reflect on the standard of care.   Author: Amaryllis Dare, MD 03/03/2024 4:56 PM  For on call review [email or web address].

## 2024-03-03 NOTE — Plan of Care (Signed)
  Problem: Education: Goal: Knowledge of General Education information will improve Description: Including pain rating scale, medication(s)/side effects and non-pharmacologic comfort measures Outcome: Progressing   Problem: Clinical Measurements: Goal: Ability to maintain clinical measurements within normal limits will improve Outcome: Progressing Goal: Will remain free from infection Outcome: Progressing Goal: Diagnostic test results will improve Outcome: Progressing   Problem: Elimination: Goal: Will not experience complications related to bowel motility Outcome: Progressing Goal: Will not experience complications related to urinary retention Outcome: Progressing   Problem: Pain Managment: Goal: General experience of comfort will improve and/or be controlled Outcome: Progressing   Problem: Safety: Goal: Ability to remain free from injury will improve Outcome: Progressing   Problem: Education: Goal: Knowledge of General Education information will improve Description: Including pain rating scale, medication(s)/side effects and non-pharmacologic comfort measures Outcome: Progressing   Problem: Clinical Measurements: Goal: Ability to maintain clinical measurements within normal limits will improve Outcome: Progressing Goal: Will remain free from infection Outcome: Progressing Goal: Diagnostic test results will improve Outcome: Progressing   Problem: Elimination: Goal: Will not experience complications related to bowel motility Outcome: Progressing Goal: Will not experience complications related to urinary retention Outcome: Progressing   Problem: Pain Managment: Goal: General experience of comfort will improve and/or be controlled Outcome: Progressing   Problem: Safety: Goal: Ability to remain free from injury will improve Outcome: Progressing

## 2024-03-03 NOTE — Evaluation (Signed)
 Physical Therapy Evaluation Patient Details Name: Tonya [name] MRN: [id number] DOB: 05/17/1966 Today's Date: 03/03/2024  History of Present Illness  Pt is a 58 y.o. female who presents with persistent nausea and vomiting over the last 2 weeks. She underwent EGD 03/01/24 which revealed a large gastric mass oozing blood and a separate pyloric mass going into the duodenum and was obstructing. PMH: CAD, NSTEMI, hypertension, type 2 diabetes, hypothyroidism, and metastatic uterine cancer\   Clinical Impression  Pt received sitting in recliner and is agreeable for PT evaluation. At baseline, pt is mod I for limited household ambulation with AD and community mobility with [facility]; spouse assists with IADL's, ADL's, and medication management.   Pt presents with generalized weakness, decreased activity tolerance, tachycardia, and decreased standing balance, resulting in impaired functional mobility from baseline. Due to deficits, pt required CGA for transfers and limited gait with RW to/from [facility] for attempted toileting. RPE of 7-8/10 indicating vigorous activity after minimal mobility with elevated HR in low 100's.  Deficits limit the pt's ability to safely and independently perform ADL's, transfer, and ambulate. Pt will benefit from acute skilled PT services to address deficits for return to baseline function. Pt will benefit from post acute therapy services to address deficits for return to baseline function.         If plan is discharge home, recommend the following: A little help with walking and/or transfers;A little help with bathing/dressing/bathroom;Assistance with cooking/housework;Assist for transportation;Help with stairs or ramp for entrance   Can travel by private vehicle        Equipment Recommendations BSC/3in1  Recommendations for Other Services       Functional Status Assessment Patient has had a recent decline in their functional status and demonstrates the ability to make  significant improvements in function in a reasonable and predictable amount of time.     Precautions / Restrictions Precautions Precautions: Fall Recall of Precautions/Restrictions: Intact Restrictions Weight Bearing Restrictions Per Provider Order: No Other Position/Activity Restrictions: NG tube, TPN feeds      Mobility  Bed Mobility               General bed mobility comments: seated in recliner upon entry    Transfers       Sit to Stand: Contact guard assist           General transfer comment: CGA for safety to stand from recliner and BSC with Rw, demonstrates good safety awareness with hand placement and good eccentric lowering with proper hand placement.    Ambulation/Gait   Gait Distance (Feet): 2 Feet           General Gait Details: CGA for safety to ambulate to/from [facility] with RW. Demonstrates decreased step length/foot clearance, and increased c/o fatigue after standing >30s.     Balance Overall balance assessment: Needs assistance Sitting-balance support: Feet supported Sitting balance-Leahy Scale: Fair     Standing balance support: During functional activity, Reliant on assistive device for balance Standing balance-Leahy Scale: Fair Standing balance comment: standing balance with RW                             Pertinent Vitals/Pain Pain Assessment Pain Score: 4  Pain Location: lower back Pain Descriptors / Indicators: Aching, Discomfort, Grimacing Pain Intervention(s): Monitored during session, Repositioned    Home Living Family/patient expects to be discharged to:: Private residence Living Arrangements: Spouse/significant other Available Help at Discharge: Family;Available PRN/intermittently Type of Home:  House Home Access: Ramped entrance (+1 step at threshold)       Home Layout: One level Home Equipment: Agricultural consultant (2 wheels);Shower seat;Wheelchair - manual Additional Comments: sleeps in a lift chair; uses wc  for community ambulation and RW for household ambulation    Prior Function Prior Level of Function : Working/employed;Needs assist;History of Falls (last six months) (works from home)               ADLs Comments: pt reports needing assistance w/ ADLs     Extremity/Trunk Assessment   Upper Extremity Assessment Upper Extremity Assessment: Generalized weakness;Overall [facility] for tasks assessed    Lower Extremity Assessment Lower Extremity Assessment: Generalized weakness;Overall WFL for tasks assessed       Communication   Communication Communication: No apparent difficulties    Cognition Arousal: Alert Behavior During Therapy: WFL for tasks assessed/performed                             Following commands: Intact       Cueing Cueing Techniques: Verbal cues, Gestural cues     General Comments      Exercises Other Exercises Other Exercises: Pt and spouse educated re: PT role/POC, DC recommendations, safety with functional mobility, activity pacing/energy conservation with DME, OOB to chair for meals, OOB for toileting, call for help. They verbalized understanding.   Assessment/Plan    PT Assessment Patient needs continued PT services  PT Problem List Decreased strength;Decreased activity tolerance;Decreased balance;Decreased mobility;Pain       PT Treatment Interventions DME instruction;Gait training;Stair training;Functional mobility training;Therapeutic activities;Therapeutic exercise;Balance training;Neuromuscular re-education    PT Goals (Current goals can be found in the Care Plan section)  Acute Rehab PT Goals Patient Stated Goal: go home PT Goal Formulation: With patient/family Time For Goal Achievement: 03/17/24 Potential to Achieve Goals: Fair    Frequency Min 3X/week        AM-PAC PT 6 Clicks Mobility  Outcome Measure Help needed turning from your back to your side while in a flat bed without using bedrails?: A Little Help  needed moving from lying on your back to sitting on the side of a flat bed without using bedrails?: A Little Help needed moving to and from a bed to a chair (including a wheelchair)?: A Little Help needed standing up from a chair using your arms (e.g., wheelchair or bedside chair)?: A Little Help needed to walk in hospital room?: A Little Help needed climbing 3-5 steps with a railing? : A Little 6 Click Score: 18    End of Session Equipment Utilized During Treatment: Gait belt Activity Tolerance: Patient tolerated treatment well Patient left: in chair;with call bell/phone within reach;with bed alarm set;with nursing/sitter in room;with family/visitor present Nurse Communication: Mobility status PT Visit Diagnosis: Unsteadiness on feet (R26.81);Muscle weakness (generalized) (M62.81);Difficulty in walking, not elsewhere classified (R26.2);History of falling (Z91.81)    Time: 8842-8788 PT Time Calculation (min) (ACUTE ONLY): 14 min   Charges:   PT Evaluation $PT Eval Moderate Complexity: 1 Mod   PT General Charges $$ ACUTE PT VISIT: 1 Visit       Camie CHARLENA Kluver, PT, DPT 1:41 PM,03/03/24 Physical Therapist - [facility]

## 2024-03-03 NOTE — Assessment & Plan Note (Signed)
 Likely due to dehydration and poor p.o. intake, worsening creatinine now at 1.50, also has increasing weight.  Baseline seems to be less than 1. Ordered 1 dose of IV Lasix  -Monitor renal function -Avoid nephrotoxins

## 2024-03-03 NOTE — Assessment & Plan Note (Signed)
 Recovered systolic function Repeat echo with normal EF and grade 1 diastolic dysfunction. EF of 40% according to echo done in December 2020. Clinically appears dry. - Monitor volume status closely while she is being getting IV fluid.

## 2024-03-03 NOTE — Progress Notes (Signed)
  Echocardiogram 2D Echocardiogram has been performed. Definity  IV ultrasound imaging agent used on this study.  [name] 03/03/2024, 5:08 PM

## 2024-03-03 NOTE — Assessment & Plan Note (Signed)
 History of SIADH with lung mets -Sodium improved. -Continue to monitor

## 2024-03-04 DIAGNOSIS — E876 Hypokalemia: Secondary | ICD-10-CM | POA: Diagnosis not present

## 2024-03-04 DIAGNOSIS — R112 Nausea with vomiting, unspecified: Secondary | ICD-10-CM | POA: Diagnosis not present

## 2024-03-04 DIAGNOSIS — K3189 Other diseases of stomach and duodenum: Secondary | ICD-10-CM | POA: Diagnosis not present

## 2024-03-04 DIAGNOSIS — C541 Malignant neoplasm of endometrium: Secondary | ICD-10-CM | POA: Diagnosis not present

## 2024-03-04 LAB — BASIC METABOLIC PANEL WITH GFR
Anion gap: 11 (ref 5–15)
BUN: 66 mg/dL — ABNORMAL HIGH (ref 6–20)
CO2: 30 mmol/L (ref 22–32)
Calcium: 9.2 mg/dL (ref 8.9–10.3)
Chloride: 92 mmol/L — ABNORMAL LOW (ref 98–111)
Creatinine, Ser: 1.25 mg/dL — ABNORMAL HIGH (ref 0.44–1.00)
GFR, Estimated: 50 mL/min — ABNORMAL LOW (ref >60)
Glucose, Bld: 212 mg/dL — ABNORMAL HIGH (ref 70–99)
Potassium: 3.7 mmol/L (ref 3.5–5.1)
Sodium: 133 mmol/L — ABNORMAL LOW (ref 135–145)

## 2024-03-04 LAB — GLUCOSE, CAPILLARY
Glucose-Capillary: 197 mg/dL — ABNORMAL HIGH (ref 70–99)
Glucose-Capillary: 205 mg/dL — ABNORMAL HIGH (ref 70–99)
Glucose-Capillary: 241 mg/dL — ABNORMAL HIGH (ref 70–99)
Glucose-Capillary: 288 mg/dL — ABNORMAL HIGH (ref 70–99)
Glucose-Capillary: 458 mg/dL — ABNORMAL HIGH (ref 70–99)

## 2024-03-04 LAB — ECHOCARDIOGRAM COMPLETE
AR max vel: 1.77 cm2
AV Peak grad: 9.9 mmHg
Ao pk vel: 1.57 m/s
Area-P 1/2: 5.13 cm2
Calc EF: 63.1 %
Height: 64 in
S' Lateral: 3.2 cm
Single Plane A2C EF: 60.8 %
Single Plane A4C EF: 68.7 %
Weight: 2892.44 [oz_av]

## 2024-03-04 LAB — PHOSPHORUS: Phosphorus: 1.4 mg/dL — ABNORMAL LOW (ref 2.5–4.6)

## 2024-03-04 LAB — MAGNESIUM: Magnesium: 2.6 mg/dL — ABNORMAL HIGH (ref 1.7–2.4)

## 2024-03-04 MED ORDER — INSULIN ASPART 100 UNIT/ML IJ SOLN
0.0000 [IU] | INTRAMUSCULAR | Status: DC
  Administered 2024-03-04 (×2): 7 [IU] via SUBCUTANEOUS
  Administered 2024-03-04: 4 [IU] via SUBCUTANEOUS
  Administered 2024-03-04: 11 [IU] via SUBCUTANEOUS
  Administered 2024-03-05 (×2): 4 [IU] via SUBCUTANEOUS
  Administered 2024-03-05: 7 [IU] via SUBCUTANEOUS
  Administered 2024-03-05: 4 [IU] via SUBCUTANEOUS
  Administered 2024-03-05: 7 [IU] via SUBCUTANEOUS
  Administered 2024-03-06: 4 [IU] via SUBCUTANEOUS
  Administered 2024-03-06: 3 [IU] via SUBCUTANEOUS
  Administered 2024-03-06 (×2): 4 [IU] via SUBCUTANEOUS
  Administered 2024-03-06 (×2): 3 [IU] via SUBCUTANEOUS
  Administered 2024-03-06 – 2024-03-07 (×4): 4 [IU] via SUBCUTANEOUS
  Administered 2024-03-07: 7 [IU] via SUBCUTANEOUS
  Administered 2024-03-07: 4 [IU] via SUBCUTANEOUS
  Administered 2024-03-08 (×2): 3 [IU] via SUBCUTANEOUS
  Filled 2024-03-04 (×23): qty 1

## 2024-03-04 MED ORDER — POTASSIUM PHOSPHATES 15 MMOLE/5ML IV SOLN
30.0000 mmol | Freq: Once | INTRAVENOUS | Status: AC
  Administered 2024-03-04: 30 mmol via INTRAVENOUS
  Filled 2024-03-04: qty 10

## 2024-03-04 MED ORDER — TRACE MINERALS CU-MN-SE-ZN 300-55-60-3000 MCG/ML IV SOLN
INTRAVENOUS | Status: AC
  Filled 2024-03-04: qty 738.4

## 2024-03-04 MED ORDER — SODIUM CHLORIDE 0.9 % IV SOLN: INTRAVENOUS | Status: AC

## 2024-03-04 NOTE — Anesthesia Postprocedure Evaluation (Signed)
 Anesthesia Post Note  Patient: Tonya [name]  Procedure(s) Performed: EGD (ESOPHAGOGASTRODUODENOSCOPY)  Patient location during evaluation: Endoscopy Anesthesia Type: General Level of consciousness: awake and alert Pain management: pain level controlled Vital Signs Assessment: post-procedure vital signs reviewed and stable Respiratory status: spontaneous breathing, nonlabored ventilation, respiratory function stable and patient connected to nasal cannula oxygen Cardiovascular status: blood pressure returned to baseline and stable Postop Assessment: no apparent nausea or vomiting Anesthetic complications: no   No notable events documented.   Last Vitals:  Vitals:   03/03/24 2036 03/04/24 0329  BP: (!) 147/85 (!) 158/91  Pulse: 88 97  Resp: 20 20  Temp: 36.5 C 36.6 C  SpO2: 96% 98%    Last Pain:  Vitals:   03/04/24 0329  TempSrc: Oral  PainSc:                  Debby M[place]

## 2024-03-04 NOTE — Progress Notes (Signed)
 Progress Note   Patient: Tonya [name] FMW:[id number] DOB: [birth year] DOA: 02/29/2024     3 DOS: the patient was seen and examined on 03/04/2024   Brief hospital course: Taken from prior notes.   Tonya [name] is a 58 y.o. 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  Patient had this intermittent symptoms starting early May of this year which she initially contributed to chemotherapy.  Due to severity of symptoms and unable to tolerate any p.o. she presented to ED.    CT abdomen pelvis showed interval marked improvement with distention of the stomach with irregular wall thickening of antropyloric region with mucosal enhancement with otherwise decompressed appearance of the bowel outlet obstruction or functional obstruction of stomach cannot be excluded. Blood work showed sodium 130, potassium 2.6 chloride 83 glucose 162 BUN 80 creatinine 1.3.   NG tube was inserted and GI was consulted, patient underwent EGD which shows a bleeding obstructing mass in the gastric body, also seen in duodenal bulb and pylorus.  Biopsies were taken.  Oncology was consulted.  Initial recommendations were for palliative care but patient would like to try second agent.  Patient will likely need a venting J-tube which will be challenging due to extensive mass.  7/11: Vital stable, persistent hypokalemia at 3 which is being repleted.  Persistent leukocytosis and hemoglobin of 10.  Small improvement in creatinine to 1.37.  PICC line and TPN consult was ordered as there is no other option based on her goals of care.  Oncology is going to try a second line agent. IR will try placing a venting tube and paracentesis on Monday.  Patient has very poor prognosis.  Does not want to go on hospice until exhausted all resources, stating that she has to take care of some  family matters before making that decision.  7/12: Hemodynamically stable, started on TPN.  Going for reventing PEG tube placement and paracentesis with IR on Monday.  Echocardiogram was ordered to reassess her prior HFrEF.  Patient did not follow-up with cardiology after PCI.  7/13: Hemodynamically stable, renal function and electrolytes stable, mild pseudohyponatremia secondary to hyperglycemia.  5 units of insulin  to be added for 2 TPN bag. Venting PEG tube likely be placed tomorrow by IR Repeat echo with recovered and normal EF and grade 1 diastolic dysfunction.  Assessment and Plan: * Intractable nausea and vomiting Secondary to gastric outlet obstruction with mass involving gastric body, pylorus and initial duodenum on EGD.  Likely disease progression with concern of neuroendocrine features. NG tube is in place. Patient need a venting PEG tube, not a surgical candidate, IR was consulted by oncology and they will likely attempt on Monday. - Will remain n.p.o. except some sips of water  - PICC line was placed and starting on TPN -Continue with Reglan  and octreotide  - Continue with supportive care   Endometrial carcinoma (HCC) History of stage IV endometrial carcinoma with disease progression and neuroendocrine features. Oncology was suggesting hospice but patient would like to try second line agent and does not want to give up at this time. Very guarded prognosis. - Oncology is working on starting on topotecan likely from 03/13/2024- approval is in progress.   AKI (acute kidney injury) (HCC) Likely due to dehydration and poor p.o. intake, creatinine remained at 1.25.  Baseline seems to be less than 1. Giving some more gentle IV fluid- -Monitor renal function -Avoid nephrotoxins  Chronic hyponatremia History of SIADH with lung mets -Sodium improved to 134 today -Continue to monitor  Hypokalemia Significant hypokalemia on admission with normal magnesium .  Potassium improved to  3.7 today - Continue to monitor and replete as needed  Chronic HFrEF (heart failure with reduced ejection fraction) (HCC) Recovered systolic function Repeat echo with normal EF and grade 1 diastolic dysfunction. EF of 40% according to echo done in December 2020. Clinically appears dry. - Monitor volume status closely while she is being getting IV fluid.  Leukocytosis Likely reactive, UA was not consistent with UTI. No other obvious infection -Continue to monitor -Keep holding antibiotics  Normocytic anemia Hemoglobin of 10, likely due to ongoing chemotherapy and anemia of chronic disease. - Monitor hemoglobin -Transfuse if below 7  Hypothyroidism - Continue home Synthroid -we will switch to IV   Subjective: Patient was sitting comfortably in chair when seen today.  No new concern.  Wants to go home.  Physical Exam: Vitals:   03/03/24 2036 03/04/24 0329 03/04/24 0436 03/04/24 0835  BP: (!) 147/85 (!) 158/91  (!) 141/92  Pulse: 88 97  99  Resp: 20 20  20   Temp: 97.7 F (36.5 C) 97.8 F (36.6 C)  97.6 F (36.4 C)  TempSrc: Oral Oral  Oral  SpO2: 96% 98%  97%  Weight:   83.1 kg   Height:       General.  Well-developed lady, in no acute distress.  NG tube in place Pulmonary.  Lungs clear bilaterally, normal respiratory effort. CV.  Regular rate and rhythm, no JVD, rub or murmur. Abdomen.  Soft, nontender, nondistended, BS positive. CNS.  Alert and oriented .  No focal neurologic deficit. Extremities.  No edema,  pulses intact and symmetrical. Psychiatry.  Judgment and insight appears normal.    Data Reviewed: Prior data reviewed  Family Communication: Discussed with patient  Disposition: Status is: Inpatient Remains inpatient appropriate because: Severity of illness  Planned Discharge Destination: Home with home health  DVT prophylaxis.  Lovenox  Time spent: 50 minutes  This record has been created using Conservation officer, historic buildings. Errors have been  sought and corrected,but may not always be located. Such creation errors do not reflect on the standard of care.   Author: Amaryllis Dare, MD 03/04/2024 3:51 PM  For on call review [email or web address].

## 2024-03-04 NOTE — Consult Note (Signed)
 PHARMACY - TOTAL PARENTERAL NUTRITION CONSULT NOTE   Indication: Small bowel obstruction  Patient Measurements: Height: 5' 4 (162.6 cm) Weight: 83.1 kg (183 lb 3.2 oz) IBW/kg (Calculated) : 54.7 TPN AdjBW (KG): 61.4 Body mass index is 31.45 kg/m. Usual Weight: N/A  Assessment:  58 y.o. 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    Glucose / Insulin : no history of DM Dexamethasone  10mg  IV daily ordered Insulin  requirements the last 24h: 16 units Electrolytes: Mild hypermagnesemia, Hypophosphatemia Renal: Scr 1.37>1.43>1.37-->1.25 Hepatic: AST/ALT WNL Intake / Output; MIVF: net(-1.7L), MIVF stopped GI Imaging: CT abdomen/pelvis 7/11:  1. Interval marked fluid distension of the stomach with irregular wall thickening of the antral pyloric region with mucosal enhancement with otherwise decompressed appearance of the bowel, outlet obstruction or functional obstruction of the stomach cannot be excluded. NG decompression is recommended 2. Slight increased ascites within the abdomen and pelvis, now small-moderate volume. No free air. 3. New mild left-sided hydronephrosis and hydroureter, with more normal appearing caliber of distal left ureter in the pelvis, anterior to the iliac vessels. No obstructing stone. Findings are consistent with distal ureteral obstruction. Decreased excretion of contrast from the left kidney on delayed views. 4. Nodular soft tissue thickening at the right posterior bladder, stable in the short interim but suspicious for neoplasm/metastatic disease. 5. Extensive abdominal, retroperitoneal and pelvic adenopathy consistent with metastatic disease. 6. 6 mm right lower lobe pulmonary nodule, stable, and also concerning for metastatic disease. 7. Aortic atherosclerosis.  GI Surgeries / Procedures:  EGD  done 7/10:   Central access: 7/11 TPN start date: 7/11  Nutritional Goals: Goal TPN rate is 65 mL/hr (provides 111 g of protein and 2049 kcals per day)  RD Assessment: Estimated Needs Total Energy Estimated Needs: 2000-2300kcal/day Total Protein Estimated Needs: 100-115g/day Total Fluid Estimated Needs: 1.4-1.6L/dayTo be entered shortly  Current Nutrition:  NPO  Plan:  Increase TPN to goal rate of 85mL/hr at 1800 Electrolytes in TPN: Na 77mEq/L, K 24mEq/L, Ca 46mEq/L, Mg 40mEq/L(removed on 7/13), and Phos 71mmol/L(increased on 7/13). Cl:Ac 1:1 Add standard MVI and trace elements to TPN Add thiamine as well to TPN for 7 days(day 3) Will order Potassium Phosphate  30mmol IV x 1 outside of TPN to help replete Increase to Resistant q6h SSI and adding 10 units of regular insulin  to TPN bag while patient is on IV steroids Monitor TPN labs on Mon/Thurs, daily until stable  Tonya [name] A Tonya [name] 03/04/2024,10:38 AM

## 2024-03-04 NOTE — Progress Notes (Signed)
 Physical Therapy Treatment Patient Details Name: Tonya [name] MRN: [id number] DOB: 11/20/1965 Today's Date: 03/04/2024   History of Present Illness Pt is a 58 y.o. female who presents with persistent nausea and vomiting over the last 2 weeks. She underwent EGD 03/01/24 which revealed a large gastric mass oozing blood and a separate pyloric mass going into the duodenum and was obstructing. PMH: CAD, NSTEMI, hypertension, type 2 diabetes, hypothyroidism, and metastatic uterine cancer    PT Comments  Motivated for session.  She is able to get to EOB with min a x 1  steady once sitting.  She is able to stand to RW with cga/min a x 1 and complete multiple transfers during session x 3 and static standing/pre-gait activities.  To BSC to void.  She does report general fatigue with mobility and activity tolerance is quite limited to short periods before needing to rest.  Dizziness with transitions but it does clear and it does not limit session.  Motivated to increase strength and mobility, remained in recliner after session with needs met.   If plan is discharge home, recommend the following: A little help with walking and/or transfers;A little help with bathing/dressing/bathroom;Assistance with cooking/housework;Assist for transportation;Help with stairs or ramp for entrance   Can travel by private vehicle        Equipment Recommendations  BSC/3in1    Recommendations for Other Services       Precautions / Restrictions Precautions Precautions: Fall Recall of Precautions/Restrictions: Intact Restrictions Weight Bearing Restrictions Per Provider Order: No Other Position/Activity Restrictions: NG tube, TPN feeds     Mobility  Bed Mobility Overal bed mobility: Needs Assistance Bed Mobility: Supine to Sit     Supine to sit: Min assist, HOB elevated       Patient Response: Cooperative  Transfers Overall transfer level: Needs assistance Equipment used: Rolling walker (2  wheels) Transfers: Sit to/from Stand Sit to Stand: Contact guard assist, Min assist                Ambulation/Gait Ambulation/Gait assistance: Contact guard assist Gait Distance (Feet): 2 Feet Assistive device: Rolling walker (2 wheels) Gait Pattern/deviations: Step-to pattern Gait velocity: dec     General Gait Details: good steps to transfer but limited by general fatigue with activity.   Stairs             Wheelchair Mobility     Tilt Bed Tilt Bed Patient Response: Cooperative  Modified Rankin (Stroke Patients Only)       Balance Overall balance assessment: Needs assistance Sitting-balance support: Feet supported Sitting balance-Leahy Scale: Fair     Standing balance support: Bilateral upper extremity supported Standing balance-Leahy Scale: Fair Standing balance comment: standing balance with RW                            Communication Communication Communication: No apparent difficulties  Cognition Arousal: Alert Behavior During Therapy: WFL for tasks assessed/performed   PT - Cognitive impairments: No apparent impairments                         Following commands: Intact      Cueing Cueing Techniques: Verbal cues, Gestural cues  Exercises Other Exercises Other Exercises: to [facility] to void, standing activities/pre-gait with RW support    General Comments        Pertinent Vitals/Pain Pain Assessment Pain Assessment: No/denies pain Pain Intervention(s): Monitored during session, Repositioned  Home Living                          Prior Function            PT Goals (current goals can now be found in the care plan section) Progress towards PT goals: Progressing toward goals    Frequency    Min 3X/week      PT Plan      Co-evaluation              AM-PAC PT 6 Clicks Mobility   Outcome Measure  Help needed turning from your back to your side while in a flat bed without using  bedrails?: A Little Help needed moving from lying on your back to sitting on the side of a flat bed without using bedrails?: A Little Help needed moving to and from a bed to a chair (including a wheelchair)?: A Little Help needed standing up from a chair using your arms (e.g., wheelchair or bedside chair)?: A Little Help needed to walk in hospital room?: A Little Help needed climbing 3-5 steps with a railing? : A Little 6 Click Score: 18    End of Session Equipment Utilized During Treatment: Gait belt Activity Tolerance: Patient tolerated treatment well Patient left: in chair;with call bell/phone within reach;with bed alarm set;with nursing/sitter in room;with family/visitor present Nurse Communication: Mobility status PT Visit Diagnosis: Unsteadiness on feet (R26.81);Muscle weakness (generalized) (M62.81);Difficulty in walking, not elsewhere classified (R26.2);History of falling (Z91.81)     Time: 8962-8889 PT Time Calculation (min) (ACUTE ONLY): 33 min  Charges:    $Therapeutic Exercise: 8-22 mins $Therapeutic Activity: 8-22 mins PT General Charges $$ ACUTE PT VISIT: 1 Visit                   Lauraine Gills, PTA 03/04/24, 11:24 AM

## 2024-03-05 ENCOUNTER — Inpatient Hospital Stay: Admitting: Radiology

## 2024-03-05 ENCOUNTER — Encounter: Payer: Self-pay | Admitting: Gastroenterology

## 2024-03-05 DIAGNOSIS — R112 Nausea with vomiting, unspecified: Secondary | ICD-10-CM | POA: Diagnosis not present

## 2024-03-05 DIAGNOSIS — K3189 Other diseases of stomach and duodenum: Secondary | ICD-10-CM | POA: Diagnosis not present

## 2024-03-05 DIAGNOSIS — C541 Malignant neoplasm of endometrium: Secondary | ICD-10-CM | POA: Diagnosis not present

## 2024-03-05 DIAGNOSIS — E876 Hypokalemia: Secondary | ICD-10-CM | POA: Diagnosis not present

## 2024-03-05 HISTORY — PX: IR GASTROSTOMY TUBE MOD SED: IMG625

## 2024-03-05 HISTORY — PX: IR PARACENTESIS: IMG2679

## 2024-03-05 LAB — COMPREHENSIVE METABOLIC PANEL WITH GFR
ALT: 8 U/L (ref 0–44)
AST: 31 U/L (ref 15–41)
Albumin: 2.5 g/dL — ABNORMAL LOW (ref 3.5–5.0)
Alkaline Phosphatase: 56 U/L (ref 38–126)
Anion gap: 11 (ref 5–15)
BUN: 75 mg/dL — ABNORMAL HIGH (ref 6–20)
CO2: 27 mmol/L (ref 22–32)
Calcium: 9.1 mg/dL (ref 8.9–10.3)
Chloride: 98 mmol/L (ref 98–111)
Creatinine, Ser: 1.22 mg/dL — ABNORMAL HIGH (ref 0.44–1.00)
GFR, Estimated: 51 mL/min — ABNORMAL LOW (ref >60)
Glucose, Bld: 168 mg/dL — ABNORMAL HIGH (ref 70–99)
Potassium: 4.2 mmol/L (ref 3.5–5.1)
Sodium: 136 mmol/L (ref 135–145)
Total Bilirubin: 0.4 mg/dL (ref 0.0–1.2)
Total Protein: 5.8 g/dL — ABNORMAL LOW (ref 6.5–8.1)

## 2024-03-05 LAB — GLUCOSE, CAPILLARY
Glucose-Capillary: 177 mg/dL — ABNORMAL HIGH (ref 70–99)
Glucose-Capillary: 185 mg/dL — ABNORMAL HIGH (ref 70–99)
Glucose-Capillary: 200 mg/dL — ABNORMAL HIGH (ref 70–99)
Glucose-Capillary: 226 mg/dL — ABNORMAL HIGH (ref 70–99)
Glucose-Capillary: 225 mg/dL — ABNORMAL HIGH (ref 70–99)
Glucose-Capillary: 159 mg/dL — ABNORMAL HIGH (ref 70–99)

## 2024-03-05 LAB — CBC
HCT: 33 % — ABNORMAL LOW (ref 36.0–46.0)
Hemoglobin: 11 g/dL — ABNORMAL LOW (ref 12.0–15.0)
MCH: 32.8 pg (ref 26.0–34.0)
MCHC: 33.3 g/dL (ref 30.0–36.0)
MCV: 98.5 fL (ref 80.0–100.0)
Platelets: 268 K/uL (ref 150–400)
RBC: 3.35 MIL/uL — ABNORMAL LOW (ref 3.87–5.11)
RDW: 17 % — ABNORMAL HIGH (ref 11.5–15.5)
WBC: 22.9 K/uL — ABNORMAL HIGH (ref 4.0–10.5)
nRBC: 0.1 % (ref 0.0–0.2)

## 2024-03-05 LAB — PHOSPHORUS: Phosphorus: 2.6 mg/dL (ref 2.5–4.6)

## 2024-03-05 LAB — TRIGLYCERIDES: Triglycerides: 356 mg/dL — ABNORMAL HIGH (ref <150)

## 2024-03-05 LAB — MAGNESIUM: Magnesium: 2.2 mg/dL (ref 1.7–2.4)

## 2024-03-05 LAB — PROTIME-INR
INR: 1 (ref 0.8–1.2)
Prothrombin Time: 13.2 s (ref 11.4–15.2)

## 2024-03-05 LAB — SURGICAL PATHOLOGY

## 2024-03-05 MED ORDER — GLUCAGON HCL RDNA (DIAGNOSTIC) 1 MG IJ SOLR
INTRAMUSCULAR | Status: AC | PRN
  Administered 2024-03-05: .5 mg via INTRAVENOUS

## 2024-03-05 MED ORDER — IOHEXOL 300 MG/ML  SOLN
20.0000 mL | Freq: Once | INTRAMUSCULAR | Status: AC | PRN
  Administered 2024-03-05: 20 mL

## 2024-03-05 MED ORDER — MIDAZOLAM HCL 5 MG/5ML IJ SOLN
INTRAMUSCULAR | Status: AC | PRN
  Administered 2024-03-05 (×2): 1 mg via INTRAVENOUS

## 2024-03-05 MED ORDER — LIDOCAINE HCL (PF) 1 % IJ SOLN
15.0000 mL | Freq: Once | INTRAMUSCULAR | Status: AC
  Administered 2024-03-05: 15 mL
  Filled 2024-03-05: qty 15

## 2024-03-05 MED ORDER — CEFAZOLIN SODIUM-DEXTROSE 1-4 GM/50ML-% IV SOLN
INTRAVENOUS | Status: AC | PRN
  Administered 2024-03-05: 2 g via INTRAVENOUS

## 2024-03-05 MED ORDER — LIDOCAINE HCL (PF) 1 % IJ SOLN
5.0000 mL | Freq: Once | INTRAMUSCULAR | Status: AC
  Administered 2024-03-05: 5 mL via INTRADERMAL
  Filled 2024-03-05: qty 5

## 2024-03-05 MED ORDER — FENTANYL CITRATE (PF) 100 MCG/2ML IJ SOLN
INTRAMUSCULAR | Status: AC | PRN
  Administered 2024-03-05 (×2): 50 ug via INTRAVENOUS

## 2024-03-05 MED ORDER — ALBUMIN HUMAN 25 % IV SOLN
25.0000 g | Freq: Once | INTRAVENOUS | Status: AC
  Administered 2024-03-05: 25 g via INTRAVENOUS
  Filled 2024-03-05: qty 100

## 2024-03-05 MED ORDER — HYDROMORPHONE HCL 1 MG/ML IJ SOLN
1.0000 mg | INTRAMUSCULAR | Status: DC | PRN
  Administered 2024-03-05 – 2024-03-07 (×6): 1 mg via INTRAVENOUS
  Filled 2024-03-05 (×8): qty 1

## 2024-03-05 MED ORDER — GLUCAGON HCL RDNA (DIAGNOSTIC) 1 MG IJ SOLR
INTRAMUSCULAR | Status: AC
  Filled 2024-03-05: qty 1

## 2024-03-05 MED ORDER — FENTANYL CITRATE (PF) 100 MCG/2ML IJ SOLN
INTRAMUSCULAR | Status: AC
  Filled 2024-03-05: qty 2

## 2024-03-05 MED ORDER — TRACE MINERALS CU-MN-SE-ZN 300-55-60-3000 MCG/ML IV SOLN
INTRAVENOUS | Status: AC
  Filled 2024-03-05: qty 738.4

## 2024-03-05 MED ORDER — CEFAZOLIN SODIUM-DEXTROSE 2-4 GM/100ML-% IV SOLN
INTRAVENOUS | Status: AC
  Filled 2024-03-05: qty 100

## 2024-03-05 MED ORDER — MIDAZOLAM HCL 2 MG/2ML IJ SOLN
INTRAMUSCULAR | Status: AC
  Filled 2024-03-05: qty 2

## 2024-03-05 MED ORDER — LIDOCAINE HCL (PF) 1 % IJ SOLN
INTRAMUSCULAR | Status: AC
  Filled 2024-03-05: qty 30

## 2024-03-05 NOTE — Assessment & Plan Note (Signed)
 Likely reactive, UA was not consistent with UTI.  No other obvious sign of infection, worsening leukocytosis, patient was also on Decadron  which is now being tapered. -Continue to monitor -Keep holding antibiotics

## 2024-03-05 NOTE — Progress Notes (Signed)
 Patient clinically stable post IR PEG tube placement along with paracentesis per Dr Hughes, tolerated well. 3.6 liters pinkish yellow drainage with paracentesis, vitals stable pre and post procedure. Denies complaints immediately post procedure. Received Versed  2 mg along with Fentanyl  100 mcg IV for procedure. Report given to Jequetta Thomas RN post procedure/specials/21

## 2024-03-05 NOTE — Progress Notes (Signed)
 Progress Note   Patient: Tonya [name] FMW:[id number] DOB: April 24, 1966 DOA: 02/29/2024     4 DOS: the patient was seen and examined on 03/05/2024   Brief hospital course: Taken from prior notes.   Tonya [name] is a 58 y.o. 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  Patient had this intermittent symptoms starting early May of this year which she initially contributed to chemotherapy.  Due to severity of symptoms and unable to tolerate any p.o. she presented to ED.    CT abdomen pelvis showed interval marked improvement with distention of the stomach with irregular wall thickening of antropyloric region with mucosal enhancement with otherwise decompressed appearance of the bowel outlet obstruction or functional obstruction of stomach cannot be excluded. Blood work showed sodium 130, potassium 2.6 chloride 83 glucose 162 BUN 80 creatinine 1.3.   NG tube was inserted and GI was consulted, patient underwent EGD which shows a bleeding obstructing mass in the gastric body, also seen in duodenal bulb and pylorus.  Biopsies were taken.  Oncology was consulted.  Initial recommendations were for palliative care but patient would like to try second agent.  Patient will likely need a venting J-tube which will be challenging due to extensive mass.  7/11: Vital stable, persistent hypokalemia at 3 which is being repleted.  Persistent leukocytosis and hemoglobin of 10.  Small improvement in creatinine to 1.37.  PICC line and TPN consult was ordered as there is no other option based on her goals of care.  Oncology is going to try a second line agent. IR will try placing a venting tube and paracentesis on Monday.  Patient has very poor prognosis.  Does not want to go on hospice until exhausted all resources, stating that she has to take care of some  family matters before making that decision.  7/12: Hemodynamically stable, started on TPN.  Going for reventing PEG tube placement and paracentesis with IR on Monday.  Echocardiogram was ordered to reassess her prior HFrEF.  Patient did not follow-up with cardiology after PCI.  7/13: Hemodynamically stable, renal function and electrolytes stable, mild pseudohyponatremia secondary to hyperglycemia.  5 units of insulin  to be added for 2 TPN bag. Venting PEG tube likely be placed tomorrow by IR Repeat echo with recovered and normal EF and grade 1 diastolic dysfunction.  Assessment and Plan: * Intractable nausea and vomiting Secondary to gastric outlet obstruction with mass involving gastric body, pylorus and initial duodenum on EGD.  Likely disease progression with concern of neuroendocrine features. S/p venting PEG tube placement and paracentesis with IR today - Will remain n.p.o. except some sips of water  - PICC line was placed and starting on TPN -Continue with Reglan  and octreotide  - Continue with supportive care   Endometrial carcinoma (HCC) History of stage IV endometrial carcinoma with disease progression and neuroendocrine features. Oncology was suggesting hospice but patient would like to try second line agent and does not want to give up at this time. Very guarded prognosis. - Oncology is working on starting on topotecan likely from 03/13/2024- approval is in progress.   AKI (acute kidney injury) (HCC) Likely due to dehydration and poor p.o. intake, creatinine remained stable at 1.22 today.  Baseline seems to be less than 1. Giving some more gentle IV fluid- -Monitor renal function -Avoid nephrotoxins  Chronic hyponatremia History of SIADH with lung mets -Sodium improved to 134 today -Continue  to monitor  Hypokalemia Significant hypokalemia on admission with normal magnesium .  Has been improved - Continue to monitor and replete as needed  Chronic HFrEF (heart failure  with reduced ejection fraction) (HCC) Recovered systolic function Repeat echo with normal EF and grade 1 diastolic dysfunction. EF of 40% according to echo done in December 2020. Clinically appears dry. - Monitor volume status closely while she is being getting IV fluid.  Leukocytosis Likely reactive, UA was not consistent with UTI.  No other obvious sign of infection, slight worsening leukocytosis today -Continue to monitor -Keep holding antibiotics  Normocytic anemia Hemoglobin of 11 today, likely due to ongoing chemotherapy and anemia of chronic disease. - Monitor hemoglobin -Transfuse if below 7  Hypothyroidism - Continue home Synthroid -we will switch to IV  Protein-calorie malnutrition, severe Dietitian consult   Subjective: Patient was about to go for her procedure when seen today.  No new concern, she was asking about discharge  Physical Exam: Vitals:   03/05/24 1245 03/05/24 1259 03/05/24 1300 03/05/24 1344  BP: 129/74 (!) 143/79 (!) 143/79 130/73  Pulse: 93 95 92 91  Resp: 15 20 16 16   Temp:   97.8 F (36.6 C) 98 F (36.7 C)  TempSrc:   Oral Oral  SpO2: 94% 96% 96% 99%  Weight:      Height:       General.  Frail lady, in no acute distress. Pulmonary.  Lungs clear bilaterally, normal respiratory effort. CV.  Regular rate and rhythm, no JVD, rub or murmur. Abdomen.  Soft, nontender, nondistended, BS positive. CNS.  Alert and oriented .  No focal neurologic deficit. Extremities.  No edema, no cyanosis, pulses intact and symmetrical. Psychiatry.  Judgment and insight appears normal.    Data Reviewed: Prior data reviewed  Family Communication: Discussed with patient  Disposition: Status is: Inpatient Remains inpatient appropriate because: Severity of illness  Planned Discharge Destination: Home with home health  DVT prophylaxis.  Lovenox  Time spent: 50 minutes  This record has been created using Conservation officer, historic buildings. Errors have been  sought and corrected,but may not always be located. Such creation errors do not reflect on the standard of care.   Author: Amaryllis Dare, MD 03/05/2024 3:55 PM  For on call review [email or web address].

## 2024-03-05 NOTE — Assessment & Plan Note (Signed)
 Hemoglobin of 11.5 today, likely due to ongoing chemotherapy and anemia of chronic disease. There was some concern of bleeding after the PEG tube placement but hemoglobin seems stable - Monitor hemoglobin -Transfuse if below 7

## 2024-03-05 NOTE — Assessment & Plan Note (Signed)
-  Dietitian consult

## 2024-03-05 NOTE — Progress Notes (Signed)
 PT Cancellation Note  Patient Details Name: Tonya [name] MRN: [id number] DOB: Dec 11, 1965   Cancelled Treatment:    Reason Eval/Treat Not Completed: Patient at procedure or test/unavailable  Pt offered session.  She is awaiting time for PEG tube and voices general concern over procedure and What-if's if it is not able to be done.  Encouragement given.  Declined session at this time.  Will return tomorrow per request.   Lauraine Gills 03/05/2024, 9:07 AM

## 2024-03-05 NOTE — TOC Progression Note (Signed)
 Transition of Care [facility]) - Progression Note    Patient Details  Name: Tonya [name] MRN: [id number] Date of Birth: [birth year]  [facility] CM/SW Contact  Corean ONEIDA Haddock, RN Phone Number: 03/05/2024, 3:20 PM  Clinical Narrative:      Per TOC note from this weekend patient request [facility] home health at discharge  [facility] with [facility] accepted for therapy.  Per Darleene with [facility] if patient requires TPN at discharge [facility] RN would have to manage the TPN. Pam with [facility] is aware  Patient s/p IR PEG tube placement along with paracentesis   Patient will require BSC closer to discharge        Expected Discharge Plan and Services                                               Social Determinants of Health (SDOH) Interventions SDOH Screenings   Food Insecurity: No Food Insecurity (03/01/2024)  Housing: Low Risk  (03/01/2024)  Transportation Needs: No Transportation Needs (03/01/2024)  Utilities: Not At Risk (03/01/2024)  Depression (PHQ2-9): Low Risk  (02/22/2024)  Financial Resource Strain: Low Risk  (07/23/2019)  Physical Activity: Unknown (07/23/2019)  Social Connections: Moderately Integrated (10/11/2023)  Stress: No Stress Concern Present (07/23/2019)  Tobacco Use: Low Risk  (03/05/2024)  Health Literacy: Adequate Health Literacy (09/29/2023)    Readmission Risk Interventions    03/03/2024    2:15 PM 10/05/2023    2:26 PM  Readmission Risk Prevention Plan  Transportation Screening Complete Complete  HRI or Home Care Consult  Complete  Palliative Care Screening  Complete  Medication Review (RN Care Manager) Complete Complete  PCP or Specialist appointment within 3-5 days of discharge Complete   HRI or Home Care Consult Complete   Palliative Care Screening Not Applicable   Skilled Nursing Facility Not Applicable

## 2024-03-05 NOTE — TOC Initial Note (Signed)
 Transition of Care [facility]) - Initial/Assessment Note    Patient Details  Name: Tonya [name] MRN: [id number] Date of Birth: [birth year]  Transition of Care [facility]) CM/SW Contact:    Lauraine JAYSON Carpen, LCSW Phone Number: 03/05/2024, 3:32 PM  Clinical Narrative:  LATE ENTRY:   7/11: Received notification from team that patient would likely discharge on home TPN. CSW contact [facility] liaison to notify. She confirmed that their nursing team could provide RN services for patient.                Expected Discharge Plan: Home w Home Health Services Barriers to Discharge: Continued Medical Work up   Patient Goals and CMS Choice            Expected Discharge Plan and Services     Post Acute Care Choice: Home Health Living arrangements for the past 2 months: Single Family Home                           HH Arranged: TPN HH Agency: [facility] Date HH Agency Contacted: 03/02/24   Representative spoke with at [facility] Agency: Pam  Prior Living Arrangements/Services Living arrangements for the past 2 months: Single Family Home   Patient language and need for interpreter reviewed:: Yes              Criminal Activity/Legal Involvement Pertinent to Current Situation/Hospitalization: No - Comment as needed  Activities of Daily Living   ADL Screening (condition at time of admission) Independently performs ADLs?: No Does the patient have a NEW difficulty with bathing/dressing/toileting/self-feeding that is expected to last >3 days?: Yes (Initiates electronic notice to provider for possible OT consult) Does the patient have a NEW difficulty with getting in/out of bed, walking, or climbing stairs that is expected to last >3 days?: Yes (Initiates electronic notice to provider for possible PT consult) Does the patient have a NEW difficulty with communication that is expected to last >3 days?: No Is the patient deaf or have difficulty hearing?: No Does the patient have difficulty seeing, even when  wearing glasses/contacts?: No Does the patient have difficulty concentrating, remembering, or making decisions?: No  Permission Sought/Granted                  Emotional Assessment       Orientation: : Oriented to Self, Oriented to Place, Oriented to  Time, Oriented to Situation Alcohol / Substance Use: Not Applicable Psych Involvement: No (comment)  Admission diagnosis:  Hypokalemia [E87.6] Nausea & vomiting [R11.2] Intractable nausea and vomiting [R11.2] Intractable nausea [R11.0] Patient Active Problem List   Diagnosis Date Noted   Protein-calorie malnutrition, severe 03/03/2024   Leukocytosis 03/02/2024   Hypokalemia 03/02/2024   Chronic HFrEF (heart failure with reduced ejection fraction) (HCC) 03/02/2024   Normocytic anemia 03/02/2024   Intractable nausea and vomiting 03/01/2024   Nausea & vomiting 03/01/2024   Gastric mass 03/01/2024   Goals of care, counseling/discussion 02/28/2024   Neutropenia, febrile (HCC) 11/04/2023   Actinomyces infection 10/12/2023   Closed compression fracture of body of L1 vertebra (HCC) 10/11/2023   Thrombocytopenia (HCC) 10/10/2023   Bacteremia due to Streptococcus 10/05/2023   Hypophosphatemia 10/04/2023   AKI (acute kidney injury) (HCC) 10/03/2023   Chronic hyponatremia 10/03/2023   Metabolic acidosis 10/03/2023   Neutropenia (HCC) 10/03/2023   C. 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) 09/21/2023   Endometrial carcinoma (HCC) 09/21/2023   Malignant neoplasm of uterus (HCC) 09/01/2023   B12 deficiency 05/25/2023   Vitamin D deficiency 05/25/2023   Coronary artery disease involving native coronary artery of native heart with angina pectoris (HCC) 07/24/2022   Type 2 diabetes mellitus with peripheral angiopathy (HCC) 05/18/2022   Ischemic cardiomyopathy 08/09/2019   Essential hypertension  08/09/2019   Hyperlipidemia associated with type 2 diabetes mellitus (HCC) 08/09/2019   Morbid obesity (HCC) 08/09/2019   Diabetes mellitus (HCC) 08/09/2019   NSTEMI (non-ST elevated myocardial infarction) (HCC) 07/23/2019   Hypothyroidism 07/23/2019   PCP:  Cleotilde Oneil FALCON, MD Pharmacy:   [id number] [place], [place] - [address] AT [facility] [address] [place] [address] Phone: [phone] Fax: [phone]     Social Drivers of Health (SDOH) Social History: SDOH Screenings   Food Insecurity: No Food Insecurity (03/01/2024)  Housing: Low Risk  (03/01/2024)  Transportation Needs: No Transportation Needs (03/01/2024)  Utilities: Not At Risk (03/01/2024)  Depression (PHQ2-9): Low Risk  (02/22/2024)  Financial Resource Strain: Low Risk  (07/23/2019)  Physical Activity: Unknown (07/23/2019)  Social Connections: Moderately Integrated (10/11/2023)  Stress: No Stress Concern Present (07/23/2019)  Tobacco Use: Low Risk  (03/05/2024)  Health Literacy: Adequate Health Literacy (09/29/2023)   SDOH Interventions:     Readmission Risk Interventions    03/03/2024    2:15 PM 10/05/2023    2:26 PM  Readmission Risk Prevention Plan  Transportation Screening Complete Complete  HRI or Home Care Consult  Complete  Palliative Care Screening  Complete  Medication Review (RN Care Manager) Complete Complete  PCP or Specialist appointment within 3-5 days of discharge Complete   HRI or Home Care Consult Complete   Palliative Care Screening Not Applicable   Skilled Nursing Facility Not Applicable

## 2024-03-05 NOTE — Consult Note (Addendum)
 PHARMACY - TOTAL PARENTERAL NUTRITION CONSULT NOTE   Indication: Small bowel obstruction  Patient Measurements: Height: 5' 4 (162.6 cm) Weight: 86.1 kg (189 lb 13.1 oz) IBW/kg (Calculated) : 54.7 TPN AdjBW (KG): 61.4 Body mass index is 32.58 kg/m. Usual Weight: N/A  Assessment:  58 y.o. 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    Glucose / Insulin : no history of DM Dexamethasone  10mg  IV daily ordered Insulin  requirements the last 24h: 4 units Electrolytes: electrolytes WNL Renal: Scr 1.37>1.43->>1.22 (stable) Hepatic: AST/ALT WNL Intake / Output; MIVF: net(-1.7L), MIVF stopped GI Imaging: CT abdomen/pelvis 7/11:  1. Interval marked fluid distension of the stomach with irregular wall thickening of the antral pyloric region with mucosal enhancement with otherwise decompressed appearance of the bowel, outlet obstruction or functional obstruction of the stomach cannot be excluded. NG decompression is recommended 2. Slight increased ascites within the abdomen and pelvis, now small-moderate volume. No free air. 3. New mild left-sided hydronephrosis and hydroureter, with more normal appearing caliber of distal left ureter in the pelvis, anterior to the iliac vessels. No obstructing stone. Findings are consistent with distal ureteral obstruction. Decreased excretion of contrast from the left kidney on delayed views. 4. Nodular soft tissue thickening at the right posterior bladder, stable in the short interim but suspicious for neoplasm/metastatic disease. 5. Extensive abdominal, retroperitoneal and pelvic adenopathy consistent with metastatic disease. 6. 6 mm right lower lobe pulmonary nodule, stable, and also concerning for metastatic disease. 7. Aortic atherosclerosis.  GI Surgeries / Procedures:  EGD done 7/10 Plan J-  tube/PEG placement by IR on 7/14  Central access: 7/11 TPN start date: 7/11  Nutritional Goals: Goal TPN rate is 65 mL/hr (provides 111 g of protein and 2049 kcals per day)  RD Assessment: Estimated Needs Total Energy Estimated Needs: 2000-2300kcal/day Total Protein Estimated Needs: 100-115g/day Total Fluid Estimated Needs: 1.4-1.6L/day  Current Nutrition: NPO  Plan:  Continue TPN rate of 32mL/hr at 1800 Electrolytes in TPN: Na 50mEq/L, K 50mEq/L, Ca 32mEq/L, Mg 23mEq/L(removed on 7/13), and Phos 19mmol/L(increased on 7/13). Cl:Ac 1:1 Add standard MVI and trace elements to TPN Add thiamine as well to TPN for 7 days(day 4) Continue Resistant q6h SSI and adding 10 units of regular insulin  to TPN bag while patient is on IV steroids Noted TG at 356 today. BG better controlled in he last 24hrs with baseline TG at 238 (11/18/2023). Will keep lipids on TNP for now due to TG <400 but repeat blood work in 48hrs. Monitor TPN labs on Mon/Thurs  Tonya [name] PharmD, BCPS 03/05/2024 8:12 AM

## 2024-03-05 NOTE — Plan of Care (Signed)

## 2024-03-05 NOTE — Procedures (Signed)
 Vascular and Interventional Radiology Procedure Note  Patient: Johnell Landowski DOB: 1966-08-02 Medical Record Number: [id number] Note Date/Time: 03/05/24 11:23 AM   Performing Physician: Thom Hall, MD Assistant(s): None  Diagnosis: metastatic endometrial cancer   Procedure:  PERCUTANEOUS (VENTING) GASTROSTOMY TUBE PLACEMENT DIAGNOSTIC PARACENTESIS  Anesthesia: Conscious Sedation Complications: None Estimated Blood Loss: Minimal Specimens:  Sent Cytology  Findings:  Successful placement of a 39F gastrostomy tube under fluoroscopy.  Moderate to large volume ascites. The LEFT lower quadrant peritoneal space was accessed with a 71F Pigtail catheter, and 3600 mL of fluid was obtained. Intravenous Albumin : Was not administered.   See detailed procedure note with images in PACS. The patient tolerated the procedure well without incident or complication and was returned to Recovery in stable condition.    Thom Hall, MD Vascular and Interventional Radiology Specialists [facility] Radiology   Pager. [phone] Clinic. [phone]

## 2024-03-06 DIAGNOSIS — C541 Malignant neoplasm of endometrium: Secondary | ICD-10-CM | POA: Diagnosis not present

## 2024-03-06 DIAGNOSIS — R112 Nausea with vomiting, unspecified: Secondary | ICD-10-CM | POA: Diagnosis not present

## 2024-03-06 DIAGNOSIS — E876 Hypokalemia: Secondary | ICD-10-CM | POA: Diagnosis not present

## 2024-03-06 DIAGNOSIS — K3189 Other diseases of stomach and duodenum: Secondary | ICD-10-CM | POA: Diagnosis not present

## 2024-03-06 LAB — CBC
HCT: 34.1 % — ABNORMAL LOW (ref 36.0–46.0)
Hemoglobin: 11.5 g/dL — ABNORMAL LOW (ref 12.0–15.0)
MCH: 33.6 pg (ref 26.0–34.0)
MCHC: 33.7 g/dL (ref 30.0–36.0)
MCV: 99.7 fL (ref 80.0–100.0)
Platelets: 227 K/uL (ref 150–400)
RBC: 3.42 MIL/uL — ABNORMAL LOW (ref 3.87–5.11)
RDW: 17.1 % — ABNORMAL HIGH (ref 11.5–15.5)
WBC: 23.9 K/uL — ABNORMAL HIGH (ref 4.0–10.5)
nRBC: 0.2 % (ref 0.0–0.2)

## 2024-03-06 LAB — BASIC METABOLIC PANEL WITH GFR
Anion gap: 11 (ref 5–15)
BUN: 86 mg/dL — ABNORMAL HIGH (ref 6–20)
CO2: 26 mmol/L (ref 22–32)
Calcium: 9.2 mg/dL (ref 8.9–10.3)
Chloride: 101 mmol/L (ref 98–111)
Creatinine, Ser: 1.49 mg/dL — ABNORMAL HIGH (ref 0.44–1.00)
GFR, Estimated: 40 mL/min — ABNORMAL LOW (ref >60)
Glucose, Bld: 170 mg/dL — ABNORMAL HIGH (ref 70–99)
Potassium: 5 mmol/L (ref 3.5–5.1)
Sodium: 138 mmol/L (ref 135–145)

## 2024-03-06 LAB — GLUCOSE, CAPILLARY
Glucose-Capillary: 139 mg/dL — ABNORMAL HIGH (ref 70–99)
Glucose-Capillary: 140 mg/dL — ABNORMAL HIGH (ref 70–99)
Glucose-Capillary: 146 mg/dL — ABNORMAL HIGH (ref 70–99)
Glucose-Capillary: 162 mg/dL — ABNORMAL HIGH (ref 70–99)
Glucose-Capillary: 182 mg/dL — ABNORMAL HIGH (ref 70–99)
Glucose-Capillary: 183 mg/dL — ABNORMAL HIGH (ref 70–99)
Glucose-Capillary: 188 mg/dL — ABNORMAL HIGH (ref 70–99)

## 2024-03-06 MED ORDER — MINERAL OIL RE ENEM
1.0000 | ENEMA | Freq: Once | RECTAL | Status: AC
  Administered 2024-03-06: 1 via RECTAL

## 2024-03-06 MED ORDER — LACTATED RINGERS IV SOLN: INTRAVENOUS | Status: DC

## 2024-03-06 MED ORDER — BISACODYL 10 MG RE SUPP
10.0000 mg | Freq: Every day | RECTAL | Status: DC | PRN
  Administered 2024-03-06: 10 mg via RECTAL
  Filled 2024-03-06: qty 1

## 2024-03-06 MED ORDER — TRACE MINERALS CU-MN-SE-ZN 300-55-60-3000 MCG/ML IV SOLN
INTRAVENOUS | Status: AC
  Filled 2024-03-06: qty 681.6

## 2024-03-06 NOTE — TOC Progression Note (Signed)
 Transition of Care [facility]) - Progression Note    Patient Details  Name: [name] MRN: [id number] Date of Birth: 01/13/1966  Transition of Care [facility]) CM/SW Contact  Corean ONEIDA Haddock, RN Phone Number: 03/06/2024, 2:15 PM  Clinical Narrative:     [facility] referral made to Mitch with [employer]  Expected Discharge Plan: Home w Home Health Services Barriers to Discharge: Continued Medical Work up  Expected Discharge Plan and Services     Post Acute Care Choice: Home Health Living arrangements for the past 2 months: Single Family Home                           HH Arranged: TPN HH Agency: [facility] Date HH Agency Contacted: 03/02/24   Representative spoke with at [facility] Agency: Pam   Social Determinants of Health (SDOH) Interventions SDOH Screenings   Food Insecurity: No Food Insecurity (03/01/2024)  Housing: Low Risk  (03/01/2024)  Transportation Needs: No Transportation Needs (03/01/2024)  Utilities: Not At Risk (03/01/2024)  Depression (PHQ2-9): Low Risk  (02/22/2024)  Financial Resource Strain: Low Risk  (07/23/2019)  Physical Activity: Unknown (07/23/2019)  Social Connections: Moderately Integrated (10/11/2023)  Stress: No Stress Concern Present (07/23/2019)  Tobacco Use: Low Risk  (03/05/2024)  Health Literacy: Adequate Health Literacy (09/29/2023)    Readmission Risk Interventions    03/03/2024    2:15 PM 10/05/2023    2:26 PM  Readmission Risk Prevention Plan  Transportation Screening Complete Complete  HRI or Home Care Consult  Complete  Palliative Care Screening  Complete  Medication Review (RN Care Manager) Complete Complete  PCP or Specialist appointment within 3-5 days of discharge Complete   HRI or Home Care Consult Complete   Palliative Care Screening Not Applicable   Skilled Nursing Facility Not Applicable

## 2024-03-06 NOTE — Progress Notes (Signed)
 PT Cancellation Note  Patient Details Name: Kaliyah Gladman MRN: [id number] DOB: Mar 10, 1966   Cancelled Treatment:    Reason Eval/Treat Not Completed: Medical issues which prohibited therapy  Pt awaiting suppository from nursing.  She also stated she is awaiting NG tube removal and preferred to remain in bed at this time.  Offered BSC for suppository or to return later but she requested tomorrow return.  Lauraine Gills 03/06/2024, 1:03 PM

## 2024-03-06 NOTE — Progress Notes (Signed)
 Patient is not able to walk the distance required to go the bathroom, or he/she is unable to safely negotiate stairs required to access the bathroom.  A 3in1 BSC will alleviate this problem

## 2024-03-06 NOTE — Progress Notes (Signed)
 I have reviewed and concur with this student's documentation.   Carola Beal, RN 03/06/2024 2:15 PM

## 2024-03-06 NOTE — Consult Note (Signed)
 PHARMACY - TOTAL PARENTERAL NUTRITION CONSULT NOTE   Indication: Small bowel obstruction  Patient Measurements: Height: 5' 4 (162.6 cm) Weight: 82.6 kg (182 lb 3.2 oz) IBW/kg (Calculated) : 54.7 TPN AdjBW (KG): 62.6 Body mass index is 31.27 kg/m. Usual Weight: N/A  Assessment:  58 y.o. 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   Glucose / Insulin : no history of DM Dexamethasone  10mg  IV daily ordered Insulin  requirements the last 24h: 26 units Electrolytes: electrolytes WNL Renal: Scr 1.37>1.43->>1.22 (stable) Hepatic: AST/ALT WNL Intake / Output; MIVF: net(-1.7L), MIVF stopped GI Imaging: CT abdomen/pelvis 7/11:  1. Interval marked fluid distension of the stomach with irregular wall thickening of the antral pyloric region with mucosal enhancement with otherwise decompressed appearance of the bowel, outlet obstruction or functional obstruction of the stomach cannot be excluded. NG decompression is recommended 2. Slight increased ascites within the abdomen and pelvis, now small-moderate volume. No free air. 3. New mild left-sided hydronephrosis and hydroureter, with more normal appearing caliber of distal left ureter in the pelvis, anterior to the iliac vessels. No obstructing stone. Findings are consistent with distal ureteral obstruction. Decreased excretion of contrast from the left kidney on delayed views. 4. Nodular soft tissue thickening at the right posterior bladder, stable in the short interim but suspicious for neoplasm/metastatic disease. 5. Extensive abdominal, retroperitoneal and pelvic adenopathy consistent with metastatic disease. 6. 6 mm right lower lobe pulmonary nodule, stable, and also concerning for metastatic disease. 7. Aortic atherosclerosis.  GI Surgeries / Procedures:  EGD done 7/10 7/14:  PERCUTANEOUS (VENTING) G-TUBE PLACEMENT (3600 ml of fluid removed)  Central access: 7/11 TPN start date: 7/11  Nutritional Goals: Goal TPN rate is 60 mL/hr (provides 102 g of protein and 1906 kcals per day); keeping fluid to lower part of goal range due to ascites and HFpEF  RD Assessment: Estimated Needs Total Energy Estimated Needs: 2000-2300kcal/day Total Protein Estimated Needs: 100-115g/day Total Fluid Estimated Needs: 1.4-1.6L/day  Current Nutrition: NPO  Plan:  Decrease TPN rate to 60 mL/hr at 1800  Electrolytes in TPN: Na 58mEq/L, K 69mEq/L, Ca 35mEq/L, Mg 7mEq/L(removed on 7/13), and Phos 75mmol/L(increased on 7/13). Cl:Ac 1:1 Add standard MVI and trace elements to TPN Add thiamine as well to TPN for 7 days(day 5) Continue Resistant q6h SSI and 12 units of regular insulin  on TPN bag while patient is on IV steroids Noted TG at 356 on 7/14. BG better controlled in he last 24hrs with baseline TG at 238 (11/18/2023). Will keep lipids on TNP for now due to TG <400 but repeat blood work in 48hrs. Recommendation also discussed with clinical dietitian. Monitor TPN labs on Mon/Thurs.  Savon Bordonaro Rodriguez-Guzman PharmD, BCPS 03/06/2024 8:22 AM

## 2024-03-06 NOTE — Plan of Care (Signed)

## 2024-03-06 NOTE — Progress Notes (Signed)
 OT Cancellation Note  Patient Details Name: Tonya [name] MRN: [id number] DOB: October 15, 1965   Cancelled Treatment:    Reason Eval/Treat Not Completed: Patient declined, no reason specified. Pt declining therapy 2/2 PEG tube still in. Will re-attempt at later date/time.   Dannie Woolen R., MPH, MS, OTR/L ascom [phone] 03/06/24, 1:42 PM

## 2024-03-06 NOTE — Progress Notes (Signed)
 Progress Note   Patient: Tonya [name] FMW:[id number] DOB: Oct 13, 1965 DOA: 02/29/2024     5 DOS: the patient was seen and examined on 03/06/2024   Brief hospital course: Taken from prior notes.   Orel Hord is a 58 y.o. 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  Patient had this intermittent symptoms starting early May of this year which she initially contributed to chemotherapy.  Due to severity of symptoms and unable to tolerate any p.o. she presented to ED.    CT abdomen pelvis showed interval marked improvement with distention of the stomach with irregular wall thickening of antropyloric region with mucosal enhancement with otherwise decompressed appearance of the bowel outlet obstruction or functional obstruction of stomach cannot be excluded. Blood work showed sodium 130, potassium 2.6 chloride 83 glucose 162 BUN 80 creatinine 1.3.   NG tube was inserted and GI was consulted, patient underwent EGD which shows a bleeding obstructing mass in the gastric body, also seen in duodenal bulb and pylorus.  Biopsies were taken.  Oncology was consulted.  Initial recommendations were for palliative care but patient would like to try second agent.  Patient will likely need a venting J-tube which will be challenging due to extensive mass.  7/11: Vital stable, persistent hypokalemia at 3 which is being repleted.  Persistent leukocytosis and hemoglobin of 10.  Small improvement in creatinine to 1.37.  PICC line and TPN consult was ordered as there is no other option based on her goals of care.  Oncology is going to try a second line agent. IR will try placing a venting tube and paracentesis on Monday.  Patient has very poor prognosis.  Does not want to go on hospice until exhausted all resources, stating that she has to take care of some  family matters before making that decision.  7/12: Hemodynamically stable, started on TPN.  Going for reventing PEG tube placement and paracentesis with IR on Monday.  Echocardiogram was ordered to reassess her prior HFrEF.  Patient did not follow-up with cardiology after PCI.  7/13: Hemodynamically stable, renal function and electrolytes stable, mild pseudohyponatremia secondary to hyperglycemia.  5 units of insulin  to be added for 2 TPN bag. Venting PEG tube likely be placed tomorrow by IR Repeat echo with recovered and normal EF and grade 1 diastolic dysfunction.  7/14: Remained hemodynamically stable, worsening leukocytosis, s/p venting PEG tube placement and paracentesis with removal of 3.6 L of fluid by IR today.  7/15: Hemodynamically stable, worsening leukocytosis likely reactive as she has venting PEG tube placed yesterday.  There was some concern of bleeding seen in venting PEG tube back and through NG tube, likely secondary to procedure but patient is high risk due to obstructing mass.  Hemoglobin currently stable and secretions started clearing up.  IR reevaluated and venting PEG tube seems working.  Ordered to remove NG tube today.  Assessment and Plan: * Intractable nausea and vomiting Secondary to gastric outlet obstruction with mass involving gastric body, pylorus and initial duodenum on EGD.  Likely disease progression with concern of neuroendocrine features. S/p venting PEG tube placement and paracentesis with IR today - Will remain n.p.o. except some sips of water  - PICC line was placed and starting on TPN -Continue with Reglan  and octreotide  - Continue with supportive care   Endometrial carcinoma (HCC) History of stage IV endometrial carcinoma with disease progression and neuroendocrine features. Oncology  was suggesting hospice but patient would like to try second line agent and does not want to give up at this time. Very guarded prognosis. - Oncology is working on  starting on topotecan likely from 03/13/2024- approval is in progress.   AKI (acute kidney injury) (HCC) Likely due to dehydration and poor p.o. intake, creatinine again started trending up, today at 1.49 baseline seems to be less than 1. Giving some more gentle IV fluid- -Monitor renal function -Avoid nephrotoxins  Chronic hyponatremia History of SIADH with lung mets -Sodium improved , 138 today -Continue to monitor  Hypokalemia Significant hypokalemia on admission with normal magnesium .  Has been improved - Continue to monitor and replete as needed  Chronic HFrEF (heart failure with reduced ejection fraction) (HCC) Recovered systolic function Repeat echo with normal EF and grade 1 diastolic dysfunction. EF of 40% according to echo done in December 2020. Clinically appears dry. - Monitor volume status closely while she is being getting IV fluid.  Leukocytosis Likely reactive, UA was not consistent with UTI.  No other obvious sign of infection, slight worsening leukocytosis today -Continue to monitor -Keep holding antibiotics  Normocytic anemia Hemoglobin of 11.5 today, likely due to ongoing chemotherapy and anemia of chronic disease. There was some concern of bleeding after the PEG tube placement but hemoglobin seems stable - Monitor hemoglobin -Transfuse if below 7  Hypothyroidism - Continue home Synthroid -we will switch to IV  Protein-calorie malnutrition, severe Dietitian consult   Subjective: Patient was seen and examined today.  Denies any pain.  She did not had any bowel movement since in the hospital.  Physical Exam: Vitals:   03/05/24 2004 03/06/24 0219 03/06/24 0500 03/06/24 0742  BP: 122/75 122/80  108/75  Pulse: 96 95  95  Resp: 18 18  16   Temp: 98.4 F (36.9 C) 98.7 F (37.1 C)  97.7 F (36.5 C)  TempSrc:      SpO2: 97% 97%  97%  Weight:   82.6 kg   Height:       General.  Well-developed lady, in no acute distress. Pulmonary.  Lungs clear  bilaterally, normal respiratory effort. CV.  Regular rate and rhythm, no JVD, rub or murmur. Abdomen.  Soft, nontender, nondistended, BS positive.  Venting PEG tube in place. CNS.  Alert and oriented .  No focal neurologic deficit. Extremities.  No edema, no cyanosis, pulses intact and symmetrical. Psychiatry.  Judgment and insight appears normal.   Data Reviewed: Prior data reviewed  Family Communication: Discussed with patient  Disposition: Status is: Inpatient Remains inpatient appropriate because: Severity of illness  Planned Discharge Destination: Home with home health  DVT prophylaxis.  Lovenox  Time spent: 50 minutes  This record has been created using Conservation officer, historic buildings. Errors have been sought and corrected,but may not always be located. Such creation errors do not reflect on the standard of care.   Author: Amaryllis Dare, MD 03/06/2024 1:09 PM  For on call review [email or web address].

## 2024-03-06 NOTE — Progress Notes (Signed)
 Referring Provider(s): Dr. Cort Mana, MD  Supervising Physician: Philip Cornet  Patient Status:  Tonya [name] - In-pt  Chief Complaint: Intractable nausea and vomiting; weakness;  Brief History:  Patient presented to the ED 02/29/24 with persistent nausea and vomiting over the last 2 weeks. CT of the abdomen pelvis showed marked fluid distention of the stomach which may represent gastric outlet obstruction versus functional obstruction. An NG tube was placed for decompression. She underwent EGD 03/01/24 which revealed a large gastric mass oozing blood and a separate pyloric mass going into the duodenum and was obstructing. After discussions with surgery, IR, and hospitalist agreed on placement of a venting G-tube for symptomatic relief. Patient received the venting G-tube on 7/14. Plan to discharge patient home on TPN.   Subjective:  Patient is alert, and laying in bed, calm.  Currently without any significant complaints. She is minimally uncomfortable from G-tube while laying flat, but experiences moderate discomfort specifically when rolling to her left side. Patient notes some blood in her collection bag and tubing. Patient denies any fevers, headache, chest pain, SOB, cough, nausea, or vomiting.   Allergies: Patient has no known allergies.  Medications: Prior to Admission medications   Medication Sig Start Date End Date Taking? Authorizing Provider  ARMOUR THYROID  180 MG tablet Take 180 mg by mouth daily.   Yes [provider]  pantoprazole  (PROTONIX ) 20 MG tablet Take 1 tablet (20 mg total) by mouth daily. For acid reflux 02/20/24  Yes Dasie Tinnie MATSU, NP  Cholecalciferol (VITAMIN D-1000 MAX ST) 25 MCG (1000 UT) tablet Take 1,000 Units by mouth daily. Patient not taking: No sig reported 05/25/23   [provider]  dexamethasone  (DECADRON ) 2 MG tablet Take 1 tablet (2 mg total) by mouth daily. Patient not taking: Reported on 03/05/2024 02/23/24   Dasie Tinnie MATSU, NP   losartan -hydrochlorothiazide (HYZAAR) 50-12.5 MG tablet Take 1 tablet by mouth daily. Patient not taking: No sig reported 11/28/23 11/27/24  [provider]  metoprolol  tartrate (LOPRESSOR ) 25 MG tablet Take 0.5 tablets (12.5 mg total) by mouth 2 (two) times daily. Patient not taking: Reported on 02/29/2024 10/14/23 11/21/23  Trudy Anthony HERO, MD  Multiple Vitamin (MULTI-VITAMIN) tablet Take 1 tablet by mouth daily. Patient not taking: No sig reported    [provider]  Nutritional Supplements (ENSURE HIGH PROTEIN) LIQD 237 mL 2 TIMES DAILY (route: oral) Patient not taking: No sig reported 11/08/23   [provider]  nystatin  cream (MYCOSTATIN ) Apply topically 2 (two) times daily. Patient not taking: No sig reported 11/07/23   Dorinda Drue DASEN, MD  potassium chloride  (KLOR-CON  M) 10 MEQ tablet Take 2 tablets (20 mEq total) by mouth 2 (two) times daily. Patient not taking: No sig reported 12/05/23   Dasie Tinnie MATSU, NP  sodium chloride  1 g tablet Take 1 tablet (1 g total) by mouth 2 (two) times daily with a meal. Patient not taking: No sig reported 10/06/23   Wouk, Devaughn Sayres, MD  Vitamin D, Ergocalciferol, (DRISDOL) 1.25 MG (50000 UNIT) CAPS capsule Take 50,000 Units by mouth once a week. Patient not taking: No sig reported 08/03/23   [provider]     Vital Signs: BP 108/75 (BP Location: Left Arm)   Pulse 95   Temp 97.7 F (36.5 C)   Resp 16   Ht 5' 4 (1.626 m)   Wt 182 lb 3.2 oz (82.6 kg)   LMP 07/22/2016   SpO2 97%   BMI 31.27 kg/m  Physical Exam Constitutional:      Appearance: Normal appearance.  Cardiovascular:     Rate and Rhythm: Normal rate.     Pulses: Normal pulses.  Pulmonary:     Effort: Pulmonary effort is normal.  Abdominal:     General: Abdomen is flat.     Comments: G-tube site appropriately dressed. Dressing is clean, dry, intact. T-Tack in place. Tube site minimally tender, without evidence of infection.  Approximately  10-15 cc of red blood in tubing and collection bag noted.   Musculoskeletal:        General: Normal range of motion.  Skin:    General: Skin is warm and dry.  Neurological:     Mental Status: She is alert and oriented to person, place, and time.  Psychiatric:        Mood and Affect: Mood normal.        Behavior: Behavior normal.      Labs:  CBC: Recent Labs    02/29/24 2036 03/02/24 0530 03/05/24 0610 03/06/24 0930  WBC 18.6* 18.1* 22.9* 23.9*  HGB 11.0* 10.0* 11.0* 11.5*  HCT 33.1* 29.7* 33.0* 34.1*  PLT 443* 392 268 227    COAGS: Recent Labs    03/05/24 0610  INR 1.0    BMP: Recent Labs    03/03/24 0551 03/04/24 0635 03/05/24 0610 03/06/24 0930  NA 134* 133* 136 138  K 3.7 3.7 4.2 5.0  CL 91* 92* 98 101  CO2 33* 30 27 26   GLUCOSE 192* 212* 168* 170*  BUN 62* 66* 75* 86*  CALCIUM  8.8* 9.2 9.1 9.2  CREATININE 1.26* 1.25* 1.22* 1.49*  GFRNONAA 49* 50* 51* 40*    LIVER FUNCTION TESTS: Recent Labs    01/23/24 0919 02/13/24 1438 02/29/24 2036 03/05/24 0610  BILITOT 1.0 1.0 1.5* 0.4  AST 23 21 28 31   ALT 11 11 8 8   ALKPHOS 49 58 58 56  PROT 7.5 7.6 7.0 5.8*  ALBUMIN  3.7 4.0 3.4* 2.5*    Assessment and Plan:  G-tube appropriately in place and dressed. T-tack in place. Tenderness appropriate for phase of healing. Bloody output within normal range for procedure. Patient also has a bleeding gastric mass, which may result in persistent and intermittent blood in collection bag. OK to discontinue NG tube. Oral intake going forward per care team recommendation.  Patient should be seen in 1 week to ensure T-tack has appropriately migrated or for T-tack removal. IR will round on patient if still inpatient, or schedule patient for outpatient clinic appointment for this. Please reach out to IR with any further concerns or questions. IR will continue to follow.    Thank you for this interesting consult.  I greatly enjoyed meeting Neta Upadhyay and look  forward to participating in their care.   Electronically Signed: Carlin DELENA Griffon, PA-C 03/06/2024, 12:55 PM     I spent a total of 15 Minutes at the the patient's bedside AND on the patient's [name] floor or unit, greater than 50% of which was counseling/coordinating care for intractable nausea and vomiting, s/p venting G-tube placement.

## 2024-03-07 ENCOUNTER — Inpatient Hospital Stay

## 2024-03-07 DIAGNOSIS — Z515 Encounter for palliative care: Secondary | ICD-10-CM

## 2024-03-07 DIAGNOSIS — C541 Malignant neoplasm of endometrium: Secondary | ICD-10-CM | POA: Diagnosis not present

## 2024-03-07 DIAGNOSIS — E876 Hypokalemia: Secondary | ICD-10-CM | POA: Diagnosis not present

## 2024-03-07 DIAGNOSIS — I5022 Chronic systolic (congestive) heart failure: Secondary | ICD-10-CM | POA: Diagnosis not present

## 2024-03-07 DIAGNOSIS — R110 Nausea: Secondary | ICD-10-CM | POA: Diagnosis not present

## 2024-03-07 DIAGNOSIS — R112 Nausea with vomiting, unspecified: Secondary | ICD-10-CM | POA: Diagnosis not present

## 2024-03-07 DIAGNOSIS — E43 Unspecified severe protein-calorie malnutrition: Secondary | ICD-10-CM

## 2024-03-07 DIAGNOSIS — K3189 Other diseases of stomach and duodenum: Secondary | ICD-10-CM | POA: Diagnosis not present

## 2024-03-07 LAB — CYTOLOGY - NON PAP

## 2024-03-07 LAB — CBC
HCT: 31.8 % — ABNORMAL LOW (ref 36.0–46.0)
Hemoglobin: 10.9 g/dL — ABNORMAL LOW (ref 12.0–15.0)
MCH: 34 pg (ref 26.0–34.0)
MCHC: 34.3 g/dL (ref 30.0–36.0)
MCV: 99.1 fL (ref 80.0–100.0)
Platelets: 200 K/uL (ref 150–400)
RBC: 3.21 MIL/uL — ABNORMAL LOW (ref 3.87–5.11)
RDW: 17.2 % — ABNORMAL HIGH (ref 11.5–15.5)
WBC: 24.2 K/uL — ABNORMAL HIGH (ref 4.0–10.5)
nRBC: 0.1 % (ref 0.0–0.2)

## 2024-03-07 LAB — BASIC METABOLIC PANEL WITH GFR
Anion gap: 9 (ref 5–15)
BUN: 91 mg/dL — ABNORMAL HIGH (ref 6–20)
CO2: 25 mmol/L (ref 22–32)
Calcium: 9.1 mg/dL (ref 8.9–10.3)
Chloride: 102 mmol/L (ref 98–111)
Creatinine, Ser: 1.5 mg/dL — ABNORMAL HIGH (ref 0.44–1.00)
GFR, Estimated: 40 mL/min — ABNORMAL LOW (ref >60)
Glucose, Bld: 162 mg/dL — ABNORMAL HIGH (ref 70–99)
Potassium: 4.7 mmol/L (ref 3.5–5.1)
Sodium: 136 mmol/L (ref 135–145)

## 2024-03-07 LAB — URINALYSIS, COMPLETE (UACMP) WITH MICROSCOPIC
Bacteria, UA: NONE SEEN
Bilirubin Urine: NEGATIVE
Glucose, UA: NEGATIVE mg/dL
Ketones, ur: NEGATIVE mg/dL
Nitrite: NEGATIVE
Protein, ur: NEGATIVE mg/dL
Specific Gravity, Urine: 1.01 (ref 1.005–1.030)
Squamous Epithelial / HPF: 0 /HPF (ref 0–5)
pH: 5 (ref 5.0–8.0)

## 2024-03-07 LAB — TRIGLYCERIDES: Triglycerides: 184 mg/dL — ABNORMAL HIGH (ref <150)

## 2024-03-07 LAB — GLUCOSE, CAPILLARY
Glucose-Capillary: 155 mg/dL — ABNORMAL HIGH (ref 70–99)
Glucose-Capillary: 158 mg/dL — ABNORMAL HIGH (ref 70–99)
Glucose-Capillary: 191 mg/dL — ABNORMAL HIGH (ref 70–99)
Glucose-Capillary: 227 mg/dL — ABNORMAL HIGH (ref 70–99)
Glucose-Capillary: 181 mg/dL — ABNORMAL HIGH (ref 70–99)

## 2024-03-07 MED ORDER — DEXAMETHASONE SODIUM PHOSPHATE 10 MG/ML IJ SOLN: 1.0000 mg | Freq: Once | INTRAMUSCULAR | Status: DC

## 2024-03-07 MED ORDER — FUROSEMIDE 10 MG/ML IJ SOLN
40.0000 mg | Freq: Once | INTRAMUSCULAR | Status: AC
  Administered 2024-03-07: 40 mg via INTRAVENOUS
  Filled 2024-03-07: qty 4

## 2024-03-07 MED ORDER — TRACE MINERALS CU-MN-SE-ZN 300-55-60-3000 MCG/ML IV SOLN
INTRAVENOUS | Status: DC
  Filled 2024-03-07 (×2): qty 681.6

## 2024-03-07 MED ORDER — DEXAMETHASONE SODIUM PHOSPHATE 10 MG/ML IJ SOLN: 2.0000 mg | INTRAMUSCULAR | Status: DC

## 2024-03-07 MED ORDER — DEXAMETHASONE SODIUM PHOSPHATE 10 MG/ML IJ SOLN
6.0000 mg | INTRAMUSCULAR | Status: AC
  Administered 2024-03-07 – 2024-03-08 (×2): 6 mg via INTRAVENOUS
  Filled 2024-03-07 (×2): qty 1

## 2024-03-07 MED ORDER — DEXAMETHASONE SODIUM PHOSPHATE 10 MG/ML IJ SOLN: 4.0000 mg | INTRAMUSCULAR | Status: DC

## 2024-03-07 NOTE — Plan of Care (Signed)

## 2024-03-07 NOTE — Progress Notes (Signed)
 Physical Therapy Treatment Patient Details Name: Tonya [name] MRN: [id number] DOB: 03-26-1966 Today's Date: 03/07/2024   History of Present Illness Pt is a 58 y.o. female who presents with persistent nausea and vomiting over the last 2 weeks. She underwent EGD 03/01/24 which revealed a large gastric mass oozing blood and a separate pyloric mass going into the duodenum and was obstructing. PMH: CAD, NSTEMI, hypertension, type 2 diabetes, hypothyroidism, and metastatic uterine cancer    PT Comments  Pt seen for PT tx with pt agreeable. Discussed d/c plans & DME needs; PT encouraged hospital bed but pt declines at this time, noting there's not enough room in her home. Pt reports she sleeps in her lift chair, has a BSC & RW. Pt agreeable to performing BLE exercises, requiring AAROM & cuing for technique. After 3 exercises pt politely requests to rest. Will follow pt acutely to progress mobility as able.    If plan is discharge home, recommend the following: A little help with walking and/or transfers;A little help with bathing/dressing/bathroom;Assistance with cooking/housework;Assist for transportation;Help with stairs or ramp for entrance   Can travel by private vehicle        Equipment Recommendations  BSC/3in1    Recommendations for Other Services       Precautions / Restrictions Precautions Precautions: Fall Restrictions Weight Bearing Restrictions Per Provider Order: No Other Position/Activity Restrictions: TPN feeds     Mobility  Bed Mobility                    Transfers                        Ambulation/Gait                   Stairs             Wheelchair Mobility     Tilt Bed    Modified Rankin (Stroke Patients Only)       Balance                                            Communication Communication Communication: No apparent difficulties  Cognition Arousal: Alert Behavior During Therapy: WFL for tasks  assessed/performed   PT - Cognitive impairments: No apparent impairments                         Following commands: Intact      Cueing Cueing Techniques: Verbal cues, Gestural cues  Exercises General Exercises - Lower Extremity Ankle Circles/Pumps: Supine, AROM, Both, 10 reps Heel Slides: AAROM, Supine, Strengthening, Both, 10 reps Hip ABduction/ADduction: AAROM, Supine, Strengthening, Both (x 8 reps, limited by tightness in B hips)    General Comments        Pertinent Vitals/Pain Pain Assessment Pain Assessment: No/denies pain    Home Living                          Prior Function            PT Goals (current goals can now be found in the care plan section) Acute Rehab PT Goals Patient Stated Goal: go home PT Goal Formulation: With patient/family Time For Goal Achievement: 03/17/24 Potential to Achieve Goals: Fair Progress towards PT goals: Progressing toward goals    Frequency  Min 3X/week      PT Plan      Co-evaluation              AM-PAC PT 6 Clicks Mobility   Outcome Measure  Help needed turning from your back to your side while in a flat bed without using bedrails?: A Little Help needed moving from lying on your back to sitting on the side of a flat bed without using bedrails?: A Little Help needed moving to and from a bed to a chair (including a wheelchair)?: A Little Help needed standing up from a chair using your arms (e.g., wheelchair or bedside chair)?: A Little Help needed to walk in hospital room?: A Little Help needed climbing 3-5 steps with a railing? : A Lot 6 Click Score: 17    End of Session   Activity Tolerance: Patient limited by fatigue Patient left: in bed;with call bell/phone within reach;with bed alarm set   PT Visit Diagnosis: Unsteadiness on feet (R26.81);Muscle weakness (generalized) (M62.81);Difficulty in walking, not elsewhere classified (R26.2);History of falling (Z91.81);Other  abnormalities of gait and mobility (R26.89)     Time: 8545-8488 PT Time Calculation (min) (ACUTE ONLY): 17 min  Charges:    $Therapeutic Activity: 8-22 mins PT General Charges $$ ACUTE PT VISIT: 1 Visit                     Tonya [name], PT, DPT 03/07/24, 3:22 PM    Tonya [name] 03/07/2024, 3:20 PM

## 2024-03-07 NOTE — Progress Notes (Signed)
 Occupational Therapy Treatment Patient Details Name: Tonya [name] MRN: [id number] DOB: [birth year] Today's Date: 03/07/2024   History of present illness Pt is a 58 y.o. female who presents with persistent nausea and vomiting over the last 2 weeks. She underwent EGD 03/01/24 which revealed a large gastric mass oozing blood and a separate pyloric mass going into the duodenum and was obstructing. PMH: CAD, NSTEMI, hypertension, type 2 diabetes, hypothyroidism, and metastatic uterine cancer   OT comments  Tonya [name] presents with generalized weakness, states she is feeling perhaps a little better today but still with very low endurance level. She denies pain this date, reports tenderness at abdominal incision site. Reports no nausea or vomiting today. Pt expresses interest in working to improve her endurance level. Engaged in bed mobility, transfers, static sitting and standing with RW, UB and LB therex seated in recliner. Provided educ re: energy conservation strategies, home modifications, home equipment (pt states she may be interested in a hospital bed at a later date -- she is currently sleeping in a recliner at home). Provided therapeutic listening as pt described her cancer journey and concerns about dying (primarily centered around how her husband and son will be able to cope). Pt left seated in recliner, with encouragement to remain seated there rather than in bed for as long as she is able. RN notified. Palliative Medicine NP in room at end of session.       If plan is discharge home, recommend the following:  A lot of help with walking and/or transfers;A lot of help with bathing/dressing/bathroom;Assistance with cooking/housework;Assist for transportation;Help with stairs or ramp for entrance   Equipment Recommendations       Recommendations for Other Services      Precautions / Restrictions Precautions Precautions: Fall Recall of Precautions/Restrictions: Intact Restrictions Weight  Bearing Restrictions Per Provider Order: No Other Position/Activity Restrictions: TPN feeds       Mobility Bed Mobility Overal bed mobility: Needs Assistance Bed Mobility: Supine to Sit     Supine to sit: HOB elevated, Min assist     General bed mobility comments: Min A for trunk support during supine<sit    Transfers Overall transfer level: Needs assistance Equipment used: Rolling walker (2 wheels) Transfers: Sit to/from Stand, Bed to chair/wheelchair/BSC Sit to Stand: Supervision     Step pivot transfers: Contact guard assist     General transfer comment: Pt comes SOB during transfers     Balance Overall balance assessment: Needs assistance Sitting-balance support: Feet supported Sitting balance-Leahy Scale: Good     Standing balance support: Bilateral upper extremity supported Standing balance-Leahy Scale: Good Standing balance comment: Pt able to perform static standing for ~ 2 minutes with good balance, then has to sit, 2/2 fatigue                           ADL either performed or assessed with clinical judgement   ADL Overall ADL's : Needs assistance/impaired                 Upper Body Dressing : Modified independent;Sitting                          Extremity/Trunk Assessment Upper Extremity Assessment Upper Extremity Assessment: Generalized weakness   Lower Extremity Assessment Lower Extremity Assessment: Generalized weakness        Vision       Perception     Praxis  Communication Communication Communication: No apparent difficulties   Cognition Arousal: Alert Behavior During Therapy: WFL for tasks assessed/performed Cognition: No apparent impairments                               Following commands: Intact        Cueing      Exercises Other Exercises Other Exercises: importance of gradual return to mobility, increasing activity level day by day. UB and LB therex in sitting. Therapeutic  listening.    Shoulder Instructions       General Comments      Pertinent Vitals/ Pain       Pain Assessment Pain Assessment: No/denies pain (denies pain, although states PEG incision site feels tender)  Home Living                                          Prior Functioning/Environment              Frequency  Min 3X/week        Progress Toward Goals  OT Goals(current goals can now be found in the care plan section)  Progress towards OT goals: Progressing toward goals     Plan      Co-evaluation                 AM-PAC OT 6 Clicks Daily Activity     Outcome Measure   Help from another person eating meals?: None Help from another person taking care of personal grooming?: A Little Help from another person toileting, which includes using toliet, bedpan, or urinal?: A Lot Help from another person bathing (including washing, rinsing, drying)?: A Lot Help from another person to put on and taking off regular upper body clothing?: A Little Help from another person to put on and taking off regular lower body clothing?: A Lot 6 Click Score: 16    End of Session Equipment Utilized During Treatment: Rolling walker (2 wheels)  OT Visit Diagnosis: Unsteadiness on feet (R26.81);Other abnormalities of gait and mobility (R26.89);History of falling (Z91.81);Muscle weakness (generalized) (M62.81)   Activity Tolerance Patient limited by fatigue;Patient tolerated treatment well   Patient Left in chair;with call bell/phone within reach;with chair alarm set;with nursing/sitter in room   Nurse Communication Mobility status        Time: 8964-8874 OT Time Calculation (min): 50 min  Charges: OT General Charges $OT Visit: 1 Visit OT Treatments $Self Care/Home Management : 38-52 mins  Suzen Hock, PhD, MS, OTR/L 03/07/24, 11:42 AM

## 2024-03-07 NOTE — Consult Note (Signed)
 Consultation Note Date: 03/07/2024 at 0945  Patient Name: Tonya [name]  DOB: Sep 20, 1965  MRN: [id number]  Age / Sex: 58 y.o., female  PCP: Cleotilde Oneil FALCON, MD Referring Physician: Caleen Qualia, MD  HPI/Patient Profile: 57 y.o. 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  Upon presentation to the ED, CT of abdomen and pelvis revealed interval marked improvement with distention of the stomach with irregular wall thickening of antropyloric region with mucosal enhancement and otherwise decompressed appearance of the bowel outlet obstruction or functional obstruction of stomach could not be excluded.  NG tube was inserted and GI was consulted.  Patient underwent EGD which revealed a bleeding obstructing masses in the gastric body as well as in the duodenal bulb and pylorus.  7/12 TPN initiated. 7/14 patient received venting PEG tube and paracentesis with removal of 3.6 L of fluid. 7/15 NG tube removed.  PMT was consulted to support patient with goals of care discussions.  Clinical Assessment and Goals of Care: Extensive chart review completed prior to meeting patient including labs, vital signs, imaging, progress notes, orders, and available advanced directive documents from current and previous encounters. I then met with patient at bedside to discuss diagnosis prognosis, GOC, EOL wishes, disposition and options.  I introduced Palliative Medicine as specialized medical care for people living with serious illness. It focuses on providing relief from the symptoms and stress of a serious illness. The goal is to improve quality of life for both the patient and the family.  Patient is familiar with PMT as she has worked with PMT colleagues at  the [facility] cancer center.  As far as functional and nutritional status patient endorses that PTA she needs extensive assistance to get out of bed and perform ADLs.  She shares that her husband helps her with these.  She endorses she felt very nauseous PTA but now feels well given her medications and venting PEG.  We discussed patient's current illness, treatment plan, and goals/boundaries of care.  Patient wishes to continue with current plan of care until all other options have been exhausted.  Patient shares concern that she will not be able to transport back and forth from the phone to chemotherapy appointments.  She request that her appointments be as late in the day as possible so that her husband does not have to take off too much work in order to help transport her back and forth to appointments.  During our discussion, Occupational Therapy came in to work with patient.  PMT info given to patient.  Advised patient to reach out with any acute palliative needs.  PMT will continue to follow and support.  No change to plan of care at this time.  Primary Decision Maker PATIENT  Physical Exam Vitals reviewed.  Constitutional:      General: She is not in acute distress.    Appearance: She is obese. She is  not ill-appearing.  HENT:     Nose: Nose normal.     Mouth/Throat:     Mouth: Mucous membranes are moist.  Eyes:     Pupils: Pupils are equal, round, and reactive to light.  Pulmonary:     Effort: Pulmonary effort is normal.  Abdominal:     Palpations: Abdomen is soft.  Skin:    General: Skin is warm and dry.     Coloration: Skin is pale.     Comments: PEG tube sit e- clean, dry, no erythema noted, not warm to touch  Neurological:     Mental Status: She is alert and oriented to person, place, and time.  Psychiatric:        Mood and Affect: Mood normal.        Behavior: Behavior normal.        Thought Content: Thought content normal.        Judgment: Judgment normal.      Palliative Assessment/Data: 60-70%     Thank you for this consult. Palliative medicine will continue to follow and assist holistically.   Time Total: 75 minutes  Time spent includes: Detailed review of medical records (labs, imaging, vital signs), medically appropriate exam (mental status, respiratory, cardiac, skin), discussed with treatment team, counseling and educating patient, family and staff, documenting clinical information, medication management and coordination of care.  Signed by: Lamarr Gunner, DNP, FNP-BC Palliative Medicine   Please contact Palliative Medicine Team providers via [facility] for questions and concerns.

## 2024-03-07 NOTE — Progress Notes (Signed)
 Progress Note   Patient: Tonya [name] FMW:[id number] DOB: 1965/11/02 DOA: 02/29/2024     6 DOS: the patient was seen and examined on 03/07/2024   Brief hospital course: Taken from prior notes.   Tonya [name] is a 58 y.o. 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  Patient had this intermittent symptoms starting early May of this year which she initially contributed to chemotherapy.  Due to severity of symptoms and unable to tolerate any p.o. she presented to ED.    CT abdomen pelvis showed interval marked improvement with distention of the stomach with irregular wall thickening of antropyloric region with mucosal enhancement with otherwise decompressed appearance of the bowel outlet obstruction or functional obstruction of stomach cannot be excluded. Blood work showed sodium 130, potassium 2.6 chloride 83 glucose 162 BUN 80 creatinine 1.3.   NG tube was inserted and GI was consulted, patient underwent EGD which shows a bleeding obstructing mass in the gastric body, also seen in duodenal bulb and pylorus.  Biopsies were taken.  Oncology was consulted.  Initial recommendations were for palliative care but patient would like to try second agent.  Patient will likely need a venting J-tube which will be challenging due to extensive mass.  7/11: Vital stable, persistent hypokalemia at 3 which is being repleted.  Persistent leukocytosis and hemoglobin of 10.  Small improvement in creatinine to 1.37.  PICC line and TPN consult was ordered as there is no other option based on her goals of care.  Oncology is going to try a second line agent. IR will try placing a venting tube and paracentesis on Monday.  Patient has very poor prognosis.  Does not want to go on hospice until exhausted all resources, stating that she has to take care of some  family matters before making that decision.  7/12: Hemodynamically stable, started on TPN.  Going for reventing PEG tube placement and paracentesis with IR on Monday.  Echocardiogram was ordered to reassess her prior HFrEF.  Patient did not follow-up with cardiology after PCI.  7/13: Hemodynamically stable, renal function and electrolytes stable, mild pseudohyponatremia secondary to hyperglycemia.  5 units of insulin  to be added for 2 TPN bag. Venting PEG tube likely be placed tomorrow by IR Repeat echo with recovered and normal EF and grade 1 diastolic dysfunction.  7/14: Remained hemodynamically stable, worsening leukocytosis, s/p venting PEG tube placement and paracentesis with removal of 3.6 L of fluid by IR today.  7/15: Hemodynamically stable, worsening leukocytosis likely reactive as she has venting PEG tube placed yesterday.  There was some concern of bleeding seen in venting PEG tube back and through NG tube, likely secondary to procedure but patient is high risk due to obstructing mass.  Hemoglobin currently stable and secretions started clearing up.  IR reevaluated and venting PEG tube seems working.  Ordered to remove NG tube today.  7/16: Hemodynamically stable, worsening renal function and increase in weight so IV fluid was discontinued, ordered 1 dose of IV Lasix .  Worsening leukocytosis with patient was on Decadron , no other obvious infection.  NG tube was removed. Patient husband is not available today so discharge is postponed, IV team has to educate a family member regarding TPN.  Assessment and Plan: * Intractable nausea and vomiting Secondary to gastric outlet obstruction with mass involving gastric body, pylorus and initial duodenum on EGD.  Likely disease progression with concern  of neuroendocrine features. S/p venting PEG tube placement and paracentesis with IR on 7/14. - Will remain n.p.o. except some sips of water  - PICC line was placed and starting on TPN -Continue  with Reglan  and octreotide  - Continue with supportive care   Endometrial carcinoma (HCC) History of stage IV endometrial carcinoma with disease progression and neuroendocrine features. Oncology was suggesting hospice but patient would like to try second line agent and does not want to give up at this time. Very guarded prognosis. - Oncology is working on starting on topotecan likely from 03/13/2024- approval is in progress.   AKI (acute kidney injury) (HCC) Likely due to dehydration and poor p.o. intake, worsening creatinine now at 1.50, also has increasing weight.  Baseline seems to be less than 1. Ordered 1 dose of IV Lasix  -Monitor renal function -Avoid nephrotoxins  Chronic hyponatremia History of SIADH with lung mets -Sodium improved. -Continue to monitor  Hypokalemia Significant hypokalemia on admission with normal magnesium .  Has been improved - Continue to monitor and replete as needed  Chronic HFrEF (heart failure with reduced ejection fraction) (HCC) Recovered systolic function Repeat echo with normal EF and grade 1 diastolic dysfunction. EF of 40% according to echo done in December 2020. Clinically appears dry. - Monitor volume status closely while she is being getting IV fluid.  Leukocytosis Likely reactive, UA was not consistent with UTI.  No other obvious sign of infection, worsening leukocytosis, patient was also on Decadron  which is now being tapered. -Continue to monitor -Keep holding antibiotics  Normocytic anemia Hemoglobin of 11.5 today, likely due to ongoing chemotherapy and anemia of chronic disease. There was some concern of bleeding after the PEG tube placement but hemoglobin seems stable - Monitor hemoglobin -Transfuse if below 7  Hypothyroidism - Continue home Synthroid -we will switch to IV  Protein-calorie malnutrition, severe Dietitian consult  Subjective: Patient was seen and examined today.  Denies any pain.  Still having significant  discharge from venting PEG.  Per patient her husband is not available until tomorrow after 3 for discharge.  Physical Exam: Vitals:   03/07/24 0441 03/07/24 0442 03/07/24 0733 03/07/24 0830  BP: 129/83  137/85 120/81  Pulse: 94  94   Resp: 16  16   Temp: 97.7 F (36.5 C)   97.7 F (36.5 C)  TempSrc:    Oral  SpO2: 98%  98%   Weight:  85.2 kg    Height:       General.  Well-developed lady, in no acute distress. Pulmonary.  Lungs clear bilaterally, normal respiratory effort. CV.  Regular rate and rhythm, no JVD, rub or murmur. Abdomen.  Soft, nontender, nondistended, BS positive.  Venting PEG tube in place. CNS.  Alert and oriented .  No focal neurologic deficit. Extremities.  No edema, pulses intact and symmetrical. Psychiatry.  Judgment and insight appears normal.    Data Reviewed: Prior data reviewed  Family Communication: Discussed with patient  Disposition: Status is: Inpatient Remains inpatient appropriate because: Severity of illness  Planned Discharge Destination: Home with home health  DVT prophylaxis.  Lovenox  Time spent: 45 minutes  This record has been created using Conservation officer, historic buildings. Errors have been sought and corrected,but may not always be located. Such creation errors do not reflect on the standard of care.   Author: Amaryllis Dare, MD 03/07/2024 2:20 PM  For on call review [email or web address].

## 2024-03-07 NOTE — Consult Note (Signed)
 PHARMACY - TOTAL PARENTERAL NUTRITION CONSULT NOTE   Indication: Small bowel obstruction  Patient Measurements: Height: 5' 4 (162.6 cm) Weight: 85.2 kg (187 lb 14.4 oz) IBW/kg (Calculated) : 54.7 TPN AdjBW (KG): 62.6 Body mass index is 32.25 kg/m. Usual Weight: N/A  Assessment:  58 y.o. 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   Glucose / Insulin : no history of DM Dexamethasone  10mg  IV daily ordered; initiate taper per Dr Caleen Insulin  requirements the last 24h: 25 units Electrolytes: electrolytes WNL Renal: Scr 1.5 Hepatic: AST/ALT WNL Intake / Output; +2205 ml MIVF: LR @ 100 ml/hr per provider  GI Imaging: CT abdomen/pelvis 7/11:  1. Interval marked fluid distension of the stomach with irregular wall thickening of the antral pyloric region with mucosal enhancement with otherwise decompressed appearance of the bowel, outlet obstruction or functional obstruction of the stomach cannot be excluded. NG decompression is recommended 2. Slight increased ascites within the abdomen and pelvis, now small-moderate volume. No free air. 3. New mild left-sided hydronephrosis and hydroureter, with more normal appearing caliber of distal left ureter in the pelvis, anterior to the iliac vessels. No obstructing stone. Findings are consistent with distal ureteral obstruction. Decreased excretion of contrast from the left kidney on delayed views. 4. Nodular soft tissue thickening at the right posterior bladder, stable in the short interim but suspicious for neoplasm/metastatic disease. 5. Extensive abdominal, retroperitoneal and pelvic adenopathy consistent with metastatic disease. 6. 6 mm right lower lobe pulmonary nodule, stable, and also concerning for metastatic disease. 7. Aortic atherosclerosis.  GI Surgeries / Procedures:  EGD  done 7/10 7/14: PERCUTANEOUS (VENTING) G-TUBE PLACEMENT (3600 ml of fluid removed)  Central access: 7/11 TPN start date: 7/11  Nutritional Goals: Goal TPN rate 60 mL/hr (provides 102 g of protein and 1906 kcals per day)  7/15:Trying to keep fluid low due to HFpEF and ascites. Provider wants fluid increase due to bump in Scr 7/16: Dr Caleen and Augustin (clinical dietitian) agreed on keeping TPN rate at 58ml/hr.  RD Assessment: Estimated Needs Total Energy Estimated Needs: 2000-2300kcal/day Total Protein Estimated Needs: 100-115g/day Total Fluid Estimated Needs: 1.4-1.6L/day  Current Nutrition: NPO  Plan:  Continue TPN rate at 60 mL/hr  Electrolytes in TPN: Na 50mEq/L, K 19mEq/L, Ca 1mEq/L, Mg 80mEq/L(removed on 7/13), and Phos 47mmol/L(increased on 7/13) Cl:Ac 1:1 Add standard MVI and trace elements to TPN. Add thiamine as well to TPN for 7 days (day 6). Continue Resistant q6h SSI and decrease insulin  to 10 units of regular insulin  on TPN bag. Will adjust as dexamethasone  taper adjusted. Noted TG down to 184 today, no need to remove lipids from TPN. Monitor TPN labs on Mon/Thurs.  Tonya [name] PharmD, BCPS 03/07/2024 7:20 AM

## 2024-03-07 NOTE — Progress Notes (Signed)
 Nutrition Follow Up Note  DOCUMENTATION CODES:   Severe malnutrition in context of chronic illness  INTERVENTION:   Continue TPN per pharmacy- provides 1906kcal/day and 102g/day of protein  Continue thiamine 100mg  IV daily   Pt remains at high refeed risk; recommend monitor potassium, magnesium  and phosphorus labs daily until stable  Daily weights   NUTRITION DIAGNOSIS:   Severe Malnutrition related to chronic illness as evidenced by moderate fat depletion, moderate muscle depletion, 36 percent weight loss in 10 months. -ongoing   GOAL:   Patient will meet greater than or equal to 90% of their needs -met   MONITOR:   Labs, Weight trends, Skin, I & O's, Other (Comment) (TPN)  ASSESSMENT:   58 y/o female with h/o stage IV metastatic endometrial cancer on chemo since 2/3, hypothyroid disease, ischemic cardiomyopathy, HTN, CAD, C-diff, compression fracture, HLD, GERD, MI, CHF and SIADH who is admitted with GOO secondary to metastatic disease from large gastric & duodenal masses.  Pt s/p 35F venting G-tube placement 7/14  Met with pt in room today. Pt reports that she is feeling tired. Pt reports some pain in her abdomen after G-tube placement. G-tube with output. NGT has been removed. Pt remains NPO. Pt continues to tolerate TPN well at goal rate. Refeed labs within normal limits. Triglycerides in acceptable range. Pt is noted to have hyperglycemia; insulin  added in TPN. Per chart, pt is up ~8lbs since admission. Pt did receive a lasix  dose today. Plan is for patient to start Topotecan 7/22.   Medications reviewed and include: dexamethasone , lovenox , heparin , insulin , synthroid , reglan , octreotide , protonix , TPN  Labs reviewed: K 4.7 wnl, BUN 91(H), creat 1.50(H) P 2.6 wnl, Mg 2.2 wnl- 7/14 Triglycerides- 184 Wbc- 24.2(H), Hgb 10.9(L), Hct 31.8(L) Cbgs- 227, 191, 158, 155 x 24 hrs  Diet Order:   Diet Order             Diet NPO time specified Except for: Sips with  Meds  Diet effective midnight                  EDUCATION NEEDS:   Education needs have been addressed  Skin:  Skin Assessment: Reviewed RN Assessment  Last BM:  7/16  Height:   Ht Readings from Last 1 Encounters:  03/05/24 5' 4 (1.626 m)    Weight:   Wt Readings from Last 1 Encounters:  03/07/24 85.2 kg    Ideal Body Weight:  54.5 kg  BMI:  Body mass index is 32.25 kg/m.  Estimated Nutritional Needs:   Kcal:  2000-2300kcal/day  Protein:  100-115g/day  Fluid:  1.4-1.6L/day  [name], RD, [name] If unable to be reached, please send secure chat to RD inpatient available from 8:00a-4:00p daily

## 2024-03-07 NOTE — TOC Progression Note (Signed)
 Transition of Care [facility]) - Progression Note    Patient Details  Name: Tonya [name] MRN: [id number] Date of Birth: Mar 09, 1966  Transition of [facility]) CM/SW Contact  Lauraine JAYSON Carpen, LCSW Phone Number: 03/07/2024, 11:49 AM  Clinical Narrative:   CSW met with patient. Husband does not get off work tomorrow until 3:00. [employer] needs patient home at 3:00 at the latest so that TPN can be delivered and their RN can meet them at the home between 3:00-4:00. Patient will talk to her husband to see if he can get off work a little earlier. Patient stated she is still getting IV Lasix  and received an enema today. Patient does not have anyone else other than her husband that can take her home. She asked about potential ambulance transport but CSW explained that unless she is bedbound and unable to sit in a chair/wheelchair, insurance will not cover it.   Expected Discharge Plan: Home w Home Health Services Barriers to Discharge: Continued Medical Work up  Expected Discharge Plan and Services     Post Acute Care Choice: Home Health Living arrangements for the past 2 months: Single Family Home                           HH Arranged: TPN HH Agency: [employer] Date HH Agency Contacted: 03/02/24   Representative spoke with at [facility] Agency: Pam   Social Determinants of Health (SDOH) Interventions SDOH Screenings   Food Insecurity: No Food Insecurity (03/01/2024)  Housing: Low Risk  (03/01/2024)  Transportation Needs: No Transportation Needs (03/01/2024)  Utilities: Not At Risk (03/01/2024)  Depression (PHQ2-9): Low Risk  (02/22/2024)  Financial Resource Strain: Low Risk  (07/23/2019)  Physical Activity: Unknown (07/23/2019)  Social Connections: Moderately Integrated (10/11/2023)  Stress: No Stress Concern Present (07/23/2019)  Tobacco Use: Low Risk  (03/05/2024)  Health Literacy: Adequate Health Literacy (09/29/2023)    Readmission Risk Interventions    03/03/2024    2:15 PM 10/05/2023    2:26 PM   Readmission Risk Prevention Plan  Transportation Screening Complete Complete  HRI or Home Care Consult  Complete  Palliative Care Screening  Complete  Medication Review (RN Care Manager) Complete Complete  PCP or Specialist appointment within 3-5 days of discharge Complete   HRI or Home Care Consult Complete   Palliative Care Screening Not Applicable   Skilled Nursing Facility Not Applicable

## 2024-03-08 ENCOUNTER — Telehealth: Payer: Self-pay

## 2024-03-08 ENCOUNTER — Inpatient Hospital Stay

## 2024-03-08 ENCOUNTER — Other Ambulatory Visit: Payer: Self-pay

## 2024-03-08 ENCOUNTER — Encounter: Payer: Self-pay | Admitting: Oncology

## 2024-03-08 DIAGNOSIS — I25119 Atherosclerotic heart disease of native coronary artery with unspecified angina pectoris: Secondary | ICD-10-CM

## 2024-03-08 DIAGNOSIS — K3189 Other diseases of stomach and duodenum: Secondary | ICD-10-CM | POA: Diagnosis not present

## 2024-03-08 DIAGNOSIS — N179 Acute kidney failure, unspecified: Secondary | ICD-10-CM | POA: Diagnosis not present

## 2024-03-08 DIAGNOSIS — R112 Nausea with vomiting, unspecified: Secondary | ICD-10-CM | POA: Diagnosis not present

## 2024-03-08 DIAGNOSIS — E039 Hypothyroidism, unspecified: Secondary | ICD-10-CM | POA: Diagnosis not present

## 2024-03-08 DIAGNOSIS — R110 Nausea: Secondary | ICD-10-CM | POA: Diagnosis not present

## 2024-03-08 DIAGNOSIS — I255 Ischemic cardiomyopathy: Secondary | ICD-10-CM

## 2024-03-08 DIAGNOSIS — I10 Essential (primary) hypertension: Secondary | ICD-10-CM

## 2024-03-08 DIAGNOSIS — C541 Malignant neoplasm of endometrium: Secondary | ICD-10-CM | POA: Diagnosis not present

## 2024-03-08 DIAGNOSIS — E876 Hypokalemia: Secondary | ICD-10-CM | POA: Diagnosis not present

## 2024-03-08 LAB — COMPREHENSIVE METABOLIC PANEL WITH GFR
ALT: 8 U/L (ref 0–44)
AST: 43 U/L — ABNORMAL HIGH (ref 15–41)
Albumin: 2.2 g/dL — ABNORMAL LOW (ref 3.5–5.0)
Alkaline Phosphatase: 78 U/L (ref 38–126)
Anion gap: 10 (ref 5–15)
BUN: 94 mg/dL — ABNORMAL HIGH (ref 6–20)
CO2: 24 mmol/L (ref 22–32)
Calcium: 9 mg/dL (ref 8.9–10.3)
Chloride: 103 mmol/L (ref 98–111)
Creatinine, Ser: 1.65 mg/dL — ABNORMAL HIGH (ref 0.44–1.00)
GFR, Estimated: 36 mL/min — ABNORMAL LOW (ref >60)
Glucose, Bld: 77 mg/dL (ref 70–99)
Potassium: 4.9 mmol/L (ref 3.5–5.1)
Sodium: 137 mmol/L (ref 135–145)
Total Bilirubin: 0.6 mg/dL (ref 0.0–1.2)
Total Protein: 5.7 g/dL — ABNORMAL LOW (ref 6.5–8.1)

## 2024-03-08 LAB — CBC
HCT: 32.1 % — ABNORMAL LOW (ref 36.0–46.0)
Hemoglobin: 10.8 g/dL — ABNORMAL LOW (ref 12.0–15.0)
MCH: 33.4 pg (ref 26.0–34.0)
MCHC: 33.6 g/dL (ref 30.0–36.0)
MCV: 99.4 fL (ref 80.0–100.0)
Platelets: 204 K/uL (ref 150–400)
RBC: 3.23 MIL/uL — ABNORMAL LOW (ref 3.87–5.11)
RDW: 17.2 % — ABNORMAL HIGH (ref 11.5–15.5)
WBC: 27.5 K/uL — ABNORMAL HIGH (ref 4.0–10.5)
nRBC: 0.1 % (ref 0.0–0.2)

## 2024-03-08 LAB — GLUCOSE, CAPILLARY
Glucose-Capillary: 118 mg/dL — ABNORMAL HIGH (ref 70–99)
Glucose-Capillary: 120 mg/dL — ABNORMAL HIGH (ref 70–99)
Glucose-Capillary: 134 mg/dL — ABNORMAL HIGH (ref 70–99)
Glucose-Capillary: 136 mg/dL — ABNORMAL HIGH (ref 70–99)

## 2024-03-08 LAB — PHOSPHORUS: Phosphorus: 3.7 mg/dL (ref 2.5–4.6)

## 2024-03-08 LAB — MAGNESIUM: Magnesium: 1.8 mg/dL (ref 1.7–2.4)

## 2024-03-08 MED ORDER — TRACE MINERALS CU-MN-SE-ZN 300-55-60-3000 MCG/ML IV SOLN: INTRAVENOUS | Status: DC

## 2024-03-08 MED ORDER — TRACE MINERALS CU-MN-SE-ZN 300-55-60-3000 MCG/ML IV SOLN
INTRAVENOUS | Status: AC
  Filled 2024-03-08: qty 340.8

## 2024-03-08 MED ORDER — LEVOTHYROXINE SODIUM 100 MCG/5ML IV SOLN: 200.0000 ug | Freq: Every morning | INTRAVENOUS | Status: DC

## 2024-03-08 MED ORDER — ONDANSETRON HCL 4 MG/2ML IJ SOLN: 4.0000 mg | Freq: Four times a day (QID) | INTRAMUSCULAR | Status: DC | PRN

## 2024-03-08 MED ORDER — BISACODYL 10 MG RE SUPP
10.0000 mg | Freq: Every day | RECTAL | 0 refills | Status: DC | PRN
  Filled 2024-03-08: qty 12, 12d supply, fill #0

## 2024-03-08 MED ORDER — PANTOPRAZOLE SODIUM 40 MG IV SOLR: 40.0000 mg | Freq: Every day | INTRAVENOUS | Status: DC

## 2024-03-08 NOTE — Telephone Encounter (Signed)
 Tonya [name] reported insufficient tissue on cervical specimen from 08/2023. Request sent for 03/01/2024 specimen, stomach, [phone].

## 2024-03-08 NOTE — Consult Note (Addendum)
 PHARMACY - TOTAL PARENTERAL NUTRITION CONSULT NOTE   Indication: Small bowel obstruction  Patient Measurements: Height: 5' 4 (162.6 cm) Weight: 85.2 kg (187 lb 14.4 oz) IBW/kg (Calculated) : 54.7 TPN AdjBW (KG): 62.6 Body mass index is 32.25 kg/m. Usual Weight: N/A  Assessment:  58 y.o. 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   Glucose / Insulin : no history of DM Dexamethasone  10mg  IV daily ordered; initiate taper per Dr Caleen Insulin  requirements the last 24h: 23 units Electrolytes: electrolytes WNL Renal: Scr 1.5 Hepatic: AST/ALT WNL Intake / Output; net +2205 ml > +1872 MIVF: LR @ 100 ml/hr per provider  GI Imaging: CT abdomen/pelvis 7/11:  1. Interval marked fluid distension of the stomach with irregular wall thickening of the antral pyloric region with mucosal enhancement with otherwise decompressed appearance of the bowel, outlet obstruction or functional obstruction of the stomach cannot be excluded. NG decompression is recommended 2. Slight increased ascites within the abdomen and pelvis, now small-moderate volume. No free air. 3. New mild left-sided hydronephrosis and hydroureter, with more normal appearing caliber of distal left ureter in the pelvis, anterior to the iliac vessels. No obstructing stone. Findings are consistent with distal ureteral obstruction. Decreased excretion of contrast from the left kidney on delayed views. 4. Nodular soft tissue thickening at the right posterior bladder, stable in the short interim but suspicious for neoplasm/metastatic disease. 5. Extensive abdominal, retroperitoneal and pelvic adenopathy consistent with metastatic disease. 6. 6 mm right lower lobe pulmonary nodule, stable, and also concerning for metastatic disease. 7. Aortic atherosclerosis.  GI Surgeries /  Procedures:  EGD done 7/10 7/14: PERCUTANEOUS (VENTING) G-TUBE PLACEMENT (3600 ml of fluid removed)  Central access: 7/11 TPN start date: 7/11  Nutritional Goals: Goal TPN rate 60 mL/hr (provides 102 g of protein and 1906 kcals per day)  7/15:Trying to keep fluid low due to HFpEF and ascites. Provider wants fluid increase due to bump in Scr 7/16: Dr Caleen and Augustin (clinical dietitian) agreed on keeping TPN rate at 43ml/hr.  RD Assessment: Estimated Needs Total Energy Estimated Needs: 2000-2300kcal/day Total Protein Estimated Needs: 100-115g/day Total Fluid Estimated Needs: 1.4-1.6L/day  Current Nutrition: NPO  Plan:  Continue TPN rate at 60 mL/hr  Electrolytes in TPN: Na 50mEq/L, K 22mEq/L (decreased 7/17), Ca 86mEq/L, Mg 57mEq/L (added back 7/17), and Phos 41mmol/L(increased on 7/13) Cl:Ac 1:1 Add standard MVI and trace elements to TPN. Add thiamine as well to TPN for 7 days (day 7) - last day Continue Resistant q6h SSI and decrease insulin  to 10 units of regular insulin  on TPN bag. Will adjust as dexamethasone  taper adjusted. TG down to 184, no need to remove lipids from TPN Monitor TPN labs on Mon/Thurs  Lodie Waheed Rodriguez-Guzman PharmD, BCPS 03/08/2024 8:30 AM

## 2024-03-08 NOTE — TOC Transition Note (Signed)
 Transition of Care [facility]) - Discharge Note   Patient Details  Name: Tonya [name] MRN: [id number] Date of Birth: Jan 15, 1966  Transition of Care [facility]) CM/SW Contact:  Lauraine JAYSON Carpen, LCSW Phone Number: 03/08/2024, 11:45 AM   Clinical Narrative:  Patient has orders to discharge home today. 3-in-1 will be delivered to the home today. [employer] liaison will call patient to notify. No further concerns. CSW signing off.   Final next level of care: Home w Home Health Services Barriers to Discharge: Barriers Resolved   Patient Goals and CMS Choice            Discharge Placement                Patient to be transferred to facility by: Husband   Patient and family notified of of transfer: 03/08/24  Discharge Plan and Services Additional resources added to the After Visit Summary for       Post Acute Care Choice: Home Health          DME Arranged: 3-N-1 DME Agency: [employer] Date DME Agency Contacted: 03/08/24   Representative spoke with at DME Agency: [facility] HH Arranged: RN, PT, OT, Nurse's Aide, TPN HH Agency: [facility], [employer] Date [facility] Agency Contacted: 03/08/24   Representative spoke with at [facility] Agency: [place]: [place], [employer]: Pam  Social Drivers of Health (SDOH) Interventions SDOH Screenings   Food Insecurity: No Food Insecurity (03/01/2024)  Housing: Low Risk  (03/01/2024)  Transportation Needs: No Transportation Needs (03/01/2024)  Utilities: Not At Risk (03/01/2024)  Depression (PHQ2-9): Low Risk  (02/22/2024)  Financial Resource Strain: Low Risk  (07/23/2019)  Physical Activity: Unknown (07/23/2019)  Social Connections: Moderately Integrated (10/11/2023)  Stress: No Stress Concern Present (07/23/2019)  Tobacco Use: Low Risk  (03/05/2024)  Health Literacy: Adequate Health Literacy (09/29/2023)     Readmission Risk Interventions    03/03/2024    2:15 PM 10/05/2023    2:26 PM  Readmission Risk Prevention Plan  Transportation Screening Complete Complete   HRI or Home Care Consult  Complete  Palliative Care Screening  Complete  Medication Review (RN Care Manager) Complete Complete  PCP or Specialist appointment within 3-5 days of discharge Complete   HRI or Home Care Consult Complete   Palliative Care Screening Not Applicable   Skilled Nursing Facility Not Applicable

## 2024-03-08 NOTE — TOC Progression Note (Addendum)
 Transition of Care [facility]) - Progression Note    Patient Details  Name: Tonya [name] MRN: [id number] Date of Birth: [birth year]  [facility] Care [facility]) CM/SW Contact  Lauraine JAYSON Carpen, LCSW Phone Number: 03/08/2024, 9:41 AM  Clinical Narrative:  Patient said husband will be here around 2:00 to pick her up. [employer] and [employer] are aware. Patient asked about her 3-in-1. [employer] liaison said patient had requested that it be delivered to the home. It has been assigned to a driver and liaison is checking to see if it will be delivered today.   9:58 am: Per MD, patient will discharge on IV Decadron  taper, IV Synthroid , and IV Protonix . CSW and infusion company were not aware of this and they will not be able to get these medications today. They come from different vendors so some may deliver tomorrow, some may deliver on Monday. Infusion RN will not be available over the weekend.  10:28 am: Per MD, will discontinue the Decadron  and patient can start getting the Synthroid  and Protonix  once the infusion company obtains it. Left message for infusion liaison to see if it is still too late to discharge today. They needed to know by 10:00 to have TPN prepared for delivery today.  11:07 am: Infusion liaison confirmed we can move forward with discharge today.  Expected Discharge Plan: Home w Home Health Services Barriers to Discharge: Continued Medical Work up  Expected Discharge Plan and Services     Post Acute Care Choice: Home Health Living arrangements for the past 2 months: Single Family Home                           HH Arranged: TPN HH Agency: [employer] Date HH Agency Contacted: 03/02/24   Representative spoke with at [facility] Agency: Pam   Social Determinants of Health (SDOH) Interventions SDOH Screenings   Food Insecurity: No Food Insecurity (03/01/2024)  Housing: Low Risk  (03/01/2024)  Transportation Needs: No Transportation Needs (03/01/2024)  Utilities: Not At Risk (03/01/2024)   Depression (PHQ2-9): Low Risk  (02/22/2024)  Financial Resource Strain: Low Risk  (07/23/2019)  Physical Activity: Unknown (07/23/2019)  Social Connections: Moderately Integrated (10/11/2023)  Stress: No Stress Concern Present (07/23/2019)  Tobacco Use: Low Risk  (03/05/2024)  Health Literacy: Adequate Health Literacy (09/29/2023)    Readmission Risk Interventions    03/03/2024    2:15 PM 10/05/2023    2:26 PM  Readmission Risk Prevention Plan  Transportation Screening Complete Complete  HRI or Home Care Consult  Complete  Palliative Care Screening  Complete  Medication Review (RN Care Manager) Complete Complete  PCP or Specialist appointment within 3-5 days of discharge Complete   HRI or Home Care Consult Complete   Palliative Care Screening Not Applicable   Skilled Nursing Facility Not Applicable

## 2024-03-08 NOTE — Progress Notes (Signed)
 Palliative Care Progress Note, Assessment & Plan   Patient Name: Tonya [name]       Date: 03/08/2024 DOB: May 08, 1966  Age: 58 y.o. MRN#: [id number] Attending Physician: No att. providers found Primary Care Physician: Cleotilde Oneil FALCON, MD Admit Date: 02/29/2024  Subjective: Patient sitting up in bed in no apparent distress.  She presents today for further recommendations.  HPI: 58 y.o. 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   Upon presentation to the ED, CT of abdomen and pelvis revealed interval marked improvement with distention of the stomach with irregular wall thickening of antropyloric region with mucosal enhancement and otherwise decompressed appearance of the bowel outlet obstruction or functional obstruction of stomach could not be excluded.   NG tube was inserted and GI was consulted.  Patient underwent EGD which revealed a bleeding obstructing masses in the gastric body as well as in the duodenal bulb and pylorus.   7/12 TPN initiated. 7/14 patient received venting PEG tube and paracentesis with removal of 3.6 L of fluid. 7/15 NG tube removed.   PMT was consulted to support patient with goals of care discussions.  Summary of counseling/coordination of care: Extensive chart review completed prior to meeting patient including labs, vital signs, imaging, progress notes, orders, and available advanced directive documents from current and previous encounters.   After reviewing the patient's chart and assessing the patient at bedside, I spoke with patient in regards to symptom management and goals of care.   Discussed next  steps and goals of care with patient.  Patient discharged today.  Reviewed that patient has been referred to palliative services at [facility].  Patient's mother with NP orders and plans to follow-up with them at future appointments.  Briefly touched on boundaries of care.  Patient continues to want to.  Full code with full scope removed.  Symptoms assessed.  Physical complaints not present with patient is certainly anxious and worried about being able to safely transfer home.  Discussed use of paper scrubs to ease transition to home.  Discussed importance of securing PEG tube but does not become dislodged or displaced.  Discussed importance of functional nutritional status significant indicators of patient's overall prognosis.  She shared she is no longer nauseous or feeling as though she needs to vomit.  Encouraged her to continue current medication regimen at home to avoid future N/V/D.  Therapeutic soft, active listening, emotional support provided.   Discussed that patient has impressive chemotherapy plan.  Treatment plan aligns with patient's current goals.  No acute palliative needs at this time.  Physical Exam Vitals reviewed.  Constitutional:      General: She is not in acute distress.    Appearance: She is obese. She is not ill-appearing.  HENT:     Head: Normocephalic.     Nose: Nose normal.     Mouth/Throat:     Mouth: Mucous membranes are moist.  Eyes:     Pupils: Pupils are equal, round, and reactive to light.  Pulmonary:     Effort: Pulmonary effort is normal.  Abdominal:     Palpations: Abdomen is soft.  Skin:    General: Skin is warm and dry.     Coloration: Skin is pale.     Comments: UTA venting PEG tube site  Neurological:     Mental Status: She is alert and oriented to person, place, and time.  Psychiatric:        Mood and Affect: Mood normal.        Behavior: Behavior normal.        Thought Content: Thought content normal.        Judgment:  Judgment normal.             Total Time 35 minutes   Time spent includes: Detailed review of medical records (labs, imaging, vital signs), medically appropriate exam (mental status, respiratory, cardiac, skin), discussed with treatment team, counseling and educating patient, family and staff, documenting clinical information, medication management and coordination of care.  Lamarr L. Arvid, DNP, FNP-BC Palliative Medicine Team

## 2024-03-08 NOTE — Telephone Encounter (Signed)
 Resulted as INSUFFICIENT  TUMOR  IN  THE  SUBMITTED  BLOCK  TO  PERFORM  CARIS  MOLECULARINTELLIGENCE PROFILING. Copy sent to HIM for scanning. Will inquire if additional tissue is available.

## 2024-03-08 NOTE — Discharge Summary (Signed)
 Physician Discharge Summary   Patient: Tonya [name] MRN: [id number] DOB: 17-Jan-1966  Admit date:     02/29/2024  Discharge date: 03/08/24  Discharge Physician: Amaryllis Dare   PCP: Cleotilde Oneil FALCON, MD   Recommendations at discharge:  Please obtain CBC and CMP on follow-up If renal functions continue to get worse-might need to see nephrology as outpatient. Follow-up with oncology on Monday.  Discharge Diagnoses: Principal Problem:   Intractable nausea and vomiting Active Problems:   Gastric mass   Endometrial carcinoma (HCC)   AKI (acute kidney injury) (HCC)   Chronic hyponatremia   Hypokalemia   Chronic HFrEF (heart failure with reduced ejection fraction) (HCC)   Leukocytosis   Normocytic anemia   Hypothyroidism   Intractable nausea   Protein-calorie malnutrition, severe   Hospital Course: Taken from prior notes.   Tonya [name] is a 58 y.o. 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  Patient had this intermittent symptoms starting early May of this year which she initially contributed to chemotherapy.  Due to severity of symptoms and unable to tolerate any p.o. she presented to ED.    CT abdomen pelvis showed interval marked improvement with distention of the stomach with irregular wall thickening of antropyloric region with mucosal enhancement with otherwise decompressed appearance of the bowel outlet obstruction or functional obstruction of stomach cannot be excluded. Blood work showed sodium 130, potassium 2.6 chloride 83 glucose 162 BUN 80 creatinine 1.3.   NG tube was inserted and GI was consulted, patient underwent EGD which shows a bleeding obstructing mass in the gastric body, also seen in duodenal bulb and pylorus.  Biopsies were taken.  Oncology was consulted.  Initial recommendations were for palliative  care but patient would like to try second agent.  Patient will likely need a venting J-tube which will be challenging due to extensive mass.  7/11: Vital stable, persistent hypokalemia at 3 which is being repleted.  Persistent leukocytosis and hemoglobin of 10.  Small improvement in creatinine to 1.37.  PICC line and TPN consult was ordered as there is no other option based on her goals of care.  Oncology is going to try a second line agent. IR will try placing a venting tube and paracentesis on Monday.  Patient has very poor prognosis.  Does not want to go on hospice until exhausted all resources, stating that she has to take care of some family matters before making that decision.  7/12: Hemodynamically stable, started on TPN.  Going for reventing PEG tube placement and paracentesis with IR on Monday.  Echocardiogram was ordered to reassess her prior HFrEF.  Patient did not follow-up with cardiology after PCI.  7/13: Hemodynamically stable, renal function and electrolytes stable, mild pseudohyponatremia secondary to hyperglycemia.  5 units of insulin  to be added for 2 TPN bag. Venting PEG tube likely be placed tomorrow by IR Repeat echo with recovered and normal EF and grade 1 diastolic dysfunction.  7/14: Remained hemodynamically stable, worsening leukocytosis, s/p venting PEG tube placement and paracentesis with removal of 3.6 L of fluid by IR today.  7/15: Hemodynamically stable, worsening leukocytosis likely reactive as she has venting PEG tube placed yesterday.  There was some concern of bleeding seen in venting PEG tube back and through NG tube, likely secondary to procedure but patient is high risk due to obstructing mass.  Hemoglobin currently stable and secretions started clearing up.  IR reevaluated and venting PEG tube seems working.  Ordered to remove NG tube today.  7/16: Hemodynamically stable, worsening renal function and increase in weight so IV fluid was discontinued, ordered 1  dose of IV Lasix .  Worsening leukocytosis with patient was on Decadron , no other obvious infection.  NG tube was removed. Patient husband is not available today so discharge is postponed, IV team has to educate a family member regarding TPN.  7/17: Remained hemodynamically stable and wants to go home.  Slowly worsening creatinine which can be monitor as outpatient.  Renal ultrasound ordered.  Patient will continue on TPN along with IV Synthroid  and Protonix  which can be arranged by her IV agency over the next few days.  Home health services was ordered.  Patient will follow-up closely with cancer center for further assistance.  Patient has very guarded prognosis and cancer center is arranging palliative care.  They are trying to arrange second line agent at her request.  Patient will continue on current management and follow-up with her providers for further assistance.  Assessment and Plan: * Intractable nausea and vomiting Secondary to gastric outlet obstruction with mass involving gastric body, pylorus and initial duodenum on EGD.  Likely disease progression with concern of neuroendocrine features. S/p venting PEG tube placement and paracentesis with IR on 7/14. - Will remain n.p.o. except some sips of water  - PICC line was placed and starting on TPN - Continue with supportive care   Endometrial carcinoma (HCC) History of stage IV endometrial carcinoma with disease progression and neuroendocrine features. Oncology was suggesting hospice but patient would like to try second line agent and does not want to give up at this time. Very guarded prognosis. - Oncology is working on starting on topotecan likely from 03/13/2024- approval is in progress.   AKI (acute kidney injury) (HCC) Likely due to dehydration and poor p.o. intake, worsening creatinine now at 1.65,  Baseline seems to be less than 1. Slowly worsening, no response to IV fluid or 1 dose of Lasix  Renal ultrasound  ordered -Monitor renal function -Avoid nephrotoxins  Chronic hyponatremia History of SIADH with lung mets -Sodium improved. -Continue to monitor  Hypokalemia Significant hypokalemia on admission with normal magnesium .  Has been improved - Continue to monitor and replete as needed  Chronic HFrEF (heart failure with reduced ejection fraction) (HCC) Recovered systolic function Repeat echo with normal EF and grade 1 diastolic dysfunction. EF of 40% according to echo done in December 2020. Clinically appears dry. - Monitor volume status closely while she is being getting IV fluid.  Leukocytosis Likely reactive, UA was not consistent with UTI.  No other obvious sign of infection, worsening leukocytosis, patient was also on Decadron  which is now being tapered. -Continue to monitor -Keep holding antibiotics-oncology will follow-up  Normocytic anemia Hemoglobin of 11.5 today, likely due to ongoing chemotherapy and anemia of chronic disease. There was some concern of bleeding after the PEG tube placement but hemoglobin seems stable - Monitor hemoglobin -Transfuse if below 7  Hypothyroidism - Continue home Synthroid -we will switch to IV  Protein-calorie malnutrition, severe Dietitian consult      Consultants: Oncology.  IR.  Gastroenterology Procedures performed: EGD.  Venting PEG tube placement.  PICC line placement Disposition: Home health Diet recommendation:  Discharge Diet Orders (From admission, onward)     Start     Ordered   03/08/24 0000  Diet - low sodium heart healthy        03/08/24 1041  NPO sips of water , patient is on TPN due to gastric outlet obstruction DISCHARGE MEDICATION: Allergies as of 03/08/2024   No Known Allergies      Medication List     STOP taking these medications    [place]our Thyroid  180 MG tablet Generic drug: thyroid    dexamethasone  2 MG tablet Commonly known as: DECADRON    Ensure High Protein Liqd    losartan -hydrochlorothiazide 50-12.5 MG tablet Commonly known as: HYZAAR   metoprolol  tartrate 25 MG tablet Commonly known as: LOPRESSOR    Multi-Vitamin tablet   nystatin  cream Commonly known as: MYCOSTATIN    pantoprazole  20 MG tablet Commonly known as: Protonix  Replaced by: pantoprazole  40 MG injection   potassium chloride  10 MEQ tablet Commonly known as: KLOR-CON  M   sodium chloride  1 g tablet   Vitamin D (Ergocalciferol) 1.25 MG (50000 UNIT) Caps capsule Commonly known as: DRISDOL   Vitamin D-1000 Max St 25 MCG (1000 UT) tablet Generic drug: Cholecalciferol       TAKE these medications    bisacodyl  10 MG suppository Commonly known as: DULCOLAX Place 1 suppository (10 mg total) rectally daily as needed for moderate constipation.   levothyroxine  100 MCG/5ML Soln injection Commonly known as: SYNTHROID , LEVOTHROID Inject 10 mLs (200 mcg total) into the vein in the morning. Start taking on: March 09, 2024   ondansetron  4 MG/2ML Soln injection Commonly known as: ZOFRAN  Inject 2 mLs (4 mg total) into the vein every 6 (six) hours as needed for nausea or vomiting.   pantoprazole  40 MG injection Commonly known as: PROTONIX  Inject 40 mg into the vein daily. Replaces: pantoprazole  20 MG tablet               Durable Medical Equipment  (From admission, onward)           Start     Ordered   03/06/24 1414  For home use only DME Bedside commode  Once       Question:  Patient needs a bedside commode to treat with the following condition  Answer:  Weakness   03/06/24 1414            Follow-up Information     [facility] Follow up.   Why: They will follow up with you for your infusion needs.        Cleotilde Oneil FALCON, MD. Schedule an appointment as soon as possible for a visit in 1 week(s).   Specialty: Internal Medicine Contact information: [phone] [facility] [address] [facility] [place] [address] [phone]          Jacobo Evalene PARAS, MD Follow up.   Specialty: Oncology Contact information: [address] [place] [address] [phone]                Discharge Exam: Filed Weights   03/05/24 1050 03/06/24 0500 03/07/24 0442  Weight: 86.1 kg 82.6 kg 85.2 kg   General.  Well-developed lady, in no acute distress. Pulmonary.  Lungs clear bilaterally, normal respiratory effort. CV.  Regular rate and rhythm, no JVD, rub or murmur. Abdomen.  Soft, nontender, nondistended, BS positive.  Venting PEG tube in place with gastric secretions CNS.  Alert and oriented .  No focal neurologic deficit. Extremities.  No edema, no cyanosis, pulses intact and symmetrical. Psychiatry.  Judgment and insight appears normal.   Condition at discharge: stable  The results of significant diagnostics from this hospitalization (including imaging, microbiology, ancillary and laboratory) are listed below for reference.   Imaging Studies:  DG Chest 2 View Result Date: 03/07/2024 CLINICAL DATA:  Leukocytosis EXAM: CHEST - 2 VIEW COMPARISON:  11/04/2023 FINDINGS: Right Port-A-Cath remains in place, unchanged. Heart and mediastinal contours are within normal limits. No focal opacities or effusions. No acute bony abnormality. IMPRESSION: No active cardiopulmonary disease. Electronically Signed   By: Franky Crease M.D.   On: 03/07/2024 11:21   IR GASTROSTOMY TUBE MOD SED Result Date: 03/05/2024 INDICATION: Venting gastrostomy. History of metastatic endometrial cancer with gastric outlet obstruction. EXAM: Procedures; 1. PERCUTANEOUS GASTROSTOMY TUBE PLACEMENT 2. ULTRASOUND-GUIDED DIAGNOSTIC AND THERAPEUTIC PARACENTESIS COMPARISON:  CT AP, 02/29/2024. MEDICATIONS: Ancef  2 gm IV; Antibiotics were administered within 1 hour of the procedure. CONTRAST:  20 mL mL of Isovue 300 administered into the gastric lumen. ANESTHESIA/SEDATION: Moderate (conscious) sedation was employed during this procedure. A total of Versed  2 mg and  Fentanyl  100 mcg was administered intravenously. Moderate Sedation Time: 22 minutes. The patient's level of consciousness and vital signs were monitored continuously by radiology nursing throughout the procedure under my direct supervision. FLUOROSCOPY TIME:  Radiation Exposure Index and estimated peak skin dose (PSD); Reference air kerma (RAK), 8.1 mGy. COMPLICATIONS: None immediate. PROCEDURE: Informed written consent was obtained from the patient and/or patient's representative following explanation of the procedure, risks, benefits and alternatives. A time out was performed prior to the initiation of the procedure. Maximal barrier sterile technique utilized including caps, mask, sterile gowns, sterile gloves, large sterile drape, hand hygiene and sterile prep. Initial ultrasound scanning demonstrates a moderate-to-large amount of ascites within the LEFT lower abdomen which was subsequently prepped and draped in the usual sterile fashion. 1% lidocaine  with epinephrine  was used for local anesthesia. An ultrasound image was saved for documentation purposed. An 8 Fr Safe-T-Centesis catheter was introduced. Paracentesis was performed. Attention was then directed to gastrostomy tube placement. The LEFT costal margin and barium opacified transverse colon were identified and avoided. Air was injected into the stomach for insufflation and visualization under fluoroscopy. Under sterile conditions and local anesthesia, 2 T tacks were utilized to pexy the anterior aspect of the stomach against the ventral abdominal wall. Contrast injection confirmed appropriate positioning of each of the T tacks. An incision was made between the T tacks and a 17 gauge trocar needle was utilized to access the stomach. Needle position was confirmed within the stomach with aspiration of air and injection of a small amount of contrast. A stiff guidewire was advanced into the gastric lumen and under intermittent fluoroscopic guidance, the access  needle was exchanged for a telescoping peel-away sheath, ultimately allowing placement of a 18 Fr balloon retention gastrostomy tube. The retention balloon was insufflated with a mixture of dilute saline and contrast and pulled taut against the anterior wall of the stomach. The external disc was cinched. Contrast injection confirms positioning within the stomach. Several spot radiographic images were obtained in various obliquities for documentation. The paracentesis catheter was removed and a dressing was applied. The patient tolerated the procedures well without immediate post procedural complication. The patient tolerated procedure well without immediate post procedural complication. FINDINGS: *After successful fluoroscopic guided placement, the gastrostomy tube is appropriately positioned with internal retention balloon against the ventral aspect of the gastric lumen. *A total of approximately 3.6 L of serous peritoneal fluid was removed. Samples were sent to the laboratory as requested by the clinical team. IMPRESSION: 1. Successful fluoroscopic insertion of a 18 Fr balloon retention gastrostomy tube for venting. The gastrostomy may be used immediately for medication administration and in 4 hrs  for the initiation of feeds. 2. Successful ultrasound-guided diagnostic and therapeutic paracentesis yielding 3.6 L of peritoneal fluid. RECOMMENDATIONS: The patient will return to Vascular Interventional Radiology (VIR) for routine feeding tube evaluation and exchange in 6 months. Thom Hall, MD [facility] Radiology Electronically Signed   By: Thom Hall M.D.   On: 03/05/2024 16:28   IR Paracentesis Result Date: 03/05/2024 INDICATION: Venting gastrostomy. History of metastatic endometrial cancer with gastric outlet obstruction. EXAM: Procedures; 1. PERCUTANEOUS GASTROSTOMY TUBE PLACEMENT 2. ULTRASOUND-GUIDED DIAGNOSTIC AND THERAPEUTIC PARACENTESIS COMPARISON:  CT  AP, 02/29/2024. MEDICATIONS: Ancef  2 gm IV; Antibiotics were administered within 1 hour of the procedure. CONTRAST:  20 mL mL of Isovue 300 administered into the gastric lumen. ANESTHESIA/SEDATION: Moderate (conscious) sedation was employed during this procedure. A total of Versed  2 mg and Fentanyl  100 mcg was administered intravenously. Moderate Sedation Time: 22 minutes. The patient's level of consciousness and vital signs were monitored continuously by radiology nursing throughout the procedure under my direct supervision. FLUOROSCOPY TIME:  Radiation Exposure Index and estimated peak skin dose (PSD); Reference air kerma (RAK), 8.1 mGy. COMPLICATIONS: None immediate. PROCEDURE: Informed written consent was obtained from the patient and/or patient's representative following explanation of the procedure, risks, benefits and alternatives. A time out was performed prior to the initiation of the procedure. Maximal barrier sterile technique utilized including caps, mask, sterile gowns, sterile gloves, large sterile drape, hand hygiene and sterile prep. Initial ultrasound scanning demonstrates a moderate-to-large amount of ascites within the LEFT lower abdomen which was subsequently prepped and draped in the usual sterile fashion. 1% lidocaine  with epinephrine  was used for local anesthesia. An ultrasound image was saved for documentation purposed. An 8 Fr Safe-T-Centesis catheter was introduced. Paracentesis was performed. Attention was then directed to gastrostomy tube placement. The LEFT costal margin and barium opacified transverse colon were identified and avoided. Air was injected into the stomach for insufflation and visualization under fluoroscopy. Under sterile conditions and local anesthesia, 2 T tacks were utilized to pexy the anterior aspect of the stomach against the ventral abdominal wall. Contrast injection confirmed appropriate positioning of each of the T tacks. An incision was made between the T tacks  and a 17 gauge trocar needle was utilized to access the stomach. Needle position was confirmed within the stomach with aspiration of air and injection of a small amount of contrast. A stiff guidewire was advanced into the gastric lumen and under intermittent fluoroscopic guidance, the access needle was exchanged for a telescoping peel-away sheath, ultimately allowing placement of a 18 Fr balloon retention gastrostomy tube. The retention balloon was insufflated with a mixture of dilute saline and contrast and pulled taut against the anterior wall of the stomach. The external disc was cinched. Contrast injection confirms positioning within the stomach. Several spot radiographic images were obtained in various obliquities for documentation. The paracentesis catheter was removed and a dressing was applied. The patient tolerated the procedures well without immediate post procedural complication. The patient tolerated procedure well without immediate post procedural complication. FINDINGS: *After successful fluoroscopic guided placement, the gastrostomy tube is appropriately positioned with internal retention balloon against the ventral aspect of the gastric lumen. *A total of approximately 3.6 L of serous peritoneal fluid was removed. Samples were sent to the laboratory as requested by the clinical team. IMPRESSION: 1. Successful fluoroscopic insertion of a 18 Fr balloon retention gastrostomy tube for venting. The gastrostomy may be used immediately for medication administration and in 4 hrs for the initiation  of feeds. 2. Successful ultrasound-guided diagnostic and therapeutic paracentesis yielding 3.6 L of peritoneal fluid. RECOMMENDATIONS: The patient will return to Vascular Interventional Radiology (VIR) for routine feeding tube evaluation and exchange in 6 months. Thom Hall, MD [facility] Radiology Electronically Signed   By: Thom Hall M.D.   On: 03/05/2024  16:28   ECHOCARDIOGRAM COMPLETE Result Date: 03/04/2024    ECHOCARDIOGRAM REPORT   Patient Name:   FATMA RUTTEN Date of Exam: 03/03/2024 Medical Rec #:  [id number]    Height:       64.0 in Accession #:    [id number]   Weight:       180.8 lb Date of Birth:  1965-10-06    BSA:          1.874 m Patient Age:    58 years     BP:           139/91 mmHg Patient Gender: F            HR:           89 bpm. Exam Location:  [facility] Procedure: 2D Echo, Cardiac Doppler, Color Doppler and Intracardiac            Opacification Agent (Both Spectral and Color Flow Doppler were            utilized during procedure). Indications:     Abnormal ECG R94.31  History:         Patient has prior history of Echocardiogram examinations, most                  recent 07/24/2019.  Sonographer:     Thedora Louder RDCS, FASE Referring Phys:  [id number] Vonita Calloway Diagnosing Phys: Redell Cave MD  Sonographer Comments: Technically difficult study due to poor echo windows and suboptimal apical window. Image acquisition challenging due to patient body habitus. IMPRESSIONS  1. Left ventricular ejection fraction, by estimation, is 60 to 65%. Left ventricular ejection fraction by 2D MOD biplane is 63.1 %. The left ventricle has normal function. The left ventricle has no regional wall motion abnormalities. Left ventricular diastolic parameters are consistent with Grade I diastolic dysfunction (impaired relaxation).  2. Right ventricular systolic function is normal. The right ventricular size is normal.  3. A small pericardial effusion is present.  4. The mitral valve is normal in structure. No evidence of mitral valve regurgitation.  5. The aortic valve is tricuspid. Aortic valve regurgitation is not visualized. Aortic valve sclerosis is present, with no evidence of aortic valve stenosis.  6. The inferior vena cava is normal in size with greater than 50% respiratory variability, suggesting right atrial pressure of 3 mmHg. FINDINGS  Left Ventricle: Left  ventricular ejection fraction, by estimation, is 60 to 65%. Left ventricular ejection fraction by 2D MOD biplane is 63.1 %. The left ventricle has normal function. The left ventricle has no regional wall motion abnormalities. Definity  contrast agent was given IV to delineate the left ventricular endocardial borders. The left ventricular internal cavity size was normal in size. There is no left ventricular hypertrophy. Left ventricular diastolic parameters are consistent with Grade I diastolic dysfunction (impaired relaxation). Right Ventricle: The right ventricular size is normal. No increase in right ventricular wall thickness. Right ventricular systolic function is normal. Left Atrium: Left atrial size was normal in size. Right Atrium: Right atrial size was normal in size. Pericardium: A small pericardial effusion is present. Mitral Valve: The mitral valve is normal in structure.  No evidence of mitral valve regurgitation. Tricuspid Valve: The tricuspid valve is normal in structure. Tricuspid valve regurgitation is not demonstrated. Aortic Valve: The aortic valve is tricuspid. Aortic valve regurgitation is not visualized. Aortic valve sclerosis is present, with no evidence of aortic valve stenosis. Aortic valve peak gradient measures 9.9 mmHg. Pulmonic Valve: The pulmonic valve was normal in structure. Pulmonic valve regurgitation is not visualized. Aorta: The aortic root is normal in size and structure. Venous: The inferior vena cava is normal in size with greater than 50% respiratory variability, suggesting right atrial pressure of 3 mmHg. IAS/Shunts: No atrial level shunt detected by color flow Doppler.  LEFT VENTRICLE PLAX 2D                        Biplane EF (MOD) LVIDd:         4.90 cm         LV Biplane EF:   Left LVIDs:         3.20 cm                          ventricular LV PW:         1.30 cm                          ejection LV IVS:        1.00 cm                          fraction by LVOT diam:     2.00  cm                          2D MOD LV SV:         53                               biplane is LV SV Index:   28                               63.1 %. LVOT Area:     3.14 cm                                Diastology                                LV e' medial:    6.31 cm/s LV Volumes (MOD)               LV E/e' medial:  8.1 LV vol d, MOD    47.7 ml       LV e' lateral:   11.40 cm/s A2C:                           LV E/e' lateral: 4.5 LV vol d, MOD    60.3 ml A4C: LV vol s, MOD    18.7 ml A2C: LV vol s, MOD    18.9 ml A4C: LV SV MOD A2C:   29.0 ml LV SV MOD A4C:   60.3 ml LV SV MOD BP:  33.7 ml RIGHT VENTRICLE RV Basal diam:  2.60 cm RV S prime:     12.80 cm/s TAPSE (M-mode): 1.6 cm LEFT ATRIUM           Index       RIGHT ATRIUM          Index LA diam:      2.90 cm 1.55 cm/m  RA Area:     7.58 cm LA Vol (A4C): 14.8 ml 7.90 ml/m  RA Volume:   11.80 ml 6.30 ml/m  AORTIC VALVE                 PULMONIC VALVE AV Area (Vmax): 1.77 cm     PV Vmax:        1.38 m/s AV Vmax:        157.00 cm/s  PV Peak grad:   7.6 mmHg AV Peak Grad:   9.9 mmHg     RVOT Peak grad: 7 mmHg LVOT Vmax:      88.40 cm/s LVOT Vmean:     59.900 cm/s LVOT VTI:       0.169 m  AORTA Ao Root diam: 3.30 cm Ao Asc diam:  2.90 cm MITRAL VALVE MV Area (PHT): 5.13 cm    SHUNTS MV Decel Time: 148 msec    Systemic VTI:  0.17 m MV E velocity: 51.00 cm/s  Systemic Diam: 2.00 cm MV A velocity: 69.20 cm/s MV E/A ratio:  0.74 Redell Cave MD Electronically signed by Redell Cave MD Signature Date/Time: 03/04/2024/12:14:06 PM    Final    US  EKG SITE RITE Result Date: 03/02/2024 If Site Rite image not attached, placement could not be confirmed due to current cardiac rhythm.  DG Abd Portable 1 View Result Date: 03/01/2024 CLINICAL DATA:  NG tube placement EXAM: PORTABLE ABDOMEN - 1 VIEW COMPARISON:  CT 02/29/2024 FINDINGS: NG tube tip is in the proximal to mid stomach. IMPRESSION: NG tube in the stomach. Electronically Signed   By: Franky Crease M.D.    On: 03/01/2024 01:42   CT ABDOMEN PELVIS W CONTRAST Result Date: 02/29/2024 CLINICAL DATA:  Nausea vomiting syncope EXAM: CT ABDOMEN AND PELVIS WITH CONTRAST TECHNIQUE: Multidetector CT imaging of the abdomen and pelvis was performed using the standard protocol following bolus administration of intravenous contrast. RADIATION DOSE REDUCTION: This exam was performed according to the departmental dose-optimization program which includes automated exposure control, adjustment of the mA and/or kV according to patient size and/or use of iterative reconstruction technique. CONTRAST:  OMNIPAQUE  IOHEXOL  300 MG/ML  SOLN COMPARISON:  CT 02/23/2024, 11/25/2023 FINDINGS: Lower chest: Lung bases demonstrate no acute airspace disease. 6 mm right lower lobe anterior pulmonary nodule series 4, image 8 again noted. Coronary vascular calcification. Hepatobiliary: Hyperdense appearance of gallbladder, possible sludge. No calcified stone. No biliary dilatation Pancreas: Unremarkable. No pancreatic ductal dilatation or surrounding inflammatory changes. Spleen: Normal in size without focal abnormality. Adrenals/Urinary Tract: Stable adrenal glands. Mild left-sided hydronephrosis and hydroureter, slightly increased compared to prior, with more normal appearing caliber of distal left ureter in the pelvis, anterior to the iliac vessels. No obstructing stone. Decreased excretion of contrast on the left on delayed views consistent with obstruction. Nodular soft tissue thickening at the right posterior bladder, measuring about 35 x 16 mm on series 2, image 75, stable in the short interim. Stomach/Bowel: Interval marked fluid distension of the stomach. Irregular wall thickening of the antral pyloric region with mucosal enhancement. Decompressed duodenum. Slightly low lying ligament of Treitz probably  displaced by gastric distension. Elsewhere no dilated bowel is seen. 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 Vascular/Lymphatic:  Moderate aortic atherosclerosis. As seen on recent prior, multiple enlarged abdominal, retroperitoneal and pelvic lymph nodes. Gastrohepatic nodes measuring up to 14 mm on series 2, image 27. Portal caval node measuring 21 mm on series 2, image 28. Index left iliac node measuring 22 mm on series 2, image 66, previously 21 mm. Reproductive: Stable appearance of the uterus and adnexal structures. Some asymmetrical soft tissue thickening of the right vulvar and perineal soft tissues, series 2 image 84. Other: No free air. Generalized subcutaneous edema. Slight increased ascites within the abdomen and pelvis, now small-moderate volume. Peritoneal metastatic disease as evidenced by stranding and nodularity of the omentum. Musculoskeletal: No new lesion. High-grade compression deformity at L1 unchanged IMPRESSION: 1. Interval marked fluid distension of the stomach with irregular wall thickening of the antral pyloric region with mucosal enhancement with otherwise decompressed appearance of the bowel, outlet obstruction or functional obstruction of the stomach cannot be excluded. NG decompression is recommended 2. Slight increased ascites within the abdomen and pelvis, now small-moderate volume. No free air. 3. New mild left-sided hydronephrosis and hydroureter, with more normal appearing caliber of distal left ureter in the pelvis, anterior to the iliac vessels. No obstructing stone. Findings are consistent with distal ureteral obstruction. Decreased excretion of contrast from the left kidney on delayed views. 4. Nodular soft tissue thickening at the right posterior bladder, stable in the short interim but suspicious for neoplasm/metastatic disease. 5. Extensive abdominal, retroperitoneal and pelvic adenopathy consistent with metastatic disease. 6. 6 mm right lower lobe pulmonary nodule, stable, and also concerning for metastatic disease. 7. Aortic atherosclerosis. Aortic Atherosclerosis (ICD10-I70.0). Electronically  Signed   By: Luke Bun M.D.   On: 02/29/2024 23:19   US  Venous Img Lower Unilateral Right Result Date: 02/29/2024 CLINICAL DATA:  Leg swelling for 1 day. Fell on Monday. History of uterine cancer. EXAM: Right LOWER EXTREMITY VENOUS DOPPLER ULTRASOUND TECHNIQUE: Gray-scale sonography with compression, as well as color and duplex ultrasound, were performed to evaluate the deep venous system(s) from the level of the common femoral vein through the popliteal and proximal calf veins. COMPARISON:  None Available. FINDINGS: VENOUS Normal compressibility of the common femoral, superficial femoral, and popliteal veins, as well as the visualized calf veins. Visualized portions of profunda femoral vein and great saphenous vein unremarkable. No filling defects to suggest DVT on grayscale or color Doppler imaging. Doppler waveforms show normal direction of venous flow, normal respiratory plasticity and response to augmentation. Limited views of the contralateral common femoral vein are unremarkable. OTHER None. Limitations: none IMPRESSION: No evidence of acute deep venous thrombosis in the visualized lower extremity veins. Electronically Signed   By: Elsie Gravely M.D.   On: 02/29/2024 21:34   CT CHEST ABDOMEN PELVIS W CONTRAST Result Date: 02/24/2024 CLINICAL DATA:  Metastatic endometrial cancer, ongoing chemotherapy * Tracking Code: BO * EXAM: CT CHEST, ABDOMEN, AND PELVIS WITH CONTRAST TECHNIQUE: Multidetector CT imaging of the chest, abdomen and pelvis was performed following the standard protocol during bolus administration of intravenous contrast. RADIATION DOSE REDUCTION: This exam was performed according to the departmental dose-optimization program which includes automated exposure control, adjustment of the mA and/or kV according to patient size and/or use of iterative reconstruction technique. CONTRAST:  OMNIPAQUE  IOHEXOL  300 MG/ML  SOLN COMPARISON:  11/25/2023 FINDINGS: CT CHEST FINDINGS  Cardiovascular: Right chest port catheter. Scattered aortic atherosclerosis. Normal heart size. Three-vessel coronary artery  calcifications. No pericardial effusion. Mediastinum/Nodes: Newly enlarged left axillary and subpectoral lymph nodes measuring up to 2.1 x 1.6 cm (series 2, image 12). Thyroid  gland, trachea, and esophagus demonstrate no significant findings. Lungs/Pleura: Interval resolution of previously seen left pleural effusion. Multiple new and enlarged bilateral pulmonary nodules, for example a 0.6 cm nodule of the anterior right lower lobe, previously no greater than 0.2 cm (series 3, image 88), and a new nodule in the posterior left upper lobe measuring 0.7 cm (series 3, image 54). Musculoskeletal: No chest wall abnormality. No acute osseous findings. CT ABDOMEN PELVIS FINDINGS Hepatobiliary: No solid liver abnormality is seen. No gallstones, gallbladder wall thickening, or biliary dilatation. Pancreas: Unremarkable. No pancreatic ductal dilatation or surrounding inflammatory changes. Spleen: Normal in size without significant abnormality. Adrenals/Urinary Tract: Adrenal glands are unremarkable. Kidneys are normal, without renal calculi, solid lesion, or hydronephrosis. New, enhancing nodular soft tissue along the posterior right aspect of the bladder dome measuring approximately 3.3 x 1.3 cm (series 2, image 101). Stomach/Bowel: Mucosal thickening and hyperenhancement of the stomach (series 2, image 61). Appendix appears normal. No evidence of bowel wall thickening, distention, or inflammatory changes. Vascular/Lymphatic: Aortic atherosclerosis. Interval enlargement of numerous abdominal and pelvic lymph nodes, involving the gastrohepatic ligament, celiac, porta hepatis, portacaval, retroperitoneal, bilateral iliac, and pelvic sidewall stations. Many lymph nodes are matted, particularly in the retroperitoneum and iliac stations. Largest index left iliac nodes measure up to 3.1 x 2.1 cm, previously on  measurable (series 2, image 71). Largest portacaval nodes measure up to 2.9 x 1.9 cm, previously 1.3 x 0.9 cm (series 2, image 57). Reproductive: Matted appearance of the uterus and ovaries, similar to prior examination (series 2, image 92). Other: No abdominal wall hernia or abnormality. Small volume ascites throughout the abdomen and pelvis, similar in volume to prior examination. Interval increase in very fine peritoneal and omental nodularity in the ventral abdomen (series 2, image 72). Musculoskeletal: No acute osseous findings. Unchanged high-grade wedge deformity of L1 (series 6, image 94). IMPRESSION: 1. Interval enlargement of numerous lymph nodes throughout the chest, abdomen, and pelvis, consistent with worsened nodal metastatic disease. 2. Multiple new and enlarged bilateral pulmonary nodules, consistent with pulmonary metastatic disease. 3. Small volume ascites throughout the abdomen pelvis, similar to prior examination. Interval increase in fine peritoneal and omental nodularity in the ventral abdomen as well as new, enhancing nodular soft tissue along the posterior right aspect of the bladder dome consistent with worsened peritoneal metastatic disease. 4. Matted post treatment appearance of the uterus and ovaries, similar to prior examination. 5. Mucosal thickening and hyperenhancement of the stomach, suggestive of nonspecific infectious or inflammatory gastritis. 6. Interval resolution of previously seen left pleural effusion. 7. Coronary artery disease. Aortic Atherosclerosis (ICD10-I70.0). Electronically Signed   By: Marolyn JONETTA Jaksch M.D.   On: 02/24/2024 16:28    Microbiology: Results for orders placed or performed during the hospital encounter of 11/04/23  Resp panel by RT-PCR (RSV, Flu A&B, Covid) Anterior Nasal Swab     Status: None   Collection Time: 11/04/23  4:17 PM   Specimen: Anterior Nasal Swab  Result Value Ref Range Status   SARS Coronavirus 2 by RT PCR NEGATIVE NEGATIVE Final     Comment: (NOTE) SARS-CoV-2 target nucleic acids are NOT DETECTED.  The SARS-CoV-2 RNA is generally detectable in upper respiratory specimens during the acute phase of infection. The lowest concentration of SARS-CoV-2 viral copies this assay can detect is 138 copies/mL. A negative result does not preclude SARS-Cov-2  infection and should not be used as the sole basis for treatment or other patient management decisions. A negative result may occur with  improper specimen collection/handling, submission of specimen other than nasopharyngeal swab, presence of viral mutation(s) within the areas targeted by this assay, and inadequate number of viral copies(<138 copies/mL). A negative result must be combined with clinical observations, patient history, and epidemiological information. The expected result is Negative.  Fact Sheet for Patients:  BloggerCourse.com  Fact Sheet for Healthcare Providers:  SeriousBroker.it  This test is no t yet approved or cleared by the [place]  FDA and  has been authorized for detection and/or diagnosis of SARS-CoV-2 by FDA under an Emergency Use Authorization (EUA). This EUA will remain  in effect (meaning this test can be used) for the duration of the COVID-19 declaration under Section 564(b)(1) of the Act, 21 U.S.C.section 360bbb-3(b)(1), unless the authorization is terminated  or revoked sooner.       Influenza A by PCR NEGATIVE NEGATIVE Final   Influenza B by PCR NEGATIVE NEGATIVE Final    Comment: (NOTE) The Xpert Xpress SARS-CoV-2/FLU/RSV plus assay is intended as an aid in the diagnosis of influenza from Nasopharyngeal swab specimens and should not be used as a sole basis for treatment. Nasal washings and aspirates are unacceptable for Xpert Xpress SARS-CoV-2/FLU/RSV testing.  Fact Sheet for Patients: BloggerCourse.com  Fact Sheet for Healthcare  Providers: SeriousBroker.it  This test is not yet approved or cleared by the [place]  FDA and has been authorized for detection and/or diagnosis of SARS-CoV-2 by FDA under an Emergency Use Authorization (EUA). This EUA will remain in effect (meaning this test can be used) for the duration of the COVID-19 declaration under Section 564(b)(1) of the Act, 21 U.S.C. section 360bbb-3(b)(1), unless the authorization is terminated or revoked.     Resp Syncytial Virus by PCR NEGATIVE NEGATIVE Final    Comment: (NOTE) Fact Sheet for Patients: BloggerCourse.com  Fact Sheet for Healthcare Providers: SeriousBroker.it  This test is not yet approved or cleared by the [place]  FDA and has been authorized for detection and/or diagnosis of SARS-CoV-2 by FDA under an Emergency Use Authorization (EUA). This EUA will remain in effect (meaning this test can be used) for the duration of the COVID-19 declaration under Section 564(b)(1) of the Act, 21 U.S.C. section 360bbb-3(b)(1), unless the authorization is terminated or revoked.  Performed at [facility], [address] [place], [place] [address]   Culture, blood (Routine X 2) w Reflex to ID Panel     Status: None   Collection Time: 11/04/23  4:18 PM   Specimen: BLOOD  Result Value Ref Range Status   Specimen Description BLOOD PORTA CATH  Final   Special Requests   Final    BOTTLES DRAWN AEROBIC AND ANAEROBIC Blood Culture adequate volume   Culture   Final    NO GROWTH 5 DAYS Performed at [facility], [address], [place], [place] [address]    Report Status 11/09/2023 FINAL  Final  Culture, blood (Routine X 2) w Reflex to ID Panel     Status: Abnormal   Collection Time: 11/04/23  4:23 PM   Specimen: BLOOD  Result Value Ref Range Status   Specimen Description   Final    BLOOD BLOOD LEFT ARM Performed at [facility], [address], [place], [place] [address]    Special Requests   Final    BOTTLES DRAWN AEROBIC AND ANAEROBIC Blood Culture results may not  be optimal due to an inadequate volume of blood received in culture bottles Performed at [facility], [address] [place], [place] [address]    Culture  Setup Time   Final    GRAM POSITIVE COCCI AEROBIC BOTTLE ONLY CRITICAL RESULT CALLED TO, READ BACK BY AND VERIFIED WITH: MADISON HUNT @ 11/05/23 2058 AB    Culture (A)  Final    STAPHYLOCOCCUS EPIDERMIDIS THE SIGNIFICANCE OF ISOLATING THIS ORGANISM FROM A SINGLE SET OF BLOOD CULTURES WHEN MULTIPLE SETS ARE DRAWN IS UNCERTAIN. PLEASE NOTIFY THE MICROBIOLOGY DEPARTMENT WITHIN ONE WEEK IF SPECIATION AND SENSITIVITIES ARE REQUIRED. Performed at [facility], [address] [place], [place] [address]    Report Status 11/07/2023 FINAL  Final  Blood Culture ID Panel (Reflexed)     Status: Abnormal   Collection Time: 11/04/23  4:23 PM  Result Value Ref Range Status   Enterococcus faecalis NOT DETECTED NOT DETECTED Final   Enterococcus Faecium NOT DETECTED NOT DETECTED Final   Listeria monocytogenes NOT DETECTED NOT DETECTED Final   Staphylococcus species DETECTED (A) NOT DETECTED Final    Comment: CRITICAL RESULT CALLED TO, READ BACK BY AND VERIFIED WITH: MADISON HUNT @ 11/05/23 2058 AB    Staphylococcus aureus (BCID) NOT DETECTED NOT DETECTED Final   Staphylococcus epidermidis DETECTED (A) NOT DETECTED Final    Comment: Methicillin (oxacillin) resistant coagulase negative staphylococcus. Possible blood culture contaminant (unless isolated from more than one blood culture draw or clinical case suggests pathogenicity). No antibiotic treatment is indicated for blood  culture contaminants. CRITICAL RESULT CALLED TO, READ BACK BY AND VERIFIED WITH: MADISON HUNT @ 11/05/23 2058 AB    Staphylococcus lugdunensis NOT DETECTED NOT DETECTED Final   Streptococcus species NOT DETECTED NOT  DETECTED Final   Streptococcus agalactiae NOT DETECTED NOT DETECTED Final   Streptococcus pneumoniae NOT DETECTED NOT DETECTED Final   Streptococcus pyogenes NOT DETECTED NOT DETECTED Final   A.calcoaceticus-baumannii NOT DETECTED NOT DETECTED Final   Bacteroides fragilis NOT DETECTED NOT DETECTED Final   Enterobacterales NOT DETECTED NOT DETECTED Final   Enterobacter cloacae complex NOT DETECTED NOT DETECTED Final   Escherichia coli NOT DETECTED NOT DETECTED Final   Klebsiella aerogenes NOT DETECTED NOT DETECTED Final   Klebsiella oxytoca NOT DETECTED NOT DETECTED Final   Klebsiella pneumoniae NOT DETECTED NOT DETECTED Final   Proteus species NOT DETECTED NOT DETECTED Final   Salmonella species NOT DETECTED NOT DETECTED Final   Serratia marcescens NOT DETECTED NOT DETECTED Final   Haemophilus influenzae NOT DETECTED NOT DETECTED Final   Neisseria meningitidis NOT DETECTED NOT DETECTED Final   Pseudomonas aeruginosa NOT DETECTED NOT DETECTED Final   Stenotrophomonas maltophilia NOT DETECTED NOT DETECTED Final   Candida albicans NOT DETECTED NOT DETECTED Final   Candida auris NOT DETECTED NOT DETECTED Final   Candida glabrata NOT DETECTED NOT DETECTED Final   Candida krusei NOT DETECTED NOT DETECTED Final   Candida parapsilosis NOT DETECTED NOT DETECTED Final   Candida tropicalis NOT DETECTED NOT DETECTED Final   Cryptococcus neoformans/gattii NOT DETECTED NOT DETECTED Final   Methicillin resistance mecA/C DETECTED (A) NOT DETECTED Final    Comment: CRITICAL RESULT CALLED TO, READ BACK BY AND VERIFIED WITH: MADISON HUNT @ 11/05/23 2058 AB Performed at [facility], [address] [place], [place] [address]     Labs: CBC: Recent Labs  Lab 03/02/24 0530 03/05/24 0610 03/06/24 0930 03/07/24 0519 03/08/24 0520  WBC 18.1* 22.9* 23.9* 24.2* 27.5*  HGB 10.0*  11.0* 11.5* 10.9* 10.8*  HCT 29.7* 33.0* 34.1* 31.8* 32.1*  MCV 98.7 98.5 99.7 99.1 99.4  PLT 392 268 227 200  204   Basic Metabolic Panel: Recent Labs  Lab 03/01/24 1315 03/02/24 0530 03/03/24 0551 03/04/24 0635 03/05/24 0610 03/06/24 0930 03/07/24 0519 03/08/24 0520  NA 130*   < > 134* 133* 136 138 136 137  K 2.8*   < > 3.7 3.7 4.2 5.0 4.7 4.9  CL 82*   < > 91* 92* 98 101 102 103  CO2 29   < > 33* 30 27 26 25 24   GLUCOSE 118*   < > 192* 212* 168* 170* 162* 77  BUN 73*   < > 62* 66* 75* 86* 91* 94*  CREATININE 1.43*   < > 1.26* 1.25* 1.22* 1.49* 1.50* 1.65*  CALCIUM  9.1   < > 8.8* 9.2 9.1 9.2 9.1 9.0  MG  --   --  2.6* 2.6* 2.2  --   --  1.8  PHOS 3.0  --  2.2* 1.4* 2.6  --   --  3.7   < > = values in this interval not displayed.   Liver Function Tests: Recent Labs  Lab 03/05/24 0610 03/08/24 0520  AST 31 43*  ALT 8 8  ALKPHOS 56 78  BILITOT 0.4 0.6  PROT 5.8* 5.7*  ALBUMIN  2.5* 2.2*   CBG: Recent Labs  Lab 03/07/24 1602 03/07/24 1945 03/08/24 0003 03/08/24 0339 03/08/24 0739  GLUCAP 227* 181* 134* 120* 136*    Discharge time spent: greater than 30 minutes.  This record has been created using Conservation officer, historic buildings. Errors have been sought and corrected,but may not always be located. Such creation errors do not reflect on the standard of care.   Signed: Amaryllis Dare, MD [facility] 03/08/2024

## 2024-03-08 NOTE — Plan of Care (Signed)

## 2024-03-09 ENCOUNTER — Inpatient Hospital Stay
Admission: EM | Admit: 2024-03-09 | Discharge: 2024-03-23 | DRG: 682 | Disposition: E | Attending: Osteopathic Medicine | Admitting: Osteopathic Medicine

## 2024-03-09 ENCOUNTER — Other Ambulatory Visit: Payer: Self-pay

## 2024-03-09 ENCOUNTER — Encounter: Payer: Self-pay | Admitting: Emergency Medicine

## 2024-03-09 DIAGNOSIS — E875 Hyperkalemia: Secondary | ICD-10-CM | POA: Diagnosis present

## 2024-03-09 DIAGNOSIS — E860 Dehydration: Principal | ICD-10-CM | POA: Diagnosis present

## 2024-03-09 DIAGNOSIS — K9423 Gastrostomy malfunction: Secondary | ICD-10-CM | POA: Diagnosis not present

## 2024-03-09 DIAGNOSIS — R570 Cardiogenic shock: Secondary | ICD-10-CM | POA: Diagnosis not present

## 2024-03-09 DIAGNOSIS — I110 Hypertensive heart disease with heart failure: Secondary | ICD-10-CM | POA: Diagnosis present

## 2024-03-09 DIAGNOSIS — R531 Weakness: Secondary | ICD-10-CM | POA: Diagnosis not present

## 2024-03-09 DIAGNOSIS — R627 Adult failure to thrive: Secondary | ICD-10-CM | POA: Diagnosis present

## 2024-03-09 DIAGNOSIS — N179 Acute kidney failure, unspecified: Principal | ICD-10-CM | POA: Diagnosis present

## 2024-03-09 DIAGNOSIS — E43 Unspecified severe protein-calorie malnutrition: Secondary | ICD-10-CM | POA: Diagnosis present

## 2024-03-09 DIAGNOSIS — E8721 Acute metabolic acidosis: Secondary | ICD-10-CM | POA: Diagnosis not present

## 2024-03-09 DIAGNOSIS — Z66 Do not resuscitate: Secondary | ICD-10-CM | POA: Diagnosis not present

## 2024-03-09 DIAGNOSIS — Z8249 Family history of ischemic heart disease and other diseases of the circulatory system: Secondary | ICD-10-CM

## 2024-03-09 DIAGNOSIS — E876 Hypokalemia: Secondary | ICD-10-CM | POA: Diagnosis present

## 2024-03-09 DIAGNOSIS — N1330 Unspecified hydronephrosis: Secondary | ICD-10-CM | POA: Diagnosis present

## 2024-03-09 DIAGNOSIS — I2510 Atherosclerotic heart disease of native coronary artery without angina pectoris: Secondary | ICD-10-CM | POA: Diagnosis present

## 2024-03-09 DIAGNOSIS — C541 Malignant neoplasm of endometrium: Secondary | ICD-10-CM | POA: Diagnosis present

## 2024-03-09 DIAGNOSIS — R188 Other ascites: Secondary | ICD-10-CM | POA: Diagnosis present

## 2024-03-09 DIAGNOSIS — I252 Old myocardial infarction: Secondary | ICD-10-CM

## 2024-03-09 DIAGNOSIS — I255 Ischemic cardiomyopathy: Secondary | ICD-10-CM | POA: Diagnosis present

## 2024-03-09 DIAGNOSIS — D6959 Other secondary thrombocytopenia: Secondary | ICD-10-CM | POA: Diagnosis not present

## 2024-03-09 DIAGNOSIS — Z800 Family history of malignant neoplasm of digestive organs: Secondary | ICD-10-CM

## 2024-03-09 DIAGNOSIS — K3189 Other diseases of stomach and duodenum: Secondary | ICD-10-CM | POA: Diagnosis present

## 2024-03-09 DIAGNOSIS — I5033 Acute on chronic diastolic (congestive) heart failure: Secondary | ICD-10-CM | POA: Diagnosis not present

## 2024-03-09 DIAGNOSIS — E222 Syndrome of inappropriate secretion of antidiuretic hormone: Secondary | ICD-10-CM | POA: Diagnosis present

## 2024-03-09 DIAGNOSIS — Z6836 Body mass index (BMI) 36.0-36.9, adult: Secondary | ICD-10-CM

## 2024-03-09 DIAGNOSIS — K219 Gastro-esophageal reflux disease without esophagitis: Secondary | ICD-10-CM | POA: Diagnosis present

## 2024-03-09 DIAGNOSIS — R571 Hypovolemic shock: Secondary | ICD-10-CM | POA: Diagnosis not present

## 2024-03-09 DIAGNOSIS — R6510 Systemic inflammatory response syndrome (SIRS) of non-infectious origin without acute organ dysfunction: Secondary | ICD-10-CM | POA: Diagnosis not present

## 2024-03-09 DIAGNOSIS — E66812 Obesity, class 2: Secondary | ICD-10-CM | POA: Diagnosis present

## 2024-03-09 DIAGNOSIS — Z6834 Body mass index (BMI) 34.0-34.9, adult: Secondary | ICD-10-CM

## 2024-03-09 DIAGNOSIS — C7889 Secondary malignant neoplasm of other digestive organs: Secondary | ICD-10-CM | POA: Diagnosis present

## 2024-03-09 DIAGNOSIS — Z515 Encounter for palliative care: Secondary | ICD-10-CM

## 2024-03-09 DIAGNOSIS — J9601 Acute respiratory failure with hypoxia: Secondary | ICD-10-CM | POA: Diagnosis not present

## 2024-03-09 DIAGNOSIS — Z9221 Personal history of antineoplastic chemotherapy: Secondary | ICD-10-CM

## 2024-03-09 DIAGNOSIS — K311 Adult hypertrophic pyloric stenosis: Secondary | ICD-10-CM | POA: Diagnosis present

## 2024-03-09 DIAGNOSIS — E039 Hypothyroidism, unspecified: Secondary | ICD-10-CM | POA: Diagnosis present

## 2024-03-09 DIAGNOSIS — R29898 Other symptoms and signs involving the musculoskeletal system: Secondary | ICD-10-CM | POA: Diagnosis present

## 2024-03-09 DIAGNOSIS — D72829 Elevated white blood cell count, unspecified: Secondary | ICD-10-CM | POA: Diagnosis present

## 2024-03-09 LAB — URINALYSIS, COMPLETE (UACMP) WITH MICROSCOPIC
Bacteria, UA: NONE SEEN
Bilirubin Urine: NEGATIVE
Glucose, UA: NEGATIVE mg/dL
Hgb urine dipstick: NEGATIVE
Ketones, ur: NEGATIVE mg/dL
Nitrite: NEGATIVE
Protein, ur: NEGATIVE mg/dL
Specific Gravity, Urine: 1.016 (ref 1.005–1.030)
pH: 5 (ref 5.0–8.0)

## 2024-03-09 LAB — COMPREHENSIVE METABOLIC PANEL WITH GFR
ALT: 8 U/L (ref 0–44)
AST: 43 U/L — ABNORMAL HIGH (ref 15–41)
Albumin: 2.3 g/dL — ABNORMAL LOW (ref 3.5–5.0)
Alkaline Phosphatase: 90 U/L (ref 38–126)
Anion gap: 19 — ABNORMAL HIGH (ref 5–15)
BUN: 117 mg/dL — ABNORMAL HIGH (ref 6–20)
CO2: 15 mmol/L — ABNORMAL LOW (ref 22–32)
Calcium: 8.8 mg/dL — ABNORMAL LOW (ref 8.9–10.3)
Chloride: 100 mmol/L (ref 98–111)
Creatinine, Ser: 2.33 mg/dL — ABNORMAL HIGH (ref 0.44–1.00)
GFR, Estimated: 24 mL/min — ABNORMAL LOW (ref >60)
Glucose, Bld: 264 mg/dL — ABNORMAL HIGH (ref 70–99)
Potassium: 5.6 mmol/L — ABNORMAL HIGH (ref 3.5–5.1)
Sodium: 134 mmol/L — ABNORMAL LOW (ref 135–145)
Total Bilirubin: 0.5 mg/dL (ref 0.0–1.2)
Total Protein: 6 g/dL — ABNORMAL LOW (ref 6.5–8.1)

## 2024-03-09 LAB — CBC
HCT: 31.6 % — ABNORMAL LOW (ref 36.0–46.0)
Hemoglobin: 10.5 g/dL — ABNORMAL LOW (ref 12.0–15.0)
MCH: 34.3 pg — ABNORMAL HIGH (ref 26.0–34.0)
MCHC: 33.2 g/dL (ref 30.0–36.0)
MCV: 103.3 fL — ABNORMAL HIGH (ref 80.0–100.0)
Platelets: 201 K/uL (ref 150–400)
RBC: 3.06 MIL/uL — ABNORMAL LOW (ref 3.87–5.11)
RDW: 17.8 % — ABNORMAL HIGH (ref 11.5–15.5)
WBC: 24.3 K/uL — ABNORMAL HIGH (ref 4.0–10.5)
nRBC: 0 % (ref 0.0–0.2)

## 2024-03-09 LAB — GLUCOSE, CAPILLARY: Glucose-Capillary: 193 mg/dL — ABNORMAL HIGH (ref 70–99)

## 2024-03-09 LAB — CK: Total CK: 25 U/L — ABNORMAL LOW (ref 38–234)

## 2024-03-09 MED ORDER — ACETAMINOPHEN 325 MG PO TABS
650.0000 mg | ORAL_TABLET | Freq: Four times a day (QID) | ORAL | Status: DC | PRN
Start: 2024-03-09 — End: 2024-03-17
  Filled 2024-03-09 (×2): qty 2

## 2024-03-09 MED ORDER — SODIUM CHLORIDE 0.9% FLUSH
10.0000 mL | Freq: Two times a day (BID) | INTRAVENOUS | Status: DC
  Administered 2024-03-10 – 2024-03-14 (×7): 10 mL

## 2024-03-09 MED ORDER — ACETAMINOPHEN 10 MG/ML IV SOLN
1000.0000 mg | Freq: Once | INTRAVENOUS | Status: AC
  Administered 2024-03-09: 1000 mg via INTRAVENOUS
  Filled 2024-03-09: qty 100

## 2024-03-09 MED ORDER — TRACE MINERALS CU-MN-SE-ZN 300-55-60-3000 MCG/ML IV SOLN: INTRAVENOUS | Status: DC

## 2024-03-09 MED ORDER — ONDANSETRON HCL 4 MG PO TABS
4.0000 mg | ORAL_TABLET | Freq: Four times a day (QID) | ORAL | Status: DC | PRN
Start: 2024-03-09 — End: 2024-03-17

## 2024-03-09 MED ORDER — PANTOPRAZOLE SODIUM 40 MG IV SOLR
40.0000 mg | Freq: Every day | INTRAVENOUS | Status: DC
  Administered 2024-03-10 – 2024-03-17 (×8): 40 mg via INTRAVENOUS
  Filled 2024-03-09 (×8): qty 10

## 2024-03-09 MED ORDER — INSULIN ASPART 100 UNIT/ML IJ SOLN
0.0000 [IU] | Freq: Four times a day (QID) | INTRAMUSCULAR | Status: DC
  Administered 2024-03-09 – 2024-03-11 (×2): 4 [IU] via SUBCUTANEOUS
  Administered 2024-03-11 – 2024-03-12 (×6): 3 [IU] via SUBCUTANEOUS
  Administered 2024-03-12: 4 [IU] via SUBCUTANEOUS
  Administered 2024-03-13: 3 [IU] via SUBCUTANEOUS
  Administered 2024-03-13: 4 [IU] via SUBCUTANEOUS
  Administered 2024-03-13 – 2024-03-14 (×3): 3 [IU] via SUBCUTANEOUS
  Administered 2024-03-14 – 2024-03-15 (×2): 4 [IU] via SUBCUTANEOUS
  Administered 2024-03-15: 3 [IU] via SUBCUTANEOUS
  Administered 2024-03-15: 4 [IU] via SUBCUTANEOUS
  Administered 2024-03-16 (×2): 3 [IU] via SUBCUTANEOUS
  Administered 2024-03-17: 4 [IU] via SUBCUTANEOUS
  Administered 2024-03-17: 3 [IU] via SUBCUTANEOUS
  Filled 2024-03-09 (×23): qty 1

## 2024-03-09 MED ORDER — CHLORHEXIDINE GLUCONATE CLOTH 2 % EX PADS
6.0000 | MEDICATED_PAD | Freq: Every day | CUTANEOUS | Status: DC
  Administered 2024-03-10 – 2024-03-16 (×7): 6 via TOPICAL

## 2024-03-09 MED ORDER — SODIUM ZIRCONIUM CYCLOSILICATE 10 G PO PACK
10.0000 g | PACK | Freq: Once | ORAL | Status: AC
  Administered 2024-03-09: 10 g via ORAL
  Filled 2024-03-09: qty 1

## 2024-03-09 MED ORDER — ONDANSETRON HCL 4 MG/2ML IJ SOLN: 4.0000 mg | Freq: Four times a day (QID) | INTRAMUSCULAR | Status: DC | PRN

## 2024-03-09 MED ORDER — ENOXAPARIN SODIUM 30 MG/0.3ML IJ SOSY
30.0000 mg | PREFILLED_SYRINGE | INTRAMUSCULAR | Status: DC
  Administered 2024-03-09 – 2024-03-15 (×7): 30 mg via SUBCUTANEOUS
  Filled 2024-03-09 (×7): qty 0.3

## 2024-03-09 MED ORDER — BISACODYL 10 MG RE SUPP
10.0000 mg | Freq: Every day | RECTAL | Status: DC | PRN
  Administered 2024-03-11: 10 mg via RECTAL
  Filled 2024-03-09: qty 1

## 2024-03-09 MED ORDER — LEVOTHYROXINE SODIUM 100 MCG/5ML IV SOLN
200.0000 ug | Freq: Every morning | INTRAVENOUS | Status: DC
  Administered 2024-03-10 – 2024-03-17 (×8): 200 ug via INTRAVENOUS
  Filled 2024-03-09 (×8): qty 10

## 2024-03-09 MED ORDER — TRACE MINERALS CU-MN-SE-ZN 300-55-60-3000 MCG/ML IV SOLN
INTRAVENOUS | Status: AC
  Filled 2024-03-09: qty 340.8

## 2024-03-09 MED ORDER — SODIUM CHLORIDE 0.9 % IV BOLUS
1000.0000 mL | Freq: Once | INTRAVENOUS | Status: AC
  Administered 2024-03-09: 1000 mL via INTRAVENOUS

## 2024-03-09 MED ORDER — SODIUM CHLORIDE 0.9% FLUSH: 10.0000 mL | INTRAVENOUS | Status: DC | PRN

## 2024-03-09 MED ORDER — SODIUM CHLORIDE 0.9 % IV SOLN: INTRAVENOUS | Status: DC

## 2024-03-09 MED ORDER — ACETAMINOPHEN 650 MG RE SUPP
650.0000 mg | Freq: Four times a day (QID) | RECTAL | Status: DC | PRN
Start: 2024-03-09 — End: 2024-03-17
  Filled 2024-03-09 (×2): qty 1

## 2024-03-09 NOTE — ED Provider Notes (Signed)
 [facility] Provider Note    Event Date/Time   First MD Initiated Contact with Patient 03/09/24 [phone]     (approximate)  History   Chief Complaint: Weakness  HPI  Tonya [name] is a 58 y.o. female with a past medical history of metastatic endometrial cancer, CAD, gastric reflux, hypertension, presents to the emergency department for generalized weakness and fall.  According to the patient she was discharged from the hospital yesterday since going home she has been feeling very weak has been having trouble getting around even with the help of her husband had a fall last night due to generalized weakness.  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  Patient had malnutrition was started on TPN via a PICC line and had a PEG tube placed which she has not started using as of yet.  Patient believes she was discharged too early and did not have her strength back before going home.  Physical Exam   Triage Vital Signs: ED Triage Vitals  Encounter Vitals Group     BP 03/09/24 0834 112/79     Girls Systolic BP Percentile --      Girls Diastolic BP Percentile --      Boys Systolic BP Percentile --      Boys Diastolic BP Percentile --      Pulse Rate 03/09/24 0834 96     Resp --      Temp 03/09/24 0834 (!) 97.5 F (36.4 C)     Temp src --      SpO2 03/09/24 0834 100 %     Weight 03/09/24 0832 187 lb 13.3 oz (85.2 kg)     Height 03/09/24 0832 5' 4 (1.626 m)     Head Circumference --      Peak Flow --      Pain Score --      Pain Loc --      Pain Education --      Exclude from Growth Chart --     Most recent vital signs: Vitals:   03/09/24 0834  BP: 112/79  Pulse: 96  Temp: (!) 97.5 F (36.4 C)  SpO2: 100%    General: Awake, no distress.  CV:  Good peripheral perfusion.  Regular rate and rhythm  Resp:  Normal effort.  Equal breath sounds bilaterally.  Abd:  No  distention.  Soft, nontender.  No rebound or guarding. Other:  Right upper extremity PICC line present, PEG tube present, indwelling Foley catheter.   ED Results / Procedures / Treatments   MEDICATIONS ORDERED IN ED: Medications  sodium chloride  0.9 % bolus 1,000 mL (has no administration in time range)     IMPRESSION / MDM / ASSESSMENT AND PLAN / ED COURSE  I reviewed the triage vital signs and the nursing notes.  Patient's presentation is most consistent with acute presentation with potential threat to life or bodily function.  Patient presents to the emergency department for generalized weakness and a fall last night.  Denies hitting her head.  States she slid out of her chair and was unable to get herself up had to call EMS.  Patient has a history of endometrial carcinoma, on TPN for malnutrition, had a PEG tube placed during her recent hospitalization but they have not started using.  Given the patient's generalized weakness and fall we will check labs will IV hydrate we will continue to closely  monitor.  Patient does not appear to be able to care for self at home in her current state even with her husband's help.  Patient will likely require readmission to the hospital versus rehab placement.  Patient's lab work today shows an AKI with a creatinine of 2.3 up from 1.4.  Patient has mild hyperkalemia at 5.6.  Hyperglycemia 264 (although the patient was receiving TPN).  Anion gap elevated at 19 likely consistent with dehydration.  Patient has significant leukocytosis at 24,000 however this is largely unchanged from recent historical values.  Patient's urinalysis shows no concerning finding.  Given the patient's continued weakness fatigue inability to care for self at home due to significant weakness with an AKI today and hyperkalemia we will admit to the hospitalist service for further workup and treatment with continued hydration.  Patient will likely require rehab placement for increased  home health care upon discharge.  FINAL CLINICAL IMPRESSION(S) / ED DIAGNOSES   Weakness Acute kidney injury Hyperkalemia  Note:  This document was prepared using Dragon voice recognition software and may include unintentional dictation errors.   Dorothyann Drivers, MD 03/09/24 1229

## 2024-03-09 NOTE — ED Triage Notes (Signed)
 Pt BIB EMS for weakness and fall last night.  Pt arrives with picc in right arm and peg tube to drainage bag.

## 2024-03-09 NOTE — Consult Note (Signed)
 PHARMACY - TOTAL PARENTERAL NUTRITION CONSULT NOTE   Indication: Small bowel obstruction  Patient Measurements: Height: 5' 4 (162.6 cm) Weight: 85.2 kg (187 lb 13.3 oz) IBW/kg (Calculated) : 54.7 TPN AdjBW (KG): 62.3 Body mass index is 32.24 kg/m. Usual Weight: N/A  Assessment:  58 y.o. 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 Patient discharged yesterday with plans to follow up with oncology, with new chemotherapy regimen set to initiate 7/22. Patient now back today after weakness with fall at home, now with AKI.  Glucose / Insulin : no history of DM Dexamethasone  taper, 10 mg decreased to 6 mg with most recent dose 7/17 Insulin  requirements the last 24h: unknown due to discharge yesterday.  Electrolytes: K 5.6, Lokelma x 1 ordered Renal: Scr 2.33, up from 1.65 yesterday Hepatic: AST slightly elevated at 43 Intake / Output: History of heart failure mIVF NS @ 100 cc/hr  GI Imaging: US  renal 7/17: Mild left hydronephrosis, unchanged from 7/9  CT abdomen/pelvis 7/11:  1. Interval marked fluid distension of the stomach with irregular wall thickening of the antral pyloric region with mucosal enhancement with otherwise decompressed appearance of the bowel, outlet obstruction or functional obstruction of the stomach cannot be excluded. NG decompression is recommended 2. Slight increased ascites within the abdomen and pelvis, now small-moderate volume. No free air. 3. New mild left-sided hydronephrosis and hydroureter, with more normal appearing caliber of distal left ureter in the pelvis, anterior to the iliac vessels. No obstructing stone. Findings are consistent with distal ureteral obstruction. Decreased excretion of contrast from the left kidney on delayed views. 4. Nodular soft tissue thickening at the right posterior  bladder, stable in the short interim but suspicious for neoplasm/metastatic disease. 5. Extensive abdominal, retroperitoneal and pelvic adenopathy consistent with metastatic disease. 6. 6 mm right lower lobe pulmonary nodule, stable, and also concerning for metastatic disease. 7. Aortic atherosclerosis.  GI Surgeries / Procedures:  EGD done 7/10 7/14: PERCUTANEOUS (VENTING) G-TUBE PLACEMENT (3600 ml of fluid removed)  Central access: 7/11 TPN start date: 7/11  Nutritional Goals: Goal TPN rate 60 mL/hr (provides 102 g of protein and 1906 kcals per day)  7/15: Trying to keep fluid low due to HFpEF and ascites. Provider wants fluid increase due to bump in Scr 7/16: Dr Caleen and Augustin (clinical dietitian) agreed on keeping TPN rate at 73ml/hr.  RD Assessment: Consulted. Most recent note 7/16 - patient remains at high refeed risk.  Current Nutrition: NPO  Plan:  Continue TPN rate at 30 mL/hr  Electrolytes in TPN: Na 50mEq/L, K 5mEq/L (decreased 7/18), Ca 4mEq/L, Mg 71mEq/L (added back 7/17), and Phos 12mmol/L(increased on 7/13) Cl:Ac 1:2 Add standard MVI and trace elements to TPN. Continue Resistant q6h SSI and insulin  10 units of regular insulin  on TPN bag. Will adjust as dexamethasone  taper adjusted. Next TG check scheduled with TPN labs Monitor TPN labs on Mon/Thurs   Will M. Lenon, PharmD Clinical Pharmacist 03/09/2024 3:10 PM

## 2024-03-09 NOTE — ED Notes (Signed)
 Fall precautions in place for Pt. This RN placed fall band, fall grip socks, bed alarm and fall sign.

## 2024-03-09 NOTE — H&P (Signed)
 History and Physical    Tonya [name] FMW:[id number] DOB: 25-Feb-1966 DOA: 03/09/2024  DOS: the patient was seen and examined on 03/09/2024  PCP: Cleotilde Oneil FALCON, MD   Patient coming from: Home  I have personally briefly reviewed patient's old medical records in [facility]  Chief Complaint: Generalized weakness, unable to get up  HPI: Tonya [name] is a pleasant 58 y.o. female with medical history significant for metastatic stage IV endometrial cancer, CAD, gastric reflux, hypertension, gastric outlet obstruction s/p PEG tube who was recently admitted to [facility] and discharged between 02/29/2024 and 03/08/2024 after being treated for intractable nausea vomiting due to gastric mass s/p PEG tube.  Patient was discharged home on PICC line, TPN and PEG tube. Patient is stated that she was discharged from the hospital yesterday, since going home she has been feeling very weak, having trouble getting around even with the help of her husband had a fall last night due to generalized weakness.  Patient stated that she was discharged too early before she was getting back to herself normal.  She denies any fever, nausea vomiting, abdominal pain, fever, hematemesis, melena.  ED Course: Upon arrival to the ED, patient is found to have severe deconditioning, hyperkalemia 5.6, severe dehydration/AKI with BUN 117 and creatinine 2.33 and leukocytosis at 24.3.  Hospitalist service was consulted for evaluation for admission for deconditioning, hyperkalemia and dehydration.  Of note she had persistent leukocytosis prior to discharge home and it was thought to be reactive.  Review of Systems:  ROS  All other systems negative except as noted in the HPI.  Past Medical History:  Diagnosis Date   Cancer (HCC)    Coronary artery disease    GERD (gastroesophageal reflux disease)    Hypertension    Hypothyroidism    Myocardial infarction (HCC)    Thyroid  disease     Past Surgical History:   Procedure Laterality Date   CARDIAC CATHETERIZATION     CESAREAN SECTION     CORONARY STENT INTERVENTION N/A 07/24/2019   Procedure: CORONARY STENT INTERVENTION;  Surgeon: Mady Bruckner, MD;  Location: [facility] INVASIVE CV LAB;  Service: Cardiovascular;  Laterality: N/A;  RCA & CFX   ESOPHAGOGASTRODUODENOSCOPY N/A 03/01/2024   Procedure: EGD (ESOPHAGOGASTRODUODENOSCOPY);  Surgeon: Jinny Carmine, MD;  Location[facility] ENDOSCOPY;  Service: Endoscopy;  Laterality: N/A;   IR GASTROSTOMY TUBE MOD SED  03/05/2024   IR IMAGING GUIDED PORT INSERTION  09/23/2023   IR PARACENTESIS  03/05/2024   LEFT HEART CATH AND CORONARY ANGIOGRAPHY N/A 07/24/2019   Procedure: LEFT HEART CATH AND CORONARY ANGIOGRAPHY;  Surgeon: Hester Wolm PARAS, MD;  Location: [facility] INVASIVE CV LAB;  Service: Cardiovascular;  Laterality: N/A;   TONSILLECTOMY       reports that she has never smoked. She has never used smokeless tobacco. She reports that she does not currently use alcohol. She reports that she does not use drugs.  No Known Allergies  Family History  Problem Relation Age of Onset   Atrial fibrillation Mother        Passed January 2024.   Colon cancer Mother        Diagnosed 2007   Heart Problems Father    Heart attack Father    Heart disease Father     Prior to Admission medications   Medication Sig Start Date End Date Taking? Authorizing Provider  bisacodyl  (DULCOLAX) 10 MG suppository Place 1 suppository (10 mg total) rectally daily as needed for moderate constipation. 03/08/24  Caleen Qualia, MD  levothyroxine  (SYNTHROID , LEVOTHROID) 100 MCG/5ML SOLN injection Inject 10 mLs (200 mcg total) into the vein in the morning. 03/09/24   Caleen Qualia, MD  ondansetron  (ZOFRAN ) 4 MG/2ML SOLN injection Inject 2 mLs (4 mg total) into the vein every 6 (six) hours as needed for nausea or vomiting. 03/08/24   Caleen Qualia, MD  pantoprazole  (PROTONIX ) 40 MG injection Inject 40 mg into the vein daily. 03/08/24   Caleen Qualia, MD     Physical Exam: Vitals:   03/09/24 0900 03/09/24 0930 03/09/24 1105 03/09/24 1200  BP: 115/80 118/74 123/73 125/84  Pulse: 95 95 94 92  Resp: 18 20 17 18   Temp:      SpO2: 99% 99% 99% 98%  Weight:      Height:        Physical Exam   Constitutional: Alert, awake, calm, comfortable HEENT: Neck supple Respiratory: Clear to auscultation B/L, no wheezing, no rales.  Cardiovascular: Regular rate and rhythm, no murmurs / rubs / gallops. No extremity edema. 2+ pedal pulses. No carotid bruits.  Abdomen: Soft, no tenderness, Bowel sounds positive.  Musculoskeletal: no clubbing / cyanosis. Good ROM, no contractures. Normal muscle tone.  Skin: no rashes, lesions, ulcers. Neurologic: CN 2-12 grossly intact. Sensation intact, No focal deficit identified Psychiatric: Alert and oriented x 3. Normal mood.    Labs on Admission: I have personally reviewed following labs and imaging studies  CBC: Recent Labs  Lab 03/05/24 0610 03/06/24 0930 03/07/24 0519 03/08/24 0520 03/09/24 0948  WBC 22.9* 23.9* 24.2* 27.5* 24.3*  HGB 11.0* 11.5* 10.9* 10.8* 10.5*  HCT 33.0* 34.1* 31.8* 32.1* 31.6*  MCV 98.5 99.7 99.1 99.4 103.3*  PLT 268 227 200 204 201   Basic Metabolic Panel: Recent Labs  Lab 03/03/24 0551 03/04/24 0635 03/05/24 0610 03/06/24 0930 03/07/24 0519 03/08/24 0520 03/09/24 1119  NA 134* 133* 136 138 136 137 134*  K 3.7 3.7 4.2 5.0 4.7 4.9 5.6*  CL 91* 92* 98 101 102 103 100  CO2 33* 30 27 26 25 24  15*  GLUCOSE 192* 212* 168* 170* 162* 77 264*  BUN 62* 66* 75* 86* 91* 94* 117*  CREATININE 1.26* 1.25* 1.22* 1.49* 1.50* 1.65* 2.33*  CALCIUM  8.8* 9.2 9.1 9.2 9.1 9.0 8.8*  MG 2.6* 2.6* 2.2  --   --  1.8  --   PHOS 2.2* 1.4* 2.6  --   --  3.7  --    GFR: Estimated Creatinine Clearance: 27.5 mL/min (A) (by C-G formula based on SCr of 2.33 mg/dL (H)). Liver Function Tests: Recent Labs  Lab 03/05/24 0610 03/08/24 0520 03/09/24 1119  AST 31 43* 43*  ALT 8 8 8   ALKPHOS 56  78 90  BILITOT 0.4 0.6 0.5  PROT 5.8* 5.7* 6.0*  ALBUMIN  2.5* 2.2* 2.3*   No results for input(s): LIPASE, AMYLASE in the last 168 hours. No results for input(s): AMMONIA in the last 168 hours. Coagulation Profile: Recent Labs  Lab 03/05/24 0610  INR 1.0   Cardiac Enzymes: Recent Labs  Lab 03/09/24 1119  CKTOTAL 25*   BNP (last 3 results) Recent Labs    10/10/23 1353  BNP 308.7*   HbA1C: No results for input(s): HGBA1C in the last 72 hours. CBG: Recent Labs  Lab 03/07/24 1945 03/08/24 0003 03/08/24 0339 03/08/24 0739 03/08/24 1328  GLUCAP 181* 134* 120* 136* 118*   Lipid Profile: Recent Labs    03/07/24 0519  TRIG 184*   Thyroid  Function  Tests: No results for input(s): TSH, T4TOTAL, FREET4, T3FREE, THYROIDAB in the last 72 hours. Anemia Panel: No results for input(s): VITAMINB12, FOLATE, FERRITIN, TIBC, IRON, RETICCTPCT in the last 72 hours. Urine analysis:    Component Value Date/Time   COLORURINE YELLOW (A) 03/09/2024 0948   APPEARANCEUR CLOUDY (A) 03/09/2024 0948   LABSPEC 1.016 03/09/2024 0948   PHURINE 5.0 03/09/2024 0948   GLUCOSEU NEGATIVE 03/09/2024 0948   HGBUR NEGATIVE 03/09/2024 0948   BILIRUBINUR NEGATIVE 03/09/2024 0948   KETONESUR NEGATIVE 03/09/2024 0948   PROTEINUR NEGATIVE 03/09/2024 0948   NITRITE NEGATIVE 03/09/2024 0948   LEUKOCYTESUR MODERATE (A) 03/09/2024 0948    Radiological Exams on Admission: I have personally reviewed images US  RENAL Result Date: 03/08/2024 CLINICAL DATA:  Acute kidney injury EXAM: RENAL / URINARY TRACT ULTRASOUND COMPLETE COMPARISON:  CT abdomen and pelvis dated 02/29/2024 FINDINGS: Right Kidney: Length = 11.9 cm AP renal pelvis diameter = <10 mm Normal parenchymal echogenicity with preserved corticomedullary differentiation. No urinary tract dilation or shadowing calculi. The ureter is not seen. Left Kidney: Length = 12.5 cm AP renal pelvis diameter = <10 mm Normal parenchymal  echogenicity with preserved corticomedullary differentiation. Mild urinary tract dilation, similar to 02/29/2024. The ureter is not seen. Bladder: Irregular soft tissue density along the posterior right bladder measures 1.6 x 1.4 x 0.9 cm, previously 3.5 x 1.6 cm. Other: None. IMPRESSION: 1. Similar mild left hydronephrosis. 2. Decreased size of irregular soft tissue density along the posterior right bladder. Electronically Signed   By: Limin  Xu M.D.   On: 03/08/2024 16:10    EKG: My personal interpretation of EKG shows: Sinus rhythm at 97 bpm    Assessment/Plan Principal Problem:   Dehydration Active Problems:   Gastric mass   Endometrial carcinoma (HCC)   Hypothyroidism   Ischemic cardiomyopathy   Muscular deconditioning   Acute hyperkalemia    Assessment and Plan:  58 year old female W/PMH of stage IV metastatic endometrial cancer s/p chemotherapy, SIADH, hypothyroidism, HTN, chronic HFpEF who presented to [facility] for generalized weakness after she was discharged from the hospital yesterday.  1.  Generalized weakness/deconditioning - She has multiple medical problems including but not limited to stage IV endometrial cancer s/p chemo - She has a leukocytosis, AKI, she was recently discharged from the hospital - She does not have fever and no signs of acute sepsis at this point. - Will give her physical and Occupational Therapy and see if she is able to go home or need to go to rehab. - Case manager to work with patient if she can go to rehab if physical therapy and Occupational Therapy recommends  2 . Hyperkalemia: - May be related to dehydration, she is also on TPN - There is no EKG changes - Will continue to monitor  3.  Metastatic endometrial cancer with gastric outlet obstruction s/p PEG tube - Patient is on TPN and I do not believe the PEG tube feed was started - Patient to sip water  or ice but  no food by mouth - Continue TPN - Manage as per  oncology at outpatient  4.  Malnutrition due to unable to eat due to gastric outlet obstruction - Continue TPN for nutrition and dietitian recommendations  5.  AKI - It appears that it got worse since she was discharged - Will give her gentle hydration until tomorrow and see how she responds  6.  Hypothyroidism - She will be given thyroxine IV  7.  HFpEF - Does  not appear to be fluid overloaded - Will continue to monitor  8.  GERD - Patient was given Protonix  IV which will be continued    DVT prophylaxis: Lovenox  Code Status: Full Code Family Communication: Not available but she wants her niece to make decision Disposition Plan: Home versus rehab Consults called: None Admission status: Observation Med-Surg   Nena Rebel, MD [facility] 03/09/2024, 2:16 PM

## 2024-03-10 DIAGNOSIS — R6510 Systemic inflammatory response syndrome (SIRS) of non-infectious origin without acute organ dysfunction: Secondary | ICD-10-CM | POA: Diagnosis not present

## 2024-03-10 DIAGNOSIS — N1330 Unspecified hydronephrosis: Secondary | ICD-10-CM | POA: Diagnosis present

## 2024-03-10 DIAGNOSIS — E860 Dehydration: Secondary | ICD-10-CM | POA: Diagnosis present

## 2024-03-10 DIAGNOSIS — Z66 Do not resuscitate: Secondary | ICD-10-CM | POA: Diagnosis not present

## 2024-03-10 DIAGNOSIS — Z6834 Body mass index (BMI) 34.0-34.9, adult: Secondary | ICD-10-CM | POA: Diagnosis not present

## 2024-03-10 DIAGNOSIS — R579 Shock, unspecified: Secondary | ICD-10-CM | POA: Diagnosis not present

## 2024-03-10 DIAGNOSIS — Z515 Encounter for palliative care: Secondary | ICD-10-CM | POA: Diagnosis not present

## 2024-03-10 DIAGNOSIS — K311 Adult hypertrophic pyloric stenosis: Secondary | ICD-10-CM | POA: Diagnosis present

## 2024-03-10 DIAGNOSIS — N179 Acute kidney failure, unspecified: Secondary | ICD-10-CM | POA: Diagnosis present

## 2024-03-10 DIAGNOSIS — R188 Other ascites: Secondary | ICD-10-CM | POA: Diagnosis present

## 2024-03-10 DIAGNOSIS — R570 Cardiogenic shock: Secondary | ICD-10-CM | POA: Diagnosis not present

## 2024-03-10 DIAGNOSIS — J90 Pleural effusion, not elsewhere classified: Secondary | ICD-10-CM | POA: Diagnosis not present

## 2024-03-10 DIAGNOSIS — K3189 Other diseases of stomach and duodenum: Secondary | ICD-10-CM | POA: Diagnosis not present

## 2024-03-10 DIAGNOSIS — E222 Syndrome of inappropriate secretion of antidiuretic hormone: Secondary | ICD-10-CM | POA: Diagnosis present

## 2024-03-10 DIAGNOSIS — I5033 Acute on chronic diastolic (congestive) heart failure: Secondary | ICD-10-CM | POA: Diagnosis not present

## 2024-03-10 DIAGNOSIS — I110 Hypertensive heart disease with heart failure: Secondary | ICD-10-CM | POA: Diagnosis present

## 2024-03-10 DIAGNOSIS — E039 Hypothyroidism, unspecified: Secondary | ICD-10-CM | POA: Diagnosis present

## 2024-03-10 DIAGNOSIS — E8721 Acute metabolic acidosis: Secondary | ICD-10-CM | POA: Diagnosis not present

## 2024-03-10 DIAGNOSIS — R531 Weakness: Secondary | ICD-10-CM | POA: Diagnosis present

## 2024-03-10 DIAGNOSIS — R571 Hypovolemic shock: Secondary | ICD-10-CM | POA: Diagnosis not present

## 2024-03-10 DIAGNOSIS — E875 Hyperkalemia: Secondary | ICD-10-CM | POA: Diagnosis present

## 2024-03-10 DIAGNOSIS — K9423 Gastrostomy malfunction: Secondary | ICD-10-CM | POA: Diagnosis not present

## 2024-03-10 DIAGNOSIS — C7889 Secondary malignant neoplasm of other digestive organs: Secondary | ICD-10-CM | POA: Diagnosis present

## 2024-03-10 DIAGNOSIS — E876 Hypokalemia: Secondary | ICD-10-CM | POA: Diagnosis present

## 2024-03-10 DIAGNOSIS — C541 Malignant neoplasm of endometrium: Secondary | ICD-10-CM | POA: Diagnosis present

## 2024-03-10 DIAGNOSIS — J9601 Acute respiratory failure with hypoxia: Secondary | ICD-10-CM | POA: Diagnosis not present

## 2024-03-10 DIAGNOSIS — D6959 Other secondary thrombocytopenia: Secondary | ICD-10-CM | POA: Diagnosis not present

## 2024-03-10 DIAGNOSIS — I255 Ischemic cardiomyopathy: Secondary | ICD-10-CM | POA: Diagnosis not present

## 2024-03-10 DIAGNOSIS — R29898 Other symptoms and signs involving the musculoskeletal system: Secondary | ICD-10-CM | POA: Diagnosis not present

## 2024-03-10 DIAGNOSIS — E43 Unspecified severe protein-calorie malnutrition: Secondary | ICD-10-CM | POA: Diagnosis present

## 2024-03-10 LAB — COMPREHENSIVE METABOLIC PANEL WITH GFR
ALT: 10 U/L (ref 0–44)
AST: 50 U/L — ABNORMAL HIGH (ref 15–41)
Albumin: 2 g/dL — ABNORMAL LOW (ref 3.5–5.0)
Alkaline Phosphatase: 90 U/L (ref 38–126)
Anion gap: 14 (ref 5–15)
BUN: 124 mg/dL — ABNORMAL HIGH (ref 6–20)
CO2: 20 mmol/L — ABNORMAL LOW (ref 22–32)
Calcium: 8.4 mg/dL — ABNORMAL LOW (ref 8.9–10.3)
Chloride: 102 mmol/L (ref 98–111)
Creatinine, Ser: 2.32 mg/dL — ABNORMAL HIGH (ref 0.44–1.00)
GFR, Estimated: 24 mL/min — ABNORMAL LOW (ref >60)
Glucose, Bld: 91 mg/dL (ref 70–99)
Potassium: 4.8 mmol/L (ref 3.5–5.1)
Sodium: 136 mmol/L (ref 135–145)
Total Bilirubin: 0.7 mg/dL (ref 0.0–1.2)
Total Protein: 5.5 g/dL — ABNORMAL LOW (ref 6.5–8.1)

## 2024-03-10 LAB — CBC
HCT: 29.6 % — ABNORMAL LOW (ref 36.0–46.0)
Hemoglobin: 10.3 g/dL — ABNORMAL LOW (ref 12.0–15.0)
MCH: 33.6 pg (ref 26.0–34.0)
MCHC: 34.8 g/dL (ref 30.0–36.0)
MCV: 96.4 fL (ref 80.0–100.0)
Platelets: 194 K/uL (ref 150–400)
RBC: 3.07 MIL/uL — ABNORMAL LOW (ref 3.87–5.11)
RDW: 17.3 % — ABNORMAL HIGH (ref 11.5–15.5)
WBC: 21 K/uL — ABNORMAL HIGH (ref 4.0–10.5)
nRBC: 0.1 % (ref 0.0–0.2)

## 2024-03-10 LAB — PROTIME-INR
INR: 1 (ref 0.8–1.2)
Prothrombin Time: 14 s (ref 11.4–15.2)

## 2024-03-10 LAB — GLUCOSE, CAPILLARY
Glucose-Capillary: 104 mg/dL — ABNORMAL HIGH (ref 70–99)
Glucose-Capillary: 141 mg/dL — ABNORMAL HIGH (ref 70–99)
Glucose-Capillary: 89 mg/dL (ref 70–99)
Glucose-Capillary: 91 mg/dL (ref 70–99)
Glucose-Capillary: 91 mg/dL (ref 70–99)
Glucose-Capillary: 97 mg/dL (ref 70–99)

## 2024-03-10 LAB — PHOSPHORUS: Phosphorus: 5 mg/dL — ABNORMAL HIGH (ref 2.5–4.6)

## 2024-03-10 MED ORDER — SODIUM CHLORIDE 0.9 % IV SOLN: INTRAVENOUS | Status: AC

## 2024-03-10 MED ORDER — TRACE MINERALS CU-MN-SE-ZN 300-55-60-3000 MCG/ML IV SOLN
INTRAVENOUS | Status: AC
  Filled 2024-03-10: qty 681.6

## 2024-03-10 MED ORDER — METHOCARBAMOL 1000 MG/10ML IJ SOLN
500.0000 mg | Freq: Once | INTRAMUSCULAR | Status: AC
  Administered 2024-03-10: 500 mg via INTRAVENOUS
  Filled 2024-03-10: qty 5

## 2024-03-10 NOTE — Evaluation (Signed)
 Occupational Therapy Evaluation Patient Details Name: Tonya [name] MRN: [id number] DOB: January 24, 1966 Today's Date: 03/10/2024   History of Present Illness   58 y/o female presented to ED on 03/09/24 for generalized weakness and fall. Recent hospitalization 7/9-7/17 for gastric mass s/p PEG tube. Admitted for dehydration. 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     Clinical Impressions Pt was seen for OT/PT evaluation this date. As stated above pt recently discharged on 7/17. Pt returned home post falling due to increased generalized weakness. Prior to recent hospital admission, pt was amb with RW, requiring assistance for all ADL/IADLs from her husband. Pt lives with her husband in a house with a ramped entrance. Pt presents to acute OT demonstrating impaired ADL performance, generalized weakness, limited activity tolerance and functional mobility 2/2 (See OT problem list for additional functional deficits). Pt currently requires MODA+2 for all bed mobility, seated rest breaks due to fatigue and dizziness with position changes. Seated BP:121/75(MAP85), HR85, notable labored breathing post bed mobility. Once recovered pt STS from the EOB for pericare MAXA. Pt took lateral steps up the [facility] with MODA +2 with use of RW. Pt retired in bed with all needs in reach and her husband at bedside. Pt would benefit from skilled OT services to address noted impairments and functional limitations (see below for any additional details) in order to maximize safety and independence while minimizing falls risk and caregiver burden. OT will follow acutely.     If plan is discharge home, recommend the following:   A lot of help with walking and/or transfers;A lot of help with bathing/dressing/bathroom;Assistance with cooking/housework;Assist for transportation;Help with stairs or ramp for entrance     Functional Status Assessment   Patient has  had a recent decline in their functional status and/or demonstrates limited ability to make significant improvements in function in a reasonable and predictable amount of time     Equipment Recommendations   Other (comment) (Defer next venue of care)     Recommendations for Other Services         Precautions/Restrictions   Precautions Precautions: Fall Recall of Precautions/Restrictions: Intact Restrictions Weight Bearing Restrictions Per Provider Order: No Other Position/Activity Restrictions: TPN feeds     Mobility Bed Mobility Overal bed mobility: Needs Assistance Bed Mobility: Supine to Sit, Sit to Supine     Supine to sit: Mod assist, +2 for safety/equipment, Used rails Sit to supine: Mod assist, +2 for safety/equipment   General bed mobility comments: Pt request needs for LE assistance, able to put forth effort to assist with encouragement    Transfers Overall transfer level: Needs assistance Equipment used: Rolling walker (2 wheels) Transfers: Sit to/from Stand Sit to Stand: Mod assist, +2 physical assistance           General transfer comment: MODA +2 for side steps up the head of the bed with RW. Verbal encouragement and DME management needed.      Balance Overall balance assessment: Needs assistance Sitting-balance support: Feet supported, Single extremity supported Sitting balance-Leahy Scale: Good Sitting balance - Comments: Initial sit up on the EOB, required BUE support, prolong sitting improved overall sitting balance, porgressed to no UE support.   Standing balance support: Bilateral upper extremity supported Standing balance-Leahy Scale: Poor Standing balance comment: Limited static standing tolerance                           ADL either performed or  assessed with clinical judgement   ADL Overall ADL's : Needs assistance/impaired Eating/Feeding: NPO (Not compliant with NPO status.)   Grooming: Brushing hair;Bed  level Grooming Details (indicate cue type and reason): Donning hat over hair at bed level, MODI                     Toileting- Clothing Manipulation and Hygiene: Maximal assistance;Sit to/from stand Toileting - Clothing Manipulation Details (indicate cue type and reason): Standing pericare     Functional mobility during ADLs: Moderate assistance;Rolling walker (2 wheels);+2 for safety/equipment General ADL Comments: MODI seated grooming tasks at bed level, MAXA pericare in standing with +2 for safety      Pertinent Vitals/Pain Pain Assessment Pain Assessment: Faces Faces Pain Scale: Hurts little more Pain Location: Abdominal, generalized weak Pain Descriptors / Indicators: Aching, Discomfort Pain Intervention(s): Limited activity within patient's tolerance, Monitored during session, Repositioned     Extremity/Trunk Assessment Upper Extremity Assessment Upper Extremity Assessment: Generalized weakness   Lower Extremity Assessment Lower Extremity Assessment: Defer to PT evaluation;Generalized weakness   Cervical / Trunk Assessment Cervical / Trunk Assessment: Normal   Communication Communication Communication: No apparent difficulties   Cognition Arousal: Alert Behavior During Therapy: WFL for tasks assessed/performed Cognition: No apparent impairments             OT - Cognition Comments: a/oX4                 Following commands: Intact       Cueing  General Comments   Cueing Techniques: Verbal cues;Gestural cues  PEG/lines an leads in tact pre/post session   Exercises Exercises: Other exercises Other Exercises Other Exercises: EDU: Role OT eval, safe ADL completion, energy conservation techniques   Shoulder Instructions      Home Living Family/patient expects to be discharged to:: Private residence Living Arrangements: Spouse/significant other Available Help at Discharge: Family;Available PRN/intermittently Type of Home: House Home  Access: Ramped entrance     Home Layout: One level     Bathroom Shower/Tub: Tub/shower unit;Sponge bathes at baseline   Bathroom Toilet: Handicapped height Bathroom Accessibility: No   Home Equipment: Agricultural consultant (2 wheels);Shower seat;Wheelchair - manual   Additional Comments: sleeps in a lift chair; uses wc for community ambulation and RW for household ambulation      Prior Functioning/Environment Prior Level of Function : Working/employed;Needs assist;History of Falls (last six months)             Mobility Comments: x2 falls within the last few week, rolling walker ADLs Comments: Pt reports needing assistance w/ ADLs per her husband's assistance    OT Problem List: Decreased strength;Decreased activity tolerance;Impaired balance (sitting and/or standing)   OT Treatment/Interventions: Self-care/ADL training;Therapeutic exercise;Energy conservation;Patient/family education      OT Goals(Current goals can be found in the care plan section)   Acute Rehab OT Goals Patient Stated Goal: Get stronger OT Goal Formulation: With patient Time For Goal Achievement: 03/24/24 Potential to Achieve Goals: Good ADL Goals Pt Will Perform Grooming: with set-up;standing Pt Will Perform Lower Body Dressing: sitting/lateral leans;with set-up Pt Will Transfer to Toilet: ambulating;with contact guard assist Pt Will Perform Toileting - Clothing Manipulation and hygiene: with min assist;sit to/from stand   OT Frequency:  Min 2X/week    Co-evaluation PT/OT/SLP Co-Evaluation/Treatment: Yes Reason for Co-Treatment: For patient/therapist safety;To address functional/ADL transfers PT goals addressed during session: Mobility/safety with mobility;Proper use of DME OT goals addressed during session: ADL's and self-care  AM-PAC OT 6 Clicks Daily Activity     Outcome Measure Help from another person eating meals?: None Help from another person taking care of personal grooming?: A  Little Help from another person toileting, which includes using toliet, bedpan, or urinal?: A Lot Help from another person bathing (including washing, rinsing, drying)?: A Lot Help from another person to put on and taking off regular upper body clothing?: A Little Help from another person to put on and taking off regular lower body clothing?: A Lot 6 Click Score: 16   End of Session Equipment Utilized During Treatment: Rolling walker (2 wheels) Nurse Communication: Other (comment) (Pt drinking water  dispite NPO status)  Activity Tolerance: Patient tolerated treatment well Patient left: in bed;with call bell/phone within reach;with bed alarm set;with family/visitor present  OT Visit Diagnosis: Unsteadiness on feet (R26.81);Other abnormalities of gait and mobility (R26.89);History of falling (Z91.81);Muscle weakness (generalized) (M62.81)                Time: 9097-9068 OT Time Calculation (min): 29 min Charges:  OT General Charges $OT Visit: 1 Visit OT Evaluation $OT Eval High Complexity: 1 High  Slyvia Lartigue M.S. OTR/L  03/10/24, 10:03 AM

## 2024-03-10 NOTE — Progress Notes (Signed)
 Progress Note   Patient: Tonya [name] FMW:[id number] DOB: [birth year] DOA: 03/09/2024     0 DOS: the patient was seen and examined on 03/10/2024   Brief hospital course: From HPI Tonya [name] is a pleasant 58 y.o. female with medical history significant for metastatic stage IV endometrial cancer, CAD, gastric reflux, hypertension, gastric outlet obstruction s/p PEG tube who was recently admitted to [facility] and discharged between 02/29/2024 and 03/08/2024 after being treated for intractable nausea vomiting due to gastric mass s/p PEG tube.  Patient was discharged home on PICC line, TPN and PEG tube. Patient is stated that she was discharged from the hospital yesterday, since going home she has been feeling very weak, having trouble getting around even with the help of her husband had a fall last night due to generalized weakness.  Patient stated that she was discharged too early before she was getting back to herself normal.  She denies any fever, nausea vomiting, abdominal pain, fever, hematemesis, melena.   ED Course: Upon arrival to the ED, patient is found to have severe deconditioning, hyperkalemia 5.6, severe dehydration/AKI with BUN 117 and creatinine 2.33 and leukocytosis at 24.3.  Hospitalist service was consulted for evaluation for admission for deconditioning, hyperkalemia and dehydration.  Of note she had persistent leukocytosis prior to discharge home and it was thought to be reactive.    Assessment and Plan:  Acute kidney injury secondary to volume depletion - Continue aggressive IV fluid resuscitation Patient is dehydrated Renal function was normal a month ago however her creatinine today is 2.3 Monitor renal function closely  Generalized weakness/deconditioning PT OT on board Patient will possibly need rehab  Leukocytosis with no signs of infection Urinalysis normal as well as chest x-ray reviewed that that did not show any infiltrate Continue to monitor  closely no antibiotics indicated at this time   Hyperkalemia:-Improved - May be related to dehydration, she is also on TPN - There is no EKG changes - Will continue to monitor    Metastatic endometrial cancer with gastric outlet obstruction s/p PEG tube - Patient is on TPN  Continue TPN Patient may take ice chips - Manage as per oncology at outpatient     Malnutrition due to unable to eat due to gastric outlet obstruction - Continue TPN for nutrition and dietitian recommendations    Hypothyroidism - She will be given thyroxine IV   .  HFpEF - Does not appear to be fluid overloaded - Will continue to monitor   GERD - Patient was given Protonix  IV which will be continued    DVT prophylaxis: Lovenox   Code Status: Full Code  Family Communication: Not available but she wants her niece to make decision  Disposition Plan: Home versus rehab  Consults called: None  Subjective:  Patient seen and examined at bedside this morning Denies worsening abdominal pain chest pain cough She tells me she feels generally weak thirsty and dehydrated Renal function markedly worsened today compared to baseline renal function of creatinine of 0.7  Physical Exam: \Constitutional: Alert, awake, calm, comfortable HEENT: Neck supple Respiratory: Clear to auscultation B/L, no wheezing, no rales.  Cardiovascular: Regular rate and rhythm, no murmurs / rubs / gallops. No extremity edema. 2+ pedal pulses. No carotid bruits.  Abdomen: Soft, no tenderness, Bowel sounds positive.  Musculoskeletal: no clubbing / cyanosis. Good ROM, no contractures. Normal muscle tone.  Skin: no rashes, lesions, ulcers. Neurologic: CN 2-12 grossly intact. Sensation intact, No focal deficit identified Psychiatric: Alert and oriented x  3. Normal mood.      Vitals:   03/09/24 1700 03/09/24 2042 03/10/24 0310 03/10/24 0803  BP: 118/69 114/74 114/71 106/75  Pulse:  95 88 93  Resp: 19   16  Temp:  97.8 F (36.6 C) 97.9  F (36.6 C) (!) 97.4 F (36.3 C)  TempSrc:  Oral Oral   SpO2:  100% 99% 100%  Weight:   90.3 kg   Height:        Data Reviewed:    Latest Ref Rng & Units 03/10/2024    5:12 AM 03/09/2024    9:48 AM 03/08/2024    5:20 AM  CBC  WBC 4.0 - 10.5 K/uL 21.0  24.3  27.5   Hemoglobin 12.0 - 15.0 g/dL 89.6  89.4  89.1   Hematocrit 36.0 - 46.0 % 29.6  31.6  32.1   Platelets 150 - 400 K/uL 194  201  204        Latest Ref Rng & Units 03/10/2024    5:12 AM 03/09/2024   11:19 AM 03/08/2024    5:20 AM  BMP  Glucose 70 - 99 mg/dL 91  735  77   BUN 6 - 20 mg/dL 875  882  94   Creatinine 0.44 - 1.00 mg/dL 7.67  7.66  8.34   Sodium 135 - 145 mmol/L 136  134  137   Potassium 3.5 - 5.1 mmol/L 4.8  5.6  4.9   Chloride 98 - 111 mmol/L 102  100  103   CO2 22 - 32 mmol/L 20  15  24    Calcium  8.9 - 10.3 mg/dL 8.4  8.8  9.0     I have reviewed the patient's chest x-ray  that did not show any infiltrate  Author: [name], MD 03/10/2024 1:33 PM  For on call review [email or web address].

## 2024-03-10 NOTE — Consult Note (Addendum)
 PHARMACY - TOTAL PARENTERAL NUTRITION CONSULT NOTE   Indication: Small bowel obstruction  Patient Measurements: Height: 5' 4 (162.6 cm) Weight: 90.3 kg (199 lb 1.2 oz) IBW/kg (Calculated) : 54.7 TPN AdjBW (KG): 62.3 Body mass index is 34.17 kg/m. Usual Weight: N/A  Assessment:  58 y.o. Tonya [name] [name] with medical history significant of stage IV metastatic endometrial cancer status postchemotherapy, SIADH, hypothyroidism, HTN, chronic HFpEF, presented with worsening of nausea vomiting abdominal pain. Patient discharged yesterday with plans to follow up with oncology, with new chemotherapy regimen set to initiate 7/22. Patient now back today after weakness with fall at home, now with AKI.  Glucose / Insulin : no history of DM Dexamethasone  taper, 10 mg decreased to 6 mg with most recent dose 7/17 Insulin  requirements the last 24h: unknown due to discharge yesterday.  Insulin  10 units in bag.  Electrolytes: K 5.6, Lokelma  x 1 ordered Renal: Scr 2.33, up from 1.65 yesterday Hepatic: AST slightly elevated at 43 Intake / Output: History of heart failure mIVF NS @ 100 cc/hr  GI Imaging: US  renal 7/17: Mild left hydronephrosis, unchanged from 7/9  CT abdomen/pelvis 7/11:  1. Interval marked fluid distension of the stomach with irregular wall thickening of the antral pyloric region with mucosal enhancement with otherwise decompressed appearance of the bowel, outlet obstruction or functional obstruction of the stomach cannot be excluded. NG decompression is recommended 2. Slight increased ascites within the abdomen and pelvis, now small-moderate volume. No free air. 3. New mild left-sided hydronephrosis and hydroureter, with more normal appearing caliber of distal left ureter in the pelvis, anterior to the iliac vessels. No obstructing stone. Findings are consistent with distal ureteral obstruction. Decreased excretion of contrast from the left kidney on delayed views. 4. Nodular soft tissue  thickening at the right posterior bladder, stable in the short interim but suspicious for neoplasm/metastatic disease. 5. Extensive abdominal, retroperitoneal and pelvic adenopathy consistent with metastatic disease. 6. 6 mm right lower lobe pulmonary nodule, stable, and also concerning for metastatic disease. 7. Aortic atherosclerosis.  GI Surgeries / Procedures:  EGD done 7/10 7/14: PERCUTANEOUS (VENTING) G-TUBE PLACEMENT (3600 ml of fluid removed)  Central access: 7/11 TPN start date: 7/11  Nutritional Goals: Goal TPN rate 60 mL/hr (provides 102 g of protein and 1906 kcals per day)  7/15: Trying to keep fluid low due to HFpEF and ascites. Provider wants fluid increase due to bump in Scr 7/16: Dr Caleen and Augustin (clinical dietitian) agreed on keeping TPN rate at 19ml/hr.  RD Assessment: Consulted. Most recent note 7/16 - patient remains at high refeed risk. Total Energy Estimated Needs: 2000-2300kcal/day Total Protein Estimated Needs: 100-115g/day Total Fluid Estimated Needs: 1.4-1.6L/day  Current Nutrition: NPO  Plan:  Increase TPN rate at 60 mL/hr  Electrolytes in TPN: Na 69mEq/L, K 20 > 11mEq/L (decreased 7/18), Ca 16mEq/L, Mg 53mEq/L (added back 7/17), and Phos 20 > 10 mmol/L Cl:Ac 1:2 Add standard MVI and trace elements to TPN. Continue Resistant q6h SSI and insulin  10 > Tonya [name] units of regular insulin  on TPN bag. Will adjust as dexamethasone  taper adjusted. Next TG check scheduled with TPN labs Monitor TPN labs on Mon/Thurs   Cathaleen Blanch, PharmD Clinical Pharmacist 03/10/2024 Tonya [name]:03 AM

## 2024-03-10 NOTE — Evaluation (Signed)
 Physical Therapy Evaluation Patient Details Name: Tonya [name] MRN: [id number] DOB: August 22, 1966 Today's Date: 03/10/2024  History of Present Illness  59 y/o female presented to ED on 03/09/24 for generalized weakness and fall. Recent hospitalization 7/9-7/17 for gastric mass s/p PEG tube. Admitted for dehydration. 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  Clinical Impression  Patient admitted with the above. PTA, patient lives with husband and was using RW for mobility. Recently admitted and returned to hospital due to falls and weakness within 24 hours of discharge. Patient presents with weakness, impaired balance, and decreased activity tolerance. Required modA+2 for bed mobility. Required encouragement to participate in standing at EOB. Stood with modA+2 and able to take 3-4 side steps towards HOB. Only able to tolerate ~20-30 seconds of standing. Patient will benefit from skilled PT services during acute stay to address listed deficits. Patient will benefit from ongoing therapy at discharge to maximize functional independence and safety.         If plan is discharge home, recommend the following: A lot of help with walking and/or transfers;A lot of help with bathing/dressing/bathroom;Assistance with cooking/housework;Assist for transportation;Help with stairs or ramp for entrance   Can travel by private vehicle   No    Equipment Recommendations Other (comment) (TBD)  Recommendations for Other Services       Functional Status Assessment Patient has had a recent decline in their functional status and demonstrates the ability to make significant improvements in function in a reasonable and predictable amount of time.     Precautions / Restrictions Precautions Precautions: Fall Recall of Precautions/Restrictions: Intact Restrictions Weight Bearing Restrictions Per Provider Order: No Other Position/Activity  Restrictions: TPN feeds      Mobility  Bed Mobility Overal bed mobility: Needs Assistance Bed Mobility: Supine to Sit, Sit to Supine     Supine to sit: Mod assist, +2 for safety/equipment, Used rails Sit to supine: Mod assist, +2 for safety/equipment   General bed mobility comments: Pt request needs for LE assistance, able to put forth effort to assist with encouragement    Transfers Overall transfer level: Needs assistance Equipment used: Rolling Yaneliz Radebaugh (2 wheels) Transfers: Sit to/from Stand Sit to Stand: Mod assist, +2 physical assistance           General transfer comment: MODA +2 for side steps up the head of the bed with RW. Verbal encouragement and DME management needed. Only able to stand ~20-30 seconds    Ambulation/Gait                  Stairs            Wheelchair Mobility     Tilt Bed    Modified Rankin (Stroke Patients Only)       Balance Overall balance assessment: Needs assistance Sitting-balance support: Feet supported, Single extremity supported Sitting balance-Leahy Scale: Good Sitting balance - Comments: Initial sit up on the EOB, required BUE support, prolong sitting improved overall sitting balance, porgressed to no UE support.   Standing balance support: Bilateral upper extremity supported Standing balance-Leahy Scale: Poor Standing balance comment: Limited static standing tolerance                             Pertinent Vitals/Pain Pain Assessment Pain Assessment: Faces Faces Pain Scale: Hurts little more Pain Location: Abdominal, generalized weak Pain Descriptors / Indicators: Aching, Discomfort Pain Intervention(s): Limited activity within patient's tolerance, Monitored during  session, Repositioned    Home Living Family/patient expects to be discharged to:: Private residence Living Arrangements: Spouse/significant other Available Help at Discharge: Family;Available PRN/intermittently Type of Home:  House Home Access: Ramped entrance       Home Layout: One level Home Equipment: Agricultural consultant (2 wheels);Shower seat;Wheelchair - manual Additional Comments: sleeps in a lift chair; uses wc for community ambulation and RW for household ambulation    Prior Function Prior Level of Function : Working/employed;Needs assist;History of Falls (last six months)             Mobility Comments: x2 falls within the last few week, rolling Trevyon Swor ADLs Comments: Pt reports needing assistance w/ ADLs per her husband's assistance     Extremity/Trunk Assessment   Upper Extremity Assessment Upper Extremity Assessment: Defer to OT evaluation    Lower Extremity Assessment Lower Extremity Assessment: Generalized weakness    Cervical / Trunk Assessment Cervical / Trunk Assessment: Normal  Communication   Communication Communication: No apparent difficulties    Cognition Arousal: Alert Behavior During Therapy: WFL for tasks assessed/performed   PT - Cognitive impairments: No apparent impairments                         Following commands: Intact       Cueing Cueing Techniques: Verbal cues, Gestural cues     General Comments General comments (skin integrity, edema, etc.): PEG/lines an leads in tact pre/post session    Exercises     Assessment/Plan    PT Assessment Patient needs continued PT services  PT Problem List Decreased strength;Decreased activity tolerance;Decreased balance;Decreased mobility;Pain;Cardiopulmonary status limiting activity;Decreased safety awareness;Decreased knowledge of use of DME;Decreased knowledge of precautions       PT Treatment Interventions DME instruction;Gait training;Stair training;Functional mobility training;Therapeutic activities;Therapeutic exercise;Balance training;Neuromuscular re-education    PT Goals (Current goals can be found in the Care Plan section)  Acute Rehab PT Goals Patient Stated Goal: to get stronger PT Goal  Formulation: With patient/family Time For Goal Achievement: 03/24/24 Potential to Achieve Goals: Fair    Frequency Min 2X/week     Co-evaluation PT/OT/SLP Co-Evaluation/Treatment: Yes Reason for Co-Treatment: For patient/therapist safety;To address functional/ADL transfers PT goals addressed during session: Mobility/safety with mobility;Proper use of DME OT goals addressed during session: ADL's and self-care       AM-PAC PT 6 Clicks Mobility  Outcome Measure Help needed turning from your back to your side while in a flat bed without using bedrails?: Total Help needed moving from lying on your back to sitting on the side of a flat bed without using bedrails?: Total Help needed moving to and from a bed to a chair (including a wheelchair)?: Total Help needed standing up from a chair using your arms (e.g., wheelchair or bedside chair)?: Total Help needed to walk in hospital room?: Total Help needed climbing 3-5 steps with a railing? : Total 6 Click Score: 6    End of Session   Activity Tolerance: Patient limited by fatigue Patient left: in bed;with call bell/phone within reach;with bed alarm set Nurse Communication: Mobility status PT Visit Diagnosis: Unsteadiness on feet (R26.81);Muscle weakness (generalized) (M62.81);Difficulty in walking, not elsewhere classified (R26.2);History of falling (Z91.81);Other abnormalities of gait and mobility (R26.89)    Time: 9098-9068 PT Time Calculation (min) (ACUTE ONLY): 30 min   Charges:   PT Evaluation $PT Eval Moderate Complexity: 1 Mod   PT General Charges $$ ACUTE PT VISIT: 1 Visit  Maryanne Finder, PT, DPT Physical Therapist - [place]  [facility]   Brexton Sofia A P[place]oua Schreiner 03/10/2024, 1:15 PM

## 2024-03-10 NOTE — Plan of Care (Signed)

## 2024-03-10 NOTE — Progress Notes (Signed)
 Initial Nutrition Assessment   DOCUMENTATION CODES:   Severe malnutrition in context of chronic illness  INTERVENTION:   Continue TPN per pharmacy- provides 1906kcal/day and 102g/day of protein  Pt at refeed risk; recommend monitor potassium, magnesium  and phosphorus labs daily until stable  Daily weights   NUTRITION DIAGNOSIS:   Severe Malnutrition related to chronic illness as evidenced by moderate fat depletion, moderate muscle depletion, 36 percent weight loss in 10 months. -diagnosed from exam on 7/11  GOAL:   Patient will meet greater than or equal to 90% of their needs  MONITOR:   Labs, Weight trends, Skin, I & O's, Other (Comment) (TPN)  ASSESSMENT:   58 y/o female with h/o stage IV metastatic endometrial cancer on chemo since 2/3, hypothyroid disease, ischemic cardiomyopathy, HTN, CAD, C-diff, compression fracture, HLD, GERD, MI, CHF, SIADH and recent admission for GOO secondary to metastatic disease from large gastric & duodenal masses s/p 49F venting G-tube placement 7/14 and new TPN initiation who is admitted with weakness and FTT.  RD working remotely.  Pt is well known to this RD from her recent admission; pt discharged on 7/17. Pt has remained NPO. G-tube being used for venting as needed. Pt tolerating TPN well. Pt is at high risk for volume overload. Per chart, pt is up ~12lbs from her last admission if her bed weights are correct; RD will monitor daily weights. RD will obtain exam and history at follow up.  Medications reviewed and include: dexamethasone , lovenox , insulin , synthroid , protonix , TPN  Labs reviewed: K 4.8 wnl, BUN 124(H), creat 2.32(H), P 5.0(H) Triglycerides- 184- 7/16 Wbc- 21.0(H), Hgb 10.3(L), Hct 29.6(L) Cbgs- 89, 91 x 24 hrs  Diet Order:   Diet Order             Diet NPO time specified Except for: Ice Chips  Diet effective now                  EDUCATION NEEDS:   No education needs have been identified at this time  Skin:   Skin Assessment: Reviewed RN Assessment  Last BM:  7/18  Height:   Ht Readings from Last 1 Encounters:  03/09/24 5' 4 (1.626 m)    Weight:   Wt Readings from Last 1 Encounters:  03/10/24 90.3 kg    Ideal Body Weight:  54.5 kg  BMI:  Body mass index is 34.17 kg/m.  Estimated Nutritional Needs:   Kcal:  2000-2300kcal/day  Protein:  100-115g/day  Fluid:  1.4-1.6L/day  Tonya Shams MS, RD, [name] If unable to be reached, please send secure chat to RD inpatient available from 8:00a-4:00p daily

## 2024-03-11 DIAGNOSIS — E860 Dehydration: Secondary | ICD-10-CM | POA: Diagnosis not present

## 2024-03-11 LAB — CBC WITH DIFFERENTIAL/PLATELET
Abs Immature Granulocytes: 0.38 K/uL — ABNORMAL HIGH (ref 0.00–0.07)
Basophils Absolute: 0.1 K/uL (ref 0.0–0.1)
Basophils Relative: 0 %
Eosinophils Absolute: 0 K/uL (ref 0.0–0.5)
Eosinophils Relative: 0 %
HCT: 32.6 % — ABNORMAL LOW (ref 36.0–46.0)
Hemoglobin: 11 g/dL — ABNORMAL LOW (ref 12.0–15.0)
Immature Granulocytes: 2 %
Lymphocytes Relative: 3 %
Lymphs Abs: 0.6 K/uL — ABNORMAL LOW (ref 0.7–4.0)
MCH: 33.3 pg (ref 26.0–34.0)
MCHC: 33.7 g/dL (ref 30.0–36.0)
MCV: 98.8 fL (ref 80.0–100.0)
Monocytes Absolute: 1.4 K/uL — ABNORMAL HIGH (ref 0.1–1.0)
Monocytes Relative: 6 %
Neutro Abs: 20.9 K/uL — ABNORMAL HIGH (ref 1.7–7.7)
Neutrophils Relative %: 89 %
Platelets: 184 K/uL (ref 150–400)
RBC: 3.3 MIL/uL — ABNORMAL LOW (ref 3.87–5.11)
RDW: 17.5 % — ABNORMAL HIGH (ref 11.5–15.5)
WBC: 23.4 K/uL — ABNORMAL HIGH (ref 4.0–10.5)
nRBC: 0 % (ref 0.0–0.2)

## 2024-03-11 LAB — BASIC METABOLIC PANEL WITH GFR
Anion gap: 13 (ref 5–15)
BUN: 128 mg/dL — ABNORMAL HIGH (ref 6–20)
CO2: 18 mmol/L — ABNORMAL LOW (ref 22–32)
Calcium: 8.3 mg/dL — ABNORMAL LOW (ref 8.9–10.3)
Chloride: 103 mmol/L (ref 98–111)
Creatinine, Ser: 2.46 mg/dL — ABNORMAL HIGH (ref 0.44–1.00)
GFR, Estimated: 22 mL/min — ABNORMAL LOW (ref >60)
Glucose, Bld: 123 mg/dL — ABNORMAL HIGH (ref 70–99)
Potassium: 5 mmol/L (ref 3.5–5.1)
Sodium: 134 mmol/L — ABNORMAL LOW (ref 135–145)

## 2024-03-11 LAB — GLUCOSE, CAPILLARY
Glucose-Capillary: 116 mg/dL — ABNORMAL HIGH (ref 70–99)
Glucose-Capillary: 132 mg/dL — ABNORMAL HIGH (ref 70–99)
Glucose-Capillary: 133 mg/dL — ABNORMAL HIGH (ref 70–99)
Glucose-Capillary: 141 mg/dL — ABNORMAL HIGH (ref 70–99)
Glucose-Capillary: 145 mg/dL — ABNORMAL HIGH (ref 70–99)
Glucose-Capillary: 167 mg/dL — ABNORMAL HIGH (ref 70–99)

## 2024-03-11 MED ORDER — SIMETHICONE 40 MG/0.6ML PO SUSP
40.0000 mg | Freq: Two times a day (BID) | ORAL | Status: DC
  Administered 2024-03-11 – 2024-03-17 (×13): 40 mg
  Filled 2024-03-11 (×4): qty 30
  Filled 2024-03-11: qty 0.6
  Filled 2024-03-11 (×3): qty 30
  Filled 2024-03-11: qty 0.6
  Filled 2024-03-11: qty 30
  Filled 2024-03-11 (×2): qty 0.6
  Filled 2024-03-11 (×4): qty 30

## 2024-03-11 MED ORDER — TRACE MINERALS CU-MN-SE-ZN 300-55-60-3000 MCG/ML IV SOLN
INTRAVENOUS | Status: AC
  Filled 2024-03-11: qty 681.6

## 2024-03-11 MED ORDER — SODIUM CHLORIDE 0.9 % IV SOLN: INTRAVENOUS | Status: AC

## 2024-03-11 NOTE — Plan of Care (Signed)
 The patient's TPN is running at 54ml/hr via PICC to RUE along with 0.9% sodium chloride  at 165ml/hr. There is no s/s of infiltrate noted.

## 2024-03-11 NOTE — Plan of Care (Signed)

## 2024-03-11 NOTE — Progress Notes (Signed)
 Progress Note   Patient: Tonya [name] FMW:[id number] DOB: February 28, 1966 DOA: 03/09/2024     1 DOS: the patient was seen and examined on 03/11/2024     Brief hospital course: From HPI Tonya [name] is a pleasant 59 y.o. female with medical history significant for metastatic stage IV endometrial cancer, CAD, gastric reflux, hypertension, gastric outlet obstruction s/p PEG tube who was recently admitted to [facility] and discharged between 02/29/2024 and 03/08/2024 after being treated for intractable nausea vomiting due to gastric mass s/p PEG tube.  Patient was discharged home on PICC line, TPN and PEG tube. Patient is stated that she was discharged from the hospital yesterday, since going home she has been feeling very weak, having trouble getting around even with the help of her husband had a fall last night due to generalized weakness.  Patient stated that she was discharged too early before she was getting back to herself normal.  She denies any fever, nausea vomiting, abdominal pain, fever, hematemesis, melena.   ED Course: Upon arrival to the ED, patient is found to have severe deconditioning, hyperkalemia 5.6, severe dehydration/AKI with BUN 117 and creatinine 2.33 and leukocytosis at 24.3.  Hospitalist service was consulted for evaluation for admission for deconditioning, hyperkalemia and dehydration.  Of note she had persistent leukocytosis prior to discharge home and it was thought to be reactive.     Assessment and Plan:   Acute kidney injury secondary to volume depletion - Continue aggressive IV fluid resuscitation Patient is dehydrated Renal function was normal a month ago however her creatinine today is 2.3 Monitor renal function closely  Generalized weakness/deconditioning PT OT on board Patient will possibly need rehab   Leukocytosis with no signs of infection Urinalysis normal as well as chest x-ray reviewed that that did not show any infiltrate Continue to monitor  closely no antibiotics indicated at this time   Hyperkalemia:-Improved - May be related to dehydration, she is also on TPN - There is no EKG changes - Will continue to monitor    Metastatic endometrial cancer with gastric outlet obstruction s/p PEG tube Continue TPN Patient may take ice chips - Manage as per oncology at outpatient     Malnutrition due to unable to eat due to gastric outlet obstruction - Continue TPN for nutrition and dietitian recommendations    Hypothyroidism Continue thyroxine IV   .  HFpEF - Does not appear to be fluid overloaded - Will continue to monitor   GERD - Patient was given Protonix  IV which will be continued     DVT prophylaxis: Lovenox    Code Status: Full Code   Family Communication: Not available but she wants her niece to make decision   Disposition Plan: Home versus rehab   Consults called: None   Subjective:  Patient seen and examined at bedside this morning Patient tells me her generalized weakness is improving Chronic leukocytosis noted as well as hyperkalemia improved IV fluid continue She denied any acute overnight events   Physical Exam: \Constitutional: Alert, awake, calm, comfortable HEENT: Neck supple Respiratory: Clear to auscultation B/L, no wheezing, no rales.  Cardiovascular: Regular rate and rhythm, no murmurs / rubs / gallops. No extremity edema. 2+ pedal pulses. No carotid bruits.  Abdomen: Soft, no tenderness, PEG tube in place Musculoskeletal: no clubbing / cyanosis. Good ROM, no contractures. Normal muscle tone.  Skin: no rashes, lesions, ulcers. Neurologic: CN 2-12 grossly intact. Sensation intact, No focal deficit identified Psychiatric: Alert and oriented x 3. Normal mood.  Data Reviewed:    Latest Ref Rng & Units 03/11/2024    9:38 AM 03/10/2024    5:12 AM 03/09/2024    9:48 AM  CBC  WBC 4.0 - 10.5 K/uL 23.4  21.0  24.3   Hemoglobin 12.0 - 15.0 g/dL 88.9  89.6  89.4   Hematocrit 36.0 - 46.0 % 32.6   29.6  31.6   Platelets 150 - 400 K/uL 184  194  201        Latest Ref Rng & Units 03/11/2024    9:38 AM 03/10/2024    5:12 AM 03/09/2024   11:19 AM  BMP  Glucose 70 - 99 mg/dL 876  91  735   BUN 6 - 20 mg/dL 871  875  882   Creatinine 0.44 - 1.00 mg/dL 7.53  7.67  7.66   Sodium 135 - 145 mmol/L 134  136  134   Potassium 3.5 - 5.1 mmol/L 5.0  4.8  5.6   Chloride 98 - 111 mmol/L 103  102  100   CO2 22 - 32 mmol/L 18  20  15    Calcium  8.9 - 10.3 mg/dL 8.3  8.4  8.8     Vitals:   03/11/24 0350 03/11/24 0729 03/11/24 0929 03/11/24 1300  BP: 97/65 98/68 105/72 100/72  Pulse: 93 (!) 101 (!) 103 100  Resp: 18 19 19 18   Temp: (!) 97.3 F (36.3 C) 97.6 F (36.4 C) 97.6 F (36.4 C) (!) 97.5 F (36.4 C)  TempSrc:  Oral  Oral  SpO2: 100% 100% 100% 99%  Weight:      Height:        Author: [name], MD 03/11/2024 2:51 PM  For on call review [email or web address].

## 2024-03-11 NOTE — Consult Note (Addendum)
 PHARMACY - TOTAL PARENTERAL NUTRITION CONSULT NOTE   Indication: Small bowel obstruction  Patient Measurements: Height: 5' 4 (162.6 cm) Weight: 89.2 kg (196 lb 10.4 oz) IBW/kg (Calculated) : 54.7 TPN AdjBW (KG): 62.3 Body mass index is 33.76 kg/m. Usual Weight: N/A  Assessment:  58 y.o. 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 Patient discharged yesterday with plans to follow up with oncology, with new chemotherapy regimen set to initiate 7/22. Patient now back today after weakness with fall at home, now with AKI.  Glucose / Insulin : no history of DM BG 116.  Dexamethasone  taper, 10 mg decreased to 6 mg with most recent dose 7/17 Insulin  requirements the last 24h: 7 Insulin  8 units in bag.  Electrolytes: K 5.6 > 4.8 > 5,  Renal: Scr 2.33 > 2.46, up from 1.65 yesterday Hepatic: AST slightly elevated at 43 > 50 Intake / Output: History of heart failure mIVF NS @ 100 cc/hr  GI Imaging: US  renal 7/17: Mild left hydronephrosis, unchanged from 7/9  CT abdomen/pelvis 7/11:  1. Interval marked fluid distension of the stomach with irregular wall thickening of the antral pyloric region with mucosal enhancement with otherwise decompressed appearance of the bowel, outlet obstruction or functional obstruction of the stomach cannot be excluded. NG decompression is recommended 2. Slight increased ascites within the abdomen and pelvis, now small-moderate volume. No free air. 3. New mild left-sided hydronephrosis and hydroureter, with more normal appearing caliber of distal left ureter in the pelvis, anterior to the iliac vessels. No obstructing stone. Findings are consistent with distal ureteral obstruction. Decreased excretion of contrast from the left kidney on delayed views. 4. Nodular soft tissue thickening at the right  posterior bladder, stable in the short interim but suspicious for neoplasm/metastatic disease. 5. Extensive abdominal, retroperitoneal and pelvic adenopathy consistent with metastatic disease. 6. 6 mm right lower lobe pulmonary nodule, stable, and also concerning for metastatic disease. 7. Aortic atherosclerosis.  GI Surgeries / Procedures:  EGD done 7/10 7/14: PERCUTANEOUS (VENTING) G-TUBE PLACEMENT (3600 ml of fluid removed)  Central access: 7/11 TPN start date: 7/11  Nutritional Goals: Goal TPN rate 60 mL/hr (provides 102 g of protein and 1906 kcals per day)  7/15: Trying to keep fluid low due to HFpEF and ascites. Provider wants fluid increase due to bump in Scr 7/16: Dr Caleen and Augustin (clinical dietitian) agreed on keeping TPN rate at 3ml/hr.  RD Assessment: Consulted. Most recent note 7/16 - patient remains at high refeed risk. Total Energy Estimated Needs: 2000-2300kcal/day Total Protein Estimated Needs: 100-115g/day Total Fluid Estimated Needs: 1.4-1.6L/day  Current Nutrition: NPO  Plan:  Continue TPN rate at 60 mL/hr  Electrolytes in TPN: Na 50mEq/L, K 20 > 15 > 0 mEq/L (decreased 7/20), Ca 88mEq/L, Mg 79mEq/L (added back 7/17), and Phos 20 > 10 mmol/L Cl:Ac 1:2 Add standard MVI and trace elements to TPN. Continue Resistant q6h SSI and insulin  10 > 8 units of regular insulin  on TPN bag. Will adjust as dexamethasone  taper adjusted. Next TG check scheduled with TPN labs Monitor TPN labs on Mon/Thurs   Cathaleen Blanch, PharmD Clinical Pharmacist 03/11/2024 10:19 AM

## 2024-03-12 ENCOUNTER — Encounter: Payer: Self-pay | Admitting: Oncology

## 2024-03-12 ENCOUNTER — Inpatient Hospital Stay

## 2024-03-12 ENCOUNTER — Inpatient Hospital Stay: Admitting: Nurse Practitioner

## 2024-03-12 ENCOUNTER — Inpatient Hospital Stay: Admitting: Hospice and Palliative Medicine

## 2024-03-12 ENCOUNTER — Other Ambulatory Visit

## 2024-03-12 DIAGNOSIS — E860 Dehydration: Secondary | ICD-10-CM | POA: Diagnosis not present

## 2024-03-12 LAB — CBC WITH DIFFERENTIAL/PLATELET
Abs Immature Granulocytes: 0.37 K/uL — ABNORMAL HIGH (ref 0.00–0.07)
Basophils Absolute: 0 K/uL (ref 0.0–0.1)
Basophils Relative: 0 %
Eosinophils Absolute: 0 K/uL (ref 0.0–0.5)
Eosinophils Relative: 0 %
HCT: 30.4 % — ABNORMAL LOW (ref 36.0–46.0)
Hemoglobin: 10.4 g/dL — ABNORMAL LOW (ref 12.0–15.0)
Immature Granulocytes: 2 %
Lymphocytes Relative: 2 %
Lymphs Abs: 0.5 K/uL — ABNORMAL LOW (ref 0.7–4.0)
MCH: 33.3 pg (ref 26.0–34.0)
MCHC: 34.2 g/dL (ref 30.0–36.0)
MCV: 97.4 fL (ref 80.0–100.0)
Monocytes Absolute: 1.3 K/uL — ABNORMAL HIGH (ref 0.1–1.0)
Monocytes Relative: 6 %
Neutro Abs: 21.6 K/uL — ABNORMAL HIGH (ref 1.7–7.7)
Neutrophils Relative %: 90 %
Platelets: 176 K/uL (ref 150–400)
RBC: 3.12 MIL/uL — ABNORMAL LOW (ref 3.87–5.11)
RDW: 17.6 % — ABNORMAL HIGH (ref 11.5–15.5)
WBC: 23.8 K/uL — ABNORMAL HIGH (ref 4.0–10.5)
nRBC: 0 % (ref 0.0–0.2)

## 2024-03-12 LAB — BASIC METABOLIC PANEL WITH GFR
Anion gap: 18 — ABNORMAL HIGH (ref 5–15)
BUN: 111 mg/dL — ABNORMAL HIGH (ref 6–20)
CO2: 15 mmol/L — ABNORMAL LOW (ref 22–32)
Calcium: 8.4 mg/dL — ABNORMAL LOW (ref 8.9–10.3)
Chloride: 105 mmol/L (ref 98–111)
Creatinine, Ser: 2.49 mg/dL — ABNORMAL HIGH (ref 0.44–1.00)
GFR, Estimated: 22 mL/min — ABNORMAL LOW (ref >60)
Glucose, Bld: 148 mg/dL — ABNORMAL HIGH (ref 70–99)
Potassium: 3.5 mmol/L (ref 3.5–5.1)
Sodium: 138 mmol/L (ref 135–145)

## 2024-03-12 LAB — GLUCOSE, CAPILLARY
Glucose-Capillary: 123 mg/dL — ABNORMAL HIGH (ref 70–99)
Glucose-Capillary: 125 mg/dL — ABNORMAL HIGH (ref 70–99)
Glucose-Capillary: 134 mg/dL — ABNORMAL HIGH (ref 70–99)
Glucose-Capillary: 134 mg/dL — ABNORMAL HIGH (ref 70–99)
Glucose-Capillary: 141 mg/dL — ABNORMAL HIGH (ref 70–99)
Glucose-Capillary: 164 mg/dL — ABNORMAL HIGH (ref 70–99)

## 2024-03-12 LAB — MAGNESIUM: Magnesium: 1.9 mg/dL (ref 1.7–2.4)

## 2024-03-12 LAB — PHOSPHORUS: Phosphorus: 5.8 mg/dL — ABNORMAL HIGH (ref 2.5–4.6)

## 2024-03-12 LAB — TRIGLYCERIDES: Triglycerides: 202 mg/dL — ABNORMAL HIGH (ref <150)

## 2024-03-12 MED ORDER — TRACE MINERALS CU-MN-SE-ZN 300-55-60-3000 MCG/ML IV SOLN
INTRAVENOUS | Status: AC
  Filled 2024-03-12: qty 681.6

## 2024-03-12 MED ORDER — SODIUM CHLORIDE 0.9 % IV SOLN: INTRAVENOUS | Status: DC

## 2024-03-12 NOTE — TOC Initial Note (Signed)
 Transition of Care [facility]) - Initial/Assessment Note    Patient Details  Name: Tonya [name] MRN: [id number] Date of Birth: 30-Jan-1966  Transition of Care [facility] CM/SW Contact:    Dalia GORMAN Fuse, RN Phone Number: 03/12/2024, 12:18 PM  Clinical Narrative:   TOC received a voicemail message from [facility] [phone] with [facility]. She requested a return call to coordinate care and the dc dummary be faxed to [phone] when the patient is medically ready. TOC returned call and left a voicemail message.                       Patient Goals and CMS Choice            Expected Discharge Plan and Services                                              Prior Living Arrangements/Services                       Activities of Daily Living   ADL Screening (condition at time of admission) Independently performs ADLs?: No Does the patient have a NEW difficulty with bathing/dressing/toileting/self-feeding that is expected to last >3 days?: Yes (Initiates electronic notice to provider for possible OT consult) Does the patient have a NEW difficulty with getting in/out of bed, walking, or climbing stairs that is expected to last >3 days?: Yes (Initiates electronic notice to provider for possible PT consult) Does the patient have a NEW difficulty with communication that is expected to last >3 days?: No Is the patient deaf or have difficulty hearing?: No Does the patient have difficulty seeing, even when wearing glasses/contacts?: No Does the patient have difficulty concentrating, remembering, or making decisions?: No  Permission Sought/Granted                  Emotional Assessment              Admission diagnosis:  Dehydration [E86.0] Patient Active Problem List   Diagnosis Date Noted   Dehydration 03/09/2024   Muscular deconditioning 03/09/2024   Acute hyperkalemia 03/09/2024   Protein-calorie malnutrition, severe 03/03/2024   Leukocytosis  03/02/2024   Hypokalemia 03/02/2024   Chronic HFrEF (heart failure with reduced ejection fraction) (HCC) 03/02/2024   Normocytic anemia 03/02/2024   Intractable nausea and vomiting 03/01/2024   Intractable nausea 03/01/2024   Gastric mass 03/01/2024   Goals of care, counseling/discussion 02/28/2024   Neutropenia, febrile (HCC) 11/04/2023   Actinomyces infection 10/12/2023   Closed compression fracture of body of L1 vertebra (HCC) 10/11/2023   Thrombocytopenia (HCC) 10/10/2023   Bacteremia due to Streptococcus 10/05/2023   Hypophosphatemia 10/04/2023   AKI (acute kidney injury) (HCC) 10/03/2023   Chronic hyponatremia 10/03/2023   Metabolic acidosis 10/03/2023   Neutropenia (HCC) 10/03/2023   C. 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) 09/21/2023   Endometrial carcinoma (HCC) 09/21/2023   Malignant neoplasm of uterus (HCC) 09/01/2023   B12 deficiency 05/25/2023   Vitamin D deficiency 05/25/2023   Coronary artery disease involving native coronary artery of native heart with angina pectoris (HCC) 07/24/2022   Type 2 diabetes mellitus with peripheral angiopathy (HCC) 05/18/2022   Ischemic cardiomyopathy 08/09/2019   Essential hypertension 08/09/2019  Hyperlipidemia associated with type 2 diabetes mellitus (HCC) 08/09/2019   Morbid obesity (HCC) 08/09/2019   Diabetes mellitus (HCC) 08/09/2019   NSTEMI (non-ST elevated myocardial infarction) (HCC) 07/23/2019   Hypothyroidism 07/23/2019   PCP:  Cleotilde Oneil FALCON, MD Pharmacy:   [id number] - [place], [place] - [address] AT [facility] [address] [place] [address] Phone: [phone] Fax: [phone]     Social Drivers of Health (SDOH) Social History: SDOH Screenings   Food Insecurity: No Food Insecurity (03/09/2024)  Housing: Low Risk   (03/09/2024)  Transportation Needs: No Transportation Needs (03/09/2024)  Utilities: Not At Risk (03/09/2024)  Depression (PHQ2-9): Low Risk  (02/22/2024)  Financial Resource Strain: Low Risk  (07/23/2019)  Physical Activity: Unknown (07/23/2019)  Social Connections: Moderately Integrated (03/09/2024)  Stress: No Stress Concern Present (07/23/2019)  Tobacco Use: Low Risk  (03/09/2024)  Health Literacy: Adequate Health Literacy (09/29/2023)   SDOH Interventions:     Readmission Risk Interventions    03/03/2024    2:15 PM 10/05/2023    2:26 PM  Readmission Risk Prevention Plan  Transportation Screening Complete Complete  HRI or Home Care Consult  Complete  Palliative Care Screening  Complete  Medication Review (RN Care Manager) Complete Complete  PCP or Specialist appointment within 3-5 days of discharge Complete   HRI or Home Care Consult Complete   Palliative Care Screening Not Applicable   Skilled Nursing Facility Not Applicable

## 2024-03-12 NOTE — Progress Notes (Signed)
 OT Cancellation Note  Patient Details Name: Tonya [name] MRN: [id number] DOB: May 20, 1966   Cancelled Treatment:    Reason Eval/Treat Not Completed: Patient declined, no reason specified.  Upon arrival, patient in bed refusing to participate in therapy services today reporting that she cannot catch her breath.  Vitals monitored at HR: 109 and O2 on room air at 98%, patient's RN notified of this.  Patient requested to speak with Case Manager today if possible when her husband is here.   Harlene Sharps OTR/L    Harlene LITTIE Sharps 03/12/2024, 2:05 PM

## 2024-03-12 NOTE — Progress Notes (Signed)
 Brief Interventional Radiology Note:  58 year old female with metastatic uterine cancer who presented to the ED on 7/9 with persistent nausea/vomiting for 2 weeks. Found to have gastric outlet obstruction vs functional obstruction; NGT placed for gastric decompression. EGD on 7/10 revealed large gastric mass oozing blood and separate pyloric mass going into the duodenum and obstructing. The patient has been seen by GI, Oncology and palliative care. The patient is not interested in hospice at this time and would like to attempt additional chemotherapy. IR was consulted for venting G-tube which was placed on 7/14 by Dr. JINNY Hall.  Patient requested IR APP come by to see her today to discuss plans for the G-tube moving forward. Discussed with patient that G-tube is strictly for venting purposes to help with her abdominal discomfort and nausea. Per notes, plans are to continue TPN for nutrition and remain NPO for the foreseeable future. Discussed with patient that this may be a lifelong solution, but we will have to see how she progresses. Patient verbalized understanding.   G-tube connected to drainage bag with ~300cc clear gastric fluid. Denies any pain around the insertion site, though she is acutely tender throughout her abdomen. 2 T-tacks in place; will need removal in the next 3-5 days. Overlying dressing changed and clean/dry dressing placed over top.   IR will keep on list and remove T-tacks in the next few days if patient is still admitted.   Electronically Signed: Nadyne Gariepy M Johnston Maddocks, PA-C 03/12/2024, 3:41 PM

## 2024-03-12 NOTE — Progress Notes (Signed)
 Progress Note   Patient: Tonya [name] FMW:[id number] DOB: Aug 30, 1965 DOA: 03/09/2024     2 DOS: the patient was seen and examined on 03/12/2024     Brief hospital course: From HPI Tonya [name] is a pleasant 58 y.o. female with medical history significant for metastatic stage IV endometrial cancer, CAD, gastric reflux, hypertension, gastric outlet obstruction s/p PEG tube who was recently admitted to [facility] and discharged between 02/29/2024 and 03/08/2024 after being treated for intractable nausea vomiting due to gastric mass s/p PEG tube.  Patient was discharged home on PICC line, TPN and PEG tube. Patient stated since going home she has been feeling very weak, having trouble getting around even with the help of her husband had a fall due to generalized weakness.    ED Course: Upon arrival to the ED, patient is found to have severe deconditioning, hyperkalemia 5.6, severe dehydration/AKI with BUN 117 and creatinine 2.33 and leukocytosis at 24.3.  Hospitalist service was consulted for evaluation for admission for deconditioning, hyperkalemia and dehydration.  Of note she had persistent leukocytosis prior to discharge home and it was thought to be reactive.     Assessment and Plan:   Acute kidney injury secondary to volume depletion Continue  IV fluid resuscitation Dehydration improved Renal function was normal a month ago however her creatinine went up to 2.3 on presentation Monitor renal function closely  Generalized weakness/deconditioning PT OT on board and recommending SNF   Leukocytosis with no signs of infection Urinalysis normal as well as chest x-ray reviewed that did not show any infiltrate Continue to monitor closely no antibiotics indicated at this time Given some abdominal pain I will repeat CT abdomen to rule out any acute pathology   Hyperkalemia:-Improved - May be related to dehydration, she is also on TPN - There is no EKG changes - Will continue to  monitor    Metastatic endometrial cancer with gastric outlet obstruction s/p PEG tube Continue TPN Patient may take ice chips - Manage as per oncology at outpatient     Malnutrition due to unable to eat due to gastric outlet obstruction - Continue TPN for nutrition and dietitian recommendations  I discussed with IR as patient is due for follow up after PEG tube placement and they will see patient.   Hypothyroidism Continue thyroxine IV   .  HFpEF - Does not appear to be fluid overloaded - Will continue to monitor   GERD - continue Protonix  IV      DVT prophylaxis: Lovenox    Code Status: Full Code   Family Communication: Not available but she wants her niece to make decision   Disposition Plan: Home versus rehab   Consults called: None   Subjective:  Patient seen and examined at bedside this morning Patient tells me her generalized weakness is improving Chronic leukocytosis follow up abd CT requested IV fluid continue She denied any acute overnight events   Physical Exam: \Constitutional: Alert, awake, calm, comfortable HEENT: Neck supple Respiratory: Clear to auscultation B/L, no wheezing, no rales.  Cardiovascular: Regular rate and rhythm, no murmurs / rubs / gallops. No extremity edema. 2+ pedal pulses. No carotid bruits.  Abdomen: Soft, no tenderness, PEG tube in place Musculoskeletal: no clubbing / cyanosis. Good ROM, no contractures. Normal muscle tone.  Skin: no rashes, lesions, ulcers. Neurologic: CN 2-12 grossly intact. Sensation intact, No focal deficit identified Psychiatric: Alert and oriented x 3. Normal mood.     Data Reviewed:   Vitals:   03/12/24 0405  03/12/24 0500 03/12/24 0735 03/12/24 1450  BP: 96/69  100/71 96/66  Pulse: (!) 102  99   Resp:   19 20  Temp: 98.6 F (37 C)  98.1 F (36.7 C) (!) 97.4 F (36.3 C)  TempSrc: Oral     SpO2: 96%  100% 100%  Weight:  92.2 kg    Height:          Latest Ref Rng & Units 03/12/2024    5:00 AM  03/11/2024    9:38 AM 03/10/2024    5:12 AM  CBC  WBC 4.0 - 10.5 K/uL 23.8  23.4  21.0   Hemoglobin 12.0 - 15.0 g/dL 89.5  88.9  89.6   Hematocrit 36.0 - 46.0 % 30.4  32.6  29.6   Platelets 150 - 400 K/uL 176  184  194        Latest Ref Rng & Units 03/12/2024    5:00 AM 03/11/2024    9:38 AM 03/10/2024    5:12 AM  BMP  Glucose 70 - 99 mg/dL 851  876  91   BUN 6 - 20 mg/dL 888  871  875   Creatinine 0.44 - 1.00 mg/dL 7.50  7.53  7.67   Sodium 135 - 145 mmol/L 138  134  136   Potassium 3.5 - 5.1 mmol/L 3.5  5.0  4.8   Chloride 98 - 111 mmol/L 105  103  102   CO2 22 - 32 mmol/L 15  18  20    Calcium  8.9 - 10.3 mg/dL 8.4  8.3  8.4      Author: [name], MD 03/12/2024 3:06 PM  For on call review [email or web address].

## 2024-03-12 NOTE — Consult Note (Signed)
 PHARMACY - TOTAL PARENTERAL NUTRITION CONSULT NOTE   Indication: Small bowel obstruction  Patient Measurements: Height: 5' 4 (162.6 cm) Weight: 92.2 kg (203 lb 4.2 oz) IBW/kg (Calculated) : 54.7 TPN AdjBW (KG): 62.3 Body mass index is 34.89 kg/m. Usual Weight: N/A  Assessment:  58 y.o. 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 Patient discharged yesterday with plans to follow up with oncology, with new chemotherapy regimen set to initiate 7/22. Patient now back today after weakness with fall at home, now with AKI.  Glucose / Insulin : no history of DM BG 116.  Dexamethasone  taper, 10 mg decreased to 6 mg with most recent dose 7/17 Insulin  requirements the last 24h: 7 > 12 Insulin  8 units in bag.  Electrolytes: K 5.6 > 4.8 > 5 > 3.5 Renal: Scr 2.33>2.46>2.49 Baseline 0.6-0.8 Phos 3.7 (7/17) > 5.0 > 5.8 (7/21) Hepatic: AST slightly elevated at 43 > 50 TG 356 (7/14) > 184 > 202 (7/21) Intake / Output: History of heart failure mIVF NS @ 100 cc/hr  GI Imaging: US  renal 7/17: Mild left hydronephrosis, unchanged from 7/9  CT abdomen/pelvis 7/11:  1. Interval marked fluid distension of the stomach with irregular wall thickening of the antral pyloric region with mucosal enhancement with otherwise decompressed appearance of the bowel, outlet obstruction or functional obstruction of the stomach cannot be excluded. NG decompression is recommended 2. Slight increased ascites within the abdomen and pelvis, now small-moderate volume. No free air. 3. New mild left-sided hydronephrosis and hydroureter, with more normal appearing caliber of distal left ureter in the pelvis, anterior to the iliac vessels. No obstructing stone. Findings are consistent with distal ureteral obstruction. Decreased excretion of contrast from the left  kidney on delayed views. 4. Nodular soft tissue thickening at the right posterior bladder, stable in the short interim but suspicious for neoplasm/metastatic disease. 5. Extensive abdominal, retroperitoneal and pelvic adenopathy consistent with metastatic disease. 6. 6 mm right lower lobe pulmonary nodule, stable, and also concerning for metastatic disease. 7. Aortic atherosclerosis.  GI Surgeries / Procedures:  EGD done 7/10 7/14: PERCUTANEOUS (VENTING) G-TUBE PLACEMENT (3600 ml of fluid removed)  Central access: 7/11 TPN start date: 7/11  Nutritional Goals: Goal TPN rate 60 mL/hr (provides 102 g of protein and 1906 kcals per day)  7/15: Trying to keep fluid low due to HFpEF and ascites. Provider wants fluid increase due to bump in Scr 7/16: Dr Caleen and Augustin (clinical dietitian) agreed on keeping TPN rate at 59ml/hr.  RD Assessment: Consulted. Most recent note 7/16 - patient remains at high refeed risk. Total Energy Estimated Needs: 2000-2300kcal/day Total Protein Estimated Needs: 100-115g/day Total Fluid Estimated Needs: 1.4-1.6L/day  Current Nutrition: NPO  Plan:  Continue TPN rate at 60 mL/hr  Electrolytes in TPN: Na 61mEq/L, K 20 > 15 > 0 > 20 mEq/L (increased 7/21), Ca 93mEq/L, Mg 14mEq/L (added back 7/17), and Phos 20 > 10 > 5 mmol/L (decreased again 7/21) Cl:Ac 1:2 Add standard MVI and trace elements to TPN. Continue Resistant q6h SSI and insulin  8 > 10 units of regular insulin  on TPN bag. Will adjust as dexamethasone  taper adjusted. Monitor TPN labs on Mon/Thurs   Will M. Lenon, PharmD Clinical Pharmacist 03/12/2024 9:09 AM

## 2024-03-12 NOTE — Plan of Care (Signed)
  Problem: Clinical Measurements: Goal: Ability to maintain clinical measurements within normal limits will improve Outcome: Progressing   Problem: Clinical Measurements: Goal: Diagnostic test results will improve Outcome: Progressing   Problem: Coping: Goal: Level of anxiety will decrease Outcome: Progressing   Problem: Elimination: Goal: Will not experience complications related to bowel motility Outcome: Progressing   Problem: Pain Managment: Goal: General experience of comfort will improve and/or be controlled Outcome: Progressing

## 2024-03-13 ENCOUNTER — Ambulatory Visit: Admitting: Nurse Practitioner

## 2024-03-13 ENCOUNTER — Ambulatory Visit

## 2024-03-13 ENCOUNTER — Inpatient Hospital Stay

## 2024-03-13 ENCOUNTER — Other Ambulatory Visit

## 2024-03-13 DIAGNOSIS — R29898 Other symptoms and signs involving the musculoskeletal system: Secondary | ICD-10-CM | POA: Diagnosis not present

## 2024-03-13 DIAGNOSIS — Z515 Encounter for palliative care: Secondary | ICD-10-CM

## 2024-03-13 DIAGNOSIS — C541 Malignant neoplasm of endometrium: Secondary | ICD-10-CM | POA: Diagnosis not present

## 2024-03-13 DIAGNOSIS — K3189 Other diseases of stomach and duodenum: Secondary | ICD-10-CM | POA: Diagnosis not present

## 2024-03-13 DIAGNOSIS — E860 Dehydration: Secondary | ICD-10-CM | POA: Diagnosis not present

## 2024-03-13 LAB — CBC WITH DIFFERENTIAL/PLATELET
Abs Immature Granulocytes: 0.45 K/uL — ABNORMAL HIGH (ref 0.00–0.07)
Basophils Absolute: 0.1 K/uL (ref 0.0–0.1)
Basophils Relative: 0 %
Eosinophils Absolute: 0 K/uL (ref 0.0–0.5)
Eosinophils Relative: 0 %
HCT: 33.8 % — ABNORMAL LOW (ref 36.0–46.0)
Hemoglobin: 11.1 g/dL — ABNORMAL LOW (ref 12.0–15.0)
Immature Granulocytes: 2 %
Lymphocytes Relative: 2 %
Lymphs Abs: 0.5 K/uL — ABNORMAL LOW (ref 0.7–4.0)
MCH: 32.7 pg (ref 26.0–34.0)
MCHC: 32.8 g/dL (ref 30.0–36.0)
MCV: 99.7 fL (ref 80.0–100.0)
Monocytes Absolute: 1 K/uL (ref 0.1–1.0)
Monocytes Relative: 4 %
Neutro Abs: 22.6 K/uL — ABNORMAL HIGH (ref 1.7–7.7)
Neutrophils Relative %: 92 %
Platelets: 179 K/uL (ref 150–400)
RBC: 3.39 MIL/uL — ABNORMAL LOW (ref 3.87–5.11)
RDW: 17.6 % — ABNORMAL HIGH (ref 11.5–15.5)
WBC: 24.6 K/uL — ABNORMAL HIGH (ref 4.0–10.5)
nRBC: 0 % (ref 0.0–0.2)

## 2024-03-13 LAB — GLUCOSE, CAPILLARY
Glucose-Capillary: 110 mg/dL — ABNORMAL HIGH (ref 70–99)
Glucose-Capillary: 118 mg/dL — ABNORMAL HIGH (ref 70–99)
Glucose-Capillary: 125 mg/dL — ABNORMAL HIGH (ref 70–99)
Glucose-Capillary: 128 mg/dL — ABNORMAL HIGH (ref 70–99)
Glucose-Capillary: 159 mg/dL — ABNORMAL HIGH (ref 70–99)
Glucose-Capillary: 159 mg/dL — ABNORMAL HIGH (ref 70–99)

## 2024-03-13 LAB — BASIC METABOLIC PANEL WITH GFR
Anion gap: 16 — ABNORMAL HIGH (ref 5–15)
BUN: 149 mg/dL — ABNORMAL HIGH (ref 6–20)
CO2: 16 mmol/L — ABNORMAL LOW (ref 22–32)
Calcium: 8.5 mg/dL — ABNORMAL LOW (ref 8.9–10.3)
Chloride: 107 mmol/L (ref 98–111)
Creatinine, Ser: 2.61 mg/dL — ABNORMAL HIGH (ref 0.44–1.00)
GFR, Estimated: 21 mL/min — ABNORMAL LOW (ref >60)
Glucose, Bld: 114 mg/dL — ABNORMAL HIGH (ref 70–99)
Potassium: 4.1 mmol/L (ref 3.5–5.1)
Sodium: 139 mmol/L (ref 135–145)

## 2024-03-13 MED ORDER — SODIUM BICARBONATE 8.4 % IV SOLN
INTRAVENOUS | Status: DC
  Filled 2024-03-13 (×3): qty 1000
  Filled 2024-03-13 (×3): qty 150

## 2024-03-13 MED ORDER — TRACE MINERALS CU-MN-SE-ZN 300-55-60-3000 MCG/ML IV SOLN
INTRAVENOUS | Status: AC
  Filled 2024-03-13: qty 681.6

## 2024-03-13 NOTE — Progress Notes (Signed)
 Occupational Therapy Treatment Patient Details Name: Tonya [name] MRN: [id number] DOB: [birth year] Today's Date: 03/13/2024   History of present illness 58 y/o female presented to ED on 03/09/24 for generalized weakness and fall. Recent hospitalization 7/9-7/17 for gastric mass s/p PEG tube. Admitted for dehydration. 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   OT comments  Chart reviewed to date, pt greeted in bed. Palliative NP in room prior to session, pt agreeable to OT/PT co tx with encouragement. Tx session targeted improving functional activity tolerance in prep for ADL tasks. BP monitored prior to mobility and MAP is >65. Pt reports SOB with any mobility, pulse ox used for biofeedback with spo2 >95% on RA throughout. MOD-MAX A required for bed mobility via log roll two attempts. Pt tolerates sitting on edge of bed for approx 10 seconds. Pt participates in bed level therex with PT. Pt is making  progress towards goals, discharge recommendation remains appropriate. OT will continue to follow.      If plan is discharge home, recommend the following:  A lot of help with walking and/or transfers;A lot of help with bathing/dressing/bathroom;Assistance with cooking/housework;Assist for transportation;Help with stairs or ramp for entrance   Equipment Recommendations  Other (comment) (defer to next venue of care)    Recommendations for Other Services      Precautions / Restrictions Precautions Precautions: Fall Recall of Precautions/Restrictions: Intact Restrictions Weight Bearing Restrictions Per Provider Order: No       Mobility Bed Mobility Overal bed mobility: Needs Assistance Bed Mobility: Rolling, Sit to Sidelying, Sidelying to Sit Rolling: Mod assist, Max assist, +2 for physical assistance Sidelying to sit: Mod assist, Max assist, +2 for physical assistance, +2 for safety/equipment, HOB elevated (HOB elevated 1  attempt, flat 1 attempt)     Sit to sidelying: Mod assist, Max assist, +2 for physical assistance, +2 for safety/equipment      Transfers                         Balance Overall balance assessment: Needs assistance Sitting-balance support: Feet supported, Single extremity supported Sitting balance-Leahy Scale: Fair Sitting balance - Comments: tolerates approx 10 seconds before requiring return to supine                                   ADL either performed or assessed with clinical judgement   ADL Overall ADL's : Needs assistance/impaired                     Lower Body Dressing: Maximal assistance                      Extremity/Trunk Assessment              Vision       Perception     Praxis     Communication Communication Communication: No apparent difficulties   Cognition Arousal: Alert Behavior During Therapy: WFL for tasks assessed/performed, Anxious Cognition: No apparent impairments                               Following commands: Intact        Cueing   Cueing Techniques: Verbal cues, Gestural cues  Exercises Other Exercises Other Exercises: edu re: role of OT, role of rehab, body  mechanics for progressing mobility, energy conservation techniques    Shoulder Instructions       General Comments peg intact pre/post session; pt dressing appears saturated, nurse aware; towel placed over peg during mobility and towel also appears saturated; nurse notified; pt spo2>95% on RA throughout    Pertinent Vitals/ Pain       Pain Assessment Pain Assessment: Faces Faces Pain Scale: Hurts even more Pain Location: abdominal Pain Descriptors / Indicators: Aching, Discomfort Pain Intervention(s): Monitored during session, Repositioned  Home Living                                          Prior Functioning/Environment              Frequency  Min 2X/week        Progress  Toward Goals  OT Goals(current goals can now be found in the care plan section)  Progress towards OT goals: Progressing toward goals  Acute Rehab OT Goals Time For Goal Achievement: 03/24/24  Plan      Co-evaluation    PT/OT/SLP Co-Evaluation/Treatment: Yes Reason for Co-Treatment: To address functional/ADL transfers;For patient/therapist safety;Other (comment) (tolerance)   OT goals addressed during session: ADL's and self-care      AM-PAC OT 6 Clicks Daily Activity     Outcome Measure   Help from another person eating meals?: None Help from another person taking care of personal grooming?: A Little Help from another person toileting, which includes using toliet, bedpan, or urinal?: A Lot Help from another person bathing (including washing, rinsing, drying)?: A Lot Help from another person to put on and taking off regular upper body clothing?: A Little Help from another person to put on and taking off regular lower body clothing?: A Lot 6 Click Score: 16    End of Session    OT Visit Diagnosis: Unsteadiness on feet (R26.81);Other abnormalities of gait and mobility (R26.89);History of falling (Z91.81);Muscle weakness (generalized) (M62.81)   Activity Tolerance Patient limited by fatigue   Patient Left in bed;with call bell/phone within reach;with bed alarm set   Nurse Communication Mobility status        Time: 8571-8542 OT Time Calculation (min): 29 min  Charges: OT General Charges $OT Visit: 1 Visit OT Treatments $Therapeutic Activity: 8-22 mins  Therisa Sheffield, OTD OTR/L  03/13/24, 4:13 PM

## 2024-03-13 NOTE — Progress Notes (Signed)
 Nutrition Follow Up Note   DOCUMENTATION CODES:   Severe malnutrition in context of chronic illness  INTERVENTION:   Continue TPN per pharmacy- provides 1906kcal/day and 102g/day of protein  Daily weights   NUTRITION DIAGNOSIS:   Severe Malnutrition related to chronic illness as evidenced by moderate fat depletion, moderate muscle depletion, 36 percent weight loss in 10 months. -ongoing   GOAL:   Patient will meet greater than or equal to 90% of their needs -met   MONITOR:   Labs, Weight trends, Skin, I & O's, Other (Comment) (TPN)  ASSESSMENT:   58 y/o female with h/o stage IV metastatic endometrial cancer on chemo since 2/3, hypothyroid disease, ischemic cardiomyopathy, HTN, CAD, C-diff, compression fracture, HLD, GERD, MI, CHF, SIADH and recent admission for GOO secondary to metastatic disease from large gastric & duodenal masses s/p 19F venting G-tube placement 7/14 and new TPN initiation who is admitted with weakness and FTT.  Pt tolerating TPN at goal rate. Insulin  added in TPN. Triglycerides elevated but in acceptable range. Pt with worsening AKI and metabolic acidosis. Pt receiving sodium bicarbonate . Acetate increased in TPN. Nephrology following. Pt's main complaint continues to be weakness. Pt remains NPO. G-tube in place for venting; apparently tube was leaking but was adjusted by IR today. Per chart, pt is up ~16lbs from her UBW. Pt noted to have increasing bilateral pleural effusions and ascites on CT scan yesterday. Pt also noted to have possible bladder malignancy. Palliative care is following.    Medications reviewed and include: lovenox , insulin , synthroid , protonix , simethicone , Na bicarbonate, TPN  Labs reviewed: K 4.1 wnl, BUN 149(H), creat 2.61(H) P 5.8(H), Mg 1.9 wnl- 7/21 Triglycerides- 202- 7/21 Wbc- 24.6(H), Hgb 11.1(L), Hct 33.8(L) Cbgs- 128, 118, 110, 125 x 24 hrs  G-tube output- (noted pt does drink water  and eat ice chips)  Nutrition  Focused Physical Exam:  Flowsheet Row Most Recent Value  Orbital Region No depletion  Upper Arm Region Moderate depletion  Thoracic and Lumbar Region Mild depletion  Buccal Region No depletion  Temple Region No depletion  Clavicle and Acromion Bone Region Moderate depletion  Scapular Bone Region No depletion  Dorsal Hand Moderate depletion  Patellar Region Mild depletion  Anterior Thigh Region Moderate depletion  Posterior Calf Region Moderate depletion  Edema (RD Assessment) None  Hair Reviewed  Eyes Reviewed  Mouth Reviewed  Skin Reviewed  Nails Reviewed   Diet Order:   Diet Order             Diet NPO time specified Except for: Ice Chips  Diet effective now                  EDUCATION NEEDS:   No education needs have been identified at this time  Skin:  Skin Assessment: Reviewed RN Assessment  Last BM:  7/21  Height:   Ht Readings from Last 1 Encounters:  03/09/24 5' 4 (1.626 m)    Weight:   Wt Readings from Last 1 Encounters:  03/13/24 92.4 kg    Ideal Body Weight:  54.5 kg  BMI:  Body mass index is 34.97 kg/m.  Estimated Nutritional Needs:   Kcal:  2000-2300kcal/day  Protein:  100-115g/day  Fluid:  1.4-1.6L/day  [name], RD, [name] If unable to be reached, please send secure chat to RD inpatient available from 8:00a-4:00p daily

## 2024-03-13 NOTE — Progress Notes (Signed)
 Referring Provider(s): Dr. Cort Mana, MD   Supervising Physician: Jenna Hacker  Patient Status:  [facility] - In-pt  Chief Complaint: Intractable nausea and vomiting; weakness;   Brief history:  Patient presented to the ED 02/29/24 with persistent nausea and vomiting over the last 2 weeks. CT of the abdomen pelvis showed marked fluid distention of the stomach which may represent gastric outlet obstruction versus functional obstruction. An NG tube was placed for decompression. She underwent EGD 03/01/24 which revealed a large gastric mass oozing blood and a separate pyloric mass going into the duodenum and was obstructing. After discussions with surgery, IR, and hospitalist agreed on placement of a venting G-tube for symptomatic relief. Patient received the venting G-tube on 7/14. Patient was subsequently discharged home on TPN through PICC line.  Subjective:  Patient is alert, and laying in bed, calm.  Currently without any significant complaints. She is bothered by her G-tube leaking as much as it has. Patient denies any fevers, headache, chest pain, SOB, cough, nausea, or vomiting.  Allergies: Patient has no known allergies.  Medications: Prior to Admission medications   Medication Sig Start Date End Date Taking? Authorizing Provider  bisacodyl  (DULCOLAX) 10 MG suppository Place 1 suppository (10 mg total) rectally daily as needed for moderate constipation. 03/08/24  Yes Caleen Qualia, MD  levothyroxine  (SYNTHROID , LEVOTHROID) 100 MCG/5ML SOLN injection Inject 10 mLs (200 mcg total) into the vein in the morning. 03/09/24  Yes Caleen Qualia, MD  ondansetron  (ZOFRAN ) 4 MG/2ML SOLN injection Inject 2 mLs (4 mg total) into the vein every 6 (six) hours as needed for nausea or vomiting. 03/08/24  Yes Caleen Qualia, MD  pantoprazole  (PROTONIX ) 40 MG injection Inject 40 mg into the vein daily. 03/08/24  Yes Caleen Qualia, MD     Vital Signs: BP (!) 88/64 (BP Location: Left Arm)   Pulse 99    Temp (!) 97.4 F (36.3 C)   Resp 19   Ht 5' 4 (1.626 m)   Wt 203 lb 11.3 oz (92.4 kg)   LMP 07/22/2016   SpO2 99%   BMI 34.97 kg/m   Physical Exam Constitutional:      Appearance: Normal appearance.  Cardiovascular:     Rate and Rhythm: Normal rate.  Pulmonary:     Effort: Pulmonary effort is normal.  Abdominal:     General: Abdomen is flat.     Palpations: Abdomen is soft.     Tenderness: There is abdominal tenderness.     Comments: Minimal tenderness to palpation around G-tube insertion site. Large amounts of clear gastric secretions leaking from around G-tube, soaking through a towel. Two T-tacks in place.  Musculoskeletal:        General: Normal range of motion.  Skin:    General: Skin is warm and dry.  Neurological:     Mental Status: She is alert and oriented to person, place, and time.  Psychiatric:        Mood and Affect: Mood normal.        Behavior: Behavior normal.        Thought Content: Thought content normal.        Judgment: Judgment normal.      Labs:  CBC: Recent Labs    03/10/24 0512 03/11/24 0938 03/12/24 0500 03/13/24 0441  WBC 21.0* 23.4* 23.8* 24.6*  HGB 10.3* 11.0* 10.4* 11.1*  HCT 29.6* 32.6* 30.4* 33.8*  PLT 194 184 176 179    COAGS: Recent Labs    03/05/24 0610  03/10/24 0512  INR 1.0 1.0    BMP: Recent Labs    03/10/24 0512 03/11/24 0938 03/12/24 0500 03/13/24 0441  NA 136 134* 138 139  K 4.8 5.0 3.5 4.1  CL 102 103 105 107  CO2 20* 18* 15* 16*  GLUCOSE 91 123* 148* 114*  BUN 124* 128* 111* 149*  CALCIUM  8.4* 8.3* 8.4* 8.5*  CREATININE 2.32* 2.46* 2.49* 2.61*  GFRNONAA 24* 22* 22* 21*    LIVER FUNCTION TESTS: Recent Labs    03/05/24 0610 03/08/24 0520 03/09/24 1119 03/10/24 0512  BILITOT 0.4 0.6 0.5 0.7  AST 31 43* 43* 50*  ALT 8 8 8 10   ALKPHOS 56 78 90 90  PROT 5.8* 5.7* 6.0* 5.5*  ALBUMIN  2.5* 2.2* 2.3* 2.0*    Assessment and Plan:  Tenderness remains appropriate for phase of healing. G-tube  appropriately in place flushes well. Both T-tacks fell out spontaneously with minimal manipulation. Venting tubing and bag was replaced, with immediate drainage of fluid resuming into collection bag.  Interim:  Patient's leaking dramatically decreased after exchange of venting tubing. However, after PT/ activity, patient resumed leaking large volumes from around G-tube. Order for intermittent wall suction placed. IR recommends ensuring suction is connected prior to any further activity/ PT.  Please reach out to IR with any further concerns or questions. IR will continue to follow.    Thank you for this interesting consult.  I greatly enjoyed meeting Tonya [name] and look forward to participating in their care.   Electronically Signed: Carlin DELENA Griffon, PA-C 03/13/2024, 2:38 PM     I spent a total of 15 Minutes at the the patient's bedside AND on the patient's hospital floor or unit, greater than 50% of which was counseling/coordinating care for gastrostomy tube care.

## 2024-03-13 NOTE — Consult Note (Addendum)
 PHARMACY - TOTAL PARENTERAL NUTRITION CONSULT NOTE   Indication: Small bowel obstruction  Patient Measurements: Height: 5' 4 (162.6 cm) Weight: 92.4 kg (203 lb 11.3 oz) IBW/kg (Calculated) : 54.7 TPN AdjBW (KG): 62.3 Body mass index is 34.97 kg/m. Usual Weight: N/A  Assessment:  58 y.o. 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 Patient discharged yesterday with plans to follow up with oncology, with new chemotherapy regimen set to initiate 7/22. Patient now back today after weakness with fall at home, now with AKI.  Update 7/22: AKI continues to worsen. Nephrology consulted.  Glucose / Insulin : no history of DM BG 116. Dexamethasone  completed Insulin  requirements the last 24h: 7 > 12 > 3 units Electrolytes: K 5.6 > 4.8 > 5 > 3.5 > 4.1 Renal: Scr 2.33>2.46>2.49>2.61 Baseline 0.6-0.8 Phos 3.7 (7/17) > 5.0 > 5.8 (7/21) Hepatic: AST slightly elevated at 43 > 50 TG 356 (7/14) > 184 > 202 (7/21) Intake / Output: History of heart failure mIVF NS @ 125 cc/hr  GI Imaging: US  renal 7/17: Mild left hydronephrosis, unchanged from 7/9  CT abdomen/pelvis 7/11:  1. Interval marked fluid distension of the stomach with irregular wall thickening of the antral pyloric region with mucosal enhancement with otherwise decompressed appearance of the bowel, outlet obstruction or functional obstruction of the stomach cannot be excluded. NG decompression is recommended 2. Slight increased ascites within the abdomen and pelvis, now small-moderate volume. No free air. 3. New mild left-sided hydronephrosis and hydroureter, with more normal appearing caliber of distal left ureter in the pelvis, anterior to the iliac vessels. No obstructing stone. Findings are consistent with distal ureteral obstruction. Decreased excretion of contrast from the  left kidney on delayed views. 4. Nodular soft tissue thickening at the right posterior bladder, stable in the short interim but suspicious for neoplasm/metastatic disease. 5. Extensive abdominal, retroperitoneal and pelvic adenopathy consistent with metastatic disease. 6. 6 mm right lower lobe pulmonary nodule, stable, and also concerning for metastatic disease. 7. Aortic atherosclerosis.  GI Surgeries / Procedures:  EGD done 7/10 7/14: PERCUTANEOUS (VENTING) G-TUBE PLACEMENT (3600 ml of fluid removed)  Central access: 7/11 TPN start date: 7/11  Nutritional Goals: Goal TPN rate 60 mL/hr (provides 102 g of protein and 1906 kcals per day)  7/15: Trying to keep fluid low due to HFpEF and ascites. Provider wants fluid increase due to bump in Scr 7/16: Dr Caleen and Augustin (clinical dietitian) agreed on keeping TPN rate at 51ml/hr.  RD Assessment: Consulted. Most recent note 7/19 - patient remains at high refeed risk. Total Energy Estimated Needs: 2000-2300kcal/day Total Protein Estimated Needs: 100-115g/day Total Fluid Estimated Needs: 1.4-1.6L/day  Current Nutrition: NPO  Plan:  Continue TPN rate at 60 mL/hr  Electrolytes in TPN: Na 50mEq/L, K 20 mEq/L (increased 7/21), Ca 65mEq/L, Mg 69mEq/L (added back 7/17), and Phos 5 mmol/L (decreased 7/21) Cl:Ac 1:2 Add standard MVI and trace elements to TPN. Continue Resistant q6h SSI but discontinue insulin  in TPN. Monitor TPN labs on Mon/Thurs. Will check an additional Phos tomorrow.   Will M. Lenon, PharmD Clinical Pharmacist 03/13/2024 8:03 AM

## 2024-03-13 NOTE — Consult Note (Signed)
 Consultation Note Date: 03/13/2024 at 1430  Patient Name: Tonya [name]  DOB: 1966-06-08  MRN: [id number]  Age / Sex: 58 y.o., female  PCP: Cleotilde Oneil FALCON, MD Referring Physician: Dorinda Drue DASEN, MD  HPI/Patient Profile: 58 y.o. 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   Patient is being treated for generalized weakness and deconditioning, hypokalemia, metastatic endometrial cancer with gastric outlet obstruction s/p PEG tube, malnutrition, AKI, and hypothyroidism.  PMT was consulted to support patient with goals of care discussions.  Of note, patient is familiar to PMT/this provider as I met with patient during her previous hospitalization.  Clinical Assessment and Goals of Care: Extensive chart review completed prior to meeting patient including labs, vital signs, imaging, progress notes, orders, and available advanced directive documents from current and previous encounters. I then met with patient at bedside to discuss diagnosis prognosis, GOC, EOL wishes, disposition and options.  I re-introduced Palliative Medicine as specialized medical care for people living with serious illness. It focuses on providing relief from the symptoms and stress of a serious illness. The goal is to improve quality of life for both the patient and the family.  We discussed the events that transpired after discharging on 7/17 and returning to the hospital on 7/18.  Patient shares that she was too weak to perform ADLs at home even with the assistance of her  husband.  She shares that it did not go well.  As far as functional and nutritional status, patient endorses she is weak and unable to transfer without extensive assistance.  Additionally, she shares her PEG tube has been leaking with physical movement.  We discussed patient's current illness-deconditioning and generalized weakness-and what it means in the larger context of patient's on-going co-morbidities-metastatic cancer and ongoing nutrition.    I attempted to elicit values and goals of care important to the patient.  Patient remains accepting of all offered, appropriate, and available medical interventions to sustain her life.  She wishes to continue with current plan of care.  Additionally, patient shares she is open to going to a short-term rehab if appropriate.  She wishes to speak with her husband to know what financial and medical questions to ask when considering STR.  Education provided on short-term rehab at SNF versus at home therapy versus assisted living facility versus LTC.  Patient was appreciative of this discussion.   During our visit, OT and PT came in for a joint session.  Initially, patient shares that she is not up for at this time.  However, discussed importance of patient's participation in therapies to show she is able to be rehabbed and appropriate for STR placement.  Patient agreeable to work with PT/OT.  No  adjustment to plan of care at this time.  Full code/scope remain.  PMT will continue to follow and support.  Primary Decision Maker PATIENT  Physical Exam Constitutional:      General: She is not in acute distress.    Appearance: She is obese.  HENT:     Head: Normocephalic.     Mouth/Throat:     Mouth: Mucous membranes are moist.  Eyes:     Pupils: Pupils are equal, round, and reactive to light.  Musculoskeletal:     Comments: Generalized weakness  Skin:    General: Skin is warm and dry.     Coloration: Skin is pale.     Comments: UTA PEG site   Neurological:     Mental Status: She is alert and oriented to person, place, and time.  Psychiatric:        Mood and Affect: Mood normal.        Behavior: Behavior normal.     Palliative Assessment/Data: 50%     Thank you for this consult. Palliative medicine will continue to follow and assist holistically.   Time Total: 75 minutes  Time spent includes: Detailed review of medical records (labs, imaging, vital signs), medically appropriate exam (mental status, respiratory, cardiac, skin), discussed with treatment team, counseling and educating patient, family and staff, documenting clinical information, medication management and coordination of care.  Signed by: Lamarr Gunner, DNP, FNP-BC Palliative Medicine   Please contact Palliative Medicine Team providers via [facility] for questions and concerns.

## 2024-03-13 NOTE — [place] FL2 (Signed)
 [place]  MEDICAID FL2 LEVEL OF CARE FORM     IDENTIFICATION  Patient Name: Tonya [name] Birthdate: 20-Mar-1966 Sex: female Admission Date (Current Location): 03/09/2024  [facility] and [place] Number:  Chiropodist and Address:  [facility], [address] [place], [place] [address]      Provider Number: [id number]  Attending Physician Name and Address:  Dorinda Drue DASEN, MD  Relative Name and Phone Number:  Jaidyn Usery [phone]    Current Level of Care: Hospital Recommended Level of Care: Skilled Nursing Facility Prior Approval Number:    Date Approved/Denied:   PASRR Number: [id number]  Discharge Plan: SNF    Current Diagnoses: Patient Active Problem List   Diagnosis Date Noted   Dehydration 03/09/2024   Muscular deconditioning 03/09/2024   Acute hyperkalemia 03/09/2024   Protein-calorie malnutrition, severe 03/03/2024   Leukocytosis 03/02/2024   Hypokalemia 03/02/2024   Chronic HFrEF (heart failure with reduced ejection fraction) (HCC) 03/02/2024   Normocytic anemia 03/02/2024   Intractable nausea and vomiting 03/01/2024   Intractable nausea 03/01/2024   Gastric mass 03/01/2024   Goals of care, counseling/discussion 02/28/2024   Neutropenia, febrile (HCC) 11/04/2023   Actinomyces infection 10/12/2023   Closed compression fracture of body of L1 vertebra (HCC) 10/11/2023   Thrombocytopenia (HCC) 10/10/2023   Bacteremia due to Streptococcus 10/05/2023   Hypophosphatemia 10/04/2023   AKI (acute kidney injury) (HCC) 10/03/2023   Chronic hyponatremia 10/03/2023   Metabolic acidosis 10/03/2023   Neutropenia (HCC) 10/03/2023   C. 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) 09/21/2023   Endometrial carcinoma (HCC) 09/21/2023   Malignant neoplasm of uterus (HCC) 09/01/2023   B12 deficiency 05/25/2023    Vitamin D deficiency 05/25/2023   Coronary artery disease involving native coronary artery of native heart with angina pectoris (HCC) 07/24/2022   Type 2 diabetes mellitus with peripheral angiopathy (HCC) 05/18/2022   Ischemic cardiomyopathy 08/09/2019   Essential hypertension 08/09/2019   Hyperlipidemia associated with type 2 diabetes mellitus (HCC) 08/09/2019   Morbid obesity (HCC) 08/09/2019   Diabetes mellitus (HCC) 08/09/2019   NSTEMI (non-ST elevated myocardial infarction) (HCC) 07/23/2019   Hypothyroidism 07/23/2019    Orientation RESPIRATION BLADDER Height & Weight     Self, Time, Situation    Incontinent Weight: 92.4 kg Height:  5' 4 (162.6 cm)  BEHAVIORAL SYMPTOMS/MOOD NEUROLOGICAL BOWEL NUTRITION STATUS      Incontinent    AMBULATORY STATUS COMMUNICATION OF NEEDS Skin   Limited Assist Verbally                         Personal Care Assistance Level of Assistance  Bathing, Feeding, Dressing Bathing Assistance: Limited assistance Feeding assistance: Independent Dressing Assistance: Limited assistance     Functional Limitations Info  Hearing, Speech, Sight          SPECIAL CARE FACTORS FREQUENCY                       Contractures      Additional Factors Info  Code Status, Allergies Code Status Info: FULL Allergies Info: NKDA           Current Medications (03/13/2024):  This is the current hospital active medication list Current Facility-Administered Medications  Medication Dose Route Frequency Provider Last Rate Last Admin   0.9 %  sodium chloride  infusion   Intravenous  Continuous Dorinda Drue DASEN, MD 125 mL/hr at 03/13/24 0129 New Bag at 03/13/24 0129   acetaminophen  (TYLENOL ) tablet 650 mg  650 mg Per Tube Q6H PRN Paudel, Keshab, MD       Or   acetaminophen  (TYLENOL ) suppository 650 mg  650 mg Rectal Q6H PRN Paudel, Keshab, MD       bisacodyl  (DULCOLAX) suppository 10 mg  10 mg Rectal Daily PRN Paudel, Keshab, MD   10 mg at 03/11/24 1112    Chlorhexidine  Gluconate Cloth 2 % PADS 6 each  6 each Topical Daily Paudel, Keshab, MD   6 each at 03/12/24 0819   enoxaparin  (LOVENOX ) injection 30 mg  30 mg Subcutaneous Q24H Paudel, Nena, MD   30 mg at 03/12/24 2103   insulin  aspart (novoLOG ) injection 0-20 Units  0-20 Units Subcutaneous Q6H Lenon Elsie HERO, [facility]   3 Units at 03/13/24 0100   levothyroxine  (SYNTHROID , LEVOTHROID) injection 200 mcg  200 mcg Intravenous q AM Paudel, Keshab, MD   200 mcg at 03/13/24 0525   ondansetron  (ZOFRAN ) tablet 4 mg  4 mg Per Tube Q6H PRN Roann Nena, MD       Or   ondansetron  (ZOFRAN ) injection 4 mg  4 mg Intravenous Q6H PRN Paudel, Keshab, MD       pantoprazole  (PROTONIX ) injection 40 mg  40 mg Intravenous Daily Paudel, Nena, MD   40 mg at 03/13/24 0805   simethicone  (MYLICON) 40 MG/0.6ML suspension 40 mg  40 mg Per Tube BID Dorinda Drue T, MD   40 mg at 03/12/24 2103   sodium chloride  flush (NS) 0.9 % injection 10-40 mL  10-40 mL Intracatheter Q12H Paudel, Keshab, MD   10 mL at 03/13/24 0806   sodium chloride  flush (NS) 0.9 % injection 10-40 mL  10-40 mL Intracatheter PRN Roann Nena, MD       TPN ADULT (ION)   Intravenous Continuous TPN Lenon Elsie HERO, RPH 60 mL/hr at 03/12/24 1802 Infusion Verify at 03/12/24 1802   TPN ADULT (ION)   Intravenous Continuous TPN Lenon Elsie HERO, [facility]         Discharge Medications: Please see discharge summary for a list of discharge medications.  Relevant Imaging Results:  Relevant Lab Results:   Additional Information [id number]  Dalia GORMAN Fuse, RN

## 2024-03-13 NOTE — Progress Notes (Signed)
 Physical Therapy Treatment Patient Details Name: Tonya [name] MRN: [id number] DOB: 1966-07-10 Today's Date: 03/13/2024   History of Present Illness 58 y/o female presented to ED on 03/09/24 for generalized weakness and fall. Recent hospitalization 7/9-7/17 for gastric mass s/p PEG tube. Admitted for dehydration. 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    PT Comments  PT/OT performed co-treatment to maximize pt/staff safety and to increase therapeutic benefit without pt's overexertion. Upon entry pt required increased motivation to perform therapy, with education on the benefits of mobility to assist with healing; after a short discussion, pt agreed to session. PT focused today's session on increasing activity tolerance and BLE strengthening to allow for increased mobility in future sessions. During session pt was able to perform BLE strengthening exercises while supine (required increased rest break between sets), performed bed mobility with MOD/MAX A x2 (for BLE and truncal management) and was able to sit on the EOB for ~10 seconds before requesting to lay back down. PT/OT provided constant reassurance of vitals, secondary to increased reports of SOB with movement (SpO2: 100% on room air throughout session). PT/OT noticed increased saturation of abdominal drainage on dressing and gown prior to and at the end of session (RN notified and made aware of pt's request for a new gown and redressing following end of session). Pt would benefit from skilled PT to continue to work towards PT goals and return to PLOF.       If plan is discharge home, recommend the following: Assist for transportation;Help with stairs or ramp for entrance;Two people to help with walking and/or transfers;Two people to help with bathing/dressing/bathroom   Can travel by private vehicle     No  Equipment Recommendations  Other (comment) (TBD)     Recommendations for Other Services       Precautions / Restrictions Precautions Precautions: Fall Recall of Precautions/Restrictions: Intact Precaution/Restrictions Comments: Abdominal drainage tube on LUQ Restrictions Weight Bearing Restrictions Per Provider Order: No     Mobility  Bed Mobility Overal bed mobility: Needs Assistance Bed Mobility: Rolling, Sit to Sidelying, Sidelying to Sit Rolling: Mod assist, Max assist, +2 for physical assistance Sidelying to sit: Mod assist, Max assist, +2 for physical assistance, +2 for safety/equipment, HOB elevated (1st attempt with flat bed, 2nd attempt with HOB elevated)     Sit to sidelying: Mod assist, Max assist, +2 for physical assistance, +2 for safety/equipment General bed mobility comments: Pt requires LE assistance to bring BLE into hip flexion to allow the pt to assist with movement    Transfers                   General transfer comment: Deferred secondary to fatigue and reports of SOB (SpO2: 100% on room air throughout session)    Ambulation/Gait               General Gait Details: Deferred secondary to fatigue and reports of SOB (SpO2: 100% on room air throughout session)        Balance Overall balance assessment: Needs assistance Sitting-balance support: Feet supported, Single extremity supported Sitting balance-Leahy Scale: Fair Sitting balance - Comments: Able to tolerate ~10 seconds of sitting EOB before requesting to lay back down                                    Communication Communication Communication: No apparent difficulties  Cognition  Arousal: Alert Behavior During Therapy: WFL for tasks assessed/performed, Anxious   PT - Cognitive impairments: No apparent impairments                         Following commands: Intact      Cueing Cueing Techniques: Verbal cues, Gestural cues  Exercises General Exercises - Lower Extremity Ankle Circles/Pumps: Supine,  AROM, Both, 10 reps Heel Slides: AAROM, Supine, Strengthening, Both, 10 reps Other Exercises Other Exercises: Glute bridges, 1x10, with PT/OT facilitating BLE flexion prior to ther ex set.    General Comments General comments (skin integrity, edema, etc.) Abdominal drain: intact pre/post session; pt dressing appears saturated, nurse aware; towel placed over drain during mobility and towel also appears saturated; nurse notified; pt SpO2: 100% on room air throughout session.      Pertinent Vitals/Pain Pain Assessment Pain Assessment: Faces Faces Pain Scale: Hurts even more Pain Location: abdominal Pain Descriptors / Indicators: Aching, Discomfort Pain Intervention(s): Limited activity within patient's tolerance, Monitored during session, Patient requesting pain meds-RN notified     PT Goals (current goals can now be found in the care plan section) Acute Rehab PT Goals Patient Stated Goal: to get stronger PT Goal Formulation: With patient/family Time For Goal Achievement: 03/24/24 Potential to Achieve Goals: Fair Progress towards PT goals: Progressing toward goals    Frequency    Min 2X/week      PT Plan      Co-evaluation PT/OT/SLP Co-Evaluation/Treatment: Yes Reason for Co-Treatment: To address functional/ADL transfers;For patient/therapist safety;Other (comment) PT goals addressed during session: Mobility/safety with mobility OT goals addressed during session: ADL's and self-care      AM-PAC PT 6 Clicks Mobility   Outcome Measure  Help needed turning from your back to your side while in a flat bed without using bedrails?: Total Help needed moving from lying on your back to sitting on the side of a flat bed without using bedrails?: Total Help needed moving to and from a bed to a chair (including a wheelchair)?: Total Help needed standing up from a chair using your arms (e.g., wheelchair or bedside chair)?: Total Help needed to walk in hospital room?: Total Help  needed climbing 3-5 steps with a railing? : Total 6 Click Score: 6    End of Session   Activity Tolerance: Patient limited by fatigue Patient left: in bed;with call bell/phone within reach;with bed alarm set Nurse Communication: Mobility status;Patient requests pain meds PT Visit Diagnosis: Unsteadiness on feet (R26.81);Muscle weakness (generalized) (M62.81);Difficulty in walking, not elsewhere classified (R26.2);History of falling (Z91.81);Other abnormalities of gait and mobility (R26.89)     Time: 8573-8542 PT Time Calculation (min) (ACUTE ONLY): 31 min  Charges:                            Dao Memmott, SPT 03/13/24, 4:59 PM

## 2024-03-13 NOTE — Consult Note (Signed)
 [facility]  CONSULT NOTE    Date: 03/13/2024                  Patient Name:  Tonya [name]  MRN: [id number]  DOB: Dec 22, 1965  Age / Sex: 58 y.o., female         PCP: Cleotilde Oneil FALCON, MD                 Service Requesting Consult: [facility]                 Reason for Consult: Acute Kidney Injruy            History of Present Illness: [name] is a 58 y.o.  female with past medical history of CAD, gastric reflux, hypertension, gastric outlet obstruction status post PEG tube, stage IV metastatic endometrial cancer, who was admitted to [facility] on 03/09/2024 for Dehydration [E86.0]  Patient presents to the emergency department complaining of weakness.  Patient states she was experiencing diarrhea for over a week.  Was unable to tolerate oral intake.  Reports nausea and vomiting.  Also unable to maintain home medications.  Reports multiple days of these episodes.  States 1 day she became so weak, she was unable to get up.  Patient presented to the emergency department with labs concerning for sodium 134, potassium 5.6, serum bicarb 15, BUN 117, creatinine 2.33 with GFR 24,, CK20 5, white count 24.3, and hemoglobin 10.5.  UA appears cloudy.  CT abdomen pelvis shows moderate abdominal pelvic ascites with mild nodularity belief that the left abdominal wall.  Also noted to have right bladder wall thickening suspicious for primary bladder carcinoma with abdominal pelvis metastasis.  PEG tube in place.  IV PICC line in place.  Creatinine has continued to worsen during this admission, currently 2.61.  Baseline on 02/22/2024 shows creatinine 0.72.   Medications: Outpatient medications: Medications Prior to Admission  Medication Sig Dispense Refill Last Dose/Taking   bisacodyl  (DULCOLAX) 10 MG suppository Place 1 suppository (10 mg total) rectally daily as needed for moderate constipation. 12 suppository 0 Unknown   levothyroxine  (SYNTHROID , LEVOTHROID) 100 MCG/5ML SOLN injection  Inject 10 mLs (200 mcg total) into the vein in the morning.   03/08/2024   ondansetron  (ZOFRAN ) 4 MG/2ML SOLN injection Inject 2 mLs (4 mg total) into the vein every 6 (six) hours as needed for nausea or vomiting.   Unknown   pantoprazole  (PROTONIX ) 40 MG injection Inject 40 mg into the vein daily.   03/08/2024    Current medications: Current Facility-Administered Medications  Medication Dose Route Frequency Provider Last Rate Last Admin   0.9 %  sodium chloride  infusion   Intravenous Continuous Dorinda Drue DASEN, MD 125 mL/hr at 03/13/24 0939 New Bag at 03/13/24 0939   acetaminophen  (TYLENOL ) tablet 650 mg  650 mg Per Tube Q6H PRN Paudel, Keshab, MD       Or   acetaminophen  (TYLENOL ) suppository 650 mg  650 mg Rectal Q6H PRN Paudel, Nena, MD       bisacodyl  (DULCOLAX) suppository 10 mg  10 mg Rectal Daily PRN Paudel, Keshab, MD   10 mg at 03/11/24 1112   Chlorhexidine  Gluconate Cloth 2 % PADS 6 each  6 each Topical Daily Paudel, Keshab, MD   6 each at 03/13/24 0939   enoxaparin  (LOVENOX ) injection 30 mg  30 mg Subcutaneous Q24H Paudel, Nena, MD   30 mg at 03/12/24 2103   insulin  aspart (novoLOG ) injection 0-20 Units  0-20  Units Subcutaneous Q6H Lenon Elsie HERO, [facility]   3 Units at 03/13/24 0100   levothyroxine  (SYNTHROID , LEVOTHROID) injection 200 mcg  200 mcg Intravenous q AM Roann Gouty, MD   200 mcg at 03/13/24 0525   ondansetron  (ZOFRAN ) tablet 4 mg  4 mg Per Tube Q6H PRN Paudel, Keshab, MD       Or   ondansetron  (ZOFRAN ) injection 4 mg  4 mg Intravenous Q6H PRN Paudel, Gouty, MD       pantoprazole  (PROTONIX ) injection 40 mg  40 mg Intravenous Daily Paudel, Keshab, MD   40 mg at 03/13/24 0805   simethicone  (MYLICON) 40 MG/0.6ML suspension 40 mg  40 mg Per Tube BID Djan, Prince T, MD   40 mg at 03/13/24 9060   sodium chloride  flush (NS) 0.9 % injection 10-40 mL  10-40 mL Intracatheter Q12H Paudel, Keshab, MD   10 mL at 03/13/24 9193   sodium chloride  flush (NS) 0.9 % injection 10-40 mL   10-40 mL Intracatheter PRN Roann Gouty, MD       TPN ADULT (ION)   Intravenous Continuous TPN Lenon Elsie HERO, RPH 60 mL/hr at 03/12/24 1802 Infusion Verify at 03/12/24 1802   TPN ADULT (ION)   Intravenous Continuous TPN Lenon Elsie HERO, [facility]          Allergies: No Known Allergies    Past Medical History: Past Medical History:  Diagnosis Date   Cancer (HCC)    Coronary artery disease    GERD (gastroesophageal reflux disease)    Hypertension    Hypothyroidism    Myocardial infarction (HCC)    Thyroid  disease      Past Surgical History: Past Surgical History:  Procedure Laterality Date   CARDIAC CATHETERIZATION     CESAREAN SECTION     CORONARY STENT INTERVENTION N/A 07/24/2019   Procedure: CORONARY STENT INTERVENTION;  Surgeon: Mady Bruckner, MD;  Location: [facility] INVASIVE CV LAB;  Service: Cardiovascular;  Laterality: N/A;  RCA & CFX   ESOPHAGOGASTRODUODENOSCOPY N/A 03/01/2024   Procedure: EGD (ESOPHAGOGASTRODUODENOSCOPY);  Surgeon: Jinny Carmine, MD;  Location: [facility] ENDOSCOPY;  Service: Endoscopy;  Laterality: N/A;   IR GASTROSTOMY TUBE MOD SED  03/05/2024   IR IMAGING GUIDED PORT INSERTION  09/23/2023   IR PARACENTESIS  03/05/2024   LEFT HEART CATH AND CORONARY ANGIOGRAPHY N/A 07/24/2019   Procedure: LEFT HEART CATH AND CORONARY ANGIOGRAPHY;  Surgeon: Hester Wolm PARAS, MD;  Location: [facility] INVASIVE CV LAB;  Service: Cardiovascular;  Laterality: N/A;   TONSILLECTOMY       Family History: Family History  Problem Relation Age of Onset   Atrial fibrillation Mother        Passed January 2024.   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[employer])  Tobacco Use   Smoking status: Never   Smokeless tobacco: Never  Vaping Use   Vaping status: Never Used  Substance and Sexual Activity   Alcohol use: Not Currently    Comment: occ   Drug use: Never   Sexual activity: Not Currently  Other  Topics Concern   Not on file  Social History Narrative   Not on file   Social Drivers of Health   Financial Resource Strain: Low Risk  (07/23/2019)   Overall Financial Resource Strain (CARDIA)    Difficulty of Paying Living Expenses: Not hard at all  Food Insecurity: No Food Insecurity (03/09/2024)   Hunger Vital Sign    Worried About Running Out of Food in the Last Year: Never true    Ran Out of Food in the Last Year: Never true  Transportation Needs: No Transportation Needs (03/09/2024)   PRAPARE - Administrator, Civil Service (Medical): No    Lack of Transportation (Non-Medical): No  Physical Activity: Unknown (07/23/2019)   Exercise Vital Sign    Days of Exercise per Week: 2 days    Minutes of Exercise per Session: Not on file  Stress: No Stress Concern Present (07/23/2019)   [employer] of Occupational Health - Occupational Stress Questionnaire    Feeling of Stress : Not at all  Social Connections: Moderately Integrated (03/09/2024)   Social Connection and Isolation Panel    Frequency of Communication with Friends and Family: More than three times a week    Frequency of Social Gatherings with Friends and Family: Twice a week    Attends Religious Services: 1 to 4 times per year    Active Member of [employer] or Organizations: No    Attends Banker Meetings: Never    Marital Status: Married  Catering manager Violence: Not At Risk (03/09/2024)   Humiliation, Afraid, Rape, and Kick questionnaire    Fear of Current or Ex-Partner: No    Emotionally Abused: No    Physically Abused: No    Sexually Abused: No     Review of Systems: Review of Systems  Constitutional:  Negative for  chills, fever and malaise/fatigue.  HENT:  Negative for congestion, sore throat and tinnitus.   Eyes:  Negative for blurred vision and redness.  Respiratory:  Negative for cough, shortness of breath and wheezing.   Cardiovascular:  Negative for chest pain, palpitations, claudication and leg swelling.  Gastrointestinal:  Positive for nausea and vomiting. Negative for abdominal pain, blood in stool and diarrhea.  Genitourinary:  Negative for flank pain, frequency and hematuria.  Musculoskeletal:  Negative for back pain, falls and myalgias.  Skin:  Negative for rash.  Neurological:  Positive for weakness. Negative for dizziness and headaches.  Endo/Heme/Allergies:  Does not bruise/bleed easily.  Psychiatric/Behavioral:  Negative for depression. The patient is not nervous/anxious and does not have insomnia.     Vital Signs: Blood pressure (!) 88/64, pulse 99, temperature (!) 97.4 F (36.3 C), resp. rate 19, height 5' 4 (1.626 m), weight 92.4 kg, last menstrual period 07/22/2016, SpO2 99%.  Weight trends: Filed Weights   03/11/24 0258 03/12/24 0500 03/13/24 0500  Weight: 89.2 kg 92.2 kg 92.4 kg    Physical Exam: General: NAD, ill-appearing  Head: Normocephalic, atraumatic.  Dry oral mucosal membranes  Eyes: Anicteric  Neck: Supple  Lungs:  Clear to auscultation, normal effort  Heart: Regular rate and rhythm  Abdomen:  Soft, nontender, obese, PEG  Extremities: Trace peripheral edema.  Neurologic: Alert and oriented, conversant  Skin: No lesions        Lab results: Basic Metabolic Panel: Recent Labs  Lab 03/08/24 0520 03/09/24 1119 03/10/24 0512 03/11/24 0938 03/12/24 0500 03/13/24 0441  NA 137   < > 136 134* 138 139  K 4.9   < >  4.8 5.0 3.5 4.1  CL 103   < > 102 103 105 107  CO2 24   < > 20* 18* 15* 16*  GLUCOSE 77   < > 91 123* 148* 114*  BUN 94*   < > 124* 128* 111* 149*  CREATININE 1.65*   < > 2.32* 2.46* 2.49* 2.61*  CALCIUM  9.0   < > 8.4* 8.3* 8.4* 8.5*  MG  1.8  --   --   --  1.9  --   PHOS 3.7  --  5.0*  --  5.8*  --    < > = values in this interval not displayed.    Liver Function Tests: Recent Labs  Lab 03/08/24 0520 03/09/24 1119 03/10/24 0512  AST 43* 43* 50*  ALT 8 8 10   ALKPHOS 78 90 90  BILITOT 0.6 0.5 0.7  PROT 5.7* 6.0* 5.5*  ALBUMIN  2.2* 2.3* 2.0*   No results for input(s): LIPASE, AMYLASE in the last 168 hours. No results for input(s): AMMONIA in the last 168 hours.  CBC: Recent Labs  Lab 03/09/24 0948 03/10/24 0512 03/11/24 0938 03/12/24 0500 03/13/24 0441  WBC 24.3* 21.0* 23.4* 23.8* 24.6*  NEUTROABS  --   --  20.9* 21.6* 22.6*  HGB 10.5* 10.3* 11.0* 10.4* 11.1*  HCT 31.6* 29.6* 32.6* 30.4* 33.8*  MCV 103.3* 96.4 98.8 97.4 99.7  PLT 201 194 184 176 179    Cardiac Enzymes: Recent Labs  Lab 03/09/24 1119  CKTOTAL 25*    BNP: Invalid input(s): POCBNP  CBG: Recent Labs  Lab 03/12/24 1731 03/12/24 1925 03/13/24 0022 03/13/24 0412 03/13/24 0721  GLUCAP 134* 125* 125* 110* 118*    Microbiology: Results for orders placed or performed during the hospital encounter of 11/04/23  Resp panel by RT-PCR (RSV, Flu A&B, Covid) Anterior Nasal Swab     Status: None   Collection Time: 11/04/23  4:17 PM   Specimen: Anterior Nasal Swab  Result Value Ref Range Status   SARS Coronavirus 2 by RT PCR NEGATIVE NEGATIVE Final    Comment: (NOTE) SARS-CoV-2 target nucleic acids are NOT DETECTED.  The SARS-CoV-2 RNA is generally detectable in upper respiratory specimens during the acute phase of infection. The lowest concentration of SARS-CoV-2 viral copies this assay can detect is 138 copies/mL. A negative result does not preclude SARS-Cov-2 infection and should not be used as the sole basis for treatment or other patient management decisions. A negative result may occur with  improper specimen collection/handling, submission of specimen other than nasopharyngeal swab, presence of viral mutation(s)  within the areas targeted by this assay, and inadequate number of viral copies(<138 copies/mL). A negative result must be combined with clinical observations, patient history, and epidemiological information. The expected result is Negative.  Fact Sheet for Patients:  BloggerCourse.com  Fact Sheet for Healthcare Providers:  SeriousBroker.it  This test is no t yet approved or cleared by the [place]  FDA and  has been authorized for detection and/or diagnosis of SARS-CoV-2 by FDA under an Emergency Use Authorization (EUA). This EUA will remain  in effect (meaning this test can be used) for the duration of the COVID-19 declaration under Section 564(b)(1) of the Act, 21 U.S.C.section 360bbb-3(b)(1), unless the authorization is terminated  or revoked sooner.       Influenza A by PCR NEGATIVE NEGATIVE Final   Influenza B by PCR NEGATIVE NEGATIVE Final    Comment: (NOTE) The Xpert Xpress SARS-CoV-2/FLU/RSV plus assay is intended as an aid in  the diagnosis of influenza from Nasopharyngeal swab specimens and should not be used as a sole basis for treatment. Nasal washings and aspirates are unacceptable for Xpert Xpress SARS-CoV-2/FLU/RSV testing.  Fact Sheet for Patients: BloggerCourse.com  Fact Sheet for Healthcare Providers: SeriousBroker.it  This test is not yet approved or cleared by the [place]  FDA and has been authorized for detection and/or diagnosis of SARS-CoV-2 by FDA under an Emergency Use Authorization (EUA). This EUA will remain in effect (meaning this test can be used) for the duration of the COVID-19 declaration under Section 564(b)(1) of the Act, 21 U.S.C. section 360bbb-3(b)(1), unless the authorization is terminated or revoked.     Resp Syncytial Virus by PCR NEGATIVE NEGATIVE Final    Comment: (NOTE) Fact Sheet for  Patients: BloggerCourse.com  Fact Sheet for Healthcare Providers: SeriousBroker.it  This test is not yet approved or cleared by the [place]  FDA and has been authorized for detection and/or diagnosis of SARS-CoV-2 by FDA under an Emergency Use Authorization (EUA). This EUA will remain in effect (meaning this test can be used) for the duration of the COVID-19 declaration under Section 564(b)(1) of the Act, 21 U.S.C. section 360bbb-3(b)(1), unless the authorization is terminated or revoked.  Performed at [facility], [address] [place], [place] [address]   Culture, blood (Routine X 2) w Reflex to ID Panel     Status: None   Collection Time: 11/04/23  4:18 PM   Specimen: BLOOD  Result Value Ref Range Status   Specimen Description BLOOD PORTA CATH  Final   Special Requests   Final    BOTTLES DRAWN AEROBIC AND ANAEROBIC Blood Culture adequate volume   Culture   Final    NO GROWTH 5 DAYS Performed at [facility], [address], [place], [place] [address]    Report Status 11/09/2023 FINAL  Final  Culture, blood (Routine X 2) w Reflex to ID Panel     Status: Abnormal   Collection Time: 11/04/23  4:23 PM   Specimen: BLOOD  Result Value Ref Range Status   Specimen Description   Final    BLOOD BLOOD LEFT ARM Performed at [facility], [address] [place], [place] [address]    Special Requests   Final    BOTTLES DRAWN AEROBIC AND ANAEROBIC Blood Culture results may not be optimal due to an inadequate volume of blood received in culture bottles Performed at [facility], [address] [place], [place] [address]    Culture  Setup Time   Final    GRAM POSITIVE COCCI AEROBIC BOTTLE ONLY CRITICAL RESULT CALLED TO, READ BACK BY AND VERIFIED WITH: MADISON HUNT @ 11/05/23 2058 AB    Culture (A)  Final    STAPHYLOCOCCUS EPIDERMIDIS THE SIGNIFICANCE OF ISOLATING THIS ORGANISM FROM A  SINGLE SET OF BLOOD CULTURES WHEN MULTIPLE SETS ARE DRAWN IS UNCERTAIN. PLEASE NOTIFY THE MICROBIOLOGY DEPARTMENT WITHIN ONE WEEK IF SPECIATION AND SENSITIVITIES ARE REQUIRED. Performed at [facility], [address], [place], [place] [address]    Report Status 11/07/2023 FINAL  Final  Blood Culture ID Panel (Reflexed)     Status: Abnormal   Collection Time: 11/04/23  4:23 PM  Result Value Ref Range Status   Enterococcus faecalis NOT DETECTED NOT DETECTED Final   Enterococcus Faecium NOT DETECTED NOT DETECTED Final   Listeria monocytogenes NOT DETECTED NOT DETECTED Final   Staphylococcus species DETECTED (A) NOT DETECTED Final    Comment: CRITICAL RESULT CALLED  TO, READ BACK BY AND VERIFIED WITH: MADISON HUNT @ 11/05/23 2058 AB    Staphylococcus aureus (BCID) NOT DETECTED NOT DETECTED Final   Staphylococcus epidermidis DETECTED (A) NOT DETECTED Final    Comment: Methicillin (oxacillin) resistant coagulase negative staphylococcus. Possible blood culture contaminant (unless isolated from more than one blood culture draw or clinical case suggests pathogenicity). No antibiotic treatment is indicated for blood  culture contaminants. CRITICAL RESULT CALLED TO, READ BACK BY AND VERIFIED WITH: MADISON HUNT @ 11/05/23 2058 AB    Staphylococcus lugdunensis NOT DETECTED NOT DETECTED Final   Streptococcus species NOT DETECTED NOT DETECTED Final   Streptococcus agalactiae NOT DETECTED NOT DETECTED Final   Streptococcus pneumoniae NOT DETECTED NOT DETECTED Final   Streptococcus pyogenes NOT DETECTED NOT DETECTED Final   A.calcoaceticus-baumannii NOT DETECTED NOT DETECTED Final   Bacteroides fragilis NOT DETECTED NOT DETECTED Final   Enterobacterales NOT DETECTED NOT DETECTED Final   Enterobacter cloacae complex NOT DETECTED NOT DETECTED Final   Escherichia coli NOT DETECTED NOT DETECTED Final   Klebsiella aerogenes NOT DETECTED NOT DETECTED Final   Klebsiella oxytoca NOT DETECTED NOT  DETECTED Final   Klebsiella pneumoniae NOT DETECTED NOT DETECTED Final   Proteus species NOT DETECTED NOT DETECTED Final   Salmonella species NOT DETECTED NOT DETECTED Final   Serratia marcescens NOT DETECTED NOT DETECTED Final   Haemophilus influenzae NOT DETECTED NOT DETECTED Final   Neisseria meningitidis NOT DETECTED NOT DETECTED Final   Pseudomonas aeruginosa NOT DETECTED NOT DETECTED Final   Stenotrophomonas maltophilia NOT DETECTED NOT DETECTED Final   Candida albicans NOT DETECTED NOT DETECTED Final   Candida auris NOT DETECTED NOT DETECTED Final   Candida glabrata NOT DETECTED NOT DETECTED Final   Candida krusei NOT DETECTED NOT DETECTED Final   Candida parapsilosis NOT DETECTED NOT DETECTED Final   Candida tropicalis NOT DETECTED NOT DETECTED Final   Cryptococcus neoformans/gattii NOT DETECTED NOT DETECTED Final   Methicillin resistance mecA/C DETECTED (A) NOT DETECTED Final    Comment: CRITICAL RESULT CALLED TO, READ BACK BY AND VERIFIED WITH: MADISON HUNT @ 11/05/23 2058 AB Performed at [facility], [address] [place], [place] [address]     Coagulation Studies: No results for input(s): LABPROT, INR in the last 72 hours.  Urinalysis: No results for input(s): COLORURINE, LABSPEC, PHURINE, GLUCOSEU, HGBUR, BILIRUBINUR, KETONESUR, PROTEINUR, UROBILINOGEN, NITRITE, LEUKOCYTESUR in the last 72 hours.  Invalid input(s): APPERANCEUR    Imaging: CT ABDOMEN PELVIS WO CONTRAST Result Date: 03/12/2024 EXAM: CT ABDOMEN AND PELVIS WITHOUT CONTRAST 03/12/2024 08:25:13 PM TECHNIQUE: CT of the abdomen and pelvis was performed without the administration of intravenous contrast. Multiplanar reformatted images are provided for review. Automated exposure control, iterative reconstruction, and/or weight based adjustment of the mA/kV was utilized to reduce the radiation dose to as low as reasonably achievable. COMPARISON: 02/29/2024 CLINICAL HISTORY:  Abdominal pain, acute, nonlocalized. FINDINGS: LOWER CHEST: Moderate bilateral pleural effusions, left greater than right, new/increased. LIVER: Cirrhosis. GALLBLADDER AND BILE DUCTS: Vicarious excretion of contrast in the gallbladder. SPLEEN: No acute abnormality. PANCREAS: No acute abnormality. ADRENAL GLANDS: No acute abnormality. KIDNEYS, URETERS AND BLADDER: No stones in the kidneys or ureters. No hydronephrosis. No perinephric or periureteral stranding. Right posterolateral bladder wall thickening, suspicious for primary bladder carcinoma versus serosal metastases. GI AND BOWEL: Suspected normal appendix. Percutaneous gastrostomy and satisfactory position, new. Loculated ascites along the posterior gastric body measuring 5.0 x 8.0 cm, new. Mild free air beneath the anterior abdominal wall, likely post-procedural. PERITONEUM AND  RETROPERITONEUM: Moderate abdominal pelvic ascites, mildly progressive. Mild omental nodularity beneath the left anterior abdominal wall, suggesting peritoneal disease. VASCULATURE: Aorta is normal in caliber. LYMPH NODES: Upper abdominal and retroperitoneal lymphadenopathy, including a dominant 2.2 cm protocaval node. Associated bilateral pelvic lymphadenopathy, including a 2.3 cm short axis left external iliac node. REPRODUCTIVE ORGANS: Status post hysterectomy. BONES AND SOFT TISSUES: Body wall edema. IMPRESSION: 1. Interval percutaneous gastrostomy in satisfactory position. Mild free air, likely postprocedural. 2. Moderate abdominal pelvic ascites, mildly progressive. Associated mild omental nodularity beneath the left anterior abdominal wall, suggesting peritoneal disease. 3. Right posterolateral bladder wall thickening, suspicious for primary bladder carcinoma versus serosal metastases. 4. Stable abdominopelvic nodal metastases. 5. Moderate bilateral pleural effusions, left greater than right, new/increased. 6. Additional ancillary findings as above. Electronically signed by:  Pinkie Pebbles MD 03/12/2024 08:35 PM EDT RP Workstation: [id number]     Assessment & Plan: Ms. Nilani Hugill is a 58 y.o.  female with past medical history of CAD, gastric reflux, hypertension, gastric outlet obstruction status post PEG tube, stage IV metastatic endometrial cancer, who was admitted to [facility] on 03/09/2024 for Dehydration [E86.0]  Acute kidney injury with hyperkalemia appears to be multifactorial.  Admitted with dehydration due to persistent GI losses.  Also experiencing hypotension below baseline.  Baseline creatinine 0.72 on 02/22/2024.  Renal ultrasound completed on 7/17 shows mild left hydronephrosis as seen on previous scan.  CT abdomen pelvis shows questionable bladder cancer with metastasis.  Agree with IV fluids for now.  Potassium 5.4 on admission, has corrected.  Would avoid any further hypotension if possible.  No acute indication for dialysis.  Will consider palliative care consult to discuss goals of care.  Will continue to monitor.  2.  Acute metabolic acidosis likely due to severe dehydration and kidney injury.  Will change IV fluids to sodium bicarbonate  at 100 mL/h.  3.  Hypotension with history of hypertension.  Likely due to volume losses.  May consider midodrine  via PEG tube.   LOS: 3 Debhora Titus 7/22/202511:40 AM

## 2024-03-13 NOTE — Progress Notes (Signed)
 Progress Note   Patient: Tonya [name] FMW:[id number] DOB: 08-Dec-1965 DOA: 03/09/2024     3 DOS: the patient was seen and examined on 03/13/2024     Brief hospital course: From HPI Tonya [name] is a pleasant 58 y.o. female with medical history significant for metastatic stage IV endometrial cancer, CAD, gastric reflux, hypertension, gastric outlet obstruction s/p PEG tube who was recently admitted to [facility] and discharged between 02/29/2024 and 03/08/2024 after being treated for intractable nausea vomiting due to gastric mass s/p PEG tube.  Patient was discharged home on PICC line, TPN and PEG tube. Patient stated since going home she has been feeling very weak, having trouble getting around even with the help of her husband had a fall due to generalized weakness.    ED Course: Upon arrival to the ED, patient is found to have severe deconditioning, hyperkalemia 5.6, severe dehydration/AKI with BUN 117 and creatinine 2.33 and leukocytosis at 24.3.  Hospitalist service was consulted for evaluation for admission for deconditioning, hyperkalemia and dehydration.  Of note she had persistent leukocytosis prior to discharge home and it was thought to be reactive.     Assessment and Plan:   Acute kidney injury secondary to volume depletion Continue IV fluid resuscitation as recommended by nephrology Dehydration improved We will evaluate the need for IV fluid on daily basis given underlying CHF to prevent overload Nephrology consulted for their input given worsening renal function Renal function was normal a month ago however her creatinine went up to 2.3 on presentation Monitor renal function closely  Generalized weakness/deconditioning PT OT on board and recommending SNF   Leukocytosis with no signs of infection Urinalysis normal as well as chest x-ray reviewed that did not show any infiltrate Continue to monitor closely no antibiotics indicated at this time Abdominal CT scan  did not show any acute pathology aside findings of possible urinary bladder metastasis   Hyperkalemia:-Improved - May be related to dehydration, she is also on TPN - There is no EKG changes - Will continue to monitor    Metastatic endometrial cancer with gastric outlet obstruction s/p PEG tube Abdominal ascites 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    Hypothyroidism Continue thyroxine IV   .  HFpEF - Does not appear to be fluid overloaded - Will continue to monitor   GERD - continue Protonix  IV      DVT prophylaxis: Lovenox    Code Status: Full Code   Family Communication: None at bedside   Disposition Plan: Skilled nursing facility   Consults called: None   Subjective:  Patient seen and examined at bedside this morning PEG tube has been adjusted by IR this morning Denying worsening abdominal pain chest pain cough   Physical Exam: \Constitutional: Alert, awake, calm, comfortable HEENT: Neck supple Respiratory: Clear to auscultation B/L, no wheezing, no rales.  Cardiovascular: Regular rate and rhythm, no murmurs / rubs / gallops. No extremity edema. 2+ pedal pulses. No carotid bruits.  Abdomen: Soft, no tenderness, PEG tube in place Musculoskeletal: no clubbing / cyanosis. Good ROM, no contractures. Normal muscle tone.  Skin: no  rashes, lesions, ulcers. Neurologic: CN 2-12 grossly intact. Sensation intact, No focal deficit identified Psychiatric: Alert and oriented x 3. Normal mood.     Data Reviewed:        Latest Ref Rng & Units 03/13/2024    4:41 AM 03/12/2024     5:00 AM 03/11/2024    9:38 AM  CBC  WBC 4.0 - 10.5 K/uL 24.6  23.8  23.4   Hemoglobin 12.0 - 15.0 g/dL 88.8  89.5  88.9   Hematocrit 36.0 - 46.0 % 33.8  30.4  32.6   Platelets 150 - 400 K/uL 179  176  184        Latest Ref Rng & Units 03/13/2024    4:41 AM 03/12/2024    5:00 AM 03/11/2024    9:38 AM  BMP  Glucose 70 - 99 mg/dL 885  851  876   BUN 6 - 20 mg/dL 850  888  871   Creatinine 0.44 - 1.00 mg/dL 7.38  7.50  7.53   Sodium 135 - 145 mmol/L 139  138  134   Potassium 3.5 - 5.1 mmol/L 4.1  3.5  5.0   Chloride 98 - 111 mmol/L 107  105  103   CO2 22 - 32 mmol/L 16  15  18    Calcium  8.9 - 10.3 mg/dL 8.5  8.4  8.3     Vitals:   03/12/24 1923 03/13/24 0454 03/13/24 0500 03/13/24 0843  BP: 98/62 93/64  (!) 88/64  Pulse: (!) 103 (!) 103  99  Resp: 16 16  19   Temp:    (!) 97.4 F (36.3 C)  TempSrc:      SpO2: 99% 98%  99%  Weight:   92.4 kg   Height:         Author: [name], MD 03/13/2024 2:17 PM  For on call review [email or web address].

## 2024-03-13 NOTE — TOC Initial Note (Signed)
 Transition of Care [facility]) - Initial/Assessment Note    Patient Details  Name: Tonya [name] MRN: [id number] Date of Birth: 1966/04/25  [facility]) CM/SW Contact:    Dalia GORMAN Fuse, RN Phone Number: 03/13/2024, 10:52 AM  Clinical Narrative:                 [facility] visited patient in the room. Patient had peg placed, but had a leak over night. The patient is waiting to be cleaned up and requested that [facility] visit tomorrow. The patient did state that she is open to SNF for STR. She had questions about FMLA and short-term disability through her employer, TOC instructed the patient to reach out to her employer's HR department. The patient also wanted to know if she can bring her laptop to rehab. TOC explained that it should not be a problem to bring a device for recreational use. FL2 completed and sent out in [place]        Patient Goals and CMS Choice            Expected Discharge Plan and Services                                              Prior Living Arrangements/Services                       Activities of Daily Living   ADL Screening (condition at time of admission) Independently performs ADLs?: No Does the patient have a NEW difficulty with bathing/dressing/toileting/self-feeding that is expected to last >3 days?: Yes (Initiates electronic notice to provider for possible OT consult) Does the patient have a NEW difficulty with getting in/out of bed, walking, or climbing stairs that is expected to last >3 days?: Yes (Initiates electronic notice to provider for possible PT consult) Does the patient have a NEW difficulty with communication that is expected to last >3 days?: No Is the patient deaf or have difficulty hearing?: No Does the patient have difficulty seeing, even when wearing glasses/contacts?: No Does the patient have difficulty concentrating, remembering, or making decisions?: No  Permission Sought/Granted                   Emotional Assessment              Admission diagnosis:  Dehydration [E86.0] Patient Active Problem List   Diagnosis Date Noted   Dehydration 03/09/2024   Muscular deconditioning 03/09/2024   Acute hyperkalemia 03/09/2024   Protein-calorie malnutrition, severe 03/03/2024   Leukocytosis 03/02/2024   Hypokalemia 03/02/2024   Chronic HFrEF (heart failure with reduced ejection fraction) (HCC) 03/02/2024   Normocytic anemia 03/02/2024   Intractable nausea and vomiting 03/01/2024   Intractable nausea 03/01/2024   Gastric mass 03/01/2024   Goals of care, counseling/discussion 02/28/2024   Neutropenia, febrile (HCC) 11/04/2023   Actinomyces infection 10/12/2023   Closed compression fracture of body of L1 vertebra (HCC) 10/11/2023   Thrombocytopenia (HCC) 10/10/2023   Bacteremia due to Streptococcus 10/05/2023   Hypophosphatemia 10/04/2023   AKI (acute kidney injury) (HCC) 10/03/2023   Chronic hyponatremia 10/03/2023   Metabolic acidosis 10/03/2023   Neutropenia (HCC) 10/03/2023   C. 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) 09/21/2023  Endometrial carcinoma (HCC) 09/21/2023   Malignant neoplasm of uterus (HCC) 09/01/2023   B12 deficiency 05/25/2023   Vitamin D deficiency 05/25/2023   Coronary artery disease involving native coronary artery of native heart with angina pectoris (HCC) 07/24/2022   Type 2 diabetes mellitus with peripheral angiopathy (HCC) 05/18/2022   Ischemic cardiomyopathy 08/09/2019   Essential hypertension 08/09/2019   Hyperlipidemia associated with type 2 diabetes mellitus (HCC) 08/09/2019   Morbid obesity (HCC) 08/09/2019   Diabetes mellitus (HCC) 08/09/2019   NSTEMI (non-ST elevated myocardial infarction) (HCC) 07/23/2019   Hypothyroidism 07/23/2019   PCP:  Cleotilde Oneil FALCON, MD Pharmacy:   [facility]  [id number] [place], [place] - [address] AT [facility] [address] [address] [facility] [address] [place] [address] Phone: [phone] Fax: [phone]     Social Drivers of Health (SDOH) Social History: SDOH Screenings   Food Insecurity: No Food Insecurity (03/09/2024)  Housing: Low Risk  (03/09/2024)  Transportation Needs: No Transportation Needs (03/09/2024)  Utilities: Not At Risk (03/09/2024)  Depression (PHQ2-9): Low Risk  (02/22/2024)  Financial Resource Strain: Low Risk  (07/23/2019)  Physical Activity: Unknown (07/23/2019)  Social Connections: Moderately Integrated (03/09/2024)  Stress: No Stress Concern Present (07/23/2019)  Tobacco Use: Low Risk  (03/09/2024)  Health Literacy: Adequate Health Literacy (09/29/2023)   SDOH Interventions:     Readmission Risk Interventions    03/03/2024    2:15 PM 10/05/2023    2:26 PM  Readmission Risk Prevention Plan  Transportation Screening Complete Complete  HRI or Home Care Consult  Complete  Palliative Care Screening  Complete  Medication Review (RN Care Manager) Complete Complete  PCP or Specialist appointment within 3-5 days of discharge Complete   HRI or Home Care Consult Complete   Palliative Care Screening Not Applicable   Skilled Nursing Facility Not Applicable

## 2024-03-13 NOTE — Plan of Care (Signed)
  Problem: Education: ?Goal: Knowledge of General Education information will improve ?Description: Including pain rating scale, medication(s)/side effects and non-pharmacologic comfort measures ?Outcome: Progressing ?  ?Problem: Clinical Measurements: ?Goal: Ability to maintain clinical measurements within normal limits will improve ?Outcome: Progressing ?  ?Problem: Elimination: ?Goal: Will not experience complications related to bowel motility ?Outcome: Progressing ?  ?Problem: Safety: ?Goal: Ability to remain free from injury will improve ?Outcome: Progressing ?  ?

## 2024-03-14 ENCOUNTER — Inpatient Hospital Stay

## 2024-03-14 DIAGNOSIS — C541 Malignant neoplasm of endometrium: Secondary | ICD-10-CM | POA: Diagnosis not present

## 2024-03-14 DIAGNOSIS — E875 Hyperkalemia: Secondary | ICD-10-CM

## 2024-03-14 DIAGNOSIS — E860 Dehydration: Secondary | ICD-10-CM | POA: Diagnosis not present

## 2024-03-14 DIAGNOSIS — K3189 Other diseases of stomach and duodenum: Secondary | ICD-10-CM | POA: Diagnosis not present

## 2024-03-14 LAB — BASIC METABOLIC PANEL WITH GFR
Anion gap: 17 — ABNORMAL HIGH (ref 5–15)
BUN: 150 mg/dL — ABNORMAL HIGH (ref 6–20)
CO2: 13 mmol/L — ABNORMAL LOW (ref 22–32)
Calcium: 8.3 mg/dL — ABNORMAL LOW (ref 8.9–10.3)
Chloride: 105 mmol/L (ref 98–111)
Creatinine, Ser: 2.49 mg/dL — ABNORMAL HIGH (ref 0.44–1.00)
GFR, Estimated: 22 mL/min — ABNORMAL LOW (ref >60)
Glucose, Bld: 155 mg/dL — ABNORMAL HIGH (ref 70–99)
Potassium: 2.8 mmol/L — ABNORMAL LOW (ref 3.5–5.1)
Sodium: 135 mmol/L (ref 135–145)

## 2024-03-14 LAB — CBC WITH DIFFERENTIAL/PLATELET
Abs Immature Granulocytes: 0.36 K/uL — ABNORMAL HIGH (ref 0.00–0.07)
Basophils Absolute: 0 K/uL (ref 0.0–0.1)
Basophils Relative: 0 %
Eosinophils Absolute: 0 K/uL (ref 0.0–0.5)
Eosinophils Relative: 0 %
HCT: 30.8 % — ABNORMAL LOW (ref 36.0–46.0)
Hemoglobin: 10.4 g/dL — ABNORMAL LOW (ref 12.0–15.0)
Immature Granulocytes: 1 %
Lymphocytes Relative: 2 %
Lymphs Abs: 0.5 K/uL — ABNORMAL LOW (ref 0.7–4.0)
MCH: 32.9 pg (ref 26.0–34.0)
MCHC: 33.8 g/dL (ref 30.0–36.0)
MCV: 97.5 fL (ref 80.0–100.0)
Monocytes Absolute: 0.9 K/uL (ref 0.1–1.0)
Monocytes Relative: 4 %
Neutro Abs: 23.7 K/uL — ABNORMAL HIGH (ref 1.7–7.7)
Neutrophils Relative %: 93 %
Platelets: 152 K/uL (ref 150–400)
RBC: 3.16 MIL/uL — ABNORMAL LOW (ref 3.87–5.11)
RDW: 17.5 % — ABNORMAL HIGH (ref 11.5–15.5)
Smear Review: NORMAL
WBC: 25.5 K/uL — ABNORMAL HIGH (ref 4.0–10.5)
nRBC: 0 % (ref 0.0–0.2)

## 2024-03-14 LAB — GLUCOSE, CAPILLARY
Glucose-Capillary: 108 mg/dL — ABNORMAL HIGH (ref 70–99)
Glucose-Capillary: 137 mg/dL — ABNORMAL HIGH (ref 70–99)
Glucose-Capillary: 143 mg/dL — ABNORMAL HIGH (ref 70–99)
Glucose-Capillary: 144 mg/dL — ABNORMAL HIGH (ref 70–99)
Glucose-Capillary: 152 mg/dL — ABNORMAL HIGH (ref 70–99)
Glucose-Capillary: 176 mg/dL — ABNORMAL HIGH (ref 70–99)

## 2024-03-14 LAB — PHOSPHORUS: Phosphorus: 5.3 mg/dL — ABNORMAL HIGH (ref 2.5–4.6)

## 2024-03-14 LAB — POTASSIUM: Potassium: 4.4 mmol/L (ref 3.5–5.1)

## 2024-03-14 MED ORDER — MIDODRINE HCL 5 MG PO TABS
10.0000 mg | ORAL_TABLET | Freq: Once | ORAL | Status: AC
  Administered 2024-03-14: 10 mg via ORAL
  Filled 2024-03-14: qty 2

## 2024-03-14 MED ORDER — METHOCARBAMOL 1000 MG/10ML IJ SOLN
500.0000 mg | Freq: Four times a day (QID) | INTRAMUSCULAR | Status: DC | PRN
  Administered 2024-03-14 – 2024-03-17 (×4): 500 mg via INTRAVENOUS
  Filled 2024-03-14 (×5): qty 5

## 2024-03-14 MED ORDER — POTASSIUM CHLORIDE 20 MEQ PO PACK: 20.0000 meq | PACK | ORAL | Status: DC

## 2024-03-14 MED ORDER — POTASSIUM CHLORIDE 20 MEQ PO PACK: 40.0000 meq | PACK | Freq: Once | ORAL | Status: DC

## 2024-03-14 MED ORDER — POTASSIUM CHLORIDE 10 MEQ/50ML IV SOLN
10.0000 meq | INTRAVENOUS | Status: AC
  Administered 2024-03-14 (×6): 10 meq via INTRAVENOUS
  Filled 2024-03-14 (×4): qty 50
  Filled 2024-03-14: qty 250
  Filled 2024-03-14 (×3): qty 50

## 2024-03-14 MED ORDER — TRACE MINERALS CU-MN-SE-ZN 300-55-60-3000 MCG/ML IV SOLN
INTRAVENOUS | Status: AC
  Filled 2024-03-14: qty 681.6

## 2024-03-14 MED ORDER — SODIUM CHLORIDE 0.9 % IV BOLUS
250.0000 mL | Freq: Once | INTRAVENOUS | Status: AC
  Administered 2024-03-14: 250 mL via INTRAVENOUS

## 2024-03-14 NOTE — Progress Notes (Signed)
 [facility]  ROUNDING NOTE   Subjective:   Patient seen laying in bed Alert States she feels weak today  TPN at 64ml/hr Sodium Bicarb infusion @100ml .hr  Creatinine 2.49 Potassium 2.8 S bicarb 13  Objective:  Vital signs in last 24 hours:  Temp:  [97.4 F (36.3 C)-98.6 F (37 C)] 97.6 F (36.4 C) (07/23 0750) Pulse Rate:  [96-104] 104 (07/23 0750) Resp:  [18-20] 20 (07/23 0750) BP: (92-119)/(58-70) 119/58 (07/23 0750) SpO2:  [97 %-100 %] 99 % (07/23 0750) Weight:  [97.4 kg] 97.4 kg (07/23 0500)  Weight change: 5 kg Filed Weights   03/12/24 0500 03/13/24 0500 03/14/24 0500  Weight: 92.2 kg 92.4 kg 97.4 kg    Intake/Output: I/O last 3 completed shifts: In: 1668.7 [I.V.:1668.7] Out: 3325 [Urine:600; Drains:2725]   Intake/Output this shift:  No intake/output data recorded.  Physical Exam: General: NAD, ill appearing  Head: Normocephalic, atraumatic. Moist oral mucosal membranes  Eyes: Anicteric  Neck: Supple  Lungs:  Clear to auscultation, room air  Heart: Regular rate and rhythm  Abdomen:  Soft, nontender  Extremities:  Trace peripheral edema.  Neurologic: Awake, alert, conversant  Skin: Warm,dry, no rash       Basic Metabolic Panel: Recent Labs  Lab 03/08/24 0520 03/09/24 1119 03/10/24 0512 03/11/24 0938 03/12/24 0500 03/13/24 0441 03/14/24 0453  NA 137   < > 136 134* 138 139 135  K 4.9   < > 4.8 5.0 3.5 4.1 2.8*  CL 103   < > 102 103 105 107 105  CO2 24   < > 20* 18* 15* 16* 13*  GLUCOSE 77   < > 91 123* 148* 114* 155*  BUN 94*   < > 124* 128* 111* 149* 150*  CREATININE 1.65*   < > 2.32* 2.46* 2.49* 2.61* 2.49*  CALCIUM  9.0   < > 8.4* 8.3* 8.4* 8.5* 8.3*  MG 1.8  --   --   --  1.9  --   --   PHOS 3.7  --  5.0*  --  5.8*  --  5.3*   < > = values in this interval not displayed.    Liver Function Tests: Recent Labs  Lab 03/08/24 0520 03/09/24 1119 03/10/24 0512  AST 43* 43* 50*  ALT 8 8 10   ALKPHOS 78 90 90  BILITOT 0.6  0.5 0.7  PROT 5.7* 6.0* 5.5*  ALBUMIN  2.2* 2.3* 2.0*   No results for input(s): LIPASE, AMYLASE in the last 168 hours. No results for input(s): AMMONIA in the last 168 hours.  CBC: Recent Labs  Lab 03/10/24 0512 03/11/24 0938 03/12/24 0500 03/13/24 0441 03/14/24 0453  WBC 21.0* 23.4* 23.8* 24.6* 25.5*  NEUTROABS  --  20.9* 21.6* 22.6* 23.7*  HGB 10.3* 11.0* 10.4* 11.1* 10.4*  HCT 29.6* 32.6* 30.4* 33.8* 30.8*  MCV 96.4 98.8 97.4 99.7 97.5  PLT 194 184 176 179 152    Cardiac Enzymes: Recent Labs  Lab 03/09/24 1119  CKTOTAL 25*    BNP: Invalid input(s): POCBNP  CBG: Recent Labs  Lab 03/13/24 1733 03/13/24 2000 03/14/24 0014 03/14/24 0443 03/14/24 0846  GLUCAP 159* 159* 176* 144* 152*    Microbiology: Results for orders placed or performed during the hospital encounter of 11/04/23  Resp panel by RT-PCR (RSV, Flu A&B, Covid) Anterior Nasal Swab     Status: None   Collection Time: 11/04/23  4:17 PM   Specimen: Anterior Nasal Swab  Result Value Ref Range Status  SARS Coronavirus 2 by RT PCR NEGATIVE NEGATIVE Final    Comment: (NOTE) SARS-CoV-2 target nucleic acids are NOT DETECTED.  The SARS-CoV-2 RNA is generally detectable in upper respiratory specimens during the acute phase of infection. The lowest concentration of SARS-CoV-2 viral copies this assay can detect is 138 copies/mL. A negative result does not preclude SARS-Cov-2 infection and should not be used as the sole basis for treatment or other patient management decisions. A negative result may occur with  improper specimen collection/handling, submission of specimen other than nasopharyngeal swab, presence of viral mutation(s) within the areas targeted by this assay, and inadequate number of viral copies(<138 copies/mL). A negative result must be combined with clinical observations, patient history, and epidemiological information. The expected result is Negative.  Fact Sheet for Patients:   BloggerCourse.com  Fact Sheet for Healthcare Providers:  SeriousBroker.it  This test is no t yet approved or cleared by the [place]  FDA and  has been authorized for detection and/or diagnosis of SARS-CoV-2 by FDA under an Emergency Use Authorization (EUA). This EUA will remain  in effect (meaning this test can be used) for the duration of the COVID-19 declaration under Section 564(b)(1) of the Act, 21 U.S.C.section 360bbb-3(b)(1), unless the authorization is terminated  or revoked sooner.       Influenza A by PCR NEGATIVE NEGATIVE Final   Influenza B by PCR NEGATIVE NEGATIVE Final    Comment: (NOTE) The Xpert Xpress SARS-CoV-2/FLU/RSV plus assay is intended as an aid in the diagnosis of influenza from Nasopharyngeal swab specimens and should not be used as a sole basis for treatment. Nasal washings and aspirates are unacceptable for Xpert Xpress SARS-CoV-2/FLU/RSV testing.  Fact Sheet for Patients: BloggerCourse.com  Fact Sheet for Healthcare Providers: SeriousBroker.it  This test is not yet approved or cleared by the [place]  FDA and has been authorized for detection and/or diagnosis of SARS-CoV-2 by FDA under an Emergency Use Authorization (EUA). This EUA will remain in effect (meaning this test can be used) for the duration of the COVID-19 declaration under Section 564(b)(1) of the Act, 21 U.S.C. section 360bbb-3(b)(1), unless the authorization is terminated or revoked.     Resp Syncytial Virus by PCR NEGATIVE NEGATIVE Final    Comment: (NOTE) Fact Sheet for Patients: BloggerCourse.com  Fact Sheet for Healthcare Providers: SeriousBroker.it  This test is not yet approved or cleared by the [place]  FDA and has been authorized for detection and/or diagnosis of SARS-CoV-2 by FDA under an Emergency Use  Authorization (EUA). This EUA will remain in effect (meaning this test can be used) for the duration of the COVID-19 declaration under Section 564(b)(1) of the Act, 21 U.S.C. section 360bbb-3(b)(1), unless the authorization is terminated or revoked.  Performed at [facility], [address] [place], [place] [address]   Culture, blood (Routine X 2) w Reflex to ID Panel     Status: None   Collection Time: 11/04/23  4:18 PM   Specimen: BLOOD  Result Value Ref Range Status   Specimen Description BLOOD PORTA CATH  Final   Special Requests   Final    BOTTLES DRAWN AEROBIC AND ANAEROBIC Blood Culture adequate volume   Culture   Final    NO GROWTH 5 DAYS Performed at [facility], [address] [place], [place] [address]    Report Status 11/09/2023 FINAL  Final  Culture, blood (Routine X 2) w Reflex to ID Panel     Status: Abnormal   Collection Time: 11/04/23  4:23 PM   Specimen: BLOOD  Result Value Ref Range Status   Specimen Description   Final    BLOOD BLOOD LEFT ARM Performed at [facility], [address] [place], [place] [address]    Special Requests   Final    BOTTLES DRAWN AEROBIC AND ANAEROBIC Blood Culture results may not be optimal due to an inadequate volume of blood received in culture bottles Performed at [facility], [address] [place], [place] [address]    Culture  Setup Time   Final    GRAM POSITIVE COCCI AEROBIC BOTTLE ONLY CRITICAL RESULT CALLED TO, READ BACK BY AND VERIFIED WITH: MADISON HUNT @ 11/05/23 2058 AB    Culture (A)  Final    STAPHYLOCOCCUS EPIDERMIDIS THE SIGNIFICANCE OF ISOLATING THIS ORGANISM FROM A SINGLE SET OF BLOOD CULTURES WHEN MULTIPLE SETS ARE DRAWN IS UNCERTAIN. PLEASE NOTIFY THE MICROBIOLOGY DEPARTMENT WITHIN ONE WEEK IF SPECIATION AND SENSITIVITIES ARE REQUIRED. Performed at [facility], [address], [place], [place] [address]    Report Status 11/07/2023 FINAL  Final  Blood  Culture ID Panel (Reflexed)     Status: Abnormal   Collection Time: 11/04/23  4:23 PM  Result Value Ref Range Status   Enterococcus faecalis NOT DETECTED NOT DETECTED Final   Enterococcus Faecium NOT DETECTED NOT DETECTED Final   Listeria monocytogenes NOT DETECTED NOT DETECTED Final   Staphylococcus species DETECTED (A) NOT DETECTED Final    Comment: CRITICAL RESULT CALLED TO, READ BACK BY AND VERIFIED WITH: MADISON HUNT @ 11/05/23 2058 AB    Staphylococcus aureus (BCID) NOT DETECTED NOT DETECTED Final   Staphylococcus epidermidis DETECTED (A) NOT DETECTED Final    Comment: Methicillin (oxacillin) resistant coagulase negative staphylococcus. Possible blood culture contaminant (unless isolated from more than one blood culture draw or clinical case suggests pathogenicity). No antibiotic treatment is indicated for blood  culture contaminants. CRITICAL RESULT CALLED TO, READ BACK BY AND VERIFIED WITH: MADISON HUNT @ 11/05/23 2058 AB    Staphylococcus lugdunensis NOT DETECTED NOT DETECTED Final   Streptococcus species NOT DETECTED NOT DETECTED Final   Streptococcus agalactiae NOT DETECTED NOT DETECTED Final   Streptococcus pneumoniae NOT DETECTED NOT DETECTED Final   Streptococcus pyogenes NOT DETECTED NOT DETECTED Final   A.calcoaceticus-baumannii NOT DETECTED NOT DETECTED Final   Bacteroides fragilis NOT DETECTED NOT DETECTED Final   Enterobacterales NOT DETECTED NOT DETECTED Final   Enterobacter cloacae complex NOT DETECTED NOT DETECTED Final   Escherichia coli NOT DETECTED NOT DETECTED Final   Klebsiella aerogenes NOT DETECTED NOT DETECTED Final   Klebsiella oxytoca NOT DETECTED NOT DETECTED Final   Klebsiella pneumoniae NOT DETECTED NOT DETECTED Final   Proteus species NOT DETECTED NOT DETECTED Final   Salmonella species NOT DETECTED NOT DETECTED Final   Serratia marcescens NOT DETECTED NOT DETECTED Final   Haemophilus influenzae NOT DETECTED NOT DETECTED Final   Neisseria  meningitidis NOT DETECTED NOT DETECTED Final   Pseudomonas aeruginosa NOT DETECTED NOT DETECTED Final   Stenotrophomonas maltophilia NOT DETECTED NOT DETECTED Final   Candida albicans NOT DETECTED NOT DETECTED Final   Candida auris NOT DETECTED NOT DETECTED Final   Candida glabrata NOT DETECTED NOT DETECTED Final   Candida krusei NOT DETECTED NOT DETECTED Final   Candida parapsilosis NOT DETECTED NOT DETECTED Final   Candida tropicalis NOT DETECTED NOT DETECTED Final   Cryptococcus neoformans/gattii NOT DETECTED NOT DETECTED Final   Methicillin resistance mecA/C DETECTED (A) NOT DETECTED Final  Comment: CRITICAL RESULT CALLED TO, READ BACK BY AND VERIFIED WITH: MADISON HUNT @ 11/05/23 2058 AB Performed at [facility], [address] [place], [place] [address]     Coagulation Studies: No results for input(s): LABPROT, INR in the last 72 hours.  Urinalysis: No results for input(s): COLORURINE, LABSPEC, PHURINE, GLUCOSEU, HGBUR, BILIRUBINUR, KETONESUR, PROTEINUR, UROBILINOGEN, NITRITE, LEUKOCYTESUR in the last 72 hours.  Invalid input(s): APPERANCEUR    Imaging: CT ABDOMEN PELVIS WO CONTRAST Result Date: 03/12/2024 EXAM: CT ABDOMEN AND PELVIS WITHOUT CONTRAST 03/12/2024 08:25:13 PM TECHNIQUE: CT of the abdomen and pelvis was performed without the administration of intravenous contrast. Multiplanar reformatted images are provided for review. Automated exposure control, iterative reconstruction, and/or weight based adjustment of the mA/kV was utilized to reduce the radiation dose to as low as reasonably achievable. COMPARISON: 02/29/2024 CLINICAL HISTORY: Abdominal pain, acute, nonlocalized. FINDINGS: LOWER CHEST: Moderate bilateral pleural effusions, left greater than right, new/increased. LIVER: Cirrhosis. GALLBLADDER AND BILE DUCTS: Vicarious excretion of contrast in the gallbladder. SPLEEN: No acute abnormality. PANCREAS: No acute abnormality.  ADRENAL GLANDS: No acute abnormality. KIDNEYS, URETERS AND BLADDER: No stones in the kidneys or ureters. No hydronephrosis. No perinephric or periureteral stranding. Right posterolateral bladder wall thickening, suspicious for primary bladder carcinoma versus serosal metastases. GI AND BOWEL: Suspected normal appendix. Percutaneous gastrostomy and satisfactory position, new. Loculated ascites along the posterior gastric body measuring 5.0 x 8.0 cm, new. Mild free air beneath the anterior abdominal wall, likely post-procedural. PERITONEUM AND RETROPERITONEUM: Moderate abdominal pelvic ascites, mildly progressive. Mild omental nodularity beneath the left anterior abdominal wall, suggesting peritoneal disease. VASCULATURE: Aorta is normal in caliber. LYMPH NODES: Upper abdominal and retroperitoneal lymphadenopathy, including a dominant 2.2 cm protocaval node. Associated bilateral pelvic lymphadenopathy, including a 2.3 cm short axis left external iliac node. REPRODUCTIVE ORGANS: Status post hysterectomy. BONES AND SOFT TISSUES: Body wall edema. IMPRESSION: 1. Interval percutaneous gastrostomy in satisfactory position. Mild free air, likely postprocedural. 2. Moderate abdominal pelvic ascites, mildly progressive. Associated mild omental nodularity beneath the left anterior abdominal wall, suggesting peritoneal disease. 3. Right posterolateral bladder wall thickening, suspicious for primary bladder carcinoma versus serosal metastases. 4. Stable abdominopelvic nodal metastases. 5. Moderate bilateral pleural effusions, left greater than right, new/increased. 6. Additional ancillary findings as above. Electronically signed by: Pinkie Pebbles MD 03/12/2024 08:35 PM EDT RP Workstation: [id number]     Medications:    potassium chloride  10 mEq (03/14/24 1108)   sodium bicarbonate  150 mEq in dextrose  5 % 1,150 mL infusion Stopped (03/14/24 0939)   TPN ADULT (ION) 60 mL/hr at 03/13/24 1826   TPN ADULT (ION)       Chlorhexidine  Gluconate Cloth  6 each Topical Daily   enoxaparin  (LOVENOX ) injection  30 mg Subcutaneous Q24H   insulin  aspart  0-20 Units Subcutaneous Q6H   levothyroxine   200 mcg Intravenous q AM   pantoprazole   40 mg Intravenous Daily   simethicone   40 mg Per Tube BID   sodium chloride  flush  10-40 mL Intracatheter Q12H   acetaminophen  **OR** acetaminophen , bisacodyl , methocarbamol  (ROBAXIN ) injection, ondansetron  **OR** ondansetron  (ZOFRAN ) IV, sodium chloride  flush  Assessment/ Plan:  Tonya [name] is a 58 y.o.  female with past medical history of CAD, gastric reflux, hypertension, gastric outlet obstruction status post PEG tube, stage IV metastatic endometrial cancer, who was admitted to [facility] on 03/09/2024 for Dehydration [E86.0]   Acute kidney injury with hyperkalemia appears to be multifactorial. Admitted with dehydration due to persistent GI losses. Also experiencing hypotension below  baseline. Baseline creatinine 0.72 on 02/22/2024. Renal ultrasound completed on 7/17 shows mild left hydronephrosis as seen on previous scan. CT abdomen pelvis shows questionable bladder cancer with metastasis. Agree with IV fluids for now. Potassium 5.4 on admission, now hypokalemic 2.8.   Creatinine shows some improvement today, 2.49. Continue IVF as ordered. Blood pressure has shown improvement also. IV potassium supplementation has been ordered. No acute indication for dialysis. Will continue to monitor.   Lab Results  Component Value Date   CREATININE 2.49 (H) 03/14/2024   CREATININE 2.61 (H) 03/13/2024   CREATININE 2.49 (H) 03/12/2024    Intake/Output Summary (Last 24 hours) at 03/14/2024 1114 Last data filed at 03/14/2024 0500 Gross per 24 hour  Intake 1658.73 ml  Output 1950 ml  Net -291.27 ml   2.  Acute metabolic acidosis likely due to severe dehydration and kidney injury.  Continue sodium bicarbonate  at 100 mL/h.   3.  Hypotension with history of hypertension.  Likely due to volume  losses.  Blood pressure today, 119/58.      LOS: 4 Tonya [name] 7/23/202511:14 AM

## 2024-03-14 NOTE — Plan of Care (Signed)
  Problem: Education: Goal: Knowledge of General Education information will improve Description: Including pain rating scale, medication(s)/side effects and non-pharmacologic comfort measures Outcome: Progressing   Problem: Health Behavior/Discharge Planning: Goal: Ability to manage health-related needs will improve Outcome: Progressing   Problem: Clinical Measurements: Goal: Ability to maintain clinical measurements within normal limits will improve Outcome: Progressing Goal: Will remain free from infection Outcome: Progressing Goal: Diagnostic test results will improve Outcome: Progressing Goal: Respiratory complications will improve Outcome: Progressing Goal: Cardiovascular complication will be avoided Outcome: Progressing   Problem: Coping: Goal: Level of anxiety will decrease Outcome: Progressing   Problem: Pain Managment: Goal: General experience of comfort will improve and/or be controlled Outcome: Progressing   Problem: Safety: Goal: Ability to remain free from injury will improve Outcome: Progressing   Problem: Skin Integrity: Goal: Risk for impaired skin integrity will decrease Outcome: Progressing

## 2024-03-14 NOTE — Progress Notes (Signed)
 PROGRESS NOTE    Tonya [name]   FMW:[id number] DOB: November 01, 1965  DOA: 03/09/2024 Date of Service: 03/14/24 which is hospital day 4  PCP: Cleotilde Oneil FALCON, MD    Hospital course / significant events:   HPI: Tonya [name] is a pleasant 58 y.o. female with medical history significant for metastatic stage IV endometrial cancer, CAD, gastric reflux, hypertension, gastric outlet obstruction s/p PEG tube who was recently admitted to [facility] and discharged between 02/29/2024 and 03/08/2024 after being treated for intractable nausea vomiting due to gastric mass s/p PEG tube.  Patient was discharged home on PICC line, TPN and PEG tube. Weakness since that discharge and presented again to ED.   07/18: to ED. Dx hyperkalemia 5.6, severe dehydration/AKI with BUN 117 and creatinine 2.33 and leukocytosis at 24.3 though leukocytosis appeared chronic/stable previous admission  07/19 - 07/21: fluctuating lytes, continue TPN 07/22: leak at PEG tube site today, IR following. Renal fxn worse Cr 2.6.  07/23: Cr to 2.5, low K, high Phos, stable WBC. Recs for STR/SNF. Placement will be difficult w/ TPN, TOC following      Consultants:  Nephrology  Palliative Care   Procedures/Surgeries: none      ASSESSMENT & PLAN:   Acute kidney injury secondary to volume depletion Renal function was normal a month ago however her creatinine went up to 2.3 on presentation Continue IV fluid resuscitation as recommended by nephrology We will evaluate the need for IV fluid on daily basis given underlying CHF to prevent overload Monitor BMP  Generalized weakness/deconditioning PT OT on board and recommending SNF   Leukocytosis with no signs of infection Urinalysis normal as well as chest x-ray reviewed that did not show any infiltrate, Abdominal CT scan did not show any acute pathology aside findings of possible urinary bladder metastasis Monitor CBC no antibiotics indicated at this  time  Hyperkalemia - resolved May be related to dehydration, she is also on TPN Montior BMP  Hypokalemia Replace as needed Monitor BMP   Metastatic endometrial cancer with gastric outlet obstruction s/p G tube for venting Abdominal ascites 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  Underweight - under 18  overweight - 25 to 29 obese - 30 or more Class 1 obesity: BMI of 30.0 to 34 Class 2 obesity: BMI of 35.0 to 39 Class 3 obesity: BMI of 40.0 to 49 Super Morbid Obesity: BMI 50-59 Super-super Morbid Obesity: BMI 60+ Healthy nutrition and physical activity advised as adjunct to other disease management and risk reduction treatments    DVT prophylaxis: lovenox  IV fluids: bicarb continuous IV fluids  Nutrition: TPN Central lines / other devices: PICC, Gtube, chemo port   Code Status: FULL CODE ACP documentation reviewed: has GOC form on file in [facility] but no formal advanced directive / MOST   TOC needs: SNF rehab placement Medical barriers to dispo: electrolyte fluctuations, renal function, TPN adjusting. Expected  medical readiness for discharge several more days .              Subjective / Brief ROS:  Patient reports tired, feels hot but the fan helps Denies CP/SOB. States she occasionally feels like she is breathing harder but denies feeling short-winded  Pain  controlled.  Denies new weakness.  Reports no concerns w/ urination/defecation.   Family Communication: none at bedside     Objective Findings:  Vitals:   03/14/24 0014 03/14/24 0500 03/14/24 0636 03/14/24 0750  BP: 100/61  98/70 (!) 119/58  Pulse:   97 (!) 104  Resp: 18  20 20   Temp: 98.6 F (37 C)  97.9 F (36.6 C) 97.6 F (36.4 C)  TempSrc:   Oral Oral  SpO2: 99%  97% 99%  Weight:  97.4 kg    Height:        Intake/Output Summary (Last 24 hours) at 03/14/2024 1428 Last data filed at 03/14/2024 0500 Gross per 24 hour  Intake 1658.73 ml  Output 1950 ml  Net -291.27 ml   Filed Weights   03/12/24 0500 03/13/24 0500 03/14/24 0500  Weight: 92.2 kg 92.4 kg 97.4 kg    Examination:  Physical Exam Constitutional:      General: She is not in acute distress.    Appearance: She is ill-appearing.  Cardiovascular:     Rate and Rhythm: Normal rate and regular rhythm.     Heart sounds: Normal heart sounds.  Pulmonary:     Effort: No respiratory distress.     Breath sounds: No wheezing or rhonchi.     Comments: Increased WOB, she states this comes and goes  Abdominal:     General: There is distension.     Palpations: Abdomen is soft.     Tenderness: There is no abdominal tenderness. There is no guarding.  Skin:    General: Skin is dry.     Coloration: Skin is pale.  Neurological:     General: No focal deficit present.     Mental Status: She is alert and oriented to person, place, and time.  Psychiatric:        Mood and Affect: Mood normal.        Behavior: Behavior normal.          Scheduled Medications:   Chlorhexidine  Gluconate Cloth  6 each Topical Daily   enoxaparin  (LOVENOX ) injection  30 mg Subcutaneous Q24H   insulin  aspart  0-20 Units Subcutaneous Q6H   levothyroxine   200 mcg Intravenous q AM   pantoprazole   40 mg Intravenous Daily   simethicone   40 mg Per Tube BID   sodium chloride  flush  10-40 mL Intracatheter Q12H    Continuous Infusions:   potassium chloride  10 mEq (03/14/24 1354)   sodium bicarbonate  150 mEq in dextrose  5 % 1,150 mL infusion Stopped (03/14/24 0939)   TPN ADULT (ION) 60 mL/hr at 03/13/24 1826   TPN ADULT (ION)      PRN Medications:  acetaminophen  **OR** acetaminophen , bisacodyl , methocarbamol  (ROBAXIN ) injection, ondansetron  **OR** ondansetron  (ZOFRAN ) IV, sodium chloride  flush  Antimicrobials from admission:  Anti-infectives (From admission, onward)    None           Data Reviewed:  I have personally reviewed the following...  CBC: Recent Labs  Lab 03/10/24 0512 03/11/24 0938 03/12/24 0500 03/13/24 0441 03/14/24 0453  WBC 21.0* 23.4* 23.8* 24.6* 25.5*  NEUTROABS  --  20.9* 21.6* 22.6* 23.7*  HGB 10.3* 11.0* 10.4* 11.1* 10.4*  HCT 29.6* 32.6* 30.4* 33.8* 30.8*  MCV 96.4 98.8 97.4 99.7 97.5  PLT 194 184 176 179 152   Basic Metabolic Panel: Recent Labs  Lab 03/08/24 0520 03/09/24 1119 03/10/24 0512 03/11/24 0938 03/12/24 0500 03/13/24 0441 03/14/24 0453  NA 137   < > 136 134* 138 139 135  K 4.9   < > 4.8 5.0 3.5 4.1 2.8*  CL 103   < > 102 103 105 107 105  CO2 24   < > 20* 18* 15* 16* 13*  GLUCOSE 77   < > 91 123* 148* 114* 155*  BUN 94*   < > 124* 128* 111* 149* 150*  CREATININE 1.65*   < > 2.32* 2.46* 2.49* 2.61* 2.49*  CALCIUM  9.0   < > 8.4* 8.3* 8.4* 8.5* 8.3*  MG 1.8  --   --   --  1.9  --   --   PHOS 3.7  --  5.0*  --  5.8*  --  5.3*   < > = values in this interval not displayed.   GFR: Estimated Creatinine Clearance: 27.6 mL/min (A) (by C-G formula based on SCr of 2.49 mg/dL (H)). Liver Function Tests: Recent Labs  Lab 03/08/24 0520 03/09/24 1119 03/10/24 0512  AST 43* 43* 50*  ALT 8 8 10   ALKPHOS 78 90 90  BILITOT 0.6 0.5 0.7  PROT 5.7* 6.0* 5.5*  ALBUMIN  2.2* 2.3* 2.0*   No results for input(s): LIPASE, AMYLASE in the last 168 hours. No results for input(s): AMMONIA in the last 168 hours. Coagulation Profile: Recent Labs  Lab 03/10/24 0512   INR 1.0   Cardiac Enzymes: Recent Labs  Lab 03/09/24 1119  CKTOTAL 25*   BNP (last 3 results) No results for input(s): PROBNP in the last 8760 hours. HbA1C: No results for input(s): HGBA1C in the last 72 hours. CBG: Recent Labs  Lab 03/13/24 2000 03/14/24 0014 03/14/24 0443 03/14/24 0846 03/14/24 1238  GLUCAP 159* 176* 144* 152* 143*   Lipid Profile: Recent Labs    03/12/24 0500  TRIG 202*   Thyroid  Function Tests: No results for input(s): TSH, T4TOTAL, FREET4, T3FREE, THYROIDAB in the last 72 hours. Anemia Panel: No results for input(s): VITAMINB12, FOLATE, FERRITIN, TIBC, IRON, RETICCTPCT in the last 72 hours. Most Recent Urinalysis On File:     Component Value Date/Time   COLORURINE YELLOW (A) 03/09/2024 0948   APPEARANCEUR CLOUDY (A) 03/09/2024 0948   LABSPEC 1.016 03/09/2024 0948   PHURINE 5.0 03/09/2024 0948   GLUCOSEU NEGATIVE 03/09/2024 0948   HGBUR NEGATIVE 03/09/2024 0948   BILIRUBINUR NEGATIVE 03/09/2024 0948   KETONESUR NEGATIVE 03/09/2024 0948   PROTEINUR NEGATIVE 03/09/2024 0948   NITRITE NEGATIVE 03/09/2024 0948   LEUKOCYTESUR MODERATE (A) 03/09/2024 0948   Sepsis Labs: @LABRCNTIP (procalcitonin:4,lacticidven:4) Microbiology: No results found for this or any previous visit (from the past 240 hours).    Radiology Studies last 3 days: CT ABDOMEN PELVIS WO CONTRAST Result Date: 03/12/2024 EXAM: CT ABDOMEN AND PELVIS WITHOUT CONTRAST 03/12/2024 08:25:13 PM TECHNIQUE: CT of the abdomen and pelvis was performed without the administration of intravenous contrast. Multiplanar reformatted images are provided for review. Automated exposure control, iterative reconstruction, and/or weight based adjustment of the mA/kV was utilized to reduce the radiation dose to as low as reasonably achievable. COMPARISON: 02/29/2024 CLINICAL HISTORY: Abdominal pain, acute, nonlocalized. FINDINGS: LOWER CHEST: Moderate bilateral pleural effusions,  left greater than right, new/increased. LIVER: Cirrhosis. GALLBLADDER AND BILE DUCTS: Vicarious excretion of contrast in the  gallbladder. SPLEEN: No acute abnormality. PANCREAS: No acute abnormality. ADRENAL GLANDS: No acute abnormality. KIDNEYS, URETERS AND BLADDER: No stones in the kidneys or ureters. No hydronephrosis. No perinephric or periureteral stranding. Right posterolateral bladder wall thickening, suspicious for primary bladder carcinoma versus serosal metastases. GI AND BOWEL: Suspected normal appendix. Percutaneous gastrostomy and satisfactory position, new. Loculated ascites along the posterior gastric body measuring 5.0 x 8.0 cm, new. Mild free air beneath the anterior abdominal wall, likely post-procedural. PERITONEUM AND RETROPERITONEUM: Moderate abdominal pelvic ascites, mildly progressive. Mild omental nodularity beneath the left anterior abdominal wall, suggesting peritoneal disease. VASCULATURE: Aorta is normal in caliber. LYMPH NODES: Upper abdominal and retroperitoneal lymphadenopathy, including a dominant 2.2 cm protocaval node. Associated bilateral pelvic lymphadenopathy, including a 2.3 cm short axis left external iliac node. REPRODUCTIVE ORGANS: Status post hysterectomy. BONES AND SOFT TISSUES: Body wall edema. IMPRESSION: 1. Interval percutaneous gastrostomy in satisfactory position. Mild free air, likely postprocedural. 2. Moderate abdominal pelvic ascites, mildly progressive. Associated mild omental nodularity beneath the left anterior abdominal wall, suggesting peritoneal disease. 3. Right posterolateral bladder wall thickening, suspicious for primary bladder carcinoma versus serosal metastases. 4. Stable abdominopelvic nodal metastases. 5. Moderate bilateral pleural effusions, left greater than right, new/increased. 6. Additional ancillary findings as above. Electronically signed by: Pinkie Pebbles MD 03/12/2024 08:35 PM EDT RP Workstation: [id number]       Time spent: 50 min      Laneta Blunt, DO [facility] 03/14/2024, 2:28 PM    Dictation software may have been used to generate the above note. Typos may occur and escape review in typed/dictated notes. Please contact Dr Blunt directly for clarity if needed.  Staff may message me via secure chat in Epic  but this may not receive an immediate response,  please page me for urgent matters!  If 7PM-7AM, please contact night coverage [email or web address]

## 2024-03-14 NOTE — Progress Notes (Signed)
 Palliative Care Progress Note, Assessment & Plan   Patient Name: Tonya [name]       Date: 03/14/2024 DOB: 07-13-1966  Age: 58 y.o. MRN#: [id number] Attending Physician: Marsa Edelman, DO Primary Care Physician: Cleotilde Oneil FALCON, MD Admit Date: 03/09/2024  Subjective: Patient is lying in bed, resting with a fan on her face.  She easily awakens to my presence though keeps her eyes closed for the majority of our discussion.  Her RN is at bedside administering morning medications during my visit.  No family or friends present during my visit.  HPI: 58 y.o. female  with past medical history of  stage IV metastatic endometrial cancer status postchemotherapy, SIADH, hypothyroidism, HTN, CAD, gastric reflux, chronic HFpEF, and recent admission to [facility] (7/9 - 7/17) with tractable nausea with vomiting with gastric outlet obstruction s/p PEG tube placement (discharged home with PICC line, TPN, and PEG tube in place) admitted on 03/09/2024 with generalized weakness and fall.    Patient is being treated for generalized weakness and deconditioning, hypokalemia, metastatic endometrial cancer with gastric outlet obstruction s/p PEG tube, malnutrition, AKI, and hypothyroidism.   PMT was consulted to support patient with goals of care discussions.   Of note, patient is familiar to PMT/this provider as I met with patient during her previous hospitalization.  Summary of counseling/coordination of care: Extensive chart review completed prior to meeting patient including labs, vital signs, imaging, progress notes, orders, and available advanced directive documents from current and previous encounters.   After reviewing the patient's chart and assessing the patient at bedside, I spoke with patient in regards to symptom  management and goals of care.   Endorses she is exhausted.  She shares that just that she feels asleep someone else walks into the room.  Education provided on cluster care.  Counseled with RN to encourage care is groups together to minimize disruptions.  Patient complains of cramping.  Education provided on cramping likely be due to low potassium and magnesium  levels.  Potassium plans to be replaced today.  Additionally, discussed use of Robaxin  as a muscle relaxer to address symptoms.  Patient was in agreement.  Attending made aware and order placed.  No other adjustments to [facility] needed at this time.  Again discussed importance of adherence to PT/OT so that patient can be eligible for SNF rehab.  Patient Dors is understanding but shares she is very tired.  No further change to plan of care at this time.  PMT will continue to follow and support.  Physical Exam Vitals reviewed.  Constitutional:      General: She is not in acute distress.    Appearance: She is obese.  HENT:     Mouth/Throat:     Mouth: Mucous membranes are moist.  Eyes:     Pupils: Pupils are equal, round, and reactive to light.  Pulmonary:     Effort: Pulmonary effort is normal.  Abdominal:     Palpations: Abdomen is soft.  Skin:    General: Skin is warm and dry.     Coloration: Skin is pale.  Neurological:     Mental Status: She is alert and oriented to person, place, and time.  Psychiatric:  Behavior: Behavior normal.             Total Time 35 minutes   Time spent includes: Detailed review of medical records (labs, imaging, vital signs), medically appropriate exam (mental status, respiratory, cardiac, skin), discussed with treatment team, counseling and educating patient, family and staff, documenting clinical information, medication management and coordination of care.  Lamarr L. Arvid, DNP, FNP-BC Palliative Medicine Team

## 2024-03-14 NOTE — Hospital Course (Addendum)
 Hospital course / significant events:   HPI: Tonya [name] is a pleasant 58 y.o. female with medical history significant for metastatic stage IV endometrial cancer, CAD, gastric reflux, hypertension, gastric outlet obstruction s/p PEG tube who was recently admitted to [facility] and discharged between 02/29/2024 and 03/08/2024 after being treated for intractable nausea vomiting due to gastric mass s/p PEG tube.  Patient was discharged home on PICC line, TPN and PEG tube. Weakness since that discharge and presented again to ED.   07/18: to ED. Dx hyperkalemia 5.6, severe dehydration/AKI with BUN 117 and creatinine 2.33 and leukocytosis at 24.3 though leukocytosis appeared chronic/stable previous admission  07/19 - 07/21: fluctuating lytes, continue TPN 07/22: leak at PEG tube site today, IR following. Renal fxn worse Cr 2.6.  07/23: Cr to 2.5, low K, high Phos, stable WBC. Recs for STR/SNF. Placement will be difficult w/ TPN, TOC following  07/24: hypotensive overnight, started midodrine , gave bolus fluids, in AM still soft BP. ICU consulted - giving albumin , holding on thoracentesis given non-severe effusion. Onc consulted - not candidate for further tx. Code status updated to DNR/DNI, pt has asked us  not to discuss her condition w/ family right now.  07/25: Renal function continues to worsen, albumin  still low, BP low, Plt lower today, multiple conversations with patient about seriousness of her situation / deteriorating. She is not willing to let us  give details to her family - Dr Isadora and I spoke with her together this afternoon with husband present, she insists on waiting until her sister arrives hopefully tomorrow. See below re GOC discussion 07/26: family at bedside today, including pt's husband, pt's son, pt's siblings. Decision made for comfort measures.      Consultants:  Nephrology  Palliative Care  P[facility] Oncology   Procedures/Surgeries: none       ASSESSMENT &  PLAN:   COMFORT MEASURES ONLY STATUS  D/C labs, routine vitals, O2, TPN, non-comfort medications For now morphine /ativan  prn. If not controlling pain/air hunger can transition to morphine  drip, will leave IV access in place  Given evolving hypovolemic/cardiogenic shock, anticipate will not be stable for transport outside the hospital to hospice. Anticipate hospital death in next hours or much less likely days.      OTHER PERTINENT HOSPITAL PROBLEMS: discontinuing active treatment not focused on comfort measures  Acute kidney injury secondary to volume depletion Elevated and worsening BUN and Cr Metastatic endometrial cancer with gastric outlet obstruction s/p G tube for venting Abdominal ascites 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 Hypotension HFpEF SIRS Elevated HR likely compensating for low BP  Elevated RR likely compensating for metabolic acidosis Leukocytosis likely related to cancer  Worsening Thrombocytopenia likely related to inflammatory state  Elevated Alk Phos likely related to inflammatory state  Pleural effusion Leukocytosis with no signs of infection Hyperkalemia - resolved Hypokalemia G tube care Malnutrition due to unable to eat due to gastric outlet Hypothyroidism HFpEF GERD Generalized weakness/deconditioning Class 2 obesity based on BMI: Body mass index is 36.86 kg/m.SABRA

## 2024-03-14 NOTE — Progress Notes (Signed)
 Occupational Therapy Treatment Patient Details Name: Tonya [name] MRN: [id number] DOB: 04/04/1966 Today's Date: 03/14/2024   History of present illness 58 y/o female presented to ED on 03/09/24 for generalized weakness and fall. Recent hospitalization 7/9-7/17 for gastric mass s/p PEG tube. Admitted for dehydration. 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   OT comments  Pt is supine in bed on arrival. Pt immediately reports 10/10 back pain and increased fatigue after bathing in bed with nursing. She is agreeable to PT/OT co-tx session with max encouragement, but declined any EOB activity. Bed placed in chair position for ~20 mins during session. Pt performed oral care with set up assist and increased time d/t fatigue. She alternated between BLEs and BUEs with yellow theraband to maximize her strength/endurance and ability to perform ADLs. Pt stating I'm done, y'all get out. Pt did not wish to stay in chair position, therefore lowered head and raised feet with nurse present on therapy exit. She was left with all needs in place and will cont to require skilled acute OT services to maximize her safety and IND to return to PLOF.       If plan is discharge home, recommend the following:  A lot of help with walking and/or transfers;A lot of help with bathing/dressing/bathroom;Assistance with cooking/housework;Assist for transportation;Help with stairs or ramp for entrance   Equipment Recommendations  Other (comment) (defer)    Recommendations for Other Services      Precautions / Restrictions Precautions Precautions: Fall Recall of Precautions/Restrictions: Intact Precaution/Restrictions Comments: PEG (venting tube) on LUQ Restrictions Weight Bearing Restrictions Per Provider Order: No       Mobility Bed Mobility               General bed mobility comments: Deferred due to pain and fatigue, pt able to tolerate  bed in chair position for ~20 mins this date    Transfers                         Balance                                           ADL either performed or assessed with clinical judgement   ADL Overall ADL's : Needs assistance/impaired     Grooming: Oral care;Sitting;Bed level;Set up Grooming Details (indicate cue type and reason): bed in chair position                               General ADL Comments: pt declining any EOB activity this date d/t back pain and fatigue    Extremity/Trunk Assessment              Vision       Perception     Praxis     Communication Communication Communication: No apparent difficulties   Cognition Arousal: Alert Behavior During Therapy: WFL for tasks assessed/performed, Anxious                                 Following commands: Intact        Cueing   Cueing Techniques: Verbal cues, Gestural cues  Exercises Other Exercises Other Exercises: Provided yellow therband and edu on BUE strengthening exercises for  5 reps x 1 set as pt fatigues very easily and is increasingly weak. Edu on performing 3x/day to maximize strength.    Shoulder Instructions       General Comments sp02 100% on RA, HR 104bpm    Pertinent Vitals/ Pain       Pain Assessment Pain Assessment: 0-10 Pain Score: 10-Worst pain ever Pain Location: back pain Pain Descriptors / Indicators: Aching, Discomfort Pain Intervention(s): Monitored during session, Repositioned, Limited activity within patient's tolerance, Patient requesting pain meds-RN notified  Home Living                                          Prior Functioning/Environment              Frequency  Min 2X/week        Progress Toward Goals  OT Goals(current goals can now be found in the care plan section)  Progress towards OT goals: Progressing toward goals  Acute Rehab OT Goals Patient Stated Goal: get  stronger OT Goal Formulation: With patient Time For Goal Achievement: 03/24/24 Potential to Achieve Goals: Good  Plan      Co-evaluation    PT/OT/SLP Co-Evaluation/Treatment: Yes Reason for Co-Treatment: To address functional/ADL transfers;For patient/therapist safety;Other (comment) PT goals addressed during session: Strengthening/ROM OT goals addressed during session: Strengthening/ROM      AM-PAC OT 6 Clicks Daily Activity     Outcome Measure   Help from another person eating meals?: None Help from another person taking care of personal grooming?: None Help from another person toileting, which includes using toliet, bedpan, or urinal?: A Lot Help from another person bathing (including washing, rinsing, drying)?: A Lot Help from another person to put on and taking off regular upper body clothing?: A Little Help from another person to put on and taking off regular lower body clothing?: A Lot 6 Click Score: 17    End of Session    OT Visit Diagnosis: Unsteadiness on feet (R26.81);Other abnormalities of gait and mobility (R26.89);History of falling (Z91.81);Muscle weakness (generalized) (M62.81)   Activity Tolerance Patient limited by fatigue;Patient limited by pain   Patient Left in bed;with call bell/phone within reach;with bed alarm set;with nursing/sitter in room   Nurse Communication Mobility status        Time: 8961-8893 OT Time Calculation (min): 28 min  Charges: OT General Charges $OT Visit: 1 Visit OT Treatments $Therapeutic Exercise: 8-22 mins  Imanni Burdine, OTR/L  03/14/24, 12:20 PM   Denicia Pagliarulo E Glorimar Stroope 03/14/2024, 12:16 PM

## 2024-03-14 NOTE — Progress Notes (Signed)
 Physical Therapy Treatment Patient Details Name: Tonya [name] MRN: [id number] DOB: 1966/02/07 Today's Date: 03/14/2024   History of Present Illness 58 y/o female presented to ED on 03/09/24 for generalized weakness and fall. Recent hospitalization 7/9-7/17 for gastric mass s/p PEG tube. Admitted for dehydration. 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    PT Comments  PT/OT performed co-treatment to maximize pt/staff safety and to increase therapeutic benefit without pt's overexertion. Upon entry pt stated increased pain (10/10 in low back), after a few minutes of discussing plans and therapeutic benefit, she agreed to session. PT focused today's session on increasing activity tolerance and BLE strengthening to continue to work toward's pt's stated goals. Pt was able to tolerate 23 minutes of seated with bed set to chair position, increasing tolerance to upright posture. During therapeutic exercise, pt required increased rest breaks to catch her breath, and regain energy to performed exercises listed below. RN entered room at the end of the session to provide medications, (RN made aware of pain mentioned during session). Pt would benefit from skilled PT to continue to work towards PT goals and return to PLOF.    If plan is discharge home, recommend the following: Assist for transportation;Help with stairs or ramp for entrance;Two people to help with walking and/or transfers;Two people to help with bathing/dressing/bathroom   Can travel by private vehicle     No  Equipment Recommendations  Other (comment) (TBD)    Recommendations for Other Services       Precautions / Restrictions Precautions Precautions: Fall Recall of Precautions/Restrictions: Intact Precaution/Restrictions Comments: PEG (venting tube) on LUQ Restrictions Weight Bearing Restrictions Per Provider Order: No     Mobility  Bed Mobility     General  bed mobility comments: Deferred due to pain, pt able to tolerate sitting position for ~23 min    Transfers   General transfer comment: Deferred secondary to reported pain    Ambulation/Gait     General Gait Details: Deferred secondary to reported pain     Balance   Sitting balance - Comments: Pt placed in sitting position to perform exercises and ADLs      Communication Communication Communication: No apparent difficulties  Cognition Arousal: Alert Behavior During Therapy: WFL for tasks assessed/performed, Anxious   PT - Cognitive impairments: No apparent impairments     Following commands: Intact      Cueing Cueing Techniques: Verbal cues, Gestural cues  Exercises General Exercises - Lower Extremity Ankle Circles/Pumps: Supine, AROM, Both, 20 reps, Strengthening Short Arc Quad: AROM, Strengthening, Both, 10 reps, Supine (Increased rest breaks between sets)    General Comments General comments (skin integrity, edema, etc.): Vitals during ther ex: HR: 104 bpm, SpO2: 100% on room air PEG(vented): dry, clean and intact throughout session.       Pertinent Vitals/Pain Pain Assessment Pain Assessment: 0-10 Pain Score: 10-Worst pain ever Pain Location: back pain Pain Descriptors / Indicators: Aching, Discomfort Pain Intervention(s): Monitored during session, Limited activity within patient's tolerance, Patient requesting pain meds-RN notified (RN at the end of session to address pain)     PT Goals (current goals can now be found in the care plan section) Acute Rehab PT Goals Patient Stated Goal: to get stronger PT Goal Formulation: With patient/family Time For Goal Achievement: 03/24/24 Potential to Achieve Goals: Fair Progress towards PT goals: Progressing toward goals    Frequency    Min 2X/week  Co-evaluation PT/OT/SLP Co-Evaluation/Treatment: Yes Reason for Co-Treatment: To address functional/ADL transfers;For patient/therapist safety;Other  (comment) PT goals addressed during session: Mobility/safety with mobility;Strengthening/ROM OT goals addressed during session: ADL's and self-care;Strengthening/ROM      AM-PAC PT 6 Clicks Mobility   Outcome Measure  Help needed turning from your back to your side while in a flat bed without using bedrails?: Total Help needed moving from lying on your back to sitting on the side of a flat bed without using bedrails?: Total Help needed moving to and from a bed to a chair (including a wheelchair)?: Total Help needed standing up from a chair using your arms (e.g., wheelchair or bedside chair)?: Total Help needed to walk in hospital room?: Total Help needed climbing 3-5 steps with a railing? : Total 6 Click Score: 6    End of Session   Activity Tolerance: Patient limited by fatigue;Patient limited by pain Patient left: in bed;with call bell/phone within reach;with bed alarm set;with nursing/sitter in room Nurse Communication: Mobility status;Patient requests pain meds PT Visit Diagnosis: Unsteadiness on feet (R26.81);Muscle weakness (generalized) (M62.81);Difficulty in walking, not elsewhere classified (R26.2);History of falling (Z91.81);Other abnormalities of gait and mobility (R26.89)     Time: 8961-8891 PT Time Calculation (min) (ACUTE ONLY): 30 min  Charges:                            Langford Carias, SPT 03/14/24, 11:38 AM

## 2024-03-14 NOTE — Consult Note (Signed)
 PHARMACY - TOTAL PARENTERAL NUTRITION CONSULT NOTE   Indication: Small bowel obstruction  Patient Measurements: Height: 5' 4 (162.6 cm) Weight: 97.4 kg (214 lb 11.7 oz) IBW/kg (Calculated) : 54.7 TPN AdjBW (KG): 62.3 Body mass index is 36.86 kg/m. Usual Weight: N/A  Assessment:  58 y.o. 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 Patient discharged yesterday with plans to follow up with oncology, with new chemotherapy regimen set to initiate 7/22. Patient now back today after weakness with fall at home, now with AKI.  Update 7/22: AKI continues to worsen. Nephrology consulted.  Glucose / Insulin : no history of DM BG 116. Dexamethasone  completed Insulin  requirements the last 24h: 7 > 12 > 3 units > 14 units Electrolytes: K 5.0>3.5>4.1>2.8 Ordered potassium chloride  per 50mL IV x 6 Renal: Scr 2.33>2.46>2.49>2.61>2.49 Baseline 0.6-0.8 Phos 3.7 (7/17) > 5.0 > 5.8 (7/21) Hepatic: AST slightly elevated at 43 > 50 TG 356 (7/14) > 184 > 202 (7/21) Intake / Output: History of heart failure mIVF  >> now switched to sodium bicarb 150 mEq in D5W @ 100 cc/hr  GI Imaging: US  renal 7/17: Mild left hydronephrosis, unchanged from 7/9  CT abdomen/pelvis 7/11:  1. Interval marked fluid distension of the stomach with irregular wall thickening of the antral pyloric region with mucosal enhancement with otherwise decompressed appearance of the bowel, outlet obstruction or functional obstruction of the stomach cannot be excluded. NG decompression is recommended 2. Slight increased ascites within the abdomen and pelvis, now small-moderate volume. No free air. 3. New mild left-sided hydronephrosis and hydroureter, with more normal appearing caliber of distal left ureter in the pelvis, anterior to the iliac vessels. No obstructing  stone. Findings are consistent with distal ureteral obstruction. Decreased excretion of contrast from the left kidney on delayed views. 4. Nodular soft tissue thickening at the right posterior bladder, stable in the short interim but suspicious for neoplasm/metastatic disease. 5. Extensive abdominal, retroperitoneal and pelvic adenopathy consistent with metastatic disease. 6. 6 mm right lower lobe pulmonary nodule, stable, and also concerning for metastatic disease. 7. Aortic atherosclerosis.  GI Surgeries / Procedures:  EGD done 7/10 7/14: PERCUTANEOUS (VENTING) G-TUBE PLACEMENT (3600 ml of fluid removed)  Central access: 7/11 TPN start date: 7/11  Nutritional Goals: Goal TPN rate 60 mL/hr (provides 102 g of protein and 1906 kcals per day)  7/15: Trying to keep fluid low due to HFpEF and ascites. Provider wants fluid increase due to bump in Scr 7/16: Dr Caleen and Augustin (clinical dietitian) agreed on keeping TPN rate at 39ml/hr.  RD Assessment: Consulted. Most recent note 7/19; per discussion with RD, patient is third spacing and we ant to be volume-sparing when possible with additional fluids. Total Energy Estimated Needs: 2000-2300kcal/day Total Protein Estimated Needs: 100-115g/day Total Fluid Estimated Needs: 1.4-1.6L/day  Current Nutrition: NPO Pt has tube but only for venting stomach, not for med administration  Plan:  Continue TPN rate at 60 mL/hr  Electrolytes in TPN: Na 45 mEq/L, K 40 mEq/L (increased 7/23), Ca 32mEq/L, Mg 84mEq/L, and Phos 5 mmol/L (decreased 7/21) Max acetate to help with worsening acidosis. IVF switched to sodium bicarb 150mEq in D5W per nephrology Add standard MVI and trace elements to TPN. Continue Resistant q6h SSI and insulin  10 units in TPN. Monitor TPN labs on Mon/Thurs   Will M. Lenon, PharmD Clinical Pharmacist 03/14/2024 7:42 AM

## 2024-03-15 ENCOUNTER — Inpatient Hospital Stay

## 2024-03-15 DIAGNOSIS — R579 Shock, unspecified: Secondary | ICD-10-CM

## 2024-03-15 DIAGNOSIS — J9601 Acute respiratory failure with hypoxia: Secondary | ICD-10-CM | POA: Diagnosis not present

## 2024-03-15 DIAGNOSIS — J90 Pleural effusion, not elsewhere classified: Secondary | ICD-10-CM | POA: Diagnosis not present

## 2024-03-15 DIAGNOSIS — I5031 Acute diastolic (congestive) heart failure: Secondary | ICD-10-CM

## 2024-03-15 DIAGNOSIS — C541 Malignant neoplasm of endometrium: Secondary | ICD-10-CM | POA: Diagnosis not present

## 2024-03-15 DIAGNOSIS — E860 Dehydration: Secondary | ICD-10-CM | POA: Diagnosis not present

## 2024-03-15 LAB — BASIC METABOLIC PANEL WITH GFR
Anion gap: 19 — ABNORMAL HIGH (ref 5–15)
BUN: 165 mg/dL — ABNORMAL HIGH (ref 6–20)
CO2: 15 mmol/L — ABNORMAL LOW (ref 22–32)
Calcium: 8.2 mg/dL — ABNORMAL LOW (ref 8.9–10.3)
Chloride: 101 mmol/L (ref 98–111)
Creatinine, Ser: 3.02 mg/dL — ABNORMAL HIGH (ref 0.44–1.00)
GFR, Estimated: 17 mL/min — ABNORMAL LOW (ref >60)
Glucose, Bld: 156 mg/dL — ABNORMAL HIGH (ref 70–99)
Potassium: 4 mmol/L (ref 3.5–5.1)
Sodium: 135 mmol/L (ref 135–145)

## 2024-03-15 LAB — GLUCOSE, CAPILLARY
Glucose-Capillary: 103 mg/dL — ABNORMAL HIGH (ref 70–99)
Glucose-Capillary: 104 mg/dL — ABNORMAL HIGH (ref 70–99)
Glucose-Capillary: 127 mg/dL — ABNORMAL HIGH (ref 70–99)
Glucose-Capillary: 135 mg/dL — ABNORMAL HIGH (ref 70–99)
Glucose-Capillary: 159 mg/dL — ABNORMAL HIGH (ref 70–99)
Glucose-Capillary: 169 mg/dL — ABNORMAL HIGH (ref 70–99)
Glucose-Capillary: 175 mg/dL — ABNORMAL HIGH (ref 70–99)

## 2024-03-15 LAB — PHOSPHORUS: Phosphorus: 5.8 mg/dL — ABNORMAL HIGH (ref 2.5–4.6)

## 2024-03-15 LAB — BLOOD GAS, VENOUS
Acid-base deficit: 8.5 mmol/L — ABNORMAL HIGH (ref 0.0–2.0)
Bicarbonate: 18.4 mmol/L — ABNORMAL LOW (ref 20.0–28.0)
O2 Saturation: 17 %
Patient temperature: 37
pCO2, Ven: 42 mmHg — ABNORMAL LOW (ref 44–60)
pH, Ven: 7.25 (ref 7.25–7.43)

## 2024-03-15 LAB — CBC
HCT: 29.7 % — ABNORMAL LOW (ref 36.0–46.0)
Hemoglobin: 9.8 g/dL — ABNORMAL LOW (ref 12.0–15.0)
MCH: 32.6 pg (ref 26.0–34.0)
MCHC: 33 g/dL (ref 30.0–36.0)
MCV: 98.7 fL (ref 80.0–100.0)
Platelets: 118 K/uL — ABNORMAL LOW (ref 150–400)
RBC: 3.01 MIL/uL — ABNORMAL LOW (ref 3.87–5.11)
RDW: 17.7 % — ABNORMAL HIGH (ref 11.5–15.5)
WBC: 29.1 K/uL — ABNORMAL HIGH (ref 4.0–10.5)
nRBC: 0.1 % (ref 0.0–0.2)

## 2024-03-15 LAB — MAGNESIUM: Magnesium: 1.8 mg/dL (ref 1.7–2.4)

## 2024-03-15 LAB — ALBUMIN: Albumin: 2.4 g/dL — ABNORMAL LOW (ref 3.5–5.0)

## 2024-03-15 MED ORDER — SODIUM CHLORIDE 0.9 % IV BOLUS
500.0000 mL | Freq: Once | INTRAVENOUS | Status: AC
  Administered 2024-03-15: 500 mL via INTRAVENOUS

## 2024-03-15 MED ORDER — MIDODRINE HCL 5 MG PO TABS
10.0000 mg | ORAL_TABLET | Freq: Once | ORAL | Status: AC
  Administered 2024-03-15: 10 mg via ORAL
  Filled 2024-03-15: qty 2

## 2024-03-15 MED ORDER — SODIUM BICARBONATE 8.4 % IV SOLN
50.0000 meq | Freq: Once | INTRAVENOUS | Status: AC
  Administered 2024-03-16: 50 meq via INTRAVENOUS
  Filled 2024-03-15: qty 50

## 2024-03-15 MED ORDER — ALBUMIN HUMAN 25 % IV SOLN
50.0000 g | Freq: Once | INTRAVENOUS | Status: AC
  Administered 2024-03-15: 50 g via INTRAVENOUS
  Filled 2024-03-15: qty 200

## 2024-03-15 MED ORDER — TRACE MINERALS CU-MN-SE-ZN 300-55-60-3000 MCG/ML IV SOLN
INTRAVENOUS | Status: AC
  Filled 2024-03-15: qty 681.6

## 2024-03-15 NOTE — Progress Notes (Signed)
   03/15/24 1125  Assess: MEWS Score  Temp (!) 97.3 F (36.3 C)  BP (!) 78/52  Pulse Rate (!) 105  Resp (!) 28  SpO2 99 %  O2 Device Room Air  Assess: MEWS Score  MEWS Temp 0  MEWS Systolic 2  MEWS Pulse 1  MEWS RR 2  MEWS LOC 0  MEWS Score 5  MEWS Score Color Red  Assess: if the MEWS score is Yellow or Red  Were vital signs accurate and taken at a resting state? Yes  Does the patient meet 2 or more of the SIRS criteria? Yes  Does the patient have a confirmed or suspected source of infection? No  MEWS guidelines implemented  Yes, red  Treat  MEWS Interventions Considered administering scheduled or prn medications/treatments as ordered  Take Vital Signs  Increase Vital Sign Frequency  Red: Q1hr x2, continue Q4hrs until patient remains green for 12hrs  Escalate  MEWS: Escalate Red: Discuss with charge nurse and notify provider. Consider notifying RRT. If remains red for 2 hours consider need for higher level of care  Notify: Charge Nurse/RN  Name of Charge Nurse/RN Notified RN Brandi  Provider Notification  Provider Name/Title Marsa Edelman DO  Date Provider Notified 03/15/24  Time Provider Notified 1125  Method of Notification Face-to-face  Notification Reason Other (Comment) (pt is red mews)  Provider response No new orders (do not need to call rapid.)  Date of Provider Response 03/15/24  Time of Provider Response 1145  Assess: SIRS CRITERIA  SIRS Temperature  0  SIRS Respirations  1  SIRS Pulse 1  SIRS WBC 1  SIRS Score Sum  3

## 2024-03-15 NOTE — IPAL (Signed)
  Interdisciplinary Goals of Care Family Meeting   Date carried out: 03/15/2024  Location of the meeting: Bedside  Member's involved: Physician and Family Member or next of kin  Durable Power of Attorney or acting medical decision maker: Patient, Tonya [name]    Discussion: We discussed goals of care for [name] .  I discussed with [name] that her overall condition is progressed, and she has stage IV cancer with obstruction of her stomach. Explained that in her condition, not much more can be done from a cancer perspective given she is on total parenteral nutrition and her functioned status has quite deteriorated. She would like time to discuss with her family, and I stressed on her the urgency of the situation, and the need to discuss as soon as possible, as it appears they are unaware of the gravity of the situation.  Also discussed with her that placement of a breathing tube would make it impossible to have this conversation with her family. Similarly, chest compressions should she have a cardiac arrest would not serve that purpose. She is interested in going home with hospice so she could make end of life arrangements. She would not want to be intubated or resuscitated should she have worsening in her medical condition.  Code status: Change to DNR/DNI  Disposition: Home with Hospice  Time spent for the meeting: 15 minutes    Belva November, MD  03/15/2024, 11:35 AM

## 2024-03-15 NOTE — Consult Note (Signed)
 NAME:  Tonya [name], MRN:  [id number], DOB:  1966-01-18, LOS: 5 ADMISSION DATE:  03/09/2024, CONSULTATION DATE:  03/15/2024 REFERRING MD:  Laneta Blunt, DO, CHIEF COMPLAINT:  Hypotension   History of Present Illness:   Patient is a pleasant 58 year old female with a history of metastatic endometrial cancer with enuroendocrine features who is admitted for hypotension and a fall. PCCM is consulted regarding hypoxia and hypotension.  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  She was recently admitted to the hospital on 02/29/2024 for complaint of nausea and vomiting.  She was found to have a mass in the stomach on CT which was confirmed with EGD (biopsy of stomach mass showed poorly differentiated carcinoma with neuroendocrine features consistent with metastases).  Given gastric outlet obstruction, she had a PEG tube placed for decompression and was started on palliative TPN following which she was discharged home.  She unfortunately suffered a fall the night of discharge and was readmitted the following day to the hospital.  Patient was readmitted on 03/09/2024 with a fall.  On arrival, she was dehydrated with an acute kidney injury and multiple electrolyte abnormalities.  Management has been conservative this hospitalization with IV fluids and TPN.  Over the past 24 to 48 hours, her oxygen requirements have increased and she is noted to be hypotensive.  A CT scan of the abdomen was obtained which showed bilateral pleural effusions on the lung windows on 7/21.  PCCM was consulted for evaluation further management.  Chart reviewed and case discussed extensively with the patient's primary physician.  We have also involved oncology and palliative care in discussions.  It is my understanding that the patient has been unable to have conversations regarding goals of care and her overall status with her family members.  At the bedside today,  she is short of breath but otherwise symptoms are unchanged.  She does not have any abdominal pain.  She does have a cough.  She is sitting comfortably in bed but does report some back pain.  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 rate 18, height 5' 4 (1.626 m), weight 97.1 kg, last menstrual period 07/22/2016, SpO2 99%.        Intake/Output Summary (Last 24 hours) at 03/15/2024 0930 Last data filed at 03/15/2024 0546 Gross per 24 hour  Intake 2974.66 ml  Output 1850 ml  Net 1124.66 ml   Filed Weights   03/13/24 0500 03/14/24 0500 03/15/24 0546  Weight: 92.4 kg 97.4 kg 97.1 kg    Examination: Physical Exam Constitutional:      Appearance: She is obese. She is ill-appearing.  Cardiovascular:     Rate and Rhythm: Regular rhythm. Tachycardia present.     Pulses: Normal pulses.     Heart sounds: Normal heart sounds.  Pulmonary:     Breath sounds: Rales present.  Musculoskeletal:     Right lower leg: Edema present.     Left lower leg: Edema present.  Neurological:     General: No focal deficit present.     Mental Status: She is alert and oriented to person, place, and time. Mental status is at baseline.     Assessment and Plan   #Circulatory Shock  #Malnutrition #Acute Hypoxic Respiratory  Failure #Bilateral Pleural Effusions #Metastatic Stage IV Endometrial Carcinoma #AKI #Acute HFpEF  58 year old female with known metastatic endometrial cancer resulting in gastric outlet obstruction.  This is required a PEG for decompression.  She is admitted after a fall and found to have AKI.  Course has been complicated by decompensated heart failure secondary to resuscitation with IV fluids.  Neuro - pain control per primary  team. She is able to make her own decisions but this is likely to change as uremia worsens. Would involve family in decision making. CV - decompensated HFpEF given IV fluids, AKI, and overall poor nutritional status resulting in decreased oncotic pressures (albumin  of 2). Remains hypotensive and I've ordered 50 grams of albumin . She is at high risk for further decompensation given continued TPN. I will discontinue her IV fluids at this point. Pulm - respiratory failure is due to pulmonary edema and bilateral pleural effusions due to decompensated heart failure. I have discussed this with her at length, and recommended against intubation and mechanical ventilation as this would not change the over all outcome and would be contrary to her wishes of spending time with family. Could consider non-invasive ventilation for comfort. Oxygen via nasal cannula for now with goal SpO2 > 92%. 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 She has a PEG to vent the stomach. Overall poor prognosis and TPN is unlikely to change her outcome. Renal - worsening renal dysfunction and uremia. This failed to improve with IV fluids (sodium bicarb). She continues to make urine and post obstructive uropathy is unlikely. Endo - levothyroxine  for hypothyroidism. Hem/Onc - Stage IV endometrial ca with gastric outlet obstruction. Her overall worsening status precludes her from tolerating further cancer directed therapies. Palliative care is involved and this was discussed with the patient by multiple providers. I have recommended to her that she not have a code blue called, with futility of intubation/mechanical ventilation as well as chest compressions. Recommend consideration for home with hospice. ID - no sign of active infection at this point Dispo - DNR/DNI, would recommend against ICU transfer or initiation of vasopressors given poor prognosis and futility. Patient is open and interested in being discharged home with  hospice. I am concerned that her family is not aware of the gravity of the situation.  Labs   CBC: Recent Labs  Lab 03/11/24 0938 03/12/24 0500 03/13/24 0441 03/14/24 0453 03/15/24 0839  WBC 23.4* 23.8* 24.6* 25.5* 29.1*  NEUTROABS 20.9* 21.6* 22.6* 23.7*  --   HGB 11.0* 10.4* 11.1* 10.4* 9.8*  HCT 32.6* 30.4* 33.8* 30.8* 29.7*  MCV 98.8 97.4 99.7 97.5 98.7  PLT 184 176 179 152 118*    Basic Metabolic Panel: Recent Labs  Lab 03/10/24 0512 03/11/24 0938 03/12/24 0500 03/13/24 0441 03/14/24 0453 03/14/24 2029  NA 136 134* 138 139 135  --   K 4.8 5.0 3.5 4.1 2.8* 4.4  CL 102 103 105 107 105  --   CO2 20* 18* 15* 16* 13*  --   GLUCOSE 91 123* 148* 114* 155*  --   BUN 124* 128* 111* 149* 150*  --   CREATININE 2.32* 2.46* 2.49* 2.61* 2.49*  --   CALCIUM  8.4* 8.3* 8.4* 8.5* 8.3*  --   MG  --   --  1.9  --   --   --   PHOS 5.0*  --  5.8*  --  5.3*  --    GFR: Estimated Creatinine Clearance: 27.5 mL/min (A) (by C-G formula based  on SCr of 2.49 mg/dL (H)). Recent Labs  Lab 03/12/24 0500 03/13/24 0441 03/14/24 0453 03/15/24 0839  WBC 23.8* 24.6* 25.5* 29.1*    Liver Function Tests: Recent Labs  Lab 03/09/24 1119 03/10/24 0512  AST 43* 50*  ALT 8 10  ALKPHOS 90 90  BILITOT 0.5 0.7  PROT 6.0* 5.5*  ALBUMIN  2.3* 2.0*   No results for input(s): LIPASE, AMYLASE in the last 168 hours. No results for input(s): AMMONIA in the last 168 hours.  ABG No results found for: PHART, PCO2ART, PO2ART, HCO3, TCO2, ACIDBASEDEF, O2SAT   Coagulation Profile: Recent Labs  Lab 03/10/24 0512  INR 1.0    Cardiac Enzymes: Recent Labs  Lab 03/09/24 1119  CKTOTAL 25*    HbA1C: Hgb A1c MFr Bld  Date/Time Value Ref Range Status  10/03/2023 06:33 AM 6.5 (H) 4.8 - 5.6 % Final    Comment:    (NOTE) Pre diabetes:          5.7%-6.4%  Diabetes:              >6.4%  Glycemic control for   <7.0% adults with diabetes   07/23/2019 08:57 AM 7.7 (H) 4.8 -  5.6 % Final    Comment:    (NOTE) Pre diabetes:          5.7%-6.4% Diabetes:              >6.4% Glycemic control for   <7.0% adults with diabetes     CBG: Recent Labs  Lab 03/14/24 1738 03/14/24 2008 03/15/24 0010 03/15/24 0547 03/15/24 0757  GLUCAP 108* 137* 159* 175* 169*    Review of Systems:   Review of Systems  Constitutional:  Negative for chills and fever.  Respiratory:  Positive for shortness of breath. Negative for cough.   Cardiovascular:  Negative for chest pain.     Past Medical History:  She,  has a past medical history of Cancer (HCC), Coronary artery disease, GERD (gastroesophageal reflux disease), Hypertension, Hypothyroidism, Myocardial infarction (HCC), and Thyroid  disease.   Surgical History:   Past Surgical History:  Procedure Laterality Date   CARDIAC CATHETERIZATION     CESAREAN SECTION     CORONARY STENT INTERVENTION N/A 07/24/2019   Procedure: CORONARY STENT INTERVENTION;  Surgeon: Mady Bruckner, MD;  Location: [facility] INVASIVE CV LAB;  Service: Cardiovascular;  Laterality: N/A;  RCA & CFX   ESOPHAGOGASTRODUODENOSCOPY N/A 03/01/2024   Procedure: EGD (ESOPHAGOGASTRODUODENOSCOPY);  Surgeon: Jinny Carmine, MD;  Location: [facility] ENDOSCOPY;  Service: Endoscopy;  Laterality: N/A;   IR GASTROSTOMY TUBE MOD SED  03/05/2024   IR IMAGING GUIDED PORT INSERTION  09/23/2023   IR PARACENTESIS  03/05/2024   LEFT HEART CATH AND CORONARY ANGIOGRAPHY N/A 07/24/2019   Procedure: LEFT HEART CATH AND CORONARY ANGIOGRAPHY;  Surgeon: Hester Wolm PARAS, MD;  Location: [facility] INVASIVE CV LAB;  Service: Cardiovascular;  Laterality: N/A;   TONSILLECTOMY       Social History:   reports that she has never smoked. She has never used smokeless tobacco. She reports that she does not currently use alcohol. She reports that she does not use drugs.   Family History:  Her family history includes Atrial fibrillation in her mother; Colon cancer in her mother; Heart Problems in her father;  Heart attack in her father; Heart disease in her father.   Allergies No Known Allergies   Home Medications  Prior to Admission medications   Medication Sig Start Date End Date Taking? Authorizing Provider  bisacodyl  (  DULCOLAX) 10 MG suppository Place 1 suppository (10 mg total) rectally daily as needed for moderate constipation. 03/08/24  Yes Caleen Qualia, MD  levothyroxine  (SYNTHROID , LEVOTHROID) 100 MCG/5ML SOLN injection Inject 10 mLs (200 mcg total) into the vein in the morning. 03/09/24  Yes Caleen Qualia, MD  ondansetron  (ZOFRAN ) 4 MG/2ML SOLN injection Inject 2 mLs (4 mg total) into the vein every 6 (six) hours as needed for nausea or vomiting. 03/08/24  Yes Caleen Qualia, MD  pantoprazole  (PROTONIX ) 40 MG injection Inject 40 mg into the vein daily. 03/08/24  Yes Caleen Qualia, MD     The patient is critically ill due to hypotension, respiratory failure.  Critical care was necessary to treat or prevent imminent or life-threatening deterioration. Critical care time was spent by me on the following activities: development of a treatment plan with the patient and/or surrogate as well as nursing, discussions with consultants, evaluation of the patient's response to treatment, examination of the patient, obtaining a history from the patient or surrogate, ordering and performing treatments and interventions, ordering and review of laboratory studies, ordering and review of radiographic studies, review of telemetry data including pulse oximetry, re-evaluation of patient's condition and participation in multidisciplinary rounds.   I personally spent 45 minutes providing critical care not including any separately billable procedures.   Belva November, MD [facility] 03/15/2024 1:41 PM

## 2024-03-15 NOTE — Progress Notes (Addendum)
 Palliative Care Progress Note, Assessment & Plan   Patient Name: Tonya [name]       Date: 03/15/2024 DOB: [birth year]  Age: 58 y.o. MRN#: [id number] Attending Physician: Marsa Edelman, DO Primary Care Physician: Cleotilde Oneil FALCON, MD Admit Date: 03/09/2024  Subjective: Patient is lying in bed, resting in no apparent distress.  Her eyes are closed but she easily awakens to my presence.  No family or friends present during my visit.  HPI: 58 y.o. female  with past medical history of  stage IV metastatic endometrial cancer status postchemotherapy, SIADH, hypothyroidism, HTN, CAD, gastric reflux, chronic HFpEF, and recent admission to [facility] (7/9 - 7/17) with tractable nausea with vomiting with gastric outlet obstruction s/p PEG tube placement (discharged home with PICC line, TPN, and PEG tube in place) admitted on 03/09/2024 with generalized weakness and fall.    Patient is being treated for generalized weakness and deconditioning, hypokalemia, metastatic endometrial cancer with gastric outlet obstruction s/p PEG tube, malnutrition, AKI, and hypothyroidism.   PMT was consulted to support patient with goals of care discussions.   Of note, patient is familiar to PMT/this provider as I met with patient during her previous hospitalization.  Summary of counseling/coordination of care: Extensive chart review completed prior to meeting patient including labs, vital signs, imaging, progress notes, orders, and available advanced directive documents from current and previous encounters.   After reviewing the patient's chart and assessing the patient at bedside, I spoke with patient in regards to symptom management and goals of care.   I discussed boundaries and goals of care with patient.  Patient's values and goals.   She shares that she hopes to get things in order with her family.  She speaks of passwords and login information that she has not shared with her family.  Discussed that patient appears to be planning for her family for ife without her. Patient is contemplating her own mortality and planning for life after her death. Discussed human mortality, end-of-life expectations, and patient's current illness as well as chronic issues.    Discussed importance of making medical boundaries clear so that medical team knows how to move forward with her plan of care.  I highlighted that caring for her family is important but that it cannot be a distraction for making decisions for herself.  After extended discussion of her wishes, In the event of a cardiopulmonary arrest, patient shares that she would never be accepting of ventilatory support.  She shares she never wants to be placed on the ventilator.  There difference between full code, DNR with full interventions, DNR with limited interventions discussed in detail.  Patient endorses again that she would not want to be placed on a ventilator or be kept alive by artificial means.  However, she wishes to speak further with her family in regards to changes to her plan of care.  I reiterated the importance of patient's autonomy and agency over her medical decision making.  Family support and understanding is important but ultimately it is patient's wishes for her care.  She shares understanding and appreciation.  She has what services that are available and paid for by her insurance if she were to go home.  I shared that [facility] can follow-up with patient in regards to financial planning/insurance coverage.  She request that I return at a time when her family is at bedside.  I offered to speak with her husband and her son.  She was adamantly against me calling her son.  She shares that she plans to speak with him next month.  I shared concerns that patient's current medical  condition will not allow for her to wait to share her acute concerns at this time. She shared understanding but politely declines for me to speak with her family.  Above conveyed to attending and CCM.  I plan to follow-up with patient when family is at bedside.  Attending shares she will notify EMT when family is at bedside.  Physical Exam Vitals reviewed.  Constitutional:      General: She is not in acute distress.    Appearance: She is obese.  HENT:     Head: Normocephalic.     Mouth/Throat:     Mouth: Mucous membranes are moist.  Cardiovascular:     Rate and Rhythm: Normal rate.  Pulmonary:     Comments: Conversational dyspnea Skin:    General: Skin is dry.     Coloration: Skin is pale.  Neurological:     Mental Status: She is alert and oriented to person, place, and time.  Psychiatric:        Behavior: Behavior normal.             Total Time 50 minutes   Time spent includes: Detailed review of medical records (labs, imaging, vital signs), medically appropriate exam (mental status, respiratory, cardiac, skin), discussed with treatment team, counseling and educating patient, family and staff, documenting clinical information, medication management and coordination of care.  Lamarr L. Arvid, DNP, FNP-BC Palliative Medicine Team

## 2024-03-15 NOTE — Progress Notes (Signed)
   03/14/24 2008  Assess: MEWS Score  BP (!) 72/57  MAP (mmHg) (!) 63  Pulse Rate (!) 109  SpO2 99 %  Assess: MEWS Score  MEWS Temp 0  MEWS Systolic 2  MEWS Pulse 1  MEWS RR 0  MEWS LOC 0  MEWS Score 3  MEWS Score Color Yellow  Assess: if the MEWS score is Yellow or Red  Were vital signs accurate and taken at a resting state? Yes  Does the patient meet 2 or more of the SIRS criteria? No  MEWS guidelines implemented  Yes, yellow  Treat  MEWS Interventions Considered administering scheduled or prn medications/treatments as ordered  Take Vital Signs  Increase Vital Sign Frequency  Yellow: Q2hr x1, continue Q4hrs until patient remains green for 12hrs  Escalate  MEWS: Escalate Yellow: Discuss with charge nurse and consider notifying provider and/or RRT  Notify: Charge Nurse/RN  Name of Charge Nurse/RN Notified Arland, RN  Provider Notification  Provider Name/Title Lawence Heinrich MD  Date Provider Notified 03/14/24  Time Provider Notified 2028  Method of Notification Page (Secure chat)  Notification Reason Change in status  Provider response See new orders  Date of Provider Response 03/14/24  Time of Provider Response 2036  Assess: SIRS CRITERIA  SIRS Temperature  0  SIRS Respirations  0  SIRS Pulse 1  SIRS WBC 0  SIRS Score Sum  1   Gave 2 250 ml boluses.  1x dose of midodrine .

## 2024-03-15 NOTE — Consult Note (Signed)
 PHARMACY - TOTAL PARENTERAL NUTRITION CONSULT NOTE   Indication: Small bowel obstruction  Patient Measurements: Height: 5' 4 (162.6 cm) Weight: 97.1 kg (214 lb 1.1 oz) IBW/kg (Calculated) : 54.7 TPN AdjBW (KG): 62.3 Body mass index is 36.74 kg/m. Usual Weight: N/A  Assessment:  58 y.o. 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 Patient discharged yesterday with plans to follow up with oncology, with new chemotherapy regimen set to initiate 7/22. Patient now back today after weakness with fall at home, now with AKI.  Update 7/22: AKI continues to worsen. Nephrology consulted. Update 7/24: Hypotension overnight requiring midodrine . CCM consulted due to need for possible pressor support. GOC discussion between patient and family to take place.  Glucose / Insulin : no history of DM Insulin  requirements the last 24h: 7 > 12 > 3 > 14 > 11 units Electrolytes:  K 5.0>3.5>4.1>2.8>4.0 Mg 1.8 Renal: Scr 2.33>2.46>2.49>2.61>2.49>3.02 Baseline 0.6-0.8 Phos 3.7 (7/17) > 5.0 > 5.8 (7/21) > 5.8 (7/24) Hepatic: AST slightly elevated at 43 > 50 TG 356 (7/14) > 184 > 202 (7/21) Intake / Output: History of heart failure Sodium bicarb 150 mEq in D5W @ 100 cc/hr for acidosis  GI Imaging: US  renal 7/17: Mild left hydronephrosis, unchanged from 7/9  CT abdomen/pelvis 7/11:  1. Interval marked fluid distension of the stomach with irregular wall thickening of the antral pyloric region with mucosal enhancement with otherwise decompressed appearance of the bowel, outlet obstruction or functional obstruction of the stomach cannot be excluded. NG decompression is recommended 2. Slight increased ascites within the abdomen and pelvis, now small-moderate volume. No free air. 3. New mild left-sided hydronephrosis and hydroureter, with more normal  appearing caliber of distal left ureter in the pelvis, anterior to the iliac vessels. No obstructing stone. Findings are consistent with distal ureteral obstruction. Decreased excretion of contrast from the left kidney on delayed views. 4. Nodular soft tissue thickening at the right posterior bladder, stable in the short interim but suspicious for neoplasm/metastatic disease. 5. Extensive abdominal, retroperitoneal and pelvic adenopathy consistent with metastatic disease. 6. 6 mm right lower lobe pulmonary nodule, stable, and also concerning for metastatic disease. 7. Aortic atherosclerosis.  GI Surgeries / Procedures:  EGD done 7/10 7/14: PERCUTANEOUS (VENTING) G-TUBE PLACEMENT (3600 ml of fluid removed)  Central access: 7/11 TPN start date: 7/11  Nutritional Goals: Goal TPN rate 60 mL/hr (provides 102 g of protein and 1906 kcals per day)  RD Assessment: Consulted. Per discussion with RD, patient is third spacing and we want to be volume-sparing when possible with additional fluids. Total Energy Estimated Needs: 2000-2300kcal/day Total Protein Estimated Needs: 100-115g/day Total Fluid Estimated Needs: 1.4-1.6L/day  Current Nutrition: NPO Pt has tube but only for venting stomach, not for med administration  Plan:  Continue TPN rate at 60 mL/hr  Electrolytes in TPN: Na 45 mEq/L, K 40 mEq/L (increased 7/23), Ca 6mEq/L, Mg 16mEq/L, and Phos 5 mmol/L (decreased 7/21) Max acetate to help with worsening acidosis. Add standard MVI and trace elements to TPN. Continue Resistant q6h SSI and insulin  10 units in TPN. Monitor TPN labs on Mon/Thurs   Will M. Lenon, PharmD Clinical Pharmacist 03/15/2024 11:39 AM

## 2024-03-15 NOTE — Plan of Care (Addendum)
 Patient is alert and oriented X 4,  Looks lethargic and SOB. Her blood pressure has been running low, dr Marsa and Charge nurse Daphne made aware. No any new order received. Denise any pain.  Problem: Education: Goal: Knowledge of General Education information will improve Description: Including pain rating scale, medication(s)/side effects and non-pharmacologic comfort measures Outcome: Progressing   Problem: Health Behavior/Discharge Planning: Goal: Ability to manage health-related needs will improve Outcome: Progressing   Problem: Clinical Measurements: Goal: Ability to maintain clinical measurements within normal limits will improve Outcome: Progressing Goal: Will remain free from infection Outcome: Progressing Goal: Diagnostic test results will improve Outcome: Progressing Goal: Respiratory complications will improve Outcome: Progressing Goal: Cardiovascular complication will be avoided Outcome: Progressing   Problem: Activity: Goal: Risk for activity intolerance will decrease Outcome: Progressing   Problem: Nutrition: Goal: Adequate nutrition will be maintained Outcome: Progressing   Problem: Pain Managment: Goal: General experience of comfort will improve and/or be controlled Outcome: Progressing   Problem: Safety: Goal: Ability to remain free from injury will improve Outcome: Progressing   Problem: Skin Integrity: Goal: Risk for impaired skin integrity will decrease Outcome: Progressing

## 2024-03-15 NOTE — Consult Note (Addendum)
 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)   09/26/2023 - 01/27/2024 Chemotherapy   Patient is on Treatment Plan : neuronedocrine endometrium Carboplatin  + Etoposide      03/12/2024 -  Chemotherapy   Patient is on Treatment Plan : OVARIAN Topotecan (1.25) D1-5 q21d     Endometrial carcinoma (HCC)  09/21/2023 Initial Diagnosis   Endometrial carcinoma (HCC)   09/21/2023 Cancer Staging   Staging form: Corpus Uteri - Carcinoma and Carcinosarcoma, AJCC 8th Edition and FIGO 2023 - Clinical stage from 09/21/2023: FIGO Stage IVC, calculated as Stage IVB (cN2a, pM1) - Signed by Melanee Annah BROCKS, MD on 09/21/2023 Histologic grade (G): G3 Histologic grading system: 3 grade system     HISTORY OF PRESENTING ILLNESS:. Alone.  Maddeline Roorda 58 y.o.  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  Patient was recently admitted to hospital for malignant gastric obstruction status post PEG tube.  However patient noted to have progressive weakness dehydration-and subsequently admitted to the hospital.  CT scan on the admission noted to have-progressive moderate abdominal ascites-peritoneal disease and also moderate bilateral pleural effusion.  And also implants posterior to the bladder wall concerning for primary bladder malignancy versus  serosal implants.  At this hospital admission patient was evaluated by critical care given for worsening respiratory status.  She is also been evaluated by palliative care.   Oncology has been consulted for further evaluation recommendations for goals of care.  Please  Review of Systems  Constitutional:  Positive for malaise/fatigue and weight loss. Negative for chills, diaphoresis and fever.  HENT:  Negative for nosebleeds and sore throat.   Eyes:  Negative for double vision.  Respiratory:  Positive for shortness of breath. Negative for cough, hemoptysis, sputum production and wheezing.   Cardiovascular:  Negative for chest pain, palpitations, orthopnea and leg swelling.  Gastrointestinal:  Positive for abdominal pain and nausea. Negative for blood in stool, constipation, diarrhea, heartburn, melena and vomiting.  Genitourinary:  Negative for dysuria, frequency and urgency.  Musculoskeletal:  Positive for back pain and joint pain.  Skin: Negative.  Negative for itching and rash.  Neurological:  Negative for dizziness, tingling, focal weakness, weakness and headaches.  Endo/Heme/Allergies:  Does not bruise/bleed easily.  Psychiatric/Behavioral:  Negative for depression. The patient is not nervous/anxious and does not have insomnia.     MEDICAL HISTORY:  Past Medical History:  Diagnosis Date   Cancer (HCC)    Coronary artery disease    GERD (gastroesophageal reflux disease)    Hypertension    Hypothyroidism    Myocardial infarction (HCC)    Thyroid  disease     SURGICAL HISTORY: Past Surgical History:  Procedure Laterality Date   CARDIAC CATHETERIZATION     CESAREAN SECTION     CORONARY STENT INTERVENTION N/A 07/24/2019   Procedure: CORONARY STENT INTERVENTION;  Surgeon: Mady Lonni, MD;  Location: [facility] INVASIVE CV LAB;  Service: Cardiovascular;  Laterality: N/A;  RCA & CFX   ESOPHAGOGASTRODUODENOSCOPY  N/A 03/01/2024   Procedure: EGD (ESOPHAGOGASTRODUODENOSCOPY);  Surgeon:  Jinny Carmine, MD;  Location: [facility] ENDOSCOPY;  Service: Endoscopy;  Laterality: N/A;   IR GASTROSTOMY TUBE MOD SED  03/05/2024   IR IMAGING GUIDED PORT INSERTION  09/23/2023   IR PARACENTESIS  03/05/2024   LEFT HEART CATH AND CORONARY ANGIOGRAPHY N/A 07/24/2019   Procedure: LEFT HEART CATH AND CORONARY ANGIOGRAPHY;  Surgeon: Hester Wolm PARAS, MD;  Location: [facility] INVASIVE CV LAB;  Service: Cardiovascular;  Laterality: N/A;   TONSILLECTOMY      SOCIAL HISTORY: Social History   Socioeconomic History   Marital status: Married    Spouse name: Merina Behrendt   Number of children: 1   Years of education: Not on file   Highest education level: Not on file  Occupational History   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[employer])  Tobacco Use   Smoking status: Never   Smokeless tobacco: Never  Vaping Use   Vaping status: Never Used  Substance and Sexual Activity   Alcohol use: Not Currently    Comment: occ   Drug use: Never   Sexual activity: Not Currently  Other Topics Concern   Not on file  Social History Narrative   Not on file   Social Drivers of Health   Financial Resource Strain: Low Risk  (07/23/2019)   Overall Financial Resource Strain (CARDIA)    Difficulty of Paying Living Expenses: Not hard at all  Food Insecurity: No Food Insecurity (03/09/2024)   Hunger Vital Sign    Worried About Running Out of Food in the Last Year: Never true    Ran Out of Food in the Last Year: Never true  Transportation Needs: No Transportation Needs (03/09/2024)   PRAPARE - Administrator, Civil Service (Medical): No    Lack of Transportation (Non-Medical): No  Physical Activity: Unknown (07/23/2019)   Exercise Vital Sign    Days of Exercise per Week: 2 days    Minutes of Exercise per Session: Not on file  Stress: No Stress Concern Present (07/23/2019)   [employer] of Occupational Health - Occupational Stress Questionnaire    Feeling of Stress : Not at all  Social Connections: Moderately  Integrated (03/09/2024)   Social Connection and Isolation Panel    Frequency of Communication with Friends and Family: More than three times a week    Frequency of Social Gatherings with Friends and Family: Twice a week    Attends Religious Services: 1 to 4 times per year    Active Member of [employer] or Organizations: No    Attends Banker Meetings: Never    Marital Status: Married  Catering manager Violence: Not At Risk (03/09/2024)   Humiliation, Afraid, Rape, and Kick questionnaire    Fear of Current or Ex-Partner: No    Emotionally Abused: No    Physically Abused: No    Sexually Abused: No    FAMILY HISTORY: Family History  Problem Relation Age of Onset   Atrial fibrillation Mother        Passed January 2024.   Colon cancer Mother        Diagnosed 2007   Heart Problems Father    Heart attack Father    Heart disease Father     ALLERGIES:  has no known allergies.  MEDICATIONS:  Current Facility-Administered Medications  Medication Dose Route Frequency Provider Last Rate Last Admin   acetaminophen  (TYLENOL ) tablet 650 mg  650 mg Per Tube Q6H PRN Paudel, Keshab, MD  Or   acetaminophen  (TYLENOL ) suppository 650 mg  650 mg Rectal Q6H PRN Roann Gouty, MD       bisacodyl  (DULCOLAX) suppository 10 mg  10 mg Rectal Daily PRN Paudel, Keshab, MD   10 mg at 03/11/24 1112   Chlorhexidine  Gluconate Cloth 2 % PADS 6 each  6 each Topical Daily Paudel, Keshab, MD   6 each at 03/15/24 1104   enoxaparin  (LOVENOX ) injection 30 mg  30 mg Subcutaneous Q24H Paudel, Gouty, MD   30 mg at 03/14/24 2305   insulin  aspart (novoLOG ) injection 0-20 Units  0-20 Units Subcutaneous Q6H Lenon Elsie HERO, [facility]   3 Units at 03/15/24 1215   levothyroxine  (SYNTHROID , LEVOTHROID) injection 200 mcg  200 mcg Intravenous q AM Paudel, Keshab, MD   200 mcg at 03/15/24 9392   methocarbamol  (ROBAXIN ) injection 500 mg  500 mg Intravenous Q6H PRN Arvid Collar, FNP   500 mg at 03/14/24 2023    ondansetron  (ZOFRAN ) tablet 4 mg  4 mg Per Tube Q6H PRN Roann Gouty, MD       Or   ondansetron  (ZOFRAN ) injection 4 mg  4 mg Intravenous Q6H PRN Paudel, Keshab, MD       pantoprazole  (PROTONIX ) injection 40 mg  40 mg Intravenous Daily Paudel, Gouty, MD   40 mg at 03/15/24 0818   simethicone  (MYLICON) 40 MG/0.6ML suspension 40 mg  40 mg Per Tube BID Dorinda Homans T, MD   40 mg at 03/15/24 0820   TPN ADULT (ION)   Intravenous Continuous TPN Lenon Elsie HERO, RPH 60 mL/hr at 03/15/24 1802 New Bag at 03/15/24 1802    PHYSICAL EXAMINATION:   Vitals:   03/15/24 1326 03/15/24 1710  BP: (!) 91/55 (!) 82/53  Pulse: (!) 107 (!) 101  Resp: (!) 28 (!) 28  Temp: 97.7 F (36.5 C)   SpO2: 97% 98%   Filed Weights   03/13/24 0500 03/14/24 0500 03/15/24 0546  Weight: 203 lb 11.3 oz (92.4 kg) 214 lb 11.7 oz (97.4 kg) 214 lb 1.1 oz (97.1 kg)  Obese female patient.  PEG tube in place.  Physical Exam Vitals and nursing note reviewed.  HENT:     Head: Normocephalic and atraumatic.     Mouth/Throat:     Pharynx: Oropharynx is clear.  Eyes:     Extraocular Movements: Extraocular movements intact.     Pupils: Pupils are equal, round, and reactive to light.  Cardiovascular:     Rate and Rhythm: Normal rate and regular rhythm.  Pulmonary:     Comments: Decreased breath sounds bilaterally.  Abdominal:     Palpations: Abdomen is soft.  Musculoskeletal:        General: Normal range of motion.     Cervical back: Normal range of motion.  Skin:    General: Skin is warm.  Neurological:     General: No focal deficit present.     Mental Status: She is alert and oriented to person, place, and time.  Psychiatric:        Behavior: Behavior normal.        Judgment: Judgment normal.     LABORATORY DATA:  I have reviewed the data as listed Lab Results  Component Value Date   WBC 29.1 (H) 03/15/2024   HGB 9.8 (L) 03/15/2024   HCT 29.7 (L) 03/15/2024   MCV 98.7 03/15/2024   PLT 118 (L)  03/15/2024   Recent Labs    10/12/23 0514 10/13/23 0522 03/08/24 0520 03/09/24 1119  03/10/24 9487 03/11/24 9061 03/13/24 0441 03/14/24 0453 03/14/24 2029 03/15/24 0743  NA 134*   < > 137 134* 136   < > 139 135  --  135  K 3.3*   < > 4.9 5.6* 4.8   < > 4.1 2.8* 4.4 4.0  CL 102   < > 103 100 102   < > 107 105  --  101  CO2 21*   < > 24 15* 20*   < > 16* 13*  --  15*  GLUCOSE 100*   < > 77 264* 91   < > 114* 155*  --  156*  BUN 39*   < > 94* 117* 124*   < > 149* 150*  --  165*  CREATININE 0.91   < > 1.65* 2.33* 2.32*   < > 2.61* 2.49*  --  3.02*  CALCIUM  9.1   < > 9.0 8.8* 8.4*   < > 8.5* 8.3*  --  8.2*  GFRNONAA >60   < > 36* 24* 24*   < > 21* 22*  --  17*  PROT 5.4*   < > 5.7* 6.0* 5.5*  --   --   --   --   --   ALBUMIN  2.2*   < > 2.2* 2.3* 2.0*  --   --   --   --   --   AST 25   < > 43* 43* 50*  --   --   --   --   --   ALT 17   < > 8 8 10   --   --   --   --   --   ALKPHOS 172*   < > 78 90 90  --   --   --   --   --   BILITOT 1.0   < > 0.6 0.5 0.7  --   --   --   --   --   BILIDIR 0.2  --   --   --   --   --   --   --   --   --   IBILI 0.8  --   --   --   --   --   --   --   --   --    < > = values in this interval not displayed.    RADIOGRAPHIC STUDIES: I have personally reviewed the radiological images as listed and agreed with the findings in the report. DG Chest Port 1 View Result Date: 03/15/2024 CLINICAL DATA:  Shortness of breath, weakness. Metastatic endometrial cancer. EXAM: PORTABLE CHEST 1 VIEW COMPARISON:  03/07/2024 FINDINGS: Power injectable right Port-A-Cath tip: Right atrium. Coronary and aortic atherosclerosis. Retrocardiac airspace opacity mild blunting of the left lateral costophrenic angle. Appearance favors left lower lobe atelectasis or pneumonia potentially in the setting of a pleural effusion. Mild fullness of the left hilum, mild bilateral hilar fullness, vascular versus mild hilar adenopathy. Bandlike collection of barium in the stomach. IMPRESSION: 1.  Retrocardiac airspace opacity, favoring left lower lobe atelectasis or pneumonia potentially in the setting of a pleural effusion. 2. Mild fullness of the left hilum, mild bilateral hilar fullness, vascular versus mild hilar adenopathy. 3. Coronary and aortic atherosclerosis. 4. Bandlike collection of barium in the stomach. Electronically Signed   By: Ryan Salvage M.D.   On: 03/15/2024 08:42   CT ABDOMEN PELVIS WO CONTRAST Result Date: 03/12/2024 EXAM: CT ABDOMEN AND PELVIS WITHOUT  CONTRAST 03/12/2024 08:25:13 PM TECHNIQUE: CT of the abdomen and pelvis was performed without the administration of intravenous contrast. Multiplanar reformatted images are provided for review. Automated exposure control, iterative reconstruction, and/or weight based adjustment of the mA/kV was utilized to reduce the radiation dose to as low as reasonably achievable. COMPARISON: 02/29/2024 CLINICAL HISTORY: Abdominal pain, acute, nonlocalized. FINDINGS: LOWER CHEST: Moderate bilateral pleural effusions, left greater than right, new/increased. LIVER: Cirrhosis. GALLBLADDER AND BILE DUCTS: Vicarious excretion of contrast in the gallbladder. SPLEEN: No acute abnormality. PANCREAS: No acute abnormality. ADRENAL GLANDS: No acute abnormality. KIDNEYS, URETERS AND BLADDER: No stones in the kidneys or ureters. No hydronephrosis. No perinephric or periureteral stranding. Right posterolateral bladder wall thickening, suspicious for primary bladder carcinoma versus serosal metastases. GI AND BOWEL: Suspected normal appendix. Percutaneous gastrostomy and satisfactory position, new. Loculated ascites along the posterior gastric body measuring 5.0 x 8.0 cm, new. Mild free air beneath the anterior abdominal wall, likely post-procedural. PERITONEUM AND RETROPERITONEUM: Moderate abdominal pelvic ascites, mildly progressive. Mild omental nodularity beneath the left anterior abdominal wall, suggesting peritoneal disease. VASCULATURE: Aorta is  normal in caliber. LYMPH NODES: Upper abdominal and retroperitoneal lymphadenopathy, including a dominant 2.2 cm protocaval node. Associated bilateral pelvic lymphadenopathy, including a 2.3 cm short axis left external iliac node. REPRODUCTIVE ORGANS: Status post hysterectomy. BONES AND SOFT TISSUES: Body wall edema. IMPRESSION: 1. Interval percutaneous gastrostomy in satisfactory position. Mild free air, likely postprocedural. 2. Moderate abdominal pelvic ascites, mildly progressive. Associated mild omental nodularity beneath the left anterior abdominal wall, suggesting peritoneal disease. 3. Right posterolateral bladder wall thickening, suspicious for primary bladder carcinoma versus serosal metastases. 4. Stable abdominopelvic nodal metastases. 5. Moderate bilateral pleural effusions, left greater than right, new/increased. 6. Additional ancillary findings as above. Electronically signed by: Pinkie Pebbles MD 03/12/2024 08:35 PM EDT RP Workstation: [id number]   US  RENAL Result Date: 03/08/2024 CLINICAL DATA:  Acute kidney injury EXAM: RENAL / URINARY TRACT ULTRASOUND COMPLETE COMPARISON:  CT abdomen and pelvis dated 02/29/2024 FINDINGS: Right Kidney: Length = 11.9 cm AP renal pelvis diameter = <10 mm Normal parenchymal echogenicity with preserved corticomedullary differentiation. No urinary tract dilation or shadowing calculi. The ureter is not seen. Left Kidney: Length = 12.5 cm AP renal pelvis diameter = <10 mm Normal parenchymal echogenicity with preserved corticomedullary differentiation. Mild urinary tract dilation, similar to 02/29/2024. The ureter is not seen. Bladder: Irregular soft tissue density along the posterior right bladder measures 1.6 x 1.4 x 0.9 cm, previously 3.5 x 1.6 cm. Other: None. IMPRESSION: 1. Similar mild left hydronephrosis. 2. Decreased size of irregular soft tissue density along the posterior right bladder. Electronically Signed   By: Limin  Xu M.D.   On: 03/08/2024 16:10   DG  Chest 2 View Result Date: 03/07/2024 CLINICAL DATA:  Leukocytosis EXAM: CHEST - 2 VIEW COMPARISON:  11/04/2023 FINDINGS: Right Port-A-Cath remains in place, unchanged. Heart and mediastinal contours are within normal limits. No focal opacities or effusions. No acute bony abnormality. IMPRESSION: No active cardiopulmonary disease. Electronically Signed   By: Franky Crease M.D.   On: 03/07/2024 11:21   IR GASTROSTOMY TUBE MOD SED Result Date: 03/05/2024 INDICATION: Venting gastrostomy. History of metastatic endometrial cancer with gastric outlet obstruction. EXAM: Procedures; 1. PERCUTANEOUS GASTROSTOMY TUBE PLACEMENT 2. ULTRASOUND-GUIDED DIAGNOSTIC AND THERAPEUTIC PARACENTESIS COMPARISON:  CT AP, 02/29/2024. MEDICATIONS: Ancef  2 gm IV; Antibiotics were administered within 1 hour of the procedure. CONTRAST:  20 mL mL of Isovue 300 administered into the gastric lumen. ANESTHESIA/SEDATION: Moderate (conscious) sedation was employed  during this procedure. A total of Versed  2 mg and Fentanyl  100 mcg was administered intravenously. Moderate Sedation Time: 22 minutes. The patient's level of consciousness and vital signs were monitored continuously by radiology nursing throughout the procedure under my direct supervision. FLUOROSCOPY TIME:  Radiation Exposure Index and estimated peak skin dose (PSD); Reference air kerma (RAK), 8.1 mGy. COMPLICATIONS: None immediate. PROCEDURE: Informed written consent was obtained from the patient and/or patient's representative following explanation of the procedure, risks, benefits and alternatives. A time out was performed prior to the initiation of the procedure. Maximal barrier sterile technique utilized including caps, mask, sterile gowns, sterile gloves, large sterile drape, hand hygiene and sterile prep. Initial ultrasound scanning demonstrates a moderate-to-large amount of ascites within the LEFT lower abdomen which was subsequently prepped and draped in the usual sterile  fashion. 1% lidocaine  with epinephrine  was used for local anesthesia. An ultrasound image was saved for documentation purposed. An 8 Fr Safe-T-Centesis catheter was introduced. Paracentesis was performed. Attention was then directed to gastrostomy tube placement. The LEFT costal margin and barium opacified transverse colon were identified and avoided. Air was injected into the stomach for insufflation and visualization under fluoroscopy. Under sterile conditions and local anesthesia, 2 T tacks were utilized to pexy the anterior aspect of the stomach against the ventral abdominal wall. Contrast injection confirmed appropriate positioning of each of the T tacks. An incision was made between the T tacks and a 17 gauge trocar needle was utilized to access the stomach. Needle position was confirmed within the stomach with aspiration of air and injection of a small amount of contrast. A stiff guidewire was advanced into the gastric lumen and under intermittent fluoroscopic guidance, the access needle was exchanged for a telescoping peel-away sheath, ultimately allowing placement of a 18 Fr balloon retention gastrostomy tube. The retention balloon was insufflated with a mixture of dilute saline and contrast and pulled taut against the anterior wall of the stomach. The external disc was cinched. Contrast injection confirms positioning within the stomach. Several spot radiographic images were obtained in various obliquities for documentation. The paracentesis catheter was removed and a dressing was applied. The patient tolerated the procedures well without immediate post procedural complication. The patient tolerated procedure well without immediate post procedural complication. FINDINGS: *After successful fluoroscopic guided placement, the gastrostomy tube is appropriately positioned with internal retention balloon against the ventral aspect of the gastric lumen. *A total of approximately 3.6 L of serous peritoneal fluid  was removed. Samples were sent to the laboratory as requested by the clinical team. IMPRESSION: 1. Successful fluoroscopic insertion of a 18 Fr balloon retention gastrostomy tube for venting. The gastrostomy may be used immediately for medication administration and in 4 hrs for the initiation of feeds. 2. Successful ultrasound-guided diagnostic and therapeutic paracentesis yielding 3.6 L of peritoneal fluid. RECOMMENDATIONS: The patient will return to [facility] ([facility]) for routine feeding tube evaluation and exchange in 6 months. Thom Hall, MD [facility] Electronically Signed   By: Thom Hall M.D.   On: 03/05/2024 16:28   IR Paracentesis Result Date: 03/05/2024 INDICATION: Venting gastrostomy. History of metastatic endometrial cancer with gastric outlet obstruction. EXAM: Procedures; 1. PERCUTANEOUS GASTROSTOMY TUBE PLACEMENT 2. ULTRASOUND-GUIDED DIAGNOSTIC AND THERAPEUTIC PARACENTESIS COMPARISON:  CT AP, 02/29/2024. MEDICATIONS: Ancef  2 gm IV; Antibiotics were administered within 1 hour of the procedure. CONTRAST:  20 mL mL of Isovue 300 administered into the gastric lumen. ANESTHESIA/SEDATION: Moderate (conscious) sedation was employed during this procedure.  A total of Versed  2 mg and Fentanyl  100 mcg was administered intravenously. Moderate Sedation Time: 22 minutes. The patient's level of consciousness and vital signs were monitored continuously by radiology nursing throughout the procedure under my direct supervision. FLUOROSCOPY TIME:  Radiation Exposure Index and estimated peak skin dose (PSD); Reference air kerma (RAK), 8.1 mGy. COMPLICATIONS: None immediate. PROCEDURE: Informed written consent was obtained from the patient and/or patient's representative following explanation of the procedure, risks, benefits and alternatives. A time out was performed prior to the initiation of the procedure. Maximal barrier sterile  technique utilized including caps, mask, sterile gowns, sterile gloves, large sterile drape, hand hygiene and sterile prep. Initial ultrasound scanning demonstrates a moderate-to-large amount of ascites within the LEFT lower abdomen which was subsequently prepped and draped in the usual sterile fashion. 1% lidocaine  with epinephrine  was used for local anesthesia. An ultrasound image was saved for documentation purposed. An 8 Fr Safe-T-Centesis catheter was introduced. Paracentesis was performed. Attention was then directed to gastrostomy tube placement. The LEFT costal margin and barium opacified transverse colon were identified and avoided. Air was injected into the stomach for insufflation and visualization under fluoroscopy. Under sterile conditions and local anesthesia, 2 T tacks were utilized to pexy the anterior aspect of the stomach against the ventral abdominal wall. Contrast injection confirmed appropriate positioning of each of the T tacks. An incision was made between the T tacks and a 17 gauge trocar needle was utilized to access the stomach. Needle position was confirmed within the stomach with aspiration of air and injection of a small amount of contrast. A stiff guidewire was advanced into the gastric lumen and under intermittent fluoroscopic guidance, the access needle was exchanged for a telescoping peel-away sheath, ultimately allowing placement of a 18 Fr balloon retention gastrostomy tube. The retention balloon was insufflated with a mixture of dilute saline and contrast and pulled taut against the anterior wall of the stomach. The external disc was cinched. Contrast injection confirms positioning within the stomach. Several spot radiographic images were obtained in various obliquities for documentation. The paracentesis catheter was removed and a dressing was applied. The patient tolerated the procedures well without immediate post procedural complication. The patient tolerated procedure well  without immediate post procedural complication. FINDINGS: *After successful fluoroscopic guided placement, the gastrostomy tube is appropriately positioned with internal retention balloon against the ventral aspect of the gastric lumen. *A total of approximately 3.6 L of serous peritoneal fluid was removed. Samples were sent to the laboratory as requested by the clinical team. IMPRESSION: 1. Successful fluoroscopic insertion of a 18 Fr balloon retention gastrostomy tube for venting. The gastrostomy may be used immediately for medication administration and in 4 hrs for the initiation of feeds. 2. Successful ultrasound-guided diagnostic and therapeutic paracentesis yielding 3.6 L of peritoneal fluid. RECOMMENDATIONS: The patient will return to Vascular Interventional Radiology (VIR) for routine feeding tube evaluation and exchange in 6 months. Thom Hall, MD [facility] Radiology Electronically Signed   By: Thom Hall M.D.   On: 03/05/2024 16:28   ECHOCARDIOGRAM COMPLETE Result Date: 03/04/2024    ECHOCARDIOGRAM REPORT   Patient Name:   NATAYLA CADENHEAD Date of Exam: 03/03/2024 Medical Rec #:  [id number]    Height:       64.0 in Accession #:    [id number]   Weight:       180.8 lb Date of Birth:  [birth year]    BSA:  1.874 m Patient Age:    58 years     BP:           139/91 mmHg Patient Gender: F            HR:           89 bpm. Exam Location:  [facility] Procedure: 2D Echo, Cardiac Doppler, Color Doppler and Intracardiac            Opacification Agent (Both Spectral and Color Flow Doppler were            utilized during procedure). Indications:     Abnormal ECG R94.31  History:         Patient has prior history of Echocardiogram examinations, most                  recent 07/24/2019.  Sonographer:     Thedora Louder RDCS, FASE Referring Phys:  [id number] SUMAYYA AMIN Diagnosing Phys: Redell Cave MD  Sonographer Comments: Technically difficult study due to poor echo  windows and suboptimal apical window. Image acquisition challenging due to patient body habitus. IMPRESSIONS  1. Left ventricular ejection fraction, by estimation, is 60 to 65%. Left ventricular ejection fraction by 2D MOD biplane is 63.1 %. The left ventricle has normal function. The left ventricle has no regional wall motion abnormalities. Left ventricular diastolic parameters are consistent with Grade I diastolic dysfunction (impaired relaxation).  2. Right ventricular systolic function is normal. The right ventricular size is normal.  3. A small pericardial effusion is present.  4. The mitral valve is normal in structure. No evidence of mitral valve regurgitation.  5. The aortic valve is tricuspid. Aortic valve regurgitation is not visualized. Aortic valve sclerosis is present, with no evidence of aortic valve stenosis.  6. The inferior vena cava is normal in size with greater than 50% respiratory variability, suggesting right atrial pressure of 3 mmHg. FINDINGS  Left Ventricle: Left ventricular ejection fraction, by estimation, is 60 to 65%. Left ventricular ejection fraction by 2D MOD biplane is 63.1 %. The left ventricle has normal function. The left ventricle has no regional wall motion abnormalities. Definity  contrast agent was given IV to delineate the left ventricular endocardial borders. The left ventricular internal cavity size was normal in size. There is no left ventricular hypertrophy. Left ventricular diastolic parameters are consistent with Grade I diastolic dysfunction (impaired relaxation). Right Ventricle: The right ventricular size is normal. No increase in right ventricular wall thickness. Right ventricular systolic function is normal. Left Atrium: Left atrial size was normal in size. Right Atrium: Right atrial size was normal in size. Pericardium: A small pericardial effusion is present. Mitral Valve: The mitral valve is normal in structure. No evidence of mitral valve regurgitation.  Tricuspid Valve: The tricuspid valve is normal in structure. Tricuspid valve regurgitation is not demonstrated. Aortic Valve: The aortic valve is tricuspid. Aortic valve regurgitation is not visualized. Aortic valve sclerosis is present, with no evidence of aortic valve stenosis. Aortic valve peak gradient measures 9.9 mmHg. Pulmonic Valve: The pulmonic valve was normal in structure. Pulmonic valve regurgitation is not visualized. Aorta: The aortic root is normal in size and structure. Venous: The inferior vena cava is normal in size with greater than 50% respiratory variability, suggesting right atrial pressure of 3 mmHg. IAS/Shunts: No atrial level shunt detected by color flow Doppler.  LEFT VENTRICLE PLAX 2D  Biplane EF (MOD) LVIDd:         4.90 cm         LV Biplane EF:   Left LVIDs:         3.20 cm                          ventricular LV PW:         1.30 cm                          ejection LV IVS:        1.00 cm                          fraction by LVOT diam:     2.00 cm                          2D MOD LV SV:         53                               biplane is LV SV Index:   28                               63.1 %. LVOT Area:     3.14 cm                                Diastology                                LV e' medial:    6.31 cm/s LV Volumes (MOD)               LV E/e' medial:  8.1 LV vol d, MOD    47.7 ml       LV e' lateral:   11.40 cm/s A2C:                           LV E/e' lateral: 4.5 LV vol d, MOD    60.3 ml A4C: LV vol s, MOD    18.7 ml A2C: LV vol s, MOD    18.9 ml A4C: LV SV MOD A2C:   29.0 ml LV SV MOD A4C:   60.3 ml LV SV MOD BP:    33.7 ml RIGHT VENTRICLE RV Basal diam:  2.60 cm RV S prime:     12.80 cm/s TAPSE (M-mode): 1.6 cm LEFT ATRIUM           Index       RIGHT ATRIUM          Index LA diam:      2.90 cm 1.55 cm/m  RA Area:     7.58 cm LA Vol (A4C): 14.8 ml 7.90 ml/m  RA Volume:   11.80 ml 6.30 ml/m  AORTIC VALVE                 PULMONIC VALVE AV Area (Vmax):  1.77 cm     PV Vmax:        1.38 m/s AV Vmax:  157.00 cm/s  PV Peak grad:   7.6 mmHg AV Peak Grad:   9.9 mmHg     RVOT Peak grad: 7 mmHg LVOT Vmax:      88.40 cm/s LVOT Vmean:     59.900 cm/s LVOT VTI:       0.169 m  AORTA Ao Root diam: 3.30 cm Ao Asc diam:  2.90 cm MITRAL VALVE MV Area (PHT): 5.13 cm    SHUNTS MV Decel Time: 148 msec    Systemic VTI:  0.17 m MV E velocity: 51.00 cm/s  Systemic Diam: 2.00 cm MV A velocity: 69.20 cm/s MV E/A ratio:  0.74 Redell Cave MD Electronically signed by Redell Cave MD Signature Date/Time: 03/04/2024/12:14:06 PM    Final    US  EKG SITE RITE Result Date: 03/02/2024 If Site Rite image not attached, placement could not be confirmed due to current cardiac rhythm.  DG Abd Portable 1 View Result Date: 03/01/2024 CLINICAL DATA:  NG tube placement EXAM: PORTABLE ABDOMEN - 1 VIEW COMPARISON:  CT 02/29/2024 FINDINGS: NG tube tip is in the proximal to mid stomach. IMPRESSION: NG tube in the stomach. Electronically Signed   By: Franky Crease M.D.   On: 03/01/2024 01:42   CT ABDOMEN PELVIS W CONTRAST Result Date: 02/29/2024 CLINICAL DATA:  Nausea vomiting syncope EXAM: CT ABDOMEN AND PELVIS WITH CONTRAST TECHNIQUE: Multidetector CT imaging of the abdomen and pelvis was performed using the standard protocol following bolus administration of intravenous contrast. RADIATION DOSE REDUCTION: This exam was performed according to the departmental dose-optimization program which includes automated exposure control, adjustment of the mA and/or kV according to patient size and/or use of iterative reconstruction technique. CONTRAST:  OMNIPAQUE  IOHEXOL  300 MG/ML  SOLN COMPARISON:  CT 02/23/2024, 11/25/2023 FINDINGS: Lower chest: Lung bases demonstrate no acute airspace disease. 6 mm right lower lobe anterior pulmonary nodule series 4, image 8 again noted. Coronary vascular calcification. Hepatobiliary: Hyperdense appearance of gallbladder, possible sludge. No calcified  stone. No biliary dilatation Pancreas: Unremarkable. No pancreatic ductal dilatation or surrounding inflammatory changes. Spleen: Normal in size without focal abnormality. Adrenals/Urinary Tract: Stable adrenal glands. Mild left-sided hydronephrosis and hydroureter, slightly increased compared to prior, with more normal appearing caliber of distal left ureter in the pelvis, anterior to the iliac vessels. No obstructing stone. Decreased excretion of contrast on the left on delayed views consistent with obstruction. Nodular soft tissue thickening at the right posterior bladder, measuring about 35 x 16 mm on series 2, image 75, stable in the short interim. Stomach/Bowel: Interval marked fluid distension of the stomach. Irregular wall thickening of the antral pyloric region with mucosal enhancement. Decompressed duodenum. Slightly low lying ligament of Treitz probably displaced by gastric distension. Elsewhere no dilated bowel is seen. 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 Vascular/Lymphatic: Moderate aortic atherosclerosis. As seen on recent prior, multiple enlarged abdominal, retroperitoneal and pelvic lymph nodes. Gastrohepatic nodes measuring up to 14 mm on series 2, image 27. Portal caval node measuring 21 mm on series 2, image 28. Index left iliac node measuring 22 mm on series 2, image 66, previously 21 mm. Reproductive: Stable appearance of the uterus and adnexal structures. Some asymmetrical soft tissue thickening of the right vulvar and perineal soft tissues, series 2 image 84. Other: No free air. Generalized subcutaneous edema. Slight increased ascites within the abdomen and pelvis, now small-moderate volume. Peritoneal metastatic disease as evidenced by stranding and nodularity of the omentum. Musculoskeletal: No new lesion. High-grade compression deformity at L1 unchanged IMPRESSION: 1.  Interval marked fluid distension of the stomach with irregular wall thickening of the antral pyloric region with  mucosal enhancement with otherwise decompressed appearance of the bowel, outlet obstruction or functional obstruction of the stomach cannot be excluded. NG decompression is recommended 2. Slight increased ascites within the abdomen and pelvis, now small-moderate volume. No free air. 3. New mild left-sided hydronephrosis and hydroureter, with more normal appearing caliber of distal left ureter in the pelvis, anterior to the iliac vessels. No obstructing stone. Findings are consistent with distal ureteral obstruction. Decreased excretion of contrast from the left kidney on delayed views. 4. Nodular soft tissue thickening at the right posterior bladder, stable in the short interim but suspicious for neoplasm/metastatic disease. 5. Extensive abdominal, retroperitoneal and pelvic adenopathy consistent with metastatic disease. 6. 6 mm right lower lobe pulmonary nodule, stable, and also concerning for metastatic disease. 7. Aortic atherosclerosis. Aortic Atherosclerosis (ICD10-I70.0). Electronically Signed   By: Luke Bun M.D.   On: 02/29/2024 23:19   US  Venous Img Lower Unilateral Right Result Date: 02/29/2024 CLINICAL DATA:  Leg swelling for 1 day. Fell on Monday. History of uterine cancer. EXAM: Right LOWER EXTREMITY VENOUS DOPPLER ULTRASOUND TECHNIQUE: Gray-scale sonography with compression, as well as color and duplex ultrasound, were performed to evaluate the deep venous system(s) from the level of the common femoral vein through the popliteal and proximal calf veins. COMPARISON:  None Available. FINDINGS: VENOUS Normal compressibility of the common femoral, superficial femoral, and popliteal veins, as well as the visualized calf veins. Visualized portions of profunda femoral vein and great saphenous vein unremarkable. No filling defects to suggest DVT on grayscale or color Doppler imaging. Doppler waveforms show normal direction of venous flow, normal respiratory plasticity and response to augmentation.  Limited views of the contralateral common femoral vein are unremarkable. OTHER None. Limitations: none IMPRESSION: No evidence of acute deep venous thrombosis in the visualized lower extremity veins. Electronically Signed   By: Elsie Gravely M.D.   On: 02/29/2024 21:34   CT CHEST ABDOMEN PELVIS W CONTRAST Result Date: 02/24/2024 CLINICAL DATA:  Metastatic endometrial cancer, ongoing chemotherapy * Tracking Code: BO * EXAM: CT CHEST, ABDOMEN, AND PELVIS WITH CONTRAST TECHNIQUE: Multidetector CT imaging of the chest, abdomen and pelvis was performed following the standard protocol during bolus administration of intravenous contrast. RADIATION DOSE REDUCTION: This exam was performed according to the departmental dose-optimization program which includes automated exposure control, adjustment of the mA and/or kV according to patient size and/or use of iterative reconstruction technique. CONTRAST:  OMNIPAQUE  IOHEXOL  300 MG/ML  SOLN COMPARISON:  11/25/2023 FINDINGS: CT CHEST FINDINGS Cardiovascular: Right chest port catheter. Scattered aortic atherosclerosis. Normal heart size. Three-vessel coronary artery calcifications. No pericardial effusion. Mediastinum/Nodes: Newly enlarged left axillary and subpectoral lymph nodes measuring up to 2.1 x 1.6 cm (series 2, image 12). Thyroid  gland, trachea, and esophagus demonstrate no significant findings. Lungs/Pleura: Interval resolution of previously seen left pleural effusion. Multiple new and enlarged bilateral pulmonary nodules, for example a 0.6 cm nodule of the anterior right lower lobe, previously no greater than 0.2 cm (series 3, image 88), and a new nodule in the posterior left upper lobe measuring 0.7 cm (series 3, image 54). Musculoskeletal: No chest wall abnormality. No acute osseous findings. CT ABDOMEN PELVIS FINDINGS Hepatobiliary: No solid liver abnormality is seen. No gallstones, gallbladder wall thickening, or biliary dilatation. Pancreas: Unremarkable.  No pancreatic ductal dilatation or surrounding inflammatory changes. Spleen: Normal in size without significant abnormality. Adrenals/Urinary Tract: Adrenal glands are unremarkable. Kidneys  are normal, without renal calculi, solid lesion, or hydronephrosis. New, enhancing nodular soft tissue along the posterior right aspect of the bladder dome measuring approximately 3.3 x 1.3 cm (series 2, image 101). Stomach/Bowel: Mucosal thickening and hyperenhancement of the stomach (series 2, image 61). Appendix appears normal. No evidence of bowel wall thickening, distention, or inflammatory changes. Vascular/Lymphatic: Aortic atherosclerosis. Interval enlargement of numerous abdominal and pelvic lymph nodes, involving the gastrohepatic ligament, celiac, porta hepatis, portacaval, retroperitoneal, bilateral iliac, and pelvic sidewall stations. Many lymph nodes are matted, particularly in the retroperitoneum and iliac stations. Largest index left iliac nodes measure up to 3.1 x 2.1 cm, previously on measurable (series 2, image 71). Largest portacaval nodes measure up to 2.9 x 1.9 cm, previously 1.3 x 0.9 cm (series 2, image 57). Reproductive: Matted appearance of the uterus and ovaries, similar to prior examination (series 2, image 92). Other: No abdominal wall hernia or abnormality. Small volume ascites throughout the abdomen and pelvis, similar in volume to prior examination. Interval increase in very fine peritoneal and omental nodularity in the ventral abdomen (series 2, image 72). Musculoskeletal: No acute osseous findings. Unchanged high-grade wedge deformity of L1 (series 6, image 94). IMPRESSION: 1. Interval enlargement of numerous lymph nodes throughout the chest, abdomen, and pelvis, consistent with worsened nodal metastatic disease. 2. Multiple new and enlarged bilateral pulmonary nodules, consistent with pulmonary metastatic disease. 3. Small volume ascites throughout the abdomen pelvis, similar to prior  examination. Interval increase in fine peritoneal and omental nodularity in the ventral abdomen as well as new, enhancing nodular soft tissue along the posterior right aspect of the bladder dome consistent with worsened peritoneal metastatic disease. 4. Matted post treatment appearance of the uterus and ovaries, similar to prior examination. 5. Mucosal thickening and hyperenhancement of the stomach, suggestive of nonspecific infectious or inflammatory gastritis. 6. Interval resolution of previously seen left pleural effusion. 7. Coronary artery disease. Aortic Atherosclerosis (ICD10-I70.0). Electronically Signed   By: Marolyn JONETTA Jaksch M.D.   On: 02/24/2024 16:28    No problem-specific Assessment & Plan notes found for this encounter.  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  # High-grade neuroendocrine carcinoma s/p chemotherapy-most recent CT scan shows progressive disease in July 2025.  Patient is not a candidate for any further therapy given her significant decline in performance status and/organ dysfunction.  # Gastric outlet obstruction status post PEG tube placement  # Dehydration/acute renal failure electrolyte abnormalities-secondary to #1 and 2  # Recommendations/plan:  # I reviewed with the patient and reiterated that she is a poor candidate for any further systemic therapy given her significant decline performance status and also organ dysfunction and recent progressive disease.  She understands that benefits of chemotherapy are outweighed by the potential side effects and risk of death.  # I reiterated to the patient that she is appropriate for hospice.  Patient is currently DNR/DNI.  Patient states that she would reach out to her family regarding the recent changes in  her clinical situation.   # Thank you Dr. Marsa for allowing me to participate in the care of your pleasant patient. Please do not hesitate to contact me with questions or concerns in the interim.  Above plan of care was discussed with patient in detail.     Cindy JONELLE Joe, MD 03/15/2024 9:36 PM

## 2024-03-15 NOTE — Progress Notes (Signed)
 [facility]  ROUNDING NOTE   Subjective:   Chronically ill appearing today Reports weakness  TPN at 57ml/hr Sodium Bicarb infusion @100ml .hr  Creatinine 3.02 Potassium 4.0 S bicarb 15  Objective:  Vital signs in last 24 hours:  Temp:  [97.3 F (36.3 C)-98.4 F (36.9 C)] 97.4 F (36.3 C) (07/24 1200) Pulse Rate:  [96-109] 107 (07/24 1200) Resp:  [18-28] 26 (07/24 1200) BP: (71-122)/(52-64) 82/53 (07/24 1200) SpO2:  [78 %-100 %] 97 % (07/24 1200) Weight:  [97.1 kg] 97.1 kg (07/24 0546)  Weight change: -0.3 kg Filed Weights   03/13/24 0500 03/14/24 0500 03/15/24 0546  Weight: 92.4 kg 97.4 kg 97.1 kg    Intake/Output: I/O last 3 completed shifts: In: 2974.7 [I.V.:2974.7] Out: 3250 [Urine:1600; Drains:1650]   Intake/Output this shift:  No intake/output data recorded.  Physical Exam: General: NAD, ill appearing  Head: Normocephalic, atraumatic. Moist oral mucosal membranes  Eyes: Anicteric  Neck: Supple  Lungs:  Clear to auscultation, room air  Heart: Regular rate and rhythm  Abdomen:  Soft, nontender  Extremities:  Trace peripheral edema.  Neurologic: Awake, alert, conversant  Skin: Warm,dry, no rash       Basic Metabolic Panel: Recent Labs  Lab 03/10/24 0512 03/11/24 0938 03/12/24 0500 03/13/24 0441 03/14/24 0453 03/14/24 2029 03/15/24 0743  NA 136 134* 138 139 135  --  135  K 4.8 5.0 3.5 4.1 2.8* 4.4 4.0  CL 102 103 105 107 105  --  101  CO2 20* 18* 15* 16* 13*  --  15*  GLUCOSE 91 123* 148* 114* 155*  --  156*  BUN 124* 128* 111* 149* 150*  --  165*  CREATININE 2.32* 2.46* 2.49* 2.61* 2.49*  --  3.02*  CALCIUM  8.4* 8.3* 8.4* 8.5* 8.3*  --  8.2*  MG  --   --  1.9  --   --   --  1.8  PHOS 5.0*  --  5.8*  --  5.3*  --  5.8*    Liver Function Tests: Recent Labs  Lab 03/09/24 1119 03/10/24 0512  AST 43* 50*  ALT 8 10  ALKPHOS 90 90  BILITOT 0.5 0.7  PROT 6.0* 5.5*  ALBUMIN  2.3* 2.0*   No results for input(s): LIPASE,  AMYLASE in the last 168 hours. No results for input(s): AMMONIA in the last 168 hours.  CBC: Recent Labs  Lab 03/11/24 0938 03/12/24 0500 03/13/24 0441 03/14/24 0453 03/15/24 0839  WBC 23.4* 23.8* 24.6* 25.5* 29.1*  NEUTROABS 20.9* 21.6* 22.6* 23.7*  --   HGB 11.0* 10.4* 11.1* 10.4* 9.8*  HCT 32.6* 30.4* 33.8* 30.8* 29.7*  MCV 98.8 97.4 99.7 97.5 98.7  PLT 184 176 179 152 118*    Cardiac Enzymes: Recent Labs  Lab 03/09/24 1119  CKTOTAL 25*    BNP: Invalid input(s): POCBNP  CBG: Recent Labs  Lab 03/14/24 2008 03/15/24 0010 03/15/24 0547 03/15/24 0757 03/15/24 1137  GLUCAP 137* 159* 175* 169* 135*    Microbiology: Results for orders placed or performed during the hospital encounter of 11/04/23  Resp panel by RT-PCR (RSV, Flu A&B, Covid) Anterior Nasal Swab     Status: None   Collection Time: 11/04/23  4:17 PM   Specimen: Anterior Nasal Swab  Result Value Ref Range Status   SARS Coronavirus 2 by RT PCR NEGATIVE NEGATIVE Final    Comment: (NOTE) SARS-CoV-2 target nucleic acids are NOT DETECTED.  The SARS-CoV-2 RNA is generally detectable in upper respiratory specimens during  the acute phase of infection. The lowest concentration of SARS-CoV-2 viral copies this assay can detect is 138 copies/mL. A negative result does not preclude SARS-Cov-2 infection and should not be used as the sole basis for treatment or other patient management decisions. A negative result may occur with  improper specimen collection/handling, submission of specimen other than nasopharyngeal swab, presence of viral mutation(s) within the areas targeted by this assay, and inadequate number of viral copies(<138 copies/mL). A negative result must be combined with clinical observations, patient history, and epidemiological information. The expected result is Negative.  Fact Sheet for Patients:  BloggerCourse.com  Fact Sheet for Healthcare Providers:   SeriousBroker.it  This test is no t yet approved or cleared by the [place]  FDA and  has been authorized for detection and/or diagnosis of SARS-CoV-2 by FDA under an Emergency Use Authorization (EUA). This EUA will remain  in effect (meaning this test can be used) for the duration of the COVID-19 declaration under Section 564(b)(1) of the Act, 21 U.S.C.section 360bbb-3(b)(1), unless the authorization is terminated  or revoked sooner.       Influenza A by PCR NEGATIVE NEGATIVE Final   Influenza B by PCR NEGATIVE NEGATIVE Final    Comment: (NOTE) The Xpert Xpress SARS-CoV-2/FLU/RSV plus assay is intended as an aid in the diagnosis of influenza from Nasopharyngeal swab specimens and should not be used as a sole basis for treatment. Nasal washings and aspirates are unacceptable for Xpert Xpress SARS-CoV-2/FLU/RSV testing.  Fact Sheet for Patients: BloggerCourse.com  Fact Sheet for Healthcare Providers: SeriousBroker.it  This test is not yet approved or cleared by the [place]  FDA and has been authorized for detection and/or diagnosis of SARS-CoV-2 by FDA under an Emergency Use Authorization (EUA). This EUA will remain in effect (meaning this test can be used) for the duration of the COVID-19 declaration under Section 564(b)(1) of the Act, 21 U.S.C. section 360bbb-3(b)(1), unless the authorization is terminated or revoked.     Resp Syncytial Virus by PCR NEGATIVE NEGATIVE Final    Comment: (NOTE) Fact Sheet for Patients: BloggerCourse.com  Fact Sheet for Healthcare Providers: SeriousBroker.it  This test is not yet approved or cleared by the [place]  FDA and has been authorized for detection and/or diagnosis of SARS-CoV-2 by FDA under an Emergency Use Authorization (EUA). This EUA will remain in effect (meaning this test can be used) for  the duration of the COVID-19 declaration under Section 564(b)(1) of the Act, 21 U.S.C. section 360bbb-3(b)(1), unless the authorization is terminated or revoked.  Performed at [facility], [address] [place], [place] [address]   Culture, blood (Routine X 2) w Reflex to ID Panel     Status: None   Collection Time: 11/04/23  4:18 PM   Specimen: BLOOD  Result Value Ref Range Status   Specimen Description BLOOD PORTA CATH  Final   Special Requests   Final    BOTTLES DRAWN AEROBIC AND ANAEROBIC Blood Culture adequate volume   Culture   Final    NO GROWTH 5 DAYS Performed at [facility], [address] [place], [place] [address]    Report Status 11/09/2023 FINAL  Final  Culture, blood (Routine X 2) w Reflex to ID Panel     Status: Abnormal   Collection Time: 11/04/23  4:23 PM   Specimen: BLOOD  Result Value Ref Range Status   Specimen Description   Final    BLOOD BLOOD LEFT ARM Performed at [facility], [address] [place], [place] [address]    Special Requests   Final    BOTTLES DRAWN AEROBIC AND ANAEROBIC Blood Culture results may not be optimal due to an inadequate volume of blood received in culture bottles Performed at [facility], [address] [place], [place] [address]    Culture  Setup Time   Final    GRAM POSITIVE COCCI AEROBIC BOTTLE ONLY CRITICAL RESULT CALLED TO, READ BACK BY AND VERIFIED WITH: MADISON HUNT @ 11/05/23 2058 AB    Culture (A)  Final    STAPHYLOCOCCUS EPIDERMIDIS THE SIGNIFICANCE OF ISOLATING THIS ORGANISM FROM A SINGLE SET OF BLOOD CULTURES WHEN MULTIPLE SETS ARE DRAWN IS UNCERTAIN. PLEASE NOTIFY THE MICROBIOLOGY DEPARTMENT WITHIN ONE WEEK IF SPECIATION AND SENSITIVITIES ARE REQUIRED. Performed at [facility], [address], [place], [place] [address]    Report Status 11/07/2023 FINAL  Final  Blood Culture ID Panel (Reflexed)     Status: Abnormal   Collection Time: 11/04/23  4:23 PM   Result Value Ref Range Status   Enterococcus faecalis NOT DETECTED NOT DETECTED Final   Enterococcus Faecium NOT DETECTED NOT DETECTED Final   Listeria monocytogenes NOT DETECTED NOT DETECTED Final   Staphylococcus species DETECTED (A) NOT DETECTED Final    Comment: CRITICAL RESULT CALLED TO, READ BACK BY AND VERIFIED WITH: MADISON HUNT @ 11/05/23 2058 AB    Staphylococcus aureus (BCID) NOT DETECTED NOT DETECTED Final   Staphylococcus epidermidis DETECTED (A) NOT DETECTED Final    Comment: Methicillin (oxacillin) resistant coagulase negative staphylococcus. Possible blood culture contaminant (unless isolated from more than one blood culture draw or clinical case suggests pathogenicity). No antibiotic treatment is indicated for blood  culture contaminants. CRITICAL RESULT CALLED TO, READ BACK BY AND VERIFIED WITH: MADISON HUNT @ 11/05/23 2058 AB    Staphylococcus lugdunensis NOT DETECTED NOT DETECTED Final   Streptococcus species NOT DETECTED NOT DETECTED Final   Streptococcus agalactiae NOT DETECTED NOT DETECTED Final   Streptococcus pneumoniae NOT DETECTED NOT DETECTED Final   Streptococcus pyogenes NOT DETECTED NOT DETECTED Final   A.calcoaceticus-baumannii NOT DETECTED NOT DETECTED Final   Bacteroides fragilis NOT DETECTED NOT DETECTED Final   Enterobacterales NOT DETECTED NOT DETECTED Final   Enterobacter cloacae complex NOT DETECTED NOT DETECTED Final   Escherichia coli NOT DETECTED NOT DETECTED Final   Klebsiella aerogenes NOT DETECTED NOT DETECTED Final   Klebsiella oxytoca NOT DETECTED NOT DETECTED Final   Klebsiella pneumoniae NOT DETECTED NOT DETECTED Final   Proteus species NOT DETECTED NOT DETECTED Final   Salmonella species NOT DETECTED NOT DETECTED Final   Serratia marcescens NOT DETECTED NOT DETECTED Final   Haemophilus influenzae NOT DETECTED NOT DETECTED Final   Neisseria meningitidis NOT DETECTED NOT DETECTED Final   Pseudomonas aeruginosa NOT DETECTED NOT  DETECTED Final   Stenotrophomonas maltophilia NOT DETECTED NOT DETECTED Final   Candida albicans NOT DETECTED NOT DETECTED Final   Candida auris NOT DETECTED NOT DETECTED Final   Candida glabrata NOT DETECTED NOT DETECTED Final   Candida krusei NOT DETECTED NOT DETECTED Final   Candida parapsilosis NOT DETECTED NOT DETECTED Final   Candida tropicalis NOT DETECTED NOT DETECTED Final   Cryptococcus neoformans/gattii NOT DETECTED NOT DETECTED Final   Methicillin resistance mecA/C DETECTED (A) NOT DETECTED Final    Comment: CRITICAL RESULT CALLED TO, READ BACK BY AND VERIFIED WITH: MADISON HUNT @ 11/05/23 2058 AB Performed at [facility], [address], [place], [place] [address]  Coagulation Studies: No results for input(s): LABPROT, INR in the last 72 hours.  Urinalysis: No results for input(s): COLORURINE, LABSPEC, PHURINE, GLUCOSEU, HGBUR, BILIRUBINUR, KETONESUR, PROTEINUR, UROBILINOGEN, NITRITE, LEUKOCYTESUR in the last 72 hours.  Invalid input(s): APPERANCEUR    Imaging: DG Chest Port 1 View Result Date: 03/15/2024 CLINICAL DATA:  Shortness of breath, weakness. Metastatic endometrial cancer. EXAM: PORTABLE CHEST 1 VIEW COMPARISON:  03/07/2024 FINDINGS: Power injectable right Port-A-Cath tip: Right atrium. Coronary and aortic atherosclerosis. Retrocardiac airspace opacity mild blunting of the left lateral costophrenic angle. Appearance favors left lower lobe atelectasis or pneumonia potentially in the setting of a pleural effusion. Mild fullness of the left hilum, mild bilateral hilar fullness, vascular versus mild hilar adenopathy. Bandlike collection of barium in the stomach. IMPRESSION: 1. Retrocardiac airspace opacity, favoring left lower lobe atelectasis or pneumonia potentially in the setting of a pleural effusion. 2. Mild fullness of the left hilum, mild bilateral hilar fullness, vascular versus mild hilar adenopathy. 3. Coronary and aortic  atherosclerosis. 4. Bandlike collection of barium in the stomach. Electronically Signed   By: Ryan Salvage M.D.   On: 03/15/2024 08:42     Medications:    sodium bicarbonate  150 mEq in dextrose  5 % 1,150 mL infusion Stopped (03/15/24 1055)   TPN ADULT (ION) 60 mL/hr at 03/15/24 0310   TPN ADULT (ION)      Chlorhexidine  Gluconate Cloth  6 each Topical Daily   enoxaparin  (LOVENOX ) injection  30 mg Subcutaneous Q24H   insulin  aspart  0-20 Units Subcutaneous Q6H   levothyroxine   200 mcg Intravenous q AM   pantoprazole   40 mg Intravenous Daily   simethicone   40 mg Per Tube BID   acetaminophen  **OR** acetaminophen , bisacodyl , methocarbamol  (ROBAXIN ) injection, ondansetron  **OR** ondansetron  (ZOFRAN ) IV  Assessment/ Plan:  [name] is a 58 y.o.  female with past medical history of CAD, gastric reflux, hypertension, gastric outlet obstruction status post PEG tube, stage IV metastatic endometrial cancer, who was admitted to [facility] on 03/09/2024 for Dehydration [E86.0]   Acute kidney injury with hyperkalemia appears to be multifactorial. Admitted with dehydration due to persistent GI losses. Also experiencing hypotension below baseline. Baseline creatinine 0.72 on 02/22/2024. Renal ultrasound completed on 7/17 shows mild left hydronephrosis as seen on previous scan. CT abdomen pelvis shows questionable bladder cancer with metastasis. Agree with IV fluids for now. Potassium 5.4 on admission, now hypokalemic 2.8.   Creatinine increased today. Appears patient had some persistent hypotension overnight. Goals of care discussion this morning resulted in patient DNR/DNI and will discharge with hospice.   Lab Results  Component Value Date   CREATININE 3.02 (H) 03/15/2024   CREATININE 2.49 (H) 03/14/2024   CREATININE 2.61 (H) 03/13/2024    Intake/Output Summary (Last 24 hours) at 03/15/2024 1212 Last data filed at 03/15/2024 0546 Gross per 24 hour  Intake 2974.66 ml  Output 1850 ml  Net  1124.66 ml   2.  Acute metabolic acidosis likely due to severe dehydration and kidney injury.  Continue sodium bicarbonate  at 100 mL/h.   3.  Hypotension with history of hypertension.  Likely due to volume losses.  Hypotension overnight.     LOS: 5 Tonya [name] 7/24/202512:12 PM

## 2024-03-15 NOTE — Progress Notes (Signed)
 Checked in on patient around mid-day Appreciate help from PCCU team Appreciate help from Palliative team Albumin  running BP still quite low See IPAL note - DNR/DNI  I confirmed again with patient - offered to call her family but she asked me not to. She is alert/oriented at time of this request. She states husband will be here later today.

## 2024-03-15 NOTE — Progress Notes (Signed)
 Referring Provider(s): Dr. 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 CT of the abdomen pelvis showed marked fluid distention of the stomach which may represent gastric outlet obstruction versus functional obstruction. An NG tube was placed for decompression. She underwent EGD 03/01/24 which revealed a large gastric mass oozing blood and a separate pyloric mass going into the duodenum and was obstructing. After discussions with surgery, IR, and hospitalist agreed on placement of a venting G-tube for symptomatic relief. Patient received the venting G-tube on 7/14. Patient was subsequently discharged home on TPN through PICC line.   Subjective:  Patient is lying in bed in no acute distress. Admits to some worsening shortness of breath over the past few days. States that her abdominal discomfort is improving and denies any concerns with her g-tube since repositioning on 7/22.   Allergies: Patient has no known allergies.  Medications: Prior to Admission medications   Medication Sig Start Date End Date Taking? Authorizing Provider  bisacodyl  (DULCOLAX) 10 MG suppository Place 1 suppository (10 mg total) rectally daily as needed for moderate constipation. 03/08/24  Yes Caleen Qualia, MD  levothyroxine  (SYNTHROID , LEVOTHROID) 100 MCG/5ML SOLN injection Inject 10 mLs (200 mcg total) into the vein in the morning. 03/09/24  Yes Caleen Qualia, MD  ondansetron  (ZOFRAN ) 4 MG/2ML SOLN injection Inject 2 mLs (4 mg total) into the vein every 6 (six) hours as needed for nausea or vomiting. 03/08/24  Yes Caleen Qualia, MD  pantoprazole  (PROTONIX ) 40 MG injection Inject 40 mg into the vein daily. 03/08/24  Yes Caleen Qualia, MD      Vital Signs: BP (!) 82/53 (BP Location: Left Arm)   Pulse (!) 107   Temp (!) 97.4 F (36.3 C)   Resp (!) 26   Ht 5' 4 (1.626 m)   Wt 214 lb 1.1 oz (97.1 kg)   LMP 07/22/2016   SpO2 97%   BMI 36.74 kg/m   Physical Exam Vitals reviewed.  HENT:     Mouth/Throat:     Mouth: Mucous membranes are dry.  Cardiovascular:     Rate and Rhythm: Normal rate.  Pulmonary:     Effort: Pulmonary effort is normal.  Abdominal:     General: Abdomen is flat.     Palpations: Abdomen is soft.     Tenderness: There is abdominal tenderness (minimal throughout).     Comments: G-tube draining well w/ ~300cc yellow fluid in bag; T-tacks not present; overlying dressing is clean and dry   Skin:    General: Skin is warm and dry.  Neurological:     Mental Status: She is alert.     Labs:  CBC: Recent Labs    03/12/24 0500 03/13/24 0441 03/14/24 0453 03/15/24 0839  WBC 23.8* 24.6* 25.5* 29.1*  HGB 10.4* 11.1* 10.4* 9.8*  HCT 30.4* 33.8* 30.8* 29.7*  PLT 176 179 152 118*    COAGS: Recent Labs    03/05/24 0610 03/10/24 0512  INR 1.0 1.0    BMP: Recent Labs    03/12/24 0500 03/13/24 0441 03/14/24 0453 03/14/24 2029 03/15/24 0743  NA 138 139 135  --  135  K 3.5 4.1 2.8* 4.4 4.0  CL 105 107 105  --  101  CO2  15* 16* 13*  --  15*  GLUCOSE 148* 114* 155*  --  156*  BUN 111* 149* 150*  --  165*  CALCIUM  8.4* 8.5* 8.3*  --  8.2*  CREATININE 2.49* 2.61* 2.49*  --  3.02*  GFRNONAA 22* 21* 22*  --  17*    LIVER FUNCTION TESTS: Recent Labs    03/05/24 0610 03/08/24 0520 03/09/24 1119 03/10/24 0512  BILITOT 0.4 0.6 0.5 0.7  AST 31 43* 43* 50*  ALT 8 8 8 10   ALKPHOS 56 78 90 90  PROT 5.8* 5.7* 6.0* 5.5*  ALBUMIN  2.5* 2.2* 2.3* 2.0*    Assessment and Plan:  Gastric outlet obstruction s/p venting g-tube: Vennela Jutte is a 58 y.o. female with a history of metastatic endometrial cancer with gastric outlet obstruction. She underwent gastrostomy tube placement in IR on  03/05/24 followed by bedside replacement of tube on 7/22 due to leaking from the site. Patient reports improvement in g-tube functioning followed by decrease in abdominal discomfort.   -Appropriately tender throughout, though she reports improvement overall -T-tacks spontaneously removed on 7/22 with manipulation of tube. No t-tacks remain. -Venting tubing is clear/not clogged with ~300cc of clear, yellow fluid in drainage bag -G-tube appears to be functioning without difficulty and patient reporting improvement  IR will sign off at this time Please feel free to reach out with any additional questions or concerns  Thank you for allowing our service to participate in Letzy Gullickson 's care.   Electronically Signed: Glennon CHRISTELLA Bal, PA-C 03/15/2024, 12:33 PM    I spent a total of 15 Minutes at the the patient's bedside AND on the patient's hospital floor or unit, greater than 50% of which was counseling/coordinating care for gastrostomy tube follow up.

## 2024-03-15 NOTE — Progress Notes (Signed)
 PROGRESS NOTE    Tonya [name]   FMW:[id number] DOB: 09/22/1965  DOA: 03/09/2024 Date of Service: 03/15/24 which is hospital day 5  PCP: Cleotilde Oneil FALCON, MD    Hospital course / significant events:   HPI: Tonya [name] is a pleasant 58 y.o. female with medical history significant for metastatic stage IV endometrial cancer, CAD, gastric reflux, hypertension, gastric outlet obstruction s/p PEG tube who was recently admitted to [facility] and discharged between 02/29/2024 and 03/08/2024 after being treated for intractable nausea vomiting due to gastric mass s/p PEG tube.  Patient was discharged home on PICC line, TPN and PEG tube. Weakness since that discharge and presented again to ED.   07/18: to ED. Dx hyperkalemia 5.6, severe dehydration/AKI with BUN 117 and creatinine 2.33 and leukocytosis at 24.3 though leukocytosis appeared chronic/stable previous admission  07/19 - 07/21: fluctuating lytes, continue TPN 07/22: leak at PEG tube site today, IR following. Renal fxn worse Cr 2.6.  07/23: Cr to 2.5, low K, high Phos, stable WBC. Recs for STR/SNF. Placement will be difficult w/ TPN, TOC following  07/24: hypotensive overnight, started midodrine , gave bolus fluids, in AM still soft BP. Labs pending, CXR concern for pleural effusion / pneumonia. Have asked PCCU to evaluate may need pressors. Confirmed code status FULL CODE     Consultants:  Nephrology  Palliative Care  PCCU  Procedures/Surgeries: none      ASSESSMENT & PLAN:   Acute kidney injury secondary to volume depletion Renal function was normal a month ago however her creatinine went up to 2.3 on presentation Continue IV fluid resuscitation as recommended by nephrology We will evaluate the need for IV fluid on daily basis given underlying CHF to prevent overload Monitor BMP  Generalized weakness/deconditioning PT OT on board and recommending SNF   Hypotension Midodrine  Fluids w/ caution, recent echo w/  preserved EF but diminished diastolic fxn  Pleural effusion PCCU to eval for thoracentesis  Leukocytosis with no signs of infection Urinalysis normal as well as chest x-ray reviewed that did not show any infiltrate, Abdominal CT scan did not show any acute pathology aside findings of possible urinary bladder metastasis Monitor CBC Consider abx pna - PCCU eval pending  may get further imageing   Hyperkalemia - resolved May be related to dehydration, she is also on TPN Montior BMP  Hypokalemia Replace as needed Monitor BMP   Metastatic endometrial cancer with gastric outlet obstruction s/p G tube for venting Abdominal ascites 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 per oncology at outpatient Palliative care been consulted  G tube care Main purpose for venting  Intermittent suction of Gtube especially prior to PT/activity  IR following    Malnutrition due to unable to eat due to gastric outlet obstruction Patient may take ice chips Continue TPN for nutrition and dietitian recommendations   Hypothyroidism Continue thyroxine IV   HFpEF Does not appear to be fluid overloaded Will continue to monitor   GERD Protonix  IV   GOALS OF CARE Discussed this morning w/ patient re: dropping BP, pending labs but consistent metabolic derangements, now SOB, concern for effusion - I advised if she is going into shock and her heart fails despite our best efforts at reversing causes, then I would not want to put her through painful CPR which would not result in benefit especially given underlying cancer She states she would like to talk to her family. She notes her husband doesn't know how to run the house / finances and she needs to  help him. She'd like to talk to her siblings. I advised I will do whatever SHE wants to do, and that her health/comfort is most important thing to focus on. She confirms FULL CODE, ok with CPR and intubation    Class 2 obesity based on BMI: Body mass index is 36.86  kg/m.SABRA Significantly low or high BMI is associated with higher medical risk.  Underweight - under 18  overweight - 25 to 29 obese - 30 or more Class 1 obesity: BMI of 30.0 to 34 Class 2 obesity: BMI of 35.0 to 39 Class 3 obesity: BMI of 40.0 to 49 Super Morbid Obesity: BMI 50-59 Super-super Morbid Obesity: BMI 60+ Healthy nutrition and physical activity advised as adjunct to other disease management and risk reduction treatments    DVT prophylaxis: lovenox  IV fluids: bicarb continuous IV fluids  Nutrition: TPN Central lines / other devices: PICC, Gtube, chemo port   Code Status: FULL CODE ACP documentation reviewed: has GOC form on file in [facility] but no formal advanced directive / MOST   TOC needs: SNF rehab placement Medical barriers to dispo: electrolyte fluctuations, renal function, TPN adjusting, hypotensive. Expected medical readiness for discharge several more days .              Subjective / Brief ROS:  Patient reports tired, exhausted Reprots SOB no CP Pain controlled.  Denies new weakness.    Family Communication: none at bedside - I offered to call family to discuss her situation and she decliend     Objective Findings:  Vitals:   03/15/24 0007 03/15/24 0544 03/15/24 0546 03/15/24 0725  BP: 93/63 (!) 79/52 (!) 74/64 (!) 79/57  Pulse: (!) 105 (!) 108 (!) 101 (!) 106  Resp:  18  18  Temp:    98 F (36.7 C)  TempSrc:    Oral  SpO2: 100% (!) 78%  99%  Weight:   97.1 kg   Height:        Intake/Output Summary (Last 24 hours) at 03/15/2024 0857 Last data filed at 03/15/2024 0546 Gross per 24 hour  Intake 2974.66 ml  Output 1850 ml  Net 1124.66 ml   Filed Weights   03/13/24 0500 03/14/24 0500 03/15/24 0546  Weight: 92.4 kg 97.4 kg 97.1 kg    Examination:  Physical Exam Constitutional:      General: She is not in acute distress.    Appearance: She is ill-appearing.  Cardiovascular:     Rate and Rhythm: Regular rhythm. Tachycardia present.      Heart sounds: Normal heart sounds.  Pulmonary:     Effort: Respiratory distress (increased WOB) present.     Breath sounds: Rales present. No wheezing or rhonchi.     Comments: Increased WOB, she states this comes and goes  Abdominal:     General: There is distension.     Palpations: Abdomen is soft.     Tenderness: There is no abdominal tenderness. There is no guarding.  Skin:    General: Skin is dry.     Coloration: Skin is pale.  Neurological:     General: No focal deficit present.     Mental Status: She is alert and oriented to person, place, and time.  Psychiatric:        Mood and Affect: Mood normal.        Behavior: Behavior normal.          Scheduled Medications:   Chlorhexidine  Gluconate Cloth  6 each Topical Daily  enoxaparin  (LOVENOX ) injection  30 mg Subcutaneous Q24H   insulin  aspart  0-20 Units Subcutaneous Q6H   levothyroxine   200 mcg Intravenous q AM   pantoprazole   40 mg Intravenous Daily   simethicone   40 mg Per Tube BID    Continuous Infusions:  sodium bicarbonate  150 mEq in dextrose  5 % 1,150 mL infusion 100 mL/hr at 03/15/24 0612   TPN ADULT (ION) 60 mL/hr at 03/15/24 0310    PRN Medications:  acetaminophen  **OR** acetaminophen , bisacodyl , methocarbamol  (ROBAXIN ) injection, ondansetron  **OR** ondansetron  (ZOFRAN ) IV  Antimicrobials from admission:  Anti-infectives (From admission, onward)    None           Data Reviewed:  I have personally reviewed the following...  CBC: Recent Labs  Lab 03/10/24 0512 03/11/24 0938 03/12/24 0500 03/13/24 0441 03/14/24 0453  WBC 21.0* 23.4* 23.8* 24.6* 25.5*  NEUTROABS  --  20.9* 21.6* 22.6* 23.7*  HGB 10.3* 11.0* 10.4* 11.1* 10.4*  HCT 29.6* 32.6* 30.4* 33.8* 30.8*  MCV 96.4 98.8 97.4 99.7 97.5  PLT 194 184 176 179 152   Basic Metabolic Panel: Recent Labs  Lab 03/10/24 0512 03/11/24 0938 03/12/24 0500 03/13/24 0441 03/14/24 0453 03/14/24 2029  NA 136 134* 138 139 135  --   K  4.8 5.0 3.5 4.1 2.8* 4.4  CL 102 103 105 107 105  --   CO2 20* 18* 15* 16* 13*  --   GLUCOSE 91 123* 148* 114* 155*  --   BUN 124* 128* 111* 149* 150*  --   CREATININE 2.32* 2.46* 2.49* 2.61* 2.49*  --   CALCIUM  8.4* 8.3* 8.4* 8.5* 8.3*  --   MG  --   --  1.9  --   --   --   PHOS 5.0*  --  5.8*  --  5.3*  --    GFR: Estimated Creatinine Clearance: 27.5 mL/min (A) (by C-G formula based on SCr of 2.49 mg/dL (H)). Liver Function Tests: Recent Labs  Lab 03/09/24 1119 03/10/24 0512  AST 43* 50*  ALT 8 10  ALKPHOS 90 90  BILITOT 0.5 0.7  PROT 6.0* 5.5*  ALBUMIN  2.3* 2.0*   No results for input(s): LIPASE, AMYLASE in the last 168 hours. No results for input(s): AMMONIA in the last 168 hours. Coagulation Profile: Recent Labs  Lab 03/10/24 0512  INR 1.0   Cardiac Enzymes: Recent Labs  Lab 03/09/24 1119  CKTOTAL 25*   BNP (last 3 results) No results for input(s): PROBNP in the last 8760 hours. HbA1C: No results for input(s): HGBA1C in the last 72 hours. CBG: Recent Labs  Lab 03/14/24 1738 03/14/24 2008 03/15/24 0010 03/15/24 0547 03/15/24 0757  GLUCAP 108* 137* 159* 175* 169*   Lipid Profile: No results for input(s): CHOL, HDL, LDLCALC, TRIG, CHOLHDL, LDLDIRECT in the last 72 hours.  Thyroid  Function Tests: No results for input(s): TSH, T4TOTAL, FREET4, T3FREE, THYROIDAB in the last 72 hours. Anemia Panel: No results for input(s): VITAMINB12, FOLATE, FERRITIN, TIBC, IRON, RETICCTPCT in the last 72 hours. Most Recent Urinalysis On File:     Component Value Date/Time   COLORURINE YELLOW (A) 03/09/2024 0948   APPEARANCEUR CLOUDY (A) 03/09/2024 0948   LABSPEC 1.016 03/09/2024 0948   PHURINE 5.0 03/09/2024 0948   GLUCOSEU NEGATIVE 03/09/2024 0948   HGBUR NEGATIVE 03/09/2024 0948   BILIRUBINUR NEGATIVE 03/09/2024 0948   KETONESUR NEGATIVE 03/09/2024 0948   PROTEINUR NEGATIVE 03/09/2024 0948   NITRITE NEGATIVE  03/09/2024 0948   LEUKOCYTESUR MODERATE (  A) 03/09/2024 0948   Sepsis Labs: @LABRCNTIP (procalcitonin:4,lacticidven:4) Microbiology: No results found for this or any previous visit (from the past 240 hours).    Radiology Studies last 3 days: DG Chest Port 1 View Result Date: 03/15/2024 CLINICAL DATA:  Shortness of breath, weakness. Metastatic endometrial cancer. EXAM: PORTABLE CHEST 1 VIEW COMPARISON:  03/07/2024 FINDINGS: Power injectable right Port-A-Cath tip: Right atrium. Coronary and aortic atherosclerosis. Retrocardiac airspace opacity mild blunting of the left lateral costophrenic angle. Appearance favors left lower lobe atelectasis or pneumonia potentially in the setting of a pleural effusion. Mild fullness of the left hilum, mild bilateral hilar fullness, vascular versus mild hilar adenopathy. Bandlike collection of barium in the stomach. IMPRESSION: 1. Retrocardiac airspace opacity, favoring left lower lobe atelectasis or pneumonia potentially in the setting of a pleural effusion. 2. Mild fullness of the left hilum, mild bilateral hilar fullness, vascular versus mild hilar adenopathy. 3. Coronary and aortic atherosclerosis. 4. Bandlike collection of barium in the stomach. Electronically Signed   By: Ryan Salvage M.D.   On: 03/15/2024 08:42   CT ABDOMEN PELVIS WO CONTRAST Result Date: 03/12/2024 EXAM: CT ABDOMEN AND PELVIS WITHOUT CONTRAST 03/12/2024 08:25:13 PM TECHNIQUE: CT of the abdomen and pelvis was performed without the administration of intravenous contrast. Multiplanar reformatted images are provided for review. Automated exposure control, iterative reconstruction, and/or weight based adjustment of the mA/kV was utilized to reduce the radiation dose to as low as reasonably achievable. COMPARISON: 02/29/2024 CLINICAL HISTORY: Abdominal pain, acute, nonlocalized. FINDINGS: LOWER CHEST: Moderate bilateral pleural effusions, left greater than right, new/increased. LIVER: Cirrhosis.  GALLBLADDER AND BILE DUCTS: Vicarious excretion of contrast in the gallbladder. SPLEEN: No acute abnormality. PANCREAS: No acute abnormality. ADRENAL GLANDS: No acute abnormality. KIDNEYS, URETERS AND BLADDER: No stones in the kidneys or ureters. No hydronephrosis. No perinephric or periureteral stranding. Right posterolateral bladder wall thickening, suspicious for primary bladder carcinoma versus serosal metastases. GI AND BOWEL: Suspected normal appendix. Percutaneous gastrostomy and satisfactory position, new. Loculated ascites along the posterior gastric body measuring 5.0 x 8.0 cm, new. Mild free air beneath the anterior abdominal wall, likely post-procedural. PERITONEUM AND RETROPERITONEUM: Moderate abdominal pelvic ascites, mildly progressive. Mild omental nodularity beneath the left anterior abdominal wall, suggesting peritoneal disease. VASCULATURE: Aorta is normal in caliber. LYMPH NODES: Upper abdominal and retroperitoneal lymphadenopathy, including a dominant 2.2 cm protocaval node. Associated bilateral pelvic lymphadenopathy, including a 2.3 cm short axis left external iliac node. REPRODUCTIVE ORGANS: Status post hysterectomy. BONES AND SOFT TISSUES: Body wall edema. IMPRESSION: 1. Interval percutaneous gastrostomy in satisfactory position. Mild free air, likely postprocedural. 2. Moderate abdominal pelvic ascites, mildly progressive. Associated mild omental nodularity beneath the left anterior abdominal wall, suggesting peritoneal disease. 3. Right posterolateral bladder wall thickening, suspicious for primary bladder carcinoma versus serosal metastases. 4. Stable abdominopelvic nodal metastases. 5. Moderate bilateral pleural effusions, left greater than right, new/increased. 6. Additional ancillary findings as above. Electronically signed by: Pinkie Pebbles MD 03/12/2024 08:35 PM EDT RP Workstation: [id number]       Time spent: 50 min     Laneta Blunt, DO [facility] 03/15/2024, 8:57 AM    Dictation software may have been used to generate the above note. Typos may occur and escape review in typed/dictated notes. Please contact Dr Blunt directly for clarity if needed.  Staff may message me via secure chat in Epic  but this may not receive an immediate response,  please page me for urgent matters!  If 7PM-7AM, please contact night coverage [email or web address]

## 2024-03-15 NOTE — Significant Event (Addendum)
       CROSS COVER NOTE  NAME: Katalin Colledge MRN: [id number] DOB : November 29, 1965 ATTENDING PHYSICIAN: Marsa Edelman, DO    Date of Service   03/15/2024   HPI/Events of Note   Notified by rn at 1930 of ongoing red mews secondary to ongoing hypotension that previously responded to midodrine  but patient currently npo due to newly found obstructing abdominal mass Patient with metastatic endometrial cancer.  Discussion today with code status change to DNR/DNI  Interventions   Assessment/Plan:    03/15/2024   10:30 PM 03/15/2024    5:10 PM 03/15/2024    1:26 PM  Vitals with BMI  Systolic 143 82 91  Diastolic 883 53 55  Pulse 104 898 107   Discussed with patient he blood pressure issue and possible use of vasopressors only being temporary number fix. Patietn states she just wants to wait till tomorrow when she hears what further treatment options are available with her insurance for her cancer. She states having the SCDs on does help quite a bit and they were not put back after her recent bath SCD AAT Check albumin  - 2.4 Vbg - acidosis with bicarb deficit 1 amp sodium bicarb and repeat vbg in am         Erminio LITTIE Cone NP [facility] Cross Cover 7pm-7am - check amion for availability Pager [phone]

## 2024-03-16 ENCOUNTER — Encounter: Payer: Self-pay | Admitting: Oncology

## 2024-03-16 ENCOUNTER — Ambulatory Visit

## 2024-03-16 DIAGNOSIS — R531 Weakness: Secondary | ICD-10-CM

## 2024-03-16 DIAGNOSIS — N179 Acute kidney failure, unspecified: Secondary | ICD-10-CM

## 2024-03-16 DIAGNOSIS — R579 Shock, unspecified: Secondary | ICD-10-CM | POA: Diagnosis not present

## 2024-03-16 DIAGNOSIS — C541 Malignant neoplasm of endometrium: Secondary | ICD-10-CM | POA: Diagnosis not present

## 2024-03-16 DIAGNOSIS — J90 Pleural effusion, not elsewhere classified: Secondary | ICD-10-CM | POA: Diagnosis not present

## 2024-03-16 DIAGNOSIS — I255 Ischemic cardiomyopathy: Secondary | ICD-10-CM

## 2024-03-16 DIAGNOSIS — J9601 Acute respiratory failure with hypoxia: Secondary | ICD-10-CM | POA: Diagnosis not present

## 2024-03-16 DIAGNOSIS — E860 Dehydration: Secondary | ICD-10-CM | POA: Diagnosis not present

## 2024-03-16 LAB — GLUCOSE, CAPILLARY
Glucose-Capillary: 100 mg/dL — ABNORMAL HIGH (ref 70–99)
Glucose-Capillary: 99 mg/dL (ref 70–99)
Glucose-Capillary: 132 mg/dL — ABNORMAL HIGH (ref 70–99)
Glucose-Capillary: 116 mg/dL — ABNORMAL HIGH (ref 70–99)
Glucose-Capillary: 107 mg/dL — ABNORMAL HIGH (ref 70–99)

## 2024-03-16 LAB — CBC
HCT: 27.4 % — ABNORMAL LOW (ref 36.0–46.0)
HCT: 24.5 % — ABNORMAL LOW (ref 36.0–46.0)
Hemoglobin: 9.4 g/dL — ABNORMAL LOW (ref 12.0–15.0)
Hemoglobin: 8.5 g/dL — ABNORMAL LOW (ref 12.0–15.0)
MCH: 33.3 pg (ref 26.0–34.0)
MCH: 33.5 pg (ref 26.0–34.0)
MCHC: 34.3 g/dL (ref 30.0–36.0)
MCHC: 34.7 g/dL (ref 30.0–36.0)
MCV: 97.2 fL (ref 80.0–100.0)
MCV: 96.5 fL (ref 80.0–100.0)
Platelets: 96 K/uL — ABNORMAL LOW (ref 150–400)
Platelets: 75 K/uL — ABNORMAL LOW (ref 150–400)
RBC: 2.82 MIL/uL — ABNORMAL LOW (ref 3.87–5.11)
RBC: 2.54 MIL/uL — ABNORMAL LOW (ref 3.87–5.11)
RDW: 17.7 % — ABNORMAL HIGH (ref 11.5–15.5)
RDW: 17.9 % — ABNORMAL HIGH (ref 11.5–15.5)
WBC: 26.9 K/uL — ABNORMAL HIGH (ref 4.0–10.5)
WBC: 23.3 K/uL — ABNORMAL HIGH (ref 4.0–10.5)
nRBC: 0 % (ref 0.0–0.2)
nRBC: 0.1 % (ref 0.0–0.2)

## 2024-03-16 LAB — COMPREHENSIVE METABOLIC PANEL WITH GFR
ALT: 19 U/L (ref 0–44)
AST: 70 U/L — ABNORMAL HIGH (ref 15–41)
Albumin: 2 g/dL — ABNORMAL LOW (ref 3.5–5.0)
Alkaline Phosphatase: 231 U/L — ABNORMAL HIGH (ref 38–126)
Anion gap: 19 — ABNORMAL HIGH (ref 5–15)
BUN: 179 mg/dL — ABNORMAL HIGH (ref 6–20)
CO2: 20 mmol/L — ABNORMAL LOW (ref 22–32)
Calcium: 8.9 mg/dL (ref 8.9–10.3)
Chloride: 100 mmol/L (ref 98–111)
Creatinine, Ser: 3.44 mg/dL — ABNORMAL HIGH (ref 0.44–1.00)
GFR, Estimated: 15 mL/min — ABNORMAL LOW (ref >60)
Glucose, Bld: 106 mg/dL — ABNORMAL HIGH (ref 70–99)
Potassium: 4.3 mmol/L (ref 3.5–5.1)
Sodium: 139 mmol/L (ref 135–145)
Total Bilirubin: 1.2 mg/dL (ref 0.0–1.2)
Total Protein: 5 g/dL — ABNORMAL LOW (ref 6.5–8.1)

## 2024-03-16 LAB — PREPARE RBC (CROSSMATCH)

## 2024-03-16 MED ORDER — HEPARIN SODIUM (PORCINE) 5000 UNIT/ML IJ SOLN: 5000.0000 [IU] | Freq: Three times a day (TID) | INTRAMUSCULAR | Status: DC

## 2024-03-16 MED ORDER — TRACE MINERALS CU-MN-SE-ZN 300-55-60-3000 MCG/ML IV SOLN
INTRAVENOUS | Status: DC
  Filled 2024-03-16: qty 572

## 2024-03-16 MED ORDER — ALBUMIN HUMAN 25 % IV SOLN
50.0000 g | Freq: Once | INTRAVENOUS | Status: AC
  Administered 2024-03-16: 50 g via INTRAVENOUS
  Filled 2024-03-16: qty 200

## 2024-03-16 MED ORDER — SODIUM CHLORIDE 0.9% IV SOLUTION: Freq: Once | INTRAVENOUS | Status: AC

## 2024-03-16 MED ORDER — SODIUM BICARBONATE 8.4 % IV SOLN
50.0000 meq | Freq: Once | INTRAVENOUS | Status: AC
  Administered 2024-03-16: 50 meq via INTRAVENOUS
  Filled 2024-03-16: qty 50

## 2024-03-16 NOTE — Progress Notes (Signed)
 Alerted by RN hematochezia, painless   Repeat CBC, Hgb dropping  Informal d/w GI Dr Onita - not a candidate for scope, temporary hemostatic measures could be applied to bleeding mass(es) but ultimately futile given spread of cancer, and either way would not tolerate anesthesia/prep for scope. Option to get CTA w/ possible IR intervention.   I discussed findings/options with patient. Advised we can do a scan to look for bleeding and we might be able to stop a bleed based on what we find, but not guaranteed. Risk would include contrast will further reduce renal function and predispose to encephalopathy. Pt asks is that why the stuff is darker meaning the fluid from the g-tube, and I answered yes probably there is bleeding. Pt notes that she wants to maintain clarity to talk to her family when her sister gets here tonight, she does not want to do the scan (CTA). I ask why not to determine her reasoning / see if she demonstrates capacity to understand options. She says well it probably won't really make much difference overall, I just need time for my family to get here later today. She thanked me for the update. As something that might buy some time, I offered blood transfusion to hopefully keep her going until her family got here. If she is having serious bleeding that we cannot stop, pressors/ICU transfer would be futile. She consents to blood transfusion.   A/P GI bleed likely d/t bleeding metastases complicated by thrombocytopenia / anemia. At this time, pt demonstrates understanding of her prognosis and understanding of treatment options/alternatives including the option to do nothing.  Pt has declined CTA to potentially intervene on bleeding, which is reasonable given risk/benefit profile and given patient's stated goal/priority of remaining conscious to meet with family later this evening  Hold any anticoag/antiplatelet  PRBC transfusion recognizing this is a stop-gap measure to buy time for  her family to get to bedside and we cannot transfuse multiple units indefinitely, especially as this would likely only exacerbate heart failure.  Not meeting threshold for Plt transfusion Furthermore, pressor support for hypotensive shock without ability to correct the underlying bleeding would be futile, would not transfer to ICU.  Holding fluids as she is already fluid overloaded and withPRBC do not want to cause flash pulmonary edema / respiratory distress  If decompensating, would treat symptoms as they arise. Pt is very high risk of sudden rapid deterioration.            CRITICAL CARE Performed by: Laneta Blunt   Total critical care time: 45 minutes  Critical care time was exclusive of separately billable procedures and treating other patients.  Critical care was necessary to treat or prevent imminent or life-threatening deterioration.  Critical care was time spent personally by me on the following activities: development of treatment plan with patient and/or surrogate as well as nursing, discussions with consultants, evaluation of patient's response to treatment, examination of patient, obtaining history from patient or surrogate, ordering and performing treatments and interventions, ordering and review of laboratory studies, ordering and review of radiographic studies, pulse oximetry and re-evaluation of patient's condition.

## 2024-03-16 NOTE — TOC Progression Note (Signed)
 Transition of Care [facility]) - Progression Note    Patient Details  Name: Tonya [name] MRN: [id number] Date of Birth: [birth year]  [facility]) CM/SW Contact  Dalia GORMAN Fuse, RN Phone Number: 03/16/2024, 10:56 AM  Clinical Narrative:    TOC spoke with the patient in her room. The patient requested for TOC to call her insurance company to verify benefits. TOC explained that we typically don't verify benefits and their is very limited info the payor would provide TOC do to HIPAA. TOC advised the patient to call the number on the back of her insurance card and ask them the questions. The patient advised there is no one at home to assist with TPN infusions; therefore, she would like to go to a SNF.  FL2 sent out, bed offers are pending. TOC received a call from Ronal Fret at [facility], she is not setup for TPN infusions at [facility]. She can offer a bed at [facility] in [place]. TOC outreached to [facility] [phone] in [place], awaiting f/u.   Expected Discharge Plan: Skilled Nursing Facility Barriers to Discharge: Continued Medical Work up, SNF Pending bed offer    DSS Service county: [facility]          Expected Discharge Plan and Services     Post Acute Care Choice: Skilled Nursing Facility Living arrangements for the past 2 months: Single Family Home                                       Social Drivers of Health (SDOH) Interventions SDOH Screenings   Food Insecurity: No Food Insecurity (03/09/2024)  Housing: Low Risk  (03/09/2024)  Transportation Needs: No Transportation Needs (03/09/2024)  Utilities: Not At Risk (03/09/2024)  Depression (PHQ2-9): Low Risk  (02/22/2024)  Financial Resource Strain: Low Risk  (07/23/2019)  Physical Activity: Unknown (07/23/2019)  Social Connections: Moderately Integrated (03/09/2024)  Stress: No Stress Concern Present (07/23/2019)  Tobacco Use: Low Risk  (03/09/2024)  Health Literacy: Adequate Health  Literacy (09/29/2023)    Readmission Risk Interventions    03/03/2024    2:15 PM 10/05/2023    2:26 PM  Readmission Risk Prevention Plan  Transportation Screening Complete Complete  HRI or Home Care Consult  Complete  Palliative Care Screening  Complete  Medication Review (RN Care Manager) Complete Complete  PCP or Specialist appointment within 3-5 days of discharge Complete   HRI or Home Care Consult Complete   Palliative Care Screening Not Applicable   Skilled Nursing Facility Not Applicable

## 2024-03-16 NOTE — Progress Notes (Signed)
 Patient has some flank red blood in bedpan with stool. MD made aware heparin  held.

## 2024-03-16 NOTE — IPAL (Signed)
  Interdisciplinary Goals of Care Family Meeting   Date carried out: 03/16/2024  Location of the meeting: Bedside  Member's involved: Physician, Family Member or next of kin, and Other: Patient's husband, Garrel. Attending of record Dr. Marsa present for part of the discussion.  Durable Power of Insurance risk surveyor: Patient    Discussion: We discussed goals of care for [name] .  We went, in brief, over all the generally guarded prognosis for Ms. Umble's condition. Explained that she has low blood pressure and has worsening renal dysfunction and uremia. I explained that my main concern is uremia and the potential for encephalopathy. Explained that with such high BUN, it is very likely that Ms. Najarro would loose her capacity to make decisions and comprehend what is happening to her. Patient remains adamant about not wanting us  to discuss her cancer diagnosis and condition with her husband, and wants to wait for her sister to arrive before having this conversation.  Again revisited this conversation in the presence of Dr. Marsa, who encouraged the patient to have this discussion with Garrel. Also explained that if she becomes more encephalopathic, we would have to have this conversation with him.  Code status:   Code Status: Limited: Do not attempt resuscitation (DNR) -DNR-LIMITED -Do Not Intubate/DNI    Disposition: Continue current acute care  Time spent for the meeting: 25 minutes    Belva November, MD  03/16/2024, 1:34 PM

## 2024-03-16 NOTE — Plan of Care (Signed)

## 2024-03-16 NOTE — Progress Notes (Signed)
   03/16/24 1200  Spiritual Encounters  Type of Visit Initial  Care provided to: Patient  Conversation partners present during encounter Nurse  Referral source Nurse (RN/NT/LPN)  Reason for visit Advance directives  OnCall Visit No   Chaplain visited patient due to an Graf in the EPIC system.  Patient shared she wasn't ready to discuss the AD and so Chaplain asked patient to have the Nurse page us  when she's ready to discuss.    Rev. Rana M. Nicholaus, M.Div. Chaplain Resident  [facility]

## 2024-03-16 NOTE — Progress Notes (Addendum)
 PROGRESS NOTE    Tonya [name]   FMW:[id number] DOB: 02-06-1966  DOA: 03/09/2024 Date of Service: 03/16/24 which is hospital day 6  PCP: Cleotilde Oneil FALCON, MD    Hospital course / significant events:   HPI: Tonya [name] is a pleasant 58 y.o. female with medical history significant for metastatic stage IV endometrial cancer, CAD, gastric reflux, hypertension, gastric outlet obstruction s/p PEG tube who was recently admitted to [facility] and discharged between 02/29/2024 and 03/08/2024 after being treated for intractable nausea vomiting due to gastric mass s/p PEG tube.  Patient was discharged home on PICC line, TPN and PEG tube. Weakness since that discharge and presented again to ED.   07/18: to ED. Dx hyperkalemia 5.6, severe dehydration/AKI with BUN 117 and creatinine 2.33 and leukocytosis at 24.3 though leukocytosis appeared chronic/stable previous admission  07/19 - 07/21: fluctuating lytes, continue TPN 07/22: leak at PEG tube site today, IR following. Renal fxn worse Cr 2.6.  07/23: Cr to 2.5, low K, high Phos, stable WBC. Recs for STR/SNF. Placement will be difficult w/ TPN, TOC following  07/24: hypotensive overnight, started midodrine , gave bolus fluids, in AM still soft BP. ICU consulted - giving albumin , holding on thoracentesis given non-severe effusion. Onc consulted - not candidate for further tx. Code status updated to DNR/DNI, pt has asked us  not to discuss her condition w/ family right now.  07/25: Renal function continues to worsen, albumin  still low, BP low, Plt lower today,      Consultants:  Nephrology  Palliative Care  PCCU Oncology   Procedures/Surgeries: none      ASSESSMENT & PLAN:   Acute kidney injury secondary to volume depletion Elevated and worsening BUN and Cr Renal function was normal a month ago however her creatinine went up to 2.3 on presentation now at 3.44 03/16/24  Nephrology following has been on bicarb gtt however concern  for fluid overload / CHF We will evaluate the need for IV fluid on daily basis given underlying CHF to prevent overload Monitor BMP  Metastatic endometrial cancer with gastric outlet obstruction s/p G tube for venting Abdominal ascites 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 (Appreciate input from Dr Rennie - see his consult note 03/15/24)  Hypotension Midodrine  may not be absorbing well thru Gtube Albumin  as needed Fluids w/ caution, recent echo w/ preserved EF but diminished diastolic fxn  HFpEF Holding fluids as able but low BP Unable to diurese d/t renal function   SIRS Elevated HR likely compensating for low BP  Elevated RR likely compensating for metabolic acidosis Leukocytosis likely related to cancer  Worsening Thrombocytopenia likely related to inflammatory state  Elevated Alk Phos likely related to inflammatory state  Treating underlying cause(s) as able but metastatic cancer   Pleural effusion Holding on thoracentesis for now  Leukocytosis with no signs of infection Urinalysis normal as well as chest x-ray reviewed that did not show any infiltrate, Abdominal CT scan did not show any acute pathology aside findings of possible urinary bladder metastasis Monitor CBC Consider abx pna - PCCU eval pending  may get further imageing   Hyperkalemia - resolved May be related to dehydration, she is also on TPN Montior BMP  Hypokalemia Replace as needed Monitor BMP  G tube care Main purpose for venting  Intermittent suction of Gtube especially prior to PT/activity  IR following    Malnutrition due to  unable to eat due to gastric outlet obstruction Patient may take ice chips Continue TPN for nutrition and dietitian recommendations   Hypothyroidism Continue thyroxine IV   HFpEF Does not appear to be fluid overloaded Will  continue to monitor   GERD Protonix  IV   Generalized weakness/deconditioning PT OT on board and recommending SNF  GOALS OF CARE Discussed this morning w/ patient re: her complicated clinical status and high risk of abrupt decompensation though if we continue to treat as able she may have weeks, less likely month+ based on current rate of decline.  Goal is to get to probably LTACH, discussions ongoing re: GOC. She states her family will not be able to care for her safely at home.  For now goal is to buy what time we can until her sister gets here early next week, so if shock develops then pressors would be reasonable for a time-limited trial She is still asking that we do NOT speak with her family about her status. I advised her that if/when she deteriorates I will have to speak to family about her condition so that her surrogates/NOK can make informed decisions about her care. Given multiple family members, I advised her to complete HCPOA to designate an [name], museum/gallery and at least one alternate. Chaplain consult placed to complete HCPOA at minimum, living will if desired but pt is stressed by these conversations so may leave living will discussion for later.    Class 2 obesity based on BMI: Body mass index is 36.86 kg/m.SABRA Significantly low or high BMI is associated with higher medical risk.  Underweight - under 18  overweight - 25 to 29 obese - 30 or more Class 1 obesity: BMI of 30.0 to 34 Class 2 obesity: BMI of 35.0 to 39 Class 3 obesity: BMI of 40.0 to 49 Super Morbid Obesity: BMI 50-59 Super-super Morbid Obesity: BMI 60+ Healthy nutrition and physical activity advised as adjunct to other disease management and risk reduction treatments    DVT prophylaxis: lovenox  dc d.t low Plt /bleed risk  IV fluids: none at this time given CHF Nutrition: TPN Central lines / other devices: PICC, Gtube, chemo port   Code Status: DNR/DNI ACP documentation reviewed: has GOC form on file  in [facility] but no formal advanced directive / MOST - have advised HCPOA as above   TOC needs: SNF rehab placement Medical barriers to dispo: electrolyte fluctuations, renal function, TPN adjusting, hypotensive. Expected medical readiness for discharge several more days .              Subjective / Brief ROS:  Patient reports tired, exhausted Reprots SOB is a bit better No CP Pain controlled.  Denies new weakness.    Family Communication: none at bedside - I visited yesterday evening when husband was at bedside but pt still requesting I do not speak w/ him re clinical status     Objective Findings:  Vitals:   03/16/24 0204 03/16/24 0419 03/16/24 0500 03/16/24 0734  BP: (!) 84/53 (!) 68/44  (!) 78/50  Pulse: (!) 109 (!) 106  (!) 101  Resp:    16  Temp:  97.7 F (36.5 C)  97.6 F (36.4 C)  TempSrc:      SpO2:  96%  97%  Weight:   108.6 kg   Height:        Intake/Output Summary (Last 24 hours) at 03/16/2024 1107 Last data filed at 03/16/2024 0907 Gross per 24 hour  Intake --  Output 1975  ml  Net -1975 ml   Filed Weights   03/14/24 0500 03/15/24 0546 03/16/24 0500  Weight: 97.4 kg 97.1 kg 108.6 kg    Examination:  Physical Exam Constitutional:      General: She is not in acute distress.    Appearance: She is ill-appearing.  Cardiovascular:     Rate and Rhythm: Regular rhythm. Tachycardia present.     Heart sounds: Normal heart sounds.  Pulmonary:     Effort: No respiratory distress (increased WOB but seems better from yesterday).     Breath sounds: Rales present. No wheezing or rhonchi.     Comments: Increased WOB, she states this comes and goes  Abdominal:     General: There is distension.     Palpations: Abdomen is soft.     Tenderness: There is no abdominal tenderness. There is no guarding.  Skin:    General: Skin is dry.     Coloration: Skin is pale.  Neurological:     General: No focal deficit present.     Mental Status: She is alert and  oriented to person, place, and time.  Psychiatric:        Mood and Affect: Mood normal.        Behavior: Behavior normal.          Scheduled Medications:   Chlorhexidine  Gluconate Cloth  6 each Topical Daily   enoxaparin  (LOVENOX ) injection  30 mg Subcutaneous Q24H   insulin  aspart  0-20 Units Subcutaneous Q6H   levothyroxine   200 mcg Intravenous q AM   pantoprazole   40 mg Intravenous Daily   simethicone   40 mg Per Tube BID    Continuous Infusions:  TPN ADULT (ION) 60 mL/hr at 03/15/24 1802    PRN Medications:  acetaminophen  **OR** acetaminophen , bisacodyl , methocarbamol  (ROBAXIN ) injection, ondansetron  **OR** ondansetron  (ZOFRAN ) IV  Antimicrobials from admission:  Anti-infectives (From admission, onward)    None           Data Reviewed:  I have personally reviewed the following...  CBC: Recent Labs  Lab 03/11/24 0938 03/12/24 0500 03/13/24 0441 03/14/24 0453 03/15/24 0839 03/16/24 0353  WBC 23.4* 23.8* 24.6* 25.5* 29.1* 26.9*  NEUTROABS 20.9* 21.6* 22.6* 23.7*  --   --   HGB 11.0* 10.4* 11.1* 10.4* 9.8* 9.4*  HCT 32.6* 30.4* 33.8* 30.8* 29.7* 27.4*  MCV 98.8 97.4 99.7 97.5 98.7 97.2  PLT 184 176 179 152 118* 96*   Basic Metabolic Panel: Recent Labs  Lab 03/10/24 0512 03/11/24 0938 03/12/24 0500 03/13/24 0441 03/14/24 0453 03/14/24 2029 03/15/24 0743 03/16/24 0353  NA 136   < > 138 139 135  --  135 139  K 4.8   < > 3.5 4.1 2.8* 4.4 4.0 4.3  CL 102   < > 105 107 105  --  101 100  CO2 20*   < > 15* 16* 13*  --  15* 20*  GLUCOSE 91   < > 148* 114* 155*  --  156* 106*  BUN 124*   < > 111* 149* 150*  --  165* 179*  CREATININE 2.32*   < > 2.49* 2.61* 2.49*  --  3.02* 3.44*  CALCIUM  8.4*   < > 8.4* 8.5* 8.3*  --  8.2* 8.9  MG  --   --  1.9  --   --   --  1.8  --   PHOS 5.0*  --  5.8*  --  5.3*  --  5.8*  --    < > =  values in this interval not displayed.   GFR: Estimated Creatinine Clearance: 21.2 mL/min (A) (by C-G formula based on SCr of  3.44 mg/dL (H)). Liver Function Tests: Recent Labs  Lab 03/09/24 1119 03/10/24 0512 03/15/24 2216 03/16/24 0353  AST 43* 50*  --  70*  ALT 8 10  --  19  ALKPHOS 90 90  --  231*  BILITOT 0.5 0.7  --  1.2  PROT 6.0* 5.5*  --  5.0*  ALBUMIN  2.3* 2.0* 2.4* 2.0*   No results for input(s): LIPASE, AMYLASE in the last 168 hours. No results for input(s): AMMONIA in the last 168 hours. Coagulation Profile: Recent Labs  Lab 03/10/24 0512  INR 1.0   Cardiac Enzymes: Recent Labs  Lab 03/09/24 1119  CKTOTAL 25*   BNP (last 3 results) No results for input(s): PROBNP in the last 8760 hours. HbA1C: No results for input(s): HGBA1C in the last 72 hours. CBG: Recent Labs  Lab 03/15/24 1706 03/15/24 2026 03/15/24 2358 03/16/24 0502 03/16/24 0735  GLUCAP 103* 104* 127* 100* 99   Lipid Profile: No results for input(s): CHOL, HDL, LDLCALC, TRIG, CHOLHDL, LDLDIRECT in the last 72 hours.  Thyroid  Function Tests: No results for input(s): TSH, T4TOTAL, FREET4, T3FREE, THYROIDAB in the last 72 hours. Anemia Panel: No results for input(s): VITAMINB12, FOLATE, FERRITIN, TIBC, IRON, RETICCTPCT in the last 72 hours. Most Recent Urinalysis On File:     Component Value Date/Time   COLORURINE YELLOW (A) 03/09/2024 0948   APPEARANCEUR CLOUDY (A) 03/09/2024 0948   LABSPEC 1.016 03/09/2024 0948   PHURINE 5.0 03/09/2024 0948   GLUCOSEU NEGATIVE 03/09/2024 0948   HGBUR NEGATIVE 03/09/2024 0948   BILIRUBINUR NEGATIVE 03/09/2024 0948   KETONESUR NEGATIVE 03/09/2024 0948   PROTEINUR NEGATIVE 03/09/2024 0948   NITRITE NEGATIVE 03/09/2024 0948   LEUKOCYTESUR MODERATE (A) 03/09/2024 0948   Sepsis Labs: @LABRCNTIP (procalcitonin:4,lacticidven:4) Microbiology: No results found for this or any previous visit (from the past 240 hours).    Radiology Studies last 3 days: DG Chest Port 1 View Result Date: 03/15/2024 CLINICAL DATA:  Shortness of breath,  weakness. Metastatic endometrial cancer. EXAM: PORTABLE CHEST 1 VIEW COMPARISON:  03/07/2024 FINDINGS: Power injectable right Port-A-Cath tip: Right atrium. Coronary and aortic atherosclerosis. Retrocardiac airspace opacity mild blunting of the left lateral costophrenic angle. Appearance favors left lower lobe atelectasis or pneumonia potentially in the setting of a pleural effusion. Mild fullness of the left hilum, mild bilateral hilar fullness, vascular versus mild hilar adenopathy. Bandlike collection of barium in the stomach. IMPRESSION: 1. Retrocardiac airspace opacity, favoring left lower lobe atelectasis or pneumonia potentially in the setting of a pleural effusion. 2. Mild fullness of the left hilum, mild bilateral hilar fullness, vascular versus mild hilar adenopathy. 3. Coronary and aortic atherosclerosis. 4. Bandlike collection of barium in the stomach. Electronically Signed   By: Ryan Salvage M.D.   On: 03/15/2024 08:42   CT ABDOMEN PELVIS WO CONTRAST Result Date: 03/12/2024 EXAM: CT ABDOMEN AND PELVIS WITHOUT CONTRAST 03/12/2024 08:25:13 PM TECHNIQUE: CT of the abdomen and pelvis was performed without the administration of intravenous contrast. Multiplanar reformatted images are provided for review. Automated exposure control, iterative reconstruction, and/or weight based adjustment of the mA/kV was utilized to reduce the radiation dose to as low as reasonably achievable. COMPARISON: 02/29/2024 CLINICAL HISTORY: Abdominal pain, acute, nonlocalized. FINDINGS: LOWER CHEST: Moderate bilateral pleural effusions, left greater than right, new/increased. LIVER: Cirrhosis. GALLBLADDER AND BILE DUCTS: Vicarious excretion of contrast in the gallbladder. SPLEEN: No acute  abnormality. PANCREAS: No acute abnormality. ADRENAL GLANDS: No acute abnormality. KIDNEYS, URETERS AND BLADDER: No stones in the kidneys or ureters. No hydronephrosis. No perinephric or periureteral stranding. Right posterolateral bladder  wall thickening, suspicious for primary bladder carcinoma versus serosal metastases. GI AND BOWEL: Suspected normal appendix. Percutaneous gastrostomy and satisfactory position, new. Loculated ascites along the posterior gastric body measuring 5.0 x 8.0 cm, new. Mild free air beneath the anterior abdominal wall, likely post-procedural. PERITONEUM AND RETROPERITONEUM: Moderate abdominal pelvic ascites, mildly progressive. Mild omental nodularity beneath the left anterior abdominal wall, suggesting peritoneal disease. VASCULATURE: Aorta is normal in caliber. LYMPH NODES: Upper abdominal and retroperitoneal lymphadenopathy, including a dominant 2.2 cm protocaval node. Associated bilateral pelvic lymphadenopathy, including a 2.3 cm short axis left external iliac node. REPRODUCTIVE ORGANS: Status post hysterectomy. BONES AND SOFT TISSUES: Body wall edema. IMPRESSION: 1. Interval percutaneous gastrostomy in satisfactory position. Mild free air, likely postprocedural. 2. Moderate abdominal pelvic ascites, mildly progressive. Associated mild omental nodularity beneath the left anterior abdominal wall, suggesting peritoneal disease. 3. Right posterolateral bladder wall thickening, suspicious for primary bladder carcinoma versus serosal metastases. 4. Stable abdominopelvic nodal metastases. 5. Moderate bilateral pleural effusions, left greater than right, new/increased. 6. Additional ancillary findings as above. Electronically signed by: Pinkie Pebbles MD 03/12/2024 08:35 PM EDT RP Workstation: [id number]       Time spent: 50 min + additional 25 min this afternoon Advanced care planning: 30 min     Laneta Blunt, DO [facility] 03/16/2024, 11:07 AM    Dictation software may have been used to generate the above note. Typos may occur and escape review in typed/dictated notes. Please contact Dr Blunt directly for clarity if needed.  Staff may message me via secure chat in Epic  but this may  not receive an immediate response,  please page me for urgent matters!  If 7PM-7AM, please contact night coverage [email or web address]

## 2024-03-16 NOTE — Consult Note (Signed)
 PHARMACY - TOTAL PARENTERAL NUTRITION CONSULT NOTE   Indication: Small bowel obstruction  Patient Measurements: Height: 5' 4 (162.6 cm) Weight: 108.6 kg (239 lb 6.7 oz) IBW/kg (Calculated) : 54.7 TPN AdjBW (KG): 62.3 Body mass index is 41.1 kg/m. Usual Weight: N/A  Assessment:  58 y.o. 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 Patient discharged yesterday with plans to follow up with oncology, with new chemotherapy regimen set to initiate 7/22. Patient now back today after weakness with fall at home, now with AKI.  Update 7/22: AKI continues to worsen. Nephrology consulted. Update 7/24: Hypotension overnight requiring midodrine . CCM consulted due to need for possible pressor support. GOC discussion between patient and family to take place.  Glucose / Insulin : no history of DM Insulin  requirements the last 24h: 7 > 12 > 3 > 14 > 11 > 3 BG lower today, 99-106 Electrolytes:  K 5.0>3.5>4.1>2.8>4.0>4.3 Mg 1.8 (7/24) Renal: Scr 2.49>2.61>2.49>3.02>3.44 Baseline 0.6-0.8 Phos 3.7 (7/17) > 5.0 > 5.8 (7/21) > 5.8 (7/24) Hepatic: AST 70, ALT 19, Tbili 1.2 TG 356 (7/14) > 184 > 202 (7/21) Intake / Output: History of heart failure Sodium bicarb 150 mEq drip discontinued due to 10 kg weight gain overnight ISO fluid overload  GI Imaging: US  renal 7/17: Mild left hydronephrosis, unchanged from 7/9  CT abdomen/pelvis 7/11:  1. Interval marked fluid distension of the stomach with irregular wall thickening of the antral pyloric region with mucosal enhancement with otherwise decompressed appearance of the bowel, outlet obstruction or functional obstruction of the stomach cannot be excluded. NG decompression is recommended 2. Slight increased ascites within the abdomen and pelvis, now small-moderate volume. No free air. 3. New  mild left-sided hydronephrosis and hydroureter, with more normal appearing caliber of distal left ureter in the pelvis, anterior to the iliac vessels. No obstructing stone. Findings are consistent with distal ureteral obstruction. Decreased excretion of contrast from the left kidney on delayed views. 4. Nodular soft tissue thickening at the right posterior bladder, stable in the short interim but suspicious for neoplasm/metastatic disease. 5. Extensive abdominal, retroperitoneal and pelvic adenopathy consistent with metastatic disease. 6. 6 mm right lower lobe pulmonary nodule, stable, and also concerning for metastatic disease. 7. Aortic atherosclerosis.  GI Surgeries / Procedures:  EGD done 7/10 7/14: PERCUTANEOUS (VENTING) G-TUBE PLACEMENT (3600 ml of fluid removed)  Central access: 7/11 TPN start date: 7/11  Nutritional Goals: Goal TPN rate 55 mL/hr (provides 85.8 g of protein and 1835 kcals per day)  RD Assessment: Consulted. Per discussion with RD, patient is third spacing and we want to be volume-sparing when possible with additional fluids. Total Energy Estimated Needs: 2000-2300kcal/day Total Protein Estimated Needs: 100-115g/day (will target 85g due to rising BUN after discussion with nephrology and RD) Total Fluid Estimated Needs: 1.4-1.6L/day  Current Nutrition: NPO Pt has tube but only for venting stomach, not for med administration  Plan:  Continue TPN at new rate of 55 mL/hr  Electrolytes in TPN: Na 30 mEq/L, K 30 mEq/L (decreased 7/25), Ca 28mEq/L, Mg 53mEq/L, and Phos 3 mmol/L (decreased 7/21) Max acetate to help with worsening acidosis. Add standard MVI and trace elements to TPN. Remove insulin  10 units from TPN but continue Resistant q6h SSI Monitor TPN labs on Mon/Thurs   Will M. Lenon, PharmD Clinical Pharmacist 03/16/2024 11:09 AM

## 2024-03-16 NOTE — Progress Notes (Signed)
 Palliative Care Progress Note, Assessment & Plan   Patient Name: Tonya [name]       Date: 03/16/2024 DOB: [birth year]  Age: 58 y.o. MRN#: [id number] Attending Physician: Marsa Edelman, DO Primary Care Physician: Cleotilde Oneil FALCON, MD Admit Date: 03/09/2024  Subjective: Patient is lying in bed, sleeping, but easily awakens to my presence.  Once awake, she is alert and oriented x 4.  Her husband is at bedside during her visit.  HPI: 58 y.o. female  with past medical history of  stage IV metastatic endometrial cancer status postchemotherapy, SIADH, hypothyroidism, HTN, CAD, gastric reflux, chronic HFpEF, and recent admission to [facility] (7/9 - 7/17) with tractable nausea with vomiting with gastric outlet obstruction s/p PEG tube placement (discharged home with PICC line, TPN, and PEG tube in place) admitted on 03/09/2024 with generalized weakness and fall.    Patient is being treated for generalized weakness and deconditioning, hypokalemia, metastatic endometrial cancer with gastric outlet obstruction s/p PEG tube, malnutrition, AKI, and hypothyroidism.   PMT was consulted to support patient with goals of care discussions.   Of note, patient is familiar to PMT/this provider as I met with patient during her previous hospitalization.  Summary of counseling/coordination of care: Extensive chart review completed prior to meeting patient including labs, vital signs, imaging, progress notes, orders, and available advanced directive documents from current and previous encounters.   After reviewing the patient's chart and assessing the patient at bedside, I spoke with patient in regards to symptom management and goals of care.   I attempted to speak with patient and husband in regards to her current condition and goals  of care.  I expressed my concern for the severity of her illness.  She quickly asked that I return next Monday.  I shared that I am off service until Tuesday of next week. She deferred discussion of any type today and requested my colleague Josh follow-up for further discussion at a late date/time.  I attempted to speak with patient's husband to offer support and answers questions concerning patient's current condition.  He shares that he has no concerns or questions at this time.  During previous discussions, patient shared she was awaiting husband's arrival at bedside to discuss condition as well as talk about financial plans and access to things when she has passed.  However, today she avoided any discussion of her current medical status or plans for husband to access things in the future.  She shares she wishes to talk at a later time and for her to be able to sleep at this time.  Patient in agreement with my colleague Josh to follow-up when able/appropriate.  Above conveyed to attending.  No adjustment to plan of care at this time.  Physical Exam Vitals reviewed.  Constitutional:      General: She is not in acute distress.    Appearance: She is obese.  HENT:     Nose: Nose normal.     Mouth/Throat:     Mouth: Mucous membranes are moist.  Eyes:     Pupils: Pupils are equal, round, and reactive to light.  Pulmonary:     Effort: Pulmonary effort is normal.  Abdominal:  Palpations: Abdomen is soft.  Skin:    General: Skin is warm and dry.     Coloration: Skin is pale.  Neurological:     Mental Status: She is alert. She is disoriented.  Psychiatric:        Mood and Affect: Mood normal.        Behavior: Behavior normal.        Judgment: Judgment normal.             Total Time 25 minutes   Time spent includes: Detailed review of medical records (labs, imaging, vital signs), medically appropriate exam (mental status, respiratory, cardiac, skin), discussed with treatment team,  counseling and educating patient, family and staff, documenting clinical information, medication management and coordination of care.  Lamarr L. Arvid, DNP, FNP-BC Palliative Medicine Team

## 2024-03-16 NOTE — Progress Notes (Signed)
 NAME:  Tonya [name], MRN:  [id number], DOB:  [birth year], LOS: 6 ADMISSION DATE:  03/09/2024, CHIEF COMPLAINT:  Hypotension   History of Present Illness:  Patient is a pleasant 58 year old female with a history of metastatic endometrial cancer with enuroendocrine features who is admitted for hypotension and a fall. PCCM is consulted regarding hypoxia and hypotension.   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  She was recently admitted to the hospital on 02/29/2024 for complaint of nausea and vomiting.  She was found to have a mass in the stomach on CT which was confirmed with EGD (biopsy of stomach mass showed poorly differentiated carcinoma with neuroendocrine features consistent with metastases).  Given gastric outlet obstruction, she had a PEG tube placed for decompression and was started on palliative TPN following which she was discharged home.  She unfortunately suffered a fall the night of discharge and was readmitted the following day to the hospital.   Patient was readmitted on 03/09/2024 with a fall.  On arrival, she was dehydrated with an acute kidney injury and multiple electrolyte abnormalities.  Management has been conservative this hospitalization with IV fluids and TPN.  Over the past 24 to 48 hours, her oxygen requirements have increased and she is noted to be hypotensive.  A CT scan of the abdomen was obtained which showed bilateral pleural effusions on the lung windows on 7/21.  PCCM was consulted for evaluation further management.   Chart reviewed and case discussed extensively with the patient's primary physician.  We have also involved oncology and palliative care in discussions.  It is my understanding that the patient has been unable to have conversations regarding goals of care and her overall status with her family members.    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 Alert and oriented x 3. Husband by the bedside.  Objective    Blood pressure (!) 71/44, pulse (!) 108, temperature 97.8 F (36.6 C), resp. rate 16, height 5' 4 (1.626 m), weight 108.6 kg, last menstrual period 07/22/2016, SpO2 96%.        Intake/Output Summary (Last 24 hours) at 03/16/2024 1338 Last data filed at 03/16/2024 9092 Gross per 24 hour  Intake --  Output 1975 ml  Net -1975 ml   Filed Weights   03/14/24 0500 03/15/24 0546 03/16/24 0500  Weight: 97.4 kg 97.1 kg 108.6 kg    Examination: Physical Exam Constitutional:      General: She is not in acute distress.    Appearance: She is ill-appearing.  Cardiovascular:     Rate and Rhythm: Normal rate and regular rhythm.     Pulses: Normal pulses.     Heart sounds: Normal heart sounds.  Pulmonary:     Effort: Pulmonary effort is normal.     Breath sounds: Normal breath sounds.  Neurological:     General: No focal deficit present.     Mental Status: She is alert and oriented to person, place, and time.     Motor: Weakness present.     Assessment and Plan   #Circulatory Shock  #Malnutrition #  Acute Hypoxic Respiratory Failure #Bilateral Pleural Effusions #Metastatic Stage IV Endometrial Carcinoma #AKI #Acute HFpEF  58 year old female with known metastatic endometrial cancer resulting in gastric outlet obstruction.  This required a PEG for decompression.  She is admitted after a fall and found to have AKI.  Course has been complicated by decompensated heart failure secondary to resuscitation with IV fluids.   Neuro - pain  control per primary team. She is able to make her own decisions but I am highly concerned of worsening uremic encephalopathy. We have been trying to encourage the patient to have the difficult conversation of breaking the news of her overall condition and poor prognosis to her family. Explained that in the event she looses capacity we would have to disclose her diagnosis of terminal malignancy. CV - decompensated HFpEF given IV fluids, AKI, and overall poor nutritional status resulting in decreased oncotic pressures. Remains hypotensive today and I have re-ordered 50 grams of albumin  again today. She is at high risk of further decompensation, and it is my opinion that vasopressor support would be futile given overall prognosis (see hem/onc) Pulmonary - respiratory failure is due to pulmonary edema and bilateral pleural effusions due to decompensated heart failure. Effusions are small to moderate, and sampling/drainage would not change her overall outcome, and would expose her to unnecessary risk. Oxygen via nasal cannula for now with goal SpO2 > 92%. 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 She has a PEG to vent the stomach. Overall poor prognosis and TPN is unlikely to change the outcome. Renal - worsening renal dysfunction and uremia. This failed to improve with IV fluids (sodium bicarb). She continues to make urine and post obstructive uropathy is unlikely. Her urine function is worse today. Endo - levothyroxine  for hypothyroidism. Hem/Onc - Stage IV endometrial ca with gastric outlet obstruction. She unfortunately has metastatic malignancy and is not a candidate for further therapies. She is malnourished with third spacing and absence of oncotic pressure to maintain her intravascular space. This is unfortunately end stage and the patient is in the dying process. She has resisted our recommendation to discuss these findings with her family, despite our recommendation that she has these  discussions with her husband, sister, and son. I am highly concerned that she would not survive this hospitalization and have encouraged her to continue to have these conversations with her family before her encephalopathy worsens. I have recommended to her that she not have a code blue called, with futility of intubation/mechanical ventilation as well as chest compressions. It is also my opinion that initiation of vasopressors would be futile. I would recommend consideration for hospice. ID - no sign of active infection at this point Dispo - DNR/DNI, would recommend against ICU transfer or initiation of vasopressors given poor prognosis and futility. After multiple discussions with the patient and her husband, she would like to wait until tomorrow when her sister gets here to inform them of the diagnosis and it's gravity. I am concerned that her family is not aware of the gravity of the situation and she might become encephalopathic and we would have to have this discussion with them.  Best Practice (right click and Reselect all SmartList Selections daily)   Diet/type: NPO DVT prophylaxis prophylactic heparin   Pressure ulcer(s): N/A GI prophylaxis: PPI Lines: Central line Foley:  Yes, and it is still needed Code Status:  DNR Last date of multidisciplinary goals of care discussion [03/16/2024]  Labs   CBC: Recent Labs  Lab 03/11/24 [phone]  03/12/24 0500 03/13/24 0441 03/14/24 0453 03/15/24 0839 03/16/24 0353  WBC 23.4* 23.8* 24.6* 25.5* 29.1* 26.9*  NEUTROABS 20.9* 21.6* 22.6* 23.7*  --   --   HGB 11.0* 10.4* 11.1* 10.4* 9.8* 9.4*  HCT 32.6* 30.4* 33.8* 30.8* 29.7* 27.4*  MCV 98.8 97.4 99.7 97.5 98.7 97.2  PLT 184 176 179 152 118* 96*    Basic Metabolic Panel: Recent Labs  Lab 03/10/24 0512 03/11/24 0938 03/12/24 0500 03/13/24 0441 03/14/24 0453 03/14/24 2029 03/15/24 0743 03/16/24 0353  NA 136   < > 138 139 135  --  135 139  K 4.8   < > 3.5 4.1 2.8* 4.4 4.0 4.3  CL 102    < > 105 107 105  --  101 100  CO2 20*   < > 15* 16* 13*  --  15* 20*  GLUCOSE 91   < > 148* 114* 155*  --  156* 106*  BUN 124*   < > 111* 149* 150*  --  165* 179*  CREATININE 2.32*   < > 2.49* 2.61* 2.49*  --  3.02* 3.44*  CALCIUM  8.4*   < > 8.4* 8.5* 8.3*  --  8.2* 8.9  MG  --   --  1.9  --   --   --  1.8  --   PHOS 5.0*  --  5.8*  --  5.3*  --  5.8*  --    < > = values in this interval not displayed.   GFR: Estimated Creatinine Clearance: 21.2 mL/min (A) (by C-G formula based on SCr of 3.44 mg/dL (H)). Recent Labs  Lab 03/13/24 0441 03/14/24 0453 03/15/24 0839 03/16/24 0353  WBC 24.6* 25.5* 29.1* 26.9*    Liver Function Tests: Recent Labs  Lab 03/10/24 0512 03/15/24 2216 03/16/24 0353  AST 50*  --  70*  ALT 10  --  19  ALKPHOS 90  --  231*  BILITOT 0.7  --  1.2  PROT 5.5*  --  5.0*  ALBUMIN  2.0* 2.4* 2.0*   No results for input(s): LIPASE, AMYLASE in the last 168 hours. No results for input(s): AMMONIA in the last 168 hours.  ABG    Component Value Date/Time   HCO3 18.9 (L) 03/16/2024 0353   ACIDBASEDEF 6.1 (H) 03/16/2024 0353   O2SAT 45.6 03/16/2024 0353     Coagulation Profile: Recent Labs  Lab 03/10/24 0512  INR 1.0    Cardiac Enzymes: No results for input(s): CKTOTAL, CKMB, CKMBINDEX, TROPONINI in the last 168 hours.  HbA1C: Hgb A1c MFr Bld  Date/Time Value Ref Range Status  10/03/2023 06:33 AM 6.5 (H) 4.8 - 5.6 % Final    Comment:    (NOTE) Pre diabetes:          5.7%-6.4%  Diabetes:              >6.4%  Glycemic control for   <7.0% adults with diabetes   07/23/2019 08:57 AM 7.7 (H) 4.8 - 5.6 % Final    Comment:    (NOTE) Pre diabetes:          5.7%-6.4% Diabetes:              >6.4% Glycemic control for   <7.0% adults with diabetes     CBG: Recent Labs  Lab 03/15/24 2026 03/15/24 2358 03/16/24 0502 03/16/24 0735 03/16/24 1123  GLUCAP 104* 127* 100* 99 132*    Review of Systems:   N/A  Past Medical History:  She,  has a past medical history of Cancer [facility]), Coronary artery disease, GERD (gastroesophageal reflux disease), Hypertension, Hypothyroidism, Myocardial infarction (HCC), and Thyroid  disease.   Surgical History:   Past Surgical History:  Procedure Laterality Date   CARDIAC CATHETERIZATION     CESAREAN SECTION     CORONARY STENT INTERVENTION N/A 07/24/2019   Procedure: CORONARY STENT INTERVENTION;  Surgeon: Mady Bruckner, MD;  Location: [facility] INVASIVE CV LAB;  Service: Cardiovascular;  Laterality: N/A;  RCA & CFX   ESOPHAGOGASTRODUODENOSCOPY N/A 03/01/2024   Procedure: EGD (ESOPHAGOGASTRODUODENOSCOPY);  Surgeon: Jinny Carmine, MD;  Location: [facility] ENDOSCOPY;  Service: Endoscopy;  Laterality: N/A;   IR GASTROSTOMY TUBE MOD SED  03/05/2024   IR IMAGING GUIDED PORT INSERTION  09/23/2023   IR PARACENTESIS  03/05/2024   LEFT HEART CATH AND CORONARY ANGIOGRAPHY N/A 07/24/2019   Procedure: LEFT HEART CATH AND CORONARY ANGIOGRAPHY;  Surgeon: Hester Wolm PARAS, MD;  Location: [facility] INVASIVE CV LAB;  Service: Cardiovascular;  Laterality: N/A;   TONSILLECTOMY       Social History:   reports that she has never smoked. She has never used smokeless tobacco. She reports that she does not currently use alcohol. She reports that she does not use drugs.   Family History:  Her family history includes Atrial fibrillation in her mother; Colon cancer in her mother; Heart Problems in her father; Heart attack in her father; Heart disease in her father.   Allergies No Known Allergies   Home Medications  Prior to Admission medications   Medication Sig Start Date End Date Taking? Authorizing Provider  bisacodyl  (DULCOLAX) 10 MG suppository Place 1 suppository (10 mg total) rectally daily as needed for moderate constipation. 03/08/24  Yes Caleen Qualia, MD  levothyroxine  (SYNTHROID , LEVOTHROID) 100 MCG/5ML SOLN injection Inject 10 mLs (200 mcg total) into the vein in the morning. 03/09/24  Yes Caleen Qualia, MD   ondansetron  (ZOFRAN ) 4 MG/2ML SOLN injection Inject 2 mLs (4 mg total) into the vein every 6 (six) hours as needed for nausea or vomiting. 03/08/24  Yes Caleen Qualia, MD  pantoprazole  (PROTONIX ) 40 MG injection Inject 40 mg into the vein daily. 03/08/24  Yes Caleen Qualia, MD     The patient is critically ill due to end stage malignancy.  Critical care was necessary to treat or prevent imminent or life-threatening deterioration. Critical care time was spent by me on the following activities: development of a treatment plan with the patient and/or surrogate as well as nursing, discussions with consultants, evaluation of the patient's response to treatment, examination of the patient, obtaining a history from the patient or surrogate, ordering and performing treatments and interventions, ordering and review of laboratory studies, ordering and review of radiographic studies, review of telemetry data including pulse oximetry, re-evaluation of patient's condition and participation in multidisciplinary rounds.   I personally spent 48 minutes providing critical care not including any separately billable procedures.   Belva November, MD [facility] 03/16/2024 1:59 PM

## 2024-03-16 NOTE — Plan of Care (Signed)
   Problem: Education: Goal: Knowledge of General Education information will improve Description Including pain rating scale, medication(s)/side effects and non-pharmacologic comfort measures Outcome: Progressing

## 2024-03-17 DIAGNOSIS — E860 Dehydration: Secondary | ICD-10-CM | POA: Diagnosis not present

## 2024-03-17 LAB — CBC
HCT: 29 % — ABNORMAL LOW (ref 36.0–46.0)
Hemoglobin: 10.2 g/dL — ABNORMAL LOW (ref 12.0–15.0)
MCH: 37 pg — ABNORMAL HIGH (ref 26.0–34.0)
MCHC: 35.2 g/dL (ref 30.0–36.0)
MCV: 105.1 fL — ABNORMAL HIGH (ref 80.0–100.0)
Platelets: 72 K/uL — ABNORMAL LOW (ref 150–400)
RBC: 2.76 MIL/uL — ABNORMAL LOW (ref 3.87–5.11)
RDW: 19.4 % — ABNORMAL HIGH (ref 11.5–15.5)
WBC: 19.3 K/uL — ABNORMAL HIGH (ref 4.0–10.5)
nRBC: 0.2 % (ref 0.0–0.2)

## 2024-03-17 LAB — GLUCOSE, CAPILLARY
Glucose-Capillary: 119 mg/dL — ABNORMAL HIGH (ref 70–99)
Glucose-Capillary: 150 mg/dL — ABNORMAL HIGH (ref 70–99)
Glucose-Capillary: 153 mg/dL — ABNORMAL HIGH (ref 70–99)

## 2024-03-17 LAB — BLOOD GAS, VENOUS
Acid-base deficit: 6.1 mmol/L — ABNORMAL HIGH (ref 0.0–2.0)
Bicarbonate: 18.9 mmol/L — ABNORMAL LOW (ref 20.0–28.0)
O2 Saturation: 45.6 %
Patient temperature: 37
pCO2, Ven: 35 mmHg — ABNORMAL LOW (ref 44–60)
pH, Ven: 7.34 (ref 7.25–7.43)

## 2024-03-17 LAB — COMPREHENSIVE METABOLIC PANEL WITH GFR
ALT: 21 U/L (ref 0–44)
AST: 69 U/L — ABNORMAL HIGH (ref 15–41)
Albumin: 2.4 g/dL — ABNORMAL LOW (ref 3.5–5.0)
Alkaline Phosphatase: 341 U/L — ABNORMAL HIGH (ref 38–126)
Anion gap: 21 — ABNORMAL HIGH (ref 5–15)
BUN: 201 mg/dL — ABNORMAL HIGH (ref 6–20)
CO2: 18 mmol/L — ABNORMAL LOW (ref 22–32)
Calcium: 9.2 mg/dL (ref 8.9–10.3)
Chloride: 98 mmol/L (ref 98–111)
Creatinine, Ser: 3.91 mg/dL — ABNORMAL HIGH (ref 0.44–1.00)
GFR, Estimated: 13 mL/min — ABNORMAL LOW (ref >60)
Glucose, Bld: 134 mg/dL — ABNORMAL HIGH (ref 70–99)
Potassium: 4.7 mmol/L (ref 3.5–5.1)
Sodium: 137 mmol/L (ref 135–145)
Total Bilirubin: 1.9 mg/dL — ABNORMAL HIGH (ref 0.0–1.2)
Total Protein: 5.1 g/dL — ABNORMAL LOW (ref 6.5–8.1)

## 2024-03-17 LAB — BPAM RBC
Blood Product Expiration Date: 202508212359
ISSUE DATE / TIME: 202507252109
Unit Type and Rh: 6200

## 2024-03-17 LAB — TYPE AND SCREEN
ABO/RH(D): A POS
Antibody Screen: NEGATIVE
Unit division: 0

## 2024-03-17 MED ORDER — BIOTENE DRY MOUTH MT LIQD: 15.0000 mL | OROMUCOSAL | Status: DC | PRN

## 2024-03-17 MED ORDER — TRACE MINERALS CU-MN-SE-ZN 300-55-60-3000 MCG/ML IV SOLN
INTRAVENOUS | Status: DC
  Filled 2024-03-17: qty 572

## 2024-03-17 MED ORDER — POLYVINYL ALCOHOL 1.4 % OP SOLN
1.0000 [drp] | Freq: Four times a day (QID) | OPHTHALMIC | Status: DC | PRN
  Filled 2024-03-17: qty 15

## 2024-03-17 MED ORDER — SODIUM CHLORIDE 0.9 % IV SOLN
12.5000 mg | Freq: Four times a day (QID) | INTRAVENOUS | Status: DC | PRN
  Filled 2024-03-17: qty 0.5

## 2024-03-17 MED ORDER — MORPHINE SULFATE (PF) 2 MG/ML IV SOLN
1.0000 mg | INTRAVENOUS | Status: DC | PRN
  Administered 2024-03-17 – 2024-03-18 (×3): 2 mg via INTRAVENOUS
  Administered 2024-03-18: 4 mg via INTRAVENOUS
  Filled 2024-03-17 (×3): qty 1
  Filled 2024-03-17: qty 2

## 2024-03-17 MED ORDER — LORAZEPAM 2 MG/ML IJ SOLN: 1.0000 mg | INTRAMUSCULAR | Status: DC | PRN

## 2024-03-17 MED ORDER — GLYCOPYRROLATE 0.2 MG/ML IJ SOLN: 0.2000 mg | INTRAMUSCULAR | Status: DC | PRN

## 2024-03-17 MED ORDER — DIPHENHYDRAMINE HCL 50 MG/ML IJ SOLN: 12.5000 mg | INTRAMUSCULAR | Status: DC | PRN

## 2024-03-17 NOTE — Progress Notes (Signed)
 Pt placed on Comfort Measures by Dr Marsa after speaking with the patient and her family at the bedside

## 2024-03-17 NOTE — IPAL (Signed)
  Interdisciplinary Goals of Care Family Meeting   Date carried out: 03/17/2024  Location of the meeting: Bedside  Member's involved: Physician, Family Member or next of kin, and Other: patient. Family includes patient's husband, patient's sister, patient's brother   Durable Power of Attorney or Environmental health practitioner: patient is alert at this time     Discussion: We discussed goals of care for [name] .  I reviewed the complexities of her situation, the progressive multiorgan failure and my concern for internal/GI bleeding that we cannot fix. Patient at this time is uncomfortable but not suffering. She would like to wait until her son arrives later today and at that point likely to consider for transition to hospice/comfort measures   Code status:   Code Status: Limited: Do not attempt resuscitation (DNR) -DNR-LIMITED -Do Not Intubate/DNI    Disposition: Continue current acute care but likely for transition to hospice/EOL care later today   Time spent for the meeting: 30 min      Laneta Blunt, DO  03/17/2024, 12:35 PM

## 2024-03-17 NOTE — Progress Notes (Signed)
 MEWS Progress Note  Patient Details Name: Blayke Pinera MRN: [id number] DOB: 27-Feb-1966 Today's Date: 03/17/2024   MEWS Flowsheet Documentation:  Assess: MEWS Score Temp: 97.6 F (36.4 C) BP: (!) 75/53 MAP (mmHg): (!) 61 Pulse Rate: (!) 111 ECG Heart Rate: 95 Resp: (!) 30 Level of Consciousness: Alert SpO2: 98 % O2 Device: Room Air Patient Activity (if Appropriate): In bed Assess: MEWS Score MEWS Temp: 0 MEWS Systolic: 2 MEWS Pulse: 2 MEWS RR: 2 MEWS LOC: 0 MEWS Score: 6 MEWS Score Color: Red Assess: SIRS CRITERIA SIRS Temperature : 0 SIRS Respirations : 1 SIRS Pulse: 1 SIRS WBC: 0 SIRS Score Sum : 2 Assess: if the MEWS score is Yellow or Red Were vital signs accurate and taken at a resting state?: Yes Does the patient meet 2 or more of the SIRS criteria?: Yes Does the patient have a confirmed or suspected source of infection?: No MEWS guidelines implemented : Yes, red Treat MEWS Interventions: Considered administering scheduled or prn medications/treatments as ordered Take Vital Signs Increase Vital Sign Frequency : Red: Q1hr x2, continue Q4hrs until patient remains green for 12hrs Escalate MEWS: Escalate: Red: Discuss with charge nurse and notify provider. Consider notifying RRT. If remains red for 2 hours consider need for higher level of care   MD aware of pt's vital signs; family meeting with pt and attending physician pending arrival of the pt's husband and son; to notify Dr Marsa when they arrive     Catheryn Macario Ned 03/17/2024, 3:36 PM

## 2024-03-17 NOTE — Consult Note (Signed)
 PHARMACY - TOTAL PARENTERAL NUTRITION CONSULT NOTE   Indication: Small bowel obstruction  Patient Measurements: Height: 5' 4 (162.6 cm) Weight: 108.3 kg (238 lb 12.1 oz) IBW/kg (Calculated) : 54.7 TPN AdjBW (KG): 62.3 Body mass index is 40.98 kg/m. Usual Weight: N/A  Assessment:  58 y.o. 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 Patient discharged yesterday with plans to follow up with oncology, with new chemotherapy regimen set to initiate 7/22. Patient now back today after weakness with fall at home, now with AKI.  Update 7/22: AKI continues to worsen. Nephrology consulted. Update 7/24: Hypotension overnight requiring midodrine . CCM consulted due to need for possible pressor support. GOC discussion between patient and family to take place.  Glucose / Insulin : no history of DM Insulin  requirements the last 24h: 7 units -On resistant SSI BG lower today, 107-153 Electrolytes:  K 5.0>3.5>4.1>2.8>4.0>4.3 Mg 1.8 (7/24) Renal: Scr 2.49>2.61>2.49>3.02>3.44 Baseline 0.6-0.8 Phos 3.7 (7/17) > 5.0 > 5.8 (7/21) > 5.8 (7/24) Hepatic: AST 70, ALT 19, Tbili 1.2 TG 356 (7/14) > 184 > 202 (7/21) Intake / Output: History of heart failure Sodium bicarb 150 mEq drip discontinued due to 10 kg weight gain overnight ISO fluid overload  GI Imaging: US  renal 7/17: Mild left hydronephrosis, unchanged from 7/9  CT abdomen/pelvis 7/11:  1. Interval marked fluid distension of the stomach with irregular wall thickening of the antral pyloric region with mucosal enhancement with otherwise decompressed appearance of the bowel, outlet obstruction or functional obstruction of the stomach cannot be excluded. NG decompression is recommended 2. Slight increased ascites within the abdomen and pelvis, now small-moderate volume. No free air. 3.  New mild left-sided hydronephrosis and hydroureter, with more normal appearing caliber of distal left ureter in the pelvis, anterior to the iliac vessels. No obstructing stone. Findings are consistent with distal ureteral obstruction. Decreased excretion of contrast from the left kidney on delayed views. 4. Nodular soft tissue thickening at the right posterior bladder, stable in the short interim but suspicious for neoplasm/metastatic disease. 5. Extensive abdominal, retroperitoneal and pelvic adenopathy consistent with metastatic disease. 6. 6 mm right lower lobe pulmonary nodule, stable, and also concerning for metastatic disease. 7. Aortic atherosclerosis.  GI Surgeries / Procedures:  EGD done 7/10 7/14: PERCUTANEOUS (VENTING) G-TUBE PLACEMENT (3600 ml of fluid removed)  Central access: 7/11 TPN start date: 7/11  Nutritional Goals: Goal TPN rate 55 mL/hr (provides 85.8 g of protein and 1835 kcals per day)  RD Assessment: Consulted. Per discussion with RD, patient is third spacing and we want to be volume-sparing when possible with additional fluids. Total Energy Estimated Needs: 2000-2300kcal/day Total Protein Estimated Needs: 100-115g/day (will target 85g due to rising BUN after discussion with nephrology and RD) Total Fluid Estimated Needs: 1.4-1.6L/day  Current Nutrition: NPO Pt has tube but only for venting stomach, not for med administration  Plan:  Continue TPN at 55 mL/hr  Electrolytes in TPN: Na 30 mEq/L, K 20 mEq/L (decreased 7/25, 7/26), Ca 94mEq/L, Mg 78mEq/L, and Phos 3 mmol/L (decreased 7/21, 7/25) Max acetate to help with worsening acidosis. Add standard MVI and trace elements to TPN. Resistant q6h SSI Monitor TPN labs on Mon/Thurs, daily if unstable   Allean Haas PharmD Clinical Pharmacist 03/17/2024

## 2024-03-17 NOTE — Plan of Care (Signed)
  Problem: Elimination: Goal: Will not experience complications related to urinary retention Outcome: Progressing   Problem: Pain Managment: Goal: General experience of comfort will improve and/or be controlled Outcome: Progressing   Problem: Safety: Goal: Ability to remain free from injury will improve Outcome: Progressing   Problem: Nutrition: Goal: Adequate nutrition will be maintained Outcome: Not Progressing  TPN infusing as ordered in [facility] Problem: Elimination: Goal: Will not experience complications related to bowel motility Outcome: Not Progressing  Pt incontinent of bloody stool that is irritating to her buttocks

## 2024-03-17 NOTE — Plan of Care (Signed)
   Problem: Health Behavior/Discharge Planning: Goal: Ability to manage health-related needs will improve Outcome: Progressing   Problem: Clinical Measurements: Goal: Ability to maintain clinical measurements within normal limits will improve Outcome: Progressing Goal: Will remain free from infection Outcome: Progressing Goal: Diagnostic test results will improve Outcome: Progressing

## 2024-03-17 NOTE — Progress Notes (Signed)
 PROGRESS NOTE    Tonya [name]   FMW:[id number] DOB: 07-Aug-1966  DOA: 03/09/2024 Date of Service: 03/17/24 which is hospital day 7  PCP: Cleotilde Oneil FALCON, MD    Hospital course / significant events:   HPI: Tonya [name] is a pleasant 58 y.o. female with medical history significant for metastatic stage IV endometrial cancer, CAD, gastric reflux, hypertension, gastric outlet obstruction s/p PEG tube who was recently admitted to [facility] and discharged between 02/29/2024 and 03/08/2024 after being treated for intractable nausea vomiting due to gastric mass s/p PEG tube.  Patient was discharged home on PICC line, TPN and PEG tube. Weakness since that discharge and presented again to ED.   07/18: to ED. Dx hyperkalemia 5.6, severe dehydration/AKI with BUN 117 and creatinine 2.33 and leukocytosis at 24.3 though leukocytosis appeared chronic/stable previous admission  07/19 - 07/21: fluctuating lytes, continue TPN 07/22: leak at PEG tube site today, IR following. Renal fxn worse Cr 2.6.  07/23: Cr to 2.5, low K, high Phos, stable WBC. Recs for STR/SNF. Placement will be difficult w/ TPN, TOC following  07/24: hypotensive overnight, started midodrine , gave bolus fluids, in AM still soft BP. ICU consulted - giving albumin , holding on thoracentesis given non-severe effusion. Onc consulted - not candidate for further tx. Code status updated to DNR/DNI, pt has asked us  not to discuss her condition w/ family right now.  07/25: Renal function continues to worsen, albumin  still low, BP low, Plt lower today, multiple conversations with patient about seriousness of her situation / deteriorating. She is not willing to let us  give details to her family - Dr Isadora and I spoke with her together this afternoon with husband present, she insists on waiting until her sister arrives hopefully tomorrow. See below re GOC discussion 07/26: family at bedside today, including pt's husband, pt's son, pt's siblings.  Decision made for comfort measures.      Consultants:  Nephrology  Palliative Care  [facility] Oncology   Procedures/Surgeries: none       ASSESSMENT & PLAN:   COMFORT MEASURES ONLY STATUS  D/C labs, routine vitals, O2, TPN, non-comfort medications For now morphine /ativan  prn. If not controlling pain/air hunger can transition to morphine  drip, will leave IV access in place  Given evolving hypovolemic/cardiogenic shock, anticipate will not be stable for transport outside the hospital to hospice. Anticipate hospital death in next hours or much less likely days.      OTHER PERTINENT HOSPITAL PROBLEMS: discontinuing active treatment not focused on comfort measures  Acute kidney injury secondary to volume depletion Elevated and worsening BUN and Cr Metastatic endometrial cancer with gastric outlet obstruction s/p G tube for venting Abdominal ascites 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 Hypotension HFpEF SIRS Elevated HR likely compensating for low BP  Elevated RR likely compensating for metabolic acidosis Leukocytosis likely related to cancer  Worsening Thrombocytopenia likely related to inflammatory state  Elevated Alk Phos likely related to inflammatory state  Pleural effusion Leukocytosis with no signs of infection Hyperkalemia - resolved Hypokalemia G tube care Malnutrition due to unable to eat due to gastric outlet Hypothyroidism HFpEF GERD Generalized weakness/deconditioning Class 2 obesity based on BMI: Body mass index is 36.86 kg/m.SABRA            Subjective / Brief ROS:  Patient reports tired, exhausted She is frustrated she is not able to sleep better She is amenable to stopping aggressive treatments today    Family Communication: discussed w/ pt's husband and pt's siblings this morning and w/  them and pt's son this afternoon. Family at bedside - see IPAL note     Objective Findings:  Vitals:   03/17/24 0500 03/17/24 0822 03/17/24 1348 03/17/24  1517  BP:  (!) 84/62 (!) 74/54 (!) 75/53  Pulse:  (!) 109 (!) 111 (!) 111  Resp:  18 (!) 32 (!) 30  Temp:  97.9 F (36.6 C) (!) 97.3 F (36.3 C) 97.6 F (36.4 C)  TempSrc:      SpO2:  97% 96% 98%  Weight: 108.3 kg     Height:        Intake/Output Summary (Last 24 hours) at 03/17/2024 1629 Last data filed at 03/17/2024 1200 Gross per 24 hour  Intake 1263.29 ml  Output --  Net 1263.29 ml   Filed Weights   03/15/24 0546 03/16/24 0500 03/17/24 0500  Weight: 97.1 kg 108.6 kg 108.3 kg    Examination:  Physical Exam Constitutional:      General: She is not in acute distress.    Appearance: She is ill-appearing.  Cardiovascular:     Rate and Rhythm: Regular rhythm. Tachycardia present.     Heart sounds: Normal heart sounds.  Pulmonary:     Effort: No respiratory distress (increased WOB but seems better from yesterday).     Breath sounds: Rales present. No wheezing or rhonchi.     Comments: Increased WOB, she states this comes and goes  Abdominal:     General: There is distension.     Palpations: Abdomen is soft.     Tenderness: There is no abdominal tenderness. There is no guarding.  Skin:    General: Skin is dry.     Coloration: Skin is pale.  Neurological:     General: No focal deficit present.     Mental Status: She is alert and oriented to person, place, and time.  Psychiatric:        Mood and Affect: Mood normal.        Behavior: Behavior normal.          Scheduled Medications:   Chlorhexidine  Gluconate Cloth  6 each Topical Daily   pantoprazole   40 mg Intravenous Daily   simethicone   40 mg Per Tube BID    Continuous Infusions:  chlorproMAZINE  (THORAZINE ) 12.5 mg in sodium chloride  0.9 % 25 mL IVPB      PRN Medications:  antiseptic oral rinse, artificial tears, bisacodyl , chlorproMAZINE  (THORAZINE ) 12.5 mg in sodium chloride  0.9 % 25 mL IVPB, diphenhydrAMINE , glycopyrrolate , LORazepam , methocarbamol  (ROBAXIN ) injection, morphine  injection,  [DISCONTINUED] ondansetron  **OR** ondansetron  (ZOFRAN ) IV  Antimicrobials from admission:  Anti-infectives (From admission, onward)    None           Data Reviewed:  I have personally reviewed the following...  CBC: Recent Labs  Lab 03/11/24 0938 03/12/24 0500 03/13/24 0441 03/14/24 0453 03/15/24 0839 03/16/24 0353 03/16/24 1627 03/17/24 0807  WBC 23.4* 23.8* 24.6* 25.5* 29.1* 26.9* 23.3* 19.3*  NEUTROABS 20.9* 21.6* 22.6* 23.7*  --   --   --   --   HGB 11.0* 10.4* 11.1* 10.4* 9.8* 9.4* 8.5* 10.2*  HCT 32.6* 30.4* 33.8* 30.8* 29.7* 27.4* 24.5* 29.0*  MCV 98.8 97.4 99.7 97.5 98.7 97.2 96.5 105.1*  PLT 184 176 179 152 118* 96* 75* 72*   Basic Metabolic Panel: Recent Labs  Lab 03/12/24 0500 03/13/24 0441 03/14/24 0453 03/14/24 2029 03/15/24 0743 03/16/24 0353 03/17/24 0909  NA 138 139 135  --  135 139 137  K 3.5 4.1 2.8*  4.4 4.0 4.3 4.7  CL 105 107 105  --  101 100 98  CO2 15* 16* 13*  --  15* 20* 18*  GLUCOSE 148* 114* 155*  --  156* 106* 134*  BUN 111* 149* 150*  --  165* 179* 201*  CREATININE 2.49* 2.61* 2.49*  --  3.02* 3.44* 3.91*  CALCIUM  8.4* 8.5* 8.3*  --  8.2* 8.9 9.2  MG 1.9  --   --   --  1.8  --   --   PHOS 5.8*  --  5.3*  --  5.8*  --   --    GFR: Estimated Creatinine Clearance: 18.6 mL/min (A) (by C-G formula based on SCr of 3.91 mg/dL (H)). Liver Function Tests: Recent Labs  Lab 03/15/24 2216 03/16/24 0353 03/17/24 0909  AST  --  70* 69*  ALT  --  19 21  ALKPHOS  --  231* 341*  BILITOT  --  1.2 1.9*  PROT  --  5.0* 5.1*  ALBUMIN  2.4* 2.0* 2.4*   No results for input(s): LIPASE, AMYLASE in the last 168 hours. No results for input(s): AMMONIA in the last 168 hours. Coagulation Profile: No results for input(s): INR, PROTIME in the last 168 hours.  Cardiac Enzymes: No results for input(s): CKTOTAL, CKMB, CKMBINDEX, TROPONINI in the last 168 hours.  BNP (last 3 results) No results for input(s): PROBNP in the  last 8760 hours. HbA1C: No results for input(s): HGBA1C in the last 72 hours. CBG: Recent Labs  Lab 03/16/24 1612 03/16/24 1818 03/17/24 0003 03/17/24 0549 03/17/24 1221  GLUCAP 116* 107* 153* 119* 150*   Lipid Profile: No results for input(s): CHOL, HDL, LDLCALC, TRIG, CHOLHDL, LDLDIRECT in the last 72 hours.  Thyroid  Function Tests: No results for input(s): TSH, T4TOTAL, FREET4, T3FREE, THYROIDAB in the last 72 hours. Anemia Panel: No results for input(s): VITAMINB12, FOLATE, FERRITIN, TIBC, IRON, RETICCTPCT in the last 72 hours. Most Recent Urinalysis On File:     Component Value Date/Time   COLORURINE YELLOW (A) 03/09/2024 0948   APPEARANCEUR CLOUDY (A) 03/09/2024 0948   LABSPEC 1.016 03/09/2024 0948   PHURINE 5.0 03/09/2024 0948   GLUCOSEU NEGATIVE 03/09/2024 0948   HGBUR NEGATIVE 03/09/2024 0948   BILIRUBINUR NEGATIVE 03/09/2024 0948   KETONESUR NEGATIVE 03/09/2024 0948   PROTEINUR NEGATIVE 03/09/2024 0948   NITRITE NEGATIVE 03/09/2024 0948   LEUKOCYTESUR MODERATE (A) 03/09/2024 0948   Sepsis Labs: @LABRCNTIP (procalcitonin:4,lacticidven:4) Microbiology: No results found for this or any previous visit (from the past 240 hours).    Radiology Studies last 3 days: DG Chest Port 1 View Result Date: 03/15/2024 CLINICAL DATA:  Shortness of breath, weakness. Metastatic endometrial cancer. EXAM: PORTABLE CHEST 1 VIEW COMPARISON:  03/07/2024 FINDINGS: Power injectable right Port-A-Cath tip: Right atrium. Coronary and aortic atherosclerosis. Retrocardiac airspace opacity mild blunting of the left lateral costophrenic angle. Appearance favors left lower lobe atelectasis or pneumonia potentially in the setting of a pleural effusion. Mild fullness of the left hilum, mild bilateral hilar fullness, vascular versus mild hilar adenopathy. Bandlike collection of barium in the stomach. IMPRESSION: 1. Retrocardiac airspace opacity, favoring left lower  lobe atelectasis or pneumonia potentially in the setting of a pleural effusion. 2. Mild fullness of the left hilum, mild bilateral hilar fullness, vascular versus mild hilar adenopathy. 3. Coronary and aortic atherosclerosis. 4. Bandlike collection of barium in the stomach. Electronically Signed   By: Ryan Salvage M.D.   On: 03/15/2024 08:42       Time  spent: 50 min + additional 25 min this afternoon Advanced care planning: 30 min     Javar Eshbach, DO [facility] 03/17/2024, 4:29 PM    Dictation software may have been used to generate the above note. Typos may occur and escape review in typed/dictated notes. Please contact Dr Marsa directly for clarity if needed.  Staff may message me via secure chat in Epic  but this may not receive an immediate response,  please page me for urgent matters!  If 7PM-7AM, please contact night coverage [email or web address]

## 2024-03-17 NOTE — Progress Notes (Signed)
 Pt moved from Room 102 to Room 122a via hospital bed by staff due to pt previously being placed on Comfort Measures

## 2024-03-17 NOTE — Progress Notes (Signed)
 MEWS Progress Note  Patient Details Name: Leticia Coletta MRN: [id number] DOB: 1965/10/04 Today's Date: 03/17/2024   MEWS Flowsheet Documentation:  Assess: MEWS Score Temp: (!) 97.3 F (36.3 C) BP: (!) 74/54 MAP (mmHg): (!) 61 Pulse Rate: (!) 111 ECG Heart Rate: 95 Resp: (!) 32 Level of Consciousness: Alert SpO2: 96 % O2 Device: Room Air Patient Activity (if Appropriate): In bed Assess: MEWS Score MEWS Temp: 0 MEWS Systolic: 2 MEWS Pulse: 2 MEWS RR: 2 MEWS LOC: 0 MEWS Score: 6 MEWS Score Color: Red Assess: SIRS CRITERIA SIRS Temperature : 0 SIRS Respirations : 1 SIRS Pulse: 1 SIRS WBC: 0 SIRS Score Sum : 2 Assess: if the MEWS score is Yellow or Red Were vital signs accurate and taken at a resting state?: Yes Does the patient meet 2 or more of the SIRS criteria?: Yes Does the patient have a confirmed or suspected source of infection?: No MEWS guidelines implemented : Yes, red Treat MEWS Interventions: Considered administering scheduled or prn medications/treatments as ordered Take Vital Signs Increase Vital Sign Frequency : Red: Q1hr x2, continue Q4hrs until patient remains green for 12hrs Escalate MEWS: Escalate: Red: Discuss with charge nurse and notify provider. Consider notifying RRT. If remains red for 2 hours consider need for higher level of care Notify: Charge Nurse/RN Name of Charge Nurse/RN Notified: Loss adjuster, chartered Name/Title: N. Marsa DO Date Provider Notified: 03/17/24 Time Provider Notified: [id number] Method of Notification:  (secure chat) Notification Reason: Change in status (pt was a Yellow MEWS now a Red MEWS)  Advised attending physician of latest vital signs; Dr Marsa waiting on the arrival of the pt's son along with other family members to speak with them regarding pt's wishes due to prognosis    Catheryn Macario Ned 03/17/2024, 1:56 PM

## 2024-03-17 NOTE — Progress Notes (Signed)
 PT Cancellation Note  Patient Details Name: Tonya [name] MRN: [id number] DOB: 23-Mar-1966   Cancelled Treatment:    Reason Eval/Treat Not Completed: Other (comment). Per chart review, pt and family to have goals of care meeting about likely transition to comfort care or hospice. Will follow up with therapy intervention as appropriate.   Camie CHARLENA Kluver, PT, DPT 1:09 PM,03/17/24 Physical Therapist - [facility]

## 2024-03-17 NOTE — Progress Notes (Signed)
 RN Catheryn notified

## 2024-03-18 DIAGNOSIS — E860 Dehydration: Secondary | ICD-10-CM | POA: Diagnosis not present

## 2024-03-19 ENCOUNTER — Ambulatory Visit

## 2024-03-23 NOTE — Death Summary Note (Signed)
 DEATH SUMMARY   Patient Details  Name: Tonya [name] MRN: [id number] DOB: [birth year] ERE:Fpoozm, Oneil FALCON, MD Admission/Discharge Information   Admit Date:  2024/03/17  Date of Death: Date of Death: 2024-03-26  Time of Death: Time of Death: 1055  Length of Stay: 8   Principle Cause of death: metastatic  endometrial cancer causing likely internal bleeding and hypovolemic shock   Hospital Diagnoses: Principal Problem:   Dehydration Active Problems:   Gastric mass   Endometrial carcinoma (HCC)   Hypothyroidism   Ischemic cardiomyopathy   Muscular deconditioning   Acute hyperkalemia    Hospital course / significant events:   HPI: Tonya [name] is a pleasant 58 y.o. female with medical history significant for metastatic stage IV endometrial cancer, CAD, gastric reflux, hypertension, gastric outlet obstruction s/p PEG tube who was recently admitted to [facility] and discharged between 02/29/2024 and 03/08/2024 after being treated for intractable nausea vomiting due to gastric mass s/p PEG tube.  Patient was discharged home on PICC line, TPN and PEG tube. Weakness since that discharge and presented again to ED.   Mar 17, 2024: to ED. Dx hyperkalemia 5.6, severe dehydration/AKI with BUN 117 and creatinine 2.33 and leukocytosis at 24.3 though leukocytosis appeared chronic/stable previous admission  07/19 - 07/21: fluctuating lytes, continue TPN 07/22: leak at PEG tube site today, IR following. Renal fxn worse Cr 2.6.  07/23: Cr to 2.5, low K, high Phos, stable WBC. Recs for STR/SNF. Placement will be difficult w/ TPN, TOC following  07/24: hypotensive overnight, started midodrine , gave bolus fluids, in AM still soft BP. ICU consulted - giving albumin , holding on thoracentesis given non-severe effusion. Onc consulted - not candidate for further tx. Code status updated to DNR/DNI, pt has asked us  not to discuss her condition w/ family right now.  07/25: Renal function continues to worsen,  albumin  still low, BP low, Plt lower today, multiple conversations with patient about seriousness of her situation / deteriorating. She is not willing to let us  give details to her family - Dr Isadora and I spoke with her together this afternoon with husband present, she insists on waiting until her sister arrives hopefully tomorrow. See below re GOC discussion 07/26: family at bedside today, including pt's husband, pt's son, pt's siblings. Decision made for comfort measures.  03-26-24: passed peacefully this morning, family present      Consultants:  Nephrology  Palliative Care  PCCU Oncology   Procedures/Surgeries: none       ASSESSMENT & PLAN:   PERTINENT HOSPITAL PROBLEMS: discontinuing active treatment not focused on comfort measures  Acute kidney injury secondary to volume depletion Elevated and worsening BUN and Cr Metastatic endometrial cancer with gastric outlet obstruction s/p G tube for venting Abdominal ascites 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 Hypotension HFpEF SIRS Elevated HR likely compensating for low BP  Elevated RR likely compensating for metabolic acidosis Leukocytosis likely related to cancer  Worsening Thrombocytopenia likely related to inflammatory state  Elevated Alk Phos likely related to inflammatory state  Pleural effusion Leukocytosis with no signs of infection Hyperkalemia - resolved Hypokalemia G tube care Malnutrition due to unable to eat due to gastric outlet Hypothyroidism HFpEF GERD Generalized weakness/deconditioning Class 2 obesity based on BMI: Body mass index is 36.86 kg/m.SABRA Severe malnutrition        The results of significant diagnostics from this hospitalization (including imaging, microbiology, ancillary and laboratory) are listed below for reference.   Significant Diagnostic Studies: DG Chest Port 1 View Result Date: 03/15/2024 CLINICAL DATA:  Shortness of breath, weakness. Metastatic endometrial cancer. EXAM: PORTABLE  CHEST 1 VIEW COMPARISON:  03/07/2024 FINDINGS: Power injectable right Port-A-Cath tip: Right atrium. Coronary and aortic atherosclerosis. Retrocardiac airspace opacity mild blunting of the left lateral costophrenic angle. Appearance favors left lower lobe atelectasis or pneumonia potentially in the setting of a pleural effusion. Mild fullness of the left hilum, mild bilateral hilar fullness, vascular versus mild hilar adenopathy. Bandlike collection of barium in the stomach. IMPRESSION: 1. Retrocardiac airspace opacity, favoring left lower lobe atelectasis or pneumonia potentially in the setting of a pleural effusion. 2. Mild fullness of the left hilum, mild bilateral hilar fullness, vascular versus mild hilar adenopathy. 3. Coronary and aortic atherosclerosis. 4. Bandlike collection of barium in the stomach. Electronically Signed   By: Ryan Salvage M.D.   On: 03/15/2024 08:42   CT ABDOMEN PELVIS WO CONTRAST Result Date: 03/12/2024 EXAM: CT ABDOMEN AND PELVIS WITHOUT CONTRAST 03/12/2024 08:25:13 PM TECHNIQUE: CT of the abdomen and pelvis was performed without the administration of intravenous contrast. Multiplanar reformatted images are provided for review. Automated exposure control, iterative reconstruction, and/or weight based adjustment of the mA/kV was utilized to reduce the radiation dose to as low as reasonably achievable. COMPARISON: 02/29/2024 CLINICAL HISTORY: Abdominal pain, acute, nonlocalized. FINDINGS: LOWER CHEST: Moderate bilateral pleural effusions, left greater than right, new/increased. LIVER: Cirrhosis. GALLBLADDER AND BILE DUCTS: Vicarious excretion of contrast in the gallbladder. SPLEEN: No acute abnormality. PANCREAS: No acute abnormality. ADRENAL GLANDS: No acute abnormality. KIDNEYS, URETERS AND BLADDER: No stones in the kidneys or ureters. No hydronephrosis. No perinephric or periureteral stranding. Right posterolateral bladder wall thickening, suspicious for primary bladder  carcinoma versus serosal metastases. GI AND BOWEL: Suspected normal appendix. Percutaneous gastrostomy and satisfactory position, new. Loculated ascites along the posterior gastric body measuring 5.0 x 8.0 cm, new. Mild free air beneath the anterior abdominal wall, likely post-procedural. PERITONEUM AND RETROPERITONEUM: Moderate abdominal pelvic ascites, mildly progressive. Mild omental nodularity beneath the left anterior abdominal wall, suggesting peritoneal disease. VASCULATURE: Aorta is normal in caliber. LYMPH NODES: Upper abdominal and retroperitoneal lymphadenopathy, including a dominant 2.2 cm protocaval node. Associated bilateral pelvic lymphadenopathy, including a 2.3 cm short axis left external iliac node. REPRODUCTIVE ORGANS: Status post hysterectomy. BONES AND SOFT TISSUES: Body wall edema. IMPRESSION: 1. Interval percutaneous gastrostomy in satisfactory position. Mild free air, likely postprocedural. 2. Moderate abdominal pelvic ascites, mildly progressive. Associated mild omental nodularity beneath the left anterior abdominal wall, suggesting peritoneal disease. 3. Right posterolateral bladder wall thickening, suspicious for primary bladder carcinoma versus serosal metastases. 4. Stable abdominopelvic nodal metastases. 5. Moderate bilateral pleural effusions, left greater than right, new/increased. 6. Additional ancillary findings as above. Electronically signed by: Pinkie Pebbles MD 03/12/2024 08:35 PM EDT RP Workstation: [id number]   US  RENAL Result Date: 03/08/2024 CLINICAL DATA:  Acute kidney injury EXAM: RENAL / URINARY TRACT ULTRASOUND COMPLETE COMPARISON:  CT abdomen and pelvis dated 02/29/2024 FINDINGS: Right Kidney: Length = 11.9 cm AP renal pelvis diameter = <10 mm Normal parenchymal echogenicity with preserved corticomedullary differentiation. No urinary tract dilation or shadowing calculi. The ureter is not seen. Left Kidney: Length = 12.5 cm AP renal pelvis diameter = <10 mm Normal  parenchymal echogenicity with preserved corticomedullary differentiation. Mild urinary tract dilation, similar to 02/29/2024. The ureter is not seen. Bladder: Irregular soft tissue density along the posterior right bladder measures 1.6 x 1.4 x 0.9 cm, previously 3.5 x 1.6 cm. Other: None. IMPRESSION: 1. Similar mild left hydronephrosis. 2. Decreased size of irregular soft tissue density  along the posterior right bladder. Electronically Signed   By: Limin  Xu M.D.   On: 03/08/2024 16:10   DG Chest 2 View Result Date: 03/07/2024 CLINICAL DATA:  Leukocytosis EXAM: CHEST - 2 VIEW COMPARISON:  11/04/2023 FINDINGS: Right Port-A-Cath remains in place, unchanged. Heart and mediastinal contours are within normal limits. No focal opacities or effusions. No acute bony abnormality. IMPRESSION: No active cardiopulmonary disease. Electronically Signed   By: Franky Crease M.D.   On: 03/07/2024 11:21   IR GASTROSTOMY TUBE MOD SED Result Date: 03/05/2024 INDICATION: Venting gastrostomy. History of metastatic endometrial cancer with gastric outlet obstruction. EXAM: Procedures; 1. PERCUTANEOUS GASTROSTOMY TUBE PLACEMENT 2. ULTRASOUND-GUIDED DIAGNOSTIC AND THERAPEUTIC PARACENTESIS COMPARISON:  CT AP, 02/29/2024. MEDICATIONS: Ancef  2 gm IV; Antibiotics were administered within 1 hour of the procedure. CONTRAST:  20 mL mL of Isovue 300 administered into the gastric lumen. ANESTHESIA/SEDATION: Moderate (conscious) sedation was employed during this procedure. A total of Versed  2 mg and Fentanyl  100 mcg was administered intravenously. Moderate Sedation Time: 22 minutes. The patient's level of consciousness and vital signs were monitored continuously by radiology nursing throughout the procedure under my direct supervision. FLUOROSCOPY TIME:  Radiation Exposure Index and estimated peak skin dose (PSD); Reference air kerma (RAK), 8.1 mGy. COMPLICATIONS: None immediate. PROCEDURE: Informed written consent was obtained from the patient  and/or patient's representative following explanation of the procedure, risks, benefits and alternatives. A time out was performed prior to the initiation of the procedure. Maximal barrier sterile technique utilized including caps, mask, sterile gowns, sterile gloves, large sterile drape, hand hygiene and sterile prep. Initial ultrasound scanning demonstrates a moderate-to-large amount of ascites within the LEFT lower abdomen which was subsequently prepped and draped in the usual sterile fashion. 1% lidocaine  with epinephrine  was used for local anesthesia. An ultrasound image was saved for documentation purposed. An 8 Fr Safe-T-Centesis catheter was introduced. Paracentesis was performed. Attention was then directed to gastrostomy tube placement. The LEFT costal margin and barium opacified transverse colon were identified and avoided. Air was injected into the stomach for insufflation and visualization under fluoroscopy. Under sterile conditions and local anesthesia, 2 T tacks were utilized to pexy the anterior aspect of the stomach against the ventral abdominal wall. Contrast injection confirmed appropriate positioning of each of the T tacks. An incision was made between the T tacks and a 17 gauge trocar needle was utilized to access the stomach. Needle position was confirmed within the stomach with aspiration of air and injection of a small amount of contrast. A stiff guidewire was advanced into the gastric lumen and under intermittent fluoroscopic guidance, the access needle was exchanged for a telescoping peel-away sheath, ultimately allowing placement of a 18 Fr balloon retention gastrostomy tube. The retention balloon was insufflated with a mixture of dilute saline and contrast and pulled taut against the anterior wall of the stomach. The external disc was cinched. Contrast injection confirms positioning within the stomach. Several spot radiographic images were obtained in various obliquities for  documentation. The paracentesis catheter was removed and a dressing was applied. The patient tolerated the procedures well without immediate post procedural complication. The patient tolerated procedure well without immediate post procedural complication. FINDINGS: *After successful fluoroscopic guided placement, the gastrostomy tube is appropriately positioned with internal retention balloon against the ventral aspect of the gastric lumen. *A total of approximately 3.6 L of serous peritoneal fluid was removed. Samples were sent to the laboratory as requested by the clinical team. IMPRESSION: 1. Successful fluoroscopic insertion of a  18 Fr balloon retention gastrostomy tube for venting. The gastrostomy may be used immediately for medication administration and in 4 hrs for the initiation of feeds. 2. Successful ultrasound-guided diagnostic and therapeutic paracentesis yielding 3.6 L of peritoneal fluid. RECOMMENDATIONS: The patient will return to Vascular Interventional Radiology (VIR) for routine feeding tube evaluation and exchange in 6 months. Thom Hall, MD [facility] Radiology Electronically Signed   By: Thom Hall M.D.   On: 03/05/2024 16:28   IR Paracentesis Result Date: 03/05/2024 INDICATION: Venting gastrostomy. History of metastatic endometrial cancer with gastric outlet obstruction. EXAM: Procedures; 1. PERCUTANEOUS GASTROSTOMY TUBE PLACEMENT 2. ULTRASOUND-GUIDED DIAGNOSTIC AND THERAPEUTIC PARACENTESIS COMPARISON:  CT AP, 02/29/2024. MEDICATIONS: Ancef  2 gm IV; Antibiotics were administered within 1 hour of the procedure. CONTRAST:  20 mL mL of Isovue 300 administered into the gastric lumen. ANESTHESIA/SEDATION: Moderate (conscious) sedation was employed during this procedure. A total of Versed  2 mg and Fentanyl  100 mcg was administered intravenously. Moderate Sedation Time: 22 minutes. The patient's level of consciousness and vital signs were  monitored continuously by radiology nursing throughout the procedure under my direct supervision. FLUOROSCOPY TIME:  Radiation Exposure Index and estimated peak skin dose (PSD); Reference air kerma (RAK), 8.1 mGy. COMPLICATIONS: None immediate. PROCEDURE: Informed written consent was obtained from the patient and/or patient's representative following explanation of the procedure, risks, benefits and alternatives. A time out was performed prior to the initiation of the procedure. Maximal barrier sterile technique utilized including caps, mask, sterile gowns, sterile gloves, large sterile drape, hand hygiene and sterile prep. Initial ultrasound scanning demonstrates a moderate-to-large amount of ascites within the LEFT lower abdomen which was subsequently prepped and draped in the usual sterile fashion. 1% lidocaine  with epinephrine  was used for local anesthesia. An ultrasound image was saved for documentation purposed. An 8 Fr Safe-T-Centesis catheter was introduced. Paracentesis was performed. Attention was then directed to gastrostomy tube placement. The LEFT costal margin and barium opacified transverse colon were identified and avoided. Air was injected into the stomach for insufflation and visualization under fluoroscopy. Under sterile conditions and local anesthesia, 2 T tacks were utilized to pexy the anterior aspect of the stomach against the ventral abdominal wall. Contrast injection confirmed appropriate positioning of each of the T tacks. An incision was made between the T tacks and a 17 gauge trocar needle was utilized to access the stomach. Needle position was confirmed within the stomach with aspiration of air and injection of a small amount of contrast. A stiff guidewire was advanced into the gastric lumen and under intermittent fluoroscopic guidance, the access needle was exchanged for a telescoping peel-away sheath, ultimately allowing placement of a 18 Fr balloon retention gastrostomy tube. The  retention balloon was insufflated with a mixture of dilute saline and contrast and pulled taut against the anterior wall of the stomach. The external disc was cinched. Contrast injection confirms positioning within the stomach. Several spot radiographic images were obtained in various obliquities for documentation. The paracentesis catheter was removed and a dressing was applied. The patient tolerated the procedures well without immediate post procedural complication. The patient tolerated procedure well without immediate post procedural complication. FINDINGS: *After successful fluoroscopic guided placement, the gastrostomy tube is appropriately positioned with internal retention balloon against the ventral aspect of the gastric lumen. *A total of approximately 3.6 L of serous peritoneal fluid was removed. Samples were sent to the laboratory as requested by the clinical team. IMPRESSION: 1. Successful fluoroscopic insertion of a 18 Fr balloon  retention gastrostomy tube for venting. The gastrostomy may be used immediately for medication administration and in 4 hrs for the initiation of feeds. 2. Successful ultrasound-guided diagnostic and therapeutic paracentesis yielding 3.6 L of peritoneal fluid. RECOMMENDATIONS: The patient will return to Vascular Interventional Radiology (VIR) for routine feeding tube evaluation and exchange in 6 months. Thom Hall, MD [facility] Radiology Electronically Signed   By: Thom Hall M.D.   On: 03/05/2024 16:28   ECHOCARDIOGRAM COMPLETE Result Date: 03/04/2024    ECHOCARDIOGRAM REPORT   Patient Name:   LEATHER ESTIS Date of Exam: 03/03/2024 Medical Rec #:  [id number]    Height:       64.0 in Accession #:    [id number]   Weight:       180.8 lb Date of Birth:  [birth year]    BSA:          1.874 m Patient Age:    58 years     BP:           139/91 mmHg Patient Gender: F            HR:           89 bpm. Exam Location:  [facility] Procedure: 2D  Echo, Cardiac Doppler, Color Doppler and Intracardiac            Opacification Agent (Both Spectral and Color Flow Doppler were            utilized during procedure). Indications:     Abnormal ECG R94.31  History:         Patient has prior history of Echocardiogram examinations, most                  recent 07/24/2019.  Sonographer:     Thedora Louder RDCS, FASE Referring Phys:  [id number] SUMAYYA AMIN Diagnosing Phys: Redell Cave MD  Sonographer Comments: Technically difficult study due to poor echo windows and suboptimal apical window. Image acquisition challenging due to patient body habitus. IMPRESSIONS  1. Left ventricular ejection fraction, by estimation, is 60 to 65%. Left ventricular ejection fraction by 2D MOD biplane is 63.1 %. The left ventricle has normal function. The left ventricle has no regional wall motion abnormalities. Left ventricular diastolic parameters are consistent with Grade I diastolic dysfunction (impaired relaxation).  2. Right ventricular systolic function is normal. The right ventricular size is normal.  3. A small pericardial effusion is present.  4. The mitral valve is normal in structure. No evidence of mitral valve regurgitation.  5. The aortic valve is tricuspid. Aortic valve regurgitation is not visualized. Aortic valve sclerosis is present, with no evidence of aortic valve stenosis.  6. The inferior vena cava is normal in size with greater than 50% respiratory variability, suggesting right atrial pressure of 3 mmHg. FINDINGS  Left Ventricle: Left ventricular ejection fraction, by estimation, is 60 to 65%. Left ventricular ejection fraction by 2D MOD biplane is 63.1 %. The left ventricle has normal function. The left ventricle has no regional wall motion abnormalities. Definity  contrast agent was given IV to delineate the left ventricular endocardial borders. The left ventricular internal cavity size was normal in size. There is no left ventricular hypertrophy. Left  ventricular diastolic parameters are consistent with Grade I diastolic dysfunction (impaired relaxation). Right Ventricle: The right ventricular size is normal. No increase in right ventricular wall thickness. Right ventricular systolic function is normal. Left Atrium: Left atrial size was normal in size. Right Atrium: Right atrial  size was normal in size. Pericardium: A small pericardial effusion is present. Mitral Valve: The mitral valve is normal in structure. No evidence of mitral valve regurgitation. Tricuspid Valve: The tricuspid valve is normal in structure. Tricuspid valve regurgitation is not demonstrated. Aortic Valve: The aortic valve is tricuspid. Aortic valve regurgitation is not visualized. Aortic valve sclerosis is present, with no evidence of aortic valve stenosis. Aortic valve peak gradient measures 9.9 mmHg. Pulmonic Valve: The pulmonic valve was normal in structure. Pulmonic valve regurgitation is not visualized. Aorta: The aortic root is normal in size and structure. Venous: The inferior vena cava is normal in size with greater than 50% respiratory variability, suggesting right atrial pressure of 3 mmHg. IAS/Shunts: No atrial level shunt detected by color flow Doppler.  LEFT VENTRICLE PLAX 2D                        Biplane EF (MOD) LVIDd:         4.90 cm         LV Biplane EF:   Left LVIDs:         3.20 cm                          ventricular LV PW:         1.30 cm                          ejection LV IVS:        1.00 cm                          fraction by LVOT diam:     2.00 cm                          2D MOD LV SV:         53                               biplane is LV SV Index:   28                               63.1 %. LVOT Area:     3.14 cm                                Diastology                                LV e' medial:    6.31 cm/s LV Volumes (MOD)               LV E/e' medial:  8.1 LV vol d, MOD    47.7 ml       LV e' lateral:   11.40 cm/s A2C:                           LV E/e'  lateral: 4.5 LV vol d, MOD    60.3 ml A4C: LV vol s, MOD    18.7 ml A2C: LV vol s, MOD    18.9 ml  A4C: LV SV MOD A2C:   29.0 ml LV SV MOD A4C:   60.3 ml LV SV MOD BP:    33.7 ml RIGHT VENTRICLE RV Basal diam:  2.60 cm RV S prime:     12.80 cm/s TAPSE (M-mode): 1.6 cm LEFT ATRIUM           Index       RIGHT ATRIUM          Index LA diam:      2.90 cm 1.55 cm/m  RA Area:     7.58 cm LA Vol (A4C): 14.8 ml 7.90 ml/m  RA Volume:   11.80 ml 6.30 ml/m  AORTIC VALVE                 PULMONIC VALVE AV Area (Vmax): 1.77 cm     PV Vmax:        1.38 m/s AV Vmax:        157.00 cm/s  PV Peak grad:   7.6 mmHg AV Peak Grad:   9.9 mmHg     RVOT Peak grad: 7 mmHg LVOT Vmax:      88.40 cm/s LVOT Vmean:     59.900 cm/s LVOT VTI:       0.169 m  AORTA Ao Root diam: 3.30 cm Ao Asc diam:  2.90 cm MITRAL VALVE MV Area (PHT): 5.13 cm    SHUNTS MV Decel Time: 148 msec    Systemic VTI:  0.17 m MV E velocity: 51.00 cm/s  Systemic Diam: 2.00 cm MV A velocity: 69.20 cm/s MV E/A ratio:  0.74 Redell Cave MD Electronically signed by Redell Cave MD Signature Date/Time: 03/04/2024/12:14:06 PM    Final    US  EKG SITE RITE Result Date: 03/02/2024 If Site Rite image not attached, placement could not be confirmed due to current cardiac rhythm.  DG Abd Portable 1 View Result Date: 03/01/2024 CLINICAL DATA:  NG tube placement EXAM: PORTABLE ABDOMEN - 1 VIEW COMPARISON:  CT 02/29/2024 FINDINGS: NG tube tip is in the proximal to mid stomach. IMPRESSION: NG tube in the stomach. Electronically Signed   By: Franky Crease M.D.   On: 03/01/2024 01:42   CT ABDOMEN PELVIS W CONTRAST Result Date: 02/29/2024 CLINICAL DATA:  Nausea vomiting syncope EXAM: CT ABDOMEN AND PELVIS WITH CONTRAST TECHNIQUE: Multidetector CT imaging of the abdomen and pelvis was performed using the standard protocol following bolus administration of intravenous contrast. RADIATION DOSE REDUCTION: This exam was performed according to the departmental dose-optimization  program which includes automated exposure control, adjustment of the mA and/or kV according to patient size and/or use of iterative reconstruction technique. CONTRAST:  OMNIPAQUE  IOHEXOL  300 MG/ML  SOLN COMPARISON:  CT 02/23/2024, 11/25/2023 FINDINGS: Lower chest: Lung bases demonstrate no acute airspace disease. 6 mm right lower lobe anterior pulmonary nodule series 4, image 8 again noted. Coronary vascular calcification. Hepatobiliary: Hyperdense appearance of gallbladder, possible sludge. No calcified stone. No biliary dilatation Pancreas: Unremarkable. No pancreatic ductal dilatation or surrounding inflammatory changes. Spleen: Normal in size without focal abnormality. Adrenals/Urinary Tract: Stable adrenal glands. Mild left-sided hydronephrosis and hydroureter, slightly increased compared to prior, with more normal appearing caliber of distal left ureter in the pelvis, anterior to the iliac vessels. No obstructing stone. Decreased excretion of contrast on the left on delayed views consistent with obstruction. Nodular soft tissue thickening at the right posterior bladder, measuring about 35 x 16 mm on series 2, image 75, stable in the short interim. Stomach/Bowel: Interval marked fluid  distension of the stomach. Irregular wall thickening of the antral pyloric region with mucosal enhancement. Decompressed duodenum. Slightly low lying ligament of Treitz probably displaced by gastric distension. Elsewhere no dilated bowel is seen. 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 Vascular/Lymphatic: Moderate aortic atherosclerosis. As seen on recent prior, multiple enlarged abdominal, retroperitoneal and pelvic lymph nodes. Gastrohepatic nodes measuring up to 14 mm on series 2, image 27. Portal caval node measuring 21 mm on series 2, image 28. Index left iliac node measuring 22 mm on series 2, image 66, previously 21 mm. Reproductive: Stable appearance of the uterus and adnexal structures. Some asymmetrical soft  tissue thickening of the right vulvar and perineal soft tissues, series 2 image 84. Other: No free air. Generalized subcutaneous edema. Slight increased ascites within the abdomen and pelvis, now small-moderate volume. Peritoneal metastatic disease as evidenced by stranding and nodularity of the omentum. Musculoskeletal: No new lesion. High-grade compression deformity at L1 unchanged IMPRESSION: 1. Interval marked fluid distension of the stomach with irregular wall thickening of the antral pyloric region with mucosal enhancement with otherwise decompressed appearance of the bowel, outlet obstruction or functional obstruction of the stomach cannot be excluded. NG decompression is recommended 2. Slight increased ascites within the abdomen and pelvis, now small-moderate volume. No free air. 3. New mild left-sided hydronephrosis and hydroureter, with more normal appearing caliber of distal left ureter in the pelvis, anterior to the iliac vessels. No obstructing stone. Findings are consistent with distal ureteral obstruction. Decreased excretion of contrast from the left kidney on delayed views. 4. Nodular soft tissue thickening at the right posterior bladder, stable in the short interim but suspicious for neoplasm/metastatic disease. 5. Extensive abdominal, retroperitoneal and pelvic adenopathy consistent with metastatic disease. 6. 6 mm right lower lobe pulmonary nodule, stable, and also concerning for metastatic disease. 7. Aortic atherosclerosis. Aortic Atherosclerosis (ICD10-I70.0). Electronically Signed   By: Luke Bun M.D.   On: 02/29/2024 23:19   US  Venous Img Lower Unilateral Right Result Date: 02/29/2024 CLINICAL DATA:  Leg swelling for 1 day. Fell on Monday. History of uterine cancer. EXAM: Right LOWER EXTREMITY VENOUS DOPPLER ULTRASOUND TECHNIQUE: Gray-scale sonography with compression, as well as color and duplex ultrasound, were performed to evaluate the deep venous system(s) from the level of the  common femoral vein through the popliteal and proximal calf veins. COMPARISON:  None Available. FINDINGS: VENOUS Normal compressibility of the common femoral, superficial femoral, and popliteal veins, as well as the visualized calf veins. Visualized portions of profunda femoral vein and great saphenous vein unremarkable. No filling defects to suggest DVT on grayscale or color Doppler imaging. Doppler waveforms show normal direction of venous flow, normal respiratory plasticity and response to augmentation. Limited views of the contralateral common femoral vein are unremarkable. OTHER None. Limitations: none IMPRESSION: No evidence of acute deep venous thrombosis in the visualized lower extremity veins. Electronically Signed   By: Elsie Gravely M.D.   On: 02/29/2024 21:34   CT CHEST ABDOMEN PELVIS W CONTRAST Result Date: 02/24/2024 CLINICAL DATA:  Metastatic endometrial cancer, ongoing chemotherapy * Tracking Code: BO * EXAM: CT CHEST, ABDOMEN, AND PELVIS WITH CONTRAST TECHNIQUE: Multidetector CT imaging of the chest, abdomen and pelvis was performed following the standard protocol during bolus administration of intravenous contrast. RADIATION DOSE REDUCTION: This exam was performed according to the departmental dose-optimization program which includes automated exposure control, adjustment of the mA and/or kV according to patient size and/or use of iterative reconstruction technique. CONTRAST:  OMNIPAQUE  IOHEXOL  300 MG/ML  SOLN COMPARISON:  11/25/2023 FINDINGS: CT CHEST FINDINGS Cardiovascular: Right chest port catheter. Scattered aortic atherosclerosis. Normal heart size. Three-vessel coronary artery calcifications. No pericardial effusion. Mediastinum/Nodes: Newly enlarged left axillary and subpectoral lymph nodes measuring up to 2.1 x 1.6 cm (series 2, image 12). Thyroid  gland, trachea, and esophagus demonstrate no significant findings. Lungs/Pleura: Interval resolution of previously seen left pleural  effusion. Multiple new and enlarged bilateral pulmonary nodules, for example a 0.6 cm nodule of the anterior right lower lobe, previously no greater than 0.2 cm (series 3, image 88), and a new nodule in the posterior left upper lobe measuring 0.7 cm (series 3, image 54). Musculoskeletal: No chest wall abnormality. No acute osseous findings. CT ABDOMEN PELVIS FINDINGS Hepatobiliary: No solid liver abnormality is seen. No gallstones, gallbladder wall thickening, or biliary dilatation. Pancreas: Unremarkable. No pancreatic ductal dilatation or surrounding inflammatory changes. Spleen: Normal in size without significant abnormality. Adrenals/Urinary Tract: Adrenal glands are unremarkable. Kidneys are normal, without renal calculi, solid lesion, or hydronephrosis. New, enhancing nodular soft tissue along the posterior right aspect of the bladder dome measuring approximately 3.3 x 1.3 cm (series 2, image 101). Stomach/Bowel: Mucosal thickening and hyperenhancement of the stomach (series 2, image 61). Appendix appears normal. No evidence of bowel wall thickening, distention, or inflammatory changes. Vascular/Lymphatic: Aortic atherosclerosis. Interval enlargement of numerous abdominal and pelvic lymph nodes, involving the gastrohepatic ligament, celiac, porta hepatis, portacaval, retroperitoneal, bilateral iliac, and pelvic sidewall stations. Many lymph nodes are matted, particularly in the retroperitoneum and iliac stations. Largest index left iliac nodes measure up to 3.1 x 2.1 cm, previously on measurable (series 2, image 71). Largest portacaval nodes measure up to 2.9 x 1.9 cm, previously 1.3 x 0.9 cm (series 2, image 57). Reproductive: Matted appearance of the uterus and ovaries, similar to prior examination (series 2, image 92). Other: No abdominal wall hernia or abnormality. Small volume ascites throughout the abdomen and pelvis, similar in volume to prior examination. Interval increase in very fine peritoneal and  omental nodularity in the ventral abdomen (series 2, image 72). Musculoskeletal: No acute osseous findings. Unchanged high-grade wedge deformity of L1 (series 6, image 94). IMPRESSION: 1. Interval enlargement of numerous lymph nodes throughout the chest, abdomen, and pelvis, consistent with worsened nodal metastatic disease. 2. Multiple new and enlarged bilateral pulmonary nodules, consistent with pulmonary metastatic disease. 3. Small volume ascites throughout the abdomen pelvis, similar to prior examination. Interval increase in fine peritoneal and omental nodularity in the ventral abdomen as well as new, enhancing nodular soft tissue along the posterior right aspect of the bladder dome consistent with worsened peritoneal metastatic disease. 4. Matted post treatment appearance of the uterus and ovaries, similar to prior examination. 5. Mucosal thickening and hyperenhancement of the stomach, suggestive of nonspecific infectious or inflammatory gastritis. 6. Interval resolution of previously seen left pleural effusion. 7. Coronary artery disease. Aortic Atherosclerosis (ICD10-I70.0). Electronically Signed   By: Marolyn JONETTA Jaksch M.D.   On: 02/24/2024 16:28    Microbiology: No results found for this or any previous visit (from the past 240 hours).  Time spent: 30 minutes  Signed: Osamah Schmader, DO 2024-04-13

## 2024-03-23 NOTE — Progress Notes (Signed)
   April 05, 2024 1115  Spiritual Encounters  Type of Visit Initial  Care provided to: [facility] partners present during encounter Nurse  Referral source Nurse (RN/NT/LPN)  Reason for visit End-of-life  OnCall Visit Yes   Chaplain visited patient per request from the Unit for an end of life visit.  Chaplain offered a compassionate presence for the family.  Chaplain asked to hear about the patient's personality and things she loved in life.  Chaplain celebrated patient's life with the family and offered presence in their grief.  Chaplain offered to pay and family accepted.  Siblings saw Chaplain later on and asked about [facility] they can use since spouse had deferred to them to handle the cremation.  Secretary checked with Admin. and was told they don't offer a list but to click on some listed under [employer].  Family was grateful for the support.  Rev. Rana M. Nicholaus, M.Div. Chaplain Resident [facility]

## 2024-03-23 NOTE — Plan of Care (Signed)
   Problem: Coping: Goal: Level of anxiety will decrease Outcome: Progressing   Problem: Pain Managment: Goal: General experience of comfort will improve and/or be controlled Outcome: Progressing

## 2024-03-23 DEATH — deceased

## 2024-03-26 ENCOUNTER — Encounter: Payer: Self-pay | Admitting: Oncology

## 2024-04-02 ENCOUNTER — Ambulatory Visit

## 2024-04-02 ENCOUNTER — Ambulatory Visit: Admitting: Oncology

## 2024-04-02 ENCOUNTER — Other Ambulatory Visit

## 2024-04-03 ENCOUNTER — Ambulatory Visit

## 2024-04-03 ENCOUNTER — Ambulatory Visit: Admitting: Oncology

## 2024-04-03 ENCOUNTER — Other Ambulatory Visit

## 2024-04-04 ENCOUNTER — Ambulatory Visit

## 2024-04-04 ENCOUNTER — Ambulatory Visit: Admitting: Oncology

## 2024-04-05 ENCOUNTER — Ambulatory Visit

## 2024-04-06 ENCOUNTER — Ambulatory Visit

## 2024-04-09 ENCOUNTER — Ambulatory Visit

## 2024-04-13 ENCOUNTER — Other Ambulatory Visit (HOSPITAL_COMMUNITY): Payer: Self-pay
